# Patient Record
Sex: Female | Born: 1950 | Race: White | Hispanic: No | Marital: Married | Smoking: Former smoker
Health system: Southern US, Community
[De-identification: ages and names within clinical notes are randomized; demographics above are authoritative.]

## PROBLEM LIST (undated history)

## (undated) DIAGNOSIS — F419 Anxiety disorder, unspecified: Secondary | ICD-10-CM

## (undated) DIAGNOSIS — J449 Chronic obstructive pulmonary disease, unspecified: Secondary | ICD-10-CM

## (undated) DIAGNOSIS — R0602 Shortness of breath: Secondary | ICD-10-CM

## (undated) DIAGNOSIS — R0600 Dyspnea, unspecified: Secondary | ICD-10-CM

## (undated) DIAGNOSIS — K219 Gastro-esophageal reflux disease without esophagitis: Secondary | ICD-10-CM

## (undated) DIAGNOSIS — I509 Heart failure, unspecified: Secondary | ICD-10-CM

## (undated) DIAGNOSIS — I429 Cardiomyopathy, unspecified: Secondary | ICD-10-CM

## (undated) DIAGNOSIS — J45909 Unspecified asthma, uncomplicated: Secondary | ICD-10-CM

## (undated) DIAGNOSIS — M1990 Unspecified osteoarthritis, unspecified site: Secondary | ICD-10-CM

## (undated) DIAGNOSIS — C801 Malignant (primary) neoplasm, unspecified: Secondary | ICD-10-CM

## (undated) DIAGNOSIS — I493 Ventricular premature depolarization: Secondary | ICD-10-CM

## (undated) DIAGNOSIS — A419 Sepsis, unspecified organism: Secondary | ICD-10-CM

## (undated) DIAGNOSIS — F32A Depression, unspecified: Secondary | ICD-10-CM

## (undated) DIAGNOSIS — E785 Hyperlipidemia, unspecified: Secondary | ICD-10-CM

## (undated) DIAGNOSIS — C3490 Malignant neoplasm of unspecified part of unspecified bronchus or lung: Secondary | ICD-10-CM

## (undated) DIAGNOSIS — S24103A Unspecified injury at T7-T10 level of thoracic spinal cord, initial encounter: Secondary | ICD-10-CM

## (undated) DIAGNOSIS — F329 Major depressive disorder, single episode, unspecified: Secondary | ICD-10-CM

## (undated) DIAGNOSIS — I10 Essential (primary) hypertension: Secondary | ICD-10-CM

## (undated) DIAGNOSIS — J189 Pneumonia, unspecified organism: Secondary | ICD-10-CM

## (undated) HISTORY — DX: Unspecified injury at T7-t10 level of thoracic spinal cord, initial encounter: S24.103A

## (undated) HISTORY — DX: Malignant (primary) neoplasm, unspecified: C80.1

## (undated) HISTORY — PX: CATARACT EXTRACTION: SUR2

## (undated) HISTORY — DX: Essential (primary) hypertension: I10

## (undated) HISTORY — PX: LAMINOTOMY: SHX998

## (undated) HISTORY — DX: Ventricular premature depolarization: I49.3

## (undated) HISTORY — DX: Hyperlipidemia, unspecified: E78.5

## (undated) HISTORY — DX: Cardiomyopathy, unspecified: I42.9

## (undated) HISTORY — DX: Pneumonia, unspecified organism: J18.9

## (undated) HISTORY — DX: Unspecified asthma, uncomplicated: J45.909

## (undated) HISTORY — DX: Shortness of breath: R06.02

## (undated) HISTORY — DX: Sepsis, unspecified organism: A41.9

## (undated) HISTORY — DX: Chronic obstructive pulmonary disease, unspecified: J44.9

## (undated) HISTORY — PX: APPENDECTOMY: SHX54

## (undated) HISTORY — PX: CARDIAC CATHETERIZATION: SHX172

## (undated) HISTORY — PX: COLONOSCOPY: SHX174

---

## 2004-01-01 ENCOUNTER — Ambulatory Visit: Payer: Self-pay | Admitting: Physician Assistant

## 2004-05-07 ENCOUNTER — Ambulatory Visit: Payer: Self-pay | Admitting: Physician Assistant

## 2004-09-01 ENCOUNTER — Ambulatory Visit: Payer: Self-pay | Admitting: Physician Assistant

## 2005-01-05 ENCOUNTER — Ambulatory Visit: Payer: Self-pay | Admitting: Physician Assistant

## 2005-05-10 ENCOUNTER — Ambulatory Visit: Payer: Self-pay | Admitting: Physician Assistant

## 2005-08-12 ENCOUNTER — Ambulatory Visit: Payer: Self-pay | Admitting: Physician Assistant

## 2005-11-08 ENCOUNTER — Ambulatory Visit: Payer: Self-pay | Admitting: Physician Assistant

## 2006-01-04 ENCOUNTER — Ambulatory Visit: Payer: Self-pay | Admitting: Pain Medicine

## 2006-05-04 ENCOUNTER — Ambulatory Visit: Payer: Self-pay | Admitting: Physician Assistant

## 2006-06-06 ENCOUNTER — Ambulatory Visit: Payer: Self-pay | Admitting: Pain Medicine

## 2006-08-07 ENCOUNTER — Ambulatory Visit: Payer: Self-pay | Admitting: Physician Assistant

## 2006-09-07 ENCOUNTER — Ambulatory Visit: Payer: Self-pay | Admitting: Pain Medicine

## 2006-09-26 ENCOUNTER — Ambulatory Visit: Payer: Self-pay | Admitting: Physician Assistant

## 2006-11-06 ENCOUNTER — Ambulatory Visit: Payer: Self-pay | Admitting: Physician Assistant

## 2007-02-12 ENCOUNTER — Ambulatory Visit: Payer: Self-pay | Admitting: Physician Assistant

## 2007-05-15 ENCOUNTER — Ambulatory Visit: Payer: Self-pay | Admitting: Physician Assistant

## 2007-05-31 ENCOUNTER — Ambulatory Visit: Payer: Self-pay | Admitting: Pain Medicine

## 2007-06-27 ENCOUNTER — Ambulatory Visit: Payer: Self-pay | Admitting: Pain Medicine

## 2007-09-13 ENCOUNTER — Ambulatory Visit: Payer: Self-pay | Admitting: Physician Assistant

## 2007-12-12 ENCOUNTER — Ambulatory Visit: Payer: Self-pay | Admitting: Physician Assistant

## 2008-03-12 ENCOUNTER — Ambulatory Visit: Payer: Self-pay | Admitting: Physician Assistant

## 2008-06-17 ENCOUNTER — Ambulatory Visit: Payer: Self-pay | Admitting: Physician Assistant

## 2008-08-20 ENCOUNTER — Ambulatory Visit: Payer: Self-pay | Admitting: Gastroenterology

## 2008-09-16 ENCOUNTER — Ambulatory Visit: Payer: Self-pay | Admitting: Gastroenterology

## 2008-09-17 ENCOUNTER — Ambulatory Visit: Payer: Self-pay | Admitting: Physician Assistant

## 2008-10-23 ENCOUNTER — Ambulatory Visit: Payer: Self-pay | Admitting: Physician Assistant

## 2009-01-13 ENCOUNTER — Ambulatory Visit: Payer: Self-pay | Admitting: Family Medicine

## 2009-01-26 ENCOUNTER — Ambulatory Visit: Payer: Self-pay | Admitting: Family Medicine

## 2009-03-17 ENCOUNTER — Ambulatory Visit: Payer: Self-pay | Admitting: Cardiothoracic Surgery

## 2009-03-18 ENCOUNTER — Ambulatory Visit: Payer: Self-pay | Admitting: Cardiothoracic Surgery

## 2009-09-23 ENCOUNTER — Ambulatory Visit: Payer: Self-pay | Admitting: Pain Medicine

## 2009-10-01 ENCOUNTER — Ambulatory Visit: Payer: Self-pay | Admitting: Pain Medicine

## 2009-10-29 ENCOUNTER — Ambulatory Visit: Payer: Self-pay | Admitting: Pain Medicine

## 2009-11-04 ENCOUNTER — Ambulatory Visit: Payer: Self-pay | Admitting: Specialist

## 2009-11-05 ENCOUNTER — Ambulatory Visit: Payer: Self-pay | Admitting: Pain Medicine

## 2009-11-18 ENCOUNTER — Ambulatory Visit: Payer: Self-pay | Admitting: Pain Medicine

## 2010-03-10 ENCOUNTER — Ambulatory Visit: Payer: Self-pay | Admitting: Pain Medicine

## 2010-04-13 ENCOUNTER — Ambulatory Visit: Payer: Self-pay | Admitting: Pain Medicine

## 2010-05-25 ENCOUNTER — Other Ambulatory Visit: Payer: Self-pay | Admitting: Rheumatology

## 2010-09-21 ENCOUNTER — Ambulatory Visit: Payer: Self-pay | Admitting: Pain Medicine

## 2010-10-11 ENCOUNTER — Ambulatory Visit: Payer: Self-pay | Admitting: Pain Medicine

## 2011-06-29 ENCOUNTER — Ambulatory Visit: Payer: Self-pay | Admitting: Pain Medicine

## 2011-06-30 ENCOUNTER — Ambulatory Visit: Payer: Self-pay | Admitting: Pain Medicine

## 2011-08-05 ENCOUNTER — Other Ambulatory Visit: Payer: Self-pay | Admitting: Rheumatology

## 2011-08-05 LAB — SYNOVIAL CELL COUNT + DIFF, W/ CRYSTALS
Eosinophil: 0 %
Lymphocytes: 17 %
Neutrophils: 2 %
Nucleated Cell Count: 206 /mm3
Other Mononuclear Cells: 81 %

## 2012-11-26 ENCOUNTER — Ambulatory Visit: Payer: Self-pay | Admitting: Pain Medicine

## 2012-12-06 ENCOUNTER — Ambulatory Visit: Payer: Self-pay | Admitting: Pain Medicine

## 2012-12-31 ENCOUNTER — Ambulatory Visit: Payer: Self-pay | Admitting: Pain Medicine

## 2013-05-10 ENCOUNTER — Ambulatory Visit: Payer: Self-pay | Admitting: Ophthalmology

## 2013-05-10 DIAGNOSIS — J449 Chronic obstructive pulmonary disease, unspecified: Secondary | ICD-10-CM

## 2013-05-10 DIAGNOSIS — I428 Other cardiomyopathies: Secondary | ICD-10-CM

## 2013-05-22 ENCOUNTER — Ambulatory Visit: Payer: Self-pay | Admitting: Ophthalmology

## 2013-07-22 ENCOUNTER — Other Ambulatory Visit: Payer: Self-pay | Admitting: Pediatrics

## 2013-10-28 ENCOUNTER — Ambulatory Visit: Payer: Self-pay | Admitting: Pain Medicine

## 2013-10-31 ENCOUNTER — Ambulatory Visit: Payer: Self-pay | Admitting: Pain Medicine

## 2013-11-25 ENCOUNTER — Ambulatory Visit: Payer: Self-pay | Admitting: Pain Medicine

## 2014-02-21 ENCOUNTER — Emergency Department: Payer: Self-pay | Admitting: Emergency Medicine

## 2014-02-21 LAB — COMPREHENSIVE METABOLIC PANEL
ANION GAP: 11 (ref 7–16)
AST: 29 U/L (ref 15–37)
Albumin: 4 g/dL (ref 3.4–5.0)
Alkaline Phosphatase: 110 U/L
BILIRUBIN TOTAL: 0.4 mg/dL (ref 0.2–1.0)
BUN: 11 mg/dL (ref 7–18)
CHLORIDE: 106 mmol/L (ref 98–107)
Calcium, Total: 9.2 mg/dL (ref 8.5–10.1)
Co2: 21 mmol/L (ref 21–32)
Creatinine: 0.89 mg/dL (ref 0.60–1.30)
EGFR (African American): >60
EGFR (Non-African Amer.): >60
Glucose: 121 mg/dL — ABNORMAL HIGH (ref 65–99)
OSMOLALITY: 276 (ref 275–301)
POTASSIUM: 3.7 mmol/L (ref 3.5–5.1)
SGPT (ALT): 38 U/L
Sodium: 138 mmol/L (ref 136–145)
TOTAL PROTEIN: 7.8 g/dL (ref 6.4–8.2)

## 2014-02-21 LAB — CBC WITH DIFFERENTIAL/PLATELET
Basophil #: 0.1 10*3/uL (ref 0.0–0.1)
Basophil %: 0.6 %
EOS ABS: 0.1 10*3/uL (ref 0.0–0.7)
Eosinophil %: 0.6 %
HCT: 45.5 % (ref 35.0–47.0)
HGB: 15.3 g/dL (ref 12.0–16.0)
LYMPHS PCT: 24.5 %
Lymphocyte #: 2.7 10*3/uL (ref 1.0–3.6)
MCH: 31.5 pg (ref 26.0–34.0)
MCHC: 33.6 g/dL (ref 32.0–36.0)
MCV: 94 fL (ref 80–100)
MONO ABS: 0.7 x10 3/mm (ref 0.2–0.9)
Monocyte %: 6.5 %
NEUTROS ABS: 7.3 10*3/uL — AB (ref 1.4–6.5)
Neutrophil %: 67.8 %
Platelet: 247 10*3/uL (ref 150–440)
RBC: 4.84 10*6/uL (ref 3.80–5.20)
RDW: 13.5 % (ref 11.5–14.5)
WBC: 10.8 10*3/uL (ref 3.6–11.0)

## 2014-02-21 LAB — URINALYSIS, COMPLETE
BILIRUBIN, UR: NEGATIVE
Blood: NEGATIVE
Glucose,UR: NEGATIVE mg/dL (ref 0–75)
Leukocyte Esterase: NEGATIVE
Nitrite: NEGATIVE
Ph: 5 (ref 4.5–8.0)
Protein: NEGATIVE
SPECIFIC GRAVITY: 1.024 (ref 1.003–1.030)

## 2014-02-21 LAB — LIPASE, BLOOD: LIPASE: 225 U/L (ref 73–393)

## 2014-03-13 ENCOUNTER — Other Ambulatory Visit (HOSPITAL_COMMUNITY): Payer: Self-pay | Admitting: Cardiology

## 2014-03-13 DIAGNOSIS — R000 Tachycardia, unspecified: Secondary | ICD-10-CM

## 2014-03-13 DIAGNOSIS — R0602 Shortness of breath: Secondary | ICD-10-CM

## 2014-03-20 ENCOUNTER — Other Ambulatory Visit: Payer: 59

## 2014-03-31 ENCOUNTER — Ambulatory Visit: Payer: 59 | Admitting: Cardiovascular Disease

## 2014-04-03 ENCOUNTER — Other Ambulatory Visit: Payer: 59

## 2014-04-07 ENCOUNTER — Ambulatory Visit: Payer: 59 | Admitting: Cardiovascular Disease

## 2014-05-08 ENCOUNTER — Ambulatory Visit (INDEPENDENT_AMBULATORY_CARE_PROVIDER_SITE_OTHER): Payer: 59 | Admitting: Cardiovascular Disease

## 2014-05-08 ENCOUNTER — Encounter: Payer: Self-pay | Admitting: Cardiovascular Disease

## 2014-05-08 ENCOUNTER — Encounter (INDEPENDENT_AMBULATORY_CARE_PROVIDER_SITE_OTHER): Payer: Self-pay

## 2014-05-08 VITALS — BP 120/70 | HR 67 | Ht 65.0 in | Wt 172.5 lb

## 2014-05-08 DIAGNOSIS — I420 Dilated cardiomyopathy: Secondary | ICD-10-CM | POA: Insufficient documentation

## 2014-05-08 DIAGNOSIS — I493 Ventricular premature depolarization: Secondary | ICD-10-CM | POA: Insufficient documentation

## 2014-05-08 DIAGNOSIS — R0602 Shortness of breath: Secondary | ICD-10-CM

## 2014-05-08 DIAGNOSIS — J449 Chronic obstructive pulmonary disease, unspecified: Secondary | ICD-10-CM | POA: Diagnosis not present

## 2014-05-08 DIAGNOSIS — E669 Obesity, unspecified: Secondary | ICD-10-CM | POA: Diagnosis not present

## 2014-05-08 DIAGNOSIS — R000 Tachycardia, unspecified: Secondary | ICD-10-CM | POA: Insufficient documentation

## 2014-05-08 HISTORY — DX: Shortness of breath: R06.02

## 2014-05-08 MED ORDER — METOPROLOL SUCCINATE ER 100 MG PO TB24: 200.0000 mg | ORAL_TABLET | Freq: Every day | ORAL | Status: DC

## 2014-05-08 NOTE — Assessment & Plan Note (Signed)
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 Uncertain if alcohol was playing a factor in her low ejection fraction at the time. Repeat echocardiogram has been ordered

## 2014-05-08 NOTE — Assessment & Plan Note (Signed)
She has underlying COPD from long history of smoking. She has nebulizers and inhalers. Resolving bronchitis on today's visit

## 2014-05-08 NOTE — Patient Instructions (Addendum)
You are doing well.  Please call when you would like to schedule the stress test  Neb treatments for shortness of breath  Keep the echo appt on Monday  Please call us if you have new issues that need to be addressed before your next appt.  Your physician wants you to follow-up in: 6 months.  You will receive a reminder letter in the mail two months in advance. If you don't receive a letter, please call our office to schedule the follow-up appointment.  [place]  Your caregiver has ordered a Stress Test with nuclear imaging. The purpose of this test is to evaluate the blood supply to your heart muscle. This procedure is referred to as a "Non-Invasive Stress Test." This is because other than having an IV started in your vein, nothing is inserted or "invades" your body. Cardiac stress tests are done to find areas of poor blood flow to the heart by determining the extent of coronary artery disease (CAD). Some patients exercise on a treadmill, which naturally increases the blood flow to your heart, while others who are  unable to walk on a treadmill due to physical limitations have a pharmacologic/chemical stress agent called Lexiscan . This medicine will mimic walking on a treadmill by temporarily increasing your coronary blood flow.   Please note: these test may take anywhere between 2-4 hours to complete  PLEASE REPORT TO [facility] AT THE FIRST DESK WILL DIRECT YOU WHERE TO GO  Date of Procedure:_____________________________________  Arrival Time for Procedure:______________________________  Instructions regarding medication:   __X__:  Hold betablocker(s) night before procedure and morning of procedure: METOPROLOL  PLEASE NOTIFY THE OFFICE AT LEAST 24 HOURS IN ADVANCE IF YOU ARE UNABLE TO KEEP YOUR APPOINTMENT.  [phone] AND  PLEASE NOTIFY NUCLEAR MEDICINE AT [facility] AT LEAST 24 HOURS IN ADVANCE IF YOU ARE UNABLE TO KEEP YOUR APPOINTMENT.  [phone]  How to prepare for your Myoview test:  1. Do not eat or drink after midnight 2. No caffeine for 24 hours prior to test 3. No smoking 24 hours prior to test. 4. Your medication may be taken with water.  If your doctor stopped a medication because of this test, do not take that medication. 5. Ladies, please do not wear dresses.  Skirts or pants are appropriate. Please wear a short sleeve shirt. 6. No perfume, cologne or lotion. 7. Wear comfortable walking shoes. No heels!

## 2014-05-08 NOTE — Assessment & Plan Note (Signed)
Etiology of her shortness of breath is likely multifactorial. She has underlying COPD, resolving bronchitis with rails appreciated today, obesity, deconditioning. Unable to exclude systolic dysfunction, cardiomyopathy. Also unable to rule out ischemia given her long smoking history. This was discussed with her in detail. She has echocardiogram scheduled for several days from now to evaluate ejection fraction, right heart pressures. We will also schedule a pharmacologic Myoview to rule out ischemia given her long smoking history. 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

## 2014-05-08 NOTE — Progress Notes (Signed)
Patient ID: Grace [name], female    DOB: 1950-10-12, 64 y.o.   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 Records are not available at this time. She is concerned as she is been having worsening shortness of breath.  She has had 2 rounds of bronchitis one in November 2015, again February 2016 both treated with antibiotics and prednisone. Symptoms seem to have resolved but she reports having significant shortness of breath walking into the office today, had to stop twice.  She does report having baseline tachycardia with heart rates 120. She has been slowly increasing the metoprolol dose on her own, now takes 300 mg daily. She reports this does cause fatigue. She has had 30 pound weight gain since November 2015, possibly from inactivity, illness, prednisone. She does not think she has fluid retention.  She continues to smoke one half pack per day, has smoked for several decades. She does have a cough, upper respiratory, nonproductive.  EKG on today's visit shows normal sinus rhythm with rate 67 bpm, nonspecific ST abnormality    Allergies  Allergen Reactions  . Iodine Anaphylaxis    Outpatient Encounter Prescriptions as of 05/08/2014  Medication Sig  . albuterol (PROVENTIL HFA;VENTOLIN HFA) 108 (90 BASE) MCG/ACT inhaler Inhale 2 puffs into the lungs every 4 (four) hours as needed.   . ARIPiprazole (ABILIFY) 5 MG tablet Take 5 mg by mouth daily.  . DULoxetine (CYMBALTA) 60 MG capsule TAKE ONE CAPSULE BY MOUTH DAILY  . Fluticasone-Salmeterol (ADVAIR) 100-50 MCG/DOSE AEPB Inhale 1 puff into the lungs as needed.  . Ipratropium-Albuterol (COMBIVENT) 20-100 MCG/ACT AERS respimat Inhale 1 puff into the  lungs every 6 (six) hours as needed.   . metoprolol succinate (TOPROL-XL) 100 MG 24 hr tablet Take 2 tablets (200 mg total) by mouth daily.  . pantoprazole (PROTONIX) 40 MG tablet Take 40 mg by mouth 2 (two) times daily.   . [DISCONTINUED] metoprolol succinate (TOPROL-XL) 100 MG 24 hr tablet Take 300 mg by mouth daily.   . [DISCONTINUED] Armodafinil 150 MG tablet Take 150 mg by mouth daily.     Past Medical History  Diagnosis Date  . Cardiomyopathy   . Hypertension   . Hyperlipidemia   . Asthma   . COPD (chronic obstructive pulmonary disease)   . PVC's (premature ventricular contractions)     Past Surgical History  Procedure Laterality Date  . Cardiac catheterization    . Cesarean section    . Appendectomy    . Cataract extraction      right eye     Social History  reports that she has been smoking Cigarettes.  She has a 25 pack-year smoking history. She does not have any smokeless tobacco history on file. She reports that she does not drink alcohol or use illicit drugs.  Family History family history includes Heart attack (age of onset: 57) in her father.  Review of Systems  Constitutional: Negative.   HENT: Negative.   Respiratory: Positive for cough and shortness of breath.   Cardiovascular: Negative.   Gastrointestinal: Negative.   Musculoskeletal: Negative.   Skin: Negative.   Neurological: Negative.   Hematological: Negative.   Psychiatric/Behavioral: Negative.   All other systems reviewed and are negative.  BP 120/70 mmHg  Pulse 67  Ht 5\' 5"  (1.651 m)  Wt 172 lb 8 oz (78.245 kg)  BMI 28.71 kg/m2  Physical Exam  Constitutional: She is oriented to person, place, and time. She appears well-developed and well-nourished.  HENT:  Head: Normocephalic.  Nose: Nose normal.  Mouth/Throat: Oropharynx is clear and moist.  Eyes: Conjunctivae are normal. Pupils are equal, round, and reactive to light.  Neck: Normal range of motion. Neck supple. No JVD present.   Cardiovascular: Normal rate, regular rhythm, S1 normal, S2 normal, normal heart sounds and intact distal pulses.  Exam reveals no gallop and no friction rub.   No murmur heard. Pulmonary/Chest: Effort normal. No respiratory distress. She has wheezes. She has rales. She exhibits no tenderness.  Abdominal: Soft. Bowel sounds are normal. She exhibits no distension. There is no tenderness.  Musculoskeletal: Normal range of motion. She exhibits no edema or tenderness.  Lymphadenopathy:    She has no cervical adenopathy.  Neurological: She is alert and oriented to person, place, and time. Coordination normal.  Skin: Skin is warm and dry. No rash noted. No erythema.  Psychiatric: She has a normal mood and affect. Her behavior is normal. Judgment and thought content normal.    Assessment and Plan  Nursing note and vitals reviewed.

## 2014-05-08 NOTE — Assessment & Plan Note (Signed)
Recent 30 pound weight gain from inactivity and prednisone. We have encouraged continued exercise, careful diet management in an effort to lose weight.

## 2014-05-08 NOTE — Assessment & Plan Note (Signed)
She is on metoprolol succinate 300 mg daily. Suggested she decrease this back to 200 mg daily as there is likely little benefit of additional medication above this does. She reports that she feels better, breathing is better on higher dose metoprolol when heart rate is slow. At baseline has sinus tachycardia with heart rate more than 100 contribute to shortness of breath.

## 2014-05-12 ENCOUNTER — Other Ambulatory Visit: Payer: 59

## 2014-06-07 NOTE — Op Note (Signed)
PATIENT NAME:  Grace [name], Grace [name] MR#:  [id number] DATE OF BIRTH:  01-23-1951  DATE OF PROCEDURE:  05/22/2013  PREOPERATIVE DIAGNOSIS:  Senile cataract right eye.  POSTOPERATIVE DIAGNOSIS:  Senile cataract right eye.  PROCEDURE:  Phacoemulsification with posterior chamber intraocular lens implantation of the right eye.  LENS:  ZCB00 23.5-diopter posterior chamber intraocular lens.  ULTRASOUND TIME:  21% of 1 minute, 16 seconds.  CDE 16.2  SURGEON:  Mali Eyonna Sandstrom, MD  ANESTHESIA:  Topical with tetracaine drops and 2% Xylocaine jelly.  COMPLICATIONS:  None.  DESCRIPTION OF PROCEDURE:  The patient was identified in the holding room and transported to the operating room and placed in the supine position under the operating microscope.  The right eye was identified as the operative eye and it was prepped and draped in the usual sterile ophthalmic fashion.  A 1 millimeter clear-corneal paracentesis was made at the 12 o'clock position.  The anterior chamber was filled with Viscoat viscoelastic.  A 2.4 millimeter keratome was used to make a near-clear corneal incision at the 9 o'clock position.  A curvilinear capsulorrhexis was made with a cystotome and capsulorrhexis forceps.  Balanced salt solution was used to hydrodissect and hydrodelineate the nucleus.  Phacoemulsification was then used in stop and chop fashion to remove the lens nucleus and epinucleus.  The remaining cortex was then removed using the irrigation and aspiration handpiece. Provisc was then placed into the capsular bag to distend it for lens placement.  A ZCB00 23.5-diopter lens was then injected into the capsular bag.  The remaining viscoelastic was aspirated.  Wounds were hydrated with balanced salt solution.  The anterior chamber was inflated to a physiologic pressure with balanced salt solution.  0.1 mL of cefuroxime 10 mg/mL were injected into the anterior chamber for a dose of 1 mg of intracameral antibiotic at the completion  of the case. Miostat was placed into the anterior chamber to constrict the pupil.  No wound leaks were noted.  Topical Vigamox drops and Maxitrol ointment were applied to the eye.  The patient was taken to the recovery room in stable condition without complications of anesthesia or surgery.  ____________________________ Wyonia Hough, MD crb:lb D: 05/22/2013 13:47:22 ET T: 05/22/2013 14:04:42 ET JOB#: [id number]  cc: Wyonia Hough, MD, <Dictator> Leandrew Koyanagi MD ELECTRONICALLY SIGNED 05/29/2013 14:51

## 2014-06-30 ENCOUNTER — Emergency Department: Payer: 59

## 2014-06-30 ENCOUNTER — Inpatient Hospital Stay
Admission: EM | Admit: 2014-06-30 | Discharge: 2014-07-02 | DRG: 871 | Disposition: A | Discharge status: Home / Self Care | Payer: 59 | Attending: Internal Medicine | Admitting: Internal Medicine

## 2014-06-30 DIAGNOSIS — J40 Bronchitis, not specified as acute or chronic: Secondary | ICD-10-CM | POA: Diagnosis present

## 2014-06-30 DIAGNOSIS — Z791 Long term (current) use of non-steroidal anti-inflammatories (NSAID): Secondary | ICD-10-CM

## 2014-06-30 DIAGNOSIS — K219 Gastro-esophageal reflux disease without esophagitis: Secondary | ICD-10-CM | POA: Diagnosis present

## 2014-06-30 DIAGNOSIS — G629 Polyneuropathy, unspecified: Secondary | ICD-10-CM | POA: Diagnosis present

## 2014-06-30 DIAGNOSIS — J440 Chronic obstructive pulmonary disease with acute lower respiratory infection: Secondary | ICD-10-CM | POA: Diagnosis present

## 2014-06-30 DIAGNOSIS — F329 Major depressive disorder, single episode, unspecified: Secondary | ICD-10-CM | POA: Diagnosis present

## 2014-06-30 DIAGNOSIS — J189 Pneumonia, unspecified organism: Secondary | ICD-10-CM

## 2014-06-30 DIAGNOSIS — E785 Hyperlipidemia, unspecified: Secondary | ICD-10-CM | POA: Diagnosis present

## 2014-06-30 DIAGNOSIS — I10 Essential (primary) hypertension: Secondary | ICD-10-CM | POA: Diagnosis present

## 2014-06-30 DIAGNOSIS — M549 Dorsalgia, unspecified: Secondary | ICD-10-CM | POA: Diagnosis present

## 2014-06-30 DIAGNOSIS — Z7952 Long term (current) use of systemic steroids: Secondary | ICD-10-CM

## 2014-06-30 DIAGNOSIS — A419 Sepsis, unspecified organism: Principal | ICD-10-CM | POA: Diagnosis present

## 2014-06-30 DIAGNOSIS — F419 Anxiety disorder, unspecified: Secondary | ICD-10-CM | POA: Diagnosis present

## 2014-06-30 DIAGNOSIS — I429 Cardiomyopathy, unspecified: Secondary | ICD-10-CM | POA: Diagnosis present

## 2014-06-30 DIAGNOSIS — J449 Chronic obstructive pulmonary disease, unspecified: Secondary | ICD-10-CM | POA: Diagnosis present

## 2014-06-30 DIAGNOSIS — F17210 Nicotine dependence, cigarettes, uncomplicated: Secondary | ICD-10-CM | POA: Diagnosis present

## 2014-06-30 DIAGNOSIS — J441 Chronic obstructive pulmonary disease with (acute) exacerbation: Secondary | ICD-10-CM | POA: Diagnosis present

## 2014-06-30 DIAGNOSIS — F32A Depression, unspecified: Secondary | ICD-10-CM | POA: Diagnosis present

## 2014-06-30 DIAGNOSIS — Z79899 Other long term (current) drug therapy: Secondary | ICD-10-CM

## 2014-06-30 DIAGNOSIS — Z7951 Long term (current) use of inhaled steroids: Secondary | ICD-10-CM | POA: Diagnosis not present

## 2014-06-30 DIAGNOSIS — G8929 Other chronic pain: Secondary | ICD-10-CM | POA: Diagnosis present

## 2014-06-30 DIAGNOSIS — R001 Bradycardia, unspecified: Secondary | ICD-10-CM | POA: Diagnosis present

## 2014-06-30 DIAGNOSIS — R52 Pain, unspecified: Secondary | ICD-10-CM

## 2014-06-30 HISTORY — DX: Pneumonia, unspecified organism: J18.9

## 2014-06-30 HISTORY — DX: Sepsis, unspecified organism: A41.9

## 2014-06-30 LAB — COMPREHENSIVE METABOLIC PANEL
ALT: 31 U/L (ref 14–54)
ANION GAP: 10 (ref 5–15)
AST: 25 U/L (ref 15–41)
Albumin: 3.8 g/dL (ref 3.5–5.0)
Alkaline Phosphatase: 59 U/L (ref 38–126)
BILIRUBIN TOTAL: 0.5 mg/dL (ref 0.3–1.2)
BUN: 17 mg/dL (ref 6–20)
CO2: 30 mmol/L (ref 22–32)
CREATININE: 0.73 mg/dL (ref 0.44–1.00)
Calcium: 9 mg/dL (ref 8.9–10.3)
Chloride: 102 mmol/L (ref 101–111)
GFR calc non Af Amer: >60 mL/min (ref >60)
GLUCOSE: 106 mg/dL — AB (ref 65–99)
POTASSIUM: 4 mmol/L (ref 3.5–5.1)
Sodium: 142 mmol/L (ref 135–145)
TOTAL PROTEIN: 6.5 g/dL (ref 6.5–8.1)

## 2014-06-30 LAB — CBC WITH DIFFERENTIAL/PLATELET
Basophils Absolute: 0 10*3/uL (ref 0–0.1)
Basophils Relative: 0 %
EOS ABS: 0 10*3/uL (ref 0–0.7)
Eosinophils Relative: 0 %
HCT: 44.4 % (ref 35.0–47.0)
Hemoglobin: 15 g/dL (ref 12.0–16.0)
LYMPHS ABS: 1.1 10*3/uL (ref 1.0–3.6)
LYMPHS PCT: 7 %
MCH: 31.9 pg (ref 26.0–34.0)
MCHC: 33.8 g/dL (ref 32.0–36.0)
MCV: 94.3 fL (ref 80.0–100.0)
MONOS PCT: 4 %
Monocytes Absolute: 0.6 10*3/uL (ref 0.2–0.9)
NEUTROS PCT: 89 %
Neutro Abs: 14.4 10*3/uL — ABNORMAL HIGH (ref 1.4–6.5)
PLATELETS: 233 10*3/uL (ref 150–440)
RBC: 4.71 MIL/uL (ref 3.80–5.20)
RDW: 14.1 % (ref 11.5–14.5)
WBC: 16.2 10*3/uL — ABNORMAL HIGH (ref 3.6–11.0)

## 2014-06-30 LAB — URINALYSIS COMPLETE WITH MICROSCOPIC (ARMC ONLY)
Bilirubin Urine: NEGATIVE
Glucose, UA: NEGATIVE mg/dL
Hgb urine dipstick: NEGATIVE
Ketones, ur: NEGATIVE mg/dL
Leukocytes, UA: NEGATIVE
Nitrite: NEGATIVE
PH: 6 (ref 5.0–8.0)
PROTEIN: NEGATIVE mg/dL
RBC / HPF: NONE SEEN RBC/hpf (ref 0–5)
Specific Gravity, Urine: 1.008 (ref 1.005–1.030)

## 2014-06-30 LAB — EXPECTORATED SPUTUM ASSESSMENT W GRAM STAIN, RFLX TO RESP C

## 2014-06-30 LAB — CREATININE, SERUM
CREATININE: 0.81 mg/dL (ref 0.44–1.00)
GFR calc Af Amer: >60 mL/min (ref >60)
GFR calc non Af Amer: >60 mL/min (ref >60)

## 2014-06-30 LAB — BASIC METABOLIC PANEL
ANION GAP: 9 (ref 5–15)
BUN: 16 mg/dL (ref 6–20)
CALCIUM: 8.5 mg/dL — AB (ref 8.9–10.3)
CHLORIDE: 100 mmol/L — AB (ref 101–111)
CO2: 29 mmol/L (ref 22–32)
Creatinine, Ser: 0.8 mg/dL (ref 0.44–1.00)
GFR calc Af Amer: >60 mL/min (ref >60)
GFR calc non Af Amer: >60 mL/min (ref >60)
GLUCOSE: 314 mg/dL — AB (ref 65–99)
Potassium: 3.9 mmol/L (ref 3.5–5.1)
Sodium: 138 mmol/L (ref 135–145)

## 2014-06-30 LAB — CBC
HCT: 42.5 % (ref 35.0–47.0)
HEMOGLOBIN: 13.8 g/dL (ref 12.0–16.0)
MCH: 31 pg (ref 26.0–34.0)
MCHC: 32.5 g/dL (ref 32.0–36.0)
MCV: 95.6 fL (ref 80.0–100.0)
PLATELETS: 208 10*3/uL (ref 150–440)
RBC: 4.44 MIL/uL (ref 3.80–5.20)
RDW: 14.4 % (ref 11.5–14.5)
WBC: 14 10*3/uL — ABNORMAL HIGH (ref 3.6–11.0)

## 2014-06-30 LAB — BLOOD GAS, VENOUS
Acid-Base Excess: 8.2 mmol/L — ABNORMAL HIGH (ref 0.0–3.0)
BICARBONATE: 31.9 meq/L — AB (ref 21.0–28.0)
PCO2 VEN: 40 mmHg — AB (ref 44.0–60.0)
PH VEN: 7.51 — AB (ref 7.320–7.430)
Patient temperature: 37

## 2014-06-30 LAB — TROPONIN I: Troponin I: <0.03 ng/mL (ref <0.031)

## 2014-06-30 LAB — EXPECTORATED SPUTUM ASSESSMENT W REFEX TO RESP CULTURE

## 2014-06-30 MED ORDER — OXYCODONE HCL 20 MG/ML PO CONC: 10.0000 mg | Freq: Four times a day (QID) | ORAL | Status: DC | PRN

## 2014-06-30 MED ORDER — IPRATROPIUM-ALBUTEROL 0.5-2.5 (3) MG/3ML IN SOLN: 3.0000 mL | RESPIRATORY_TRACT | Status: DC | PRN

## 2014-06-30 MED ORDER — VANCOMYCIN HCL IN DEXTROSE 1-5 GM/200ML-% IV SOLN
1000.0000 mg | INTRAVENOUS | Status: DC
  Administered 2014-06-30: 1000 mg via INTRAVENOUS

## 2014-06-30 MED ORDER — IPRATROPIUM-ALBUTEROL 0.5-2.5 (3) MG/3ML IN SOLN
RESPIRATORY_TRACT | Status: AC
  Filled 2014-06-30: qty 9

## 2014-06-30 MED ORDER — VANCOMYCIN HCL IN DEXTROSE 1-5 GM/200ML-% IV SOLN
INTRAVENOUS | Status: AC
  Filled 2014-06-30: qty 200

## 2014-06-30 MED ORDER — SODIUM CHLORIDE 0.9 % IV SOLN
INTRAVENOUS | Status: DC
  Administered 2014-06-30: 06:00:00 via INTRAVENOUS

## 2014-06-30 MED ORDER — METHYLPREDNISOLONE SODIUM SUCC 125 MG IJ SOLR
125.0000 mg | Freq: Once | INTRAMUSCULAR | Status: AC
  Administered 2014-06-30: 125 mg via INTRAVENOUS

## 2014-06-30 MED ORDER — IPRATROPIUM-ALBUTEROL 0.5-2.5 (3) MG/3ML IN SOLN
3.0000 mL | Freq: Once | RESPIRATORY_TRACT | Status: AC
  Administered 2014-06-30: 3 mL via RESPIRATORY_TRACT

## 2014-06-30 MED ORDER — LORAZEPAM 0.5 MG PO TABS: 0.5000 mg | ORAL_TABLET | Freq: Three times a day (TID) | ORAL | Status: DC | PRN

## 2014-06-30 MED ORDER — METHYLPREDNISOLONE SODIUM SUCC 125 MG IJ SOLR
INTRAMUSCULAR | Status: AC
  Filled 2014-06-30: qty 2

## 2014-06-30 MED ORDER — MAGNESIUM SULFATE 2 GM/50ML IV SOLN
INTRAVENOUS | Status: AC
  Filled 2014-06-30: qty 50

## 2014-06-30 MED ORDER — SODIUM CHLORIDE 0.9 % IJ SOLN
3.0000 mL | Freq: Two times a day (BID) | INTRAMUSCULAR | Status: DC
  Administered 2014-06-30 – 2014-07-01 (×3): 3 mL via INTRAVENOUS

## 2014-06-30 MED ORDER — DEXTROSE 5 % IV SOLN
500.0000 mg | Freq: Once | INTRAVENOUS | Status: DC
  Administered 2014-06-30: 500 mg via INTRAVENOUS

## 2014-06-30 MED ORDER — MAGNESIUM SULFATE 2 GM/50ML IV SOLN
2.0000 g | Freq: Once | INTRAVENOUS | Status: AC
  Administered 2014-06-30: 2 g via INTRAVENOUS

## 2014-06-30 MED ORDER — METOPROLOL TARTRATE 100 MG PO TABS
100.0000 mg | ORAL_TABLET | Freq: Every day | ORAL | Status: DC
  Administered 2014-06-30 – 2014-07-01 (×2): 100 mg via ORAL
  Filled 2014-06-30 (×2): qty 1

## 2014-06-30 MED ORDER — DEXTROSE 5 % IV SOLN
INTRAVENOUS | Status: AC
  Filled 2014-06-30: qty 500

## 2014-06-30 MED ORDER — OXYCODONE-ACETAMINOPHEN 5-325 MG PO TABS
2.0000 | ORAL_TABLET | Freq: Four times a day (QID) | ORAL | Status: DC | PRN
  Administered 2014-06-30 – 2014-07-02 (×8): 2 via ORAL
  Filled 2014-06-30 (×9): qty 2

## 2014-06-30 MED ORDER — PIPERACILLIN-TAZOBACTAM 3.375 G IVPB 30 MIN: 3.3750 g | Freq: Three times a day (TID) | INTRAVENOUS | Status: DC

## 2014-06-30 MED ORDER — VANCOMYCIN HCL IN DEXTROSE 1-5 GM/200ML-% IV SOLN
1000.0000 mg | INTRAVENOUS | Status: AC
Start: 2014-06-30 — End: 2014-07-01

## 2014-06-30 MED ORDER — OXYCODONE HCL 5 MG PO TABS
5.0000 mg | ORAL_TABLET | ORAL | Status: DC | PRN
  Administered 2014-06-30 – 2014-07-02 (×8): 5 mg via ORAL
  Filled 2014-06-30 (×8): qty 1

## 2014-06-30 MED ORDER — HYDROCODONE-ACETAMINOPHEN 7.5-325 MG/15ML PO SOLN
10.0000 mL | Freq: Once | ORAL | Status: AC
  Administered 2014-06-30: 10 mL via ORAL

## 2014-06-30 MED ORDER — VANCOMYCIN HCL IN DEXTROSE 1-5 GM/200ML-% IV SOLN
1000.0000 mg | Freq: Two times a day (BID) | INTRAVENOUS | Status: DC
  Administered 2014-06-30 – 2014-07-01 (×4): 1000 mg via INTRAVENOUS
  Filled 2014-06-30 (×7): qty 200

## 2014-06-30 MED ORDER — DULOXETINE HCL 60 MG PO CPEP
60.0000 mg | ORAL_CAPSULE | Freq: Every day | ORAL | Status: DC
  Administered 2014-06-30 – 2014-07-02 (×3): 60 mg via ORAL
  Filled 2014-06-30 (×3): qty 1

## 2014-06-30 MED ORDER — PIPERACILLIN-TAZOBACTAM 3.375 G IVPB 30 MIN: 3.3750 g | INTRAVENOUS | Status: AC

## 2014-06-30 MED ORDER — PIPERACILLIN-TAZOBACTAM 3.375 G IVPB
3.3750 g | Freq: Three times a day (TID) | INTRAVENOUS | Status: DC
  Administered 2014-06-30 – 2014-07-02 (×6): 3.375 g via INTRAVENOUS
  Filled 2014-06-30 (×10): qty 50

## 2014-06-30 MED ORDER — OXYCODONE HCL 5 MG/5ML PO SOLN
10.0000 mg | Freq: Four times a day (QID) | ORAL | Status: DC | PRN
  Administered 2014-06-30: 10 mg via ORAL
  Filled 2014-06-30: qty 10

## 2014-06-30 MED ORDER — ALBUTEROL SULFATE (2.5 MG/3ML) 0.083% IN NEBU: 3.0000 mL | INHALATION_SOLUTION | RESPIRATORY_TRACT | Status: DC | PRN

## 2014-06-30 MED ORDER — MORPHINE SULFATE 2 MG/ML IJ SOLN
INTRAMUSCULAR | Status: AC
  Filled 2014-06-30: qty 1

## 2014-06-30 MED ORDER — ENOXAPARIN SODIUM 40 MG/0.4ML ~~LOC~~ SOLN
40.0000 mg | SUBCUTANEOUS | Status: DC
  Administered 2014-07-01 – 2014-07-02 (×2): 40 mg via SUBCUTANEOUS
  Filled 2014-06-30 (×4): qty 0.4

## 2014-06-30 MED ORDER — MORPHINE SULFATE 2 MG/ML IJ SOLN
2.0000 mg | Freq: Once | INTRAMUSCULAR | Status: AC
  Administered 2014-06-30: 2 mg via INTRAVENOUS

## 2014-06-30 MED ORDER — ARIPIPRAZOLE 5 MG PO TABS
5.0000 mg | ORAL_TABLET | Freq: Every day | ORAL | Status: DC
  Administered 2014-07-01 – 2014-07-02 (×2): 5 mg via ORAL
  Filled 2014-06-30 (×3): qty 1

## 2014-06-30 MED ORDER — MOMETASONE FURO-FORMOTEROL FUM 100-5 MCG/ACT IN AERO
2.0000 | INHALATION_SPRAY | Freq: Two times a day (BID) | RESPIRATORY_TRACT | Status: DC
  Administered 2014-06-30 – 2014-07-01 (×3): 2 via RESPIRATORY_TRACT
  Filled 2014-06-30 (×2): qty 8.8

## 2014-06-30 NOTE — ED Notes (Signed)
Pt updated regarding admission process. Pt provided with applesauce by house supervisor. Pt verbalizes understanding of admission process.

## 2014-06-30 NOTE — Progress Notes (Signed)
Date: 06/30/2014,   MRN# [id number] Grace [name] 06/14/1950 Code Status:     Code Status Orders        Start     Ordered   06/30/14 0521  Full code   Continuous     06/30/14 0520     Hosp day:@LENGTHOFSTAYDAYS @ Referring MD: @ATDPROV @        AdmissionWeight: 186 lb (84.369 kg)                 CurrentWeight: 186 lb (84.369 kg)  CC: sob, wheezing  HPI: This is a 77 year was here By EMS for  increase sob, could not speak in full sentences, tachy, leukocytosis.  Treated aggrresively and since being here < 24 hours she is feeling better. She mentioned she has been on chronic prednisone as as high 80 mg/day (self medications).    PMHX:   Past Medical History  Diagnosis Date  . Cardiomyopathy   . Hypertension   . Hyperlipidemia   . Asthma   . COPD (chronic obstructive pulmonary disease)   . PVC's (premature ventricular contractions)    Surgical Hx:  Past Surgical History  Procedure Laterality Date  . Cardiac catheterization    . Cesarean section    . Appendectomy    . Cataract extraction      right eye    Family Hx:  Family History  Problem Relation Age of Onset  . Heart attack Father 77    MI x 3    Social Hx:   History  Substance Use Topics  . Smoking status: Current Every Day Smoker -- 0.50 packs/day for 50 years    Types: Cigarettes  . Smokeless tobacco: Not on file  . Alcohol Use: No   Medication:   See list  Current Medication: @CURMEDTAB @   Allergies:  Iodine  Review of Systems: Gen:  Denies  fever, sweats, chills HEENT: Denies blurred vision, double vision, ear pain, eye pain, hearing loss, nose bleeds, sore throat Cvc:  No dizziness, chest pain or heaviness Resp: Wheezing and sob is btter  Gi: Denies swallowing difficulty, stomach pain, nausea or vomiting, diarrhea, constipation, bowel incontinence Gu:  Denies bladder incontinence, burning urine Ext:   No Joint pain, stiffness or swelling Skin: No skin rash, easy bruising or bleeding or  hives Endoc:  No polyuria, polydipsia , polyphagia or weight change Psych: No depression, insomnia or hallucinations  Other:  All other systems negative  Physical Examination:   VS: BP 132/68 mmHg  Pulse 66  Temp(Src) 97.7 F (36.5 C) (Oral)  Resp 18  Ht 5\' 6"  (1.676 m)  Wt 186 lb (84.369 kg)  BMI 30.04 kg/m2  SpO2 99%  General Appearance: No distress, sitting up in chair Psych: Mental status, speech normal, alert and oriented Neuro: cranial nerves 2-12 intact, reflexes normal and symmetric, sensation grossly normal  HEENT: PERRLA, EOM intact, no ptosis, no other lesions noticed Neck ; Supple, no stridor, nodes Pulmonary:.Rare wheezing, coarse, no rales    Cardiovascular:  Normal S1,S2.  No m/r/g.  Abdominal aorta pulsation normal.    Abdomen:Benign, Soft, non-tender, No masses, hepatosplenomegaly, No lymphadenopathy Endoc: No evident thyromegaly, no signs of acromegaly or Cushing features Skin:   warm, no rashes, no ecchymosis, right mid chest raised tiny skin lesion  Extremities: normal, no cyanosis, clubbing, no edema, warm with normal capillary refill.   Labs results:   Recent Labs     06/30/14  0100  06/30/14  0532  HGB  15.0  13.8  HCT  44.4  42.5  MCV  94.3  95.6  WBC  16.2*  14.0*  BUN  17  16  CREATININE  0.73  0.81  0.80  GLUCOSE  106*  314*  CALCIUM  9.0  8.5*  ,    Culture results:     Rad results:  CLINICAL DATA: Acute onset of shortness of breath and difficulty breathing. Initial encounter.  EXAM: CHEST 1 VIEW  COMPARISON: Chest radiograph performed 04/08/2013, and CT of the chest performed 11/04/2009  FINDINGS: The lungs are well-aerated. Minimal left basilar atelectasis is noted. There is no evidence of pleural effusion or pneumothorax.  The cardiomediastinal silhouette is borderline normal in size. No acute osseous abnormalities are seen.  IMPRESSION: Minimal left basilar atelectasis noted; lungs otherwise clear.  1:25 AM       pH, Ven 7.320 - 7.430  7.51 (H)   pCO2, Ven 44.0 - 60.0 mmHg 40 (L)   Bicarbonate 21.0 - 28.0 mEq/L 31.9 (H)   Acid-Base Excess 0.0 - 3.0 mmol/L 8.2 (H)   Patient temperature  37.0   Drawn by  AB   Sample type  VENOUS            CBC (Order [id number])      CBC  Status: Finalresult Visible to patient:  Not Released Nextappt: None              Ref Range 5:32 AM       WBC 3.6 - 11.0 K/uL 14.0 (H) 16.2 (H)    RBC 3.80 - 5.20 MIL/uL 4.44 4.71    Hemoglobin 12.0 - 16.0 g/dL 13.8 15.0    HCT 35.0 - 47.0 % 42.5 44.4    MCV 80.0 - 100.0 fL 95.6 94.3    MCH 26.0 - 34.0 pg 31.0 31.9    MCHC 32.0 - 36.0 g/dL 32.5 33.8    RDW 11.5 - 14.5 % 14.4 14.1    Platelets 150 - 440 K/uL 208 233   Resulting Agency  [facility]      Specimen Collected: 06/30/14 5:32 AM Last Resulted: 06/30/14 5:41 AM                      Result Information      Status    Abnormal Final result (06/30/2014 5:41 AM)    Provider Status: Ordered                                        Released By: April Jason Fila, RN (auto-released)  Authorizing: Lance Coon, MD   Date: 06/30/2014  Department: [place] (2A)       Order Information     Order Date/Time Release Date/Time Start Date/Time End Date/Time    06/30/14 05:20 AM 06/30/14 05:20 AM 06/30/14 05:21 AM 06/30/14 05:21 AM      Order Details     Frequency Duration Priority Order Class    Once 1 occurrence STAT Lab Collect      Acc#    [id number]      Order History  Inpatient    Date/Time Action Taken User Additional Information    06/30/14 0520 Release April B Brumgard, RN (auto-released) [phone]    06/30/14 0538 Result Lab In [place] In process    06/30/14 0541 Result Lab In [employer] Final  Order Requisition     CBC (Order #[id number]) on 06/30/14      Collection Information     Collected: 06/30/2014 5:32 AM   Resulting Agency: [facility]     CBC (Order #[id number]) on 06/30/14           Comprehensive metabolic panel (Order [id number])      Comprehensive metabolic panel  Status: Finalresult Visible to patient:  Not Released Nextappt: None            Newer results are available. Click to view them now.        Ref Range 7mo ago    Glucose 65-99 mg/dL 121 (H)   BUN 7-18 mg/dL 11   Creatinine 0.60-1.30 mg/dL 0.89   Sodium 136-145 mmol/L 138   Potassium 3.5-5.1 mmol/L 3.7   Chloride 98-107 mmol/L 106   Co2 21-32 mmol/L 21   Calcium, Total 8.5-10.1 mg/dL 9.2   SGOT(AST) 15-37 Unit/L 29   SGPT (ALT) U/L 38   Comments: 14-63  NOTE: New Reference Range  09/03/13     Alkaline Phosphatase Unit/L 110   Comments: 46-116  NOTE: New Reference Range  09/03/13     Albumin 3.4-5.0 g/dL 4.0   Total Protein 6.4-8.2 g/dL 7.8   Bilirubin,Total 0.2-1.0 mg/dL 0.4   Osmolality 275-301  276   Anion Gap 7-16  11   EGFR (African American) >60mL/min  >60   EGFR (Non-African Amer.) >22mL/min  >60   Comments: eGFR values <2mL/min/1.73 m2 may be an indication of chronic  kidney disease (CKD).  Calculated eGFR, using the MRDR Study equation, is useful in  patients with stable renal function.  The eGFR calculation will not be reliable in acutely ill patients  when serum creatinine is changing rapidly. It is not useful in  patients on dialysis. The eGFR calculation may not be applicable  to patients at the low and high extremes of body sizes, pregnant  women, and vegetarians.     Resulting Agency [facility]       Specimen Collected: 02/21/14 3:58 AM Last Resulted: 02/21/14 4:24 AM                      Other Results from 02/21/2014         Urinalysis, Complete  Status: Finalresult Visible to patient:  Not Released Nextappt:  None         Newer results are available. Click to view them now.        Ref Range 51mo ago    Color - urine  Yellow   Clarity - urine  Clear   Glucose,UR 0-75 mg/dL Negative   Bilirubin,UR NEGATIVE  Negative   Ketone NEGATIVE  Trace   Specific Gravity 1.003-1.030  1.024   Blood NEGATIVE  Negative   Ph 4.5-8.0  5.0   Protein NEGATIVE  Negative   Nitrite NEGATIVE  Negative   Leukocyte Esterase NEGATIVE  Negative   RBC,UR 0-5 /HPF 1 /HPF   WBC UR 0-5 /HPF 3 /HPF   Bacteria NONE SEEN  TRACE   Squamous Epithelial  1 /HPF   Mucous  PRESENT   Resulting Agency [facility]       Specimen Collected: 02/21/14 5:10 AM Last Resulted: 02/21/14 5:56 AM  Assessment and Plan: 1Came in with increase sob, wheezing, tachy, leukocytosis and ? RML infiltrate. So far with your efforts she has made increase improvement. I doubt impending sepsis. Her elevated wbc is due to steroids.  Unfortunately she still smokes.   2 Mid t-spine pain  3 Keratotic like lesion on back, r/o malignancy  4 Nicotine abuse  Plan -advised strongly to stop smoking -continue present regimen -our task is to determine the appropriate regimen (singulair, symbicort, albuterol, prednisone at a minimum) -IGE level -dvt prophylaxis -pain control -dermatology consult on admission. -reassess in the am  I have personally obtained a history, examined the patient, evaluated laboratory and imaging results, formulated the assessment and plan and placed orders.  The Patient requires high complexity decision making for assessment and support, frequent evaluation and titration of therapies, application of advanced monitoring technologies and extensive interpretation of multiple databases.   Jaely Silman,M.D. Pulmonary & Critical care Medicine [facility]

## 2014-06-30 NOTE — ED Notes (Signed)
Pt to ED via EMS c/o [facility].

## 2014-06-30 NOTE — ED Notes (Signed)
Pt requesting increase in frequency of prescribed pain medication. Dr. reddy notified, order for morphine '2mg'$  iv for pain now received. Pt provided with additional apple sauce, po fluids and placed in hospital bed for comfort.

## 2014-06-30 NOTE — Progress Notes (Signed)
ANTIBIOTIC CONSULT NOTE - INITIAL  Pharmacy Consult for Vancomycin/Zosyn Indication: CAP, r/o sepsis  Allergies  Allergen Reactions  . Iodine Anaphylaxis    Patient Measurements: Height: '5\' 6"'$  (167.6 cm) Weight: 186 lb (84.369 kg) IBW/kg (Calculated) : 59.3 Adjusted Body Weight: 69.3 kg  Vital Signs: BP: 125/65 mmHg (05/16 0430) Pulse Rate: 85 (05/16 0430) Intake/Output from previous day:   Intake/Output from this shift:    Labs:  Recent Labs  06/30/14 0100 06/30/14 0532  WBC 16.2* 14.0*  HGB 15.0 13.8  PLT 233 208  CREATININE 0.73 0.80   Estimated Creatinine Clearance: 78.7 mL/min (by C-G formula based on Cr of 0.8). No results for input(s): VANCOTROUGH, VANCOPEAK, VANCORANDOM, GENTTROUGH, GENTPEAK, GENTRANDOM, TOBRATROUGH, TOBRAPEAK, TOBRARND, AMIKACINPEAK, AMIKACINTROU, AMIKACIN in the last 72 hours.   Microbiology: No results found for this or any previous visit (from the past 720 hour(s)).  Medical History: Past Medical History  Diagnosis Date  . Cardiomyopathy   . Hypertension   . Hyperlipidemia   . Asthma   . COPD (chronic obstructive pulmonary disease)   . PVC's (premature ventricular contractions)     Medications:  Scheduled:  . DULoxetine  60 mg Oral Daily  . enoxaparin (LOVENOX) injection  40 mg Subcutaneous Q24H  . metoprolol  100 mg Oral Daily  . mometasone-formoterol  2 puff Inhalation BID  . sodium chloride  3 mL Intravenous Q12H   Infusions:  . sodium chloride 75 mL/hr at 06/30/14 0615  . piperacillin-tazobactam    . piperacillin-tazobactam (ZOSYN)  IV    . vancomycin     Assessment: Empiric abx for CAP, r/o sepsis.   Goal of Therapy:  Vancomycin trough level 15-20 mcg/ml  Plan:  1. Vancomycin 1000 mg iv q 12 h with stacked dosing and a trough with the 5th dose.   2. Zosyn 3.375 g iv once then 3.375 g iv q 8 h EI.   F/U culture results.    Ulice Dash D 06/30/2014,6:34 AM

## 2014-06-30 NOTE — ED Provider Notes (Signed)
[facility] Emergency Department Provider Note  ____________________________________________  Time seen: Approximately 0041 AM  I have reviewed the triage vital signs and the nursing notes.   HISTORY  Chief Complaint Shortness of Breath    HPI Grace [name] is a 64 y.o. female with a history of cardiomyopathy and COPD who reports that she's been having a lot of flares of her COPD recently. The patient reports that since October she has been on steroids for a significantly long time. He reports that she has been taking 60-80 mg of prednisone daily because of the shortness of breath she's been having. The patient reports that tonight she was walking to the bathroom and became acutely short of breath. She reports that she took prednisone 80 mg as well as some albuterol through meter dose inhaler. She reports that she waited for a short while but the breathing did not improve so she called EMS and brought in. The patient reports that she could not get any air. The patient denies any fevers or chest pain. She reports thatshe has had a cough productive of creamy white phlegm. The patient reports that she does have a history of chronic back pain that has been flaring up worse tonight with the cough.   Past Medical History  Diagnosis Date  . Cardiomyopathy   . Hypertension   . Hyperlipidemia   . Asthma   . COPD (chronic obstructive pulmonary disease)   . PVC's (premature ventricular contractions)     Patient Active Problem List   Diagnosis Date Noted  . PVC's (premature ventricular contractions) 05/08/2014  . SOB (shortness of breath) 05/08/2014  . COPD (chronic obstructive pulmonary disease) with chronic bronchitis 05/08/2014  . Obesity 05/08/2014  . Tachycardia 05/08/2014  . 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 Cardiac catheterization    . Cesarean section    . Appendectomy    . Cataract  extraction      right eye     Current Outpatient Rx  Name  Route  Sig  Dispense  Refill  . Albuterol Sulfate (VENTOLIN HFA IN)   Inhalation   Inhale 2 puffs into the lungs QID.         Marland Kitchen Albuterol Sulfate (VENTOLIN HFA IN)   Inhalation   Inhale 2 puffs into the lungs every 4 (four) hours as needed.         . ARIPiprazole (ABILIFY) 5 MG tablet   Oral   Take 5 mg by mouth daily.         . DULoxetine (CYMBALTA) 60 MG capsule   Oral   Take 60 mg by mouth daily.         . Fluticasone-Salmeterol (ADVAIR) 250-50 MCG/DOSE AEPB   Inhalation   Inhale 2 puffs into the lungs 2 (two) times daily.         Marland Kitchen gabapentin (NEURONTIN) 300 MG capsule   Oral   Take 300 mg by mouth 4 (four) times daily as needed.         Marland Kitchen ibuprofen (ADVIL,MOTRIN) 100 MG/5ML suspension   Oral   Take 800 mg by mouth every 6 (six) hours as needed.         Marland Kitchen LORazepam (ATIVAN) 0.5 MG tablet   Oral   Take 0.5 mg by mouth every 8 (eight) hours as needed for anxiety.         . metoprolol (LOPRESSOR) 100 MG tablet   Oral  Take 100 mg by mouth daily.         Marland Kitchen moxifloxacin (AVELOX) 400 MG tablet   Oral   Take 400 mg by mouth daily.         . Omega-3 Fatty Acids (FISH OIL) 1200 MG CAPS   Oral   Take by mouth daily.         Marland Kitchen oxyCODONE (ROXICODONE INTENSOL) 20 MG/ML concentrated solution   Oral   Take 15 mg by mouth every 6 (six) hours as needed for severe pain.         . pantoprazole (PROTONIX) 40 MG tablet   Oral   Take 40 mg by mouth 2 (two) times daily.         . predniSONE (DELTASONE) 20 MG tablet   Oral   Take 20 mg by mouth daily with breakfast.         . varenicline (CHANTIX) 1 MG tablet   Oral   Take 1 mg by mouth 2 (two) times daily.         Marland Kitchen albuterol (PROVENTIL HFA;VENTOLIN HFA) 108 (90 BASE) MCG/ACT inhaler   Inhalation   Inhale 2 puffs into the lungs every 4 (four) hours as needed.          . ARIPiprazole (ABILIFY) 5 MG tablet   Oral   Take 5 mg  by mouth daily.         . DULoxetine (CYMBALTA) 60 MG capsule      TAKE ONE CAPSULE BY MOUTH DAILY         . Fluticasone-Salmeterol (ADVAIR) 100-50 MCG/DOSE AEPB   Inhalation   Inhale 1 puff into the lungs as needed.         . Ipratropium-Albuterol (COMBIVENT) 20-100 MCG/ACT AERS respimat   Inhalation   Inhale 1 puff into the lungs every 6 (six) hours as needed.          . metoprolol succinate (TOPROL-XL) 100 MG 24 hr tablet   Oral   Take 2 tablets (200 mg total) by mouth daily.   180 tablet   3   . pantoprazole (PROTONIX) 40 MG tablet   Oral   Take 40 mg by mouth 2 (two) times daily.            Allergies Iodine  Family History  Problem Relation Age of Onset  . Heart attack Father 38    MI x 3     Social History History  Substance Use Topics  . Smoking status: Current Every Day Smoker -- 0.50 packs/day for 50 years    Types: Cigarettes  . Smokeless tobacco: Not on file  . Alcohol Use: No    Review of Systems Constitutional: No fever/chills Eyes: No visual changes. ENT: No sore throat. Cardiovascular: Denies chest pain. Respiratory:  shortness of breath. Gastrointestinal: No abdominal pain.  No nausea, no vomiting.  Genitourinary: Negative for dysuria. Musculoskeletal: back pain. Skin: Negative for rash. Neurological: Negative for headaches or numbness. 10-point ROS otherwise negative.  ____________________________________________   PHYSICAL EXAM:  VITAL SIGNS: ED Triage Vitals  Enc Vitals Group     BP 06/30/14 0046 178/104 mmHg     Pulse Rate 06/30/14 0046 78     Resp 06/30/14 0046 22     Temp --      Temp src --      SpO2 06/30/14 0046 97 %     Weight 06/30/14 0046 186 lb (84.369 kg)     Height 06/30/14 0046 5'  6" (1.676 m)     Head Cir --      Peak Flow --      Pain Score 06/30/14 0048 6     Pain Loc --      Pain Edu? --      Excl. in [place]? --     Constitutional: Alert and oriented. Well appearing and in moderate to severe  distress. Eyes: Conjunctivae are normal. PERRL. EOMI. Head: Atraumatic. Nose: No congestion/rhinnorhea. Mouth/Throat: Mucous membranes are moist.  Oropharynx non-erythematous. Cardiovascular: Normal rate, regular rhythm. Grossly normal heart sounds.  Good peripheral circulation. Respiratory: Tachypnea with increased work of breathing, retractions, expiratory wheezes throughout all lung fields. Gastrointestinal: Soft and nontender. No distention. Positive bowel sounds Genitourinary: Deferred Musculoskeletal: No lower extremity tenderness nor edema.  No joint effusions. Neurologic:  Normal speech and language. No gross focal neurologic deficits are appreciated. Speech is normal.  Skin:  Skin is warm, dry and intact. No rash noted. Psychiatric: Mood and affect are normal. Speech and behavior are normal.  ____________________________________________   LABS (all labs ordered are listed, but only abnormal results are displayed)  Labs Reviewed  CBC WITH DIFFERENTIAL/PLATELET - Abnormal; Notable for the following:    WBC 16.2 (*)    Neutro Abs 14.4 (*)    All other components within normal limits  COMPREHENSIVE METABOLIC PANEL - Abnormal; Notable for the following:    Glucose, Bld 106 (*)    All other components within normal limits  BLOOD GAS, VENOUS - Abnormal; Notable for the following:    pH, Ven 7.51 (*)    pCO2, Ven 40 (*)    Bicarbonate 31.9 (*)    Acid-Base Excess 8.2 (*)    All other components within normal limits  TROPONIN I   ____________________________________________  EKG  ED ECG REPORT   Date: 06/30/2014  EKG Time: 0046  Rate: 80  Rhythm: normal sinus rhythm  Axis: Normal  Intervals:none  ST&T Change: None  ____________________________________________  RADIOLOGY  Chest xray: Minimal left basilar atelectasis, lungs otherwise clear ____________________________________________   PROCEDURES  Procedure(s) performed: None  Critical Care performed:  No  ____________________________________________   INITIAL IMPRESSION / ASSESSMENT AND PLAN / ED COURSE  Pertinent labs & imaging results that were available during my care of the patient were reviewed by me and considered in my medical decision making (see chart for details).  This is 63 year old female with a history of COPD who comes in tonight with significant shortness of breath and wheezing. The patient already took some steroids at home so I will hold off on giving her further steroids but I will treat the patient was doing nebs 3 doses 1. I will reassess the patient once she is received a DuoNeb and evaluate the patient's blood work.  After the 3 duo nebs the patient does still have some continued wheezing but has some improvement in her breathing. Although the patient's blood work does not show any significant CO2 retention given the patient's wheezing and elevated white blood cell count there is a concern she may have bronchitis. I will admit the patient as she does still have a significantly increased work of breathing and significant wheezes throughout. The patient will receive a dose of magnesium sulfate 2 g IV 1 and azithromycin 500 mg IV 1.  ____________________________________________   FINAL CLINICAL IMPRESSION(S) / ED DIAGNOSES  Final diagnoses:  Bronchitis  COPD exacerbation       Loney Hering, MD 06/30/14 [phone]

## 2014-06-30 NOTE — H&P (Signed)
[place] [facility] [place] NAME: Grace [name]    MR#:  [id number]  DATE OF BIRTH:  27-Jan-1951  DATE OF ADMISSION:  06/30/2014  PRIMARY CARE PHYSICIAN: [name], MD   REQUESTING/REFERRING PHYSICIAN: Dahlia Client  CHIEF COMPLAINT:   Chief Complaint  Patient presents with  . Shortness of Breath    HISTORY OF PRESENT ILLNESS:  Grace [name]  is a 64 y.o. female who presents with significantly increased shortness of breath. Patient has known COPD, and states that she has been on high doses of prednisone fairly consistently for the last 6 months or so. Her pulmonologist is Dr. Raul Del. She states that over the past couple days her shortness of breath was getting progressively worse again, and that she was having an increased amount of whitish sputum. She denies any overt fevers or chills. She states that she took increased dose of prednisone at home, but that her shortness of breath just progressed, so she came to the ED. In the ED she was found to be in mild respiratory distress, tachypneic, and found to have an elevated white count of 16.9. This is possibly due to her recent steroid use, though based on physical exam she clinically could have an underlying pneumonia as well. In the ED she was given IV steroids magnesium, nebs, with some improvement in her respiratory status. Hospitalists were called for COPD exacerbation, with possible community acquired pneumonia. Given her tachypnea and elevated white count, she technically meets sepsis criteria.  PAST MEDICAL HISTORY:   Past Medical History  Diagnosis Date  . Cardiomyopathy   . Hypertension   . Hyperlipidemia   . Asthma   . COPD (chronic obstructive pulmonary disease)   . PVC's (premature ventricular contractions)     PAST SURGICAL HISTORY:   Past Surgical History  Procedure Laterality Date  . Cardiac catheterization    . Cesarean section    . Appendectomy    . Cataract extraction       right eye     SOCIAL HISTORY:   History  Substance Use Topics  . Smoking status: Current Every Day Smoker -- 0.50 packs/day for 50 years    Types: Cigarettes  . Smokeless tobacco: Not on file  . Alcohol Use: No    FAMILY HISTORY:   Family History  Problem Relation Age of Onset  . Heart attack Father 52    MI x 3     DRUG ALLERGIES:   Allergies  Allergen Reactions  . Iodine Anaphylaxis    MEDICATIONS AT HOME:   Prior to Admission medications   Medication Sig Start Date End Date Taking? Authorizing Provider  Albuterol Sulfate (VENTOLIN HFA IN) Inhale 2 puffs into the lungs QID.   Yes Historical Provider, MD  Albuterol Sulfate (VENTOLIN HFA IN) Inhale 2 puffs into the lungs every 4 (four) hours as needed.   Yes Historical Provider, MD  ARIPiprazole (ABILIFY) 5 MG tablet Take 5 mg by mouth daily.   Yes Historical Provider, MD  DULoxetine (CYMBALTA) 60 MG capsule Take 60 mg by mouth daily.   Yes Historical Provider, MD  Fluticasone-Salmeterol (ADVAIR) 250-50 MCG/DOSE AEPB Inhale 2 puffs into the lungs 2 (two) times daily.   Yes Historical Provider, MD  gabapentin (NEURONTIN) 300 MG capsule Take 300 mg by mouth 4 (four) times daily as needed.   Yes Historical Provider, MD  ibuprofen (ADVIL,MOTRIN) 100 MG/5ML suspension Take 800 mg by mouth every 6 (six) hours as needed.  Yes Historical Provider, MD  LORazepam (ATIVAN) 0.5 MG tablet Take 0.5 mg by mouth every 8 (eight) hours as needed for anxiety.   Yes Historical Provider, MD  metoprolol (LOPRESSOR) 100 MG tablet Take 100 mg by mouth daily.   Yes Historical Provider, MD  moxifloxacin (AVELOX) 400 MG tablet Take 400 mg by mouth daily.   Yes Historical Provider, MD  Omega-3 Fatty Acids (FISH OIL) 1200 MG CAPS Take by mouth daily.   Yes Historical Provider, MD  oxyCODONE (ROXICODONE INTENSOL) 20 MG/ML concentrated solution Take 15 mg by mouth every 6 (six) hours as needed for severe pain.   Yes Historical Provider, MD   pantoprazole (PROTONIX) 40 MG tablet Take 40 mg by mouth 2 (two) times daily.   Yes Historical Provider, MD  predniSONE (DELTASONE) 20 MG tablet Take 20 mg by mouth daily with breakfast.   Yes Historical Provider, MD  varenicline (CHANTIX) 1 MG tablet Take 1 mg by mouth 2 (two) times daily.   Yes Historical Provider, MD  albuterol (PROVENTIL HFA;VENTOLIN HFA) 108 (90 BASE) MCG/ACT inhaler Inhale 2 puffs into the lungs every 4 (four) hours as needed.  12/23/13   Historical Provider, MD  ARIPiprazole (ABILIFY) 5 MG tablet Take 5 mg by mouth daily.    Historical Provider, MD  DULoxetine (CYMBALTA) 60 MG capsule TAKE ONE CAPSULE BY MOUTH DAILY 04/09/14   Historical Provider, MD  Fluticasone-Salmeterol (ADVAIR) 100-50 MCG/DOSE AEPB Inhale 1 puff into the lungs as needed.    Historical Provider, MD  Ipratropium-Albuterol (COMBIVENT) 20-100 MCG/ACT AERS respimat Inhale 1 puff into the lungs every 6 (six) hours as needed.  12/23/13 12/23/14  Historical Provider, MD  metoprolol succinate (TOPROL-XL) 100 MG 24 hr tablet Take 2 tablets (200 mg total) by mouth daily. 05/08/14 05/08/15  Minna Merritts, MD  pantoprazole (PROTONIX) 40 MG tablet Take 40 mg by mouth 2 (two) times daily.  11/20/13   Historical Provider, MD    REVIEW OF SYSTEMS:  Review of Systems  Constitutional: Negative for fever, chills, weight loss and malaise/fatigue.  HENT: Negative for ear pain, hearing loss and tinnitus.   Eyes: Negative for blurred vision, double vision, pain and redness.  Respiratory: Positive for cough, sputum production, shortness of breath and wheezing. Negative for hemoptysis.   Cardiovascular: Negative for chest pain, palpitations, orthopnea and leg swelling.  Gastrointestinal: Negative for nausea, vomiting, abdominal pain, diarrhea and constipation.  Genitourinary: Negative for dysuria, frequency and hematuria.  Musculoskeletal: Negative for back pain, joint pain and neck pain.  Skin:       No acne, rash, or  lesions  Neurological: Negative for dizziness, tremors, focal weakness and weakness.  Endo/Heme/Allergies: Negative for polydipsia. Does not bruise/bleed easily.  Psychiatric/Behavioral: Negative for depression. The patient is not nervous/anxious and does not have insomnia.      VITAL SIGNS:   Filed Vitals:   06/30/14 0315 06/30/14 0330 06/30/14 0400 06/30/14 0430  BP:  142/78 133/84 125/65  Pulse: 72 76 59 85  Resp: '16 17 14 16  '$ Height:      Weight:      SpO2: 92% 93% 94% 95%   Wt Readings from Last 3 Encounters:  06/30/14 84.369 kg (186 lb)  05/08/14 78.245 kg (172 lb 8 oz)    PHYSICAL EXAMINATION:  Physical Exam  Constitutional: She is oriented to person, place, and time. She appears well-developed and well-nourished. No distress.  HENT:  Head: Normocephalic and atraumatic.  Mouth/Throat: Oropharynx is clear and moist.  Eyes:  Conjunctivae and EOM are normal. Pupils are equal, round, and reactive to light. No scleral icterus.  Neck: Normal range of motion. Neck supple. No JVD present. No thyromegaly present.  Cardiovascular: Normal rate, regular rhythm and intact distal pulses.  Exam reveals no gallop and no friction rub.   No murmur heard. Respiratory: She is in respiratory distress (mild). She has wheezes. She has no rales.  Diffuse mild bilateral wheezing, with right-sided rhonchi, worst in the base  GI: Soft. Bowel sounds are normal. She exhibits no distension. There is no tenderness.  Musculoskeletal: Normal range of motion. She exhibits no edema.  No arthritis, no gout  Lymphadenopathy:    She has no cervical adenopathy.  Neurological: She is alert and oriented to person, place, and time. No cranial nerve deficit.  No dysarthria, no aphasia  Skin: Skin is warm and dry. No rash noted. No erythema.  Psychiatric: She has a normal mood and affect. Her behavior is normal. Judgment and thought content normal.    LABORATORY PANEL:   CBC  Recent Labs Lab  06/30/14 0100  WBC 16.2*  HGB 15.0  HCT 44.4  PLT 233   ------------------------------------------------------------------------------------------------------------------  Chemistries   Recent Labs Lab 06/30/14 0100  NA 142  K 4.0  CL 102  CO2 30  GLUCOSE 106*  BUN 17  CREATININE 0.73  CALCIUM 9.0  AST 25  ALT 31  ALKPHOS 59  BILITOT 0.5   ------------------------------------------------------------------------------------------------------------------  Cardiac Enzymes  Recent Labs Lab 06/30/14 0100  TROPONINI <0.03   ------------------------------------------------------------------------------------------------------------------  RADIOLOGY:  Dg Chest 1 View  06/30/2014   CLINICAL DATA:  Acute onset of shortness of breath and difficulty breathing. Initial encounter.  EXAM: CHEST  1 VIEW  COMPARISON:  Chest radiograph performed 04/08/2013, and CT of the chest performed 11/04/2009  FINDINGS: The lungs are well-aerated. Minimal left basilar atelectasis is noted. There is no evidence of pleural effusion or pneumothorax.  The cardiomediastinal silhouette is borderline normal in size. No acute osseous abnormalities are seen.  IMPRESSION: Minimal left basilar atelectasis noted; lungs otherwise clear.   Electronically Signed   By: Garald Balding M.D.   On: 06/30/2014 02:30    EKG:   Orders placed or performed in visit on 05/08/14  . EKG 12-Lead    IMPRESSION AND PLAN:  Principal Problem:   Sepsis - elevated white count (though this may be due to recent steroid use) with tachypnea, and possible community acquired pneumonia based on physical exam. Check a lactic acid, start some fluids, broad-spectrum antibiotics, sputum culture and blood cultures, check UA. Treat other underlying problems as below. Currently hemodynamically stable. Active Problems:   CAP (community acquired pneumonia) - broad antibiotics, sputum culture.   Depression - continue home meds.   GERD  (gastroesophageal reflux disease) - PPI while inpatient.   COPD (chronic obstructive pulmonary disease) with chronic bronchitis - patient's pulmonologist is Dr. Raul Del, we'll consult him for further recommendations given this patient's frequent exacerbations and high dose of steroids over the last several months.   All the records are reviewed and case discussed with ED provider. Management plans discussed with the patient and/or family.  DVT PROPHYLAXIS: SubQ lovenox  ADMISSION STATUS: Inpatient  CODE STATUS: Full  TOTAL TIME TAKING CARE OF THIS PATIENT: 45 minutes.    Brandy Kabat [place] 06/30/2014, 4:45 AM  [facility]  Office  [phone]  CC: Primary care physician; [name], MD

## 2014-06-30 NOTE — Progress Notes (Signed)
[place] at [facility]   PATIENT NAME: Grace [name]    MR#:  [id number]  DATE OF BIRTH:  01/16/1951  SUBJECTIVE: 64 year old female patient with heavy tobacco abuse,copd  on chronic steroids admitted for sepsis secondary to possible community-acquired pneumonia. Patient has COPD and follows up with Dr. Raul Del. She's been highdose prednisone for last several months. Started on vancomycin and Zosyn, day, steroids. Patient refusing Lovenox shots, IV fluids. She says she feels already better. She is not hypoxic and saturations on room air are 100%.   CHIEF COMPLAINT:   Chief Complaint  Patient presents with  . Shortness of Breath    REVIEW OF SYSTEMS:    Review of Systems  Constitutional: Negative for fever and chills.  HENT: Negative for hearing loss.   Eyes: Negative for blurred vision, double vision and photophobia.  Respiratory: Positive for cough, shortness of breath and wheezing. Negative for hemoptysis.   Cardiovascular: Negative for palpitations, orthopnea and leg swelling.  Gastrointestinal: Negative for vomiting, abdominal pain and diarrhea.  Genitourinary: Negative for dysuria and urgency.  Musculoskeletal: Negative for myalgias and neck pain.  Skin: Negative for rash.  Neurological: Negative for dizziness, focal weakness, seizures, weakness and headaches.  Psychiatric/Behavioral: Negative for memory loss. The patient does not have insomnia.     Nutritionlow sodium diet Tolerating Diet: Tolerating PT:      DRUG ALLERGIES:   Allergies  Allergen Reactions  . Iodine Anaphylaxis    VITALS:  Blood pressure 152/87, pulse 71, temperature 97.8 F (36.6 C), temperature source Oral, resp. rate 20, height '5\' 6"'$  (1.676 m), weight 84.369 kg (186 lb), SpO2 100 %.  PHYSICAL EXAMINATION:   Physical Exam  GENERAL:  64 y.o.-year-old patient lying in the bed with no acute distress.  EYES: Pupils equal, round, reactive to light and  accommodation. No scleral icterus. Extraocular muscles intact.  HEENT: Head atraumatic, normocephalic. Oropharynx and nasopharynx clear.  NECK:  Supple, no jugular venous distention. No thyroid enlargement, no tenderness.  LUNGS: Normal breath sounds bilaterally, no wheezing, rales,rhonchi or crepitation. No use of accessory muscles of respiration.  CARDIOVASCULAR: S1, S2 normal. No murmurs, rubs, or gallops.  ABDOMEN: Soft, nontender, nondistended. Bowel sounds present. No organomegaly or mass.  EXTREMITIES: No pedal edema, cyanosis, or clubbing.  NEUROLOGIC: Cranial nerves II through XII are intact. Muscle strength 5/5 in all extremities. Sensation intact. Gait not checked.  PSYCHIATRIC: The patient is alert and oriented x 3.  SKIN: No obvious rash, lesion, or ulcer.    LABORATORY PANEL:   CBC  Recent Labs Lab 06/30/14 0532  WBC 14.0*  HGB 13.8  HCT 42.5  PLT 208   ------------------------------------------------------------------------------------------------------------------  Chemistries   Recent Labs Lab 06/30/14 0100 06/30/14 0532  NA 142 138  K 4.0 3.9  CL 102 100*  CO2 30 29  GLUCOSE 106* 314*  BUN 17 16  CREATININE 0.73 0.81  0.80  CALCIUM 9.0 8.5*  AST 25  --   ALT 31  --   ALKPHOS 59  --   BILITOT 0.5  --    ------------------------------------------------------------------------------------------------------------------  Cardiac Enzymes  Recent Labs Lab 06/30/14 0100  TROPONINI <0.03   ------------------------------------------------------------------------------------------------------------------  RADIOLOGY:  Dg Chest 1 View  06/30/2014   CLINICAL DATA:  Acute onset of shortness of breath and difficulty breathing. Initial encounter.  EXAM: CHEST  1 VIEW  COMPARISON:  Chest radiograph performed 04/08/2013, and CT of the chest performed 11/04/2009  FINDINGS: The lungs are well-aerated. Minimal left  basilar atelectasis is noted. There is no  evidence of pleural effusion or pneumothorax.  The cardiomediastinal silhouette is borderline normal in size. No acute osseous abnormalities are seen.  IMPRESSION: Minimal left basilar atelectasis noted; lungs otherwise clear.   Electronically Signed   By: Garald Balding M.D.   On: 06/30/2014 02:30     ASSESSMENT AND PLAN:   1. Sepsis secondary to community-acquired pneumonia. Continue vancomycin, Zosyn. Continue her nebulizers, steroids. She feels better today possible discharge tomorrow with tapering dose of steroids and antibiotics. Follow up with Dr. Raul Del as an outpatient. [place]ulate is smoking. She is a heavy tobacco abuser. #2 chronic back pain use pain medications as needed. 3. Depression and anxiety continue Cymbalta, Abilify #4; hypertension and tachycardia patient has sinus tachycardia and diet control as well as metoprolol tartrate 100 MG daily. #5 neuropathy continue Neurontin 300 MG 4 times daily. Possible discharge tomorrow.     All the records are reviewed and case discussed with Care Management/Social Workerr. Management plans discussed with the patient, family and they are in agreement.  CODE STATUS: full   TOTAL TIME TAKING CARE OF THIS PATIENT: 40mnutes.   POSSIBLE D/C IN *1-2 DAYS, DEPENDING ON CLINICAL CONDITION.   KEpifanio LeschesM.D on 06/30/2014 at 10:05 AM  Between 7am to 6pm - Pager - [phone]  After 6pm go to [email or web address] - password [email or web address] [facility] E[facility] AuburnHospitalists  Office  [phone] CC: Primary care physician; HMaryland Pink MD

## 2014-06-30 NOTE — ED Notes (Signed)
Pt with improved resp effort and decreased tachypnea. Pt with improved wheezing in all lung fields.

## 2014-06-30 NOTE — ED Notes (Signed)
Pt with no wheezing auscultated at this time. Pt sleeping. Cont pox in place, nsr on cardiac monitor.

## 2014-06-30 NOTE — ED Notes (Signed)
Pt given breakfast tray

## 2014-06-30 NOTE — Progress Notes (Signed)
Patient is having back pain.  Dr. Raul Del paged at 1730 per patient request.  Per patient, she said he would give her additional pain medication.  Spoke with Dr. Raul Del and he said he was working on orders for his patients.    No new orders at this time.

## 2014-07-01 LAB — IGE: IgE (Immunoglobulin E), Serum: 418 IU/mL — ABNORMAL HIGH (ref 0–100)

## 2014-07-01 MED ORDER — METHOCARBAMOL 500 MG PO TABS
500.0000 mg | ORAL_TABLET | Freq: Four times a day (QID) | ORAL | Status: DC | PRN
  Administered 2014-07-01 – 2014-07-02 (×2): 500 mg via ORAL
  Filled 2014-07-01 (×3): qty 1

## 2014-07-01 MED ORDER — METOPROLOL TARTRATE 50 MG PO TABS
50.0000 mg | ORAL_TABLET | Freq: Every day | ORAL | Status: DC
  Administered 2014-07-02: 50 mg via ORAL
  Filled 2014-07-01: qty 1

## 2014-07-01 NOTE — Progress Notes (Signed)
[place] at [facility]   PATIENT NAME: Grace [name]    MR#:  [id number]  DATE OF BIRTH:  Jan 18, 1951  SUBJECTIVE: 64 year old female patient with heavy tobacco abuse,copd  on chronic steroids admitted for sepsis secondary to possible community-acquired pneumonia. Patient has COPD and follows up with Dr. Raul Del. She's been highdose prednisone for last several months. Started on vancomycin and Zosyn, day, steroids.  Complains of upper back pain today .  CHIEF COMPLAINT:   Chief Complaint  Patient presents with  . Shortness of Breath    REVIEW OF SYSTEMS:    Review of Systems  Constitutional: Negative for fever and chills.  HENT: Negative for hearing loss.   Eyes: Negative for blurred vision, double vision and photophobia.  Respiratory: Negative for cough, hemoptysis and shortness of breath.   Cardiovascular: Negative for palpitations, orthopnea and leg swelling.  Gastrointestinal: Negative for vomiting, abdominal pain and diarrhea.  Genitourinary: Negative for dysuria and urgency.  Musculoskeletal: Positive for back pain. Negative for myalgias and neck pain.  Skin: Negative for rash.  Neurological: Negative for dizziness, focal weakness, seizures, weakness and headaches.  Psychiatric/Behavioral: Negative for memory loss. The patient does not have insomnia.     Nutritionlow sodium diet Tolerating Diet: Tolerating PT:      DRUG ALLERGIES:   Allergies  Allergen Reactions  . Iodine Anaphylaxis    VITALS:  Blood pressure 152/71, pulse 59, temperature 98 F (36.7 C), temperature source Oral, resp. rate 18, height '5\' 6"'$  (1.676 m), weight 84.369 kg (186 lb), SpO2 97 %.  PHYSICAL EXAMINATION:   Physical Exam  GENERAL:  64 y.o.-year-old patient lying in the bed with no acute distress.  EYES: Pupils equal, round, reactive to light and accommodation. No scleral icterus. Extraocular muscles intact.  HEENT: Head atraumatic, normocephalic.  Oropharynx and nasopharynx clear.  NECK:  Supple, no jugular venous distention. No thyroid enlargement, no tenderness.  LUNGS: Normal breath sounds bilaterally, no wheezing, rales,rhonchi or crepitation. No use of accessory muscles of respiration.  CARDIOVASCULAR: S1, S2 normal. No murmurs, rubs, or gallops.  ABDOMEN: Soft, nontender, nondistended. Bowel sounds present. No organomegaly or mass.  EXTREMITIES: No pedal edema, cyanosis, or clubbing.  NEUROLOGIC: Cranial nerves II through XII are intact. Muscle strength 5/5 in all extremities. Sensation intact. Gait not checked.  PSYCHIATRIC: The patient is alert and oriented x 3.  SKIN: No obvious rash, lesion, or ulcer.   upper back tender to palpation but no weakness no numbness.  LABORATORY PANEL:   CBC  Recent Labs Lab 06/30/14 0532  WBC 14.0*  HGB 13.8  HCT 42.5  PLT 208   ------------------------------------------------------------------------------------------------------------------  Chemistries   Recent Labs Lab 06/30/14 0100 06/30/14 0532  NA 142 138  K 4.0 3.9  CL 102 100*  CO2 30 29  GLUCOSE 106* 314*  BUN 17 16  CREATININE 0.73 0.81  0.80  CALCIUM 9.0 8.5*  AST 25  --   ALT 31  --   ALKPHOS 59  --   BILITOT 0.5  --    ------------------------------------------------------------------------------------------------------------------  Cardiac Enzymes  Recent Labs Lab 06/30/14 0100  TROPONINI <0.03   ------------------------------------------------------------------------------------------------------------------  RADIOLOGY:  Dg Chest 1 View  06/30/2014   CLINICAL DATA:  Acute onset of shortness of breath and difficulty breathing. Initial encounter.  EXAM: CHEST  1 VIEW  COMPARISON:  Chest radiograph performed 04/08/2013, and CT of the chest performed 11/04/2009  FINDINGS: The lungs are well-aerated. Minimal left basilar atelectasis is noted. There  is no evidence of pleural effusion or pneumothorax.   The cardiomediastinal silhouette is borderline normal in size. No acute osseous abnormalities are seen.  IMPRESSION: Minimal left basilar atelectasis noted; lungs otherwise clear.   Electronically Signed   By: Garald Balding M.D.   On: 06/30/2014 02:30     ASSESSMENT AND PLAN:   1. Sepsis secondary to community-acquired pneumonia. Continue vancomycin, Zosyn. Continue her nebulizers, steroids.   feels better , continue vancomycin steroids and nebulizer for today and possibly changing to by mouth and then discharge home. #2 chronic back pain use pain medications as needed. Her  upper back pain more than usual , ordered x-ray of upper back to evaluate for any fractures secondary to chronic steroid use. Ordered a muscle relaxant with Robaxin. 3. Depression and anxiety continue Cymbalta, Abilify #4; hypertension , bradycardia ; resume her Toprol dose to 50 mg daily. #5 neuropathy continue Neurontin 300 MG 4 times daily. Possible discharge tomorrow.     All the records are reviewed and case discussed with Care Management/Social Workerr. Management plans discussed with the patient, family and they are in agreement.  CODE STATUS: full   TOTAL TIME TAKING CARE OF THIS PATIENT: 10mnutes.   POSSIBLE D/C IN *1-2 DAYS, DEPENDING ON CLINICAL CONDITION.   KEpifanio LeschesM.D on 07/01/2014 at 11:18 AM  Between 7am to 6pm - Pager - [phone]  After 6pm go to [email or web address] - password [email or web address] [facility] EEversonHospitalists  Office  [phone] CC: Primary care physician; HMaryland Pink MD

## 2014-07-01 NOTE — Progress Notes (Signed)
Date: 07/01/2014,   MRN# [id number] Grace [name] 1950/03/31 Code Status:     Code Status Orders        Start     Ordered   06/30/14 0521  Full code   Continuous     06/30/14 0520       HPI: This is a 74 year was here By EMS for increase sob, could not speak in full sentences, tachy, leukocytosis. Treated aggrresively and since being here  she is feeling better. She mentioned she has been on chronic prednisone as as high 80 mg/day (self medications). Today she is much better, ambulating, speaking in full sentences. No fever or chills, not on 02. Still c/o back pain. IgE (Immunoglobulin E), Serum 0 - 100 IU/mL 418 (H)   Comments: (NOTE) eosinphils in normal range       Sputum c/s pending     PMHX:   Past Medical History  Diagnosis Date  . Cardiomyopathy   . Hypertension   . Hyperlipidemia   . Asthma   . COPD (chronic obstructive pulmonary disease)   . PVC's (premature ventricular contractions)    Surgical Hx:  Past Surgical History  Procedure Laterality Date  . Cardiac catheterization    . Cesarean section    . Appendectomy    . Cataract extraction      right eye    Family Hx:  Family History  Problem Relation Age of Onset  . Heart attack Father 52    MI x 3    Social Hx:   History  Substance Use Topics  . Smoking status: Current Every Day Smoker -- 0.50 packs/day for 50 years    Types: Cigarettes  . Smokeless tobacco: Not on file  . Alcohol Use: No   Medication:    Home Medication:  No current outpatient prescriptions on file.  Current Medication: '@CURMEDTAB'$ @   Allergies:  Iodine  Review of Systems: Gen:  Denies  fever, sweats, chills HEENT: Denies blurred vision, double vision, ear pain, eye pain, hearing loss, nose bleeds, sore throat Cvc:  No dizziness, chest pain or heaviness Resp:  Less wheezing  Gi: Denies swallowing difficulty, stomach pain, nausea or vomiting, diarrhea, constipation, bowel incontinence Gu:  Denies bladder incontinence,  burning urine Ext:   No Joint pain, stiffness or swelling Skin: No skin rash, easy bruising or bleeding or hives Endoc:  No polyuria, polydipsia , polyphagia or weight change Psych: No depression, insomnia or hallucinations  Other:  All other systems negative  Physical Examination:   VS: BP 146/80 mmHg  Pulse 62  Temp(Src) 98.3 F (36.8 C) (Oral)  Resp 22  Ht '5\' 6"'$  (1.676 m)  Wt 186 lb (84.369 kg)  BMI 30.04 kg/m2  SpO2 94%  General Appearance: No distress  Neuropsych without focal findings, mental status, speech normal, alert and oriented, cranial nerves 2-12 intact, reflexes normal and symmetric, sensation grossly normal  HEENT: PERRLA, EOM intact, no ptosis, no other lesions noticed Neck: supple Pulmonary:.better aeration No rales    Cardiovascular:  Normal S1,S2.  No m/r/g.  Abdominal aorta pulsation normal.    Abdomen:Benign, Soft, non-tender, No masses, hepatosplenomegaly, No lymphadenopathy Endoc: No evident thyromegaly, no signs of acromegaly or Cushing features Skin:   warm, no rashes, no ecchymosis  Extremities: normal, no cyanosis, clubbing, no edema, warm with normal capillary refill. Other findings:   Labs results:   Recent Labs     06/30/14  0100  06/30/14  0532  HGB  15.0  13.8  HCT  44.4  42.5  MCV  94.3  95.6  WBC  16.2*  14.0*  BUN  17  16  CREATININE  0.73  0.81  0.80  GLUCOSE  106*  314*  CALCIUM  9.0  8.5*  ,    No results for input(s): PH in the last 72 hours.  Invalid input(s): PCO2, PO2, BASEEXCESS, BASEDEFICITE, TFT  Culture results:     Rad results:   Dg Chest 1 View  06/30/2014   CLINICAL DATA:  Acute onset of shortness of breath and difficulty breathing. Initial encounter.  EXAM: CHEST  1 VIEW  COMPARISON:  Chest radiograph performed 04/08/2013, and CT of the chest performed 11/04/2009  FINDINGS: The lungs are well-aerated. Minimal left basilar atelectasis is noted. There is no evidence of pleural effusion or pneumothorax.  The  cardiomediastinal silhouette is borderline normal in size. No acute osseous abnormalities are seen.  IMPRESSION: Minimal left basilar atelectasis noted; lungs otherwise clear.   Electronically Signed   By: Garald Balding M.D.   On: 06/30/2014 02:30   Assessment and Plan: 1Came in with increase sob, wheezing, tachy, leukocytosis and ? RML infiltrate. So far with your efforts she has made increase improvement. I doubt impending sepsis. Her elevated wbc is due to steroids. Unfortunately she still smokes. Doubt ABPA.  2 Mid t-spine pain  3 Keratotic like lesion on back, r/o malignancy  4 Nicotine abuse  Plan -advised strongly to stop smoking -continue present regimen -our task is to determine the appropriate regimen (singulair, symbicort, albuterol, prednisone at a minimum) -dvt prophylaxis -pain control -dermatology consult on d/c.   I have personally obtained a history, examined the patient, evaluated laboratory and imaging results, formulated the assessment and plan and placed orders.  The Patient requires high complexity decision making for assessment and support, frequent evaluation and titration of therapies, application of advanced monitoring technologies and extensive interpretation of multiple databases.   Herbon Fleming,M.D. Pulmonary & Critical care Medicine [facility]

## 2014-07-02 ENCOUNTER — Inpatient Hospital Stay: Payer: 59

## 2014-07-02 LAB — CREATININE, SERUM
CREATININE: 0.85 mg/dL (ref 0.44–1.00)
GFR calc Af Amer: >60 mL/min (ref >60)
GFR calc non Af Amer: >60 mL/min (ref >60)

## 2014-07-02 LAB — VANCOMYCIN, TROUGH: Vancomycin Tr: 12 ug/mL (ref 10–20)

## 2014-07-02 MED ORDER — METHOCARBAMOL 500 MG PO TABS: 500.0000 mg | ORAL_TABLET | Freq: Four times a day (QID) | ORAL | Status: DC | PRN

## 2014-07-02 MED ORDER — METOPROLOL TARTRATE 100 MG PO TABS: 100.0000 mg | ORAL_TABLET | Freq: Every day | ORAL | Status: DC

## 2014-07-02 MED ORDER — OXYCODONE-ACETAMINOPHEN 5-325 MG PO TABS: 2.0000 | ORAL_TABLET | Freq: Four times a day (QID) | ORAL | Status: DC | PRN

## 2014-07-02 MED ORDER — VANCOMYCIN HCL 10 G IV SOLR
1250.0000 mg | Freq: Once | INTRAVENOUS | Status: DC
  Filled 2014-07-02: qty 1250

## 2014-07-02 MED ORDER — AZITHROMYCIN 500 MG PO TABS: 500.0000 mg | ORAL_TABLET | Freq: Every day | ORAL | Status: DC

## 2014-07-02 MED ORDER — VANCOMYCIN HCL 10 G IV SOLR
1250.0000 mg | Freq: Two times a day (BID) | INTRAVENOUS | Status: DC
  Filled 2014-07-02 (×2): qty 1250

## 2014-07-02 MED ORDER — PNEUMOCOCCAL VAC POLYVALENT 25 MCG/0.5ML IJ INJ
0.5000 mL | INJECTION | INTRAMUSCULAR | Status: AC | PRN
  Administered 2014-07-02: 0.5 mL via INTRAMUSCULAR
  Filled 2014-07-02: qty 0.5

## 2014-07-02 NOTE — Progress Notes (Signed)
ANTIBIOTIC CONSULT NOTE - FOLLOW UP  Pharmacy Consult for Vancomycin and Zosyn  Indication: rule out sepsis  Allergies  Allergen Reactions  . Iodine Anaphylaxis    Patient Measurements: Height: '5\' 6"'$  (167.6 cm) Weight: 170 lb 14.4 oz (77.52 kg) IBW/kg (Calculated) : 59.3 Adjusted Body Weight: 69.3 kg  Vital Signs: Temp: 97.5 F (36.4 C) (05/18 1159) Temp Source: Oral (05/18 1159) BP: 147/83 mmHg (05/18 1159) Pulse Rate: 71 (05/18 1159) Intake/Output from previous day: 05/17 0701 - 05/18 0700 In: 600 [P.O.:600] Out: 1750 [Urine:1750] Intake/Output from this shift: Total I/O In: 360 [P.O.:360] Out: -   Labs:  Recent Labs  06/30/14 0100 06/30/14 0532 07/02/14 0008  WBC 16.2* 14.0*  --   HGB 15.0 13.8  --   PLT 233 208  --   CREATININE 0.73 0.81  0.80 0.85   Estimated Creatinine Clearance: 71.2 mL/min (by C-G formula based on Cr of 0.85).  Recent Labs  07/02/14 1121  [place] 12     Microbiology: Recent Results (from the past 720 hour(s))  Culture, sputum-assessment     Status: None   Collection Time: 06/30/14 10:38 AM  Result Value Ref Range Status   Specimen Description SPUTUM  Final   Special Requests NONE  Final   Sputum evaluation THIS SPECIMEN IS ACCEPTABLE FOR SPUTUM CULTURE  Final   Report Status 06/30/2014 FINAL  Final  Culture, respiratory (NON-Expectorated)     Status: None (Preliminary result)   Collection Time: 06/30/14 10:38 AM  Result Value Ref Range Status   Specimen Description SPUTUM  Final   Special Requests NONE Reflexed from [id number]  Final   Gram Stain   Final    FAIR SPECIMEN - 70-80% WBCS FEW WBC SEEN MANY GRAM NEGATIVE RODS MODERATE GRAM POSITIVE RODS MODERATE GRAM POSITIVE COCCI IN TETRADS IN CLUSTERS    Culture HOLDING FOR POSSIBLE PATHOGEN  Final   Report Status PENDING  Incomplete    Anti-infectives    Start     Dose/Rate Route Frequency Ordered Stop   07/03/14 0130  vancomycin (VANCOCIN) 1,250 mg in sodium  chloride 0.9 % 250 mL IVPB     1,250 mg 166.7 mL/hr over 90 Minutes Intravenous Every 12 hours 07/02/14 1303     07/02/14 1301  vancomycin (VANCOCIN) 1,250 mg in sodium chloride 0.9 % 250 mL IVPB     1,250 mg 166.7 mL/hr over 90 Minutes Intravenous  Once 07/02/14 1301     07/02/14 0000  azithromycin (ZITHROMAX) 500 MG tablet     500 mg Oral Daily 07/02/14 1108     06/30/14 1400  piperacillin-tazobactam (ZOSYN) IVPB 3.375 g     3.375 g 12.5 mL/hr over 240 Minutes Intravenous 3 times per day 06/30/14 0633     06/30/14 1200  vancomycin (VANCOCIN) IVPB 1000 mg/200 mL premix  Status:  Discontinued     1,000 mg 200 mL/hr over 60 Minutes Intravenous Every 12 hours 06/30/14 0631 07/02/14 1301   06/30/14 0700  vancomycin (VANCOCIN) IVPB 1000 mg/200 mL premix     1,000 mg 200 mL/hr over 60 Minutes Intravenous STAT 06/30/14 0658 07/01/14 0700   06/30/14 0645  piperacillin-tazobactam (ZOSYN) IVPB 3.375 g     3.375 g 100 mL/hr over 30 Minutes Intravenous STAT 06/30/14 0633 07/01/14 0645   06/30/14 0600  piperacillin-tazobactam (ZOSYN) IVPB 3.375 g  Status:  Discontinued     3.375 g 100 mL/hr over 30 Minutes Intravenous 3 times per day 06/30/14 0520 06/30/14 6389   06/30/14  0530  vancomycin (VANCOCIN) IVPB 1000 mg/200 mL premix  Status:  Discontinued     1,000 mg 200 mL/hr over 60 Minutes Intravenous Every 24 hours 06/30/14 0520 06/30/14 0631   06/30/14 0415  azithromycin (ZITHROMAX) 500 mg in dextrose 5 % 250 mL IVPB  Status:  Discontinued     500 mg 250 mL/hr over 60 Minutes Intravenous  Once 06/30/14 0412 06/30/14 0600      Assessment: Empiric therapy for sepsis. Vancomycin level resulted @ 12 mcg/ml.  Goal of Therapy:  Vancomycin trough level 15-20 mcg/ml  Plan:  Follow up culture results  Will change to Vancomycin 1250 mg IV q12 hours and will order Vancomycin level @ 13:00 on 5/20.   Brace Welte D 07/02/2014,1:16 PM

## 2014-07-03 LAB — CULTURE, RESPIRATORY

## 2014-07-03 LAB — CULTURE, RESPIRATORY W GRAM STAIN: Culture: NORMAL

## 2014-07-04 NOTE — Discharge Summary (Signed)
Grace [name], is a 64 y.o. female  DOB September 27, 1950  MRN [id number].  Admission date:  06/30/2014  Admitting Physician  Lance Coon, MD  Discharge Date:  07/04/2014   Primary MD  [name], MD  Recommendations for primary care physician for things to follow:   Follow-up with Dr. Raul Del   Admission Diagnosis  Bronchitis [J40] COPD with acute exacerbation [J44.1]   Discharge Diagnosis  Bronchitis [J40] COPD with acute exacerbation [J44.1]    Principal Problem:   Sepsis Active Problems:   COPD (chronic obstructive pulmonary disease) with chronic bronchitis   CAP (community acquired pneumonia)   GERD (gastroesophageal reflux disease)   Depression      Past Medical History  Diagnosis Date  . Cardiomyopathy   . Hypertension   . Hyperlipidemia   . Asthma   . COPD (chronic obstructive pulmonary disease)   . PVC's (premature ventricular contractions)     Past Surgical History  Procedure Laterality Date  . Cardiac catheterization    . Cesarean section    . Appendectomy    . Cataract extraction      right eye        History of present illness and  Hospital Course:     Kindly see H&P for history of present illness and admission details, please review complete Labs, Consult reports and Test reports for all details in brief  HPI  from the history and physical done on the day of admission 64 year old female patient history of COPD admitted because of COPD exacerbation. Patient received IV Solu-Medrol, nebulizers, antibiotics.monitroed on tele.   Hospital Course    64 year old female with heavy tobacco abuse, COPD on chronic steroids admitted for sepsis secondary to community-acquired pneumonia. Patient is has COPD and follows up with Dr. Raul Del. Start on vancomycin and Zosyn, steroids. Patient O2 sats  100% on room. Patient symptoms nicely improved  And able to speak  full sentences.dicharged home with abx.steroids.advised to follow up with DR.Fleming, 2.acute on chronic back pain;concern for compression fracture of thoracic vertebra due to her chronic steroid use.but xray of thoracic spine  Showed T 5-T 7 p[artial compression since Feb  2015,pt felt better with  Robaxin,percocet.  Discharge Condition:stable   Follow UP  Follow-up Information    Please follow up.   Why:  Patient will make follow up appointment with Dr. Chrystine Oiler, PCP  [phone]  and with Dr. Vella Kohler [phone].        Discharge Instructions  and  Discharge Medications follow up with DR.Fleming      Medication List    STOP taking these medications        metoprolol succinate 100 MG 24 hr tablet  Commonly known as:  TOPROL-XL     predniSONE 20 MG tablet  Commonly known as:  DELTASONE      TAKE these medications        albuterol 108 (90 BASE) MCG/ACT inhaler  Commonly known as:  PROVENTIL HFA;VENTOLIN HFA  Inhale 2 puffs into the lungs every 4 (four) hours as needed.     amoxicillin-clavulanate 875-125 MG per tablet  Commonly known as:  AUGMENTIN  Take 1 tablet by mouth 2 (two) times daily.     ARIPiprazole 5 MG tablet  Commonly known as:  ABILIFY  Take 5 mg by mouth daily.     azithromycin 500 MG tablet  Commonly known as:  ZITHROMAX  Take 1 tablet (500 mg total) by mouth daily.     DULoxetine 60 MG  capsule  Commonly known as:  CYMBALTA  Take 60 mg by mouth daily.     Fish Oil 1200 MG Caps  Take by mouth daily.     gabapentin 300 MG capsule  Commonly known as:  NEURONTIN  Take 300 mg by mouth 4 (four) times daily as needed.     ibuprofen 100 MG/5ML suspension  Commonly known as:  ADVIL,MOTRIN  Take 800 mg by mouth every 6 (six) hours as needed.     LORazepam 0.5 MG tablet  Commonly known as:  ATIVAN  Take 0.5 mg by mouth every 8 (eight) hours as needed for anxiety.      methocarbamol 500 MG tablet  Commonly known as:  ROBAXIN  Take 1 tablet (500 mg total) by mouth every 6 (six) hours as needed for muscle spasms.     metoprolol 100 MG tablet  Commonly known as:  LOPRESSOR  Take 1 tablet (100 mg total) by mouth daily.     modafinil 200 MG tablet  Commonly known as:  PROVIGIL  Take 200 mg by mouth daily.     ondansetron 4 MG disintegrating tablet  Commonly known as:  ZOFRAN-ODT  Take 4 mg by mouth every 8 (eight) hours as needed for nausea or vomiting.     oxyCODONE-acetaminophen 5-325 MG per tablet  Commonly known as:  PERCOCET/ROXICET  Take 2 tablets by mouth every 4 (four) hours as needed for severe pain.     oxyCODONE-acetaminophen 5-325 MG per tablet  Commonly known as:  PERCOCET/ROXICET  Take 2 tablets by mouth every 6 (six) hours as needed for moderate pain.     promethazine 25 MG tablet  Commonly known as:  PHENERGAN  Take 50 mg by mouth every 4 (four) hours as needed for nausea or vomiting.     varenicline 1 MG tablet  Commonly known as:  CHANTIX  Take 1 mg by mouth 2 (two) times daily.          Diet and Activity recommendation: See Discharge Instructions above   Consults obtained -pulmonary   Major procedures and Radiology Reports - PLEASE review detailed and final reports for all details, in brief -      Dg Chest 1 View  06/30/2014   CLINICAL DATA:  Acute onset of shortness of breath and difficulty breathing. Initial encounter.  EXAM: CHEST  1 VIEW  COMPARISON:  Chest radiograph performed 04/08/2013, and CT of the chest performed 11/04/2009  FINDINGS: The lungs are well-aerated. Minimal left basilar atelectasis is noted. There is no evidence of pleural effusion or pneumothorax.  The cardiomediastinal silhouette is borderline normal in size. No acute osseous abnormalities are seen.  IMPRESSION: Minimal left basilar atelectasis noted; lungs otherwise clear.   Electronically Signed   By: Garald Balding M.D.   On: 06/30/2014  02:30   Dg Thoracic Spine 2 View  07/02/2014   CLINICAL DATA:  Two weeks of mid back pain following a coughing episode ; remote history of thoracic spine injury.  EXAM: THORACIC SPINE - 2 VIEW  COMPARISON:  PA and lateral chest x-ray of April 05, 2013 and portable chest x-ray of Jun 29, 2014  FINDINGS: There is partial compression of the body of T7 which has appeared since the February 2015 study. The loss of height anteriorly is 60%. The loss of height posteriorly is approximately 15%. In addition there is high-grade compression of the body of T5 which is also new. The loss of height here is approximately 70% anteriorly and  60% posteriorly. The pedicles are intact. There are no abnormal paravertebral soft tissue densities. 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  IMPRESSION: New partial compressions of the body of T5 and T7 since February of 2016. 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   Electronically Signed   By: David  Martinique M.D.   On: 07/02/2014 12:38    Micro Results    Recent Results (from the past 240 hour(s))  Culture, sputum-assessment     Status: None   Collection Time: 06/30/14 10:38 AM  Result Value Ref Range Status   Specimen Description SPUTUM  Final   Special Requests NONE  Final   Sputum evaluation THIS SPECIMEN IS ACCEPTABLE FOR SPUTUM CULTURE  Final   Report Status 06/30/2014 FINAL  Final  Culture, respiratory (NON-Expectorated)     Status: None   Collection Time: 06/30/14 10:38 AM  Result Value Ref Range Status   Specimen Description SPUTUM  Final   Special Requests NONE Reflexed from [id number]  Final   Gram Stain   Final    FAIR SPECIMEN - 70-80% WBCS FEW WBC SEEN MANY GRAM NEGATIVE RODS MODERATE GRAM POSITIVE RODS MODERATE GRAM POSITIVE COCCI IN TETRADS IN CLUSTERS    Culture   Final    APPEARS TO BE NORMAL FLORA GRAM NEGATIVE RODS SEEN ON GRAM STAIN NOT RECOVERED IN CULTURE    Report Status 07/03/2014  FINAL  Final       Today   Subjective:   Adelma Bowdoin today has no headache,no chest abdominal pain,no new weakness tingling or numbness, feels much better wants to go home today. *\  Objective:   Blood pressure 147/83, pulse 71, temperature 97.5 F (36.4 C), temperature source Oral, resp. rate 18, height '5\' 6"'$  (1.676 m), weight 77.52 kg (170 lb 14.4 oz), SpO2 95 %.  No intake or output data in the 24 hours ending 07/04/14 1712  Exam Awake Alert, Oriented x 3, No new F.N deficits, Normal affect [place].AT,PERRAL Supple Neck,No JVD, No cervical lymphadenopathy appriciated.  Symmetrical Chest wall movement, Good air movement bilaterally, CTAB RRR,No Gallops,Rubs or new Murmurs, No Parasternal Heave +ve B.Sounds, Abd Soft, Non tender, No organomegaly appriciated, No rebound -guarding or rigidity. No Cyanosis, Clubbing or edema, No new Rash or bruise  Data Review   CBC w Diff:  Lab Results  Component Value Date   WBC 14.0* 06/30/2014   WBC 10.8 02/21/2014   HGB 13.8 06/30/2014   HGB 15.3 02/21/2014   HCT 42.5 06/30/2014   HCT 45.5 02/21/2014   PLT 208 06/30/2014   PLT 247 02/21/2014   LYMPHOPCT 7 06/30/2014   LYMPHOPCT 24.5 02/21/2014   MONOPCT 4 06/30/2014   MONOPCT 6.5 02/21/2014   EOSPCT 0 06/30/2014   EOSPCT 0.6 02/21/2014   BASOPCT 0 06/30/2014   BASOPCT 0.6 02/21/2014    CMP:  Lab Results  Component Value Date   NA 138 06/30/2014   NA 138 02/21/2014   K 3.9 06/30/2014   K 3.7 02/21/2014   CL 100* 06/30/2014   CL 106 02/21/2014   CO2 29 06/30/2014   CO2 21 02/21/2014   BUN 16 06/30/2014   BUN 11 02/21/2014   CREATININE 0.85 07/02/2014   CREATININE 0.89 02/21/2014   PROT 6.5 06/30/2014   PROT 7.8 02/21/2014   ALBUMIN 3.8 06/30/2014   ALBUMIN 4.0 02/21/2014   BILITOT 0.5 06/30/2014   ALKPHOS 59 06/30/2014   ALKPHOS 110 02/21/2014   AST 25 06/30/2014  AST 29 02/21/2014   ALT 31 06/30/2014   ALT 38 02/21/2014  .   Total Time in preparing paper  work, data evaluation and todays exam - 38 minutes  Ramsey Midgett M.D on 07/04/2014 at 5:12 PM

## 2014-07-11 ENCOUNTER — Other Ambulatory Visit: Payer: Self-pay | Admitting: Family Medicine

## 2014-07-11 DIAGNOSIS — IMO0002 Reserved for concepts with insufficient information to code with codable children: Secondary | ICD-10-CM

## 2014-07-15 ENCOUNTER — Emergency Department: Payer: 59

## 2014-07-15 ENCOUNTER — Inpatient Hospital Stay (HOSPITAL_COMMUNITY): Payer: 59

## 2014-07-15 ENCOUNTER — Emergency Department
Admission: EM | Admit: 2014-07-15 | Discharge: 2014-07-15 | Disposition: A | Discharge status: Nursing Home (Includes ICF) | Payer: 59 | Attending: Emergency Medicine | Admitting: Emergency Medicine

## 2014-07-15 ENCOUNTER — Encounter: Payer: Self-pay | Admitting: Emergency Medicine

## 2014-07-15 ENCOUNTER — Inpatient Hospital Stay (HOSPITAL_COMMUNITY)
Admission: AD | Admit: 2014-07-15 | Discharge: 2014-07-17 | DRG: 543 | Disposition: A | Discharge status: Home / Self Care | Payer: 59 | Source: Other Acute Inpatient Hospital | Attending: Neurosurgery | Admitting: Neurosurgery

## 2014-07-15 ENCOUNTER — Other Ambulatory Visit: Payer: Self-pay | Admitting: Neurological Surgery

## 2014-07-15 DIAGNOSIS — Z79899 Other long term (current) drug therapy: Secondary | ICD-10-CM | POA: Diagnosis not present

## 2014-07-15 DIAGNOSIS — Z888 Allergy status to other drugs, medicaments and biological substances status: Secondary | ICD-10-CM | POA: Diagnosis not present

## 2014-07-15 DIAGNOSIS — Y9389 Activity, other specified: Secondary | ICD-10-CM | POA: Insufficient documentation

## 2014-07-15 DIAGNOSIS — G9520 Unspecified cord compression: Secondary | ICD-10-CM

## 2014-07-15 DIAGNOSIS — R531 Weakness: Secondary | ICD-10-CM | POA: Insufficient documentation

## 2014-07-15 DIAGNOSIS — S24132A Anterior cord syndrome at T2-T6 level of thoracic spinal cord, initial encounter: Secondary | ICD-10-CM | POA: Diagnosis not present

## 2014-07-15 DIAGNOSIS — Z9841 Cataract extraction status, right eye: Secondary | ICD-10-CM | POA: Diagnosis not present

## 2014-07-15 DIAGNOSIS — R269 Unspecified abnormalities of gait and mobility: Secondary | ICD-10-CM | POA: Diagnosis present

## 2014-07-15 DIAGNOSIS — S3992XA Unspecified injury of lower back, initial encounter: Secondary | ICD-10-CM | POA: Diagnosis present

## 2014-07-15 DIAGNOSIS — S22049A Unspecified fracture of fourth thoracic vertebra, initial encounter for closed fracture: Secondary | ICD-10-CM | POA: Diagnosis not present

## 2014-07-15 DIAGNOSIS — F17210 Nicotine dependence, cigarettes, uncomplicated: Secondary | ICD-10-CM | POA: Diagnosis present

## 2014-07-15 DIAGNOSIS — W1839XA Other fall on same level, initial encounter: Secondary | ICD-10-CM | POA: Insufficient documentation

## 2014-07-15 DIAGNOSIS — R29898 Other symptoms and signs involving the musculoskeletal system: Secondary | ICD-10-CM

## 2014-07-15 DIAGNOSIS — M4854XA Collapsed vertebra, not elsewhere classified, thoracic region, initial encounter for fracture: Secondary | ICD-10-CM | POA: Diagnosis present

## 2014-07-15 DIAGNOSIS — Z791 Long term (current) use of non-steroidal anti-inflammatories (NSAID): Secondary | ICD-10-CM | POA: Diagnosis not present

## 2014-07-15 DIAGNOSIS — I420 Dilated cardiomyopathy: Secondary | ICD-10-CM | POA: Diagnosis not present

## 2014-07-15 DIAGNOSIS — Z9181 History of falling: Secondary | ICD-10-CM | POA: Diagnosis not present

## 2014-07-15 DIAGNOSIS — J4489 Other specified chronic obstructive pulmonary disease: Secondary | ICD-10-CM | POA: Diagnosis present

## 2014-07-15 DIAGNOSIS — I10 Essential (primary) hypertension: Secondary | ICD-10-CM | POA: Diagnosis present

## 2014-07-15 DIAGNOSIS — J449 Chronic obstructive pulmonary disease, unspecified: Secondary | ICD-10-CM | POA: Diagnosis not present

## 2014-07-15 DIAGNOSIS — W19XXXA Unspecified fall, initial encounter: Secondary | ICD-10-CM

## 2014-07-15 DIAGNOSIS — G959 Disease of spinal cord, unspecified: Secondary | ICD-10-CM | POA: Diagnosis present

## 2014-07-15 DIAGNOSIS — Z792 Long term (current) use of antibiotics: Secondary | ICD-10-CM | POA: Diagnosis not present

## 2014-07-15 DIAGNOSIS — S22000A Wedge compression fracture of unspecified thoracic vertebra, initial encounter for closed fracture: Secondary | ICD-10-CM

## 2014-07-15 DIAGNOSIS — E785 Hyperlipidemia, unspecified: Secondary | ICD-10-CM | POA: Diagnosis present

## 2014-07-15 DIAGNOSIS — Z720 Tobacco use: Secondary | ICD-10-CM | POA: Insufficient documentation

## 2014-07-15 DIAGNOSIS — Z79891 Long term (current) use of opiate analgesic: Secondary | ICD-10-CM | POA: Diagnosis not present

## 2014-07-15 DIAGNOSIS — S22000S Wedge compression fracture of unspecified thoracic vertebra, sequela: Secondary | ICD-10-CM | POA: Diagnosis not present

## 2014-07-15 DIAGNOSIS — S22059A Unspecified fracture of T5-T6 vertebra, initial encounter for closed fracture: Secondary | ICD-10-CM | POA: Diagnosis not present

## 2014-07-15 DIAGNOSIS — J45909 Unspecified asthma, uncomplicated: Secondary | ICD-10-CM | POA: Diagnosis present

## 2014-07-15 DIAGNOSIS — Y998 Other external cause status: Secondary | ICD-10-CM | POA: Insufficient documentation

## 2014-07-15 DIAGNOSIS — I509 Heart failure, unspecified: Secondary | ICD-10-CM | POA: Diagnosis not present

## 2014-07-15 DIAGNOSIS — Y9289 Other specified places as the place of occurrence of the external cause: Secondary | ICD-10-CM | POA: Insufficient documentation

## 2014-07-15 LAB — BASIC METABOLIC PANEL
ANION GAP: 11 (ref 5–15)
BUN: 16 mg/dL (ref 6–20)
CHLORIDE: 102 mmol/L (ref 101–111)
CO2: 30 mmol/L (ref 22–32)
CREATININE: 0.79 mg/dL (ref 0.44–1.00)
Calcium: 9 mg/dL (ref 8.9–10.3)
GFR calc Af Amer: >60 mL/min (ref >60)
GFR calc non Af Amer: >60 mL/min (ref >60)
Glucose, Bld: 161 mg/dL — ABNORMAL HIGH (ref 65–99)
POTASSIUM: 4 mmol/L (ref 3.5–5.1)
SODIUM: 143 mmol/L (ref 135–145)

## 2014-07-15 LAB — CBC
HEMATOCRIT: 43.3 % (ref 35.0–47.0)
HEMOGLOBIN: 14.9 g/dL (ref 12.0–16.0)
MCH: 32.3 pg (ref 26.0–34.0)
MCHC: 34.3 g/dL (ref 32.0–36.0)
MCV: 93.9 fL (ref 80.0–100.0)
Platelets: 165 10*3/uL (ref 150–440)
RBC: 4.61 MIL/uL (ref 3.80–5.20)
RDW: 14.6 % — ABNORMAL HIGH (ref 11.5–14.5)
WBC: 10.2 10*3/uL (ref 3.6–11.0)

## 2014-07-15 MED ORDER — METOPROLOL TARTRATE 100 MG PO TABS
100.0000 mg | ORAL_TABLET | Freq: Every day | ORAL | Status: DC
  Administered 2014-07-16 – 2014-07-17 (×2): 100 mg via ORAL
  Filled 2014-07-15: qty 1
  Filled 2014-07-15 (×2): qty 2

## 2014-07-15 MED ORDER — GADOBENATE DIMEGLUMINE 529 MG/ML IV SOLN
16.0000 mL | Freq: Once | INTRAVENOUS | Status: AC | PRN
  Administered 2014-07-15: 16 mL via INTRAVENOUS

## 2014-07-15 MED ORDER — ONDANSETRON HCL 4 MG/2ML IJ SOLN
INTRAMUSCULAR | Status: AC
  Administered 2014-07-15: 4 mg via INTRAVENOUS
  Filled 2014-07-15: qty 2

## 2014-07-15 MED ORDER — BISACODYL 10 MG RE SUPP: 10.0000 mg | Freq: Every day | RECTAL | Status: DC | PRN

## 2014-07-15 MED ORDER — MORPHINE SULFATE 4 MG/ML IJ SOLN
INTRAMUSCULAR | Status: AC
  Administered 2014-07-15: 4 mg via INTRAVENOUS
  Filled 2014-07-15: qty 1

## 2014-07-15 MED ORDER — METHOCARBAMOL 500 MG PO TABS: 500.0000 mg | ORAL_TABLET | Freq: Four times a day (QID) | ORAL | Status: DC | PRN

## 2014-07-15 MED ORDER — MORPHINE SULFATE 4 MG/ML IJ SOLN
4.0000 mg | Freq: Once | INTRAMUSCULAR | Status: AC
  Administered 2014-07-15: 4 mg via INTRAVENOUS

## 2014-07-15 MED ORDER — FISH OIL 1200 MG PO CAPS: ORAL_CAPSULE | Freq: Every day | ORAL | Status: DC

## 2014-07-15 MED ORDER — GABAPENTIN 300 MG PO CAPS
300.0000 mg | ORAL_CAPSULE | Freq: Three times a day (TID) | ORAL | Status: DC
  Administered 2014-07-15: 300 mg via ORAL
  Filled 2014-07-15: qty 1

## 2014-07-15 MED ORDER — GABAPENTIN 300 MG PO CAPS
300.0000 mg | ORAL_CAPSULE | Freq: Three times a day (TID) | ORAL | Status: DC
  Administered 2014-07-15 – 2014-07-17 (×6): 300 mg via ORAL
  Filled 2014-07-15 (×6): qty 1

## 2014-07-15 MED ORDER — AMOXICILLIN-POT CLAVULANATE 875-125 MG PO TABS
1.0000 | ORAL_TABLET | Freq: Two times a day (BID) | ORAL | Status: DC
Start: 2014-07-15 — End: 2014-07-15

## 2014-07-15 MED ORDER — FLEET ENEMA 7-19 GM/118ML RE ENEM: 1.0000 | ENEMA | Freq: Once | RECTAL | Status: AC | PRN

## 2014-07-15 MED ORDER — ONDANSETRON HCL 4 MG/2ML IJ SOLN
INTRAMUSCULAR | Status: AC
Start: 2014-07-15 — End: 2014-07-15
  Administered 2014-07-15: 4 mg via INTRAVENOUS
  Filled 2014-07-15: qty 2

## 2014-07-15 MED ORDER — AZITHROMYCIN 500 MG PO TABS
500.0000 mg | ORAL_TABLET | Freq: Every day | ORAL | Status: DC
  Filled 2014-07-15: qty 1

## 2014-07-15 MED ORDER — METOPROLOL TARTRATE 100 MG PO TABS
ORAL_TABLET | ORAL | Status: AC
  Administered 2014-07-15: 200 mg via ORAL
  Filled 2014-07-15: qty 2

## 2014-07-15 MED ORDER — DULOXETINE HCL 60 MG PO CPEP: 60.0000 mg | ORAL_CAPSULE | Freq: Every day | ORAL | Status: DC

## 2014-07-15 MED ORDER — HYDROMORPHONE HCL 1 MG/ML IJ SOLN
1.0000 mg | INTRAMUSCULAR | Status: DC | PRN
  Administered 2014-07-15 – 2014-07-17 (×9): 1 mg via INTRAVENOUS
  Filled 2014-07-15 (×9): qty 1

## 2014-07-15 MED ORDER — ALBUTEROL SULFATE (2.5 MG/3ML) 0.083% IN NEBU: 2.5000 mg | INHALATION_SOLUTION | RESPIRATORY_TRACT | Status: DC | PRN

## 2014-07-15 MED ORDER — OXYCODONE HCL 5 MG PO TABS: 5.0000 mg | ORAL_TABLET | ORAL | Status: DC | PRN

## 2014-07-15 MED ORDER — AZITHROMYCIN 500 MG PO TABS
500.0000 mg | ORAL_TABLET | Freq: Every day | ORAL | Status: DC
  Administered 2014-07-15 – 2014-07-17 (×3): 500 mg via ORAL
  Filled 2014-07-15 (×3): qty 1

## 2014-07-15 MED ORDER — ONDANSETRON HCL 4 MG/2ML IJ SOLN
4.0000 mg | Freq: Once | INTRAMUSCULAR | Status: AC
  Administered 2014-07-15: 4 mg via INTRAVENOUS

## 2014-07-15 MED ORDER — VARENICLINE TARTRATE 1 MG PO TABS
1.0000 mg | ORAL_TABLET | Freq: Two times a day (BID) | ORAL | Status: DC
  Administered 2014-07-15 – 2014-07-17 (×4): 1 mg via ORAL
  Filled 2014-07-15 (×6): qty 1

## 2014-07-15 MED ORDER — OXYCODONE-ACETAMINOPHEN 5-325 MG PO TABS
1.0000 | ORAL_TABLET | ORAL | Status: DC | PRN
  Administered 2014-07-15 – 2014-07-16 (×5): 2 via ORAL
  Administered 2014-07-17: 1 via ORAL
  Administered 2014-07-17 (×2): 2 via ORAL
  Filled 2014-07-15 (×9): qty 2

## 2014-07-15 MED ORDER — OMEGA-3-ACID ETHYL ESTERS 1 G PO CAPS
1.0000 g | ORAL_CAPSULE | Freq: Every day | ORAL | Status: DC
  Administered 2014-07-16 – 2014-07-17 (×2): 1 g via ORAL
  Filled 2014-07-15 (×2): qty 1

## 2014-07-15 MED ORDER — LORAZEPAM 0.5 MG PO TABS: 0.5000 mg | ORAL_TABLET | Freq: Three times a day (TID) | ORAL | Status: DC | PRN

## 2014-07-15 MED ORDER — ARIPIPRAZOLE 10 MG PO TABS: 5.0000 mg | ORAL_TABLET | Freq: Every day | ORAL | Status: DC

## 2014-07-15 MED ORDER — ONDANSETRON 4 MG PO TBDP: 4.0000 mg | ORAL_TABLET | Freq: Three times a day (TID) | ORAL | Status: DC | PRN

## 2014-07-15 MED ORDER — GUAIFENESIN ER 600 MG PO TB12
1200.0000 mg | ORAL_TABLET | Freq: Two times a day (BID) | ORAL | Status: DC
  Administered 2014-07-15 – 2014-07-17 (×4): 1200 mg via ORAL
  Filled 2014-07-15 (×4): qty 2

## 2014-07-15 MED ORDER — DIAZEPAM 5 MG/ML IJ SOLN
2.5000 mg | Freq: Once | INTRAMUSCULAR | Status: AC
  Administered 2014-07-15: 2.5 mg via INTRAVENOUS

## 2014-07-15 MED ORDER — ARIPIPRAZOLE 10 MG PO TABS
5.0000 mg | ORAL_TABLET | Freq: Every day | ORAL | Status: DC
  Administered 2014-07-15 – 2014-07-17 (×3): 5 mg via ORAL
  Filled 2014-07-15 (×3): qty 1

## 2014-07-15 MED ORDER — POLYETHYLENE GLYCOL 3350 17 G PO PACK: 17.0000 g | PACK | Freq: Every day | ORAL | Status: DC | PRN

## 2014-07-15 MED ORDER — AMOXICILLIN-POT CLAVULANATE 875-125 MG PO TABS
1.0000 | ORAL_TABLET | Freq: Two times a day (BID) | ORAL | Status: DC
  Administered 2014-07-15 – 2014-07-17 (×4): 1 via ORAL
  Filled 2014-07-15 (×4): qty 1

## 2014-07-15 MED ORDER — DIAZEPAM 5 MG/ML IJ SOLN
INTRAMUSCULAR | Status: AC
  Administered 2014-07-15: 2.5 mg via INTRAVENOUS
  Filled 2014-07-15: qty 2

## 2014-07-15 MED ORDER — MODAFINIL 100 MG PO TABS
200.0000 mg | ORAL_TABLET | Freq: Every day | ORAL | Status: DC
  Administered 2014-07-16 – 2014-07-17 (×2): 200 mg via ORAL
  Filled 2014-07-15 (×2): qty 2

## 2014-07-15 MED ORDER — DULOXETINE HCL 60 MG PO CPEP
60.0000 mg | ORAL_CAPSULE | Freq: Every day | ORAL | Status: DC
  Administered 2014-07-15 – 2014-07-17 (×3): 60 mg via ORAL
  Filled 2014-07-15 (×3): qty 1

## 2014-07-15 MED ORDER — GUAIFENESIN-DM 100-10 MG/5ML PO SYRP: 5.0000 mL | ORAL_SOLUTION | ORAL | Status: DC | PRN

## 2014-07-15 MED ORDER — METOPROLOL TARTRATE 100 MG PO TABS
200.0000 mg | ORAL_TABLET | Freq: Once | ORAL | Status: AC
  Administered 2014-07-15: 200 mg via ORAL

## 2014-07-15 NOTE — ED Provider Notes (Signed)
MRI returns showing cord compression from T4-T6 possibly related to trauma and edema but could not be sure per radiologist could also be infection or tumor. Discussed with Dr. Ethelene Hal neurosurgery at [facility] who will put her on his service and further evaluate her patient will be transported by ambulance to [facility] ASAP  Nena Polio, MD 07/15/14 1145

## 2014-07-15 NOTE — ED Provider Notes (Signed)
[facility] Emergency Department Provider Note  ____________________________________________  Time seen: Approximately 530 AM  I have reviewed the triage vital signs and the nursing notes.   HISTORY  Chief Complaint Fall    HPI Grace [name] is a 63 y.o. female who comes in today with back pain and falls. The patient reports that she was here 2 weeks ago and diagnosed with pneumonia. The patient reports that while she was in the hospital developed some back pain and was diagnosed with a compression fracture in her T4-5 area. The patient reports that she did not find out the results while in the hospital but found out while she was at her primary care physician's office. She reports that yesterday she got up to go to the bathroom and fell flat on her face. She reports that one previous time she fell on her side. She reports that she's never had this problem prior to the compression fracture discovery. The patient reports that she came to see if she did any more damage to her previous compression fractures. The patient reports that the pain is in her thoracic spine in the middle area. She reports that she is unsure what she is able to do. She does have some numbness and tingling in her knees. The patient reports that the pain is shooting down her calves. She reports that the pain is worse when she walks. She denies incontinence or urinary retention denies bowel or bladder dysfunction.She reports that her pain is 7 out of 10 in intensity.   Past Medical History  Diagnosis Date  . Cardiomyopathy   . Hypertension   . Hyperlipidemia   . Asthma   . COPD (chronic obstructive pulmonary disease)   . PVC's (premature ventricular contractions)     Patient Active Problem List   Diagnosis Date Noted  . Sepsis 06/30/2014  . CAP (community acquired pneumonia) 06/30/2014  . GERD (gastroesophageal reflux disease) 06/30/2014  . Depression 06/30/2014  . PVC's (premature  ventricular contractions) 05/08/2014  . SOB (shortness of breath) 05/08/2014  . COPD (chronic obstructive pulmonary disease) with chronic bronchitis 05/08/2014  . Obesity 05/08/2014  . Tachycardia 05/08/2014  . 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 Cardiac catheterization    . Cesarean section    . Appendectomy    . Cataract extraction      right eye     Current Outpatient Rx  Name  Route  Sig  Dispense  Refill  . albuterol (PROVENTIL HFA;VENTOLIN HFA) 108 (90 BASE) MCG/ACT inhaler   Inhalation   Inhale 2 puffs into the lungs every 4 (four) hours as needed.          Marland Kitchen amoxicillin-clavulanate (AUGMENTIN) 875-125 MG per tablet   Oral   Take 1 tablet by mouth 2 (two) times daily.         . ARIPiprazole (ABILIFY) 5 MG tablet   Oral   Take 5 mg by mouth daily.         Marland Kitchen azithromycin (ZITHROMAX) 500 MG tablet   Oral   Take 1 tablet (500 mg total) by mouth daily.   5 tablet   0   . DULoxetine (CYMBALTA) 60 MG capsule   Oral   Take 60 mg by mouth daily.         Marland Kitchen gabapentin (NEURONTIN) 300 MG capsule   Oral   Take 300 mg by mouth 4 (four) times daily as needed.         Marland Kitchen  ibuprofen (ADVIL,MOTRIN) 100 MG/5ML suspension   Oral   Take 800 mg by mouth every 6 (six) hours as needed.         Marland Kitchen LORazepam (ATIVAN) 0.5 MG tablet   Oral   Take 0.5 mg by mouth every 8 (eight) hours as needed for anxiety.         . methocarbamol (ROBAXIN) 500 MG tablet   Oral   Take 1 tablet (500 mg total) by mouth every 6 (six) hours as needed for muscle spasms.   30 tablet   0   . metoprolol (LOPRESSOR) 100 MG tablet   Oral   Take 1 tablet (100 mg total) by mouth daily.   30 tablet   0   . modafinil (PROVIGIL) 200 MG tablet   Oral   Take 200 mg by mouth daily.         . Omega-3 Fatty Acids (FISH OIL) 1200 MG CAPS   Oral   Take by mouth daily.         . ondansetron (ZOFRAN-ODT) 4 MG disintegrating  tablet   Oral   Take 4 mg by mouth every 8 (eight) hours as needed for nausea or vomiting.         Marland Kitchen oxyCODONE-acetaminophen (PERCOCET/ROXICET) 5-325 MG per tablet   Oral   Take 2 tablets by mouth every 4 (four) hours as needed for severe pain.         Marland Kitchen oxyCODONE-acetaminophen (PERCOCET/ROXICET) 5-325 MG per tablet   Oral   Take 2 tablets by mouth every 6 (six) hours as needed for moderate pain.   30 tablet   0   . promethazine (PHENERGAN) 25 MG tablet   Oral   Take 50 mg by mouth every 4 (four) hours as needed for nausea or vomiting.         . varenicline (CHANTIX) 1 MG tablet   Oral   Take 1 mg by mouth 2 (two) times daily.           Allergies Iodine  Family History  Problem Relation Age of Onset  . Heart attack Father 95    MI x 3     Social History History  Substance Use Topics  . Smoking status: Current Every Day Smoker -- 0.50 packs/day for 50 years    Types: Cigarettes  . Smokeless tobacco: Not on file  . Alcohol Use: No    Review of Systems Constitutional: No fever/chills Eyes: No visual changes. ENT: No sore throat. Cardiovascular: Denies chest pain. Respiratory: Cough and shortness of breath. Gastrointestinal: No abdominal pain.  No nausea, no vomiting.   Genitourinary: Negative for dysuria. Musculoskeletal:  back pain. Skin: Negative for rash. Neurological: Negative for headaches,   10-point ROS otherwise negative.  ____________________________________________   PHYSICAL EXAM:  VITAL SIGNS: ED Triage Vitals  Enc Vitals Group     BP 07/15/14 0411 182/110 mmHg     Pulse Rate 07/15/14 0411 102     Resp 07/15/14 0651 20     Temp 07/15/14 0411 97.5 F (36.4 C)     Temp src --      SpO2 07/15/14 0411 96 %     Weight 07/15/14 0411 170 lb (77.111 kg)     Height 07/15/14 0411 '5\' 5"'$  (1.651 m)     Head Cir --      Peak Flow --      Pain Score 07/15/14 0412 7     Pain Loc --  Pain Edu? --      Excl. in [place]? --      Constitutional: Alert and oriented. Well appearing and in moderate distress. Eyes: Conjunctivae are normal. PERRL. EOMI. Head: Atraumatic. Nose: No congestion/rhinnorhea. Mouth/Throat: Mucous membranes are moist.  Oropharynx non-erythematous. Cardiovascular: Normal rate, regular rhythm. Grossly normal heart sounds.  Good peripheral circulation. Respiratory: Normal respiratory effort.  No retractions. Lungs CTAB. Gastrointestinal: Soft and nontender. No distention.  Genitourinary: Deferred Musculoskeletal: No lower extremity tenderness nor edema.  Right knee effusion Neurologic:  Normal speech and language. No gross focal neurologic deficits are appreciated.  Skin:  Skin is warm, dry and intact. No rash noted. Psychiatric: Mood and affect are normal.   ____________________________________________   LABS (all labs ordered are listed, but only abnormal results are displayed)  Labs Reviewed - No data to display ____________________________________________  EKG  None ____________________________________________  RADIOLOGY  Thoracic spine 2 view: Unchanged compression deformities of T5 and T7, no acute fracture ____________________________________________   PROCEDURES  Procedure(s) performed: None  Critical Care performed: No  ____________________________________________   INITIAL IMPRESSION / ASSESSMENT AND PLAN / ED COURSE  Pertinent labs & imaging results that were available during my care of the patient were reviewed by me and considered in my medical decision making (see chart for details).  The patient reports that she has pain to her back and feels that she cannot control her legs. We will perform and MRI to evaluate the patients spinal cord for injury given her increased falls and weakness.    The patient's care will be signed out to Dr. Corinna Capra who will follow-up the results of the patient's MRI. The patient's pain is improved after the morphine and  Valium. ____________________________________________   FINAL CLINICAL IMPRESSION(S) / ED DIAGNOSES  Final diagnoses:  Fall  Weakness of both legs      Loney Hering, MD 07/15/14 [phone]

## 2014-07-15 NOTE — Progress Notes (Signed)
Patient arrived around 1330 was alert oriented with pain of 6 on 0-10 scale Dr Annette Stable was notified of patients arrival, daughter is at bed side patient is resting.

## 2014-07-15 NOTE — H&P (Signed)
Grace [name] is an 64 y.o. female.   Chief Complaint: Gait abnormality, thoracic fracture HPI: 64 year old female who suffered a mechanical fall approximately 3 weeks ago. Patient notes that in addition to pain she is also developed difficulty controlling her legs when walking. She denies any true weakness. She is having no sensory symptoms. She has had no bowel or bladder dysfunction. She does have some pain which radiates along her lower costal border bilaterally. Her situation is complicated by multiple medical problems including dilated cardiomyopathy, severe COPD which is been treated with oral steroid-induced for proximally 6 months. Recent presumed community acquired pneumonia, and reactive airway disease. Currently her pain is described as moderate. It worsens with activity. She is mildly short of breath. She has very poor exercise tolerance-does not climb stairs and states that if she did she would have severe difficulty climbing 1 flight.  Past Medical History  Diagnosis Date  . Cardiomyopathy   . Hypertension   . Hyperlipidemia   . Asthma   . COPD (chronic obstructive pulmonary disease)   . PVC's (premature ventricular contractions)     Past Surgical History  Procedure Laterality Date  . Cardiac catheterization    . Cesarean section    . Appendectomy    . Cataract extraction      right eye     Family History  Problem Relation Age of Onset  . Heart attack Father 2    MI x 3    Social History:  reports that she has been smoking Cigarettes.  She has a 25 pack-year smoking history. She does not have any smokeless tobacco history on file. She reports that she does not drink alcohol or use illicit drugs.  Allergies:  Allergies  Allergen Reactions  . Iodine Anaphylaxis    Medications Prior to Admission  Medication Sig Dispense Refill  . albuterol (PROVENTIL HFA;VENTOLIN HFA) 108 (90 BASE) MCG/ACT inhaler Inhale 2 puffs into the lungs every 4 (four) hours as needed.      Marland Kitchen amoxicillin-clavulanate (AUGMENTIN) 875-125 MG per tablet Take 1 tablet by mouth 2 (two) times daily.    . ARIPiprazole (ABILIFY) 5 MG tablet Take 5 mg by mouth daily.    Marland Kitchen azithromycin (ZITHROMAX) 500 MG tablet Take 1 tablet (500 mg total) by mouth daily. 5 tablet 0  . DULoxetine (CYMBALTA) 60 MG capsule Take 60 mg by mouth daily.    Marland Kitchen gabapentin (NEURONTIN) 300 MG capsule Take 300 mg by mouth 4 (four) times daily as needed.    Marland Kitchen ibuprofen (ADVIL,MOTRIN) 100 MG/5ML suspension Take 800 mg by mouth every 6 (six) hours as needed.    Marland Kitchen LORazepam (ATIVAN) 0.5 MG tablet Take 0.5 mg by mouth every 8 (eight) hours as needed for anxiety.    . methocarbamol (ROBAXIN) 500 MG tablet Take 1 tablet (500 mg total) by mouth every 6 (six) hours as needed for muscle spasms. 30 tablet 0  . metoprolol (LOPRESSOR) 100 MG tablet Take 1 tablet (100 mg total) by mouth daily. 30 tablet 0  . modafinil (PROVIGIL) 200 MG tablet Take 200 mg by mouth daily.    . Omega-3 Fatty Acids (FISH OIL) 1200 MG CAPS Take by mouth daily.    . ondansetron (ZOFRAN-ODT) 4 MG disintegrating tablet Take 4 mg by mouth every 8 (eight) hours as needed for nausea or vomiting.    Marland Kitchen oxyCODONE-acetaminophen (PERCOCET/ROXICET) 5-325 MG per tablet Take 2 tablets by mouth every 4 (four) hours as needed for severe pain.    Marland Kitchen  oxyCODONE-acetaminophen (PERCOCET/ROXICET) 5-325 MG per tablet Take 2 tablets by mouth every 6 (six) hours as needed for moderate pain. 30 tablet 0  . promethazine (PHENERGAN) 25 MG tablet Take 50 mg by mouth every 4 (four) hours as needed for nausea or vomiting.    . varenicline (CHANTIX) 1 MG tablet Take 1 mg by mouth 2 (two) times daily.      Results for orders placed or performed during the hospital encounter of 07/15/14 (from the past 48 hour(s))  CBC     Status: Abnormal   Collection Time: 07/15/14  6:54 AM  Result Value Ref Range   WBC 10.2 3.6 - 11.0 K/uL   RBC 4.61 3.80 - 5.20 MIL/uL   Hemoglobin 14.9 12.0 - 16.0  g/dL   HCT 43.3 35.0 - 47.0 %   MCV 93.9 80.0 - 100.0 fL   MCH 32.3 26.0 - 34.0 pg   MCHC 34.3 32.0 - 36.0 g/dL   RDW 14.6 (H) 11.5 - 14.5 %   Platelets 165 150 - 440 K/uL  Basic metabolic panel     Status: Abnormal   Collection Time: 07/15/14  6:54 AM  Result Value Ref Range   Sodium 143 135 - 145 mmol/L   Potassium 4.0 3.5 - 5.1 mmol/L   Chloride 102 101 - 111 mmol/L   CO2 30 22 - 32 mmol/L   Glucose, Bld 161 (H) 65 - 99 mg/dL   BUN 16 6 - 20 mg/dL   Creatinine, Ser 0.79 0.44 - 1.00 mg/dL   Calcium 9.0 8.9 - 10.3 mg/dL   GFR calc non Af Amer >60 >60 mL/min   GFR calc Af Amer >60 >60 mL/min    Comment: (NOTE) The eGFR has been calculated using the CKD EPI equation. This calculation has not been validated in all clinical situations. eGFR's persistently <60 mL/min signify possible Chronic Kidney Disease.    Anion gap 11 5 - 15   Dg Thoracic Spine 2 View  07/15/2014   CLINICAL DATA:  Fall this morning now with back pain. Known T5 and T7 fracture post fall 1 week prior.  EXAM: THORACIC SPINE - 2 VIEW  COMPARISON:  Thoracic spine radiographs 07/02/2014  FINDINGS: Compression deformities of T5 and T7 are unchanged compared to prior exam. No new fracture. The alignment is maintained. Mild endplate spurring and disc space narrowing in the mid lower thoracic spine. No paravertebral soft tissue abnormality.  IMPRESSION: Unchanged compression deformities of T5 and T7.  No acute fracture.   Electronically Signed   By: Melanie  Ehinger M.D.   On: 07/15/2014 06:35   Mr Thoracic Spine W Wo Contrast  07/15/2014   CLINICAL DATA:  Fell on 07/15/2014. Bilateral lower extremity weakness.  EXAM: MRI THORACIC SPINE WITHOUT AND WITH CONTRAST  TECHNIQUE: Multiplanar and multiecho pulse sequences of the thoracic spine were obtained without and with intravenous contrast.  CONTRAST:  16 cc MultiHance  COMPARISON:  Thoracic spine radiographs 07/02/2014 and 07/15/2014  FINDINGS: There is a vertebral plana  appearance of the T5 vertebral body without obvious enhancement. This is likely a remote compression fracture. There is also a remote compression fracture of T7. Mild compression deformity of T6. There is marrow signal abnormality in the T4 vertebral body along with contrast enhancement. There is also some contrast enhancement in the upper aspect of T6. There is also significant enhancement around the vertebral bodies.  The patient has severe underlying epidural lipomatosis and that in combination with the compression deformity and possible   hematoma, edema or tumor ventrally is contributing to severe cord compression. There is no appreciable CSF identified around the cervical spinal cord extending from the midbody of T4 down to the lower aspect of T6.  No cord signal abnormality is demonstrated or abnormal cord enhancement. The other thoracic vertebral bodies are maintained. No foraminal lesions.  IMPRESSION: 1. Significant cord compression from the midbody of T4 to the bottom of T6. This may all be related to trauma with edema, fluid and hemorrhage but could not exclude underlying pathologic process such as infection or tumor. Recommend neurosurgical consultation. The patient's cord compression is compound did by a significant epidural lipomatosis. 2. Remote appearing compression fractures of T5 and T7. 3. Mild edema and enhancement in the T4 and T6 vertebral bodies could be related to trauma/reactive changes or possible underlying pathologic process.   Electronically Signed   By: Marijo Sanes M.D.   On: 07/15/2014 11:07    Pertinent items are noted in HPI.  Blood pressure 157/92, pulse 73, temperature 97.8 F (36.6 C), temperature source Oral, height 5' 5" (1.651 m), weight 77.021 kg (169 lb 12.8 oz), SpO2 99 %.  The patient is awake and alert. She is oriented and appropriate. She appears to be in no distress currently. Her skin is somewhat ashen. She is mild tachypnea. There are mild wheezes bilaterally.  Heart is regular rate and rhythm. Abdomen is soft, nondistended and nontender. Examination of her extremities are normal bilaterally. Examination of her mid thoracic region reveals some mild tenderness. She has some degree of kyphosis. Neurologically as stated above she is awake and alert. She is oriented and appropriate. Her speech is fluent. Cranial nerve function is intact bilaterally. Motor examination is 5/5 in bilateral upper and lower extremities. Sensory examination is normal bilaterally. Reflexes are normal. Heel shin testing is normal bilaterally. Proprioception is present and intact in both upper and lower extremities. I did not test her gait. Assessment/Plan  the patient's multiple medical problems make any type of treatment extremely problematic. She is not awake candidate for kyphoplasty/vertebral plasty at the T5 level. Her T7 fracture is minimal and does not require treatment. She is not a candidate for any type of anterior decompression/reconstruction because of her cardiac and pulmonary disease but also because of her bone quality. She could potentially benefit from posterior decompression infusion but this would be fraught with potential risks and complications given her poor medical health and her very poor bone quality secondary to chronic tobacco abuse and long-term prednisone use. Currently I think her least bad option is bracing with gait training utilizing physical therapy. I will obtain an echocardiogram and a CT scan of her thoracic spine. I will call [employer] for placement of a TLSO. I discussed situation with patient and her daughter and they are in agreement. Georgena Weisheit A 07/15/2014, 3:23 PM    r

## 2014-07-15 NOTE — ED Notes (Signed)
Patient transported to MRI 

## 2014-07-15 NOTE — ED Notes (Signed)
Pt transferred to [facility] by [facility]

## 2014-07-15 NOTE — Consult Note (Signed)
[facility] History and Physical  Grace [name] KXF:[id number] DOB: Jan 10, 1951 DOA: 07/15/2014  Referring physician: Dr. Annette Stable PCP: [name], MD   Chief Complaint: Hypertension, COPD and cardiomyopathy management  HPI: Grace [name] is a 64 y.o. female with past medical history of COPD was recently discharged from [facility] after she was admitted for COPD exacerbation. Patient fell about 3 weeks ago, while she was in the hospital over there complaining about back pain, but she was able to walk around without any problems. When she went home she developed gait abnormalities and she came back to the hospital for further evaluation. In the ED MRI of the T-spine showed significant cord compression to the bottom of T6. This may all be related to trauma. [facility] being consulted for medical management.  Review of Systems:  Constitutional: negative for anorexia, fevers and sweats Eyes: negative for irritation, redness and visual disturbance Ears, nose, mouth, throat, and face: negative for earaches, epistaxis, nasal congestion and sore throat Respiratory:some cough. No fever or chills. Cardiovascular: negative for chest pain, dyspnea, lower extremity edema, orthopnea, palpitations and syncope Gastrointestinal: negative for abdominal pain, constipation, diarrhea, melena, nausea and vomiting Genitourinary:negative for dysuria, frequency and hematuria Hematologic/lymphatic: negative for bleeding, easy bruising and lymphadenopathy Musculoskeletal:negative for arthralgias, muscle weakness and stiff joints Neurological: negative for coordination problems, gait problems, headaches and weakness Endocrine: negative for diabetic symptoms including polydipsia, polyuria and weight loss Allergic/Immunologic: negative for anaphylaxis, hay fever and urticaria  Past Medical History  Diagnosis Date  . Cardiomyopathy   . Hypertension   . Hyperlipidemia   . Asthma   . COPD (chronic  obstructive pulmonary disease)   . PVC's (premature ventricular contractions)    Past Surgical History  Procedure Laterality Date  . Cardiac catheterization    . Cesarean section    . Appendectomy    . Cataract extraction      right eye    Social History:   reports that she has been smoking Cigarettes.  She has a 25 pack-year smoking history. She does not have any smokeless tobacco history on file. She reports that she does not drink alcohol or use illicit drugs.  Allergies  Allergen Reactions  . Iodine Anaphylaxis    Family History  Problem Relation Age of Onset  . Heart attack Father 24    MI x 3      Prior to Admission medications   Medication Sig Start Date End Date Taking? Authorizing Provider  albuterol (PROVENTIL HFA;VENTOLIN HFA) 108 (90 BASE) MCG/ACT inhaler Inhale 2 puffs into the lungs every 4 (four) hours as needed.  12/23/13   Historical Provider, MD  amoxicillin-clavulanate (AUGMENTIN) 875-125 MG per tablet Take 1 tablet by mouth 2 (two) times daily.    Historical Provider, MD  ARIPiprazole (ABILIFY) 5 MG tablet Take 5 mg by mouth daily.    Historical Provider, MD  azithromycin (ZITHROMAX) 500 MG tablet Take 1 tablet (500 mg total) by mouth daily. 07/02/14   Epifanio Lesches, MD  DULoxetine (CYMBALTA) 60 MG capsule Take 60 mg by mouth daily.    Historical Provider, MD  gabapentin (NEURONTIN) 300 MG capsule Take 300 mg by mouth 4 (four) times daily as needed.    Historical Provider, MD  ibuprofen (ADVIL,MOTRIN) 100 MG/5ML suspension Take 800 mg by mouth every 6 (six) hours as needed.    Historical Provider, MD  LORazepam (ATIVAN) 0.5 MG tablet Take 0.5 mg by mouth every 8 (eight) hours as needed for anxiety.  Historical Provider, MD  methocarbamol (ROBAXIN) 500 MG tablet Take 1 tablet (500 mg total) by mouth every 6 (six) hours as needed for muscle spasms. 07/02/14   Epifanio Lesches, MD  metoprolol (LOPRESSOR) 100 MG tablet Take 1 tablet (100 mg total) by mouth  daily. 07/02/14   Epifanio Lesches, MD  modafinil (PROVIGIL) 200 MG tablet Take 200 mg by mouth daily.    Historical Provider, MD  Omega-3 Fatty Acids (FISH OIL) 1200 MG CAPS Take by mouth daily.    Historical Provider, MD  ondansetron (ZOFRAN-ODT) 4 MG disintegrating tablet Take 4 mg by mouth every 8 (eight) hours as needed for nausea or vomiting.    Historical Provider, MD  oxyCODONE-acetaminophen (PERCOCET/ROXICET) 5-325 MG per tablet Take 2 tablets by mouth every 4 (four) hours as needed for severe pain.    Historical Provider, MD  oxyCODONE-acetaminophen (PERCOCET/ROXICET) 5-325 MG per tablet Take 2 tablets by mouth every 6 (six) hours as needed for moderate pain. 07/02/14   Epifanio Lesches, MD  promethazine (PHENERGAN) 25 MG tablet Take 50 mg by mouth every 4 (four) hours as needed for nausea or vomiting.    Historical Provider, MD  varenicline (CHANTIX) 1 MG tablet Take 1 mg by mouth 2 (two) times daily.    Historical Provider, MD   Physical Exam: Filed Vitals:   07/15/14 1730  BP: 168/83  Pulse: 77  Temp: 97.7 F (36.5 C)  Resp: 18   Constitutional: Oriented to person, place, and time. Well-developed and well-nourished. Cooperative.  Head: Normocephalic and atraumatic.  Nose: Nose normal.  Mouth/Throat: Uvula is midline, oropharynx is clear and moist and mucous membranes are normal.  Eyes: Conjunctivae and EOM are normal. Pupils are equal, round, and reactive to light.  Neck: Trachea normal and normal range of motion. Neck supple.  Cardiovascular: Normal rate, regular rhythm, S1 normal, S2 normal, normal heart sounds and intact distal pulses.   Pulmonary/Chest: Effort normal and breath sounds normal.  Abdominal: Soft. Bowel sounds are normal. There is no hepatosplenomegaly. There is no tenderness.  Musculoskeletal: Normal range of motion.  Neurological: Alert and oriented to person, place, and time. Has normal strength. No cranial nerve deficit or sensory deficit.  Skin:  Skin is warm, dry and intact.  Psychiatric: Has a normal mood and affect. Speech is normal and behavior is normal.   Labs on Admission:  Basic Metabolic Panel:  Recent Labs Lab 07/15/14 0654  NA 143  K 4.0  CL 102  CO2 30  GLUCOSE 161*  BUN 16  CREATININE 0.79  CALCIUM 9.0   Liver Function Tests: No results for input(s): AST, ALT, ALKPHOS, BILITOT, PROT, ALBUMIN in the last 168 hours. No results for input(s): LIPASE, AMYLASE in the last 168 hours. No results for input(s): AMMONIA in the last 168 hours. CBC:  Recent Labs Lab 07/15/14 0654  WBC 10.2  HGB 14.9  HCT 43.3  MCV 93.9  PLT 165   Cardiac Enzymes: No results for input(s): CKTOTAL, CKMB, CKMBINDEX, TROPONINI in the last 168 hours.  BNP (last 3 results) No results for input(s): BNP in the last 8760 hours.  ProBNP (last 3 results) No results for input(s): PROBNP in the last 8760 hours.  CBG: No results for input(s): GLUCAP in the last 168 hours.  Radiological Exams on Admission: Dg Thoracic Spine 2 View  07/15/2014   CLINICAL DATA:  Fall this morning now with back pain. Known T5 and T7 fracture post fall 1 week prior.  EXAM: THORACIC SPINE -  2 VIEW  COMPARISON:  Thoracic spine radiographs 07/02/2014  FINDINGS: Compression deformities of T5 and T7 are unchanged compared to prior exam. No new fracture. The alignment is maintained. Mild endplate spurring and disc space narrowing in the mid lower thoracic spine. No paravertebral soft tissue abnormality.  IMPRESSION: Unchanged compression deformities of T5 and T7.  No acute fracture.   Electronically Signed   By: Jeb Levering M.D.   On: 07/15/2014 06:35   Mr Thoracic Spine W Wo Contrast  07/15/2014   CLINICAL DATA:  [place] on 07/15/2014. Bilateral lower extremity weakness.  EXAM: MRI THORACIC SPINE WITHOUT AND WITH CONTRAST  TECHNIQUE: Multiplanar and multiecho pulse sequences of the thoracic spine were obtained without and with intravenous contrast.  CONTRAST:  16  cc MultiHance  COMPARISON:  Thoracic spine radiographs 07/02/2014 and 07/15/2014  FINDINGS: There is a vertebral plana appearance of the T5 vertebral body without obvious enhancement. This is likely a remote compression fracture. There is also a remote compression fracture of T7. Mild compression deformity of T6. There is marrow signal abnormality in the T4 vertebral body along with contrast enhancement. There is also some contrast enhancement in the upper aspect of T6. There is also significant enhancement around the vertebral bodies.  The patient has severe underlying epidural lipomatosis and that in combination with the compression deformity and possible hematoma, edema or tumor ventrally is contributing to severe cord compression. There is no appreciable CSF identified around the cervical spinal cord extending from the midbody of T4 down to the lower aspect of T6.  No cord signal abnormality is demonstrated or abnormal cord enhancement. The other thoracic vertebral bodies are maintained. No foraminal lesions.  IMPRESSION: 1. Significant cord compression from the midbody of T4 to the bottom of T6. This may all be related to trauma with edema, fluid and hemorrhage but could not exclude underlying pathologic process such as infection or tumor. Recommend neurosurgical consultation. The patient's cord compression is compound did by a significant epidural lipomatosis. 2. Remote appearing compression fractures of T5 and T7. 3. Mild edema and enhancement in the T4 and T6 vertebral bodies could be related to trauma/reactive changes or possible underlying pathologic process.   Electronically Signed   By: Marijo Sanes M.D.   On: 07/15/2014 11:07    EKGNot done.nt/Plan Principal Problem:   Thoracic compression fracture Active Problems:   COPD (chronic obstructive pulmonary disease) with chronic bronchitis   Congestive dilated cardiomyopathy    COPD   Without acute bronchitis, patient has cough, and minimal  sputum production. Recently treated and [facility] for COPD exacerbation. We'll continue bronchial dilators, antitussives and mucolytics. Incentive spirometry. No indication for steroids or antibiotics from COPD standpoint.  History of congestive cardiomyopathy Patient denies any symptoms consistent with CHF, reported last echo was about 10 years ago. Obtain 2-D echocardiogram in case patient did not progress well with physical therapy and required surgery.  Thoracic compression fracture Likely secondary to the recent fall, patient had dilated complications with gait instability. 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 Trenton Gammon no plans for surgery. TLSO brace and PT/OT.  History of nicotine abuse Reported that she quit about 2 weeks ago.  Time spent: 60 minutes  Cleola Perryman A, MD [facility] Pager [phone]

## 2014-07-15 NOTE — ED Notes (Signed)
Report from Mallie Mussel, [place]

## 2014-07-15 NOTE — ED Notes (Signed)
Pt to triage via w/c with no distress noted, brought in by EMS; pt reports lost balance tonight & fell about 315am; c/o pain to mid back since; denies any other c/o or injuries

## 2014-07-15 NOTE — ED Notes (Signed)
Attempted to give report to 4N at [facility] to take at this time

## 2014-07-15 NOTE — ED Notes (Signed)
MD at bedside. 

## 2014-07-16 ENCOUNTER — Inpatient Hospital Stay (HOSPITAL_COMMUNITY): Payer: 59

## 2014-07-16 DIAGNOSIS — I509 Heart failure, unspecified: Secondary | ICD-10-CM

## 2014-07-16 DIAGNOSIS — S22000S Wedge compression fracture of unspecified thoracic vertebra, sequela: Secondary | ICD-10-CM

## 2014-07-16 DIAGNOSIS — I10 Essential (primary) hypertension: Secondary | ICD-10-CM

## 2014-07-16 LAB — CBC
HCT: 40.3 % (ref 36.0–46.0)
HEMOGLOBIN: 13.4 g/dL (ref 12.0–15.0)
MCH: 31.4 pg (ref 26.0–34.0)
MCHC: 33.3 g/dL (ref 30.0–36.0)
MCV: 94.4 fL (ref 78.0–100.0)
PLATELETS: 153 10*3/uL (ref 150–400)
RBC: 4.27 MIL/uL (ref 3.87–5.11)
RDW: 13.8 % (ref 11.5–15.5)
WBC: 8.4 10*3/uL (ref 4.0–10.5)

## 2014-07-16 LAB — BASIC METABOLIC PANEL
ANION GAP: 10 (ref 5–15)
BUN: 28 mg/dL — AB (ref 6–20)
CALCIUM: 9.3 mg/dL (ref 8.9–10.3)
CHLORIDE: 100 mmol/L — AB (ref 101–111)
CO2: 26 mmol/L (ref 22–32)
Creatinine, Ser: 0.92 mg/dL (ref 0.44–1.00)
GFR calc Af Amer: >60 mL/min (ref >60)
GLUCOSE: 130 mg/dL — AB (ref 65–99)
Potassium: 3.8 mmol/L (ref 3.5–5.1)
Sodium: 136 mmol/L (ref 135–145)

## 2014-07-16 MED ORDER — LOSARTAN POTASSIUM 50 MG PO TABS
25.0000 mg | ORAL_TABLET | Freq: Every day | ORAL | Status: DC
  Administered 2014-07-16 – 2014-07-17 (×2): 25 mg via ORAL
  Filled 2014-07-16 (×2): qty 1

## 2014-07-16 NOTE — Progress Notes (Signed)
   07/16/14 1430  What Happened  Was fall witnessed? No  Was patient injured? No  Patient found on floor  Found by Staff-comment  Stated prior activity other (comment) (sitting on side of bed and reached to floor)  Follow Up  MD notified yes (Dr. Sherwood Gambler)  Time MD notified Clay notified Yes-comment (daughter)  Time family notified 1800  Additional tests No  Progress note created (see row info) Yes  Adult Fall Risk Assessment  Risk Factor Category (scoring not indicated) History of more than one fall within 6 months before admission (document High fall risk)  Age 64  Fall History: Fall within 6 months prior to admission 0  Elimination; Bowel and/or Urine Incontinence 0  Elimination; Bowel and/or Urine Urgency/Frequency 2  Medications: includes PCA/Opiates, Anti-convulsants, Anti-hypertensives, Diuretics, Hypnotics, Laxatives, Sedatives, and Psychotropics 5  Patient Care Equipment 2  Mobility-Assistance 2  Mobility-Gait 2  Mobility-Sensory Deficit 0  Cognition-Awareness 0  Cognition-Impulsiveness 2  Cognition-Limitations 0  Total Score 16  Patient's Fall Risk High Fall Risk (>13 points)  Adult Fall Risk Interventions  Required Bundle Interventions *See Row Information* High fall risk - low, moderate, and high requirements implemented  Additional Interventions Fall risk signage;Protective devices (Comment);Secure all tubes/drains;Use of appropriate toileting equipment (bedpan, BSC, etc.) (mat beside bed)  Fall with Injury Screening  Risk For Fall Injury- See Row Information  F;Nurse judgement  Intervention(s) for 2 or more risk criteria identified Low Bed;Floor Mat

## 2014-07-16 NOTE — Progress Notes (Signed)
Brace vender came with brace was not the right size will return.

## 2014-07-16 NOTE — Progress Notes (Signed)
Orthopedic Tech Progress Note Patient Details:  RHIAN ASEBEDO 1950/09/28 [id number]  Patient ID: [name], female   DOB: June 04, 1950, 64 y.o.   MRN: [id number] Called in [employer] brace order; spoke with Riley Nearing, Tauni Sanks 07/16/2014, 11:14 AM

## 2014-07-16 NOTE — Evaluation (Signed)
Physical Therapy Evaluation Patient Details Name: Grace [name] MRN: [id number] DOB: 1950-05-17 Today's Date: 07/16/2014   History of Present Illness  64 year old female who suffered a mechanical fall approximately 3 weeks ago. Patient notes that in addition to pain she is also developed difficulty controlling her legs when walking. She denies any true weakness. She is having no sensory symptoms. She has had no bowel or bladder dysfunction. She does have some pain which radiates along her lower costal border bilaterally. Her situation is complicated by multiple medical problems including dilated cardiomyopathy, severe COPD which is been treated with oral steroid-induced for proximally 6 months. Recent presumed community acquired pneumonia, and reactive airway disease. Currently her pain is described as moderate. It worsens with activity. She is mildly short of breath. She has very poor exercise tolerance-does not climb stairs and states that if she did she would have severe difficulty climbing 1 flight. Currently awaiting TLSO and no surgical management was recommended per MD.  Clinical Impression  Pt presents with the below listed impairments and will benefit from skilled PT intervention to address these impairments and increase functional independence prior to d/c home with support from pt's daughter. Pt at increased risk of fall due to decreased coordination/ataxia in BLE with instability with gait. Recommending use of RW at this time for mobility. Awaiting TLSO, so limited mobility and gait performed on evaluation and further education will need to be done to determine if pt able to don/doff TLSO independently or require assist at d/c. Pt reports she may have surgery but note from neurosurgeon states plan is for conservative management at this time.    Follow Up Recommendations Home health PT;Supervision - Intermittent;Supervision for mobility/OOB (May require physical A for donning of TLSO as  well.)    Equipment Recommendations  Rolling walker with 5" wheels    Recommendations for Other Services       Precautions / Restrictions Precautions Precautions: Back;Fall Precaution Comments: Pt requires education on back precautions and cues to adhere Required Braces or Orthoses: Spinal Brace Spinal Brace: Thoracolumbosacral orthotic (orders to be clarified. Pt has not yet been fit for it) Restrictions Weight Bearing Restrictions: No      Mobility  Bed Mobility Overal bed mobility: Needs Assistance Bed Mobility: Supine to Sit;Sit to Supine     Supine to sit: Supervision Sit to supine: Supervision   General bed mobility comments: requires cues to adhere to back precautions and use of log roll technique  Transfers Overall transfer level: Needs assistance Equipment used: Rolling walker (2 wheeled) Transfers: Sit to/from Stand Sit to Stand: Min guard         General transfer comment: cues for hand placement and technique; min A for balance due to ataxic movements in BLE during transfer and poor standing balance without  UE suport  Ambulation/Gait Ambulation/Gait assistance: Min assist Ambulation Distance (Feet): 5 Feet Assistive device: Rolling walker (2 wheeled) Gait Pattern/deviations: Ataxic;Decreased stride length;Trunk flexed     General Gait Details: only limited trial to get to and from chair as pt has still not received TLSO per order. Per RN pt is ambulating to bathroom with assist.  Stairs            Wheelchair Mobility    Modified Rankin (Stroke Patients Only)       Balance Overall balance assessment: Needs assistance;History of Falls Sitting-balance support: No upper extremity supported;Feet supported Sitting balance-Leahy Scale: Good Sitting balance - Comments: reports discomfort in back with sitting without support  Standing balance support: Bilateral upper extremity supported;No upper extremity supported;Single extremity  supported;During functional activity Standing balance-Leahy Scale: Fair Standing balance comment: In standing without UE support demonstrates anterior and posterior sway due to impaired balance reactions. Requires support after about 30 seconds                             Pertinent Vitals/Pain Pain Assessment: 0-10 Pain Score: 5  Pain Location: flank area  Pain Descriptors / Indicators: Aching Pain Intervention(s): Monitored during session;RN gave pain meds during session    [place] expects to be discharged to:: Private residence Living Arrangements: Spouse/significant other Available Help at Discharge: Available 24 hours/day;Available PRN/intermittently;Family Type of Home: House Home Access: Stairs to enter Entrance Stairs-Rails:  (Pt unable to remember which side. Reports its not very sturd) Entrance Stairs-Number of Steps: 7 Home Layout: Laundry or work area in basement;Able to live on main level with bedroom/bathroom;Two level   Additional Comments: Pt reports her husband is disabled and unable to provide much assistance. Pt reports her daughter can help her with household needs and assist as needed at d/c. She has done this for her before.    Prior Function Level of Independence: Independent         Comments: Pt working full time as Therapist, sports at [employer].     Hand Dominance        Extremity/Trunk Assessment   Upper Extremity Assessment: Defer to OT evaluation           Lower Extremity Assessment: RLE deficits/detail;LLE deficits/detail RLE Deficits / Details: grossly 4/5 strength; ataxic during transfer/gait LLE Deficits / Details: grossly 4/5 strength; ataxic during gait/transfers  Cervical / Trunk Assessment: Other exceptions  Communication   Communication: No difficulties  Cognition Arousal/Alertness: Awake/alert Behavior During Therapy: WFL for tasks assessed/performed Overall Cognitive Status: Within Functional Limits for tasks  assessed       Memory: Decreased recall of precautions              General Comments General comments (skin integrity, edema, etc.): bruising on R knee from fall prior to coming into the hospital per patient report    Exercises        Assessment/Plan    PT Assessment Patient needs continued PT services  PT Diagnosis Difficulty walking;Generalized weakness;Acute pain   PT Problem List Decreased strength;Decreased range of motion;Decreased activity tolerance;Decreased balance;Decreased mobility;Decreased coordination;Decreased knowledge of use of DME;Decreased knowledge of precautions;Cardiopulmonary status limiting activity;Decreased skin integrity;Pain  PT Treatment Interventions DME instruction;Gait training;Stair training;Functional mobility training;Therapeutic activities;Therapeutic exercise;Balance training;Neuromuscular re-education;Patient/family education;Wheelchair mobility training;Modalities   PT Goals (Current goals can be found in the Care Plan section) Acute Rehab PT Goals Patient Stated Goal: go home soon PT Goal Formulation: With patient Time For Goal Achievement: 07/30/14 Potential to Achieve Goals: Good    Frequency Min 5X/week   Barriers to discharge Inaccessible home environment;Decreased caregiver support Husband is disabled and can provide limited assistance. Pt reports daughter can provide assist with household and stair neogitaiton for home entry but need to confirm with pt's daughter her availablity.    Co-evaluation               End of Session Equipment Utilized During Treatment: Gait belt Activity Tolerance: Patient tolerated treatment well Patient left: in bed;with call bell/phone within reach;with bed alarm set Nurse Communication: Mobility status;Precautions;Other (comment) (discussed order for TLSO)         Time: 7124-5809 PT  Time Calculation (min) (ACUTE ONLY): 25 min   Charges:   PT Evaluation $Initial PT Evaluation Tier  I: 1 Procedure PT Treatments $Therapeutic Activity: 8-22 mins   PT G Codes:        Canary Brim Ivory Broad, PT, DPT  07/16/2014, 9:13 AM

## 2014-07-16 NOTE — Progress Notes (Signed)
PROGRESS NOTE  Grace [name] MWU:[id number] DOB: [birth year] DOA: 07/15/2014 PCP: [name], MD  Assessment/Plan: COPD  Without acute bronchitis, patient has cough, and minimal sputum production. Recently treated at [facility] for COPD exacerbation. -continue bronchial dilators, antitussives and mucolytics. Incentive spirometry. No indication for steroids or antibiotics from COPD standpoint.  History of congestive cardiomyopathy Patient denies any symptoms consistent with CHF, reported last echo was about 10 years ago. Obtain 2-D echocardiogram if patient needs surgery or shows symptoms  Thoracic compression fracture Likely secondary to the recent fall, patient had dilated complications with gait instability. 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 Trenton Gammon no plans for surgery. TLSO brace and PT/OT.  History of nicotine abuse Reported that she quit about 2 weeks ago.  HTN -continue home meds and PRN ? Pain componant  Code Status: full Family Communication: patient Disposition Plan: per primary    HPI/Subjective: No SOB, no CP Worried about blood pressure Asking for stress test, colonoscopy and EGDto be done while in the hospital  Objective: Filed Vitals:   07/16/14 0905  BP: 130/77  Pulse: 84  Temp: 98.1 F (36.7 C)  Resp: 20    Intake/Output Summary (Last 24 hours) at 07/16/14 1053 Last data filed at 07/15/14 1700  Gross per 24 hour  Intake    240 ml  Output      0 ml  Net    240 ml   Filed Weights   07/15/14 1400  Weight: 77.021 kg (169 lb 12.8 oz)    Exam:   General:  A+Ox3, NAD- mildly tremulous  Cardiovascular: rrr  Respiratory: diminished had back brace on  Abdomen: +BS, soft  Musculoskeletal: no edema   Data Reviewed: Basic Metabolic Panel:  Recent Labs Lab 07/15/14 0654 07/16/14 0620  NA 143 136  K 4.0 3.8  CL 102 100*  CO2 30 26  GLUCOSE 161* 130*  BUN 16 28*  CREATININE 0.79 0.92    CALCIUM 9.0 9.3   Liver Function Tests: No results for input(s): AST, ALT, ALKPHOS, BILITOT, PROT, ALBUMIN in the last 168 hours. No results for input(s): LIPASE, AMYLASE in the last 168 hours. No results for input(s): AMMONIA in the last 168 hours. CBC:  Recent Labs Lab 07/15/14 0654 07/16/14 0620  WBC 10.2 8.4  HGB 14.9 13.4  HCT 43.3 40.3  MCV 93.9 94.4  PLT 165 153   Cardiac Enzymes: No results for input(s): CKTOTAL, CKMB, CKMBINDEX, TROPONINI in the last 168 hours. BNP (last 3 results) No results for input(s): BNP in the last 8760 hours.  ProBNP (last 3 results) No results for input(s): PROBNP in the last 8760 hours.  CBG: No results for input(s): GLUCAP in the last 168 hours.  No results found for this or any previous visit (from the past 240 hour(s)).   Studies: Dg Thoracic Spine 2 View  07/15/2014   CLINICAL DATA:  Fall this morning now with back pain. Known T5 and T7 fracture post fall 1 week prior.  EXAM: THORACIC SPINE - 2 VIEW  COMPARISON:  Thoracic spine radiographs 07/02/2014  FINDINGS: Compression deformities of T5 and T7 are unchanged compared to prior exam. No new fracture. The alignment is maintained. Mild endplate spurring and disc space narrowing in the mid lower thoracic spine. No paravertebral soft tissue abnormality.  IMPRESSION: Unchanged compression deformities of T5 and T7.  No acute fracture.   Electronically Signed   By: Jeb Levering M.D.   On: 07/15/2014 06:35  Ct Thoracic Spine Wo Contrast  07/15/2014   CLINICAL DATA:  Thoracic spine pain, known fractures. Known cord compression.  EXAM: CT THORACIC SPINE WITHOUT CONTRAST  TECHNIQUE: Multidetector CT imaging of the thoracic spine was performed without intravenous contrast administration. Multiplanar CT image reconstructions were also generated.  COMPARISON:  MRI thoracic spine from [facility] Jul 15, 2014 at 0958 hours  FINDINGS: Again noted is T5 vertebral plana with vacuum disc and intraosseous air.  Moderate T7 compression fracture with approximately 50% anterior wedging. Mild T6 superior endplate compression fracture with less than 20% superior endplate height loss. The remaining thoracic vertebral bodies intact. Accentuated thoracic kyphosis at T5. No malalignment. Osteopenia without destructive bony lesions.  Prominent posterior epidural fat. Included prevertebral and paraspinal soft tissues are nonsuspicious. Moderate calcific atherosclerosis of the included thoracic aorta.  Retropulsed bony fragments at T5 results in very mild osseous canal stenosis. Severe RIGHT, moderate LEFT T5-6 neural foraminal narrowing. Mild LEFT T4-5 neural foraminal narrowing.  IMPRESSION: T5 vertebral plana, with benign imaging features. Moderate T7 and mild T6 compression fractures. Osteopenia suggesting fracture insufficiency etiology. Accentuated upper thoracic kyphosis without malalignment.  Very mild osseous canal stenosis at T5. Severe RIGHT, moderate LEFT T5-6 neural foraminal narrowing. Mild LEFT T4-5 neural foraminal narrowing.   Electronically Signed   By: Elon Alas M.D.   On: 07/15/2014 23:37   Mr Thoracic Spine W Wo Contrast  07/15/2014   CLINICAL DATA:  [place] on 07/15/2014. Bilateral lower extremity weakness.  EXAM: MRI THORACIC SPINE WITHOUT AND WITH CONTRAST  TECHNIQUE: Multiplanar and multiecho pulse sequences of the thoracic spine were obtained without and with intravenous contrast.  CONTRAST:  16 cc MultiHance  COMPARISON:  Thoracic spine radiographs 07/02/2014 and 07/15/2014  FINDINGS: There is a vertebral plana appearance of the T5 vertebral body without obvious enhancement. This is likely a remote compression fracture. There is also a remote compression fracture of T7. Mild compression deformity of T6. There is marrow signal abnormality in the T4 vertebral body along with contrast enhancement. There is also some contrast enhancement in the upper aspect of T6. There is also significant enhancement  around the vertebral bodies.  The patient has severe underlying epidural lipomatosis and that in combination with the compression deformity and possible hematoma, edema or tumor ventrally is contributing to severe cord compression. There is no appreciable CSF identified around the cervical spinal cord extending from the midbody of T4 down to the lower aspect of T6.  No cord signal abnormality is demonstrated or abnormal cord enhancement. The other thoracic vertebral bodies are maintained. No foraminal lesions.  IMPRESSION: 1. Significant cord compression from the midbody of T4 to the bottom of T6. This may all be related to trauma with edema, fluid and hemorrhage but could not exclude underlying pathologic process such as infection or tumor. Recommend neurosurgical consultation. The patient's cord compression is compound did by a significant epidural lipomatosis. 2. Remote appearing compression fractures of T5 and T7. 3. Mild edema and enhancement in the T4 and T6 vertebral bodies could be related to trauma/reactive changes or possible underlying pathologic process.   Electronically Signed   By: Marijo Sanes M.D.   On: 07/15/2014 11:07    Scheduled Meds: . amoxicillin-clavulanate  1 tablet Oral BID  . ARIPiprazole  5 mg Oral Daily  . azithromycin  500 mg Oral Daily  . DULoxetine  60 mg Oral Daily  . gabapentin  300 mg Oral TID  . guaiFENesin  1,200 mg Oral BID  .  metoprolol  100 mg Oral Daily  . modafinil  200 mg Oral Daily  . omega-3 acid ethyl esters  1 g Oral Daily  . varenicline  1 mg Oral BID   Continuous Infusions:  Antibiotics Given (last 72 hours)    Date/Time Action Medication Dose   07/15/14 1757 Given   azithromycin (ZITHROMAX) tablet 500 mg 500 mg   07/15/14 1832 Given   amoxicillin-clavulanate (AUGMENTIN) 875-125 MG per tablet 1 tablet 1 tablet      Principal Problem:   Thoracic compression fracture Active Problems:   COPD (chronic obstructive pulmonary disease) with chronic  bronchitis   Congestive dilated cardiomyopathy    Time spent: 25 min    Alanda Colton  [facility] Pager [phone]. If 7PM-7AM, please contact night-coverage at [email or web address], password [facility] 07/16/2014, 10:53 AM  LOS: 1 day

## 2014-07-16 NOTE — Evaluation (Signed)
Occupational Therapy Evaluation Patient Details Name: Grace [name] MRN: [id number] DOB: 06-25-1950 Today's Date: 07/16/2014    History of Present Illness 64 year old female who suffered a mechanical fall approximately 3 weeks ago. Patient notes that in addition to pain she is also developed difficulty controlling her legs when walking. She denies any true weakness. She is having no sensory symptoms. She has had no bowel or bladder dysfunction. She does have some pain which radiates along her lower costal border bilaterally. Her situation is complicated by multiple medical problems including dilated cardiomyopathy, severe COPD which is been treated with oral steroid-induced for proximally 6 months. Recent presumed community acquired pneumonia, and reactive airway disease. Currently her pain is described as moderate. It worsens with activity. She is mildly short of breath. She has very poor exercise tolerance-does not climb stairs and states that if she did she would have severe difficulty climbing 1 flight. Currently awaiting TLSO and no surgical management was recommended per MD.   Clinical Impression   Pt exiting bed upon OT arrival with bed alarm ringing. Pt not yet fitted for TLSO, conservative treatment at this time. Pt evaluated EOB, required constant cuing for back precautions. Education and handout provided on back precautions and maintaining precautions during ADLs. Pt reported being primary caregiver for disabled spouse, daughter local and available to assist twice daily. Pt reports mother in law, age 17, able to help upon d/c. Pt would benefit from skilled OT services for assistance with ADLs while maintaining precautions, and reinforcement of back precautions during functional activities.  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     Follow Up Recommendations  Home health OT;Supervision - Intermittent    Equipment Recommendations       Recommendations  for Other Services       Precautions / Restrictions Precautions Precautions: Back;Fall Precaution Booklet Issued: Yes (comment) Precaution Comments: back precaution handout provided Required Braces or Orthoses: Spinal Brace Spinal Brace: Thoracolumbosacral orthotic (Pt has not yet been fit for it) Restrictions Weight Bearing Restrictions: No      Mobility Bed Mobility Overal bed mobility: Needs Assistance Bed Mobility: Sit to Sidelying;Rolling Rolling: Supervision       Sit to sidelying: Supervision General bed mobility comments: requires cues to adhere to back precautions and use of log roll technique  Transfers Overall transfer level: Needs assistance Equipment used: Rolling walker (2 wheeled) Transfers: Sit to/from Stand Sit to Stand: Min guard         General transfer comment: cues for hand placement and technique; min A for balance     Balance Overall balance assessment: Needs assistance Sitting-balance support: Feet supported;No upper extremity supported       Standing balance support: Bilateral upper extremity supported                                ADL Overall ADL's : Needs assistance/impaired Eating/Feeding: Independent   Grooming: Standing (cuing for precautions)   Upper Body Bathing: Sitting;Cueing for safety Upper Body Bathing Details (indicate cue type and reason): education provided on not washing hair in kitchen sink due to back precautions Lower Body Bathing: Minimal assistance;Sit to/from stand   Upper Body Dressing : Bed level   Lower Body Dressing: Minimal assistance;Cueing for back precautions;Sit to/from stand   Toilet Transfer: Min guard;BSC;RW   Toileting- Clothing Manipulation and Hygiene: Minimal assistance;Cueing for back precautions;Sit to/from stand  Tub/ Shower Transfer: Tub transfer;Shower seat;Rolling walker   Functional mobility during ADLs: Minimal assistance;Rolling walker General ADL Comments: Pt needs  constant cuing to maintain back precautions. Unable to complete full ADL assessment at this time due to Pt not having TLSO     Vision Vision Assessment?: No apparent visual deficits   Perception     Praxis      Pertinent Vitals/Pain Pain Assessment: 0-10 Pain Score: 7  Pain Descriptors / Indicators: Aching Pain Intervention(s): Limited activity within patient's tolerance;Monitored during session;Repositioned;RN gave pain meds during session     Hand Dominance Right   Extremity/Trunk Assessment Upper Extremity Assessment Upper Extremity Assessment: Overall WFL for tasks assessed   Lower Extremity Assessment Lower Extremity Assessment: Defer to PT evaluation   Cervical / Trunk Assessment Cervical / Trunk Assessment: Other exceptions Cervical / Trunk Exceptions: back precautions   Communication Communication Communication: No difficulties   Cognition Arousal/Alertness: Awake/alert Behavior During Therapy: Impulsive Overall Cognitive Status: Within Functional Limits for tasks assessed       Memory: Decreased recall of precautions             General Comments       Exercises       Shoulder Instructions      Home Living Family/patient expects to be discharged to:: Private residence Living Arrangements: Spouse/significant other (spouse is disabled) Available Help at Discharge: Family;Available PRN/intermittently (daughter ) Type of Home: House Home Access: Stairs to enter Entrance Stairs-Number of Steps: 7   Home Layout: Laundry or work area in basement;Able to live on main level with bedroom/bathroom;Two level Alternate Level Stairs-Number of Steps: flight to laundry in basement   Bathroom Shower/Tub: Tub/shower unit;Curtain Shower/tub characteristics: Architectural technologist: Standard         Additional Comments: Pt reports her husband is disabled and unable to provide much assistance. Pt reports her daughter can help her with household needs and  assist as needed at d/c. She has done this for her before.      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    OT Diagnosis: Generalized weakness;Acute pain   OT Problem List: Decreased strength;Decreased activity tolerance;Impaired balance (sitting and/or standing);Decreased safety awareness;Decreased knowledge of use of DME or AE;Decreased knowledge of precautions;Pain   OT Treatment/Interventions: Self-care/ADL training;Therapeutic exercise;DME and/or AE instruction;Energy conservation;Therapeutic activities;Patient/family education;Balance training    OT Goals(Current goals can be found in the care plan section) Acute Rehab OT Goals Patient Stated Goal: go home soon OT Goal Formulation: With patient Time For Goal Achievement: 07/30/14 Potential to Achieve Goals: Good ADL Goals Pt Will Perform Grooming: standing;with supervision (maintaining precautions) Pt Will Transfer to Toilet: with supervision;ambulating;bedside commode Pt Will Perform Tub/Shower Transfer: Tub transfer;with min assist;ambulating;shower seat;rolling walker Additional ADL Goal #1: Pt will verbalize 3/3 precautions as precursor to ADLs  OT Frequency: Min 2X/week   Barriers to D/C: Decreased caregiver support  Pt lives with diabled husband. Daughter comes by twice daily to assist with basic needs. Pt reports mother in law, age 73, can come help upon d/c.       Co-evaluation              End of Session Equipment Utilized During Treatment: Gait belt;Rolling walker Nurse Communication: Mobility status;Precautions  Activity Tolerance: Patient tolerated treatment well (Treatment limited due to absense of TLSO) Patient left: in bed;with call bell/phone within reach;with bed alarm set  Time: 1040-1120 OT Time Calculation (min): 40 min Charges:  OT General Charges $OT Visit: 1 Procedure OT Evaluation $Initial OT  Evaluation Tier I: 1 Procedure OT Treatments $Self Care/Home Management : 23-37 mins G-Codes:    Forest Gleason 07/16/2014, 1:23 PM

## 2014-07-16 NOTE — Progress Notes (Addendum)
Patient oriented before and after the fall; patient was embarrassed and apologetic and was not injured by the fall; notified Dr. Doristine Counter 07/16/14 at 1845; patients vital signs at 1437 on 07/16/14 were BP 178/114, pulse 85, SpO2 88, respiration rate 22/min; Vitals at 1447 BP 162/93, pulse 82, SpO2 91, respiration rate 18; at 1802 BP 150/84, pulse 87, SpO2 97, respiration rate 18. Patient asked [place] not to report the incident to any family member. Daughter aware after visiting patient.

## 2014-07-16 NOTE — Progress Notes (Signed)
  Echocardiogram 2D Echocardiogram has been performed.  [name] 07/16/2014, 12:18 PM

## 2014-07-16 NOTE — Progress Notes (Signed)
Pain stable. No new neurologic symptoms. Still feeling somewhat unstable when walking. Awaiting brace.  Continue efforts at mobilization. Patient likely discharged home tomorrow

## 2014-07-17 MED ORDER — GABAPENTIN 300 MG PO CAPS: 300.0000 mg | ORAL_CAPSULE | Freq: Four times a day (QID) | ORAL | Status: DC | PRN

## 2014-07-17 MED ORDER — OXYCODONE-ACETAMINOPHEN 10-325 MG PO TABS: 1.0000 | ORAL_TABLET | Freq: Four times a day (QID) | ORAL | Status: DC | PRN

## 2014-07-17 NOTE — Progress Notes (Signed)
Opt is being discharged home. Discharge instructions were given to patient and family

## 2014-07-17 NOTE — Progress Notes (Signed)
Pt was unsteady today than yesterday, dr Annette Stable was contacted. No new order given so pt will be discharged home.

## 2014-07-17 NOTE — Progress Notes (Addendum)
Upond rounding, patient condition noted to be worsen today. She appeared unsteady than yesterday. Per patient she didn't want to discuss this decondition with MD r/t "afraid of possible surgery". OT/PT do not rec'd discharge today. Patient RN is aware and will paged MD.   Ave Filter, RN

## 2014-07-17 NOTE — Care Management Note (Signed)
Case Management Note  Patient Details  Name: Grace [name] MRN: [id number] Date of Birth: 25-Apr-1950  Subjective/Objective:                    Action/Plan: Orders received for DME. James with [facility] was notified and will deliver equipment prior to discharge today.  Expected Discharge Date:                  Expected Discharge Plan:  Home/Self Care  In-House Referral:     Discharge planning Services     Post Acute Care Choice:  Durable Medical Equipment Choice offered to:     DME Arranged:  3-N-1, Walker rolling DME Agency:  [place]:    [place]:     Status of Service:  Completed, signed off  Medicare Important Message Given:    Date Medicare IM Given:    Medicare IM give by:    Date Additional Medicare IM Given:    Additional Medicare Important Message give by:     If discussed at [place] of Stay Meetings, dates discussed:    Additional Comments:  Rolm Baptise, RN 07/17/2014, 3:47 PM

## 2014-07-17 NOTE — Progress Notes (Signed)
Physical Therapy Treatment Patient Details Name: Grace [name] MRN: [id number] DOB: November 11, 1950 Today's Date: 07/17/2014    History of Present Illness 64 year old female who suffered a mechanical fall approximately 3 weeks ago. Patient notes that in addition to pain she is also developed difficulty controlling her legs when walking. She denies any true weakness. She is having no sensory symptoms. She has had no bowel or bladder dysfunction. She does have some pain which radiates along her lower costal border bilaterally. Her situation is complicated by multiple medical problems including dilated cardiomyopathy, severe COPD which is been treated with oral steroid-induced for proximally 6 months. Recent presumed community acquired pneumonia, and reactive airway disease. Currently her pain is described as moderate. It worsens with activity. She is mildly short of breath. She has very poor exercise tolerance-does not climb stairs and states that if she did she would have severe difficulty climbing 1 flight. Currently awaiting TLSO and no surgical management was recommended per MD.    PT Comments    Patient seen for mobility progression today. OF NOTE: Patient with fall yesterday. Today patient demonstrating concerning deficits for mobility. Patient acknowledges that she feels her mobility is worse since last evening. Patient extremely ataxic and requires increased assist, at times moderate assist during episodes of LOB to prevent patient from falling. During attempt at stair negotiation, patient was able to ascend stairs with min assist but in coming down the steps patient LEs completely gave way resulting in Maximal assist from therapist with bracing between steps and rail to prevent patient from falling. At this time, very concerned regarding patients inability to mobilize safely with increased weakness and ataxic movements. Notified nursing. Patient also expressed concerns regarding difficulty with mobility  (when asked if she informed the MD this am she states that she was to worried about possible surgery that she didn't think to talk to the doctor about things being worse). Highly recommend considering CIR consult for possible further rehabilitation given new changes in mobility and function.   Follow Up Recommendations  CIR, Supervision - Intermittent;Supervision for mobility/OOB;Other (comment) (given todays deficits may need to consider CIR vs HHPT)     Equipment Recommendations  Rolling walker with 5" wheels    Recommendations for Other Services       Precautions / Restrictions Precautions Precautions: Back;Fall Precaution Booklet Issued: Yes (comment) Precaution Comments: reviewed precautions, Pt able to verbalize 2/3 but requires cues to implement during functional activities Required Braces or Orthoses: Spinal Brace Spinal Brace: Thoracolumbosacral orthotic;Applied in sitting position Restrictions Weight Bearing Restrictions: No    Mobility  Bed Mobility Overal bed mobility: Needs Assistance           Sit to sidelying: Supervision General bed mobility comments: VCs for positioning upon return to bed  Transfers Overall transfer level: Needs assistance Equipment used: Rolling walker (2 wheeled) Transfers: Sit to/from Stand Sit to Stand: Min guard, Min assist         General transfer comment: Pt high fall risk, lost balance upon standing, cues for hand placement on RW  Ambulation/Gait Ambulation/Gait assistance: Min guard;Mod assist Ambulation Distance (Feet): 120 Feet Assistive device: Rolling walker (2 wheeled) Gait Pattern/deviations: Ataxic;Decreased stride length;Trunk flexed;Scissoring;Steppage;Staggering left;Staggering right Gait velocity: decreased   General Gait Details: significant instability during ambulation, fluctuations between min gaurd and moderate physical assist with several LOB and near falls. Patient unable to coordinate gait safely today  for any functional distance. Left LE appears to demonstrates more significant weakness and lack of coordination  during mobility but right side also limited. during turns patient with complete LOB requiring complete max assist to prevent fall when turning to the left. Scissoring LLE during turn.    Stairs Stairs: Yes Stairs assistance: Max assist Stair Management: One rail Right;Step to pattern Number of Stairs: 3 General stair comments: When attempting to come down the stairs, patient LLE gave way and patient and therapist with near fall, reliance on therapist to brace between steps and use of rail to ensure patient safety.  Unsafe to perform further at this time. Concerned regarding increased instability in LEs during this activity. May need to consider alternative method for stair negotiation.  Wheelchair Mobility    Modified Rankin (Stroke Patients Only)       Balance Overall balance assessment: Needs assistance;History of Falls (Pt reports falling out of bed on 07/16/14) Sitting-balance support: Feet supported;Single extremity supported       Standing balance support: Bilateral upper extremity supported;During functional activity (pt leans against counter for support when grooming) Standing balance-Leahy Scale: Poor Standing balance comment: unable to maintain static standing without UE support today                    Cognition Arousal/Alertness: Awake/alert Behavior During Therapy: Impulsive         Memory: Decreased recall of precautions;Decreased short-term memory              Exercises      General Comments        Pertinent Vitals/Pain Pain Assessment: No/denies pain    Home Living                      Prior Function            PT Goals (current goals can now be found in the care plan section) Acute Rehab PT Goals Patient Stated Goal: go home soon PT Goal Formulation: With patient Time For Goal Achievement: 07/30/14 Potential to  Achieve Goals: Good Progress towards PT goals: Progressing toward goals    Frequency  Min 5X/week    PT Plan Other (comment)    Co-evaluation             End of Session Equipment Utilized During Treatment: Gait belt Activity Tolerance: Patient tolerated treatment well Patient left: in bed;with call bell/phone within reach;with bed alarm set     Time: 5364-6803 PT Time Calculation (min) (ACUTE ONLY): 18 min  Charges:  $Gait Training: 8-22 mins                    G CodesDuncan Dull Aug 08, 2014, 12:08 PM Alben Deeds, [place] DPT  [phone]

## 2014-07-17 NOTE — Care Management Utilization Note (Signed)
UR completed.    Lionel December, RN, BSN [phone]

## 2014-07-17 NOTE — Discharge Summary (Signed)
Physician Discharge Summary  Patient ID: IESHIA HATCHER MRN: [id number] DOB/AGE: 17-Jan-1951 64 y.o.  Admit date: 07/15/2014 Discharge date: 07/17/2014  Admission Diagnoses:  Discharge Diagnoses:  Principal Problem:   Thoracic compression fracture Active Problems:   COPD (chronic obstructive pulmonary disease) with chronic bronchitis   Congestive dilated cardiomyopathy   Discharged Condition: fair  Hospital Course: Patient admitted to the hospital for evaluation of T5 and T7 compression fractures with early thoracic myelopathy. Patient's multiple medical problems preclude any thoughts of surgical decompression and/or fusion. Currently patient is mobilizing reasonably well in a brace. She is using a walker for further support. Plan is for discharge home. Follow up in 3 weeks.  Consults:   Significant Diagnostic Studies:   Treatments:   Discharge Exam: Blood pressure 145/69, pulse 92, temperature 97.7 F (36.5 C), temperature source Oral, resp. rate 20, height '5\' 5"'$  (1.651 m), weight 77.021 kg (169 lb 12.8 oz), SpO2 97 %. Awake and alert. Oriented and appropriate. Motor and sensory function intact to direct testing. Chest and abdomen stable.  Disposition: 04-Intermediate Care Facility     Medication List    TAKE these medications        albuterol 108 (90 BASE) MCG/ACT inhaler  Commonly known as:  PROVENTIL HFA;VENTOLIN HFA  Inhale 2 puffs into the lungs every 4 (four) hours as needed for shortness of breath.     ARIPiprazole 5 MG tablet  Commonly known as:  ABILIFY  Take 5 mg by mouth daily.     aspirin 325 MG tablet  Take 1,300 mg by mouth once.     CURCUMAX PRO PO  Take 1 tablet by mouth daily.     DULoxetine 60 MG capsule  Commonly known as:  CYMBALTA  Take 60 mg by mouth daily.     Fish Oil 1200 MG Caps  Take 1 capsule by mouth daily.     Fluticasone-Salmeterol 250-50 MCG/DOSE Aepb  Commonly known as:  ADVAIR  Inhale 1 puff into the lungs 2 (two) times  daily.     gabapentin 300 MG capsule  Commonly known as:  NEURONTIN  Take 1 capsule (300 mg total) by mouth 4 (four) times daily as needed (pain).     GREEN TEA EXTRACT PO  Take 1 each by mouth daily.     ibuprofen 200 MG tablet  Commonly known as:  ADVIL,MOTRIN  Take 600 mg by mouth every 6 (six) hours as needed (pain).     LORazepam 0.5 MG tablet  Commonly known as:  ATIVAN  Take 0.5 mg by mouth every 8 (eight) hours as needed for anxiety.     methocarbamol 500 MG tablet  Commonly known as:  ROBAXIN  Take 1 tablet (500 mg total) by mouth every 6 (six) hours as needed for muscle spasms.     metoprolol 100 MG tablet  Commonly known as:  LOPRESSOR  Take 1 tablet (100 mg total) by mouth daily.     modafinil 200 MG tablet  Commonly known as:  PROVIGIL  Take 200 mg by mouth daily as needed (work).     ondansetron 4 MG disintegrating tablet  Commonly known as:  ZOFRAN-ODT  Take 4 mg by mouth every 8 (eight) hours as needed for nausea or vomiting.     oxyCODONE-acetaminophen 5-325 MG per tablet  Commonly known as:  PERCOCET/ROXICET  Take 2 tablets by mouth every 6 (six) hours as needed for moderate pain.     oxyCODONE-acetaminophen 10-325 MG per tablet  Commonly  known as:  PERCOCET  Take 1 tablet by mouth every 6 (six) hours as needed for pain.     varenicline 1 MG tablet  Commonly known as:  CHANTIX  Take 1 mg by mouth 2 (two) times daily.           Follow-up Information    Follow up with Bailen Geffre A, MD. Schedule an appointment as soon as possible for a visit in 3 weeks.   Specialty:  Neurosurgery   Contact information:   [address] [place] [address] [phone]       Signed: Charlie Pitter 07/17/2014, 10:56 AM

## 2014-07-17 NOTE — Discharge Instructions (Signed)
Back, Compression Fracture °A compression fracture happens when a force is put upon the length of your spine. Slipping and falling on your bottom are examples of such a force. When this happens, sometimes the force is great enough to compress the building blocks (vertebral bodies) of your spine. Although this causes a lot of pain, this can usually be treated at home, unless your caregiver feels hospitalization is needed for pain control. °Your backbone (spinal column) is made up of 24 main vertebral bodies in addition to the sacrum and coccyx (see illustration). These are held together by tough fibrous tissues (ligaments) and by support of your muscles. Nerve roots pass through the openings between the vertebrae. A sudden wrenching move, injury, or a fall may cause a compression fracture of one of the vertebral bodies. This may result in back pain or spread of pain into the belly (abdomen), the buttocks, and down the leg into the foot. Pain may also be created by muscle spasm alone. °Large studies have been undertaken to determine the best possible course of action to help your back following injury and also to prevent future problems. The recommendations are as follows. °FOLLOWING A COMPRESSION FRACTURE: °Do the following only if advised by your caregiver.  °· If a back brace has been suggested or provided, wear it as directed. °· Do not stop wearing the back brace unless instructed by your caregiver. °· When allowed to return to regular activities, avoid a sedentary lifestyle. Actively exercise. Sporadic weekend binges of tennis, racquetball, or waterskiing may actually aggravate or create problems, especially if you are not in condition for that activity. °· Avoid sports requiring sudden body movements until you are in condition for them. Swimming and walking are safer activities. °· Maintain good posture. °· Avoid obesity. °· If not already done, you should have a DEXA scan. Based on the results, be treated for  osteoporosis. °FOLLOWING ACUTE (SUDDEN) INJURY: °· Only take over-the-counter or prescription medicines for pain, discomfort, or fever as directed by your caregiver. °· Use bed rest for only the most extreme acute episode. Prolonged bed rest may aggravate your condition. Ice used for acute conditions is effective. Use a large plastic bag filled with ice. Wrap it in a towel. This also provides excellent pain relief. This may be continuous. Or use it for 30 minutes every 2 hours during acute phase, then as needed. Heat for 30 minutes prior to activities is helpful. °· As soon as the acute phase (the time when your back is too painful for you to do normal activities) is over, it is important to resume normal activities and work hardening programs. Back injuries can cause potentially marked changes in lifestyle. So it is important to attack these problems aggressively. °· See your caregiver for continued problems. He or she can help or refer you for appropriate exercises, physical therapy, and work hardening if needed. °· If you are given narcotic medications for your condition, for the next 24 hours do not: °¨ Drive. °¨ Operate machinery or power tools. °¨ Sign legal documents. °· Do not drink alcohol, or take sleeping pills or other medications that may interfere with treatment. °If your caregiver has given you a follow-up appointment, it is very important to keep that appointment. Not keeping the appointment could result in a chronic or permanent injury, pain, and disability. If there is any problem keeping the appointment, you must call back to this facility for assistance.  °SEEK IMMEDIATE MEDICAL CARE IF: °· You develop numbness,   tingling, weakness, or problems with the use of your arms or legs. °· You develop severe back pain not relieved with medications. °· You have changes in bowel or bladder control. °· You have increasing pain in any areas of the body. °Document Released: 01/31/2005 Document Revised:  06/17/2013 Document Reviewed: 09/05/2007 °ExitCare® Patient Information ©2015 [employer]. This information is not intended to replace advice given to you by your health care provider. Make sure you discuss any questions you have with your health care provider. ° °

## 2014-07-17 NOTE — Progress Notes (Signed)
Rehab Admissions Coordinator Note:  Patient was screened by Retta Diones for appropriateness for an Inpatient Acute Rehab Consult.  Noted PT recommending CIR consult.  At this time, we are recommending Inpatient Rehab consult.  Retta Diones 07/17/2014, 12:54 PM  I can be reached at [phone].

## 2014-07-17 NOTE — Progress Notes (Signed)
Occupational Therapy Treatment Patient Details Name: Grace [name] MRN: [id number] DOB: [birth year] Today's Date: 07/17/2014    History of present illness 64 year old female who suffered a mechanical fall approximately 3 weeks ago. Patient notes that in addition to pain she is also developed difficulty controlling her legs when walking. She denies any true weakness. She is having no sensory symptoms. She has had no bowel or bladder dysfunction. She does have some pain which radiates along her lower costal border bilaterally. Her situation is complicated by multiple medical problems including dilated cardiomyopathy, severe COPD which is been treated with oral steroid-induced for proximally 6 months. Recent presumed community acquired pneumonia, and reactive airway disease. Currently her pain is described as moderate. It worsens with activity. She is mildly short of breath. She has very poor exercise tolerance-does not climb stairs and states that if she did she would have severe difficulty climbing 1 flight. Currently awaiting TLSO and no surgical management was recommended per MD.   OT comments  Pt sitting up in bed upon OT arrival, not wearing TLSO.  Pt reports talking with MD and being told TLSO can be taken off when sitting still. Pt bending forward in bed coloring, not maintaining back precautions upon arrival. Pt able to recall 2/3 back precautions, but unable to recall or maintain precautions during functional activity.  RN reports Pt fell 07/16/14 going to bathroom. Pt reports "sliding out of bed" when reaching for item on the floor, however does recall needing to use bedside commode last night due to being unable to walk into bathroom.. Pt high fall risk, very unsteady upon standing. Pt required Min A to ambulate to bathroom sink due to frequent loss of balance, and leaned against sink during task for support. Education provided on sitting to complete ADLs due to increased fall risk. Pt required  constant cues to maintain precautions during ADL task. Pt states husband is disabled and can not assist with care at home. Daughter is local, and can be called if A is needed. Pt unable to verbalize clear plan of self care upon return home. OT does not advise Pt to return home at this time due to implusivness, increased fall risk and inability to maintain precautions.   Follow Up Recommendations  CIR    Equipment Recommendations  3 in 1 bedside comode    Recommendations for Other Services      Precautions / Restrictions Precautions Precautions: Back;Fall Precaution Booklet Issued: Yes (comment) Precaution Comments: reviewed precautions, Pt able to verbalize 2/3 but requires cues to implement during functional activities Required Braces or Orthoses: Spinal Brace Spinal Brace: Thoracolumbosacral orthotic;Applied in sitting position Restrictions Weight Bearing Restrictions: No       Mobility Bed Mobility Overal bed mobility: Needs Assistance           Sit to sidelying: Supervision General bed mobility comments: VCs for positioning upon return to bed  Transfers Overall transfer level: Needs assistance Equipment used: Rolling walker (2 wheeled) Transfers: Sit to/from Stand Sit to Stand: Min guard         General transfer comment: Pt high fall risk, lost balance upon standing, cues for hand placement on RW    Balance Overall balance assessment: Needs assistance;History of Falls (Pt reports falling out of bed on 07/16/14) Sitting-balance support: Feet supported;Single extremity supported       Standing balance support: Bilateral upper extremity supported;During functional activity (pt leans against counter for support when grooming) Standing balance-Leahy Scale: Poor Standing balance comment: unable to  maintain static standing without UE support today                   ADL Overall ADL's : Needs assistance/impaired     Grooming: Oral care;Wash/dry  hands;Standing;Minimal assistance;Cueing for safety (cuing for precautions) Grooming Details (indicate cue type and reason): Pt educated on sitting to complete grooming tasks at home due to balance deficits         Upper Body Dressing : Sitting;Moderate assistance (don TLSO) Upper Body Dressing Details (indicate cue type and reason): mod A to adjust TLSO straps and cuing to maintain precautions                 Functional mobility during ADLs: Moderate assistance;Rolling walker General ADL Comments: Pt requires constand cuing to maintain back precautions; is high fall risk and can not maintain balance using RW. Education provided on maintaining precautions during ADLs at home. Pt states spouse can A with tightening brace.       Vision                     Perception     Praxis      Cognition   Behavior During Therapy: Impulsive         Memory: Decreased recall of precautions;Decreased short-term memory               Extremity/Trunk Assessment               Exercises     Shoulder Instructions       General Comments      Pertinent Vitals/ Pain       Pain Assessment: No/denies pain  Home Living                                          Prior Functioning/Environment              Frequency Min 2X/week     Progress Toward Goals  OT Goals(current goals can now be found in the care plan section)  Progress towards OT goals: Progressing toward goals  Acute Rehab OT Goals Patient Stated Goal: go home soon OT Goal Formulation: With patient Time For Goal Achievement: 07/30/14 ADL Goals Pt Will Perform Grooming: standing;with supervision Pt Will Transfer to Toilet: with supervision;ambulating;bedside commode Pt Will Perform Tub/Shower Transfer: Tub transfer;with min assist;ambulating;shower seat;rolling walker Additional ADL Goal #1: Pt will verbalize 3/3 precautions as precursor to ADLs  Plan      Co-evaluation                  End of Session Equipment Utilized During Treatment: Gait belt;Rolling walker;Back brace   Activity Tolerance Patient tolerated treatment well   Patient Left  (with PT)   Nurse Communication Precautions;Mobility status        Time: 1100-1125 OT Time Calculation (min): 25 min  Charges: OT General Charges $OT Visit: 1 Procedure OT Treatments $Self Care/Home Management : 23-37 mins  Forest Gleason 07/17/2014, 2:40 PM

## 2014-07-19 ENCOUNTER — Ambulatory Visit: Payer: 59

## 2014-08-22 ENCOUNTER — Emergency Department: Payer: 59

## 2014-08-22 ENCOUNTER — Inpatient Hospital Stay
Admission: EM | Admit: 2014-08-22 | Discharge: 2014-08-24 | DRG: 190 | Disposition: A | Discharge status: Home / Self Care | Payer: 59 | Attending: Internal Medicine | Admitting: Internal Medicine

## 2014-08-22 ENCOUNTER — Encounter: Payer: Self-pay | Admitting: Emergency Medicine

## 2014-08-22 DIAGNOSIS — G8929 Other chronic pain: Secondary | ICD-10-CM | POA: Diagnosis present

## 2014-08-22 DIAGNOSIS — I5023 Acute on chronic systolic (congestive) heart failure: Secondary | ICD-10-CM | POA: Diagnosis not present

## 2014-08-22 DIAGNOSIS — Z79891 Long term (current) use of opiate analgesic: Secondary | ICD-10-CM

## 2014-08-22 DIAGNOSIS — Z87891 Personal history of nicotine dependence: Secondary | ICD-10-CM | POA: Diagnosis not present

## 2014-08-22 DIAGNOSIS — I10 Essential (primary) hypertension: Secondary | ICD-10-CM | POA: Diagnosis present

## 2014-08-22 DIAGNOSIS — Z7982 Long term (current) use of aspirin: Secondary | ICD-10-CM

## 2014-08-22 DIAGNOSIS — J441 Chronic obstructive pulmonary disease with (acute) exacerbation: Principal | ICD-10-CM | POA: Diagnosis present

## 2014-08-22 DIAGNOSIS — E669 Obesity, unspecified: Secondary | ICD-10-CM | POA: Diagnosis present

## 2014-08-22 DIAGNOSIS — M4854XA Collapsed vertebra, not elsewhere classified, thoracic region, initial encounter for fracture: Secondary | ICD-10-CM | POA: Diagnosis present

## 2014-08-22 DIAGNOSIS — R0602 Shortness of breath: Secondary | ICD-10-CM | POA: Diagnosis not present

## 2014-08-22 DIAGNOSIS — I509 Heart failure, unspecified: Secondary | ICD-10-CM

## 2014-08-22 DIAGNOSIS — Z6829 Body mass index (BMI) 29.0-29.9, adult: Secondary | ICD-10-CM | POA: Diagnosis not present

## 2014-08-22 DIAGNOSIS — F329 Major depressive disorder, single episode, unspecified: Secondary | ICD-10-CM | POA: Diagnosis present

## 2014-08-22 DIAGNOSIS — K279 Peptic ulcer, site unspecified, unspecified as acute or chronic, without hemorrhage or perforation: Secondary | ICD-10-CM | POA: Diagnosis present

## 2014-08-22 DIAGNOSIS — E785 Hyperlipidemia, unspecified: Secondary | ICD-10-CM | POA: Diagnosis present

## 2014-08-22 DIAGNOSIS — I252 Old myocardial infarction: Secondary | ICD-10-CM

## 2014-08-22 DIAGNOSIS — J45909 Unspecified asthma, uncomplicated: Secondary | ICD-10-CM | POA: Diagnosis present

## 2014-08-22 DIAGNOSIS — Z91041 Radiographic dye allergy status: Secondary | ICD-10-CM | POA: Diagnosis not present

## 2014-08-22 LAB — CBC
HCT: 37.8 % (ref 35.0–47.0)
Hemoglobin: 12.7 g/dL (ref 12.0–16.0)
MCH: 31.4 pg (ref 26.0–34.0)
MCHC: 33.5 g/dL (ref 32.0–36.0)
MCV: 93.7 fL (ref 80.0–100.0)
PLATELETS: 225 10*3/uL (ref 150–440)
RBC: 4.04 MIL/uL (ref 3.80–5.20)
RDW: 14 % (ref 11.5–14.5)
WBC: 8.4 10*3/uL (ref 3.6–11.0)

## 2014-08-22 LAB — TROPONIN I: Troponin I: <0.03 ng/mL (ref <0.031)

## 2014-08-22 LAB — COMPREHENSIVE METABOLIC PANEL
ALBUMIN: 3.7 g/dL (ref 3.5–5.0)
ALK PHOS: 85 U/L (ref 38–126)
ALT: 13 U/L — ABNORMAL LOW (ref 14–54)
ANION GAP: 8 (ref 5–15)
AST: 18 U/L (ref 15–41)
BUN: 12 mg/dL (ref 6–20)
CO2: 29 mmol/L (ref 22–32)
Calcium: 9.3 mg/dL (ref 8.9–10.3)
Chloride: 108 mmol/L (ref 101–111)
Creatinine, Ser: 0.63 mg/dL (ref 0.44–1.00)
GFR calc Af Amer: >60 mL/min (ref >60)
GFR calc non Af Amer: >60 mL/min (ref >60)
Glucose, Bld: 108 mg/dL — ABNORMAL HIGH (ref 65–99)
POTASSIUM: 4 mmol/L (ref 3.5–5.1)
SODIUM: 145 mmol/L (ref 135–145)
TOTAL PROTEIN: 6.9 g/dL (ref 6.5–8.1)
Total Bilirubin: 0.5 mg/dL (ref 0.3–1.2)

## 2014-08-22 LAB — BRAIN NATRIURETIC PEPTIDE: B NATRIURETIC PEPTIDE 5: 38 pg/mL (ref 0.0–100.0)

## 2014-08-22 MED ORDER — METHYLPREDNISOLONE SODIUM SUCC 125 MG IJ SOLR
125.0000 mg | Freq: Once | INTRAMUSCULAR | Status: AC
  Administered 2014-08-22: 125 mg via INTRAVENOUS
  Filled 2014-08-22: qty 2

## 2014-08-22 MED ORDER — ALBUTEROL SULFATE (2.5 MG/3ML) 0.083% IN NEBU
INHALATION_SOLUTION | RESPIRATORY_TRACT | Status: AC
  Administered 2014-08-22: 2.5 mg via RESPIRATORY_TRACT
  Filled 2014-08-22: qty 3

## 2014-08-22 MED ORDER — ALBUTEROL SULFATE (2.5 MG/3ML) 0.083% IN NEBU: 2.5000 mg | INHALATION_SOLUTION | RESPIRATORY_TRACT | Status: DC | PRN

## 2014-08-22 MED ORDER — OXYCODONE-ACETAMINOPHEN 5-325 MG PO TABS
2.0000 | ORAL_TABLET | Freq: Four times a day (QID) | ORAL | Status: DC | PRN
Start: 2014-08-22 — End: 2014-08-23
  Administered 2014-08-22 – 2014-08-23 (×4): 2 via ORAL
  Filled 2014-08-22 (×3): qty 2

## 2014-08-22 MED ORDER — PANTOPRAZOLE SODIUM 40 MG PO TBEC
40.0000 mg | DELAYED_RELEASE_TABLET | Freq: Every day | ORAL | Status: DC
  Administered 2014-08-23 – 2014-08-24 (×2): 40 mg via ORAL
  Filled 2014-08-22 (×2): qty 1

## 2014-08-22 MED ORDER — METHOCARBAMOL 500 MG PO TABS
500.0000 mg | ORAL_TABLET | Freq: Four times a day (QID) | ORAL | Status: DC | PRN
  Filled 2014-08-22: qty 1

## 2014-08-22 MED ORDER — IPRATROPIUM-ALBUTEROL 0.5-2.5 (3) MG/3ML IN SOLN
3.0000 mL | Freq: Once | RESPIRATORY_TRACT | Status: AC
  Administered 2014-08-22: 3 mL via RESPIRATORY_TRACT
  Filled 2014-08-22: qty 3

## 2014-08-22 MED ORDER — ASPIRIN 325 MG PO TABS
325.0000 mg | ORAL_TABLET | Freq: Every day | ORAL | Status: DC
  Administered 2014-08-22: 325 mg via ORAL
  Filled 2014-08-22 (×2): qty 1

## 2014-08-22 MED ORDER — ALBUTEROL SULFATE (2.5 MG/3ML) 0.083% IN NEBU
2.5000 mg | INHALATION_SOLUTION | Freq: Four times a day (QID) | RESPIRATORY_TRACT | Status: DC
  Administered 2014-08-22 – 2014-08-24 (×6): 2.5 mg via RESPIRATORY_TRACT
  Filled 2014-08-22 (×5): qty 3

## 2014-08-22 MED ORDER — DULOXETINE HCL 60 MG PO CPEP
60.0000 mg | ORAL_CAPSULE | Freq: Every day | ORAL | Status: DC
  Administered 2014-08-23 – 2014-08-24 (×2): 60 mg via ORAL
  Filled 2014-08-22 (×2): qty 1

## 2014-08-22 MED ORDER — GABAPENTIN 300 MG PO CAPS
300.0000 mg | ORAL_CAPSULE | Freq: Four times a day (QID) | ORAL | Status: DC | PRN
  Administered 2014-08-23: 300 mg via ORAL
  Filled 2014-08-22: qty 1

## 2014-08-22 MED ORDER — OXYCODONE-ACETAMINOPHEN 5-325 MG PO TABS
ORAL_TABLET | ORAL | Status: AC
  Administered 2014-08-22: 2 via ORAL
  Filled 2014-08-22: qty 2

## 2014-08-22 MED ORDER — MOMETASONE FURO-FORMOTEROL FUM 100-5 MCG/ACT IN AERO
2.0000 | INHALATION_SPRAY | Freq: Two times a day (BID) | RESPIRATORY_TRACT | Status: DC
  Administered 2014-08-22 – 2014-08-24 (×4): 2 via RESPIRATORY_TRACT
  Filled 2014-08-22: qty 8.8

## 2014-08-22 MED ORDER — ASPIRIN 81 MG PO CHEW
CHEWABLE_TABLET | ORAL | Status: AC
  Administered 2014-08-22: 325 mg
  Filled 2014-08-22: qty 4

## 2014-08-22 MED ORDER — ONDANSETRON 4 MG PO TBDP
4.0000 mg | ORAL_TABLET | Freq: Three times a day (TID) | ORAL | Status: DC | PRN
  Filled 2014-08-22: qty 1

## 2014-08-22 MED ORDER — METOPROLOL TARTRATE 100 MG PO TABS
100.0000 mg | ORAL_TABLET | Freq: Every day | ORAL | Status: DC
  Administered 2014-08-23 – 2014-08-24 (×2): 100 mg via ORAL
  Filled 2014-08-22 (×2): qty 1

## 2014-08-22 MED ORDER — MODAFINIL 200 MG PO TABS: 200.0000 mg | ORAL_TABLET | Freq: Every day | ORAL | Status: DC | PRN

## 2014-08-22 MED ORDER — OMEGA-3-ACID ETHYL ESTERS 1 G PO CAPS
1.0000 g | ORAL_CAPSULE | Freq: Every day | ORAL | Status: DC
  Administered 2014-08-22 – 2014-08-24 (×3): 1 g via ORAL
  Filled 2014-08-22 (×3): qty 1

## 2014-08-22 MED ORDER — MORPHINE SULFATE 4 MG/ML IJ SOLN
4.0000 mg | Freq: Once | INTRAMUSCULAR | Status: AC
  Administered 2014-08-22: 4 mg via INTRAVENOUS
  Filled 2014-08-22: qty 1

## 2014-08-22 MED ORDER — FUROSEMIDE 10 MG/ML IJ SOLN
INTRAMUSCULAR | Status: AC
  Administered 2014-08-22: 40 mg via INTRAVENOUS
  Filled 2014-08-22: qty 4

## 2014-08-22 MED ORDER — ENOXAPARIN SODIUM 40 MG/0.4ML ~~LOC~~ SOLN
40.0000 mg | SUBCUTANEOUS | Status: DC
  Administered 2014-08-23: 40 mg via SUBCUTANEOUS
  Filled 2014-08-22: qty 0.4

## 2014-08-22 MED ORDER — LORAZEPAM 0.5 MG PO TABS
0.5000 mg | ORAL_TABLET | Freq: Three times a day (TID) | ORAL | Status: DC | PRN
Start: 2014-08-22 — End: 2014-08-24
  Administered 2014-08-23: 0.5 mg via ORAL
  Filled 2014-08-22: qty 1

## 2014-08-22 MED ORDER — IBUPROFEN 600 MG PO TABS: 600.0000 mg | ORAL_TABLET | Freq: Four times a day (QID) | ORAL | Status: DC | PRN

## 2014-08-22 MED ORDER — ARIPIPRAZOLE 5 MG PO TABS
5.0000 mg | ORAL_TABLET | Freq: Every day | ORAL | Status: DC
  Administered 2014-08-23 – 2014-08-24 (×2): 5 mg via ORAL
  Filled 2014-08-22 (×2): qty 1

## 2014-08-22 MED ORDER — FUROSEMIDE 10 MG/ML IJ SOLN
40.0000 mg | Freq: Every day | INTRAMUSCULAR | Status: DC
  Administered 2014-08-22 – 2014-08-23 (×2): 40 mg via INTRAVENOUS
  Filled 2014-08-22: qty 4

## 2014-08-22 MED ORDER — VARENICLINE TARTRATE 1 MG PO TABS
1.0000 mg | ORAL_TABLET | Freq: Two times a day (BID) | ORAL | Status: DC
  Administered 2014-08-22 – 2014-08-24 (×4): 1 mg via ORAL
  Filled 2014-08-22 (×7): qty 1

## 2014-08-22 NOTE — H&P (Signed)
[facility] [place] NAME: Grace [name]    MR#:  [id number]  DATE OF BIRTH:  [birth year]  DATE OF ADMISSION:  08/22/2014  PRIMARY CARE PHYSICIAN: [name], MD   REQUESTING/REFERRING PHYSICIAN:Kevin Paduchowski CHIEF COMPLAINT:   Chief Complaint  Patient presents with  . Shortness of Breath    HISTORY OF PRESENT ILLNESS:  Grace [name]  is a 64 y.o. female with a known history of chronic back pain, COPD, depression comes in secondary to low back pain, also shortness of breath for 3-4 days. Patient recently was hospitalized for COPD exacerbation and back pain in May. Patient seen in the emergency room end of June secondary to back pain found to have T4 compression fractures and was referred to [facility] patient did not have any back surgery at [facility] and was told to wear a back brace. Patient is not able to walk secondary to severe back pain and weakness in the legs. Patient is mostly bedbound due to back pain. And the patient denies any numbness, her weakness actually is better today. Patient however is not ambulating for the last 2-3 weeks. Secondary to back pain. Today she says she has shortness of breath going on for 3 days and also abdominal swelling. Saw Dr. Esmond Plants when she was at [facility], EF 35  To 40%..  Denies any  leg swelling. O2 saturations are 92% on 2 L here. PAST MEDICAL HISTORY:   Past Medical History  Diagnosis Date  . Cardiomyopathy   . Hypertension   . Hyperlipidemia   . Asthma   . COPD (chronic obstructive pulmonary disease)   . PVC's (premature ventricular contractions)     PAST SURGICAL HISTOIRY:   Past Surgical History  Procedure Laterality Date  . Cardiac catheterization    . Cesarean section    . Appendectomy    . Cataract extraction      right eye     SOCIAL HISTORY:   History  Substance Use Topics  . Smoking status: Former Smoker -- 0.50 packs/day for 50 years    Types:  Cigarettes  . Smokeless tobacco: Not on file  . Alcohol Use: No    FAMILY HISTORY:   Family History  Problem Relation Age of Onset  . Heart attack Father 33    MI x 3     DRUG ALLERGIES:   Allergies  Allergen Reactions  . Iodine Anaphylaxis    IVP dye    REVIEW OF SYSTEMS:  CONSTITUTIONAL: No fever, fatigue or weakness.  EYES: No blurred or double vision.  EARS, NOSE, AND THROAT: No tinnitus or ear pain.  RESPIRATORY: Cough, shortness of breath, ascites.  CARDIOVASCULAR: No chest pain, orthopnea, edema.  GASTROINTESTINAL: No nausea, vomiting, diarrhea or abdominal pain.  GENITOURINARY: No dysuria, hematuria.  ENDOCRINE: No polyuria, nocturia,  HEMATOLOGY: No anemia, easy bruising or bleeding SKIN: No rash or lesion. MUSCULOSKELETAL: No joint pain or arthritis.   NEUROLOGIC: No tingling, numbness, weakness.  PSYCHIATRY: No anxiety or depression.   MEDICATIONS AT HOME:   Prior to Admission medications   Medication Sig Start Date End Date Taking? Authorizing Provider  albuterol (PROVENTIL HFA;VENTOLIN HFA) 108 (90 BASE) MCG/ACT inhaler Inhale 2 puffs into the lungs every 4 (four) hours as needed for shortness of breath.  12/23/13  Yes Historical Provider, MD  ARIPiprazole (ABILIFY) 5 MG tablet Take 5 mg by mouth daily.   Yes Historical Provider, MD  aspirin 325 MG  tablet Take 325 mg by mouth daily.    Yes Historical Provider, MD  DULoxetine (CYMBALTA) 60 MG capsule Take 60 mg by mouth daily.   Yes Historical Provider, MD  Fluticasone-Salmeterol (ADVAIR) 250-50 MCG/DOSE AEPB Inhale 1 puff into the lungs 2 (two) times daily. 06/20/14 06/20/15 Yes Historical Provider, MD  gabapentin (NEURONTIN) 300 MG capsule Take 1 capsule (300 mg total) by mouth 4 (four) times daily as needed (pain). Patient taking differently: Take 300 mg by mouth 3 (three) times daily as needed (pain).  07/17/14  Yes Earnie Larsson, MD  Green Tea, Camillia sinensis, (GREEN TEA EXTRACT PO) Take 1 each by mouth daily.    Yes Historical Provider, MD  ibuprofen (ADVIL,MOTRIN) 200 MG tablet Take 600 mg by mouth every 6 (six) hours as needed (pain).   Yes Historical Provider, MD  LORazepam (ATIVAN) 0.5 MG tablet Take 0.5 mg by mouth every 8 (eight) hours as needed for anxiety.   Yes Historical Provider, MD  methocarbamol (ROBAXIN) 500 MG tablet Take 1 tablet (500 mg total) by mouth every 6 (six) hours as needed for muscle spasms. 07/02/14  Yes Epifanio Lesches, MD  metoprolol (LOPRESSOR) 100 MG tablet Take 1 tablet (100 mg total) by mouth daily. Patient taking differently: Take 100-200 mg by mouth 2 (two) times daily as needed (heartrate). Take '100mg'$  in the morning and may take an extra dose if HR is elevated 07/02/14  Yes Epifanio Lesches, MD  Misc Natural Products (CURCUMAX PRO PO) Take 1 tablet by mouth daily.   Yes Historical Provider, MD  modafinil (PROVIGIL) 200 MG tablet Take 200 mg by mouth daily as needed (work).    Yes Historical Provider, MD  Omega-3 Fatty Acids (FISH OIL) 1200 MG CAPS Take 1 capsule by mouth daily.    Yes Historical Provider, MD  oxyCODONE-acetaminophen (PERCOCET) 10-325 MG per tablet Take 1 tablet by mouth every 6 (six) hours as needed for pain. 07/17/14  Yes Earnie Larsson, MD  pantoprazole (PROTONIX) 40 MG tablet Take 40 mg by mouth daily.   Yes Historical Provider, MD  varenicline (CHANTIX) 1 MG tablet Take 1 mg by mouth 2 (two) times daily.   Yes Historical Provider, MD  ondansetron (ZOFRAN-ODT) 4 MG disintegrating tablet Take 4 mg by mouth every 8 (eight) hours as needed for nausea or vomiting.    Historical Provider, MD  oxyCODONE-acetaminophen (PERCOCET/ROXICET) 5-325 MG per tablet Take 2 tablets by mouth every 6 (six) hours as needed for moderate pain. Patient not taking: Reported on 08/22/2014 07/02/14   Epifanio Lesches, MD      VITAL SIGNS:  Blood pressure 145/78, pulse 90, temperature 97.6 F (36.4 C), temperature source Oral, resp. rate 20, height '5\' 5"'$  (1.651 m), weight  80.559 kg (177 lb 9.6 oz), SpO2 92 %.  PHYSICAL EXAMINATION:  GENERAL:  64 y.o.-year-old patient lying in the bed with no acute distress.  EYES: Pupils equal, round, reactive to light and accommodation. No scleral icterus. Extraocular muscles intact.  HEENT: Head atraumatic, normocephalic. Oropharynx and nasopharynx clear.  NECK:  Supple, no jugular venous distention. No thyroid enlargement, no tenderness.  LUNGS: Coarse breath sounds bilaterally, no wheezing. CARDIOVASCULAR: S1, S2 normal. No murmurs, rubs, or gallops.  ABDOMEN: Slightly distended, soft bowel sounds present.  EXTREMITIES: No pedal edema, cyanosis, or clubbing.  NEUROLOGIC: Cranial nerves II through XII are intact. Muscle strength 5/5 in all extremities. Sensation intact. Gait not checked.  PSYCHIATRIC: The patient is alert and oriented x 3.  SKIN: No obvious rash,  lesion, or ulcer.   LABORATORY PANEL:   CBC  Recent Labs Lab 08/22/14 1329  WBC 8.4  HGB 12.7  HCT 37.8  PLT 225   ------------------------------------------------------------------------------------------------------------------  Chemistries   Recent Labs Lab 08/22/14 1329  NA 145  K 4.0  CL 108  CO2 29  GLUCOSE 108*  BUN 12  CREATININE 0.63  CALCIUM 9.3  AST 18  ALT 13*  ALKPHOS 85  BILITOT 0.5   ------------------------------------------------------------------------------------------------------------------  Cardiac Enzymes  Recent Labs Lab 08/22/14 1329  TROPONINI <0.03   ------------------------------------------------------------------------------------------------------------------  RADIOLOGY:  Dg Chest Portable 1 View  08/22/2014   CLINICAL DATA:  Short of breath for 5 days.  Initial encounter.  EXAM: PORTABLE CHEST - 1 VIEW  COMPARISON:  06/30/2014.  04/05/2013.  FINDINGS: The exam is under penetrated. There is opacity at the LEFT lung base which given the underpenetration is due to overlapping soft tissue. The chest  radiograph appears similar to the prior exam from 06/30/2014.  Monitoring leads project over the chest. Subsegmental atelectasis is present on the RIGHT radiating from the infrahilar region. Aortic arch atherosclerosis. Cardiopericardial silhouette within normal limits for projection.  IMPRESSION: 1. Subsegmental atelectasis in the RIGHT lung. 2. Exam technically degraded by underpenetration. No acute cardiopulmonary disease.   Electronically Signed   By: Dereck Ligas M.D.   On: 08/22/2014 14:28    EKG:   Orders placed or performed during the hospital encounter of 06/30/14  . EKG 12-Lead  . EKG 12-Lead  . EKG    IMPRESSION AND PLAN:   *1. Possibly acute on chronic systolic heart failure given the BNP is normal. Patient will be started on IV Lasix, continue oxygen. Continue low-sodium diet, daily weights.2. 2.Chronic back pain with recent T4-T6 vertebral compression fractures: Patient will get pain medications as per home meds, physical therapy evaluation. #3; COPD status really follows up with Dr. Raul Del, continue home medications. Depression: Continue Cymbalta, Abilify. Peptic ulcer disease: Continue her medications. Hypertension: Controlled, continue metoprolol 100 MG daily     All the records are reviewed and case discussed with ED provider. Management plans discussed with the patient, family and they are in agreement.  CODE STATUS: Full  TOTAL TIME TAKING CARE OF THIS PATIENT: 55 minutes.    Epifanio Lesches M.D on 08/22/2014 at 5:48 PM  Between 7am to 6pm - Pager - [phone]  After 6pm go to [email or web address] - password [email or web address]  [facility]  Office  [phone]  CC: Primary care physician; [name], MD

## 2014-08-22 NOTE — ED Provider Notes (Signed)
[facility] Emergency Department Provider Note  Time seen: 1:52 PM  I have reviewed the triage vital signs and the nursing notes.   HISTORY  Chief Complaint Shortness of Breath    HPI Grace [name] is a 64 y.o. female with a past medical history of cardiomyopathy, hypertension, hyperlipidemia, COPD who presents to the emergency department with difficulty breathing 4 days. According to the patient she has a long history of back pain, spinal stenosis, and is now have bilateral weakness in her lower extremities for the last several months. She states she is mostly bedbound, she can ambulate somewhat with a walker but very very limited. Patient states she was trying to get up out of bed today and she fell, and was unable to get herself off the ground. Denies any pain from the fall any more so than her chronic back pain. Patient states since she couldn't get up she called EMS, and she states she was going to call EMS for regardless because of the difficulty breathing 4 days. Patient is a history of COPD, does not wear oxygen at home. Patient denies any fever she does state a mild cough.     Past Medical History  Diagnosis Date  . Cardiomyopathy   . Hypertension   . Hyperlipidemia   . Asthma   . COPD (chronic obstructive pulmonary disease)   . PVC's (premature ventricular contractions)     Patient Active Problem List   Diagnosis Date Noted  . Thoracic compression fracture 07/15/2014  . Sepsis 06/30/2014  . CAP (community acquired pneumonia) 06/30/2014  . GERD (gastroesophageal reflux disease) 06/30/2014  . Depression 06/30/2014  . PVC's (premature ventricular contractions) 05/08/2014  . SOB (shortness of breath) 05/08/2014  . COPD (chronic obstructive pulmonary disease) with chronic bronchitis 05/08/2014  . Obesity 05/08/2014  . Tachycardia 05/08/2014  . 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 Cardiac catheterization    . Cesarean section    . Appendectomy    . Cataract extraction      right eye     Current Outpatient Rx  Name  Route  Sig  Dispense  Refill  . albuterol (PROVENTIL HFA;VENTOLIN HFA) 108 (90 BASE) MCG/ACT inhaler   Inhalation   Inhale 2 puffs into the lungs every 4 (four) hours as needed for shortness of breath.          . ARIPiprazole (ABILIFY) 5 MG tablet   Oral   Take 5 mg by mouth daily.         Marland Kitchen aspirin 325 MG tablet   Oral   Take 1,300 mg by mouth once.         . DULoxetine (CYMBALTA) 60 MG capsule   Oral   Take 60 mg by mouth daily.         . Fluticasone-Salmeterol (ADVAIR) 250-50 MCG/DOSE AEPB   Inhalation   Inhale 1 puff into the lungs 2 (two) times daily.         Marland Kitchen gabapentin (NEURONTIN) 300 MG capsule   Oral   Take 1 capsule (300 mg total) by mouth 4 (four) times daily as needed (pain).   120 capsule   1   . Green Tea, Camillia sinensis, (GREEN TEA EXTRACT PO)   Oral   Take 1 each by mouth daily.         Marland Kitchen ibuprofen (ADVIL,MOTRIN) 200 MG tablet   Oral   Take 600 mg by mouth  every 6 (six) hours as needed (pain).         . LORazepam (ATIVAN) 0.5 MG tablet   Oral   Take 0.5 mg by mouth every 8 (eight) hours as needed for anxiety.         . methocarbamol (ROBAXIN) 500 MG tablet   Oral   Take 1 tablet (500 mg total) by mouth every 6 (six) hours as needed for muscle spasms.   30 tablet   0   . metoprolol (LOPRESSOR) 100 MG tablet   Oral   Take 1 tablet (100 mg total) by mouth daily. Patient taking differently: Take 100-200 mg by mouth 2 (two) times daily as needed (heartrate). Take '100mg'$  in the morning and may take an extra dose if HR is elevated   30 tablet   0   . Misc Natural Products (CURCUMAX PRO PO)   Oral   Take 1 tablet by mouth daily.         . modafinil (PROVIGIL) 200 MG tablet   Oral   Take 200 mg by mouth daily as needed (work).          . Omega-3 Fatty Acids (FISH OIL) 1200 MG  CAPS   Oral   Take 1 capsule by mouth daily.          . ondansetron (ZOFRAN-ODT) 4 MG disintegrating tablet   Oral   Take 4 mg by mouth every 8 (eight) hours as needed for nausea or vomiting.         Marland Kitchen oxyCODONE-acetaminophen (PERCOCET) 10-325 MG per tablet   Oral   Take 1 tablet by mouth every 6 (six) hours as needed for pain.   90 tablet   0   . oxyCODONE-acetaminophen (PERCOCET/ROXICET) 5-325 MG per tablet   Oral   Take 2 tablets by mouth every 6 (six) hours as needed for moderate pain.   30 tablet   0   . varenicline (CHANTIX) 1 MG tablet   Oral   Take 1 mg by mouth 2 (two) times daily.           Allergies Iodine  Family History  Problem Relation Age of Onset  . Heart attack Father 28    MI x 3     Social History History  Substance Use Topics  . Smoking status: Former Smoker -- 0.50 packs/day for 50 years    Types: Cigarettes  . Smokeless tobacco: Not on file  . Alcohol Use: No    Review of Systems Constitutional: Negative for fever. Cardiovascular: Negative for chest pain. Respiratory: Positive for shortness breath 4 days. Occasional cough. Gastrointestinal: Negative for abdominal pain, vomiting and diarrhea. Patient does note some mild swelling to her abdomen, denies constipation, normal bowel movement today. Genitourinary: Negative for dysuria. Musculoskeletal: Positive for back pain patient states this is unchanged from her chronic back pain. Neurological: Negative for headache 10-point ROS otherwise negative.  ____________________________________________   PHYSICAL EXAM:  VITAL SIGNS: ED Triage Vitals  Enc Vitals Group     BP 08/22/14 1320 153/88 mmHg     Pulse Rate 08/22/14 1320 90     Resp 08/22/14 1320 26     Temp 08/22/14 1320 98 F (36.7 C)     Temp Source 08/22/14 1320 Oral     SpO2 08/22/14 1320 95 %     Weight 08/22/14 1320 170 lb (77.111 kg)     Height 08/22/14 1320 '5\' 5"'$  (1.651 m)     Head Cir --  Peak Flow --       Pain Score 08/22/14 1321 5     Pain Loc --      Pain Edu? --      Excl. in [place]? --     Constitutional: Alert and oriented. Well appearing and in no distress. Eyes: Normal exam ENT   Mouth/Throat: Mucous membranes are moist. Cardiovascular: Normal rate, regular rhythm. No murmurs, rubs, or gallops. Respiratory: No respiratory distress. Bilateral rales auscultated, moderate wheeze bilaterally. No rhonchi. Gastrointestinal: Soft and nontender. No appreciable swelling. Obese. Dull percussion. Musculoskeletal: Patient able to move all extremities. She states she can move her lower extremity she just has trouble bearing weight. Neurologic:  Normal speech and language. She does note some decreased sensation in the lower extremities but states this is chronic. Skin:  Skin is warm, dry and intact.  Psychiatric: Mood and affect are normal. Speech and behavior are normal. Patient exhibits appropriate insight and judgment.  ____________________________________________    INITIAL IMPRESSION / ASSESSMENT AND PLAN / ED COURSE  Pertinent labs & imaging results that were available during my care of the patient were reviewed by me and considered in my medical decision making (see chart for details).  Patient presents with a fall, but no complaints related to the fall. Patient's main complaint here today is 4 days of difficulty breathing. Patient has a history of COPD with crackles and wheezes bilaterally. 95% O2 sat duration on room air currently. We will treat with DuoNeb's, Solu-Medrol, obtain labs and a chest x-ray to help further evaluate. I discussed this plan of care with the patient who is agreeable.  EKG reviewed and interpreted by myself shows sinus rhythm at 89 bpm, narrow QRS, normal axis, normal intervals, nonspecific ST changes are present. No ST elevations noted.   Patient's labs are largely within normal limits. Chest x-ray shows no edema or acute findings. Patient has received multiple  DuoNeb's, continues with 86-88% oxygen saturation on room air. We will admit for COPD exacerbation.  ____________________________________________   FINAL CLINICAL IMPRESSION(S) / ED DIAGNOSES  Fall Dyspnea COPD exacerbation  Harvest Dark, MD 08/22/14 [id number]

## 2014-08-22 NOTE — Progress Notes (Signed)
   08/22/14 2000  Clinical Encounter Type  Visited With Patient  Visit Type Initial;Spiritual support  Referral From Nurse  Consult/Referral To Chaplain  Spiritual Encounters  Spiritual Needs Literature;Prayer;Emotional  Stress Factors  Patient Stress Factors Family relationships;Health changes;Major life changes  Met w/patient. Provided and explained AD, offered pastoral care & prayer.  Chap. Annalaura Sauseda G. Helen

## 2014-08-22 NOTE — ED Notes (Signed)
Presents via ems from home s/p fall   States she developed some SOB and abd swelling over the past 5 days.

## 2014-08-22 NOTE — ED Notes (Addendum)
Patient denies pain, reports experiencing "a little SOB" but feels like her ability to breath has improved and is resting comfortably.

## 2014-08-23 ENCOUNTER — Encounter: Payer: Self-pay | Admitting: Internal Medicine

## 2014-08-23 DIAGNOSIS — I5023 Acute on chronic systolic (congestive) heart failure: Secondary | ICD-10-CM | POA: Insufficient documentation

## 2014-08-23 DIAGNOSIS — J441 Chronic obstructive pulmonary disease with (acute) exacerbation: Principal | ICD-10-CM

## 2014-08-23 DIAGNOSIS — R0602 Shortness of breath: Secondary | ICD-10-CM

## 2014-08-23 LAB — COMPREHENSIVE METABOLIC PANEL
ALT: 15 U/L (ref 14–54)
AST: 22 U/L (ref 15–41)
Albumin: 3.7 g/dL (ref 3.5–5.0)
Alkaline Phosphatase: 83 U/L (ref 38–126)
Anion gap: 12 (ref 5–15)
BUN: 14 mg/dL (ref 6–20)
CALCIUM: 9.3 mg/dL (ref 8.9–10.3)
CO2: 27 mmol/L (ref 22–32)
Chloride: 101 mmol/L (ref 101–111)
Creatinine, Ser: 0.72 mg/dL (ref 0.44–1.00)
GFR calc non Af Amer: >60 mL/min (ref >60)
GLUCOSE: 171 mg/dL — AB (ref 65–99)
POTASSIUM: 3.7 mmol/L (ref 3.5–5.1)
Sodium: 140 mmol/L (ref 135–145)
Total Bilirubin: 0.2 mg/dL — ABNORMAL LOW (ref 0.3–1.2)
Total Protein: 7.1 g/dL (ref 6.5–8.1)

## 2014-08-23 MED ORDER — ASPIRIN EC 325 MG PO TBEC
325.0000 mg | DELAYED_RELEASE_TABLET | Freq: Every day | ORAL | Status: DC
  Administered 2014-08-23 – 2014-08-24 (×2): 325 mg via ORAL
  Filled 2014-08-23 (×2): qty 1

## 2014-08-23 MED ORDER — OXYCODONE-ACETAMINOPHEN 5-325 MG PO TABS
2.0000 | ORAL_TABLET | ORAL | Status: DC | PRN
  Administered 2014-08-24 (×3): 2 via ORAL
  Filled 2014-08-23 (×3): qty 2

## 2014-08-23 MED ORDER — FUROSEMIDE 10 MG/ML IJ SOLN
20.0000 mg | Freq: Three times a day (TID) | INTRAMUSCULAR | Status: DC
  Administered 2014-08-23: 20 mg via INTRAVENOUS
  Filled 2014-08-23: qty 2

## 2014-08-23 MED ORDER — LOSARTAN POTASSIUM 25 MG PO TABS
25.0000 mg | ORAL_TABLET | Freq: Every day | ORAL | Status: DC
  Administered 2014-08-24: 25 mg via ORAL
  Filled 2014-08-23: qty 1

## 2014-08-23 MED ORDER — OXYCODONE-ACETAMINOPHEN 5-325 MG PO TABS
1.0000 | ORAL_TABLET | ORAL | Status: DC | PRN
  Administered 2014-08-23 (×2): 1 via ORAL
  Filled 2014-08-23 (×3): qty 1

## 2014-08-23 MED ORDER — FUROSEMIDE 20 MG PO TABS
20.0000 mg | ORAL_TABLET | Freq: Every day | ORAL | Status: DC
  Administered 2014-08-24: 20 mg via ORAL
  Filled 2014-08-23: qty 1

## 2014-08-23 NOTE — Evaluation (Signed)
Physical Therapy Evaluation Patient Details Name: Grace [name] MRN: [id number] DOB: 1950-03-05 Today's Date: 08/23/2014   History of Present Illness  Grace [name] is a 64 yo female with a known history of chronic back pain, COPD, depression comes in secondary to low back pain, also shortness of breath for 3-4 days. Patient recently was hospitalized for COPD exacerbation and back pain in May. Patient seen in the emergency room end of June secondary to back pain found to have T4 compression fractures and was referred to [facility] patient did not have any back surgery at [facility] and was told to wear a back brace. Patient is not able to walk secondary to severe back pain and weakness in the legs. Patient is mostly bedbound due to back pain over the last 2-3 weeks. Pt states she has been performing bed to [facility] stand pivot transfers for toileting. She arrived at [facility] with complaints of shortness of breath going on for 3 days and also abdominal swelling. Saw Dr. Esmond Plants when she was at [facility], EF 35  To 40%. Pt reports 5 falls over the last 12 months. 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   Clinical Impression  Pt demonstrates good strength with bed mobility but considerable weakness with transfers and ambulation. She is only able to take a few small steps at EOB due to pain and weakness. However, this is an improvement over the last 2-3 weeks. Pt needs physical therapy at discharge and the best place for her to go would be SNF. Pt has some concerns about cost. If she is unable/unwilling to go to SNF will need HH PT. Pt will benefit from skilled PT services to address deficits in strength, balance, and mobility in order to return to full function at home.     Follow Up Recommendations SNF (Pt reports concern for cost)    Equipment Recommendations  None recommended by PT    Recommendations for Other Services        Precautions / Restrictions Precautions Precautions: Back;Fall Required Braces or Orthoses:  (Pt has a spinal brace at home, not at hospital) Restrictions Weight Bearing Restrictions: No      Mobility  Bed Mobility Overal bed mobility: Needs Assistance Bed Mobility: Supine to Sit;Sit to Supine     Supine to sit: HOB elevated;Modified independent (Device/Increase time) (Use of bed rails) Sit to supine: HOB elevated;Min assist (Use of bed rails. Assist for bilateral LEs)   General bed mobility comments: Pt demonstrates good sequencing and strength. Requires reminder to use log-rolling technique  Transfers Overall transfer level: Needs assistance Equipment used: Rolling walker (2 wheeled) Transfers: Sit to/from Stand Sit to Stand: Min assist         General transfer comment: Pt with decreased LE strength and power. Limited by back pain. Cues for hand placement. Pt is tremulous throughout transfer with intermittent LE buckling  Ambulation/Gait Ambulation/Gait assistance: Mod assist Ambulation Distance (Feet): 2 Feet Assistive device: Rolling walker (2 wheeled) Gait Pattern/deviations: Step-to pattern   Gait velocity interpretation: <1.8 ft/sec, indicative of risk for recurrent falls General Gait Details: Pt able to take a few small steps forward/backward with rolling walker. She is very weak in LE with shaking and buckling. Unsafe and unwilling to attempt further ambulation. Cues for sequencing and safety.   Stairs            Wheelchair Mobility    Modified  Rankin (Stroke Patients Only)       Balance Overall balance assessment: Needs assistance Sitting-balance support: No upper extremity supported Sitting balance-Leahy Scale: Fair     Standing balance support: Bilateral upper extremity supported Standing balance-Leahy Scale: Poor                               Pertinent Vitals/Pain Pain Assessment: 0-10 Pain Score: 2  Pain Location: L  flank Pain Intervention(s): Monitored during session    Home Living Family/patient expects to be discharged to:: Private residence Living Arrangements: Spouse/significant other (spouse is disabled) Available Help at Discharge: Family;Available PRN/intermittently (daughter ) Type of Home: House Home Access: Stairs to enter Entrance Stairs-Rails: Left Entrance Stairs-Number of Steps: 7 Home Layout: Laundry or work area in basement;Able to live on main level with bedroom/bathroom;Two level Home Equipment: Walker - 2 wheels;Bedside commode;Grab bars - toilet;Shower seat (tub shower, no grab bars,) Additional Comments: Pt reports her husband is disabled and unable to provide much assistance. Pt reports her daughter can help her with household needs.    Prior Function Level of Independence: Independent         Comments: Pt working full time as Therapist, sports in ICU at [employer] before her compression fracture     Hand Dominance   Dominant Hand: Right    Extremity/Trunk Assessment   Upper Extremity Assessment: Overall WFL for tasks assessed           Lower Extremity Assessment: Generalized weakness         Communication   Communication: No difficulties  Cognition Arousal/Alertness: Awake/alert Behavior During Therapy: WFL for tasks assessed/performed Overall Cognitive Status: Within Functional Limits for tasks assessed                      General Comments      Exercises        Assessment/Plan    PT Assessment Patient needs continued PT services  PT Diagnosis Difficulty walking;Abnormality of gait;Generalized weakness;Acute pain   PT Problem List Decreased strength;Decreased activity tolerance;Decreased balance;Decreased mobility;Decreased safety awareness;Obesity;Pain;Cardiopulmonary status limiting activity  PT Treatment Interventions DME instruction;Gait training;Stair training;Functional mobility training;Therapeutic activities;Therapeutic exercise;Balance  training;Patient/family education   PT Goals (Current goals can be found in the Care Plan section) Acute Rehab PT Goals Patient Stated Goal: "I want to be able to walk again" PT Goal Formulation: With patient Time For Goal Achievement: 09/06/14 Potential to Achieve Goals: Good    Frequency Min 2X/week   Barriers to discharge Inaccessible home environment Stairs to enter    Co-evaluation               End of Session Equipment Utilized During Treatment: Gait belt Activity Tolerance: No increased pain;Patient limited by fatigue Patient left: in chair;with call bell/phone within reach;with chair alarm set Nurse Communication: Mobility status (Please check for care management consult)         Time: 1050-1110 PT Time Calculation (min) (ACUTE ONLY): 20 min   Charges:   PT Evaluation $Initial PT Evaluation Tier I: 1 Procedure     PT G Codes:       Lyndel Safe Huprich PT, DPT   Huprich,Jason 08/23/2014, 12:12 PM

## 2014-08-23 NOTE — Progress Notes (Deleted)
PT Refusal Note  Patient Details Name: Grace [name] MRN: [id number] DOB: 1951-01-23   Cancelled Treatment:    Reason Eval/Treat Not Completed: Patient declined, no reason specified Attempted to evaluate patient. Pt reporting that she is too short of breath to participate with therapy at this time. Extensive encouragement provided along with importance of evaluation for discharge recommendations and to document cardiopulmonary status with activity. Pt continues to refuse. Will attempt evaluation on later date as pt is willing participate. RN encouraged to call PT if patient changes her mind and is willing to participate.   Lyndel Safe Dorthey Depace PT, DPT   Hanne Kegg 08/23/2014, 10:21 AM

## 2014-08-23 NOTE — Progress Notes (Signed)
Received report from Abigail at 1500. 2 L of oxygen. NSR. Takes meds ok. Worked with PT and tolerated it well. Bedpan. Pt complained of back pain and received percocet. Pt has no further concerns at this time.

## 2014-08-23 NOTE — Progress Notes (Signed)
[place] [facility] [place] NAME: Grace [name]    MR#:  [id number]  DATE OF BIRTH:  21-Jun-1950  SUBJECTIVE:  CHIEF COMPLAINT:   Chief Complaint  Patient presents with  . Shortness of Breath    REVIEW OF SYSTEMS:  CONSTITUTIONAL: No fever, fatigue or weakness.  EYES: No blurred or double vision.  EARS, NOSE, AND THROAT: No tinnitus or ear pain.  RESPIRATORY: No cough, shortness of breath, wheezing or hemoptysis.  CARDIOVASCULAR: No chest pain, orthopnea, edema.  GASTROINTESTINAL: No nausea, vomiting, diarrhea or abdominal pain. Abdominal swelling. GENITOURINARY: No dysuria, hematuria.  ENDOCRINE: No polyuria, nocturia,  HEMATOLOGY: No anemia, easy bruising or bleeding SKIN: No rash or lesion. MUSCULOSKELETAL: No joint pain or arthritis.   NEUROLOGIC: No tingling, numbness, weakness.  PSYCHIATRY: No anxiety or depression.   ROS  DRUG ALLERGIES:   Allergies  Allergen Reactions  . Iodine Anaphylaxis    IVP dye    VITALS:  Blood pressure 139/84, pulse 92, temperature 98.3 F (36.8 C), temperature source Oral, resp. rate 18, height '5\' 5"'$  (1.651 m), weight 81.103 kg (178 lb 12.8 oz), SpO2 94 %.  PHYSICAL EXAMINATION:  GENERAL:  64 y.o.-year-old patient lying in the bed with no acute distress.  EYES: Pupils equal, round, reactive to light and accommodation. No scleral icterus. Extraocular muscles intact.  HEENT: Head atraumatic, normocephalic. Oropharynx and nasopharynx clear.  NECK:  Supple, no jugular venous distention. No thyroid enlargement, no tenderness.  LUNGS: Normal breath sounds bilaterally, no wheezing,mild crepitation. No use of accessory muscles of respiration.  CARDIOVASCULAR: S1, S2 normal. No murmurs, rubs, or gallops.  ABDOMEN: Soft, nontender, mild distended. Bowel sounds present. No organomegaly or mass.  EXTREMITIES: No pedal edema, cyanosis, or clubbing.  NEUROLOGIC: Cranial nerves II through XII are intact.  Muscle strength 5/5 in all extremities. Sensation intact. Gait not checked.  PSYCHIATRIC: The patient is alert and oriented x 3.  SKIN: No obvious rash, lesion, or ulcer.   Physical Exam LABORATORY PANEL:   CBC  Recent Labs Lab 08/22/14 1329  WBC 8.4  HGB 12.7  HCT 37.8  PLT 225   ------------------------------------------------------------------------------------------------------------------  Chemistries   Recent Labs Lab 08/23/14 0454  NA 140  K 3.7  CL 101  CO2 27  GLUCOSE 171*  BUN 14  CREATININE 0.72  CALCIUM 9.3  AST 22  ALT 15  ALKPHOS 83  BILITOT 0.2*   ------------------------------------------------------------------------------------------------------------------  Cardiac Enzymes  Recent Labs Lab 08/22/14 1329  TROPONINI <0.03   ------------------------------------------------------------------------------------------------------------------  RADIOLOGY:  Dg Chest Portable 1 View  08/22/2014   CLINICAL DATA:  Short of breath for 5 days.  Initial encounter.  EXAM: PORTABLE CHEST - 1 VIEW  COMPARISON:  06/30/2014.  04/05/2013.  FINDINGS: The exam is under penetrated. There is opacity at the LEFT lung base which given the underpenetration is due to overlapping soft tissue. The chest radiograph appears similar to the prior exam from 06/30/2014.  Monitoring leads project over the chest. Subsegmental atelectasis is present on the RIGHT radiating from the infrahilar region. Aortic arch atherosclerosis. Cardiopericardial silhouette within normal limits for projection.  IMPRESSION: 1. Subsegmental atelectasis in the RIGHT lung. 2. Exam technically degraded by underpenetration. No acute cardiopulmonary disease.   Electronically Signed   By: Dereck Ligas M.D.   On: 08/22/2014 14:28    ASSESSMENT AND PLAN:   * acute on chronic systolic heart failure given the BNP is normal. Patient will be started on IV Lasix, continue  oxygen. Continue low-sodium diet, daily  weights.2. 2. Consult with Cardiology. *Chronic back pain with recent T4-T6 vertebral compression fractures: Patient will get pain medications as per home meds, physical therapy evaluation. Cont follow with [facility]. * COPD - stable, follows up with Dr. Raul Del, continue home medications. * Depression: Continue Cymbalta, Abilify. * Peptic ulcer disease: Continue her medications. * Hypertension: Controlled, continue metoprolol 100 MG daily   All the records are reviewed and case discussed with Care Management/Social Workerr. Management plans discussed with the patient, family and they are in agreement.  CODE STATUS: full.  TOTAL TIME TAKING CARE OF THIS PATIENT: 35 minutes.   More than 50% of the visit was spent in counseling/coordination of care  POSSIBLE D/C IN 1-2 DAYS, DEPENDING ON CLINICAL CONDITION.   Vaughan Basta M.D on 08/23/2014   Between 7am to 6pm - Pager - [phone]  After 6pm go to [email or web address] - password [email or web address] [facility]  [place] [facility]  Office  [phone]  CC: Primary care physician; [name], MD

## 2014-08-23 NOTE — Consult Note (Addendum)
CARDIOLOGY CONSULT NOTE     Primary Care Physician: [name], MD Referring Physician:  Hospitalist  Admit Date: 08/22/2014  Reason for consultation:  SOB  Grace [name] is a 64 y.o. female with a h/o nonischemic CM (EF 35-40%), NYHA Class II CHF, obesity, longstanding tobacco use and COPD who is admitted with SOB.  She also reports abdominal distension.  She received IV Lasix in the ER and feels "much better".   Her primary concern is with vertebral compression fractures.  Today, she denies symptoms of palpitations, chest pain,  lower extremity edema, dizziness, presyncope, syncope, or neurologic sequela. The patient is tolerating medications without difficulties and is otherwise without complaint today.   Past Medical History  Diagnosis Date  . Cardiomyopathy     nonischemic (EF 35-40%)  . Hypertension   . Hyperlipidemia   . Asthma   . COPD (chronic obstructive pulmonary disease)   . PVC's (premature ventricular contractions)    Past Surgical History  Procedure Laterality Date  . Cardiac catheterization    . Cesarean section    . Appendectomy    . Cataract extraction      right eye     . albuterol  2.5 mg Nebulization Q6H  . ARIPiprazole  5 mg Oral Daily  . aspirin EC  325 mg Oral Daily  . DULoxetine  60 mg Oral Daily  . enoxaparin (LOVENOX) injection  40 mg Subcutaneous Q24H  . furosemide  20 mg Intravenous Q8H  . metoprolol  100 mg Oral Daily  . mometasone-formoterol  2 puff Inhalation BID  . omega-3 acid ethyl esters  1 g Oral Daily  . pantoprazole  40 mg Oral Daily  . varenicline  1 mg Oral BID      Allergies  Allergen Reactions  . Iodine Anaphylaxis    IVP dye    History   Social History  . Marital Status: Married    Spouse Name: N/A  . Number of Children: N/A  . Years of Education: N/A   Occupational History  . Not on file.   Social History Main Topics  . Smoking status: Former Smoker -- 0.50 packs/day for 50 years    Types: Cigarettes    . Smokeless tobacco: Not on file  . Alcohol Use: No  . Drug Use: No  . Sexual Activity: Not Currently   Other Topics Concern  . Not on file   Social History Narrative    Family History  Problem Relation Age of Onset  . Heart attack Father 42    MI x 3     ROS- All systems are reviewed and negative except as per the HPI above  Physical Exam: Telemetry: Filed Vitals:   08/23/14 0740 08/23/14 0814 08/23/14 1136 08/23/14 1330  BP:  133/97 139/84   Pulse:  95 92   Temp:  97.9 F (36.6 C) 98.3 F (36.8 C)   TempSrc:  Oral Oral   Resp:  18 18   Height:      Weight:      SpO2: 90% 91% 93% 94%    GEN- The patient is chronically ill appearing, alert and oriented x 3 today.   Head- normocephalic, atraumatic Eyes-  Sclera clear, conjunctiva pink Ears- hearing intact Oropharynx- clear with dry MM Neck- supple, no JVP Lungs- few basilar rales, diffuse expiratory wheezes with prolonged expiratory phase Heart- Regular rate and rhythm  GI- soft, NT, ND, + BS Extremities- no clubbing, cyanosis, or edema MS- BLE muscle atrophy  Skin- no rash or lesion Psych- euthymic mood, full affect Neuro- strength and sensation are intact  EKG: sinus rhythm 89 bpm, nonspecific ST/T changes  Labs:   Lab Results  Component Value Date   WBC 8.4 08/22/2014   HGB 12.7 08/22/2014   HCT 37.8 08/22/2014   MCV 93.7 08/22/2014   PLT 225 08/22/2014    Recent Labs Lab 08/23/14 0454  NA 140  K 3.7  CL 101  CO2 27  BUN 14  CREATININE 0.72  CALCIUM 9.3  PROT 7.1  BILITOT 0.2*  ALKPHOS 83  ALT 15  AST 22  GLUCOSE 171*   Lab Results  Component Value Date   TROPONINI <0.03 08/22/2014   No results found for: CHOL No results found for: HDL No results found for: LDLCALC No results found for: TRIG No results found for: CHOLHDL No results found for: LDLDIRECT   CXR reveals atx, no significant edema  Dr Gwenyth Ober office notes and echo are reviewed  ASSESSMENT AND PLAN:   1.  SOB multifactoral Likely components of both COPD and CHF. She has already diuresed 3 L and is feeling better. Would convert to lasix '20mg'$  po daily in am Add ARB (She is afraid of risk of angioedema with ACE inhibitor even though she has never tried these medicines)  2. Acute on chronic systolic dysfunction As above Likely exacerbated by NSAIDS for back pain Would be very cautious with NSAIDS! 2 gram sodium diet Add arb  3. HTN Stable Add arb as above   Anticipate possible discharge in am Cardiology to see as needed over the weekend. Will need to follow-up closely with Dr Rockey Situ in the office.  Thompson Grayer, MD 08/23/2014  5:56 PM

## 2014-08-23 NOTE — Progress Notes (Signed)
Initial Nutrition Assessment  INTERVENTION:  Meals and Snacks: Cater to patient preferences Education: will attempt education on low sodium nutrition therapy on follow-up   NUTRITION DIAGNOSIS:  Inadequate oral intake related to  (decreased appetite) as evidenced by per patient/family report.  GOAL:  Patient will meet greater than or equal to 90% of their needs  MONITOR:   (Energy Intake, Anthropometrics, Digestive System)  REASON FOR ASSESSMENT:   (RD Screen, Diagnosis)    ASSESSMENT:  Pt admitted with possible acute on chronic CHF with a h/o recent T4 compression fractures (no surgical intervention).  PMHx:  Past Medical History  Diagnosis Date  . Cardiomyopathy   . Hypertension   . Hyperlipidemia   . Asthma   . COPD (chronic obstructive pulmonary disease)   . PVC's (premature ventricular contractions)     Diet Order:  Diet 2 gram sodium Room service appropriate?: Yes; Fluid consistency:: Thin  Current Nutrition: Pt reports appetite has been very good since admission eating 100% of dinner last night and breakfast this am of oatmeal and french toast. Pt reports mother has brought in sandwich for snack as well.   Food/Nutrition-Related History: Pt reports decreased appetite secondary to back pain for the past week of so but still trying to eat multiple times a day.   Medications: Lasix, Protonix  Electrolyte/Renal Profile and Glucose Profile:   Recent Labs Lab 08/22/14 1329 08/23/14 0454  NA 145 140  K 4.0 3.7  CL 108 101  CO2 29 27  BUN 12 14  CREATININE 0.63 0.72  CALCIUM 9.3 9.3  GLUCOSE 108* 171*   Protein Profile:  Recent Labs Lab 08/22/14 1329 08/23/14 0454  ALBUMIN 3.7 3.7    Gastrointestinal Profile: Last BM: 7/8    Weight Change: Pt reports stable weight PTA of 170lbs. Per CHL pt weight 170lbs for the past 2 months. Anthropometrics:   Height:  Ht Readings from Last 1 Encounters:  08/22/14 '5\' 5"'$  (1.651 m)    Weight:  Wt  Readings from Last 1 Encounters:  08/23/14 178 lb 12.8 oz (81.103 kg)    Wt Readings from Last 10 Encounters:  08/23/14 178 lb 12.8 oz (81.103 kg)  07/15/14 169 lb 12.8 oz (77.021 kg)  07/15/14 170 lb (77.111 kg)  07/01/14 170 lb 14.4 oz (77.52 kg)  05/08/14 172 lb 8 oz (78.245 kg)    BMI:  Body mass index is 29.75 kg/(m^2).  Skin:  Reviewed, no issues   EDUCATION NEEDS:  Education needs no appropriate at this time    [place], RD, LDN Pager [phone]

## 2014-08-24 MED ORDER — FUROSEMIDE 20 MG PO TABS: 20.0000 mg | ORAL_TABLET | Freq: Every day | ORAL | Status: DC

## 2014-08-24 MED ORDER — LOSARTAN POTASSIUM 25 MG PO TABS: 25.0000 mg | ORAL_TABLET | Freq: Every day | ORAL | Status: DC

## 2014-08-24 MED ORDER — OXYCODONE-ACETAMINOPHEN 10-325 MG PO TABS: 1.0000 | ORAL_TABLET | Freq: Four times a day (QID) | ORAL | Status: DC | PRN

## 2014-08-24 NOTE — Progress Notes (Signed)
Wean to room air. RA 91%. Refused to ambulate. NSR. Pt reported pain in back and received percocet. Takes meds ok. A & O. IV and tele removed. Discharge instructions given to pt. Prescriptions given to pt. Pt has no further concerns at this time.

## 2014-08-24 NOTE — Discharge Instructions (Signed)
Fluid restriction up to 1200 ml daily Low salt diet. Daily weigh your self, if > 2 Lb gain in a day or > 5 Lb in 1 week- take lasix 20 mg 2 times a day for 2 days- and still not able to get rid of extra weight- call your doctor or cardiologist.

## 2014-08-24 NOTE — Progress Notes (Signed)
   08/24/14 1000  Clinical Encounter Type  Visited With Patient  Visit Type Follow-up  Referral From Chaplain  Consult/Referral To Chaplain  Visited with patient in her room.  Advance directive follow up.  Patient wants to discuss things with her husband and is going to be discharged today.  Will complete AD somewhere else and bring back to [facility] to have put into her medical record.  Pt said she was feeling good and thanked me for coming to visit with her and follow up.  [place] [phone]

## 2014-08-24 NOTE — Discharge Summary (Signed)
[facility] NAME: [name]    MR#:  [id number]  DATE OF BIRTH:  16-Jun-1950  DATE OF ADMISSION:  08/22/2014 ADMITTING PHYSICIAN: Epifanio Lesches, MD  DATE OF DISCHARGE: 08/24/2014  PRIMARY CARE PHYSICIAN: [name], MD    ADMISSION DIAGNOSIS:  COPD exacerbation [J44.1]  DISCHARGE DIAGNOSIS:  Active Problems:   CHF (congestive heart failure), NYHA class I   COPD exacerbation   Systolic dysfunction with acute on chronic heart failure   SECONDARY DIAGNOSIS:   Past Medical History  Diagnosis Date  . Cardiomyopathy     nonischemic (EF 35-40%)  . Hypertension   . Hyperlipidemia   . Asthma   . COPD (chronic obstructive pulmonary disease)   . PVC's (premature ventricular contractions)     HOSPITAL COURSE:   * acute on chronic systolic heart failure given the BNP is normal. Patient will be started on IV Lasix, continue oxygen. Continue low-sodium diet, daily weights.2. 2. Consulted with Cardiology.  Pt had total 3 ltr diuresis and she felt better, weaned off oxygen.  Cardiologist suggested to start on losartan and lasix daily and to cut down use of NSAIDs.   Follow with Dr. Rockey Situ in 1 week. *Chronic back pain with recent T4-T6 vertebral compression fractures:    pain medications as per home meds, physical therapy evaluation. Cont follow with [facility]. * COPD - stable, follows up with Dr. Raul Del, continue home medications. * Depression: Continue Cymbalta, Abilify. * Peptic ulcer disease: Continue her medications. * Hypertension: Controlled, continue metoprolol 100 MG daily   DISCHARGE CONDITIONS:  Stable.  CONSULTS OBTAINED:  Treatment Team:  Thompson Grayer, MD Minna Merritts, MD  DRUG ALLERGIES:   Allergies  Allergen Reactions  . Iodine Anaphylaxis    IVP dye    DISCHARGE MEDICATIONS:   Current Discharge Medication List    START taking these medications   Details   furosemide (LASIX) 20 MG tablet Take 1 tablet (20 mg total) by mouth daily. Qty: 30 tablet, Refills: 0    losartan (COZAAR) 25 MG tablet Take 1 tablet (25 mg total) by mouth daily. Qty: 30 tablet, Refills: 0      CONTINUE these medications which have CHANGED   Details  oxyCODONE-acetaminophen (PERCOCET) 10-325 MG per tablet Take 1 tablet by mouth every 6 (six) hours as needed for pain. Qty: 15 tablet, Refills: 0      CONTINUE these medications which have NOT CHANGED   Details  albuterol (PROVENTIL HFA;VENTOLIN HFA) 108 (90 BASE) MCG/ACT inhaler Inhale 2 puffs into the lungs every 4 (four) hours as needed for shortness of breath.     ARIPiprazole (ABILIFY) 5 MG tablet Take 5 mg by mouth daily.    aspirin 325 MG tablet Take 325 mg by mouth daily.     DULoxetine (CYMBALTA) 60 MG capsule Take 60 mg by mouth daily.    Fluticasone-Salmeterol (ADVAIR) 250-50 MCG/DOSE AEPB Inhale 1 puff into the lungs 2 (two) times daily.    gabapentin (NEURONTIN) 300 MG capsule Take 1 capsule (300 mg total) by mouth 4 (four) times daily as needed (pain). Qty: 120 capsule, Refills: 1    Green Tea, Camillia sinensis, (GREEN TEA EXTRACT PO) Take 1 each by mouth daily.    LORazepam (ATIVAN) 0.5 MG tablet Take 0.5 mg by mouth every 8 (eight) hours as needed for anxiety.    methocarbamol (ROBAXIN) 500 MG tablet Take 1 tablet (500 mg total) by mouth every  6 (six) hours as needed for muscle spasms. Qty: 30 tablet, Refills: 0    metoprolol (LOPRESSOR) 100 MG tablet Take 1 tablet (100 mg total) by mouth daily. Qty: 30 tablet, Refills: 0    Misc Natural Products (CURCUMAX PRO PO) Take 1 tablet by mouth daily.    modafinil (PROVIGIL) 200 MG tablet Take 200 mg by mouth daily as needed (work).     Omega-3 Fatty Acids (FISH OIL) 1200 MG CAPS Take 1 capsule by mouth daily.     pantoprazole (PROTONIX) 40 MG tablet Take 40 mg by mouth daily.    varenicline (CHANTIX) 1 MG tablet Take 1 mg by mouth 2 (two) times  daily.    ondansetron (ZOFRAN-ODT) 4 MG disintegrating tablet Take 4 mg by mouth every 8 (eight) hours as needed for nausea or vomiting.      STOP taking these medications     ibuprofen (ADVIL,MOTRIN) 200 MG tablet      oxyCODONE-acetaminophen (PERCOCET/ROXICET) 5-325 MG per tablet          DISCHARGE INSTRUCTIONS:    Follow with cardiology in clinic in 1 week. Fluid restriction up to 1200 ml daily. Low salt diet.  If you experience worsening of your admission symptoms, develop shortness of breath, life threatening emergency, suicidal or homicidal thoughts you must seek medical attention immediately by calling 911 or calling your MD immediately  if symptoms less severe.  You Must read complete instructions/literature along with all the possible adverse reactions/side effects for all the Medicines you take and that have been prescribed to you. Take any new Medicines after you have completely understood and accept all the possible adverse reactions/side effects.   Please note  You were cared for by a hospitalist during your hospital stay. If you have any questions about your discharge medications or the care you received while you were in the hospital after you are discharged, you can call the unit and asked to speak with the hospitalist on call if the hospitalist that took care of you is not available. Once you are discharged, your primary care physician will handle any further medical issues. Please note that NO REFILLS for any discharge medications will be authorized once you are discharged, as it is imperative that you return to your primary care physician (or establish a relationship with a primary care physician if you do not have one) for your aftercare needs so that they can reassess your need for medications and monitor your lab values.    Today   CHIEF COMPLAINT:   Chief Complaint  Patient presents with  . Shortness of Breath    HISTORY OF PRESENT ILLNESS:  Grace [name]  is a 64 y.o. female with a known history of chronic back pain, COPD, depression comes in secondary to low back pain, also shortness of breath for 3-4 days. Patient recently was hospitalized for COPD exacerbation and back pain in May. Patient seen in the emergency room end of June secondary to back pain found to have T4 compression fractures and was referred to [facility] patient did not have any back surgery at [facility] and was told to wear a back brace. Patient is not able to walk secondary to severe back pain and weakness in the legs. Patient is mostly bedbound due to back pain. And the patient denies any numbness, her weakness actually is better today. Patient however is not ambulating for the last 2-3 weeks. Secondary to back pain. Today she says she has shortness  of breath going on for 3 days and also abdominal swelling. Saw Dr. Esmond Plants when she was at [facility], EF 35 To 40%.. Denies any leg swelling. O2 saturations are 92% on 2 L here.   VITAL SIGNS:  Blood pressure 123/70, pulse 69, temperature 97.7 F (36.5 C), temperature source Oral, resp. rate 18, height '5\' 5"'$  (1.651 m), weight 81.103 kg (178 lb 12.8 oz), SpO2 91 %.  I/O:   Intake/Output Summary (Last 24 hours) at 08/24/14 0945 Last data filed at 08/24/14 0427  Gross per 24 hour  Intake      0 ml  Output   1550 ml  Net  -1550 ml    PHYSICAL EXAMINATION:   GENERAL: 64 y.o.-year-old patient lying in the bed with no acute distress.  EYES: Pupils equal, round, reactive to light and accommodation. No scleral icterus. Extraocular muscles intact.  HEENT: Head atraumatic, normocephalic. Oropharynx and nasopharynx clear.  NECK: Supple, no jugular venous distention. No thyroid enlargement, no tenderness.  LUNGS: Normal breath sounds bilaterally, no wheezing,mild crepitation. No use of accessory muscles of respiration.  CARDIOVASCULAR: S1, S2 normal. No murmurs, rubs, or gallops.  ABDOMEN: Soft,  nontender, mild distended. Bowel sounds present. No organomegaly or mass.  EXTREMITIES: No pedal edema, cyanosis, or clubbing.  NEUROLOGIC: Cranial nerves II through XII are intact. Muscle strength 5/5 in all extremities. Sensation intact. Gait not checked.  PSYCHIATRIC: The patient is alert and oriented x 3.  SKIN: No obvious rash, lesion, or ulcer.   DATA REVIEW:   CBC  Recent Labs Lab 08/22/14 1329  WBC 8.4  HGB 12.7  HCT 37.8  PLT 225    Chemistries   Recent Labs Lab 08/23/14 0454  NA 140  K 3.7  CL 101  CO2 27  GLUCOSE 171*  BUN 14  CREATININE 0.72  CALCIUM 9.3  AST 22  ALT 15  ALKPHOS 83  BILITOT 0.2*    Cardiac Enzymes  Recent Labs Lab 08/22/14 1329  TROPONINI <0.03    Microbiology Results  Results for orders placed or performed during the hospital encounter of 06/30/14  Culture, sputum-assessment     Status: None   Collection Time: 06/30/14 10:38 AM  Result Value Ref Range Status   Specimen Description SPUTUM  Final   Special Requests NONE  Final   Sputum evaluation THIS SPECIMEN IS ACCEPTABLE FOR SPUTUM CULTURE  Final   Report Status 06/30/2014 FINAL  Final  Culture, respiratory (NON-Expectorated)     Status: None   Collection Time: 06/30/14 10:38 AM  Result Value Ref Range Status   Specimen Description SPUTUM  Final   Special Requests NONE Reflexed from [id number]  Final   Gram Stain   Final    FAIR SPECIMEN - 70-80% WBCS FEW WBC SEEN MANY GRAM NEGATIVE RODS MODERATE GRAM POSITIVE RODS MODERATE GRAM POSITIVE COCCI IN TETRADS IN CLUSTERS    Culture   Final    APPEARS TO BE NORMAL FLORA GRAM NEGATIVE RODS SEEN ON GRAM STAIN NOT RECOVERED IN CULTURE    Report Status 07/03/2014 FINAL  Final    RADIOLOGY:  Dg Chest Portable 1 View  08/22/2014   CLINICAL DATA:  Short of breath for 5 days.  Initial encounter.  EXAM: PORTABLE CHEST - 1 VIEW  COMPARISON:  06/30/2014.  04/05/2013.  FINDINGS: The exam is under penetrated. There is opacity at  the LEFT lung base which given the underpenetration is due to overlapping soft tissue. The chest radiograph appears similar to the  prior exam from 06/30/2014.  Monitoring leads project over the chest. Subsegmental atelectasis is present on the RIGHT radiating from the infrahilar region. Aortic arch atherosclerosis. Cardiopericardial silhouette within normal limits for projection.  IMPRESSION: 1. Subsegmental atelectasis in the RIGHT lung. 2. Exam technically degraded by underpenetration. No acute cardiopulmonary disease.   Electronically Signed   By: Dereck Ligas M.D.   On: 08/22/2014 14:28     Management plans discussed with the patient, family and they are in agreement.  CODE STATUS:     Code Status Orders        Start     Ordered   08/22/14 1624  Full code   Continuous     08/22/14 1628      TOTAL TIME TAKING CARE OF THIS PATIENT: 35 minutes.    Vaughan Basta M.D on 08/24/2014 at 9:45 AM  Between 7am to 6pm - Pager - [phone]  After 6pm go to [email or web address] - password [email or web address] [facility]  [place] [facility]  Office  [phone]  CC: Primary care physician; [name], MD

## 2014-09-10 ENCOUNTER — Telehealth: Payer: Self-pay

## 2014-09-10 NOTE — Telephone Encounter (Signed)
[facility] labs faxed to [place] and scanned in. Pt requests Dr. Rockey Situ monitor lasix and potassium and kidney function. Request scheduling to call her for appt

## 2014-11-10 ENCOUNTER — Ambulatory Visit: Payer: 59 | Admitting: Cardiovascular Disease

## 2014-12-01 ENCOUNTER — Telehealth: Payer: Self-pay

## 2014-12-01 NOTE — Telephone Encounter (Signed)
Pharmacist has a question regarding Metoprolol tartrate.

## 2014-12-01 NOTE — Telephone Encounter (Signed)
Spoke w/ Ovid Curd @ [facility] pharmacy.   He reports that Dr. Rockey Situ had previously prescribed metoprolol tartrate 100 mg, 2 tabs BID for pt, but most recent rx from hospital is for metoprolol succinate 100 mg daily. He states that she pt's daughter said that Dr. Rockey Situ is her cardiologist and she would like for him to decide on which dose he would like her to be on.  Advised him that pt was given instructions to f/u closely w/ Dr. Rockey Situ after discharge on 08/24/14, but this appt has not been made and she is overdue for 6 mo f/u from March.  Will forward to scheduling to have pt sched an appt. He would like to know what pt should be taking until then.

## 2014-12-01 NOTE — Telephone Encounter (Signed)
Unclear what she will need in terms of dosing for metoprolol On last clinic visit was on metoprolol succinate 100 mg twice a day When she left the hospital in July, they only put her on metoprolol tartrate 100 mg daily, which is inaccurate as this should be a twice a day medication  What has she been taking recently, would probably stay on metoprolol succinate 100 mg daily. Sometimes higher doses can cause bronchospasm If she needs twice a day for tachycardia, we could write metoprolol succinate 100 mg twice a day but would need to monitor her breathing, heart rate, blood pressure  Would make follow-up appointment

## 2014-12-02 MED ORDER — METOPROLOL SUCCINATE ER 100 MG PO TB24: 100.0000 mg | ORAL_TABLET | Freq: Every day | ORAL | Status: DC

## 2014-12-02 NOTE — Telephone Encounter (Signed)
Attempted to contact pt. No answer, no vm.

## 2014-12-02 NOTE — Telephone Encounter (Signed)
Front desk attempted to contact pt to sched appt.  She reported that she is on bed rest and cannot come in for appt.   Spoke w/ pt's daughter.  Advised her of Dr. Donivan Scull recommendation.  She reports that pt has been taking metoprolol succinate 100 mg once daily w/ no complaints.  Advised her that I can send in a 23-monthsupply, but pt will need to be seen, as she is overdue and has not followed up from the hospital. She states that she will call when pt's schedule will accommodate.   Not[facility] [email or web address] change.

## 2014-12-05 NOTE — Telephone Encounter (Signed)
Called pt to schedule appt, states she will call us when she is recovered from her broken back

## 2014-12-31 ENCOUNTER — Other Ambulatory Visit: Payer: Self-pay | Admitting: Cardiovascular Disease

## 2014-12-31 ENCOUNTER — Telehealth: Payer: Self-pay | Admitting: Cardiovascular Disease

## 2014-12-31 NOTE — Telephone Encounter (Signed)
Per recall phone note patient broke back and will call at later date to schedule fu.  Deleting recall.

## 2015-01-26 ENCOUNTER — Telehealth: Payer: Self-pay

## 2015-01-26 NOTE — Telephone Encounter (Signed)
Received records request Disability Determination Services, forwarded to [employer] for processing.  

## 2015-02-12 ENCOUNTER — Telehealth: Payer: Self-pay | Admitting: *Deleted

## 2015-02-12 NOTE — Telephone Encounter (Signed)
Received records request from Disability Determination services , forwarded to [facility] for processing 02/12/15.

## 2015-02-17 DIAGNOSIS — S22000D Wedge compression fracture of unspecified thoracic vertebra, subsequent encounter for fracture with routine healing: Secondary | ICD-10-CM | POA: Diagnosis not present

## 2015-02-20 DIAGNOSIS — S22000D Wedge compression fracture of unspecified thoracic vertebra, subsequent encounter for fracture with routine healing: Secondary | ICD-10-CM | POA: Diagnosis not present

## 2015-02-23 ENCOUNTER — Ambulatory Visit: Payer: 59 | Admitting: Pain Medicine

## 2015-03-05 DIAGNOSIS — S22000D Wedge compression fracture of unspecified thoracic vertebra, subsequent encounter for fracture with routine healing: Secondary | ICD-10-CM | POA: Diagnosis not present

## 2015-04-06 ENCOUNTER — Other Ambulatory Visit: Payer: Self-pay | Admitting: Cardiovascular Disease

## 2015-08-28 ENCOUNTER — Other Ambulatory Visit: Payer: Self-pay | Admitting: Cardiovascular Disease

## 2015-08-28 NOTE — Telephone Encounter (Signed)
Refill sent for metoprolol.  

## 2015-10-05 ENCOUNTER — Other Ambulatory Visit: Payer: Self-pay | Admitting: Cardiovascular Disease

## 2015-10-21 ENCOUNTER — Telehealth: Payer: Self-pay | Admitting: Cardiovascular Disease

## 2015-10-21 NOTE — Telephone Encounter (Signed)
Lmov for patient to call back and schedule an appointment for refills.

## 2015-10-21 NOTE — Telephone Encounter (Signed)
-----   Message from Anselm Pancoast, [place] sent at 10/05/2015  9:07 AM EDT ----- Patient needs a follow up appointment before she gets a refill. Please contact patient for an appt.  Thanks, Ivin Booty

## 2015-10-22 NOTE — Telephone Encounter (Signed)
Lmov for patient to call back and schedule an appointment for Refills.

## 2015-10-30 NOTE — Telephone Encounter (Signed)
Lmov for patient to call back and schedule an appointment for Refills.

## 2016-02-23 DIAGNOSIS — M48061 Spinal stenosis, lumbar region without neurogenic claudication: Secondary | ICD-10-CM | POA: Diagnosis not present

## 2016-02-23 DIAGNOSIS — S22000D Wedge compression fracture of unspecified thoracic vertebra, subsequent encounter for fracture with routine healing: Secondary | ICD-10-CM | POA: Diagnosis not present

## 2016-02-25 ENCOUNTER — Other Ambulatory Visit: Payer: Self-pay | Admitting: Neurosurgery

## 2016-02-25 DIAGNOSIS — S22000D Wedge compression fracture of unspecified thoracic vertebra, subsequent encounter for fracture with routine healing: Secondary | ICD-10-CM

## 2016-03-04 ENCOUNTER — Other Ambulatory Visit: Payer: Self-pay | Admitting: Neurosurgery

## 2016-03-04 DIAGNOSIS — M48061 Spinal stenosis, lumbar region without neurogenic claudication: Secondary | ICD-10-CM

## 2016-03-07 ENCOUNTER — Ambulatory Visit: Payer: 59

## 2016-03-14 ENCOUNTER — Ambulatory Visit
Admission: RE | Admit: 2016-03-14 | Discharge: 2016-03-14 | Disposition: A | Discharge status: Home / Self Care | Payer: Medicare Other | Source: Ambulatory Visit | Attending: Neurosurgery | Admitting: Neurosurgery

## 2016-03-14 DIAGNOSIS — X58XXXD Exposure to other specified factors, subsequent encounter: Secondary | ICD-10-CM | POA: Insufficient documentation

## 2016-03-14 DIAGNOSIS — S22000D Wedge compression fracture of unspecified thoracic vertebra, subsequent encounter for fracture with routine healing: Secondary | ICD-10-CM

## 2016-03-14 DIAGNOSIS — M40204 Unspecified kyphosis, thoracic region: Secondary | ICD-10-CM | POA: Diagnosis not present

## 2016-03-14 DIAGNOSIS — M48061 Spinal stenosis, lumbar region without neurogenic claudication: Secondary | ICD-10-CM | POA: Diagnosis not present

## 2016-03-14 DIAGNOSIS — M5124 Other intervertebral disc displacement, thoracic region: Secondary | ICD-10-CM | POA: Diagnosis not present

## 2016-03-29 DIAGNOSIS — M48061 Spinal stenosis, lumbar region without neurogenic claudication: Secondary | ICD-10-CM | POA: Diagnosis not present

## 2016-05-06 ENCOUNTER — Emergency Department
Admission: EM | Admit: 2016-05-06 | Discharge: 2016-05-06 | Disposition: A | Discharge status: Home / Self Care | Payer: Medicare Other | Attending: Emergency Medicine | Admitting: Emergency Medicine

## 2016-05-06 ENCOUNTER — Encounter: Payer: Self-pay | Admitting: Emergency Medicine

## 2016-05-06 DIAGNOSIS — I5023 Acute on chronic systolic (congestive) heart failure: Secondary | ICD-10-CM | POA: Insufficient documentation

## 2016-05-06 DIAGNOSIS — M545 Low back pain, unspecified: Secondary | ICD-10-CM

## 2016-05-06 DIAGNOSIS — Z7982 Long term (current) use of aspirin: Secondary | ICD-10-CM | POA: Insufficient documentation

## 2016-05-06 DIAGNOSIS — Z87891 Personal history of nicotine dependence: Secondary | ICD-10-CM | POA: Diagnosis not present

## 2016-05-06 DIAGNOSIS — J45909 Unspecified asthma, uncomplicated: Secondary | ICD-10-CM | POA: Diagnosis not present

## 2016-05-06 DIAGNOSIS — I110 Hypertensive heart disease with heart failure: Secondary | ICD-10-CM | POA: Diagnosis not present

## 2016-05-06 DIAGNOSIS — M549 Dorsalgia, unspecified: Secondary | ICD-10-CM | POA: Diagnosis not present

## 2016-05-06 DIAGNOSIS — Z79899 Other long term (current) drug therapy: Secondary | ICD-10-CM | POA: Insufficient documentation

## 2016-05-06 DIAGNOSIS — J441 Chronic obstructive pulmonary disease with (acute) exacerbation: Secondary | ICD-10-CM | POA: Insufficient documentation

## 2016-05-06 MED ORDER — OXYCODONE-ACETAMINOPHEN 10-325 MG PO TABS: 1.0000 | ORAL_TABLET | Freq: Four times a day (QID) | ORAL | 0 refills | Status: DC | PRN

## 2016-05-06 MED ORDER — OXYCODONE-ACETAMINOPHEN 5-325 MG PO TABS
2.0000 | ORAL_TABLET | Freq: Once | ORAL | Status: AC
  Administered 2016-05-06: 2 via ORAL
  Filled 2016-05-06: qty 2

## 2016-05-06 NOTE — ED Provider Notes (Signed)
[facility] Emergency Department Provider Note  ____________________________________________   First MD Initiated Contact with Patient 05/06/16 1116     (approximate)  I have reviewed the triage vital signs and the nursing notes.   HISTORY  Chief Complaint Back Pain   HPI Grace [name] is a 66 y.o. female with a history of chronic back pain on Percocet as well as benzodiazepines at home was presenting to the emergency department today with left-sided low back pain. She says that she was supposed to have an epidural injection this past Monday but the procedure was canceled because of snow. She says that she also run out of her oxycodone as of yesterday. She says that she is normally prescribed 90 on the fifth of every month but she has been having increased back pain and so was taking more than normal. She says the pain when she is sitting still is a 2 but worsens to a 6-7 with movements. She says that the pain started last night when she shifted and felt sudden onset pain in the left lower back. She says the pain is not radiating at this time. However radiates down to the left calf when she moves her left lower extremity. She says that she is also having left lower extremity weakness secondarily to the pain. Denies any loss of bowel or bladder control.Says that normally the transitions herself from her bed to a wheelchair and does not walk on her own.   Past Medical History:  Diagnosis Date  . Asthma   . Cardiomyopathy ([place])    nonischemic (EF 35-40%)  . COPD (chronic obstructive pulmonary disease) ([place])   . Hyperlipidemia   . Hypertension   . PVC's (premature ventricular contractions)     Patient Active Problem List   Diagnosis Date Noted  . COPD exacerbation ([place])   . Systolic dysfunction with acute on chronic heart failure ([place])   . CHF (congestive heart failure), NYHA class I ([place]) 08/22/2014  . Thoracic compression fracture ([place]) 07/15/2014  .  Sepsis ([place]) 06/30/2014  . CAP (community acquired pneumonia) 06/30/2014  . GERD (gastroesophageal reflux disease) 06/30/2014  . Depression 06/30/2014  . PVC's (premature ventricular contractions) 05/08/2014  . SOB (shortness of breath) 05/08/2014  . COPD (chronic obstructive pulmonary disease) with chronic bronchitis ([place]) 05/08/2014  . Obesity 05/08/2014  . Tachycardia 05/08/2014  . 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 APPENDECTOMY    . CARDIAC CATHETERIZATION    . CATARACT EXTRACTION     right eye   . CESAREAN SECTION      Prior to Admission medications   Medication Sig Start Date End Date Taking? Authorizing Provider  albuterol (PROVENTIL HFA;VENTOLIN HFA) 108 (90 BASE) MCG/ACT inhaler Inhale 2 puffs into the lungs every 4 (four) hours as needed for shortness of breath.  12/23/13   Historical Provider, MD  ARIPiprazole (ABILIFY) 5 MG tablet Take 5 mg by mouth daily.    Historical Provider, MD  aspirin 325 MG tablet Take 325 mg by mouth daily.     Historical Provider, MD  DULoxetine (CYMBALTA) 60 MG capsule Take 60 mg by mouth daily.    Historical Provider, MD  furosemide (LASIX) 20 MG tablet Take 1 tablet (20 mg total) by mouth daily. 08/24/14   Vaughan Basta, MD  gabapentin (NEURONTIN) 300 MG capsule Take 1 capsule (300 mg total) by mouth 4 (four) times daily as needed (pain). Patient taking differently:  Take 300 mg by mouth 3 (three) times daily as needed (pain).  07/17/14   Earnie Larsson, MD  Nyoka Cowden Tea, Camillia sinensis, (GREEN TEA EXTRACT PO) Take 1 each by mouth daily.    Historical Provider, MD  LORazepam (ATIVAN) 0.5 MG tablet Take 0.5 mg by mouth every 8 (eight) hours as needed for anxiety.    Historical Provider, MD  losartan (COZAAR) 25 MG tablet Take 1 tablet (25 mg total) by mouth daily. 08/24/14   Vaughan Basta, MD  methocarbamol (ROBAXIN) 500 MG tablet Take 1 tablet (500 mg total) by  mouth every 6 (six) hours as needed for muscle spasms. 07/02/14   Epifanio Lesches, MD  metoprolol succinate (TOPROL-XL) 100 MG 24 hr tablet TAKE ONE TABLET BY MOUTH ONCE DAILY 08/28/15   Minna Merritts, MD  Misc Natural Products (CURCUMAX PRO PO) Take 1 tablet by mouth daily.    Historical Provider, MD  modafinil (PROVIGIL) 200 MG tablet Take 200 mg by mouth daily as needed (work).     Historical Provider, MD  Omega-3 Fatty Acids (FISH OIL) 1200 MG CAPS Take 1 capsule by mouth daily.     Historical Provider, MD  ondansetron (ZOFRAN-ODT) 4 MG disintegrating tablet Take 4 mg by mouth every 8 (eight) hours as needed for nausea or vomiting.    Historical Provider, MD  oxyCODONE-acetaminophen (PERCOCET) 10-325 MG per tablet Take 1 tablet by mouth every 6 (six) hours as needed for pain. 08/24/14   Vaughan Basta, MD  pantoprazole (PROTONIX) 40 MG tablet Take 40 mg by mouth daily.    Historical Provider, MD  varenicline (CHANTIX) 1 MG tablet Take 1 mg by mouth 2 (two) times daily.    Historical Provider, MD    Allergies Iodine  Family History  Problem Relation Age of Onset  . Heart attack Father 49    MI x 3     Social History Social History  Substance Use Topics  . Smoking status: Former Smoker    Packs/day: 0.50    Years: 50.00    Types: Cigarettes  . Smokeless tobacco: Never Used  . Alcohol use No    Review of Systems Constitutional: No fever/chills Eyes: No visual changes. ENT: No sore throat. Cardiovascular: Denies chest pain. Respiratory: Denies shortness of breath. Gastrointestinal: No abdominal pain.  No nausea, no vomiting.  No diarrhea.  No constipation. Genitourinary: Negative for dysuria. Musculoskeletal:as above Skin: Negative for rash. Neurological: Negative for headaches, focal weakness or numbness.  10-point ROS otherwise negative.  ____________________________________________   PHYSICAL EXAM:  VITAL SIGNS: ED Triage Vitals [05/06/16 1118]  Enc  Vitals Group     BP 134/75     Pulse Rate 88     Resp 18     Temp 98 F (36.7 C)     Temp Source Oral     SpO2 96 %     Weight 180 lb (81.6 kg)     Height '5\' 5"'$  (1.651 m)     Head Circumference      Peak Flow      Pain Score 4     Pain Loc      Pain Edu?      Excl. in [place]?     Constitutional: Alert and oriented. Well appearing and in no acute distress. Eyes: Conjunctivae are normal. PERRL. EOMI. Head: Atraumatic. Nose: No congestion/rhinnorhea. Mouth/Throat: Mucous membranes are moist.  Oropharynx non-erythematous. Neck: No stridor.   Cardiovascular: Normal rate, regular rhythm. Grossly normal heart sounds.  Good  peripheral circulation. Respiratory: Normal respiratory effort.  No retractions. Lungs CTAB. Gastrointestinal: Soft and nontender. No distention. No abdominal bruits. No CVA tenderness. Musculoskeletal:   Tenderness to the left lower lumbar region. Tenderness is mild. No deformity or step-off. Patient with 4 out of 5 strength left lower extremity with 5 out of 5 strength the right lower extremity. She says it is painful to the posterior of her left thigh and calf when she lifts her left lower extremity. No saddle anesthesia. Vascularly intact with bilateral and equal dorsalis pedis pulses.  Neurologic:  Normal speech and language. No gross focal neurologic deficits are appreciated. No gait instability. Skin:  Skin is warm, dry and intact. No rash noted. Psychiatric: Mood and affect are normal. Speech and behavior are normal.  ____________________________________________   LABS (all labs ordered are listed, but only abnormal results are displayed)  Labs Reviewed - No data to display ____________________________________________  EKG   ____________________________________________  RADIOLOGY   ____________________________________________   PROCEDURES  Procedure(s) performed:   Procedures  Critical Care performed:    ____________________________________________   INITIAL IMPRESSION / ASSESSMENT AND PLAN / ED COURSE  Pertinent labs & imaging results that were available during my care of the patient were reviewed by me and considered in my medical decision making (see chart for details).  ----------------------------------------- 12:37 PM on 05/06/2016 -----------------------------------------  Patient pain-free after 10 mg of Percocet. I reviewed her controlled substance registry and she did indeed have a prescription for 90 pills filled on March 5. She does not appear to have access prescriptions besides her monthly indications. She admitted to taking more than normal pain medications at home and also says that she has discussed this with her neurosurgeon, Dr. Trenton Gammon. She has been very forthcoming about her use of opiate narcotics for her pain at home and I believe that I can trust her with a short course of medication. She has now 5 out of 5 strength to the left lower extremity. Completely neurovascularly intact. She says that she has a make up appointment this Tuesday for Mr. Bowman last Monday because of the inclement weather. I will discharge her with a short course of Percocet. She is understanding of the plan and willing to comply. No signs of cord compression.      ____________________________________________   FINAL CLINICAL IMPRESSION(S) / ED DIAGNOSES  Low back pain.    NEW MEDICATIONS STARTED DURING THIS VISIT:  New Prescriptions   No medications on file     Note:  This document was prepared using Dragon voice recognition software and may include unintentional dictation errors.    Orbie Pyo, MD 05/06/16 [phone]

## 2016-05-06 NOTE — ED Triage Notes (Signed)
Presents via ems from home with complaints of back pain  Hx of chronic back pain  Was scheduled for injection on Monday but missed appt

## 2016-05-06 NOTE — ED Notes (Addendum)
See triage note  Has a hx of chronic back pain  States she is not ambulatory but uses a w/c  Missed injection on Monday  States this pain became more severe yesterday sudden onset

## 2016-05-10 DIAGNOSIS — M48061 Spinal stenosis, lumbar region without neurogenic claudication: Secondary | ICD-10-CM | POA: Diagnosis not present

## 2016-05-30 DIAGNOSIS — J449 Chronic obstructive pulmonary disease, unspecified: Secondary | ICD-10-CM | POA: Diagnosis not present

## 2016-05-30 DIAGNOSIS — M810 Age-related osteoporosis without current pathological fracture: Secondary | ICD-10-CM | POA: Diagnosis not present

## 2016-05-30 DIAGNOSIS — B353 Tinea pedis: Secondary | ICD-10-CM | POA: Diagnosis not present

## 2016-05-30 DIAGNOSIS — R3915 Urgency of urination: Secondary | ICD-10-CM | POA: Diagnosis not present

## 2016-05-30 DIAGNOSIS — F419 Anxiety disorder, unspecified: Secondary | ICD-10-CM | POA: Diagnosis not present

## 2016-05-30 DIAGNOSIS — F410 Panic disorder [episodic paroxysmal anxiety] without agoraphobia: Secondary | ICD-10-CM | POA: Diagnosis not present

## 2016-05-30 DIAGNOSIS — F17209 Nicotine dependence, unspecified, with unspecified nicotine-induced disorders: Secondary | ICD-10-CM | POA: Diagnosis not present

## 2016-05-31 DIAGNOSIS — M48061 Spinal stenosis, lumbar region without neurogenic claudication: Secondary | ICD-10-CM | POA: Diagnosis not present

## 2016-06-28 ENCOUNTER — Other Ambulatory Visit: Payer: Self-pay | Admitting: Neurosurgery

## 2016-06-28 DIAGNOSIS — M48061 Spinal stenosis, lumbar region without neurogenic claudication: Secondary | ICD-10-CM | POA: Diagnosis not present

## 2016-06-28 DIAGNOSIS — S22000D Wedge compression fracture of unspecified thoracic vertebra, subsequent encounter for fracture with routine healing: Secondary | ICD-10-CM | POA: Diagnosis not present

## 2016-07-01 ENCOUNTER — Telehealth: Payer: Self-pay | Admitting: Cardiovascular Disease

## 2016-07-01 NOTE — Telephone Encounter (Signed)
Received cardiac clearance request for pt to proceed w/ L4-5 lumbar laminectomy on 08/01/16 w/ Dr. Earnie Larsson. Pt saw Dr. Rockey Situ once in 2016, has not scheduled any f/u appts despite multiple attempts to contact her.  Will route this info back to [place] @ [phone].

## 2016-07-23 NOTE — Progress Notes (Signed)
Cardiology Office Note  Date:  07/25/2016   ID:  Grace [name], DOB [birth year], MRN [id number]  PCP:  [name], MD   Chief Complaint  Patient presents with  . other    Surgeral Clearence. Patient denies chest pain. Meds reviewed verbally with patient.     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 Earnie Larsson. [facility]  In the ER 05/06/16 for back pain  Denies having any significant leg swelling, weight gain She is not on Lasix She is having periodic tachycardia, etiology unclear Reports one morning she woke up and heart rate was 120 bpm Seems to come and go, no trigger Compliant with her medications  having left lower extremity weakness   Denies any loss of bowel or bladder control.  normally transitions herself from her bed to a wheelchair, does not walk on her own.  Previous histories of bronchitis  November 2015, again February 2016 both treated with antibiotics and prednisone.  She stopped smoking 2 years ago Would like to go back on Chantix but it is too expensive  EKG on today's visit shows normal sinus rhythm with rate 64 bpm, nonspecific ST abnormality   PMH:   has a past medical history of Asthma; Cardiomyopathy ([place]); COPD (chronic obstructive pulmonary disease) ([place]); Hyperlipidemia; Hypertension; and PVC's (premature ventricular contractions).  PSH:    Past Surgical History:  Procedure Laterality Date  . APPENDECTOMY    . CARDIAC CATHETERIZATION    . CATARACT EXTRACTION     right eye   . CESAREAN SECTION      Current Outpatient Prescriptions  Medication Sig Dispense Refill  . albuterol (PROVENTIL HFA;VENTOLIN HFA) 108 (90  BASE) MCG/ACT inhaler Inhale 2 puffs into the lungs every 6 (six) hours as needed for shortness of breath.     Marland Kitchen alendronate (FOSAMAX) 70 MG tablet Take 70 mg by mouth every Monday.    . ARIPiprazole (ABILIFY) 5 MG tablet Take 5 mg by mouth daily.    . DULoxetine (CYMBALTA) 60 MG capsule Take 60 mg by mouth daily.    Marland Kitchen ibuprofen (ADVIL,MOTRIN) 200 MG tablet Take 800 mg by mouth every 8 (eight) hours as needed (for pain.).    Marland Kitchen LORazepam (ATIVAN) 0.5 MG tablet Take 0.5 mg by mouth daily as needed for anxiety.     Marland Kitchen losartan (COZAAR) 25 MG tablet Take 1 tablet (25 mg total) by mouth daily. 30 tablet 0  . metoprolol succinate (TOPROL-XL) 100 MG 24 hr tablet TAKE ONE TABLET BY MOUTH ONCE DAILY 30 tablet 0  . modafinil (PROVIGIL) 200 MG tablet Take 200 mg by mouth daily as needed (for alertness 3rd shift).     Marland Kitchen oxyCODONE-acetaminophen (PERCOCET) 10-325 MG tablet Take 1 tablet by mouth every 6 (six) hours as needed for pain. 10 tablet 0  . oxyCODONE-acetaminophen (PERCOCET/ROXICET) 5-325 MG tablet Take 1 tablet by mouth every 6 (six) hours as needed. For pain.  0   No current facility-administered medications for this visit.      Allergies:   Iodine   Social History:  The patient  reports that she has quit smoking. Her smoking use included Cigarettes. She has a 25.00 pack-year smoking history. She has never used  smokeless tobacco. She reports that she does not drink alcohol or use drugs.   Family History:   family history includes Heart attack (age of onset: 67) in her father.    Review of Systems: Review of Systems  Constitutional: Negative.   Respiratory: Negative.   Cardiovascular: Negative.   Gastrointestinal: Negative.   Musculoskeletal: Negative.   Neurological: Negative.   Psychiatric/Behavioral: Negative.   All other systems reviewed and are negative.    PHYSICAL EXAM: VS:  BP 138/72 (BP Location: Left Arm, Patient Position: Sitting, Cuff Size: Normal)   Pulse 64   Ht 5\' 5"   (1.651 m)   Wt 234 lb 8 oz (106.4 kg)   BMI 39.02 kg/m  , BMI Body mass index is 39.02 kg/m. GEN: Well nourished, well developed, in no acute distress  HEENT: normal  Neck: no JVD, carotid bruits, or masses Cardiac: RRR; no murmurs, rubs, or gallops,no edema  Respiratory:  clear to auscultation bilaterally, normal work of breathing GI: soft, nontender, nondistended, + BS MS: no deformity or atrophy  Skin: warm and dry, no rash Neuro:  Strength and sensation are intact Psych: euthymic mood, full affect    Recent Labs: No results found for requested labs within last 8760 hours.    Lipid Panel No results found for: CHOL, HDL, LDLCALC, TRIG    Wt Readings from Last 3 Encounters:  07/25/16 234 lb 8 oz (106.4 kg)  05/06/16 180 lb (81.6 kg)  08/23/14 178 lb 12.8 oz (81.1 kg)       ASSESSMENT AND PLAN:  Congestive dilated cardiomyopathy ([place]) - Plan: EKG 12-Lead Long discussion concerning her cardiac issues She has been stable for many years ejection fraction 35-40%, seen previously She is interested in starting entresto. We will hold the losartan and start entresto 49/51 mg twice a day BMP, LFTs in one month  SOB (shortness of breath) - Plan: EKG 12-Lead Appears euvolemic, Sedentary at baseline secondary to her chronic back issues  COPD (chronic obstructive pulmonary disease) with chronic bronchitis (HCC) - Plan: EKG 12-Lead She stopped smoking 2 years ago Reports her breathing is stable  Closed compression fracture of thoracic vertebra, sequela - Plan: EKG 12-Lead Scheduled for surgery next Monday with Dr. Virginia Crews be acceptable risk for surgery, no further testing needed  Disposition:   F/U  3 months   Total encounter time more than 25 minutes  Greater than 50% was spent in counseling and coordination of care with the patient    Orders Placed This Encounter  Procedures  . EKG 12-Lead     Signed, Esmond Plants, M.D., Ph.D. 07/25/2016  [place], [place]

## 2016-07-25 ENCOUNTER — Encounter: Payer: Self-pay | Admitting: Cardiovascular Disease

## 2016-07-25 ENCOUNTER — Ambulatory Visit (INDEPENDENT_AMBULATORY_CARE_PROVIDER_SITE_OTHER): Payer: Medicare Other | Admitting: Cardiovascular Disease

## 2016-07-25 ENCOUNTER — Encounter (HOSPITAL_COMMUNITY)
Admission: RE | Admit: 2016-07-25 | Discharge: 2016-07-25 | Disposition: A | Discharge status: Home / Self Care | Payer: Medicare Other | Source: Ambulatory Visit | Attending: Neurosurgery | Admitting: Neurosurgery

## 2016-07-25 ENCOUNTER — Encounter (HOSPITAL_COMMUNITY): Payer: Self-pay

## 2016-07-25 VITALS — BP 138/72 | HR 64 | Ht 65.0 in | Wt 234.5 lb

## 2016-07-25 DIAGNOSIS — Z01812 Encounter for preprocedural laboratory examination: Secondary | ICD-10-CM | POA: Diagnosis not present

## 2016-07-25 DIAGNOSIS — R0602 Shortness of breath: Secondary | ICD-10-CM | POA: Diagnosis not present

## 2016-07-25 DIAGNOSIS — I420 Dilated cardiomyopathy: Secondary | ICD-10-CM

## 2016-07-25 DIAGNOSIS — Z87891 Personal history of nicotine dependence: Secondary | ICD-10-CM | POA: Diagnosis not present

## 2016-07-25 DIAGNOSIS — Z9889 Other specified postprocedural states: Secondary | ICD-10-CM | POA: Diagnosis not present

## 2016-07-25 DIAGNOSIS — Z79899 Other long term (current) drug therapy: Secondary | ICD-10-CM | POA: Diagnosis not present

## 2016-07-25 DIAGNOSIS — J449 Chronic obstructive pulmonary disease, unspecified: Secondary | ICD-10-CM

## 2016-07-25 DIAGNOSIS — E785 Hyperlipidemia, unspecified: Secondary | ICD-10-CM | POA: Insufficient documentation

## 2016-07-25 DIAGNOSIS — Z888 Allergy status to other drugs, medicaments and biological substances status: Secondary | ICD-10-CM | POA: Diagnosis not present

## 2016-07-25 DIAGNOSIS — I10 Essential (primary) hypertension: Secondary | ICD-10-CM | POA: Insufficient documentation

## 2016-07-25 DIAGNOSIS — S22000S Wedge compression fracture of unspecified thoracic vertebra, sequela: Secondary | ICD-10-CM | POA: Diagnosis not present

## 2016-07-25 HISTORY — DX: Anxiety disorder, unspecified: F41.9

## 2016-07-25 HISTORY — DX: Pneumonia, unspecified organism: J18.9

## 2016-07-25 HISTORY — DX: Unspecified osteoarthritis, unspecified site: M19.90

## 2016-07-25 HISTORY — DX: Gastro-esophageal reflux disease without esophagitis: K21.9

## 2016-07-25 HISTORY — DX: Depression, unspecified: F32.A

## 2016-07-25 HISTORY — DX: Major depressive disorder, single episode, unspecified: F32.9

## 2016-07-25 HISTORY — DX: Dyspnea, unspecified: R06.00

## 2016-07-25 LAB — CBC WITH DIFFERENTIAL/PLATELET
BASOS PCT: 1 %
Basophils Absolute: 0.1 10*3/uL (ref 0.0–0.1)
Eosinophils Absolute: 0.1 10*3/uL (ref 0.0–0.7)
Eosinophils Relative: 1 %
HEMATOCRIT: 42.7 % (ref 36.0–46.0)
Hemoglobin: 13.9 g/dL (ref 12.0–15.0)
LYMPHS ABS: 2 10*3/uL (ref 0.7–4.0)
Lymphocytes Relative: 17 %
MCH: 30.5 pg (ref 26.0–34.0)
MCHC: 32.6 g/dL (ref 30.0–36.0)
MCV: 93.8 fL (ref 78.0–100.0)
MONO ABS: 0.5 10*3/uL (ref 0.1–1.0)
MONOS PCT: 4 %
NEUTROS ABS: 8.9 10*3/uL — AB (ref 1.7–7.7)
Neutrophils Relative %: 77 %
Platelets: 237 10*3/uL (ref 150–400)
RBC: 4.55 MIL/uL (ref 3.87–5.11)
RDW: 13.6 % (ref 11.5–15.5)
WBC: 11.6 10*3/uL — ABNORMAL HIGH (ref 4.0–10.5)

## 2016-07-25 LAB — BASIC METABOLIC PANEL
ANION GAP: 11 (ref 5–15)
BUN: 12 mg/dL (ref 6–20)
CALCIUM: 9.2 mg/dL (ref 8.9–10.3)
CO2: 22 mmol/L (ref 22–32)
CREATININE: 0.71 mg/dL (ref 0.44–1.00)
Chloride: 104 mmol/L (ref 101–111)
GFR calc Af Amer: >60 mL/min (ref >60)
GFR calc non Af Amer: >60 mL/min (ref >60)
GLUCOSE: 123 mg/dL — AB (ref 65–99)
Potassium: 4 mmol/L (ref 3.5–5.1)
Sodium: 137 mmol/L (ref 135–145)

## 2016-07-25 LAB — SURGICAL PCR SCREEN
MRSA, PCR: NEGATIVE
STAPHYLOCOCCUS AUREUS: NEGATIVE

## 2016-07-25 MED ORDER — DILTIAZEM HCL 30 MG PO TABS: 30.0000 mg | ORAL_TABLET | Freq: Three times a day (TID) | ORAL | 3 refills | Status: DC | PRN

## 2016-07-25 MED ORDER — SACUBITRIL-VALSARTAN 49-51 MG PO TABS: 1.0000 | ORAL_TABLET | Freq: Two times a day (BID) | ORAL | 6 refills | Status: DC

## 2016-07-25 NOTE — Progress Notes (Addendum)
PCP is Dr. [name] Cardiologist is Dr Rockey Situ she saw 07-25-16 for clearance- clearance on chart Denies ever having a stress test. Echo noted from 07-16-14 Denies any chest pain, cough, or fever.  Ekg done today at Dr Donivan Scull office waiting to be read.  States she had a card cath 10 years ago at [facility] With Dr Manfred Arch

## 2016-07-25 NOTE — Pre-Procedure Instructions (Signed)
CIELLE AGUILA  07/25/2016      [place], [address] [place] [address] Phone: [phone] Fax: [phone]  [id number] - [place], [place] - [address] [place] [address] Phone: [phone] Fax: [phone]    Your procedure is scheduled on June 18  Report to [place] at 600 A.M.  Call this number if you have problems the morning of surgery:  [phone]   Remember:  Do not eat food or drink liquids after midnight.  Take these medicines the morning of surgery with A SIP OF WATER albuterol inhaler if needed, Aripiprazole (Abilify), diltiazem (Cardizem) if needed, Duloxetine (Cymbalta), Lorazepam (Ativan) if needed, Metoprolol succinate (Tpprol-XL), Oxycodone (Percocet) If needed  Stop taking aspirin, BC's, Goody's, Herbal medications, Fish Oil, Vitamins, Aleve, Ibuprofen, Advil, Motrin   Do not wear jewelry, make-up or nail polish.  Do not wear lotions, powders, or perfumes, or deoderant.  Do not shave 48 hours prior to surgery.  Men may shave face and neck.  Do not bring valuables to the hospital.  [facility] is not responsible for any belongings or valuables.  Contacts, dentures or bridgework may not be worn into surgery.  Leave your suitcase in the car.  After surgery it may be brought to your room.  For patients admitted to the hospital, discharge time will be determined by your treatment team.  Patients discharged the day of surgery will not be allowed to drive home.    Special instructions:  [place] - Preparing for Surgery  Before surgery, you can play an important role.  Because skin is not sterile, your skin needs to be as free of germs as possible.  You can reduce the number of germs on you skin by washing with CHG (chlorahexidine gluconate) soap before surgery.  CHG is an antiseptic cleaner which kills germs and bonds with  the skin to continue killing germs even after washing.  Please DO NOT use if you have an allergy to CHG or antibacterial soaps.  If your skin becomes reddened/irritated stop using the CHG and inform your nurse when you arrive at Short Stay.  Do not shave (including legs and underarms) for at least 48 hours prior to the first CHG shower.  You may shave your face.  Please follow these instructions carefully:   1.  Shower with CHG Soap the night before surgery and the                                morning of Surgery.  2.  If you choose to wash your hair, wash your hair first as usual with your       normal shampoo.  3.  After you shampoo, rinse your hair and body thoroughly to remove the                      Shampoo.  4.  Use CHG as you would any other liquid soap.  You can apply chg directly       to the skin and wash gently with scrungie or a clean washcloth.  5.  Apply the CHG Soap to your body ONLY FROM THE NECK DOWN.        Do not use on open wounds or open sores.  Avoid contact with your eyes,  ears, mouth and genitals (private parts).  Wash genitals (private parts)       with your normal soap.  6.  Wash thoroughly, paying special attention to the area where your surgery        will be performed.  7.  Thoroughly rinse your body with warm water from the neck down.  8.  DO NOT shower/wash with your normal soap after using and rinsing off       the CHG Soap.  9.  Pat yourself dry with a clean towel.            10.  Wear clean pajamas.            11.  Place clean sheets on your bed the night of your first shower and do not        sleep with pets.  Day of Surgery  Do not apply any lotions/deoderants the morning of surgery.  Please wear clean clothes to the hospital/surgery center.     Please read over the following fact sheets that you were given. Pain Booklet, Coughing and Deep Breathing, MRSA Information and Surgical Site Infection Prevention

## 2016-07-25 NOTE — Patient Instructions (Addendum)
Medication Instructions:   Please take diltiazem 30 mg x 2 as needed for tachycardia   Please hold the losartan Start entresto 1/2 pill twice a day for one week Monitor blood pressure If tolerated, Increase up to a full pill twice a day  Labwork:  BMP in one month  Testing/Procedures:  Please call when you would like a monitor for tachycardia   I recommend watching educational videos on topics of interest to you at:       [email or web address]  Enter code: HEARTCARE    Follow-Up: It was a pleasure seeing you in the office today. Please call us if you have new issues that need to be addressed before your next appt.  [phone]  Your physician wants you to follow-up in: 3 months.  You will receive a reminder letter in the mail two months in advance. If you don't receive a letter, please call our office to schedule the follow-up appointment.  If you need a refill on your cardiac medications before your next appointment, please call your pharmacy.

## 2016-07-31 MED ORDER — DEXAMETHASONE SODIUM PHOSPHATE 10 MG/ML IJ SOLN
10.0000 mg | INTRAMUSCULAR | Status: AC
  Administered 2016-08-01: 10 mg via INTRAVENOUS
  Filled 2016-07-31: qty 1

## 2016-07-31 MED ORDER — CEFAZOLIN SODIUM-DEXTROSE 2-4 GM/100ML-% IV SOLN
2.0000 g | INTRAVENOUS | Status: AC
  Administered 2016-08-01: 2 g via INTRAVENOUS
  Filled 2016-07-31: qty 100

## 2016-08-01 ENCOUNTER — Ambulatory Visit (HOSPITAL_COMMUNITY): Payer: Medicare Other

## 2016-08-01 ENCOUNTER — Ambulatory Visit (HOSPITAL_COMMUNITY): Payer: Medicare Other | Admitting: Anesthesiology

## 2016-08-01 ENCOUNTER — Encounter (HOSPITAL_COMMUNITY)
Admission: RE | Disposition: A | Discharge status: Home / Self Care | Payer: Self-pay | Source: Ambulatory Visit | Attending: Neurosurgery

## 2016-08-01 ENCOUNTER — Encounter (HOSPITAL_COMMUNITY): Payer: Self-pay | Admitting: *Deleted

## 2016-08-01 ENCOUNTER — Observation Stay (HOSPITAL_COMMUNITY)
Admission: RE | Admit: 2016-08-01 | Discharge: 2016-08-01 | Disposition: A | Discharge status: Home / Self Care | Payer: Medicare Other | Source: Ambulatory Visit | Attending: Neurosurgery | Admitting: Neurosurgery

## 2016-08-01 DIAGNOSIS — I110 Hypertensive heart disease with heart failure: Secondary | ICD-10-CM | POA: Insufficient documentation

## 2016-08-01 DIAGNOSIS — Z87891 Personal history of nicotine dependence: Secondary | ICD-10-CM | POA: Diagnosis not present

## 2016-08-01 DIAGNOSIS — Z79899 Other long term (current) drug therapy: Secondary | ICD-10-CM | POA: Insufficient documentation

## 2016-08-01 DIAGNOSIS — Z91041 Radiographic dye allergy status: Secondary | ICD-10-CM | POA: Diagnosis not present

## 2016-08-01 DIAGNOSIS — F419 Anxiety disorder, unspecified: Secondary | ICD-10-CM | POA: Insufficient documentation

## 2016-08-01 DIAGNOSIS — M1990 Unspecified osteoarthritis, unspecified site: Secondary | ICD-10-CM | POA: Insufficient documentation

## 2016-08-01 DIAGNOSIS — M48062 Spinal stenosis, lumbar region with neurogenic claudication: Principal | ICD-10-CM | POA: Insufficient documentation

## 2016-08-01 DIAGNOSIS — M48061 Spinal stenosis, lumbar region without neurogenic claudication: Secondary | ICD-10-CM | POA: Diagnosis not present

## 2016-08-01 DIAGNOSIS — Z8249 Family history of ischemic heart disease and other diseases of the circulatory system: Secondary | ICD-10-CM | POA: Diagnosis not present

## 2016-08-01 DIAGNOSIS — J449 Chronic obstructive pulmonary disease, unspecified: Secondary | ICD-10-CM | POA: Insufficient documentation

## 2016-08-01 DIAGNOSIS — M9689 Other intraoperative and postprocedural complications and disorders of the musculoskeletal system: Secondary | ICD-10-CM | POA: Diagnosis not present

## 2016-08-01 DIAGNOSIS — E785 Hyperlipidemia, unspecified: Secondary | ICD-10-CM | POA: Insufficient documentation

## 2016-08-01 DIAGNOSIS — K219 Gastro-esophageal reflux disease without esophagitis: Secondary | ICD-10-CM | POA: Diagnosis not present

## 2016-08-01 DIAGNOSIS — I493 Ventricular premature depolarization: Secondary | ICD-10-CM | POA: Diagnosis not present

## 2016-08-01 DIAGNOSIS — I429 Cardiomyopathy, unspecified: Secondary | ICD-10-CM | POA: Diagnosis not present

## 2016-08-01 DIAGNOSIS — I509 Heart failure, unspecified: Secondary | ICD-10-CM | POA: Insufficient documentation

## 2016-08-01 DIAGNOSIS — I10 Essential (primary) hypertension: Secondary | ICD-10-CM | POA: Diagnosis not present

## 2016-08-01 DIAGNOSIS — Z419 Encounter for procedure for purposes other than remedying health state, unspecified: Secondary | ICD-10-CM

## 2016-08-01 HISTORY — PX: LUMBAR LAMINECTOMY/DECOMPRESSION MICRODISCECTOMY: SHX5026

## 2016-08-01 SURGERY — LUMBAR LAMINECTOMY/DECOMPRESSION MICRODISCECTOMY 1 LEVEL
Anesthesia: General | Site: Back | Laterality: Left

## 2016-08-01 MED ORDER — FENTANYL CITRATE (PF) 250 MCG/5ML IJ SOLN
INTRAMUSCULAR | Status: AC
  Filled 2016-08-01: qty 5

## 2016-08-01 MED ORDER — OXYCODONE-ACETAMINOPHEN 10-325 MG PO TABS: 1.0000 | ORAL_TABLET | ORAL | 0 refills | Status: DC | PRN

## 2016-08-01 MED ORDER — FENTANYL CITRATE (PF) 100 MCG/2ML IJ SOLN
INTRAMUSCULAR | Status: DC | PRN
  Administered 2016-08-01 (×2): 50 ug via INTRAVENOUS
  Administered 2016-08-01: 150 ug via INTRAVENOUS

## 2016-08-01 MED ORDER — CHLORHEXIDINE GLUCONATE CLOTH 2 % EX PADS: 6.0000 | MEDICATED_PAD | Freq: Once | CUTANEOUS | Status: DC

## 2016-08-01 MED ORDER — LACTATED RINGERS IV SOLN
INTRAVENOUS | Status: DC
  Administered 2016-08-01 (×2): via INTRAVENOUS

## 2016-08-01 MED ORDER — MIDAZOLAM HCL 5 MG/5ML IJ SOLN
INTRAMUSCULAR | Status: DC | PRN
  Administered 2016-08-01: 2 mg via INTRAVENOUS

## 2016-08-01 MED ORDER — DULOXETINE HCL 30 MG PO CPEP: 60.0000 mg | ORAL_CAPSULE | Freq: Every day | ORAL | Status: DC

## 2016-08-01 MED ORDER — LORAZEPAM 0.5 MG PO TABS: 0.5000 mg | ORAL_TABLET | Freq: Every day | ORAL | Status: DC | PRN

## 2016-08-01 MED ORDER — EPHEDRINE SULFATE 50 MG/ML IJ SOLN
INTRAMUSCULAR | Status: DC | PRN
  Administered 2016-08-01: 10 mg via INTRAVENOUS
  Administered 2016-08-01 (×2): 15 mg via INTRAVENOUS

## 2016-08-01 MED ORDER — DILTIAZEM HCL 30 MG PO TABS
30.0000 mg | ORAL_TABLET | Freq: Three times a day (TID) | ORAL | Status: DC | PRN
  Filled 2016-08-01: qty 1

## 2016-08-01 MED ORDER — ONDANSETRON HCL 4 MG/2ML IJ SOLN: 4.0000 mg | Freq: Four times a day (QID) | INTRAMUSCULAR | Status: DC | PRN

## 2016-08-01 MED ORDER — LIDOCAINE HCL (CARDIAC) 20 MG/ML IV SOLN
INTRAVENOUS | Status: DC | PRN
  Administered 2016-08-01: 100 mg via INTRAVENOUS

## 2016-08-01 MED ORDER — BUPIVACAINE HCL 0.25 % IJ SOLN
INTRAMUSCULAR | Status: DC | PRN
  Administered 2016-08-01: 20 mL

## 2016-08-01 MED ORDER — CYCLOBENZAPRINE HCL 10 MG PO TABS
10.0000 mg | ORAL_TABLET | Freq: Three times a day (TID) | ORAL | Status: DC | PRN
  Administered 2016-08-01: 10 mg via ORAL
  Filled 2016-08-01: qty 1

## 2016-08-01 MED ORDER — SACUBITRIL-VALSARTAN 49-51 MG PO TABS
1.0000 | ORAL_TABLET | Freq: Two times a day (BID) | ORAL | Status: DC
  Filled 2016-08-01: qty 1

## 2016-08-01 MED ORDER — FENTANYL CITRATE (PF) 100 MCG/2ML IJ SOLN: 25.0000 ug | INTRAMUSCULAR | Status: DC | PRN

## 2016-08-01 MED ORDER — METOPROLOL SUCCINATE ER 25 MG PO TB24: 100.0000 mg | ORAL_TABLET | Freq: Every day | ORAL | Status: DC

## 2016-08-01 MED ORDER — KETOROLAC TROMETHAMINE 15 MG/ML IJ SOLN
15.0000 mg | Freq: Four times a day (QID) | INTRAMUSCULAR | Status: DC
  Administered 2016-08-01: 15 mg via INTRAVENOUS
  Filled 2016-08-01: qty 1

## 2016-08-01 MED ORDER — PHENYLEPHRINE HCL 10 MG/ML IJ SOLN
INTRAVENOUS | Status: DC | PRN
  Administered 2016-08-01: 20 ug/min via INTRAVENOUS

## 2016-08-01 MED ORDER — THROMBIN 5000 UNITS EX SOLR
CUTANEOUS | Status: DC | PRN
  Administered 2016-08-01: 10000 [IU] via TOPICAL

## 2016-08-01 MED ORDER — HYDROCODONE-ACETAMINOPHEN 5-325 MG PO TABS: 1.0000 | ORAL_TABLET | ORAL | Status: DC | PRN

## 2016-08-01 MED ORDER — KETOROLAC TROMETHAMINE 30 MG/ML IJ SOLN
INTRAMUSCULAR | Status: DC | PRN
  Administered 2016-08-01: 30 mg via INTRAVENOUS

## 2016-08-01 MED ORDER — ONDANSETRON HCL 4 MG/2ML IJ SOLN
INTRAMUSCULAR | Status: DC | PRN
  Administered 2016-08-01: 4 mg via INTRAVENOUS

## 2016-08-01 MED ORDER — ONDANSETRON HCL 4 MG PO TABS: 4.0000 mg | ORAL_TABLET | Freq: Four times a day (QID) | ORAL | Status: DC | PRN

## 2016-08-01 MED ORDER — BUPIVACAINE HCL (PF) 0.25 % IJ SOLN
INTRAMUSCULAR | Status: AC
  Filled 2016-08-01: qty 30

## 2016-08-01 MED ORDER — ARIPIPRAZOLE 5 MG PO TABS: 5.0000 mg | ORAL_TABLET | Freq: Every day | ORAL | Status: DC

## 2016-08-01 MED ORDER — SUGAMMADEX SODIUM 200 MG/2ML IV SOLN
INTRAVENOUS | Status: DC | PRN
  Administered 2016-08-01: 200 mg via INTRAVENOUS

## 2016-08-01 MED ORDER — ROCURONIUM BROMIDE 10 MG/ML (PF) SYRINGE
PREFILLED_SYRINGE | INTRAVENOUS | Status: AC
  Filled 2016-08-01: qty 5

## 2016-08-01 MED ORDER — THROMBIN 5000 UNITS EX SOLR
CUTANEOUS | Status: AC
  Filled 2016-08-01: qty 10000

## 2016-08-01 MED ORDER — MENTHOL 3 MG MT LOZG: 1.0000 | LOZENGE | OROMUCOSAL | Status: DC | PRN

## 2016-08-01 MED ORDER — LIDOCAINE 2% (20 MG/ML) 5 ML SYRINGE
INTRAMUSCULAR | Status: AC
  Filled 2016-08-01: qty 5

## 2016-08-01 MED ORDER — CYCLOBENZAPRINE HCL 10 MG PO TABS: 10.0000 mg | ORAL_TABLET | Freq: Three times a day (TID) | ORAL | 0 refills | Status: DC | PRN

## 2016-08-01 MED ORDER — ALBUTEROL SULFATE (2.5 MG/3ML) 0.083% IN NEBU: 3.0000 mL | INHALATION_SOLUTION | Freq: Four times a day (QID) | RESPIRATORY_TRACT | Status: DC | PRN

## 2016-08-01 MED ORDER — 0.9 % SODIUM CHLORIDE (POUR BTL) OPTIME
TOPICAL | Status: DC | PRN
  Administered 2016-08-01: 1000 mL

## 2016-08-01 MED ORDER — SODIUM CHLORIDE 0.9% FLUSH: 3.0000 mL | INTRAVENOUS | Status: DC | PRN

## 2016-08-01 MED ORDER — SUCCINYLCHOLINE CHLORIDE 200 MG/10ML IV SOSY
PREFILLED_SYRINGE | INTRAVENOUS | Status: AC
  Filled 2016-08-01: qty 10

## 2016-08-01 MED ORDER — BACITRACIN 50000 UNITS IM SOLR
INTRAMUSCULAR | Status: DC | PRN
  Administered 2016-08-01: 500 mL

## 2016-08-01 MED ORDER — CEFAZOLIN SODIUM-DEXTROSE 1-4 GM/50ML-% IV SOLN
1.0000 g | Freq: Three times a day (TID) | INTRAVENOUS | Status: DC
  Administered 2016-08-01: 1 g via INTRAVENOUS
  Filled 2016-08-01: qty 50

## 2016-08-01 MED ORDER — PROPOFOL 10 MG/ML IV BOLUS
INTRAVENOUS | Status: DC | PRN
Start: 2016-08-01 — End: 2016-08-01
  Administered 2016-08-01: 100 mg via INTRAVENOUS

## 2016-08-01 MED ORDER — PHENOL 1.4 % MT LIQD: 1.0000 | OROMUCOSAL | Status: DC | PRN

## 2016-08-01 MED ORDER — ONDANSETRON HCL 4 MG/2ML IJ SOLN
INTRAMUSCULAR | Status: AC
  Filled 2016-08-01: qty 4

## 2016-08-01 MED ORDER — PROPOFOL 10 MG/ML IV BOLUS
INTRAVENOUS | Status: AC
  Filled 2016-08-01: qty 40

## 2016-08-01 MED ORDER — KETOROLAC TROMETHAMINE 15 MG/ML IJ SOLN
30.0000 mg | Freq: Four times a day (QID) | INTRAMUSCULAR | Status: DC
  Administered 2016-08-01: 30 mg via INTRAVENOUS
  Filled 2016-08-01: qty 2

## 2016-08-01 MED ORDER — OXYCODONE-ACETAMINOPHEN 10-325 MG PO TABS: 1.0000 | ORAL_TABLET | Freq: Four times a day (QID) | ORAL | Status: DC | PRN

## 2016-08-01 MED ORDER — SODIUM CHLORIDE 0.9% FLUSH
3.0000 mL | Freq: Two times a day (BID) | INTRAVENOUS | Status: DC
  Administered 2016-08-01: 3 mL via INTRAVENOUS

## 2016-08-01 MED ORDER — OXYCODONE HCL 5 MG PO TABS
5.0000 mg | ORAL_TABLET | Freq: Four times a day (QID) | ORAL | Status: DC | PRN
  Administered 2016-08-01: 5 mg via ORAL
  Filled 2016-08-01: qty 1

## 2016-08-01 MED ORDER — ROCURONIUM BROMIDE 100 MG/10ML IV SOLN
INTRAVENOUS | Status: DC | PRN
  Administered 2016-08-01: 60 mg via INTRAVENOUS

## 2016-08-01 MED ORDER — MIDAZOLAM HCL 2 MG/2ML IJ SOLN
INTRAMUSCULAR | Status: AC
  Filled 2016-08-01: qty 2

## 2016-08-01 MED ORDER — HYDROMORPHONE HCL 1 MG/ML IJ SOLN
0.5000 mg | INTRAMUSCULAR | Status: DC | PRN
  Administered 2016-08-01: 0.5 mg via INTRAVENOUS
  Filled 2016-08-01: qty 0.5

## 2016-08-01 MED ORDER — MODAFINIL 100 MG PO TABS: 200.0000 mg | ORAL_TABLET | Freq: Every day | ORAL | Status: DC | PRN

## 2016-08-01 MED ORDER — OXYCODONE-ACETAMINOPHEN 5-325 MG PO TABS
1.0000 | ORAL_TABLET | Freq: Four times a day (QID) | ORAL | Status: DC | PRN
  Administered 2016-08-01: 1 via ORAL
  Filled 2016-08-01: qty 1

## 2016-08-01 MED ORDER — DEXAMETHASONE SODIUM PHOSPHATE 10 MG/ML IJ SOLN
INTRAMUSCULAR | Status: AC
  Filled 2016-08-01: qty 2

## 2016-08-01 SURGICAL SUPPLY — 54 items
BAG DECANTER FOR FLEXI CONT (MISCELLANEOUS) ×3 IMPLANT
BENZOIN TINCTURE PRP APPL 2/3 (GAUZE/BANDAGES/DRESSINGS) ×3 IMPLANT
BLADE CLIPPER SURG (BLADE) IMPLANT
BUR CUTTER 7.0 ROUND (BURR) ×3 IMPLANT
CANISTER SUCT 3000ML PPV (MISCELLANEOUS) ×3 IMPLANT
CARTRIDGE OIL MAESTRO DRILL (MISCELLANEOUS) ×1 IMPLANT
CLOSURE WOUND 1/2 X4 (GAUZE/BANDAGES/DRESSINGS) ×2
DECANTER SPIKE VIAL GLASS SM (MISCELLANEOUS) IMPLANT
DERMABOND ADVANCED (GAUZE/BANDAGES/DRESSINGS) ×2
DERMABOND ADVANCED .7 DNX12 (GAUZE/BANDAGES/DRESSINGS) ×1 IMPLANT
DIFFUSER DRILL AIR PNEUMATIC (MISCELLANEOUS) ×3 IMPLANT
DRAPE HALF SHEET 40X57 (DRAPES) IMPLANT
DRAPE LAPAROTOMY 100X72X124 (DRAPES) ×3 IMPLANT
DRAPE MICROSCOPE LEICA (MISCELLANEOUS) ×3 IMPLANT
DRAPE POUCH INSTRU U-SHP 10X18 (DRAPES) ×3 IMPLANT
DRAPE SURG 17X23 STRL (DRAPES) ×6 IMPLANT
DRSG OPSITE POSTOP 3X4 (GAUZE/BANDAGES/DRESSINGS) ×3 IMPLANT
DURAPREP 26ML APPLICATOR (WOUND CARE) ×3 IMPLANT
ELECT REM PT RETURN 9FT ADLT (ELECTROSURGICAL) ×3
ELECTRODE REM PT RTRN 9FT ADLT (ELECTROSURGICAL) ×1 IMPLANT
GAUZE SPONGE 4X4 12PLY STRL (GAUZE/BANDAGES/DRESSINGS) ×3 IMPLANT
GAUZE SPONGE 4X4 16PLY XRAY LF (GAUZE/BANDAGES/DRESSINGS) IMPLANT
GLOVE BIOGEL PI IND STRL 7.5 (GLOVE) ×1 IMPLANT
GLOVE BIOGEL PI IND STRL 8 (GLOVE) ×2 IMPLANT
GLOVE BIOGEL PI INDICATOR 7.5 (GLOVE) ×2
GLOVE BIOGEL PI INDICATOR 8 (GLOVE) ×4
GLOVE ECLIPSE 7.5 STRL STRAW (GLOVE) ×9 IMPLANT
GLOVE ECLIPSE 9.0 STRL (GLOVE) ×3 IMPLANT
GLOVE EXAM NITRILE LRG STRL (GLOVE) IMPLANT
GLOVE EXAM NITRILE XL STR (GLOVE) IMPLANT
GLOVE EXAM NITRILE XS STR PU (GLOVE) ×3 IMPLANT
GLOVE OPTIFIT SS 7.0 STRL BRWN (GLOVE) ×1
GLOVE OPTIFIT SS 7.5 STRL LX (GLOVE) ×2 IMPLANT
GOWN STRL REUS W/ TWL LRG LVL3 (GOWN DISPOSABLE) ×1 IMPLANT
GOWN STRL REUS W/ TWL XL LVL3 (GOWN DISPOSABLE) ×1 IMPLANT
GOWN STRL REUS W/TWL 2XL LVL3 (GOWN DISPOSABLE) ×6 IMPLANT
GOWN STRL REUS W/TWL LRG LVL3 (GOWN DISPOSABLE) ×2
GOWN STRL REUS W/TWL XL LVL3 (GOWN DISPOSABLE) ×2
KIT BASIN OR (CUSTOM PROCEDURE TRAY) ×3 IMPLANT
KIT ROOM TURNOVER OR (KITS) ×3 IMPLANT
NEEDLE HYPO 22GX1.5 SAFETY (NEEDLE) ×3 IMPLANT
NEEDLE SPNL 22GX3.5 QUINCKE BK (NEEDLE) ×3 IMPLANT
NS IRRIG 1000ML POUR BTL (IV SOLUTION) ×3 IMPLANT
OIL CARTRIDGE MAESTRO DRILL (MISCELLANEOUS) ×3
PACK LAMINECTOMY NEURO (CUSTOM PROCEDURE TRAY) ×3 IMPLANT
PAD ARMBOARD 7.5X6 YLW CONV (MISCELLANEOUS) ×9 IMPLANT
RUBBERBAND STERILE (MISCELLANEOUS) ×6 IMPLANT
SPONGE SURGIFOAM ABS GEL SZ50 (HEMOSTASIS) ×3 IMPLANT
STRIP CLOSURE SKIN 1/2X4 (GAUZE/BANDAGES/DRESSINGS) ×4 IMPLANT
SUT VIC AB 2-0 CT1 18 (SUTURE) ×3 IMPLANT
SUT VIC AB 3-0 SH 8-18 (SUTURE) ×3 IMPLANT
TOWEL GREEN STERILE (TOWEL DISPOSABLE) ×3 IMPLANT
TOWEL GREEN STERILE FF (TOWEL DISPOSABLE) ×3 IMPLANT
WATER STERILE IRR 1000ML POUR (IV SOLUTION) ×3 IMPLANT

## 2016-08-01 NOTE — Brief Op Note (Signed)
08/01/2016  9:47 AM  PATIENT:  Grace [name]  66 y.o. female  PRE-OPERATIVE DIAGNOSIS:  Stenosis  POST-OPERATIVE DIAGNOSIS:  Stenosis  PROCEDURE:  Procedure(s): Left Lumbar Four-Five Laminectomy and Foraminotomy (Left)  SURGEON:  Surgeon(s) and Role:    * Ditty, Kevan Ny, MD - Assisting    * Earnie Larsson, MD - Primary  PHYSICIAN ASSISTANT:   ASSISTANTS:    ANESTHESIA:   general  EBL:  Total I/O In: 1000 [I.V.:1000] Out: 150 [Blood:150]  BLOOD ADMINISTERED:none  DRAINS: none   LOCAL MEDICATIONS USED:  MARCAINE     SPECIMEN:  No Specimen  DISPOSITION OF SPECIMEN:  N/A  COUNTS:  YES  TOURNIQUET:  * No tourniquets in log *  DICTATION: .Dragon Dictation  PLAN OF CARE: Admit for overnight observation  PATIENT DISPOSITION:  PACU - hemodynamically stable.   Delay start of Pharmacological VTE agent (>24hrs) due to surgical blood loss or risk of bleeding: yes

## 2016-08-01 NOTE — Anesthesia Preprocedure Evaluation (Addendum)
Anesthesia Evaluation  Patient identified by MRN, date of birth, ID band Patient awake    Reviewed: Allergy & Precautions, NPO status , Patient's Chart, lab work & pertinent test results  Airway Mallampati: II       Dental   Pulmonary shortness of breath, asthma , pneumonia, COPD, former smoker,    breath sounds clear to auscultation       Cardiovascular hypertension, +CHF   Rhythm:Regular Rate:Normal     Neuro/Psych    GI/Hepatic Neg liver ROS, GERD  ,  Endo/Other    Renal/GU negative Renal ROS     Musculoskeletal  (+) Arthritis ,   Abdominal   Peds  Hematology   Anesthesia Other Findings   Reproductive/Obstetrics                            Anesthesia Physical Anesthesia Plan  ASA: III  Anesthesia Plan: General   Post-op Pain Management:    Induction: Intravenous  PONV Risk Score and Plan: 3 and Ondansetron, Dexamethasone, Propofol and Midazolam  Airway Management Planned: Oral ETT  Additional Equipment:   Intra-op Plan:   Post-operative Plan: Possible Post-op intubation/ventilation  Informed Consent: I have reviewed the patients History and Physical, chart, labs and discussed the procedure including the risks, benefits and alternatives for the proposed anesthesia with the patient or authorized representative who has indicated his/her understanding and acceptance.   Dental advisory given  Plan Discussed with: CRNA and Anesthesiologist  Anesthesia Plan Comments:        Anesthesia Quick Evaluation

## 2016-08-01 NOTE — Discharge Summary (Signed)
Physician Discharge Summary  Patient ID: Grace [name] MRN: [id number] DOB/AGE: 08/26/1950 66 y.o.  Admit date: 08/01/2016 Discharge date: 08/01/2016  Admission Diagnoses:  Discharge Diagnoses:  Active Problems:   Lumbar stenosis with neurogenic claudication   Discharged Condition: good  Hospital Course: Patient admitted the hospital where she underwent uncomplicated lumbar decompression. Postoperative she is doing well. Patient now able to stand and ambulate with minimal difficulty. Preoperative severe pain much improved. Ready for discharge home  Consults:   Significant Diagnostic Studies:   Treatments:   Discharge Exam: Blood pressure 126/71, pulse 94, temperature 97.6 F (36.4 C), resp. rate 18, weight 106.1 kg (234 lb), SpO2 95 %. Awake and alert. Oriented and appropriate. Cranial nerve function intact. Motor and sensory function extremities normal. Wound clean and dry. Chest and abdomen benign.  Disposition: 01-Home or Self Care   Allergies as of 08/01/2016      Reactions   Iodine Anaphylaxis   IVP dye      Medication List    TAKE these medications   albuterol 108 (90 Base) MCG/ACT inhaler Commonly known as:  PROVENTIL HFA;VENTOLIN HFA Inhale 2 puffs into the lungs every 6 (six) hours as needed for shortness of breath.   alendronate 70 MG tablet Commonly known as:  FOSAMAX Take 70 mg by mouth every Monday.   ARIPiprazole 5 MG tablet Commonly known as:  ABILIFY Take 5 mg by mouth daily.   cyclobenzaprine 10 MG tablet Commonly known as:  FLEXERIL Take 1 tablet (10 mg total) by mouth 3 (three) times daily as needed for muscle spasms.   diltiazem 30 MG tablet Commonly known as:  CARDIZEM Take 1 tablet (30 mg total) by mouth 3 (three) times daily as needed.   DULoxetine 60 MG capsule Commonly known as:  CYMBALTA Take 60 mg by mouth daily.   ibuprofen 200 MG tablet Commonly known as:  ADVIL,MOTRIN Take 800 mg by mouth every 8 (eight) hours as needed  (for pain.).   LORazepam 0.5 MG tablet Commonly known as:  ATIVAN Take 0.5 mg by mouth daily as needed for anxiety.   losartan 25 MG tablet Commonly known as:  COZAAR Take 1 tablet (25 mg total) by mouth daily.   metoprolol succinate 100 MG 24 hr tablet Commonly known as:  TOPROL-XL TAKE ONE TABLET BY MOUTH ONCE DAILY   modafinil 200 MG tablet Commonly known as:  PROVIGIL Take 200 mg by mouth daily as needed (for alertness 3rd shift).   oxyCODONE-acetaminophen 5-325 MG tablet Commonly known as:  PERCOCET/ROXICET Take 1 tablet by mouth every 6 (six) hours as needed. For pain. What changed:  Another medication with the same name was changed. Make sure you understand how and when to take each.   oxyCODONE-acetaminophen 10-325 MG tablet Commonly known as:  PERCOCET Take 1-2 tablets by mouth every 4 (four) hours as needed for pain. What changed:  how much to take  when to take this   sacubitril-valsartan 49-51 MG Commonly known as:  ENTRESTO Take 1 tablet by mouth 2 (two) times daily.        Signed: Talib Headley A 08/01/2016, 5:08 PM

## 2016-08-01 NOTE — Transfer of Care (Signed)
Immediate Anesthesia Transfer of Care Note  Patient: Grace [name]  Procedure(s) Performed: Procedure(s): Left Lumbar Four-Five Laminectomy and Foraminotomy (Left)  Patient Location: PACU  Anesthesia Type:General  Level of Consciousness: awake, alert , oriented and patient cooperative  Airway & Oxygen Therapy: Patient Spontanous Breathing and Patient connected to nasal cannula oxygen  Post-op Assessment: Report given to RN and Post -op Vital signs reviewed and stable  Post vital signs: Reviewed and stable  Last Vitals:  Vitals:   08/01/16 0959 08/01/16 1000  BP: (P) 116/60 116/60  Pulse: (P) 81 82  Resp: (P) 11 17  Temp: (P) 36.1 C     Last Pain: There were no vitals filed for this visit.    Patients Stated Pain Goal: 3 (00/93/81 8299)  Complications: No apparent anesthesia complications

## 2016-08-01 NOTE — Op Note (Signed)
Date of procedure: 08/01/2016  Date of dictation: Same  Service: Neurosurgery  Preoperative diagnosis: L4-L5 stenosis with neurogenic claudication  Postoperative diagnosis: Same  Procedure Name: Bilateral L4-L5 decompressive laminotomy with foraminotomies  Surgeon:Desmund Elman A.Fuller Makin, M.D.  Asst. Surgeon: Ditty  Anesthesia: General  Indication: 66 year old female with increasing lower lumbar pain with radiation to both lower extremities interfering with her ability to walk. Workup demonstrates evidence of significant spondylosis and stenosis at L4-5. Patient is failed conservative management. She presents now for decompressive surgery in hopes of improving her symptoms.  Operative note: After induction of anesthesia, patient position prone onto Wilson frame and a properly padded. Lumbar region prepped and draped sterilely. Incision made overlying L4-5. Dissection performed first on the left side. Retractor placed. X-ray taken in the L5-S1 level was exposed. Dissection redirected 1 level cephalad. L4 and L5 lamina dissected free bilaterally. Retractor placed. Bilateral decompressive laminotomies and foraminotomies and performed using high-speed drill and Kerrison rongeurs to remove the inferior aspect lamina of L4 medial aspect the L4-5 facet joint and the superior rim of the L5 lamina. Ligament flavum elevated and resected. Foraminotomies were then completed on the course exiting L4 and L5 nerve roots bilaterally. This went very thorough decompression had been achieved. There was no evidence of injury to thecal sac. Wounds and irrigated MI solution. Gelfoam was placed topically for hemostasis. Wounds and close in layers with Vicryl sutures. Steri-Strips and sterile dressing were applied. No apparent complications. Patient tolerated the procedure well and she returns to the recovery room postop.

## 2016-08-01 NOTE — Discharge Instructions (Signed)

## 2016-08-01 NOTE — Anesthesia Procedure Notes (Signed)
Procedure Name: Intubation Date/Time: 08/01/2016 8:10 AM Performed by: Salli Quarry Juanita Streight Pre-anesthesia Checklist: Patient identified, Emergency Drugs available, Suction available and Patient being monitored Patient Re-evaluated:Patient Re-evaluated prior to inductionOxygen Delivery Method: Circle System Utilized Preoxygenation: Pre-oxygenation with 100% oxygen Intubation Type: IV induction Ventilation: Mask ventilation without difficulty Laryngoscope Size: Glidescope and 3 (electively due to poor dentention) Grade View: Grade I Tube type: Oral Tube size: 7.0 mm Number of attempts: 1 Airway Equipment and Method: Stylet and Oral airway Placement Confirmation: ETT inserted through vocal cords under direct vision,  positive ETCO2 and breath sounds checked- equal and bilateral Secured at: 21 cm Tube secured with: Tape Dental Injury: Teeth and Oropharynx as per pre-operative assessment  Comments: Intubation by Dicie Beam, SRNA

## 2016-08-01 NOTE — H&P (Signed)
Grace [name] is an 66 y.o. female.   Chief Complaint: Bilateral leg weakness HPI: 66 year old female with a history of a upper thoracic compression fracture with myelopathy presents with increasing bilateral lower extremity pain and weakness aggravated by standing and attempts at walking. Workup demonstrates evidence of very significant spinal stenosis at L4-5. Patient has failed conservative management. She presents now for lumbar decompressive surgery in hopes of improving her situation. Her situation is complicated by severe chronic obstructive pulmonary disease as well as marked osteoporosis.  Past Medical History:  Diagnosis Date  . Anxiety   . Arthritis   . Asthma   . Cardiomyopathy ([place])    nonischemic (EF 35-40%)  . COPD (chronic obstructive pulmonary disease) ([place])   . Depression   . Dyspnea   . GERD (gastroesophageal reflux disease)   . Hyperlipidemia   . Hypertension   . Pneumonia   . PVC's (premature ventricular contractions)    states 10 years ago    Past Surgical History:  Procedure Laterality Date  . APPENDECTOMY    . CARDIAC CATHETERIZATION    . CATARACT EXTRACTION     right eye   . CESAREAN SECTION    . COLONOSCOPY      Family History  Problem Relation Age of Onset  . Heart attack Father 28       MI x 3    Social History:  reports that she has quit smoking. Her smoking use included Cigarettes. She has a 25.00 pack-year smoking history. She has never used smokeless tobacco. She reports that she does not drink alcohol or use drugs.  Allergies:  Allergies  Allergen Reactions  . Iodine Anaphylaxis    IVP dye    Medications Prior to Admission  Medication Sig Dispense Refill  . albuterol (PROVENTIL HFA;VENTOLIN HFA) 108 (90 BASE) MCG/ACT inhaler Inhale 2 puffs into the lungs every 6 (six) hours as needed for shortness of breath.     Marland Kitchen alendronate (FOSAMAX) 70 MG tablet Take 70 mg by mouth every Monday.    . ARIPiprazole (ABILIFY) 5 MG tablet Take 5 mg  by mouth daily.    . DULoxetine (CYMBALTA) 60 MG capsule Take 60 mg by mouth daily.    Marland Kitchen ibuprofen (ADVIL,MOTRIN) 200 MG tablet Take 800 mg by mouth every 8 (eight) hours as needed (for pain.).    Marland Kitchen LORazepam (ATIVAN) 0.5 MG tablet Take 0.5 mg by mouth daily as needed for anxiety.     Marland Kitchen losartan (COZAAR) 25 MG tablet Take 1 tablet (25 mg total) by mouth daily. 30 tablet 0  . metoprolol succinate (TOPROL-XL) 100 MG 24 hr tablet TAKE ONE TABLET BY MOUTH ONCE DAILY 30 tablet 0  . modafinil (PROVIGIL) 200 MG tablet Take 200 mg by mouth daily as needed (for alertness 3rd shift).     Marland Kitchen oxyCODONE-acetaminophen (PERCOCET/ROXICET) 5-325 MG tablet Take 1 tablet by mouth every 6 (six) hours as needed. For pain.  0  . sacubitril-valsartan (ENTRESTO) 49-51 MG Take 1 tablet by mouth 2 (two) times daily. 60 tablet 6  . diltiazem (CARDIZEM) 30 MG tablet Take 1 tablet (30 mg total) by mouth 3 (three) times daily as needed. 90 tablet 3  . oxyCODONE-acetaminophen (PERCOCET) 10-325 MG tablet Take 1 tablet by mouth every 6 (six) hours as needed for pain. 10 tablet 0    No results found for this or any previous visit (from the past 48 hour(s)). No results found.  Pertinent items noted in HPI and remainder of  comprehensive ROS otherwise negative.  Blood pressure (!) 144/70, pulse 78, temperature 98.1 F (36.7 C), resp. rate 18, weight 106.1 kg (234 lb), SpO2 93 %.  Patient is awake and alert. She is oriented and appropriate. Her speech is fluent. Judgment and insight are intact. Cranial nerve function is intact bilaterally. Motor examination of her extremities reveals some diffuse distal weakness in both lower extremities but no focal mild total areas of weakness. Sensory examination was decreased sensation over light touch in bilateral L5 dermatomes. Deep tendon reflexes normal active. No evidence of long track signs. Gait is somewhat spastic and difficult for the patient to perform. Examination head ears eyes and  thirds unremarkable. Chest and abdomen are benign. Extremities are free from injury or deformity. Assessment/Plan L4-5 stenosis with neurogenic claudication. Plan bilateral L4-5 decompressive laminotomies and foraminotomies. Risks and benefits of been explained. Patient wishes to proceed.  Grace [name] A 08/01/2016, 7:48 AM

## 2016-08-01 NOTE — Progress Notes (Signed)
Patient alert and oriented, mae's well, voiding adequate amount of urine, swallowing without difficulty, no c/o pain at time of discharge. Patient discharged home with family. Script and discharged instructions given to patient. Patient and family stated understanding of instructions given. Patient has an appointment with Dr. Pool  

## 2016-08-01 NOTE — Anesthesia Postprocedure Evaluation (Signed)
Anesthesia Post Note  Patient: Grace [name]  Procedure(s) Performed: Procedure(s) (LRB): Left Lumbar Four-Five Laminectomy and Foraminotomy (Left)     Patient location during evaluation: PACU Anesthesia Type: General Level of consciousness: awake Pain management: pain level controlled Vital Signs Assessment: post-procedure vital signs reviewed and stable Respiratory status: spontaneous breathing Cardiovascular status: stable Anesthetic complications: no    Last Vitals:  Vitals:   08/01/16 1058 08/01/16 1300  BP: (!) 102/49 121/76  Pulse: 66 86  Resp: 18 18  Temp: 37.3 C 36.8 C    Last Pain:  Vitals:   08/01/16 1201  PainSc: 3                  Quetzaly Ebner

## 2016-08-02 ENCOUNTER — Encounter (HOSPITAL_COMMUNITY): Payer: Self-pay | Admitting: Neurosurgery

## 2016-08-09 DIAGNOSIS — R0609 Other forms of dyspnea: Secondary | ICD-10-CM | POA: Diagnosis not present

## 2016-08-09 DIAGNOSIS — J449 Chronic obstructive pulmonary disease, unspecified: Secondary | ICD-10-CM | POA: Diagnosis not present

## 2016-08-09 DIAGNOSIS — I420 Dilated cardiomyopathy: Secondary | ICD-10-CM | POA: Diagnosis not present

## 2016-08-09 DIAGNOSIS — Z9889 Other specified postprocedural states: Secondary | ICD-10-CM | POA: Diagnosis not present

## 2016-08-09 DIAGNOSIS — R5381 Other malaise: Secondary | ICD-10-CM | POA: Diagnosis not present

## 2016-10-10 ENCOUNTER — Other Ambulatory Visit: Payer: Medicare Other

## 2016-10-11 ENCOUNTER — Other Ambulatory Visit (INDEPENDENT_AMBULATORY_CARE_PROVIDER_SITE_OTHER): Payer: Medicare Other | Admitting: *Deleted

## 2016-10-11 DIAGNOSIS — J449 Chronic obstructive pulmonary disease, unspecified: Secondary | ICD-10-CM

## 2016-10-11 DIAGNOSIS — R0602 Shortness of breath: Secondary | ICD-10-CM

## 2016-10-11 DIAGNOSIS — I420 Dilated cardiomyopathy: Secondary | ICD-10-CM

## 2016-10-11 DIAGNOSIS — S22000S Wedge compression fracture of unspecified thoracic vertebra, sequela: Secondary | ICD-10-CM

## 2016-10-12 LAB — BASIC METABOLIC PANEL
BUN / CREAT RATIO: 13 (ref 12–28)
BUN: 10 mg/dL (ref 8–27)
CALCIUM: 9.3 mg/dL (ref 8.7–10.3)
CHLORIDE: 100 mmol/L (ref 96–106)
CO2: 21 mmol/L (ref 20–29)
Creatinine, Ser: 0.75 mg/dL (ref 0.57–1.00)
GFR calc non Af Amer: 84 mL/min/{1.73_m2} (ref >59)
GFR, EST AFRICAN AMERICAN: 97 mL/min/{1.73_m2} (ref >59)
Glucose: 115 mg/dL — ABNORMAL HIGH (ref 65–99)
POTASSIUM: 4.1 mmol/L (ref 3.5–5.2)
Sodium: 140 mmol/L (ref 134–144)

## 2016-10-18 DIAGNOSIS — M545 Low back pain: Secondary | ICD-10-CM | POA: Diagnosis not present

## 2016-10-23 NOTE — Progress Notes (Signed)
Cardiology Office Note  Date:  10/24/2016   ID:  Grace [name], DOB 04-10-1950, MRN [id number]  PCP:  [name], MD   Chief Complaint  Patient presents with  . other    3 month follow up. Meds reviewed by the pt. verbally. Pt. c/o shortness of breath and palpitations.     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 Earnie Larsson. [facility] During this time she gained significant weight  She is not on Lasix previously with tachycardia, not a big complaint on today's visit  Chronic left lower extremity weakness   EKG personally reviewed by myself on todays visit Shows normal sinus rhythm rate 78 bpm no significant ST or T-wave changes  Previous histories of bronchitis  November 2015, again February 2016 both treated with antibiotics and prednisone.  She stopped smoking 2 years ago   PMH:   has a past medical history of Anxiety; Arthritis; Asthma; Cardiomyopathy ([place]); COPD (chronic obstructive pulmonary disease) ([place]); Depression; Dyspnea; GERD (gastroesophageal reflux disease); Hyperlipidemia; Hypertension; Pneumonia; and PVC's (premature ventricular contractions).  PSH:    Past Surgical History:  Procedure Laterality Date  . APPENDECTOMY    . CARDIAC CATHETERIZATION    . CATARACT EXTRACTION     right eye   . CESAREAN SECTION    . COLONOSCOPY    . LUMBAR LAMINECTOMY/DECOMPRESSION MICRODISCECTOMY Left 08/01/2016   Procedure: Left Lumbar Four-Five Laminectomy and  Foraminotomy;  Surgeon: Earnie Larsson, MD;  Location: [place];  Service: Neurosurgery;  Laterality: Left;    Current Outpatient Prescriptions  Medication Sig Dispense Refill  . albuterol (PROVENTIL HFA;VENTOLIN HFA) 108 (90 BASE) MCG/ACT inhaler Inhale 2 puffs into the lungs every 6 (six) hours as needed for shortness of breath.     Marland Kitchen alendronate (FOSAMAX) 70 MG tablet Take 70 mg by mouth every Monday.    . ARIPiprazole (ABILIFY) 5 MG tablet Take 5 mg by mouth daily.    Marland Kitchen diltiazem (CARDIZEM) 30 MG tablet Take 1 tablet (30 mg total) by mouth 3 (three) times daily as needed. 90 tablet 3  . DULoxetine (CYMBALTA) 60 MG capsule Take 60 mg by mouth daily.    Marland Kitchen ibuprofen (ADVIL,MOTRIN) 200 MG tablet Take 800 mg by mouth every 8 (eight) hours as needed (for pain.).    Marland Kitchen LORazepam (ATIVAN) 0.5 MG tablet Take 0.5 mg by mouth daily as needed for anxiety.     . metoprolol succinate (TOPROL-XL) 100 MG 24 hr tablet TAKE ONE TABLET BY MOUTH ONCE DAILY 30 tablet 0  . modafinil (PROVIGIL) 200 MG tablet Take 200 mg by mouth daily as needed (for alertness 3rd shift).     Marland Kitchen oxyCODONE-acetaminophen (PERCOCET/ROXICET) 5-325 MG tablet Take 1 tablet by mouth every 6 (six) hours as needed. For pain.  0  . sacubitril-valsartan (ENTRESTO) 49-51 MG Take 1 tablet by mouth 2 (two) times daily. 60 tablet 6   No current facility-administered medications for this visit.      Allergies:   Iodine   Social History:  The patient  reports that she has quit smoking. Her smoking use included Cigarettes. She has a 25.00 pack-year smoking history. She has never used smokeless tobacco. She reports that she does not drink alcohol or use drugs.   Family History:   family history includes Heart attack (age of onset: 71) in her father.    Review of Systems: Review of Systems  Constitutional: Negative.   Respiratory: Negative.   Cardiovascular: Negative.   Gastrointestinal: Negative.   Musculoskeletal: Positive for back pain.        Leg weakness  Neurological: Negative.   Psychiatric/Behavioral: Negative.   All other systems reviewed and are negative.    PHYSICAL EXAM: VS:  BP 120/64 (BP Location: Left Arm, Patient Position: Sitting, Cuff Size: Normal)   Pulse 78   Ht 5\' 5"  (1.651 m)   Wt 228 lb (103.4 kg)   BMI 37.94 kg/m  , BMI Body mass index is 37.94 kg/m. GEN: Well nourished, well developed, in no acute distress  HEENT: normal  Neck: no JVD, carotid bruits, or masses Cardiac: RRR; no murmurs, rubs, or gallops,no edema  Respiratory:  clear to auscultation bilaterally, normal work of breathing GI: soft, nontender, nondistended, + BS Grace: no deformity or atrophy  Skin: warm and dry, no rash Neuro:  Strength and sensation are intact Psych: euthymic mood, full affect    Recent Labs: 07/25/2016: Hemoglobin 13.9; Platelets 237 10/11/2016: BUN 10; Creatinine, Ser 0.75; Potassium 4.1; Sodium 140    Lipid Panel No results found for: CHOL, HDL, LDLCALC, TRIG    Wt Readings from Last 3 Encounters:  10/24/16 228 lb (103.4 kg)  08/01/16 234 lb (106.1 kg)  07/25/16 234 lb 12.8 oz (106.5 kg)       ASSESSMENT AND PLAN:  Congestive dilated cardiomyopathy ([place]) - Plan: EKG 12-Lead ejection fraction 35-40%, seen previously 2016 Tolerating entresto 49/51 mg twice a day We will increase up to 1-1/2 pills twice a day Labwork is stable Repeat echocardiogram  SOB (shortness of breath) - Plan: EKG 12-Lead Appears euvolemic, Sedentary at baseline secondary to her chronic back issues  COPD (chronic obstructive pulmonary disease) with chronic bronchitis ([facility]) -  She stopped smoking 2 years ago Reports her breathing is stable, recommended walking program as legs tolerate  Closed compression fracture of thoracic vertebra, sequela - Plan: EKG 12-Lead Previous surgery  Disposition:   F/U  12 months   Total encounter time more than 25 minutes  Greater than 50% was spent in counseling and coordination of care  with the patient    No orders of the defined types were placed in this encounter.    Signed, Esmond Plants, M.D., Ph.D. 10/24/2016  [place], [place]

## 2016-10-24 ENCOUNTER — Encounter: Payer: Self-pay | Admitting: Cardiovascular Disease

## 2016-10-24 ENCOUNTER — Ambulatory Visit (INDEPENDENT_AMBULATORY_CARE_PROVIDER_SITE_OTHER): Payer: Medicare Other | Admitting: Cardiovascular Disease

## 2016-10-24 VITALS — BP 120/64 | HR 78 | Ht 65.0 in | Wt 228.0 lb

## 2016-10-24 DIAGNOSIS — I5023 Acute on chronic systolic (congestive) heart failure: Secondary | ICD-10-CM

## 2016-10-24 DIAGNOSIS — R0602 Shortness of breath: Secondary | ICD-10-CM | POA: Diagnosis not present

## 2016-10-24 DIAGNOSIS — I429 Cardiomyopathy, unspecified: Secondary | ICD-10-CM

## 2016-10-24 DIAGNOSIS — M5417 Radiculopathy, lumbosacral region: Secondary | ICD-10-CM | POA: Diagnosis not present

## 2016-10-24 DIAGNOSIS — M48062 Spinal stenosis, lumbar region with neurogenic claudication: Secondary | ICD-10-CM | POA: Diagnosis not present

## 2016-10-24 DIAGNOSIS — M9903 Segmental and somatic dysfunction of lumbar region: Secondary | ICD-10-CM | POA: Diagnosis not present

## 2016-10-24 DIAGNOSIS — J449 Chronic obstructive pulmonary disease, unspecified: Secondary | ICD-10-CM | POA: Diagnosis not present

## 2016-10-24 DIAGNOSIS — I420 Dilated cardiomyopathy: Secondary | ICD-10-CM | POA: Diagnosis not present

## 2016-10-24 DIAGNOSIS — M9901 Segmental and somatic dysfunction of cervical region: Secondary | ICD-10-CM | POA: Diagnosis not present

## 2016-10-24 DIAGNOSIS — M50122 Cervical disc disorder at C5-C6 level with radiculopathy: Secondary | ICD-10-CM | POA: Diagnosis not present

## 2016-10-24 NOTE — Patient Instructions (Addendum)
Medication Instructions:   Please try entresto 1 1/2 pills twice a day Check pressures  Labwork:  No new labs needed  Testing/Procedures:  Echo for cardiomyopathy in a few weeks   Follow-Up: It was a pleasure seeing you in the office today. Please call us if you have new issues that need to be addressed before your next appt.  [phone]  Your physician wants you to follow-up in: 12 months.  You will receive a reminder letter in the mail two months in advance. If you don't receive a letter, please call our office to schedule the follow-up appointment.  If you need a refill on your cardiac medications before your next appointment, please call your pharmacy.  Medication Samples have been provided to the patient.  Drug name: Delene Loll       Strength: 49-51 mg        Qty: 4 bottles  LOT: [id number]  Exp.Date: may 2020

## 2016-10-25 DIAGNOSIS — M545 Low back pain: Secondary | ICD-10-CM | POA: Diagnosis not present

## 2016-11-01 DIAGNOSIS — M545 Low back pain: Secondary | ICD-10-CM | POA: Diagnosis not present

## 2016-11-01 DIAGNOSIS — M50122 Cervical disc disorder at C5-C6 level with radiculopathy: Secondary | ICD-10-CM | POA: Diagnosis not present

## 2016-11-01 DIAGNOSIS — M9903 Segmental and somatic dysfunction of lumbar region: Secondary | ICD-10-CM | POA: Diagnosis not present

## 2016-11-01 DIAGNOSIS — M5417 Radiculopathy, lumbosacral region: Secondary | ICD-10-CM | POA: Diagnosis not present

## 2016-11-01 DIAGNOSIS — M9901 Segmental and somatic dysfunction of cervical region: Secondary | ICD-10-CM | POA: Diagnosis not present

## 2016-11-03 DIAGNOSIS — M545 Low back pain: Secondary | ICD-10-CM | POA: Diagnosis not present

## 2016-11-08 DIAGNOSIS — M9901 Segmental and somatic dysfunction of cervical region: Secondary | ICD-10-CM | POA: Diagnosis not present

## 2016-11-08 DIAGNOSIS — M545 Low back pain: Secondary | ICD-10-CM | POA: Diagnosis not present

## 2016-11-08 DIAGNOSIS — M50122 Cervical disc disorder at C5-C6 level with radiculopathy: Secondary | ICD-10-CM | POA: Diagnosis not present

## 2016-11-08 DIAGNOSIS — M5417 Radiculopathy, lumbosacral region: Secondary | ICD-10-CM | POA: Diagnosis not present

## 2016-11-08 DIAGNOSIS — M48061 Spinal stenosis, lumbar region without neurogenic claudication: Secondary | ICD-10-CM | POA: Diagnosis not present

## 2016-11-08 DIAGNOSIS — M9903 Segmental and somatic dysfunction of lumbar region: Secondary | ICD-10-CM | POA: Diagnosis not present

## 2016-11-10 DIAGNOSIS — M545 Low back pain: Secondary | ICD-10-CM | POA: Diagnosis not present

## 2016-11-10 DIAGNOSIS — M9903 Segmental and somatic dysfunction of lumbar region: Secondary | ICD-10-CM | POA: Diagnosis not present

## 2016-11-10 DIAGNOSIS — M50122 Cervical disc disorder at C5-C6 level with radiculopathy: Secondary | ICD-10-CM | POA: Diagnosis not present

## 2016-11-10 DIAGNOSIS — M5417 Radiculopathy, lumbosacral region: Secondary | ICD-10-CM | POA: Diagnosis not present

## 2016-11-10 DIAGNOSIS — M9901 Segmental and somatic dysfunction of cervical region: Secondary | ICD-10-CM | POA: Diagnosis not present

## 2016-11-15 DIAGNOSIS — M9901 Segmental and somatic dysfunction of cervical region: Secondary | ICD-10-CM | POA: Diagnosis not present

## 2016-11-15 DIAGNOSIS — M545 Low back pain: Secondary | ICD-10-CM | POA: Diagnosis not present

## 2016-11-15 DIAGNOSIS — M9903 Segmental and somatic dysfunction of lumbar region: Secondary | ICD-10-CM | POA: Diagnosis not present

## 2016-11-15 DIAGNOSIS — M50122 Cervical disc disorder at C5-C6 level with radiculopathy: Secondary | ICD-10-CM | POA: Diagnosis not present

## 2016-11-15 DIAGNOSIS — M5417 Radiculopathy, lumbosacral region: Secondary | ICD-10-CM | POA: Diagnosis not present

## 2016-11-21 ENCOUNTER — Encounter: Payer: Self-pay | Admitting: Nurse Practitioner

## 2016-11-21 ENCOUNTER — Ambulatory Visit: Payer: Medicare Other | Attending: Nurse Practitioner | Admitting: Nurse Practitioner

## 2016-11-21 VITALS — BP 100/55 | HR 68 | Temp 98.1°F | Resp 16 | Ht 65.0 in | Wt 227.3 lb

## 2016-11-21 DIAGNOSIS — M170 Bilateral primary osteoarthritis of knee: Secondary | ICD-10-CM | POA: Diagnosis not present

## 2016-11-21 DIAGNOSIS — I110 Hypertensive heart disease with heart failure: Secondary | ICD-10-CM | POA: Diagnosis not present

## 2016-11-21 DIAGNOSIS — Z6837 Body mass index (BMI) 37.0-37.9, adult: Secondary | ICD-10-CM | POA: Diagnosis not present

## 2016-11-21 DIAGNOSIS — G8929 Other chronic pain: Secondary | ICD-10-CM | POA: Insufficient documentation

## 2016-11-21 DIAGNOSIS — M25562 Pain in left knee: Secondary | ICD-10-CM | POA: Diagnosis not present

## 2016-11-21 DIAGNOSIS — I509 Heart failure, unspecified: Secondary | ICD-10-CM | POA: Diagnosis not present

## 2016-11-21 DIAGNOSIS — K219 Gastro-esophageal reflux disease without esophagitis: Secondary | ICD-10-CM | POA: Insufficient documentation

## 2016-11-21 DIAGNOSIS — I429 Cardiomyopathy, unspecified: Secondary | ICD-10-CM | POA: Insufficient documentation

## 2016-11-21 DIAGNOSIS — M25561 Pain in right knee: Secondary | ICD-10-CM

## 2016-11-21 DIAGNOSIS — R911 Solitary pulmonary nodule: Secondary | ICD-10-CM | POA: Insufficient documentation

## 2016-11-21 DIAGNOSIS — M47812 Spondylosis without myelopathy or radiculopathy, cervical region: Secondary | ICD-10-CM | POA: Insufficient documentation

## 2016-11-21 DIAGNOSIS — F329 Major depressive disorder, single episode, unspecified: Secondary | ICD-10-CM | POA: Insufficient documentation

## 2016-11-21 DIAGNOSIS — E785 Hyperlipidemia, unspecified: Secondary | ICD-10-CM | POA: Insufficient documentation

## 2016-11-21 DIAGNOSIS — M19041 Primary osteoarthritis, right hand: Secondary | ICD-10-CM | POA: Insufficient documentation

## 2016-11-21 DIAGNOSIS — K279 Peptic ulcer, site unspecified, unspecified as acute or chronic, without hemorrhage or perforation: Secondary | ICD-10-CM | POA: Insufficient documentation

## 2016-11-21 DIAGNOSIS — Z87891 Personal history of nicotine dependence: Secondary | ICD-10-CM | POA: Insufficient documentation

## 2016-11-21 DIAGNOSIS — I420 Dilated cardiomyopathy: Secondary | ICD-10-CM | POA: Insufficient documentation

## 2016-11-21 DIAGNOSIS — J449 Chronic obstructive pulmonary disease, unspecified: Secondary | ICD-10-CM | POA: Insufficient documentation

## 2016-11-21 DIAGNOSIS — D219 Benign neoplasm of connective and other soft tissue, unspecified: Secondary | ICD-10-CM | POA: Insufficient documentation

## 2016-11-21 DIAGNOSIS — F419 Anxiety disorder, unspecified: Secondary | ICD-10-CM | POA: Diagnosis not present

## 2016-11-21 DIAGNOSIS — E669 Obesity, unspecified: Secondary | ICD-10-CM | POA: Diagnosis not present

## 2016-11-21 DIAGNOSIS — N6009 Solitary cyst of unspecified breast: Secondary | ICD-10-CM | POA: Insufficient documentation

## 2016-11-21 DIAGNOSIS — Z79899 Other long term (current) drug therapy: Secondary | ICD-10-CM | POA: Insufficient documentation

## 2016-11-21 DIAGNOSIS — G894 Chronic pain syndrome: Secondary | ICD-10-CM

## 2016-11-21 DIAGNOSIS — E119 Type 2 diabetes mellitus without complications: Secondary | ICD-10-CM | POA: Insufficient documentation

## 2016-11-21 DIAGNOSIS — M19042 Primary osteoarthritis, left hand: Secondary | ICD-10-CM

## 2016-11-21 DIAGNOSIS — I493 Ventricular premature depolarization: Secondary | ICD-10-CM | POA: Diagnosis not present

## 2016-11-21 DIAGNOSIS — Z720 Tobacco use: Secondary | ICD-10-CM | POA: Insufficient documentation

## 2016-11-21 DIAGNOSIS — L732 Hidradenitis suppurativa: Secondary | ICD-10-CM | POA: Insufficient documentation

## 2016-11-21 NOTE — Patient Instructions (Addendum)
Pain Management Discharge Instructions  General Discharge Instructions :  If you need to reach your doctor call: Monday-Friday 8:00 am - 4:00 pm at [phone] or toll free [phone].  After clinic hours [phone] to have operator reach doctor.  Bring all of your medication bottles to all your appointments in the pain clinic.  To cancel or reschedule your appointment with Pain Management please remember to call 24 hours in advance to avoid a fee.  Refer to the educational materials which you have been given on: General Risks, I had my Procedure. Discharge Instructions, Post Sedation.  Post Procedure Instructions:  The drugs you were given will stay in your system until tomorrow, so for the next 24 hours you should not drive, make any legal decisions or drink any alcoholic beverages.  You may eat anything you prefer, but it is better to start with liquids then soups and crackers, and gradually work up to solid foods.  Please notify your doctor immediately if you have any unusual bleeding, trouble breathing or pain that is not related to your normal pain.  Depending on the type of procedure that was done, some parts of your body may feel week and/or numb.  This usually clears up by tonight or the next day.  Walk with the use of an assistive device or accompanied by an adult for the 24 hours.  You may use ice on the affected area for the first 24 hours.  Put ice in a Ziploc bag and cover with a towel and place against area 15 minutes on 15 minutes off.  You may switch to heat after 24 hours. Knee Injection A knee injection is a procedure to get medicine into your knee joint. Your health care provider puts a needle into the joint and injects medicine with an attached syringe. The injected medicine may relieve the pain, swelling, and stiffness of arthritis. The injected medicine may also help to lubricate and cushion your knee joint. You may need more than one injection. Tell a  health care provider about:  Any allergies you have.  All medicines you are taking, including vitamins, herbs, eye drops, creams, and over-the-counter medicines.  Any problems you or family members have had with anesthetic medicines.  Any blood disorders you have.  Any surgeries you have had.  Any medical conditions you have. What are the risks? Generally, this is a safe procedure. However, problems may occur, including:  Infection.  Bleeding.  Worsening symptoms.  Damage to the area around your knee.  Allergic reaction to any of the medicines.  Skin reactions from repeated injections.  What happens before the procedure?  Ask your health care provider about changing or stopping your regular medicines. This is especially important if you are taking diabetes medicines or blood thinners.  Plan to have someone take you home after the procedure. What happens during the procedure?  You will sit or lie down in a position for your knee to be treated.  The skin over your kneecap will be cleaned with a germ-killing solution (antiseptic).  You will be given a medicine that numbs the area (local anesthetic). You may feel some stinging.  After your knee becomes numb, you will have a second injection. This is the medicine. This needle is carefully placed between your kneecap and your knee. The medicine is injected into the joint space.  At the end of the procedure, the needle will be removed.  A bandage (dressing) may be placed over the injection site. The  procedure may vary among health care providers and hospitals. What happens after the procedure?  You may have to move your knee through its full range of motion. This helps to get all of the medicine into your joint space.  Your blood pressure, heart rate, breathing rate, and blood oxygen level will be monitored often until the medicines you were given have worn off.  You will be watched to make sure that you do not have a  reaction to the injected medicine. This information is not intended to replace advice given to you by your health care provider. Make sure you discuss any questions you have with your health care provider. Document Released: 04/24/2006 Document Revised: 07/03/2015 Document Reviewed: 12/11/2013 Elsevier Interactive Patient Education  2018 [place]  What are the risk, side effects and possible complications? Generally speaking, most procedures are safe.  However, with any procedure there are risks, side effects, and the possibility of complications.  The risks and complications are dependent upon the sites that are lesioned, or the type of nerve block to be performed.  The closer the procedure is to the spine, the more serious the risks are.  Great care is taken when placing the radio frequency needles, block needles or lesioning probes, but sometimes complications can occur. 1. Infection: Any time there is an injection through the skin, there is a risk of infection.  This is why sterile conditions are used for these blocks.  There are four possible types of infection. 1. Localized skin infection. 2. Central Nervous System Infection-This can be in the form of Meningitis, which can be deadly. 3. Epidural Infections-This can be in the form of an epidural abscess, which can cause pressure inside of the spine, causing compression of the spinal cord with subsequent paralysis. This would require an emergency surgery to decompress, and there are no guarantees that the patient would recover from the paralysis. 4. Discitis-This is an infection of the intervertebral discs.  It occurs in about 1% of discography procedures.  It is difficult to treat and it may lead to surgery.        2. Pain: the needles have to go through skin and soft tissues, will cause soreness.       3. Damage to internal structures:  The nerves to be lesioned may be near blood vessels or    other nerves  which can be potentially damaged.       4. Bleeding: Bleeding is more common if the patient is taking blood thinners such as  aspirin, Coumadin, Ticiid, Plavix, etc., or if he/she have some genetic predisposition  such as hemophilia. Bleeding into the spinal canal can cause compression of the spinal  cord with subsequent paralysis.  This would require an emergency surgery to  decompress and there are no guarantees that the patient would recover from the  paralysis.       5. Pneumothorax:  Puncturing of a lung is a possibility, every time a needle is introduced in  the area of the chest or upper back.  Pneumothorax refers to free air around the  collapsed lung(s), inside of the thoracic cavity (chest cavity).  Another two possible  complications related to a similar event would include: Hemothorax and Chylothorax.   These are variations of the Pneumothorax, where instead of air around the collapsed  lung(s), you may have blood or chyle, respectively.       6. Spinal headaches: They may occur with any procedures in  the area of the spine.       7. Persistent CSF (Cerebro-Spinal Fluid) leakage: This is a rare problem, but may occur  with prolonged intrathecal or epidural catheters either due to the formation of a fistulous  track or a dural tear.       8. Nerve damage: By working so close to the spinal cord, there is always a possibility of  nerve damage, which could be as serious as a permanent spinal cord injury with  paralysis.       9. Death:  Although rare, severe deadly allergic reactions known as "Anaphylactic  reaction" can occur to any of the medications used.      10. Worsening of the symptoms:  We can always make thing worse.  What are the chances of something like this happening? Chances of any of this occuring are extremely low.  By statistics, you have more of a chance of getting killed in a motor vehicle accident: while driving to the hospital than any of the above occurring .  Nevertheless, you  should be aware that they are possibilities.  In general, it is similar to taking a shower.  Everybody knows that you can slip, hit your head and get killed.  Does that mean that you should not shower again?  Nevertheless always keep in mind that statistics do not mean anything if you happen to be on the wrong side of them.  Even if a procedure has a 1 (one) in a 1,000,000 (million) chance of going wrong, it you happen to be that one..Also, keep in mind that by statistics, you have more of a chance of having something go wrong when taking medications.  Who should not have this procedure? If you are on a blood thinning medication (e.g. Coumadin, Plavix, see list of "Blood Thinners"), or if you have an active infection going on, you should not have the procedure.  If you are taking any blood thinners, please inform your physician.  How should I prepare for this procedure?  Do not eat or drink anything at least six hours prior to the procedure.  Bring a driver with you .  It cannot be a taxi.  Come accompanied by an adult that can drive you back, and that is strong enough to help you if your legs get weak or numb from the local anesthetic.  Take all of your medicines the morning of the procedure with just enough water to swallow them.  If you have diabetes, make sure that you are scheduled to have your procedure done first thing in the morning, whenever possible.  If you have diabetes, take only half of your insulin dose and notify our nurse that you have done so as soon as you arrive at the clinic.  If you are diabetic, but only take blood sugar pills (oral hypoglycemic), then do not take them on the morning of your procedure.  You may take them after you have had the procedure.  Do not take aspirin or any aspirin-containing medications, at least eleven (11) days prior to the procedure.  They may prolong bleeding.  Wear loose fitting clothing that may be easy to take off and that you would not  mind if it got stained with Betadine or blood.  Do not wear any jewelry or perfume  Remove any nail coloring.  It will interfere with some of our monitoring equipment.  NOTE: Remember that this is not meant to be interpreted as a complete list of  all possible complications.  Unforeseen problems may occur.  BLOOD THINNERS The following drugs contain aspirin or other products, which can cause increased bleeding during surgery and should not be taken for 2 weeks prior to and 1 week after surgery.  If you should need take something for relief of minor pain, you may take acetaminophen which is found in Tylenol,m Datril, Anacin-3 and Panadol. It is not blood thinner. The products listed below are.  Do not take any of the products listed below in addition to any listed on your instruction sheet.  A.P.C or A.P.C with Codeine Codeine Phosphate Capsules #3 Ibuprofen Ridaura  ABC compound Congesprin Imuran rimadil  Advil Cope Indocin Robaxisal  Alka-Seltzer Effervescent Pain Reliever and Antacid Coricidin or Coricidin-D  Indomethacin Rufen  Alka-Seltzer plus Cold Medicine Cosprin Ketoprofen S-A-C Tablets  Anacin Analgesic Tablets or Capsules Coumadin Korlgesic Salflex  Anacin Extra Strength Analgesic tablets or capsules CP-2 Tablets Lanoril Salicylate  Anaprox Cuprimine Capsules Levenox Salocol  Anexsia-D Dalteparin Magan Salsalate  Anodynos Darvon compound Magnesium Salicylate Sine-off  Ansaid Dasin Capsules Magsal Sodium Salicylate  Anturane Depen Capsules Marnal Soma  APF Arthritis pain formula Dewitt's Pills Measurin Stanback  Argesic Dia-Gesic Meclofenamic Sulfinpyrazone  Arthritis Bayer Timed Release Aspirin Diclofenac Meclomen Sulindac  Arthritis pain formula Anacin Dicumarol Medipren Supac  Analgesic (Safety coated) Arthralgen Diffunasal Mefanamic Suprofen  Arthritis Strength Bufferin Dihydrocodeine Mepro Compound Suprol  Arthropan liquid Dopirydamole Methcarbomol with Aspirin Synalgos   ASA tablets/Enseals Disalcid Micrainin Tagament  Ascriptin Doan's Midol Talwin  Ascriptin A/D Dolene Mobidin Tanderil  Ascriptin Extra Strength Dolobid Moblgesic Ticlid  Ascriptin with Codeine Doloprin or Doloprin with Codeine Momentum Tolectin  Asperbuf Duoprin Mono-gesic Trendar  Aspergum Duradyne Motrin or Motrin IB Triminicin  Aspirin plain, buffered or enteric coated Durasal Myochrisine Trigesic  Aspirin Suppositories Easprin Nalfon Trillsate  Aspirin with Codeine Ecotrin Regular or Extra Strength Naprosyn Uracel  Atromid-S Efficin Naproxen Ursinus  Auranofin Capsules Elmiron Neocylate Vanquish  Axotal Emagrin Norgesic Verin  Azathioprine Empirin or Empirin with Codeine Normiflo Vitamin E  Azolid Emprazil Nuprin Voltaren  Bayer Aspirin plain, buffered or children's or timed BC Tablets or powders Encaprin Orgaran Warfarin Sodium  Buff-a-Comp Enoxaparin Orudis Zorpin  Buff-a-Comp with Codeine Equegesic Os-Cal-Gesic   Buffaprin Excedrin plain, buffered or Extra Strength Oxalid   Bufferin Arthritis Strength Feldene Oxphenbutazone   Bufferin plain or Extra Strength Feldene Capsules Oxycodone with Aspirin   Bufferin with Codeine Fenoprofen Fenoprofen Pabalate or Pabalate-SF   Buffets II Flogesic Panagesic   Buffinol plain or Extra Strength Florinal or Florinal with Codeine Panwarfarin   Buf-Tabs Flurbiprofen Penicillamine   Butalbital Compound Four-way cold tablets Penicillin   Butazolidin Fragmin Pepto-Bismol   Carbenicillin Geminisyn Percodan   Carna Arthritis Reliever Geopen Persantine   Carprofen Gold's salt Persistin   Chloramphenicol Goody's Phenylbutazone   Chloromycetin Haltrain Piroxlcam   Clmetidine heparin Plaquenil   Cllnoril Hyco-pap Ponstel   Clofibrate Hydroxy chloroquine Propoxyphen         Before stopping any of these medications, be sure to consult the physician who ordered them.  Some, such as Coumadin (Warfarin) are ordered to prevent or treat serious  conditions such as "deep thrombosis", "pumonary embolisms", and other heart problems.  The amount of time that you may need off of the medication may also vary with the medication and the reason for which you were taking it.  If you are taking any of these medications, please make sure you notify your pain physician before you undergo any procedures.    <  18 kg/m2 Underweight   18.5-24.9 kg/m2 Ideal body weight   25-29.9 kg/m2 Overweight Increased incidence by 20%  30-34.9 kg/m2 Obese (Class I) Increased incidence by 68%  35-39.9 kg/m2 Severe obesity (Class II) Increased incidence by 136%  >40 kg/m2 Extreme obesity (Class III) Increased incidence by 254%   BMI Readings from Last 4 Encounters:  11/21/16 37.82 kg/m  10/24/16 37.94 kg/m  08/01/16 38.94 kg/m  07/25/16 39.07 kg/m   Wt Readings from Last 4 Encounters:  11/21/16 227 lb 4.8 oz (103.1 kg)  10/24/16 228 lb (103.4 kg)  08/01/16 234 lb (106.1 kg)  07/25/16 234 lb 12.8 oz (106.5 kg)

## 2016-11-21 NOTE — Progress Notes (Deleted)
Patient's Name: Grace [name]  MRN: [id number]  Referring Provider: [name], MD  DOB: 1950-11-17  PCP: [name], MD  DOS: 11/21/2016  Note by: Vevelyn Francois NP  Service setting: Ambulatory outpatient  Specialty: Interventional Pain Management  Location: [facility] (AMB) Pain Management Facility    Patient type: Established    Primary Reason(s) for Visit: Evaluation of chronic illnesses with exacerbation, or progression (Level of risk: moderate) CC: Knee Pain (bilateral, left worse)  HPI  Grace [name] is a 66 y.o. year old, female patient, who comes today for a follow-up evaluation. She has PVC's (premature ventricular contractions); SOB (shortness of breath); COPD (chronic obstructive pulmonary disease) with chronic bronchitis ([place]); Obesity; Tachycardia; Congestive dilated cardiomyopathy ([place]); Sepsis ([place]); CAP (community acquired pneumonia); GERD (gastroesophageal reflux disease); Depression; Thoracic compression fracture (HCC); CHF (congestive heart failure), NYHA class I ([place]); COPD exacerbation ([place]); Systolic dysfunction with acute on chronic heart failure ([place]); Lumbar stenosis with neurogenic claudication; Breast cyst; Cardiomyopathy ([place]); COPD (chronic obstructive pulmonary disease) ([place]); Diabetes mellitus type 2, uncomplicated ([place]); Fibroids; Hidradenitis; Nicotine abuse; Osteoarthritis cervical spine; Osteoarthritis of hands, bilateral; Osteoarthritis of knees, bilateral; PUD (peptic ulcer disease); and Pulmonary nodule on her problem list. Ms. Poth was last seen on Visit date not found. Her primarily concern today is the Knee Pain (bilateral, left worse)  Pain Assessment: Location: Right, Left Knee Radiating:   Onset: More than a month ago Duration: Chronic pain Quality: Aching, Grimacing, Discomfort Severity: 3 /10 (self-reported pain score)  Note: Reported level is compatible with observation.                    Effect on ADL: standing or sitting too long,  bending Timing: Constant Modifying factors: medications  Further details on both, my assessment(s), as well as the proposed treatment plan, please see below. She admits that she had laminectomy in June 2018. She is now having pain in both knees.   Laboratory Chemistry  Inflammation Markers (CRP: Acute Phase) (ESR: Chronic Phase) No results found for: CRP, ESRSEDRATE               Renal Function Markers Lab Results  Component Value Date   BUN 10 10/11/2016   CREATININE 0.75 10/11/2016   GFRAA 97 10/11/2016   GFRNONAA 84 10/11/2016                 Hepatic Function Markers Lab Results  Component Value Date   AST 22 08/23/2014   ALT 15 08/23/2014   ALBUMIN 3.7 08/23/2014   ALKPHOS 83 08/23/2014                 Electrolytes Lab Results  Component Value Date   NA 140 10/11/2016   K 4.1 10/11/2016   CL 100 10/11/2016   CALCIUM 9.3 10/11/2016                 Neuropathy Markers No results found for: [id number]               Bone Pathology Markers Lab Results  Component Value Date   ALKPHOS 83 08/23/2014   CALCIUM 9.3 10/11/2016                 Coagulation Parameters Lab Results  Component Value Date   PLT 237 07/25/2016                 Cardiovascular Markers Lab Results  Component Value Date   BNP 38.0 08/22/2014   HGB  13.9 07/25/2016   HCT 42.7 07/25/2016                 Note: Lab results reviewed.  Recent Diagnostic Imaging Review  Thoracic Imaging: Thoracic MR wo contrast:  Results for orders placed during the hospital encounter of 03/14/16  MR THORACIC SPINE WO CONTRAST   Narrative CLINICAL DATA:  Bilateral buttock and leg pain with left leg numbness. Neurogenic claudication. History of thoracic compression fracture.  EXAM: MRI THORACIC AND LUMBAR SPINE WITHOUT CONTRAST  TECHNIQUE: Multiplanar and multiecho pulse sequences of the thoracic and lumbar spine were obtained without intravenous contrast.  COMPARISON:  MRI thoracic spine 07/15/2014  and CT thoracic spine 07/15/2014.  FINDINGS: MRI THORACIC SPINE FINDINGS  Alignment: Progressive kyphotic deformity in the upper thoracic spine centered at T5. There is a remote severe compression fracture of T5 with vertebral plana. Moderate retropulsion and mild canal encroachment appears relatively stable when compared to the prior thoracic spine CT scan. There is however progressive degenerative anterior subluxation of T4. Stable mild compression fractures of T4, T6 and T7. No new/acute thoracic compression fractures.  Vertebrae: Normal marrow signal. Remote compression fractures. No new/ acute fracture or bone lesion.  Cord: Grossly normal thoracic spinal cord. No cord lesions or syrinx.  Paraspinal and other soft tissues: No significant findings.  Disc levels:  Retropulsion of the severely compressed T5 vertebral body with canal encroachment and slight flattening of the thoracic spinal cord.  No significant thoracic disc protrusions or other levels of spinal or foraminal stenosis.  MRI LUMBAR SPINE FINDINGS  Segmentation: 5 lumbar type vertebral bodies. The last full intervertebral disc space is labeled L5-S1.  Alignment:  Normal  Vertebrae: Minimal endplate reactive changes at L3-4. No worrisome bone lesions or acute fracture. Remote appearing mild compression deformity of L1.  Conus medullaris: Extends to the L1 level and appears normal.  Paraspinal and other soft tissues: No significant findings  Disc levels:  L1-2:  No significant findings.  Mild to moderate facet disease.  L2-3: Disc disease and facet disease with a slight bulging annulus. Mild lateral recess encroachment bilaterally but no significant spinal or foraminal stenosis.  L3-4: Disc disease and facet disease. Bulging annulus, osteophytic ridging and short pedicles with mild bilateral lateral recess stenosis but no significant spinal or foraminal stenosis.  L4-5: Diffuse bulging annulus,  short pedicles and advanced facet disease contributing to moderate to moderately severe spinal and bilateral lateral recess stenosis. Mild foraminal stenosis bilaterally also.  L5-S1: Facet disease but no disc protrusions, spinal or foraminal stenosis.  IMPRESSION: MR THORACIC SPINE IMPRESSION  1. Remote severe compression fracture of T5 with vertebral plana. Stable retropulsion and mild canal encroachment. Slight flattening of the ventral thoracic cord. 2. Progressive kyphosis and degenerative anterior subluxation of T4. 3. No cord lesions, cord edema or syrinx.  MR LUMBAR SPINE IMPRESSION  1. Mild lateral recess encroachment bilaterally at L2-3 and L3-4. 2. Moderate to moderately severe multifactorial spinal and bilateral lateral recess stenosis at L4-5. There is also mild bilateral foraminal stenosis.   Electronically Signed   By: Marijo Sanes M.D.   On: 03/14/2016 10:15    Thoracic MR w/wo contrast:  Results for orders placed during the hospital encounter of 07/15/14  MR Thoracic Spine W Wo Contrast   Narrative CLINICAL DATA:  [place] on 07/15/2014. Bilateral lower extremity weakness.  EXAM: MRI THORACIC SPINE WITHOUT AND WITH CONTRAST  TECHNIQUE: Multiplanar and multiecho pulse sequences of the thoracic spine were obtained without and with  intravenous contrast.  CONTRAST:  16 cc MultiHance  COMPARISON:  Thoracic spine radiographs 07/02/2014 and 07/15/2014  FINDINGS: There is a vertebral plana appearance of the T5 vertebral body without obvious enhancement. This is likely a remote compression fracture. There is also a remote compression fracture of T7. Mild compression deformity of T6. There is marrow signal abnormality in the T4 vertebral body along with contrast enhancement. There is also some contrast enhancement in the upper aspect of T6. There is also significant enhancement around the vertebral bodies.  The patient has severe underlying epidural  lipomatosis and that in combination with the compression deformity and possible hematoma, edema or tumor ventrally is contributing to severe cord compression. There is no appreciable CSF identified around the cervical spinal cord extending from the midbody of T4 down to the lower aspect of T6.  No cord signal abnormality is demonstrated or abnormal cord enhancement. The other thoracic vertebral bodies are maintained. No foraminal lesions.  IMPRESSION: 1. Significant cord compression from the midbody of T4 to the bottom of T6. This may all be related to trauma with edema, fluid and hemorrhage but could not exclude underlying pathologic process such as infection or tumor. Recommend neurosurgical consultation. The patient's cord compression is compound did by a significant epidural lipomatosis. 2. Remote appearing compression fractures of T5 and T7. 3. Mild edema and enhancement in the T4 and T6 vertebral bodies could be related to trauma/reactive changes or possible underlying pathologic process.   Electronically Signed   By: Marijo Sanes M.D.   On: 07/15/2014 11:07    Thoracic CT wo contrast:  Results for orders placed during the hospital encounter of 07/15/14  CT Thoracic Spine Wo Contrast   Narrative CLINICAL DATA:  Thoracic spine pain, known fractures. Known cord compression.  EXAM: CT THORACIC SPINE WITHOUT CONTRAST  TECHNIQUE: Multidetector CT imaging of the thoracic spine was performed without intravenous contrast administration. Multiplanar CT image reconstructions were also generated.  COMPARISON:  MRI thoracic spine from [facility] Jul 15, 2014 at 0958 hours  FINDINGS: Again noted is T5 vertebral plana with vacuum disc and intraosseous air. Moderate T7 compression fracture with approximately 50% anterior wedging. Mild T6 superior endplate compression fracture with less than 20% superior endplate height loss. The remaining thoracic vertebral bodies intact.  Accentuated thoracic kyphosis at T5. No malalignment. Osteopenia without destructive bony lesions.  Prominent posterior epidural fat. Included prevertebral and paraspinal soft tissues are nonsuspicious. Moderate calcific atherosclerosis of the included thoracic aorta.  Retropulsed bony fragments at T5 results in very mild osseous canal stenosis. Severe RIGHT, moderate LEFT T5-6 neural foraminal narrowing. Mild LEFT T4-5 neural foraminal narrowing.  IMPRESSION: T5 vertebral plana, with benign imaging features. Moderate T7 and mild T6 compression fractures. Osteopenia suggesting fracture insufficiency etiology. Accentuated upper thoracic kyphosis without malalignment.  Very mild osseous canal stenosis at T5. Severe RIGHT, moderate LEFT T5-6 neural foraminal narrowing. Mild LEFT T4-5 neural foraminal narrowing.   Electronically Signed   By: Elon Alas M.D.   On: 07/15/2014 23:37    Thoracic DG 2-3 views:  Results for orders placed during the hospital encounter of 07/15/14  DG Thoracic Spine 2 View   Narrative CLINICAL DATA:  Fall this morning now with back pain. Known T5 and T7 fracture post fall 1 week prior.  EXAM: THORACIC SPINE - 2 VIEW  COMPARISON:  Thoracic spine radiographs 07/02/2014  FINDINGS: Compression deformities of T5 and T7 are unchanged compared to prior exam. No new fracture. The alignment is maintained. Mild  endplate spurring and disc space narrowing in the mid lower thoracic spine. No paravertebral soft tissue abnormality.  IMPRESSION: Unchanged compression deformities of T5 and T7.  No acute fracture.   Electronically Signed   By: Jeb Levering M.D.   On: 07/15/2014 06:35    Lumbosacral Imaging: Lumbar MR wo contrast:  Results for orders placed during the hospital encounter of 03/14/16  MR LUMBAR SPINE WO CONTRAST   Narrative CLINICAL DATA:  Bilateral buttock and leg pain with left leg numbness. Neurogenic claudication. History of  thoracic compression fracture.  EXAM: MRI THORACIC AND LUMBAR SPINE WITHOUT CONTRAST  TECHNIQUE: Multiplanar and multiecho pulse sequences of the thoracic and lumbar spine were obtained without intravenous contrast.  COMPARISON:  MRI thoracic spine 07/15/2014 and CT thoracic spine 07/15/2014.  FINDINGS: MRI THORACIC SPINE FINDINGS  Alignment: Progressive kyphotic deformity in the upper thoracic spine centered at T5. There is a remote severe compression fracture of T5 with vertebral plana. Moderate retropulsion and mild canal encroachment appears relatively stable when compared to the prior thoracic spine CT scan. There is however progressive degenerative anterior subluxation of T4. Stable mild compression fractures of T4, T6 and T7. No new/acute thoracic compression fractures.  Vertebrae: Normal marrow signal. Remote compression fractures. No new/ acute fracture or bone lesion.  Cord: Grossly normal thoracic spinal cord. No cord lesions or syrinx.  Paraspinal and other soft tissues: No significant findings.  Disc levels:  Retropulsion of the severely compressed T5 vertebral body with canal encroachment and slight flattening of the thoracic spinal cord.  No significant thoracic disc protrusions or other levels of spinal or foraminal stenosis.  MRI LUMBAR SPINE FINDINGS  Segmentation: 5 lumbar type vertebral bodies. The last full intervertebral disc space is labeled L5-S1.  Alignment:  Normal  Vertebrae: Minimal endplate reactive changes at L3-4. No worrisome bone lesions or acute fracture. Remote appearing mild compression deformity of L1.  Conus medullaris: Extends to the L1 level and appears normal.  Paraspinal and other soft tissues: No significant findings  Disc levels:  L1-2:  No significant findings.  Mild to moderate facet disease.  L2-3: Disc disease and facet disease with a slight bulging annulus. Mild lateral recess encroachment bilaterally but no  significant spinal or foraminal stenosis.  L3-4: Disc disease and facet disease. Bulging annulus, osteophytic ridging and short pedicles with mild bilateral lateral recess stenosis but no significant spinal or foraminal stenosis.  L4-5: Diffuse bulging annulus, short pedicles and advanced facet disease contributing to moderate to moderately severe spinal and bilateral lateral recess stenosis. Mild foraminal stenosis bilaterally also.  L5-S1: Facet disease but no disc protrusions, spinal or foraminal stenosis.  IMPRESSION: MR THORACIC SPINE IMPRESSION  1. Remote severe compression fracture of T5 with vertebral plana. Stable retropulsion and mild canal encroachment. Slight flattening of the ventral thoracic cord. 2. Progressive kyphosis and degenerative anterior subluxation of T4. 3. No cord lesions, cord edema or syrinx.  MR LUMBAR SPINE IMPRESSION  1. Mild lateral recess encroachment bilaterally at L2-3 and L3-4. 2. Moderate to moderately severe multifactorial spinal and bilateral lateral recess stenosis at L4-5. There is also mild bilateral foraminal stenosis.   Electronically Signed   By: Marijo Sanes M.D.   On: 03/14/2016 10:15    Lumbar DG 1V:  Results for orders placed during the hospital encounter of 08/01/16  DG Lumbar Spine 1 View   Narrative CLINICAL DATA:  Elective surgery.  EXAM: LUMBAR SPINE - 1 VIEW  COMPARISON:  MRI of 03/14/2016  FINDINGS: Single  lateral view labeled 0833 hours. This demonstrates a surgical device projecting posterior to the lumbosacral junction and inferior aspect of L5. Degenerative disc disease is most significant at L3-4 and L5-S1. Mild superior endplate compression deformity at L1 which is not significantly changed.  IMPRESSION: Intraoperative localization of L5-S1.   Electronically Signed   By: Abigail Miyamoto M.D.   On: 08/01/2016 10:09     Complexity Note: Imaging results reviewed. Results shared with Ms. Payment,  using Layman's terms.                         Meds   Current Outpatient Prescriptions:  .  albuterol (PROVENTIL HFA;VENTOLIN HFA) 108 (90 BASE) MCG/ACT inhaler, Inhale 2 puffs into the lungs every 6 (six) hours as needed for shortness of breath. , Disp: , Rfl:  .  alendronate (FOSAMAX) 70 MG tablet, Take 70 mg by mouth every Monday., Disp: , Rfl:  .  ARIPiprazole (ABILIFY) 5 MG tablet, Take 5 mg by mouth daily., Disp: , Rfl:  .  diltiazem (CARDIZEM) 30 MG tablet, Take 1 tablet (30 mg total) by mouth 3 (three) times daily as needed., Disp: 90 tablet, Rfl: 3 .  DULoxetine (CYMBALTA) 60 MG capsule, Take 60 mg by mouth daily., Disp: , Rfl:  .  ibuprofen (ADVIL,MOTRIN) 200 MG tablet, Take 800 mg by mouth every 8 (eight) hours as needed (for pain.)., Disp: , Rfl:  .  LORazepam (ATIVAN) 0.5 MG tablet, Take 0.5 mg by mouth daily as needed for anxiety. , Disp: , Rfl:  .  metoprolol succinate (TOPROL-XL) 100 MG 24 hr tablet, TAKE ONE TABLET BY MOUTH ONCE DAILY, Disp: 30 tablet, Rfl: 0 .  modafinil (PROVIGIL) 200 MG tablet, Take 200 mg by mouth daily as needed (for alertness 3rd shift). , Disp: , Rfl:  .  oxyCODONE-acetaminophen (PERCOCET/ROXICET) 5-325 MG tablet, Take 1 tablet by mouth every 6 (six) hours as needed. For pain., Disp: , Rfl: 0 .  sacubitril-valsartan (ENTRESTO) 49-51 MG, Take 1 tablet by mouth 2 (two) times daily., Disp: 60 tablet, Rfl: 6  ROS  Constitutional: Denies any fever or chills Gastrointestinal: No reported hemesis, hematochezia, vomiting, or acute GI distress Musculoskeletal: Denies any acute onset joint swelling, redness, loss of ROM, or weakness Neurological: No reported episodes of acute onset apraxia, aphasia, dysarthria, agnosia, amnesia, paralysis, loss of coordination, or loss of consciousness  Allergies  Ms. Pair is allergic to iodine.   [place]  Drug: Ms. Casco  reports that she does not use drugs. Alcohol:  reports that she does not drink alcohol. Tobacco:   reports that she has quit smoking. Her smoking use included Cigarettes. She has a 25.00 pack-year smoking history. She has never used smokeless tobacco. Medical:  has a past medical history of Anxiety; Arthritis; Asthma; Cardiomyopathy ([place]); COPD (chronic obstructive pulmonary disease) ([place]); Depression; Dyspnea; GERD (gastroesophageal reflux disease); Hyperlipidemia; Hypertension; Pneumonia; PVC's (premature ventricular contractions); and Spinal cord injury at T7-T12 level ([place]). Surgical: Ms. Klahr  has a past surgical history that includes Cardiac catheterization; Cesarean section; Appendectomy; Cataract extraction; Colonoscopy; Lumbar laminectomy/decompression microdiscectomy (Left, 08/01/2016); and Laminotomy. Family: family history includes Heart attack (age of onset: 26) in her father.  Constitutional Exam  General appearance: Well nourished, well developed, and well hydrated. In no apparent acute distress Vitals:   11/21/16 0944  BP: (!) 100/55  Pulse: 68  Resp: 16  Temp: 98.1 F (36.7 C)  SpO2: 92%  Weight: 215 lb (97.5 kg)  Height:  $'5\' 5"'z$  (1.651 m)   BMI Assessment: Estimated body mass index is 35.78 kg/m as calculated from the following:   Height as of this encounter: '5\' 5"'$  (1.651 m).   Weight as of this encounter: 215 lb (97.5 kg).  BMI interpretation table: BMI level Category Range association with higher incidence of chronic pain  <18 kg/m2 Underweight   18.5-24.9 kg/m2 Ideal body weight   25-29.9 kg/m2 Overweight Increased incidence by 20%  30-34.9 kg/m2 Obese (Class I) Increased incidence by 68%  35-39.9 kg/m2 Severe obesity (Class II) Increased incidence by 136%  >40 kg/m2 Extreme obesity (Class III) Increased incidence by 254%   BMI Readings from Last 4 Encounters:  11/21/16 35.78 kg/m  10/24/16 37.94 kg/m  08/01/16 38.94 kg/m  07/25/16 39.07 kg/m   Wt Readings from Last 4 Encounters:  11/21/16 215 lb (97.5 kg)  10/24/16 228 lb (103.4 kg)  08/01/16 234  lb (106.1 kg)  07/25/16 234 lb 12.8 oz (106.5 kg)  Psych/Mental status: Alert, oriented x 3 (person, place, & time)       Eyes: PERLA Respiratory: No evidence of acute respiratory distress  Cervical Spine Area Exam  Skin & Axial Inspection: No masses, redness, edema, swelling, or associated skin lesions Alignment: Symmetrical Functional ROM: Unrestricted ROM      Stability: No instability detected Muscle Tone/Strength: Functionally intact. No obvious neuro-muscular anomalies detected. Sensory (Neurological): Unimpaired Palpation: No palpable anomalies              Upper Extremity (UE) Exam    Side: Right upper extremity  Side: Left upper extremity  Skin & Extremity Inspection: Skin color, temperature, and hair growth are WNL. No peripheral edema or cyanosis. No masses, redness, swelling, asymmetry, or associated skin lesions. No contractures.  Skin & Extremity Inspection: Skin color, temperature, and hair growth are WNL. No peripheral edema or cyanosis. No masses, redness, swelling, asymmetry, or associated skin lesions. No contractures.  Functional ROM: Unrestricted ROM          Functional ROM: Unrestricted ROM          Muscle Tone/Strength: Functionally intact. No obvious neuro-muscular anomalies detected.  Muscle Tone/Strength: Functionally intact. No obvious neuro-muscular anomalies detected.  Sensory (Neurological): Unimpaired          Sensory (Neurological): Unimpaired          Palpation: No palpable anomalies              Palpation: No palpable anomalies              Specialized Test(s): Deferred         Specialized Test(s): Deferred          Thoracic Spine Area Exam  Skin & Axial Inspection: No masses, redness, or swelling Alignment: Symmetrical Functional ROM: Unrestricted ROM Stability: No instability detected Muscle Tone/Strength: Functionally intact. No obvious neuro-muscular anomalies detected. Sensory (Neurological): Unimpaired Muscle strength & Tone: No palpable  anomalies  Lumbar Spine Area Exam  Skin & Axial Inspection: No masses, redness, or swelling Alignment: Symmetrical Functional ROM: Unrestricted ROM      Stability: No instability detected Muscle Tone/Strength: Functionally intact. No obvious neuro-muscular anomalies detected. Sensory (Neurological): Unimpaired Palpation: No palpable anomalies       Provocative Tests: Lumbar Hyperextension and rotation test: evaluation deferred today       Lumbar Lateral bending test: evaluation deferred today       Patrick's Maneuver: evaluation deferred today  Gait & Posture Assessment  Ambulation: Unassisted Gait: Relatively normal for age and body habitus Posture: WNL   Lower Extremity Exam    Side: Right lower extremity  Side: Left lower extremity  Skin & Extremity Inspection: Skin color, temperature, and hair growth are WNL. No peripheral edema or cyanosis. No masses, redness, swelling, asymmetry, or associated skin lesions. No contractures.  Skin & Extremity Inspection: Skin color, temperature, and hair growth are WNL. No peripheral edema or cyanosis. No masses, redness, swelling, asymmetry, or associated skin lesions. No contractures.  Functional ROM: Unrestricted ROM          Functional ROM: Unrestricted ROM          Muscle Tone/Strength: Functionally intact. No obvious neuro-muscular anomalies detected.  Muscle Tone/Strength: Functionally intact. No obvious neuro-muscular anomalies detected.  Sensory (Neurological): Unimpaired  Sensory (Neurological): Unimpaired  Palpation: Complains of area being tender to palpation  Palpation: Complains of area being tender to palpation   Assessment  Primary Diagnosis & Pertinent Problem List: There were no encounter diagnoses.  Status Diagnosis  Controlled Controlled Controlled No diagnosis found.  Problems updated and reviewed during this visit: Problem  Breast Cyst  Cardiomyopathy (Hcc)  Copd (Chronic Obstructive Pulmonary  Disease) (Hcc)   Overview:  stage III    Diabetes Mellitus Type 2, Uncomplicated (Hcc)  Fibroids  Hidradenitis  Nicotine Abuse  Osteoarthritis Cervical Spine  Osteoarthritis of Hands, Bilateral  Osteoarthritis of Knees, Bilateral  Pud (Peptic Ulcer Disease)  Pulmonary Nodule   Plan of Care  Pharmacotherapy (Medications Ordered): No orders of the defined types were placed in this encounter.  New Prescriptions   No medications on file   Medications administered today: Ms. Heiner had no medications administered during this visit. Lab-work, procedure(s), and/or referral(s): No orders of the defined types were placed in this encounter.  Imaging and/or referral(s): None  Interventional therapies: Planned, scheduled, and/or pending:   Not at this time.   Considering:   ***   Palliative PRN treatment(s):   ***   Provider-requested follow-up: Return for Procedure(NS), w/ Dr. Dossie Arbour, (ASAP).  Future Appointments Date Time Provider [place]  11/28/2016 3:00 PM [facility] [place] [id number]   Primary Care Physician: [name], MD Location: [facility] Pain Management Facility Note by: Vevelyn Francois NP Date: 11/21/2016; Time: 10:22 AM  Pain Score Disclaimer: We use the NRS-11 scale. This is a self-reported, subjective measurement of pain severity with only modest accuracy. It is used primarily to identify changes within a particular patient. It must be understood that outpatient pain scales are significantly less accurate that those used for research, where they can be applied under ideal controlled circumstances with minimal exposure to variables. In reality, the score is likely to be a combination of pain intensity and pain affect, where pain affect describes the degree of emotional arousal or changes in action readiness caused by the sensory experience of pain. Factors such as social and work situation, setting, emotional state, anxiety levels,  expectation, and prior pain experience may influence pain perception and show large inter-individual differences that may also be affected by time variables.  Patient instructions provided during this appointment: There are no Patient Instructions on file for this visit.

## 2016-11-21 NOTE — Progress Notes (Signed)
Safety precautions to be maintained throughout the outpatient stay will include: orient to surroundings, keep bed in low position, maintain call bell within reach at all times, provide assistance with transfer out of bed and ambulation.  

## 2016-11-21 NOTE — Progress Notes (Signed)
Patient's Name: Grace [name]  MRN: [id number]  Referring Provider: [name], MD  DOB: January 30, 1951  PCP: [name], MD  DOS: 11/21/2016  Note by: Dionisio David NP  Service setting: Ambulatory outpatient  Specialty: Interventional Pain Management  Location: [facility] (AMB) Pain Management Facility    Patient type: New Patient    Primary Reason(s) for Visit: Initial Patient Evaluation CC: Knee Pain (bilateral, left worse)  HPI  Grace [name] is a 66 y.o. year old, female patient, who comes today for an evaluation. She has PVC's (premature ventricular contractions); SOB (shortness of breath); COPD (chronic obstructive pulmonary disease) with chronic bronchitis ([place]); Obesity; Tachycardia; Congestive dilated cardiomyopathy ([place]); Sepsis ([place]); CAP (community acquired pneumonia); GERD (gastroesophageal reflux disease); Depression; Thoracic compression fracture (HCC); CHF (congestive heart failure), NYHA class I ([place]); COPD exacerbation ([place]); Systolic dysfunction with acute on chronic heart failure ([place]); Lumbar stenosis with neurogenic claudication; Breast cyst; Cardiomyopathy ([place]); COPD (chronic obstructive pulmonary disease) ([place]); Diabetes mellitus type 2, uncomplicated ([place]); Fibroids; Hidradenitis; Nicotine abuse; Osteoarthritis cervical spine; Osteoarthritis of hands, bilateral; Osteoarthritis of knees, bilateral; PUD (peptic ulcer disease); and Pulmonary nodule on her problem list.. Her primarily concern today is the Knee Pain (bilateral, left worse)  Pain Assessment: Location: Right, Left Knee Radiating:   Onset: More than a month ago Duration: Chronic pain Quality: Aching, Grimacing, Discomfort Severity: 3 /10 (self-reported pain score)  Note: Reported level is compatible with observation.                    Effect on ADL: standing or sitting too long, bending Timing: Constant Modifying factors: medications  Onset and Duration: Gradual and Present longer than 3 months Cause of pain:  Unknown Severity: Getting worse, NAS-11 at its worse: 7/10, NAS-11 at its best: 0/10, NAS-11 now: 3/10 and NAS-11 on the average: 5/10 Timing: Morning, Night, During activity or exercise and After a period of immobility Aggravating Factors: Bending, Climbing, Kneeling, Motion, Prolonged sitting, Prolonged standing, Squatting and Twisting Alleviating Factors: Stretching, Cold packs, Resting, Sitting and Chiropractic manipulations Associated Problems: Depression, Fatigue, Weakness and Pain that does not allow patient to sleep Quality of Pain: Aching and Heavy Previous Examinations or Tests: CT scan, MRI scan and Chiropractic evaluation Previous Treatments: Chiropractic manipulations and Epidural steroid injections  The patient comes into the clinics today for the first time for a chronic pain management evaluation. According too the patient her primary area of pain is in her knees. She is having increased pain and weakness. She admits that this is hindering her from completing her physical therapy after her laminectomy June 2018. She has had previous interventional therapy in the past which was effective for greater than 1 year. She denies any recent images  Today I took the time to provide the patient with information regarding this pain practice. The patient was informed that the practice is divided into two sections: an interventional pain management section, as well as a completely separate and distinct medication management section. I explained that there are procedure days for interventional therapies, and evaluation days for follow-ups and medication management. Because of the amount of documentation required during both, they are kept separated. This means that there is the possibility that he may be scheduled for a procedure on one day, and medication management the next. I have also informed erthat because of staffing and facility limitations, this practice will no longer take patients for  medication management only. To illustrate the reasons for this, I gave the patient the  example of surgeons, and how inappropriate it would be to refer a patient to her care, just to write for the post-surgical antibiotics on a surgery done by a different surgeon.   Because interventional pain management is part of the board-certified specialty for the doctors, the patient was informed that joining this practice means that they are open to any and all interventional therapies. I made it clear that this does not mean that they will be forced to have any procedures done. What this means is that I believe interventional therapies to be essential part of the diagnosis and proper management of chronic pain conditions. Therefore, patients not interested in these interventional alternatives will be better served under the care of a different practitioner.  The patient was also made aware of my Comprehensive Pain Management Safety Guidelines where by joining this practice, they limit all of their nerve blocks and joint injections to those done by our practice, for as long as we are retained to manage their care. Historic Controlled Substance Pharmacotherapy Review  Current opioid analgesics: oxycodone/acetaminophen 5/325 mg every 6 hours (prescribed by neurosurgeon) MME/day: 30 mg/day Medications: The patient did not bring the medication(s) to the appointment, as requested in our "New Patient Package" Pharmacodynamics: Desired effects: Analgesia: The patient reports >50% benefit. Reported improvement in function: The patient reports medication allows her to accomplish basic ADLs. Clinically meaningful improvement in function (CMIF): Sustained CMIF goals met Perceived effectiveness: Described as relatively effective, allowing for increase in activities of daily living (ADL) Undesirable effects: Side-effects or Adverse reactions: None reported Historical Monitoring: The patient  reports that she does not use  drugs. List of all UDS Test(s): No results found for: MDMA, COCAINSCRNUR, PCPSCRNUR, PCPQUANT, CANNABQUANT, THCU, [place] List of all Serum Drug Screening Test(s):  No results found for: AMPHSCRSER, BARBSCRSER, BENZOSCRSER, COCAINSCRSER, PCPSCRSER, PCPQUANT, THCSCRSER, CANNABQUANT, OPIATESCRSER, OXYSCRSER, PROPOXSCRSER Historical Background Evaluation: Long PDMP: Six (6) year initial data search conducted.             [place] Department of public safety, offender search: Editor, commissioning Information) Non-contributory Risk Assessment Profile: Aberrant behavior: None observed or detected today Risk factors for fatal opioid overdose: None identified today Fatal overdose hazard ratio (HR): Calculation deferred Non-fatal overdose hazard ratio (HR): Calculation deferred Risk of opioid abuse or dependence: 0.7-3.0% with doses ? 36 MME/day and 6.1-26% with doses ? 120 MME/day. Substance use disorder (SUD) risk level: Pending results of Medical Psychology Evaluation for SUD Opioid risk tool (ORT) (Total Score): 6  ORT Scoring interpretation table:  Score <3 = Low Risk for SUD  Score between 4-7 = Moderate Risk for SUD  Score >8 = High Risk for Opioid Abuse   PHQ-2 Depression Scale:  Total score: 0  PHQ-2 Scoring interpretation table: (Score and probability of major depressive disorder)  Score 0 = No depression  Score 1 = 15.4% Probability  Score 2 = 21.1% Probability  Score 3 = 38.4% Probability  Score 4 = 45.5% Probability  Score 5 = 56.4% Probability  Score 6 = 78.6% Probability   PHQ-9 Depression Scale:  Total score: 0  PHQ-9 Scoring interpretation table:  Score 0-4 = No depression  Score 5-9 = Mild depression  Score 10-14 = Moderate depression  Score 15-19 = Moderately severe depression  Score 20-27 = Severe depression (2.4 times higher risk of SUD and 2.89 times higher risk of overuse)   Pharmacologic Plan: Pending ordered tests and/or consults  Meds  The patient has a current medication list  which includes  the following prescription(s): albuterol, alendronate, aripiprazole, diltiazem, duloxetine, ibuprofen, lorazepam, metoprolol succinate, modafinil, oxycodone-acetaminophen, and sacubitril-valsartan.  Current Outpatient Prescriptions on File Prior to Visit  Medication Sig  . albuterol (PROVENTIL HFA;VENTOLIN HFA) 108 (90 BASE) MCG/ACT inhaler Inhale 2 puffs into the lungs every 6 (six) hours as needed for shortness of breath.   Marland Kitchen alendronate (FOSAMAX) 70 MG tablet Take 70 mg by mouth every Monday.  . ARIPiprazole (ABILIFY) 5 MG tablet Take 5 mg by mouth daily.  Marland Kitchen diltiazem (CARDIZEM) 30 MG tablet Take 1 tablet (30 mg total) by mouth 3 (three) times daily as needed.  . DULoxetine (CYMBALTA) 60 MG capsule Take 60 mg by mouth daily.  Marland Kitchen ibuprofen (ADVIL,MOTRIN) 200 MG tablet Take 800 mg by mouth every 8 (eight) hours as needed (for pain.).  Marland Kitchen LORazepam (ATIVAN) 0.5 MG tablet Take 0.5 mg by mouth daily as needed for anxiety.   . metoprolol succinate (TOPROL-XL) 100 MG 24 hr tablet TAKE ONE TABLET BY MOUTH ONCE DAILY  . modafinil (PROVIGIL) 200 MG tablet Take 200 mg by mouth daily as needed (for alertness 3rd shift).   Marland Kitchen oxyCODONE-acetaminophen (PERCOCET/ROXICET) 5-325 MG tablet Take 1 tablet by mouth every 6 (six) hours as needed. For pain.  . sacubitril-valsartan (ENTRESTO) 49-51 MG Take 1 tablet by mouth 2 (two) times daily.   No current facility-administered medications on file prior to visit.    Imaging Review  Thoracic Imaging: Thoracic MR wo contrast:  Results for orders placed during the hospital encounter of 03/14/16  MR THORACIC SPINE WO CONTRAST   Narrative CLINICAL DATA:  Bilateral buttock and leg pain with left leg numbness. Neurogenic claudication. History of thoracic compression fracture.  EXAM: MRI THORACIC AND LUMBAR SPINE WITHOUT CONTRAST  TECHNIQUE: Multiplanar and multiecho pulse sequences of the thoracic and lumbar spine were obtained without intravenous  contrast.  COMPARISON:  MRI thoracic spine 07/15/2014 and CT thoracic spine 07/15/2014.  FINDINGS: MRI THORACIC SPINE FINDINGS  Alignment: Progressive kyphotic deformity in the upper thoracic spine centered at T5. There is a remote severe compression fracture of T5 with vertebral plana. Moderate retropulsion and mild canal encroachment appears relatively stable when compared to the prior thoracic spine CT scan. There is however progressive degenerative anterior subluxation of T4. Stable mild compression fractures of T4, T6 and T7. No new/acute thoracic compression fractures.  Vertebrae: Normal marrow signal. Remote compression fractures. No new/ acute fracture or bone lesion.  Cord: Grossly normal thoracic spinal cord. No cord lesions or syrinx.  Paraspinal and other soft tissues: No significant findings.  Disc levels:  Retropulsion of the severely compressed T5 vertebral body with canal encroachment and slight flattening of the thoracic spinal cord.  No significant thoracic disc protrusions or other levels of spinal or foraminal stenosis.  MRI LUMBAR SPINE FINDINGS  Segmentation: 5 lumbar type vertebral bodies. The last full intervertebral disc space is labeled L5-S1.  Alignment:  Normal  Vertebrae: Minimal endplate reactive changes at L3-4. No worrisome bone lesions or acute fracture. Remote appearing mild compression deformity of L1.  Conus medullaris: Extends to the L1 level and appears normal.  Paraspinal and other soft tissues: No significant findings  Disc levels:  L1-2:  No significant findings.  Mild to moderate facet disease.  L2-3: Disc disease and facet disease with a slight bulging annulus. Mild lateral recess encroachment bilaterally but no significant spinal or foraminal stenosis.  L3-4: Disc disease and facet disease. Bulging annulus, osteophytic ridging and short pedicles with mild bilateral lateral recess stenosis  but no significant spinal or  foraminal stenosis.  L4-5: Diffuse bulging annulus, short pedicles and advanced facet disease contributing to moderate to moderately severe spinal and bilateral lateral recess stenosis. Mild foraminal stenosis bilaterally also.  L5-S1: Facet disease but no disc protrusions, spinal or foraminal stenosis.  IMPRESSION: MR THORACIC SPINE IMPRESSION  1. Remote severe compression fracture of T5 with vertebral plana. Stable retropulsion and mild canal encroachment. Slight flattening of the ventral thoracic cord. 2. Progressive kyphosis and degenerative anterior subluxation of T4. 3. No cord lesions, cord edema or syrinx.  MR LUMBAR SPINE IMPRESSION  1. Mild lateral recess encroachment bilaterally at L2-3 and L3-4. 2. Moderate to moderately severe multifactorial spinal and bilateral lateral recess stenosis at L4-5. There is also mild bilateral foraminal stenosis.   Electronically Signed   By: Marijo Sanes M.D.   On: 03/14/2016 10:15     Thoracic MR w/wo contrast:  Results for orders placed during the hospital encounter of 07/15/14  MR Thoracic Spine W Wo Contrast   Narrative CLINICAL DATA:  [place] on 07/15/2014. Bilateral lower extremity weakness.  EXAM: MRI THORACIC SPINE WITHOUT AND WITH CONTRAST  TECHNIQUE: Multiplanar and multiecho pulse sequences of the thoracic spine were obtained without and with intravenous contrast.  CONTRAST:  16 cc MultiHance  COMPARISON:  Thoracic spine radiographs 07/02/2014 and 07/15/2014  FINDINGS: There is a vertebral plana appearance of the T5 vertebral body without obvious enhancement. This is likely a remote compression fracture. There is also a remote compression fracture of T7. Mild compression deformity of T6. There is marrow signal abnormality in the T4 vertebral body along with contrast enhancement. There is also some contrast enhancement in the upper aspect of T6. There is also significant enhancement around the vertebral  bodies.  The patient has severe underlying epidural lipomatosis and that in combination with the compression deformity and possible hematoma, edema or tumor ventrally is contributing to severe cord compression. There is no appreciable CSF identified around the cervical spinal cord extending from the midbody of T4 down to the lower aspect of T6.  No cord signal abnormality is demonstrated or abnormal cord enhancement. The other thoracic vertebral bodies are maintained. No foraminal lesions.  IMPRESSION: 1. Significant cord compression from the midbody of T4 to the bottom of T6. This may all be related to trauma with edema, fluid and hemorrhage but could not exclude underlying pathologic process such as infection or tumor. Recommend neurosurgical consultation. The patient's cord compression is compound did by a significant epidural lipomatosis. 2. Remote appearing compression fractures of T5 and T7. 3. Mild edema and enhancement in the T4 and T6 vertebral bodies could be related to trauma/reactive changes or possible underlying pathologic process.   Electronically Signed   By: Marijo Sanes M.D.   On: 07/15/2014 11:07    Thoracic CT wo contrast:  Results for orders placed during the hospital encounter of 07/15/14  CT Thoracic Spine Wo Contrast   Narrative CLINICAL DATA:  Thoracic spine pain, known fractures. Known cord compression.  EXAM: CT THORACIC SPINE WITHOUT CONTRAST  TECHNIQUE: Multidetector CT imaging of the thoracic spine was performed without intravenous contrast administration. Multiplanar CT image reconstructions were also generated.  COMPARISON:  MRI thoracic spine from [facility] Jul 15, 2014 at 0958 hours  FINDINGS: Again noted is T5 vertebral plana with vacuum disc and intraosseous air. Moderate T7 compression fracture with approximately 50% anterior wedging. Mild T6 superior endplate compression fracture with less than 20% superior endplate height loss. The  remaining  thoracic vertebral bodies intact. Accentuated thoracic kyphosis at T5. No malalignment. Osteopenia without destructive bony lesions.  Prominent posterior epidural fat. Included prevertebral and paraspinal soft tissues are nonsuspicious. Moderate calcific atherosclerosis of the included thoracic aorta.  Retropulsed bony fragments at T5 results in very mild osseous canal stenosis. Severe RIGHT, moderate LEFT T5-6 neural foraminal narrowing. Mild LEFT T4-5 neural foraminal narrowing.  IMPRESSION: T5 vertebral plana, with benign imaging features. Moderate T7 and mild T6 compression fractures. Osteopenia suggesting fracture insufficiency etiology. Accentuated upper thoracic kyphosis without malalignment.  Very mild osseous canal stenosis at T5. Severe RIGHT, moderate LEFT T5-6 neural foraminal narrowing. Mild LEFT T4-5 neural foraminal narrowing.   Electronically Signed   By: Awilda Metro M.D.   On: 07/15/2014 23:37    Thoracic DG 2-3 views:  Results for orders placed during the hospital encounter of 07/15/14  DG Thoracic Spine 2 View   Narrative CLINICAL DATA:  Fall this morning now with back pain. Known T5 and T7 fracture post fall 1 week prior.  EXAM: THORACIC SPINE - 2 VIEW  COMPARISON:  Thoracic spine radiographs 07/02/2014  FINDINGS: Compression deformities of T5 and T7 are unchanged compared to prior exam. No new fracture. The alignment is maintained. Mild endplate spurring and disc space narrowing in the mid lower thoracic spine. No paravertebral soft tissue abnormality.  IMPRESSION: Unchanged compression deformities of T5 and T7.  No acute fracture.   Electronically Signed   By: Rubye Oaks M.D.   On: 07/15/2014 06:35   Lumbosacral Imaging: Lumbar MR wo contrast:  Results for orders placed during the hospital encounter of 03/14/16  MR LUMBAR SPINE WO CONTRAST   Narrative CLINICAL DATA:  Bilateral buttock and leg pain with left  leg numbness. Neurogenic claudication. History of thoracic compression fracture.  EXAM: MRI THORACIC AND LUMBAR SPINE WITHOUT CONTRAST  TECHNIQUE: Multiplanar and multiecho pulse sequences of the thoracic and lumbar spine were obtained without intravenous contrast.  COMPARISON:  MRI thoracic spine 07/15/2014 and CT thoracic spine 07/15/2014.  FINDINGS: MRI THORACIC SPINE FINDINGS  Alignment: Progressive kyphotic deformity in the upper thoracic spine centered at T5. There is a remote severe compression fracture of T5 with vertebral plana. Moderate retropulsion and mild canal encroachment appears relatively stable when compared to the prior thoracic spine CT scan. There is however progressive degenerative anterior subluxation of T4. Stable mild compression fractures of T4, T6 and T7. No new/acute thoracic compression fractures.  Vertebrae: Normal marrow signal. Remote compression fractures. No new/ acute fracture or bone lesion.  Cord: Grossly normal thoracic spinal cord. No cord lesions or syrinx.  Paraspinal and other soft tissues: No significant findings.  Disc levels:  Retropulsion of the severely compressed T5 vertebral body with canal encroachment and slight flattening of the thoracic spinal cord.  No significant thoracic disc protrusions or other levels of spinal or foraminal stenosis.  MRI LUMBAR SPINE FINDINGS  Segmentation: 5 lumbar type vertebral bodies. The last full intervertebral disc space is labeled L5-S1.  Alignment:  Normal  Vertebrae: Minimal endplate reactive changes at L3-4. No worrisome bone lesions or acute fracture. Remote appearing mild compression deformity of L1.  Conus medullaris: Extends to the L1 level and appears normal.  Paraspinal and other soft tissues: No significant findings  Disc levels:  L1-2:  No significant findings.  Mild to moderate facet disease.  L2-3: Disc disease and facet disease with a slight bulging  annulus. Mild lateral recess encroachment bilaterally but no significant spinal or foraminal stenosis.  L3-4: Disc  disease and facet disease. Bulging annulus, osteophytic ridging and short pedicles with mild bilateral lateral recess stenosis but no significant spinal or foraminal stenosis.  L4-5: Diffuse bulging annulus, short pedicles and advanced facet disease contributing to moderate to moderately severe spinal and bilateral lateral recess stenosis. Mild foraminal stenosis bilaterally also.  L5-S1: Facet disease but no disc protrusions, spinal or foraminal stenosis.  IMPRESSION: MR THORACIC SPINE IMPRESSION  1. Remote severe compression fracture of T5 with vertebral plana. Stable retropulsion and mild canal encroachment. Slight flattening of the ventral thoracic cord. 2. Progressive kyphosis and degenerative anterior subluxation of T4. 3. No cord lesions, cord edema or syrinx.  MR LUMBAR SPINE IMPRESSION  1. Mild lateral recess encroachment bilaterally at L2-3 and L3-4. 2. Moderate to moderately severe multifactorial spinal and bilateral lateral recess stenosis at L4-5. There is also mild bilateral foraminal stenosis.   Electronically Signed   By: Marijo Sanes M.D.   On: 03/14/2016 10:15   Lumbar DG 1V:  Results for orders placed during the hospital encounter of 08/01/16  DG Lumbar Spine 1 View   Narrative CLINICAL DATA:  Elective surgery.  EXAM: LUMBAR SPINE - 1 VIEW  COMPARISON:  MRI of 03/14/2016  FINDINGS: Single lateral view labeled 0833 hours. This demonstrates a surgical device projecting posterior to the lumbosacral junction and inferior aspect of L5. Degenerative disc disease is most significant at L3-4 and L5-S1. Mild superior endplate compression deformity at L1 which is not significantly changed.  IMPRESSION: Intraoperative localization of L5-S1.   Electronically Signed   By: Abigail Miyamoto M.D.   On: 08/01/2016 10:09    Note: Available  results from prior imaging studies were reviewed.        ROS  Cardiovascular History: Heart trouble, Heart failure and Heart catheterization Pulmonary or Respiratory History: Difficulty blowing air out (Emphysema) and Shortness of breath Neurological History: No reported neurological signs or symptoms such as seizures, abnormal skin sensations, urinary and/or fecal incontinence, being born with an abnormal open spine and/or a tethered spinal cord Review of Past Neurological Studies: No results found for this or any previous visit. Psychological-Psychiatric History: Anxiousness and Depressed Gastrointestinal History: Vomiting blood (Ulcers) and Heartburn due to stomach pushing into lungs (Hiatal hernia) Genitourinary History: No reported renal or genitourinary signs or symptoms such as difficulty voiding or producing urine, peeing blood, non-functioning kidney, kidney stones, difficulty emptying the bladder, difficulty controlling the flow of urine, or chronic kidney disease Hematological History: No reported hematological signs or symptoms such as prolonged bleeding, low or poor functioning platelets, bruising or bleeding easily, hereditary bleeding problems, low energy levels due to low hemoglobin or being anemic Endocrine History: No reported endocrine signs or symptoms such as high or low blood sugar, rapid heart rate due to high thyroid levels, obesity or weight gain due to slow thyroid or thyroid disease Rheumatologic History: No reported rheumatological signs and symptoms such as fatigue, joint pain, tenderness, swelling, redness, heat, stiffness, decreased range of motion, with or without associated rash Musculoskeletal History: Negative for myasthenia gravis, muscular dystrophy, multiple sclerosis or malignant hyperthermia Work History: Disabled  Allergies  Grace [name] is allergic to iodine.  Laboratory Chemistry  Inflammation Markers No results found for: CRP, ESRSEDRATE (CRP: Acute  Phase) (ESR: Chronic Phase) Renal Function Markers Lab Results  Component Value Date   BUN 10 10/11/2016   CREATININE 0.75 10/11/2016   GFRAA 97 10/11/2016   GFRNONAA 84 10/11/2016   Hepatic Function Markers Lab Results  Component Value Date  AST 22 08/23/2014   ALT 15 08/23/2014   ALBUMIN 3.7 08/23/2014   ALKPHOS 83 08/23/2014   Electrolytes Lab Results  Component Value Date   NA 140 10/11/2016   K 4.1 10/11/2016   CL 100 10/11/2016   CALCIUM 9.3 10/11/2016   Neuropathy Markers No results found for: [id number] Bone Pathology Markers Lab Results  Component Value Date   ALKPHOS 83 08/23/2014   CALCIUM 9.3 10/11/2016   Coagulation Parameters Lab Results  Component Value Date   PLT 237 07/25/2016   Cardiovascular Markers Lab Results  Component Value Date   BNP 38.0 08/22/2014   HGB 13.9 07/25/2016   HCT 42.7 07/25/2016   Note: Lab results reviewed.  [place]  Drug: Grace [name]  reports that she does not use drugs. Alcohol:  reports that she does not drink alcohol. Tobacco:  reports that she has quit smoking. Her smoking use included Cigarettes. She has a 25.00 pack-year smoking history. She has never used smokeless tobacco. Medical:  has a past medical history of Anxiety; Arthritis; Asthma; Cardiomyopathy ([place]); COPD (chronic obstructive pulmonary disease) ([place]); Depression; Dyspnea; GERD (gastroesophageal reflux disease); Hyperlipidemia; Hypertension; Pneumonia; PVC's (premature ventricular contractions); and Spinal cord injury at T7-T12 level ([place]). Family: family history includes Heart attack (age of onset: 20) in her father.  Past Surgical History:  Procedure Laterality Date  . APPENDECTOMY    . CARDIAC CATHETERIZATION    . CATARACT EXTRACTION     right eye   . CESAREAN SECTION    . COLONOSCOPY    . LAMINOTOMY    . LUMBAR LAMINECTOMY/DECOMPRESSION MICRODISCECTOMY Left 08/01/2016   Procedure: Left Lumbar Four-Five Laminectomy and Foraminotomy;  Surgeon:  Earnie Larsson, MD;  Location: [place];  Service: Neurosurgery;  Laterality: Left;   Active Ambulatory Problems    Diagnosis Date Noted  . PVC's (premature ventricular contractions) 05/08/2014  . SOB (shortness of breath) 05/08/2014  . COPD (chronic obstructive pulmonary disease) with chronic bronchitis ([place]) 05/08/2014  . Obesity 05/08/2014  . Tachycardia 05/08/2014  . Congestive dilated cardiomyopathy ([place]) 05/08/2014  . Sepsis ([place]) 06/30/2014  . CAP (community acquired pneumonia) 06/30/2014  . GERD (gastroesophageal reflux disease) 06/30/2014  . Depression 06/30/2014  . Thoracic compression fracture ([place]) 07/15/2014  . CHF (congestive heart failure), NYHA class I ([place]) 08/22/2014  . COPD exacerbation ([place])   . Systolic dysfunction with acute on chronic heart failure ( [place])   . Lumbar stenosis with neurogenic claudication 08/01/2016  . Breast cyst 11/21/2016  . Cardiomyopathy ([place]) 11/21/2016  . COPD (chronic obstructive pulmonary disease) ([place]) 11/21/2016  . Diabetes mellitus type 2, uncomplicated ([place]) 03/50/0938  . Fibroids 11/21/2016  . Hidradenitis 11/21/2016  . Nicotine abuse 11/21/2016  . Osteoarthritis cervical spine 11/21/2016  . Osteoarthritis of hands, bilateral 11/21/2016  . Osteoarthritis of knees, bilateral 11/21/2016  . PUD (peptic ulcer disease) 11/21/2016  . Pulmonary nodule 11/21/2016   Resolved Ambulatory Problems    Diagnosis Date Noted  . No Resolved Ambulatory Problems   Past Medical History:  Diagnosis Date  . Anxiety   . Arthritis   . Asthma   . Cardiomyopathy ([place])   . COPD (chronic obstructive pulmonary disease) ([place])   . Depression   . Dyspnea   . GERD (gastroesophageal reflux disease)   . Hyperlipidemia   . Hypertension   . Pneumonia   . PVC's (premature ventricular contractions)   . Spinal cord injury at T7-T12 level [facility])    Constitutional Exam  General appearance: Well nourished, well developed, and  well hydrated. In no apparent acute  distress Vitals:   11/21/16 0944 11/21/16 0946  BP: (!) 100/55   Pulse: 68   Resp: 16   Temp: 98.1 F (36.7 C)   SpO2: 92%   Weight: 215 lb (97.5 kg) 227 lb 4.8 oz (103.1 kg)  Height: '5\' 5"'$  (1.651 m)    BMI Assessment: Estimated body mass index is 37.82 kg/m as calculated from the following:   Height as of this encounter: '5\' 5"'$  (1.651 m).   Weight as of this encounter: 227 lb 4.8 oz (103.1 kg).  BMI interpretation table: BMI level Category Range association with higher incidence of chronic pain  <18 kg/m2 Underweight   18.5-24.9 kg/m2 Ideal body weight   25-29.9 kg/m2 Overweight Increased incidence by 20%  30-34.9 kg/m2 Obese (Class I) Increased incidence by 68%  35-39.9 kg/m2 Severe obesity (Class II) Increased incidence by 136%  >40 kg/m2 Extreme obesity (Class III) Increased incidence by 254%   BMI Readings from Last 4 Encounters:  11/21/16 37.82 kg/m  10/24/16 37.94 kg/m  08/01/16 38.94 kg/m  07/25/16 39.07 kg/m   Wt Readings from Last 4 Encounters:  11/21/16 227 lb 4.8 oz (103.1 kg)  10/24/16 228 lb (103.4 kg)  08/01/16 234 lb (106.1 kg)  07/25/16 234 lb 12.8 oz (106.5 kg)  Psych/Mental status: Alert, oriented x 3 (person, place, & time)       Eyes: PERLA Respiratory: No evidence of acute respiratory distress  Cervical Spine Exam  Inspection: No masses, redness, or swelling Alignment: Symmetrical Functional ROM: Unrestricted ROM      Stability: No instability detected Muscle strength & Tone: Functionally intact Sensory: Unimpaired Palpation: No palpable anomalies              Upper Extremity (UE) Exam    Side: Right upper extremity  Side: Left upper extremity  Inspection: No masses, redness, swelling, or asymmetry. No contractures  Inspection: No masses, redness, swelling, or asymmetry. No contractures  Functional ROM: Unrestricted ROM          Functional ROM: Unrestricted ROM          Muscle strength & Tone: Functionally intact  Muscle strength & Tone:  Functionally intact  Sensory: Unimpaired  Sensory: Unimpaired  Palpation: No palpable anomalies              Palpation: No palpable anomalies              Specialized Test(s): Deferred         Specialized Test(s): Deferred          Thoracic Spine Exam  Inspection: No masses, redness, or swelling Alignment: Symmetrical Functional ROM: Unrestricted ROM Stability: No instability detected Sensory: Unimpaired Muscle strength & Tone: No palpable anomalies  Lumbar Spine Exam  Inspection: No masses, redness, or swelling Alignment: Symmetrical Functional ROM: Unrestricted ROM      Stability: No instability detected Muscle strength & Tone: Functionally intact Sensory: Unimpaired Palpation: No palpable anomalies       Provocative Tests: Lumbar Hyperextension and rotation test: evaluation deferred today       Patrick's Maneuver: evaluation deferred today                    Gait & Posture Assessment  Ambulation: Patient came in today in a wheel chair Gait: Relatively normal for age and body habitus Posture: WNL   Lower Extremity Exam    Side: Right lower extremity  Side: Left lower extremity  Inspection: Edema  Inspection: Edema  Functional ROM: Decreased ROM          Functional ROM: Decreased ROM          Muscle strength & Tone: Functionally intact  Muscle strength & Tone: Functionally intact  Sensory: Unimpaired  Sensory: Unimpaired  Palpation: large amount of crepitus  Palpation: large amount of crepitus   Assessment  Primary Diagnosis & Pertinent Problem List: There were no encounter diagnoses.  Visit Diagnosis: No diagnosis found. Plan of Care  Initial treatment plan:  Please be advised that as per protocol, today's visit has been an evaluation only. We have not taken over the patient's controlled substance management.  Problem-specific plan: No problem-specific Assessment & Plan notes found for this encounter.  Ordered Lab-work, Procedure(s), Referral(s), & Consult(s): No  orders of the defined types were placed in this encounter.  Pharmacotherapy: Medications ordered:  No orders of the defined types were placed in this encounter.  Medications administered during this visit: Grace [name] had no medications administered during this visit.   Pharmacotherapy under consideration:  Opioid Analgesics: The patient was informed that there is no guarantee that she would be a candidate for opioid analgesics. The decision will be made following CDC guidelines. This decision will be based on the results of diagnostic studies, as well as Grace [name]'s risk profile.  Membrane stabilizer: To be determined at a later time Muscle relaxant: To be determined at a later time NSAID: To be determined at a later time Other analgesic(s): To be determined at a later time   Interventional therapies under consideration: Grace [name] was informed that there is no guarantee that she would be a candidate for interventional therapies. The decision will be based on the results of diagnostic studies, as well as Grace [name]'s risk profile.  Possible procedure(s): Bilateral intra-articular knee injection Bilateral genicular nerve block Possible bilateral knee RFA   Provider-requested follow-up: Return for Procedure(NS), w/ Dr. Laban Emperor, (ASAP).  Future Appointments Date Time Provider Department Center  11/22/2016 12:30 PM Delano Metz, MD [facility] None  11/28/2016 3:00 PM [facility] Barbara Cower [place] [id number]    Primary Care Physician: Jerl Mina, MD Location: [name] Outpatient Pain Management Facility Note by:  Date: 11/21/2016; Time: 11:11 AM  Pain Score Disclaimer: We use the NRS-11 scale. This is a self-reported, subjective measurement of pain severity with only modest accuracy. It is used primarily to identify changes within a particular patient. It must be understood that outpatient pain scales are significantly less accurate that those used for research, where they can be  applied under ideal controlled circumstances with minimal exposure to variables. In reality, the score is likely to be a combination of pain intensity and pain affect, where pain affect describes the degree of emotional arousal or changes in action readiness caused by the sensory experience of pain. Factors such as social and work situation, setting, emotional state, anxiety levels, expectation, and prior pain experience may influence pain perception and show large inter-individual differences that may also be affected by time variables.  Patient instructions provided during this appointment: Patient Instructions   Pain Management Discharge Instructions  General Discharge Instructions :  If you need to reach your doctor call: Monday-Friday 8:00 am - 4:00 pm at [phone] or toll free [phone].  After clinic hours [phone] to have operator reach doctor.  Bring all of your medication bottles to all your appointments in the pain clinic.  To cancel or reschedule your appointment with Pain Management please remember to call 24 hours  in advance to avoid a fee.  Refer to the educational materials which you have been given on: General Risks, I had my Procedure. Discharge Instructions, Post Sedation.  Post Procedure Instructions:  The drugs you were given will stay in your system until tomorrow, so for the next 24 hours you should not drive, make any legal decisions or drink any alcoholic beverages.  You may eat anything you prefer, but it is better to start with liquids then soups and crackers, and gradually work up to solid foods.  Please notify your doctor immediately if you have any unusual bleeding, trouble breathing or pain that is not related to your normal pain.  Depending on the type of procedure that was done, some parts of your body may feel week and/or numb.  This usually clears up by tonight or the next day.  Walk with the use of an assistive device or accompanied by an  adult for the 24 hours.  You may use ice on the affected area for the first 24 hours.  Put ice in a Ziploc bag and cover with a towel and place against area 15 minutes on 15 minutes off.  You may switch to heat after 24 hours. Knee Injection A knee injection is a procedure to get medicine into your knee joint. Your health care provider puts a needle into the joint and injects medicine with an attached syringe. The injected medicine may relieve the pain, swelling, and stiffness of arthritis. The injected medicine may also help to lubricate and cushion your knee joint. You may need more than one injection. Tell a health care provider about:  Any allergies you have.  All medicines you are taking, including vitamins, herbs, eye drops, creams, and over-the-counter medicines.  Any problems you or family members have had with anesthetic medicines.  Any blood disorders you have.  Any surgeries you have had.  Any medical conditions you have. What are the risks? Generally, this is a safe procedure. However, problems may occur, including:  Infection.  Bleeding.  Worsening symptoms.  Damage to the area around your knee.  Allergic reaction to any of the medicines.  Skin reactions from repeated injections.  What happens before the procedure?  Ask your health care provider about changing or stopping your regular medicines. This is especially important if you are taking diabetes medicines or blood thinners.  Plan to have someone take you home after the procedure. What happens during the procedure?  You will sit or lie down in a position for your knee to be treated.  The skin over your kneecap will be cleaned with a germ-killing solution (antiseptic).  You will be given a medicine that numbs the area (local anesthetic). You may feel some stinging.  After your knee becomes numb, you will have a second injection. This is the medicine. This needle is carefully placed between your kneecap  and your knee. The medicine is injected into the joint space.  At the end of the procedure, the needle will be removed.  A bandage (dressing) may be placed over the injection site. The procedure may vary among health care providers and hospitals. What happens after the procedure?  You may have to move your knee through its full range of motion. This helps to get all of the medicine into your joint space.  Your blood pressure, heart rate, breathing rate, and blood oxygen level will be monitored often until the medicines you were given have worn off.  You will be watched to  make sure that you do not have a reaction to the injected medicine. This information is not intended to replace advice given to you by your health care provider. Make sure you discuss any questions you have with your health care provider. Document Released: 04/24/2006 Document Revised: 07/03/2015 Document Reviewed: 12/11/2013 Elsevier Interactive Patient Education  2018 [place]  What are the risk, side effects and possible complications? Generally speaking, most procedures are safe.  However, with any procedure there are risks, side effects, and the possibility of complications.  The risks and complications are dependent upon the sites that are lesioned, or the type of nerve block to be performed.  The closer the procedure is to the spine, the more serious the risks are.  Great care is taken when placing the radio frequency needles, block needles or lesioning probes, but sometimes complications can occur. 1. Infection: Any time there is an injection through the skin, there is a risk of infection.  This is why sterile conditions are used for these blocks.  There are four possible types of infection. 1. Localized skin infection. 2. Central Nervous System Infection-This can be in the form of Meningitis, which can be deadly. 3. Epidural Infections-This can be in the form of an epidural  abscess, which can cause pressure inside of the spine, causing compression of the spinal cord with subsequent paralysis. This would require an emergency surgery to decompress, and there are no guarantees that the patient would recover from the paralysis. 4. Discitis-This is an infection of the intervertebral discs.  It occurs in about 1% of discography procedures.  It is difficult to treat and it may lead to surgery.        2. Pain: the needles have to go through skin and soft tissues, will cause soreness.       3. Damage to internal structures:  The nerves to be lesioned may be near blood vessels or    other nerves which can be potentially damaged.       4. Bleeding: Bleeding is more common if the patient is taking blood thinners such as  aspirin, Coumadin, Ticiid, Plavix, etc., or if he/she have some genetic predisposition  such as hemophilia. Bleeding into the spinal canal can cause compression of the spinal  cord with subsequent paralysis.  This would require an emergency surgery to  decompress and there are no guarantees that the patient would recover from the  paralysis.       5. Pneumothorax:  Puncturing of a lung is a possibility, every time a needle is introduced in  the area of the chest or upper back.  Pneumothorax refers to free air around the  collapsed lung(s), inside of the thoracic cavity (chest cavity).  Another two possible  complications related to a similar event would include: Hemothorax and Chylothorax.   These are variations of the Pneumothorax, where instead of air around the collapsed  lung(s), you may have blood or chyle, respectively.       6. Spinal headaches: They may occur with any procedures in the area of the spine.       7. Persistent CSF (Cerebro-Spinal Fluid) leakage: This is a rare problem, but may occur  with prolonged intrathecal or epidural catheters either due to the formation of a fistulous  track or a dural tear.       8. Nerve damage: By working so close to the  spinal cord, there is always a possibility of  nerve  damage, which could be as serious as a permanent spinal cord injury with  paralysis.       9. Death:  Although rare, severe deadly allergic reactions known as "Anaphylactic  reaction" can occur to any of the medications used.      10. Worsening of the symptoms:  We can always make thing worse.  What are the chances of something like this happening? Chances of any of this occuring are extremely low.  By statistics, you have more of a chance of getting killed in a motor vehicle accident: while driving to the hospital than any of the above occurring .  Nevertheless, you should be aware that they are possibilities.  In general, it is similar to taking a shower.  Everybody knows that you can slip, hit your head and get killed.  Does that mean that you should not shower again?  Nevertheless always keep in mind that statistics do not mean anything if you happen to be on the wrong side of them.  Even if a procedure has a 1 (one) in a 1,000,000 (million) chance of going wrong, it you happen to be that one..Also, keep in mind that by statistics, you have more of a chance of having something go wrong when taking medications.  Who should not have this procedure? If you are on a blood thinning medication (e.g. Coumadin, Plavix, see list of "Blood Thinners"), or if you have an active infection going on, you should not have the procedure.  If you are taking any blood thinners, please inform your physician.  How should I prepare for this procedure?  Do not eat or drink anything at least six hours prior to the procedure.  Bring a driver with you .  It cannot be a taxi.  Come accompanied by an adult that can drive you back, and that is strong enough to help you if your legs get weak or numb from the local anesthetic.  Take all of your medicines the morning of the procedure with just enough water to swallow them.  If you have diabetes, make sure that you are  scheduled to have your procedure done first thing in the morning, whenever possible.  If you have diabetes, take only half of your insulin dose and notify our nurse that you have done so as soon as you arrive at the clinic.  If you are diabetic, but only take blood sugar pills (oral hypoglycemic), then do not take them on the morning of your procedure.  You may take them after you have had the procedure.  Do not take aspirin or any aspirin-containing medications, at least eleven (11) days prior to the procedure.  They may prolong bleeding.  Wear loose fitting clothing that may be easy to take off and that you would not mind if it got stained with Betadine or blood.  Do not wear any jewelry or perfume  Remove any nail coloring.  It will interfere with some of our monitoring equipment.  NOTE: Remember that this is not meant to be interpreted as a complete list of all possible complications.  Unforeseen problems may occur.  BLOOD THINNERS The following drugs contain aspirin or other products, which can cause increased bleeding during surgery and should not be taken for 2 weeks prior to and 1 week after surgery.  If you should need take something for relief of minor pain, you may take acetaminophen which is found in Tylenol,m Datril, Anacin-3 and Panadol. It is not blood thinner. The  products listed below are.  Do not take any of the products listed below in addition to any listed on your instruction sheet.  A.P.C or A.P.C with Codeine Codeine Phosphate Capsules #3 Ibuprofen Ridaura  ABC compound Congesprin Imuran rimadil  Advil Cope Indocin Robaxisal  Alka-Seltzer Effervescent Pain Reliever and Antacid Coricidin or Coricidin-D  Indomethacin Rufen  Alka-Seltzer plus Cold Medicine Cosprin Ketoprofen S-A-C Tablets  Anacin Analgesic Tablets or Capsules Coumadin Korlgesic Salflex  Anacin Extra Strength Analgesic tablets or capsules CP-2 Tablets Lanoril Salicylate  Anaprox Cuprimine Capsules  Levenox Salocol  Anexsia-D Dalteparin Magan Salsalate  Anodynos Darvon compound Magnesium Salicylate Sine-off  Ansaid Dasin Capsules Magsal Sodium Salicylate  Anturane Depen Capsules Marnal Soma  APF Arthritis pain formula Dewitt's Pills Measurin Stanback  Argesic Dia-Gesic Meclofenamic Sulfinpyrazone  Arthritis Bayer Timed Release Aspirin Diclofenac Meclomen Sulindac  Arthritis pain formula Anacin Dicumarol Medipren Supac  Analgesic (Safety coated) Arthralgen Diffunasal Mefanamic Suprofen  Arthritis Strength Bufferin Dihydrocodeine Mepro Compound Suprol  Arthropan liquid Dopirydamole Methcarbomol with Aspirin Synalgos  ASA tablets/Enseals Disalcid Micrainin Tagament  Ascriptin Doan's Midol Talwin  Ascriptin A/D Dolene Mobidin Tanderil  Ascriptin Extra Strength Dolobid Moblgesic Ticlid  Ascriptin with Codeine Doloprin or Doloprin with Codeine Momentum Tolectin  Asperbuf Duoprin Mono-gesic Trendar  Aspergum Duradyne Motrin or Motrin IB Triminicin  Aspirin plain, buffered or enteric coated Durasal Myochrisine Trigesic  Aspirin Suppositories Easprin Nalfon Trillsate  Aspirin with Codeine Ecotrin Regular or Extra Strength Naprosyn Uracel  Atromid-S Efficin Naproxen Ursinus  Auranofin Capsules Elmiron Neocylate Vanquish  Axotal Emagrin Norgesic Verin  Azathioprine Empirin or Empirin with Codeine Normiflo Vitamin E  Azolid Emprazil Nuprin Voltaren  Bayer Aspirin plain, buffered or children's or timed BC Tablets or powders Encaprin Orgaran Warfarin Sodium  Buff-a-Comp Enoxaparin Orudis Zorpin  Buff-a-Comp with Codeine Equegesic Os-Cal-Gesic   Buffaprin Excedrin plain, buffered or Extra Strength Oxalid   Bufferin Arthritis Strength Feldene Oxphenbutazone   Bufferin plain or Extra Strength Feldene Capsules Oxycodone with Aspirin   Bufferin with Codeine Fenoprofen Fenoprofen Pabalate or Pabalate-SF   Buffets II Flogesic Panagesic   Buffinol plain or Extra Strength Florinal or Florinal with  Codeine Panwarfarin   Buf-Tabs Flurbiprofen Penicillamine   Butalbital Compound Four-way cold tablets Penicillin   Butazolidin Fragmin Pepto-Bismol   Carbenicillin Geminisyn Percodan   Carna Arthritis Reliever Geopen Persantine   Carprofen Gold's salt Persistin   Chloramphenicol Goody's Phenylbutazone   Chloromycetin Haltrain Piroxlcam   Clmetidine heparin Plaquenil   Cllnoril Hyco-pap Ponstel   Clofibrate Hydroxy chloroquine Propoxyphen         Before stopping any of these medications, be sure to consult the physician who ordered them.  Some, such as Coumadin (Warfarin) are ordered to prevent or treat serious conditions such as "deep thrombosis", "pumonary embolisms", and other heart problems.  The amount of time that you may need off of the medication may also vary with the medication and the reason for which you were taking it.  If you are taking any of these medications, please make sure you notify your pain physician before you undergo any procedures.

## 2016-11-22 ENCOUNTER — Encounter: Payer: Self-pay | Admitting: Pain Medicine

## 2016-11-22 ENCOUNTER — Ambulatory Visit: Payer: Medicare Other | Attending: Pain Medicine | Admitting: Pain Medicine

## 2016-11-22 VITALS — BP 121/28 | HR 72 | Temp 98.0°F | Resp 16 | Ht 65.0 in | Wt 227.0 lb

## 2016-11-22 DIAGNOSIS — M50122 Cervical disc disorder at C5-C6 level with radiculopathy: Secondary | ICD-10-CM | POA: Diagnosis not present

## 2016-11-22 DIAGNOSIS — M25562 Pain in left knee: Secondary | ICD-10-CM | POA: Insufficient documentation

## 2016-11-22 DIAGNOSIS — G8929 Other chronic pain: Secondary | ICD-10-CM | POA: Diagnosis not present

## 2016-11-22 DIAGNOSIS — M25561 Pain in right knee: Secondary | ICD-10-CM | POA: Diagnosis not present

## 2016-11-22 DIAGNOSIS — M5417 Radiculopathy, lumbosacral region: Secondary | ICD-10-CM | POA: Diagnosis not present

## 2016-11-22 DIAGNOSIS — M545 Low back pain: Secondary | ICD-10-CM | POA: Diagnosis not present

## 2016-11-22 DIAGNOSIS — M9903 Segmental and somatic dysfunction of lumbar region: Secondary | ICD-10-CM | POA: Diagnosis not present

## 2016-11-22 DIAGNOSIS — M9901 Segmental and somatic dysfunction of cervical region: Secondary | ICD-10-CM | POA: Diagnosis not present

## 2016-11-22 DIAGNOSIS — M170 Bilateral primary osteoarthritis of knee: Secondary | ICD-10-CM | POA: Insufficient documentation

## 2016-11-22 DIAGNOSIS — M4854XS Collapsed vertebra, not elsewhere classified, thoracic region, sequela of fracture: Secondary | ICD-10-CM | POA: Insufficient documentation

## 2016-11-22 MED ORDER — METHYLPREDNISOLONE ACETATE 80 MG/ML IJ SUSP
80.0000 mg | Freq: Once | INTRAMUSCULAR | Status: AC
  Administered 2016-11-22: 80 mg via INTRA_ARTICULAR
  Filled 2016-11-22: qty 1

## 2016-11-22 MED ORDER — LIDOCAINE HCL (PF) 1 % IJ SOLN
5.0000 mL | Freq: Once | INTRAMUSCULAR | Status: AC
  Administered 2016-11-22: 5 mL
  Filled 2016-11-22: qty 5

## 2016-11-22 MED ORDER — ROPIVACAINE HCL 2 MG/ML IJ SOLN
2.0000 mL | Freq: Once | INTRAMUSCULAR | Status: AC
  Administered 2016-11-22: 4 mL via INTRA_ARTICULAR

## 2016-11-22 MED ORDER — ROPIVACAINE HCL 2 MG/ML IJ SOLN
2.0000 mL | Freq: Once | INTRAMUSCULAR | Status: DC
  Filled 2016-11-22: qty 10

## 2016-11-22 NOTE — Progress Notes (Signed)
Safety precautions to be maintained throughout the outpatient stay will include: orient to surroundings, keep bed in low position, maintain call bell within reach at all times, provide assistance with transfer out of bed and ambulation.  

## 2016-11-22 NOTE — Patient Instructions (Addendum)
____________________________________________________________________________________________  Post-Procedure instructions Instructions:  Apply ice: Fill a plastic sandwich bag with crushed ice. Cover it with a small towel and apply to injection site. Apply for 15 minutes then remove x 15 minutes. Repeat sequence on day of procedure, until you go to bed. The purpose is to minimize swelling and discomfort after procedure.  Apply heat: Apply heat to procedure site starting the day following the procedure. The purpose is to treat any soreness and discomfort from the procedure.  Food intake: Start with clear liquids (like water) and advance to regular food, as tolerated.   Physical activities: Keep activities to a minimum for the first 8 hours after the procedure.   Driving: If you have received any sedation, you are not allowed to drive for 24 hours after your procedure.  Blood thinner: Restart your blood thinner 6 hours after your procedure. (Only for those taking blood thinners)  Insulin: As soon as you can eat, you may resume your normal dosing schedule. (Only for those taking insulin)  Infection prevention: Keep procedure site clean and dry.  Post-procedure Pain Diary: Extremely important that this be done correctly and accurately. Recorded information will be used to determine the next step in treatment.  Pain evaluated is that of treated area only. Do not include pain from an untreated area.  Complete every hour, on the hour, for the initial 8 hours. Set an alarm to help you do this part accurately.  Do not go to sleep and have it completed later. It will not be accurate.  Follow-up appointment: Keep your follow-up appointment after the procedure. Usually 2 weeks for most procedures. (6 weeks in the case of radiofrequency.) Bring you pain diary.  Expect:  From numbing medicine (AKA: Local Anesthetics): Numbness or decrease in pain.  Onset: Full effect within 15 minutes of  injected.  Duration: It will depend on the type of local anesthetic used. On the average, 1 to 8 hours.   From steroids: Decrease in swelling or inflammation. Once inflammation is improved, relief of the pain will follow.  Onset of benefits: Depends on the amount of swelling present. The more swelling, the longer it will take for the benefits to be seen. In some cases, up to 10 days.  Duration: Steroids will stay in the system x 2 weeks. Duration of benefits will depend on multiple posibilities including persistent irritating factors.  From procedure: Some discomfort is to be expected once the numbing medicine wears off. This should be minimal if ice and heat are applied as instructed. Call if:  You experience numbness and weakness that gets worse with time, as opposed to wearing off.  New onset bowel or bladder incontinence. (Spinal procedures only)  Emergency Numbers:  [place] hours (Monday - Thursday, 8:00 AM - 4:00 PM) (Friday, 9:00 AM - 12:00 Noon): [phone]  After hours: [phone] ____________________________________________________________________________________________  Post-procedure Information What to expect: Most procedures involve the use of a local anesthetic (numbing medicine), and a steroid (anti-inflammatory medicine).  The local anesthetics may cause temporary numbness and weakness of the legs or arms, depending on the location of the block. This numbness/weakness may last 4-6 hours, depending on the local anesthetic used. In rare instances, it can last up to 24 hours. While numb, you must be very careful not to injure the extremity.  After any procedure, you could expect the pain to get better within 15-20 minutes. This relief is temporary and may last 4-6 hours. Once the local anesthetics wears off,  you could experience discomfort, possibly more than usual, for up to 10 (ten) days. In the case of radiofrequencies, it may last up to 6 weeks.  Surgeries may take up to 8 weeks for the healing process. The discomfort is due to the irritation caused by needles going through skin and muscle. To minimize the discomfort, we recommend using ice the first day, and heat from then on. The ice should be applied for 15 minutes on, and 15 minutes off. Keep repeating this cycle until bedtime. Avoid applying the ice directly to the skin, to prevent frostbite. Heat should be used daily, until the pain improves (4-10 days). Be careful not to burn yourself.  Occasionally you may experience muscle spasms or cramps. These occur as a consequence of the irritation caused by the needle sticks to the muscle and the blood that will inevitably be lost into the surrounding muscle tissue. Blood tends to be very irritating to tissues, which tend to react by going into spasm. These spasms may start the same day of your procedure, but they may also take days to develop. This late onset type of spasm or cramp is usually caused by electrolyte imbalances triggered by the steroids, at the level of the kidney. Cramps and spasms tend to respond well to muscle relaxants, multivitamins (some are triggered by the procedure, but may have their origins in vitamin deficiencies), and "Gatorade", or any sports drinks that can replenish any electrolyte imbalances. (If you are a diabetic, ask your pharmacist to get you a sugar-free brand.) Warm showers or baths may also be helpful. Stretching exercises are highly recommended. General Instructions:  Be alert for signs of possible infection: redness, swelling, heat, red streaks, elevated temperature, and/or fever. These typically appear 4 to 6 days after the procedure. Immediately notify your doctor if you experience unusual bleeding, difficulty breathing, or loss of bowel or bladder control. If you experience increased pain, do not increase your pain medicine intake, unless instructed by your pain physician. Post-Procedure Care:  Be careful in  moving about. Muscle spasms in the area of the injection may occur. Applying ice or heat to the area is often helpful. The incidence of spinal headaches after epidural injections ranges between 1.4% and 6%. If you develop a headache that does not seem to respond to conservative therapy, please let your physician know. This can be treated with an epidural blood patch.   Post-procedure numbness or redness is to be expected, however it should average 4 to 6 hours. If numbness and weakness of your extremities begins to develop 4 to 6 hours after your procedure, and is felt to be progressing and worsening, immediately contact your physician.   Diet:  If you experience nausea, do not eat until this sensation goes away. If you had a "Stellate Ganglion Block" for upper extremity "Reflex Sympathetic Dystrophy", do not eat or drink until your hoarseness goes away. In any case, always start with liquids first and if you tolerate them well, then slowly progress to more solid foods. Activity:  For the first 4 to 6 hours after the procedure, use caution in moving about as you may experience numbness and/or weakness. Use caution in cooking, using household electrical appliances, and climbing steps. If you need to reach your Doctor call our office: [phone]) [phone] Monday-Thursday 8:00 am - 4:00 PM    Fridays: Closed     In case of an emergency: In case of emergency, call 911 or go to the nearest emergency room and have  the physician there call us.  Interpretation of Procedure Every nerve block has two components: a diagnostic component, and a treatment component. Unrealistic expectations are the most common causes of "perceived failure".  In a perfect world, a single nerve block should be able to completely and permanently eliminate the pain. Sadly, the world is not perfect.  Most pain management nerve blocks are performed using local anesthetics and steroids. Steroids are responsible for any long-term benefit that  you may experience. Their purpose is to decrease any chronic swelling that may exist in the area. Steroids begin to work immediately after being injected. However, most patients will not experience any benefits until 5 to 10 days after the injection, when the swelling has come down to the point where they can tell a difference. Steroids will only help if there is swelling to be treated. As such, they can assist with the diagnosis. If effective, they suggest an inflammatory component to the pain, and if ineffective, they rule out inflammation as the main cause or component of the problem. If the problem is one of mechanical compression, you will get no benefit from those steroids.   In the case of local anesthetics, they have a crucial role in the diagnosis of your condition. Most will begin to work within15 to 20 minutes after injection. The duration will depend on the type used (short- vs. Long-acting). It is of outmost importance that patients keep tract of their pain, after the procedure. To assist with this matter, a "Post-procedure Pain Diary" is provided. Make sure to complete it and to bring it back to your follow-up appointment.  As long as the patient keeps accurate, detailed records of their symptoms after every procedure, and returns to have those interpreted, every procedure will provide [place] with invaluable information. Even a block that does not provide the patient with any relief, will always provide [place] with information about the mechanism and the origin of the pain. The only time a nerve block can be considered a waste of time is when patients do not keep track of the results, or do not keep their post-procedure appointment.  Reporting the results back to your physician The Pain Score  Pain is a subjective complaint. It cannot be seen, touched, or measured. We depend entirely on the patient's report of the pain in order to assess your condition and treatment. To evaluate the pain, we use a pain  scale, where "0" means "No Pain", and a "10" is "the worst possible pain that you can even imagine" (i.e. something like been eaten alive by a shark or being torn apart by a lion).   You will frequently be asked to rate your pain. Please be as accurate, remember that medical decisions will be based on your responses. Please do not rate your pain above a 10. Doing so is actually interpreted as "symptom magnification" (exaggeration), as well as lack of understanding with regards to the scale. To put this into perspective, when you tell us that your pain is at a 10 (ten), what you are saying is that there is nothing we can do to make this pain any worse. (Carefully think about that.)\[phone]\

## 2016-11-22 NOTE — Progress Notes (Signed)
Patient's Name: Grace [name]  MRN: [id number]  Referring Provider: [name], MD  DOB: 18-Jun-1950  PCP: [name], MD  DOS: 11/22/2016  Note by: Gaspar Cola, MD  Service setting: Ambulatory outpatient  Specialty: Interventional Pain Management  Patient type: Established  Location: [facility] (AMB) Pain Management Facility  Visit type: Interventional Procedure   Primary Reason for Visit: Interventional Pain Management Treatment. CC: Knee Pain (bilateral)  Procedure:  Anesthesia, Analgesia, Anxiolysis:  Type: Diagnostic Intra-Articular Local anesthetic and steroid Knee Injection Region: Lateral infrapatellar Knee Region Level: Knee Joint Laterality: Bilateral  Type: Local Anesthesia Local Anesthetic: Lidocaine 1% Route: Infiltration ([place]/IM) IV Access: Declined Sedation: Declined  Indication(s): Analgesia           Indications: 1. Chronic knee pain (Primary Area of Pain) (Bilateral)   2. Osteoarthritis of knees (Bilateral)    Pain Score: Pre-procedure: 3 /10 Post-procedure: 0-No pain/10  Pre-op Assessment:  Grace [name] is a 66 y.o. (year old), female patient, seen today for interventional treatment. She  has a past surgical history that includes Cardiac catheterization; Cesarean section; Appendectomy; Cataract extraction; Colonoscopy; Lumbar laminectomy/decompression microdiscectomy (Left, 08/01/2016); and Laminotomy. Grace [name] has a current medication list which includes the following prescription(s): albuterol, alendronate, aripiprazole, diltiazem, duloxetine, ibuprofen, lorazepam, metoprolol succinate, modafinil, oxycodone-acetaminophen, and sacubitril-valsartan, and the following Facility-Administered Medications: ropivacaine (pf) 2 mg/ml (0.2%). Her primarily concern today is the Knee Pain (bilateral)  Initial Vital Signs: There were no vitals taken for this visit. BMI: Estimated body mass index is 37.77 kg/m as calculated from the following:   Height as of this  encounter: 5\' 5"  (1.651 m).   Weight as of this encounter: 227 lb (103 kg).  Risk Assessment: Allergies: Reviewed. She is allergic to iodine.  Allergy Precautions: None required Coagulopathies: Reviewed. None identified.  Blood-thinner therapy: None at this time Active Infection(s): Reviewed. None identified. Grace [name] is afebrile  Site Confirmation: Grace [name] was asked to confirm the procedure and laterality before marking the site Procedure checklist: Completed Consent: Before the procedure and under the influence of no sedative(s), amnesic(s), or anxiolytics, the patient was informed of the treatment options, risks and possible complications. To fulfill our ethical and legal obligations, as recommended by the American Medical Association's Code of Ethics, I have informed the patient of my clinical impression; the nature and purpose of the treatment or procedure; the risks, benefits, and possible complications of the intervention; the alternatives, including doing nothing; the risk(s) and benefit(s) of the alternative treatment(s) or procedure(s); and the risk(s) and benefit(s) of doing nothing. The patient was provided information about the general risks and possible complications associated with the procedure. These may include, but are not limited to: failure to achieve desired goals, infection, bleeding, organ or nerve damage, allergic reactions, paralysis, and death. In addition, the patient was informed of those risks and complications associated to the procedure, such as failure to decrease pain; infection; bleeding; organ or nerve damage with subsequent damage to sensory, motor, and/or autonomic systems, resulting in permanent pain, numbness, and/or weakness of one or several areas of the body; allergic reactions; (i.e.: anaphylactic reaction); and/or death. Furthermore, the patient was informed of those risks and complications associated with the medications. These include, but are not  limited to: allergic reactions (i.e.: anaphylactic or anaphylactoid reaction(s)); adrenal axis suppression; blood sugar elevation that in diabetics may result in ketoacidosis or comma; water retention that in patients with history of congestive heart failure may result in shortness of breath, pulmonary edema, and  decompensation with resultant heart failure; weight gain; swelling or edema; medication-induced neural toxicity; particulate matter embolism and blood vessel occlusion with resultant organ, and/or nervous system infarction; and/or aseptic necrosis of one or more joints. Finally, the patient was informed that Medicine is not an exact science; therefore, there is also the possibility of unforeseen or unpredictable risks and/or possible complications that may result in a catastrophic outcome. The patient indicated having understood very clearly. We have given the patient no guarantees and we have made no promises. Enough time was given to the patient to ask questions, all of which were answered to the patient's satisfaction. Grace [name] has indicated that she wanted to continue with the procedure. Attestation: I, the ordering provider, attest that I have discussed with the patient the benefits, risks, side-effects, alternatives, likelihood of achieving goals, and potential problems during recovery for the procedure that I have provided informed consent. Date: 11/22/2016; Time: 8:14 AM  Pre-Procedure Preparation:  Monitoring: As per clinic protocol. Respiration, ETCO2, SpO2, BP, heart rate and rhythm monitor placed and checked for adequate function Safety Precautions: Patient was assessed for positional comfort and pressure points before starting the procedure. Time-out: I initiated and conducted the "Time-out" before starting the procedure, as per protocol. The patient was asked to participate by confirming the accuracy of the "Time Out" information. Verification of the correct person, site, and  procedure were performed and confirmed by me, the nursing staff, and the patient. "Time-out" conducted as per Joint Commission's Universal Protocol (UP.01.01.01). "Time-out" Date & Time: 11/22/2016; 1330 hrs.  Description of Procedure Process:   Position: Sitting Target Area: Knee Joint Approach: Just above the Lateral tibial plateau, lateral to the infrapatellar tendon. Area Prepped: Entire knee area, from the mid-thigh to the mid-shin. Prepping solution: ChloraPrep (2% chlorhexidine gluconate and 70% isopropyl alcohol) Safety Precautions: Aspiration looking for blood return was conducted prior to all injections. At no point did we inject any substances, as a needle was being advanced. No attempts were made at seeking any paresthesias. Safe injection practices and needle disposal techniques used. Medications properly checked for expiration dates. SDV (single dose vial) medications used. Description of the Procedure: Protocol guidelines were followed. The patient was placed in position over the fluoroscopy table. The target area was identified and the area prepped in the usual manner. Skin desensitized using vapocoolant spray. Skin & deeper tissues infiltrated with local anesthetic. Appropriate amount of time allowed to pass for local anesthetics to take effect. The procedure needles were then advanced to the target area. Proper needle placement secured. Negative aspiration confirmed. Solution injected in intermittent fashion, asking for systemic symptoms every 0.5cc of injectate. The needles were then removed and the area cleansed, making sure to leave some of the prepping solution back to take advantage of its long term bactericidal properties. Vitals:   11/22/16 1245 11/22/16 1329 11/22/16 1334  BP: (!) 98/59 109/70 (!) 121/28  Pulse: 72    Resp:  18 16  Temp: 98 F (36.7 C)    TempSrc: Oral    SpO2: 91% 93% 93%  Weight: 227 lb (103 kg)    Height: 5\' 5"  (1.651 m)      Start Time: 1330  hrs. End Time: 1333 hrs. Materials:  Needle(s) Type: Regular needle Gauge: 22G Length: 3.5-in Medication(s): We administered methylPREDNISolone acetate, lidocaine (PF), lidocaine (PF), methylPREDNISolone acetate, and ropivacaine (PF) 2 mg/mL (0.2%). Please see chart orders for dosing details.  Imaging Guidance:  Type of Imaging Technique: None used Indication(s): N/A Exposure  Time: No patient exposure Contrast: None used. Fluoroscopic Guidance: N/A Ultrasound Guidance: N/A Interpretation: N/A  Antibiotic Prophylaxis:  Indication(s): None identified Antibiotic given: None  Post-operative Assessment:  EBL: None Complications: No immediate post-treatment complications observed by team, or reported by patient. Note: The patient tolerated the entire procedure well. A repeat set of vitals were taken after the procedure and the patient was kept under observation following institutional policy, for this type of procedure. Post-procedural neurological assessment was performed, showing return to baseline, prior to discharge. The patient was provided with post-procedure discharge instructions, including a section on how to identify potential problems. Should any problems arise concerning this procedure, the patient was given instructions to immediately contact us, at any time, without hesitation. In any case, we plan to contact the patient by telephone for a follow-up status report regarding this interventional procedure. Comments:  No additional relevant information.  Plan of Care    Imaging Orders     DG Knee 1-2 Views Right     DG Knee 1-2 Views Left  Procedure Orders     KNEE INJECTION  Medications ordered for procedure: Meds ordered this encounter  Medications  . methylPREDNISolone acetate (DEPO-MEDROL) injection 80 mg  . ropivacaine (PF) 2 mg/mL (0.2%) (NAROPIN) injection 2 mL  . lidocaine (PF) (XYLOCAINE) 1 % injection 5 mL  . lidocaine (PF) (XYLOCAINE) 1 % injection 5 mL  .  methylPREDNISolone acetate (DEPO-MEDROL) injection 80 mg  . ropivacaine (PF) 2 mg/mL (0.2%) (NAROPIN) injection 2 mL   Medications administered: We administered methylPREDNISolone acetate, lidocaine (PF), lidocaine (PF), methylPREDNISolone acetate, and ropivacaine (PF) 2 mg/mL (0.2%).  See the medical record for exact dosing, route, and time of administration.  New Prescriptions   No medications on file   Disposition: Discharge home  Discharge Date & Time: 11/22/2016; 1335 hrs.   Physician-requested Follow-up: Return for post-procedure eval by Dr. Dossie Arbour in 2 wks. Future Appointments Date Time Provider [place]  11/28/2016 3:00 PM [facility] Marcello [facility] [place] [id number]  12/07/2016 10:15 AM Milinda Pointer, MD [facility] None   Primary Care Physician: [name], MD Location: [facility] Note by: Gaspar Cola, MD Date: 11/22/2016; Time: 1:59 PM  Disclaimer:  Medicine is not an Chief Strategy Officer. The only guarantee in medicine is that nothing is guaranteed. It is important to note that the decision to proceed with this intervention was based on the information collected from the patient. The Data and conclusions were drawn from the patient's questionnaire, the interview, and the physical examination. Because the information was provided in large part by the patient, it cannot be guaranteed that it has not been purposely or unconsciously manipulated. Every effort has been made to obtain as much relevant data as possible for this evaluation. It is important to note that the conclusions that lead to this procedure are derived in large part from the available data. Always take into account that the treatment will also be dependent on availability of resources and existing treatment guidelines, considered by other Pain Management Practitioners as being common knowledge and practice, at the time of the intervention. For Medico-Legal purposes, it is also  important to point out that variation in procedural techniques and pharmacological choices are the acceptable norm. The indications, contraindications, technique, and results of the above procedure should only be interpreted and judged by a Board-Certified Interventional Pain Specialist with extensive familiarity and expertise in the same exact procedure and technique.

## 2016-11-23 ENCOUNTER — Telehealth: Payer: Self-pay | Admitting: *Deleted

## 2016-11-23 NOTE — Telephone Encounter (Signed)
No problems post procedure. 

## 2016-11-28 ENCOUNTER — Other Ambulatory Visit: Payer: Self-pay

## 2016-11-28 ENCOUNTER — Ambulatory Visit (INDEPENDENT_AMBULATORY_CARE_PROVIDER_SITE_OTHER): Payer: Medicare Other

## 2016-11-28 DIAGNOSIS — I5023 Acute on chronic systolic (congestive) heart failure: Secondary | ICD-10-CM

## 2016-11-28 DIAGNOSIS — I429 Cardiomyopathy, unspecified: Secondary | ICD-10-CM | POA: Diagnosis not present

## 2016-11-29 DIAGNOSIS — M545 Low back pain: Secondary | ICD-10-CM | POA: Diagnosis not present

## 2016-11-29 DIAGNOSIS — M9901 Segmental and somatic dysfunction of cervical region: Secondary | ICD-10-CM | POA: Diagnosis not present

## 2016-11-29 DIAGNOSIS — M9903 Segmental and somatic dysfunction of lumbar region: Secondary | ICD-10-CM | POA: Diagnosis not present

## 2016-11-29 DIAGNOSIS — M50122 Cervical disc disorder at C5-C6 level with radiculopathy: Secondary | ICD-10-CM | POA: Diagnosis not present

## 2016-11-29 DIAGNOSIS — M5417 Radiculopathy, lumbosacral region: Secondary | ICD-10-CM | POA: Diagnosis not present

## 2016-12-01 DIAGNOSIS — M9903 Segmental and somatic dysfunction of lumbar region: Secondary | ICD-10-CM | POA: Diagnosis not present

## 2016-12-01 DIAGNOSIS — M50122 Cervical disc disorder at C5-C6 level with radiculopathy: Secondary | ICD-10-CM | POA: Diagnosis not present

## 2016-12-01 DIAGNOSIS — M5417 Radiculopathy, lumbosacral region: Secondary | ICD-10-CM | POA: Diagnosis not present

## 2016-12-01 DIAGNOSIS — M9901 Segmental and somatic dysfunction of cervical region: Secondary | ICD-10-CM | POA: Diagnosis not present

## 2016-12-06 DIAGNOSIS — M5417 Radiculopathy, lumbosacral region: Secondary | ICD-10-CM | POA: Diagnosis not present

## 2016-12-06 DIAGNOSIS — M9901 Segmental and somatic dysfunction of cervical region: Secondary | ICD-10-CM | POA: Diagnosis not present

## 2016-12-06 DIAGNOSIS — R300 Dysuria: Secondary | ICD-10-CM | POA: Diagnosis not present

## 2016-12-06 DIAGNOSIS — M9903 Segmental and somatic dysfunction of lumbar region: Secondary | ICD-10-CM | POA: Diagnosis not present

## 2016-12-06 DIAGNOSIS — N390 Urinary tract infection, site not specified: Secondary | ICD-10-CM | POA: Diagnosis not present

## 2016-12-06 DIAGNOSIS — M50122 Cervical disc disorder at C5-C6 level with radiculopathy: Secondary | ICD-10-CM | POA: Diagnosis not present

## 2016-12-07 ENCOUNTER — Ambulatory Visit: Payer: Medicare Other | Attending: Pain Medicine | Admitting: Pain Medicine

## 2016-12-07 ENCOUNTER — Encounter: Payer: Self-pay | Admitting: Pain Medicine

## 2016-12-07 VITALS — BP 144/80 | HR 68 | Temp 97.7°F | Resp 16 | Ht 65.0 in | Wt 224.0 lb

## 2016-12-07 DIAGNOSIS — I110 Hypertensive heart disease with heart failure: Secondary | ICD-10-CM | POA: Insufficient documentation

## 2016-12-07 DIAGNOSIS — E669 Obesity, unspecified: Secondary | ICD-10-CM | POA: Insufficient documentation

## 2016-12-07 DIAGNOSIS — Z79891 Long term (current) use of opiate analgesic: Secondary | ICD-10-CM | POA: Insufficient documentation

## 2016-12-07 DIAGNOSIS — K279 Peptic ulcer, site unspecified, unspecified as acute or chronic, without hemorrhage or perforation: Secondary | ICD-10-CM | POA: Diagnosis not present

## 2016-12-07 DIAGNOSIS — R000 Tachycardia, unspecified: Secondary | ICD-10-CM | POA: Insufficient documentation

## 2016-12-07 DIAGNOSIS — I420 Dilated cardiomyopathy: Secondary | ICD-10-CM | POA: Diagnosis not present

## 2016-12-07 DIAGNOSIS — G894 Chronic pain syndrome: Secondary | ICD-10-CM | POA: Diagnosis not present

## 2016-12-07 DIAGNOSIS — M4854XA Collapsed vertebra, not elsewhere classified, thoracic region, initial encounter for fracture: Secondary | ICD-10-CM | POA: Diagnosis not present

## 2016-12-07 DIAGNOSIS — M47892 Other spondylosis, cervical region: Secondary | ICD-10-CM | POA: Diagnosis not present

## 2016-12-07 DIAGNOSIS — K219 Gastro-esophageal reflux disease without esophagitis: Secondary | ICD-10-CM | POA: Insufficient documentation

## 2016-12-07 DIAGNOSIS — G8929 Other chronic pain: Secondary | ICD-10-CM | POA: Diagnosis not present

## 2016-12-07 DIAGNOSIS — I493 Ventricular premature depolarization: Secondary | ICD-10-CM | POA: Diagnosis not present

## 2016-12-07 DIAGNOSIS — M25561 Pain in right knee: Secondary | ICD-10-CM

## 2016-12-07 DIAGNOSIS — M25562 Pain in left knee: Secondary | ICD-10-CM | POA: Diagnosis not present

## 2016-12-07 DIAGNOSIS — J449 Chronic obstructive pulmonary disease, unspecified: Secondary | ICD-10-CM | POA: Diagnosis not present

## 2016-12-07 DIAGNOSIS — M170 Bilateral primary osteoarthritis of knee: Secondary | ICD-10-CM | POA: Insufficient documentation

## 2016-12-07 DIAGNOSIS — R911 Solitary pulmonary nodule: Secondary | ICD-10-CM | POA: Insufficient documentation

## 2016-12-07 DIAGNOSIS — E119 Type 2 diabetes mellitus without complications: Secondary | ICD-10-CM | POA: Insufficient documentation

## 2016-12-07 DIAGNOSIS — F1390 Sedative, hypnotic, or anxiolytic use, unspecified, uncomplicated: Secondary | ICD-10-CM | POA: Insufficient documentation

## 2016-12-07 DIAGNOSIS — M48062 Spinal stenosis, lumbar region with neurogenic claudication: Secondary | ICD-10-CM | POA: Insufficient documentation

## 2016-12-07 DIAGNOSIS — D259 Leiomyoma of uterus, unspecified: Secondary | ICD-10-CM | POA: Diagnosis not present

## 2016-12-07 DIAGNOSIS — E785 Hyperlipidemia, unspecified: Secondary | ICD-10-CM | POA: Insufficient documentation

## 2016-12-07 DIAGNOSIS — Z791 Long term (current) use of non-steroidal anti-inflammatories (NSAID): Secondary | ICD-10-CM | POA: Diagnosis not present

## 2016-12-07 DIAGNOSIS — Z87891 Personal history of nicotine dependence: Secondary | ICD-10-CM | POA: Insufficient documentation

## 2016-12-07 DIAGNOSIS — F1190 Opioid use, unspecified, uncomplicated: Secondary | ICD-10-CM | POA: Insufficient documentation

## 2016-12-07 DIAGNOSIS — F329 Major depressive disorder, single episode, unspecified: Secondary | ICD-10-CM | POA: Diagnosis not present

## 2016-12-07 DIAGNOSIS — Z79899 Other long term (current) drug therapy: Secondary | ICD-10-CM | POA: Insufficient documentation

## 2016-12-07 DIAGNOSIS — I509 Heart failure, unspecified: Secondary | ICD-10-CM | POA: Insufficient documentation

## 2016-12-07 NOTE — Progress Notes (Signed)
Patient's Name: Grace [name]  MRN: [id number]  Referring Provider: Jerl Mina, MD  DOB: [birth year]  PCP: Jerl Mina, MD  DOS: 12/07/2016  Note by: Oswaldo Done, MD  Service setting: Ambulatory outpatient  Specialty: Interventional Pain Management  Location: [facility] (AMB) Pain Management Facility    Patient type: Established   Primary Reason(s) for Visit: Encounter for post-procedure evaluation of chronic illness with mild to moderate exacerbation CC: Knee Pain (bilateral)  HPI  [name] is a 66 y.o. year old, female patient, who comes today for a post-procedure evaluation. 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); Obesity; Tachycardia; Congestive dilated cardiomyopathy (HCC); GERD (gastroesophageal reflux disease); Depression; CHF (congestive heart failure), NYHA class I (HCC); Systolic dysfunction with acute on chronic heart failure (HCC); Lumbar stenosis with neurogenic claudication; Breast cyst; Cardiomyopathy (HCC); Diabetes mellitus type 2, uncomplicated (HCC); Fibroids; Hidradenitis; Nicotine abuse; Osteoarthritis cervical spine; Osteoarthritis of hands (Bilateral); PUD (peptic ulcer disease); Pulmonary nodule; Chronic knee pain (Primary Area of Pain) (Bilateral); Chronic pain syndrome; Osteoarthritis of knees (Bilateral); Thoracic compression fracture (T4, T5, T6, and T7); Long term prescription benzodiazepine use; Long term prescription opiate use; Opiate use; and NSAID long-term use on her problem list. Her primarily concern today is the Knee Pain (bilateral)  Pain Assessment: Location: Left, Right Knee Onset: More than a month ago Duration: Chronic pain Quality: Aching Severity: 0-No pain/10 (self-reported pain score)  Note: Reported level is compatible with observation.                         Timing: Intermittent  Ms. Sieh comes in today for post-procedure evaluation  after the treatment done on 11/22/2016.  Further details on both, my assessment(s), as well as the proposed treatment plan, please see below.  Post-Procedure Assessment  11/22/2016 Procedure: Diagnostic bilateral intra-articular knee injection with local anesthetic and steroid Pre-procedure pain score:  3/10 Post-procedure pain score: 0/10 (100% relief) Influential Factors: BMI: 37.28 kg/m Intra-procedural challenges: None observed.         Assessment challenges: None detected.              Reported side-effects: None.        Post-procedural adverse reactions or complications: None reported         Sedation: No sedation used. When no sedatives are used, the analgesic levels obtained are directly associated to the effectiveness of the local anesthetics. However, when sedation is provided, the level of analgesia obtained during the initial 1 hour following the intervention, is believed to be the result of a combination of factors. These factors may include, but are not limited to: 1. The effectiveness of the local anesthetics used. 2. The effects of the analgesic(s) and/or anxiolytic(s) used. 3. The degree of discomfort experienced by the patient at the time of the procedure. 4. The patients ability and reliability in recalling and recording the events. 5. The presence and influence of possible secondary gains and/or psychosocial factors. Reported result: Relief experienced during the 1st hour after the procedure: 100 % (Ultra-Short Term Relief)            Interpretative annotation: Clinically appropriate result. Analgesia during this period is likely to be Local Anesthetic and/or IV Sedative (Analgesic/Anxiolytic) related.          Effects of local anesthetic: The analgesic effects attained during this period are directly associated to the localized infiltration of local anesthetics and therefore cary significant  diagnostic value as to the etiological location, or anatomical origin, of the  pain. Expected duration of relief is directly dependent on the pharmacodynamics of the local anesthetic used. Long-acting (4-6 hours) anesthetics used.  Reported result: Relief during the next 4 to 6 hour after the procedure: 0 % (Short-Term Relief)            Interpretative annotation: Clinically appropriate result. Analgesia during this period is likely to be Local Anesthetic-related.          Long-term benefit: Defined as the period of time past the expected duration of local anesthetics (1 hour for short-acting and 4-6 hours for long-acting). With the possible exception of prolonged sympathetic blockade from the local anesthetics, benefits during this period are typically attributed to, or associated with, other factors such as analgesic sensory neuropraxia, antiinflammatory effects, or beneficial biochemical changes provided by agents other than the local anesthetics.  Reported result: Extended relief following procedure: 75 % (ongoing) (Long-Term Relief)            Interpretative annotation: Clinically appropriate result. Good relief. No permanent benefit expected. Inflammation plays a part in the etiology to the pain.          Current benefits: Defined as reported results that persistent at this point in time.   Analgesia: 100 %            Function: Somewhat improved ROM: Somewhat improved Interpretative annotation: Recurrence of symptoms. No permanent benefit expected. Effective diagnostic intervention.          Interpretation: Results would suggest a successful diagnostic intervention.                  Plan:  Set up procedure as a PRN palliative treatment option for this patient.        Laboratory Chemistry  Inflammation Markers (CRP: Acute Phase) (ESR: Chronic Phase) No results found for: CRP, ESRSEDRATE               Renal Function Markers Lab Results  Component Value Date   BUN 10 10/11/2016   CREATININE 0.75 10/11/2016   GFRAA 97 10/11/2016   GFRNONAA 84 10/11/2016                  Hepatic Function Markers Lab Results  Component Value Date   AST 22 08/23/2014   ALT 15 08/23/2014   ALBUMIN 3.7 08/23/2014   ALKPHOS 83 08/23/2014                 Electrolytes Lab Results  Component Value Date   NA 140 10/11/2016   K 4.1 10/11/2016   CL 100 10/11/2016   CALCIUM 9.3 10/11/2016                 Neuropathy Markers No results found for: [id number]               Bone Pathology Markers Lab Results  Component Value Date   ALKPHOS 83 08/23/2014   CALCIUM 9.3 10/11/2016                 Coagulation Parameters Lab Results  Component Value Date   PLT 237 07/25/2016                 Cardiovascular Markers Lab Results  Component Value Date   BNP 38.0 08/22/2014   HGB 13.9 07/25/2016   HCT 42.7 07/25/2016                 Note:  Lab results reviewed.  Recent Diagnostic Imaging Results  ECHOCARDIOGRAM COMPLETE                         *[facility]                        [place], [place] [address]                            [phone]  ------------------------------------------------------------------- Transthoracic Echocardiography  Patient:    Hae, Ahlers MR #:       [id number] Study Date: 11/28/2016 Gender:     F Age:        66 Height:     165.1 cm Weight:     103 kg BSA:        2.22 m^2 Pt. Status: Room:   ATTENDING    Default, Provider [phone]  SONOGRAPHER  [address] RVT, RDCS  REFERRING    Esmond Plants, MD, [facility]  ORDERING     Gollan, Tim  REFERRING    Gollan, Tim  PERFORMING   [facility], Gould  cc:  ------------------------------------------------------------------- LV EF: 55% -   60%  ------------------------------------------------------------------- History:   PMH:   Dyspnea.  Congestive heart failure. Cardiomyopathy of unknown etiology.  Chronic obstructive pulmonary disease.  Risk factors:  Former tobacco use.  Hypertension. Dyslipidemia.  ------------------------------------------------------------------- Study Conclusions  - Left ventricle: The cavity size was normal. Systolic function was   normal. The estimated ejection fraction was in the range of 55%   to 60%. Wall motion was normal; there were no regional wall   motion abnormalities. Doppler parameters are consistent with   abnormal left ventricular relaxation (grade 1 diastolic   dysfunction). - Mitral valve: There was mild regurgitation. - Left atrium: The atrium was normal in size. - Right ventricle: Systolic function was normal. - Pulmonary arteries: Systolic pressure was within the normal   range.  ------------------------------------------------------------------- Study data:  Comparison was made to the study of 07/16/2014. Previous Echo showed LV EF 17-79%, grade I diastolic dysfunction. Study status:  Routine.  Procedure:  Transthoracic echocardiography. Image quality was adequate.  Study completion: There were no complications.          Transthoracic echocardiography.  M-mode, complete 2D, spectral Doppler, and color Doppler.  Birthdate:  Patient birthdate: 06/10/1950.  Age:  Patient is 66 yr old.  Sex:  Gender: female.    BMI: 37.8 kg/m^2.  Blood pressure:     110/66  Patient status:  Outpatient.  Study date: Study date: 11/28/2016. Study time: 03:14 PM.  -------------------------------------------------------------------  ------------------------------------------------------------------- Left ventricle:  The cavity size was normal. Systolic function was normal. The estimated ejection fraction was in the range of 55% to 60%. Wall motion was normal; there were no regional wall motion abnormalities. Doppler parameters are consistent with abnormal left ventricular relaxation (grade 1 diastolic dysfunction).  ------------------------------------------------------------------- Aortic valve:  mildly calcified leaflets.  Mobility was not restricted.  Doppler:  Transvalvular velocity was within the normal range. There was no stenosis. There was no regurgitation.  ------------------------------------------------------------------- Aorta:  Aortic root: The aortic root was normal in size.  ------------------------------------------------------------------- Mitral valve:   Structurally normal valve.   Mobility was not restricted.  Doppler:  Transvalvular velocity was within the normal range.  There was no evidence for stenosis. There was mild regurgitation.  ------------------------------------------------------------------- Left atrium:  The atrium was normal in size.  ------------------------------------------------------------------- Right ventricle:  The cavity size was normal. Wall thickness was normal. Systolic function was normal.  ------------------------------------------------------------------- Pulmonic valve:    Structurally normal valve.   Cusp separation was normal.  Doppler:  Transvalvular velocity was within the normal range. There was no evidence for stenosis. There was no regurgitation.  ------------------------------------------------------------------- Tricuspid valve:   Structurally normal valve.    Doppler: Transvalvular velocity was within the normal range. There was no regurgitation.  ------------------------------------------------------------------- Pulmonary artery:   The main pulmonary artery was normal-sized. Systolic pressure was within the normal range.  ------------------------------------------------------------------- Right atrium:  The atrium was normal in size.  ------------------------------------------------------------------- Pericardium:  There was no pericardial effusion.  ------------------------------------------------------------------- Systemic veins: Inferior vena cava: The vessel was normal in size. The respirophasic diameter changes were in the  normal range (>= 50%), consistent with normal central venous pressure.  ------------------------------------------------------------------- Measurements   Left ventricle                           Value        Reference  LV ID, ED, PLAX chordal                  44    mm     43 - 52  LV ID, ES, PLAX chordal                  34    mm     23 - 38  LV fx shortening, PLAX chordal   (L)     23    %      >=29  LV PW thickness, ED                      11    mm     ----------  IVS/LV PW ratio, ED                      1.18         <=1.3  Stroke volume, 2D                        39    ml     ----------  Stroke volume/bsa, 2D                    18    ml/m^2 ----------  LV ejection fraction, 1-p A4C            61    %      ----------  LV end-diastolic volume, 2-p             60    ml     ----------  LV end-systolic volume, 2-p              24    ml     ----------  LV ejection fraction, 2-p                60    %      ----------  Stroke volume, 2-p                       36    ml     ----------  LV end-diastolic volume/bsa, 2-p  27    ml/m^2 ----------  LV end-systolic volume/bsa, 2-p          11    ml/m^2 ----------  Stroke volume/bsa, 2-p                   16.1  ml/m^2 ----------  LV e&', lateral                           6.31  cm/s   ----------  LV E/e&', lateral                         6.23         ----------  LV e&', medial                            5     cm/s   ----------  LV E/e&', medial                          7.86         ----------  LV e&', average                           5.66  cm/s   ----------  LV E/e&', average                         6.95         ----------    Ventricular septum                       Value        Reference  IVS thickness, ED                        13    mm     ----------    LVOT                                     Value        Reference  LVOT ID, S                               18    mm     ----------  LVOT area                                2.54  cm^2    ----------  LVOT mean velocity, S                    45.7  cm/s   ----------  LVOT VTI, S                              15.4  cm     ----------    Aorta                                    Value  Reference  Aortic root ID, ED                       25    mm     ----------    Left atrium                              Value        Reference  LA ID, A-P, ES                           31    mm     ----------  LA ID/bsa, A-P                           1.4   cm/m^2 <=2.2  LA volume, S                             32.9  ml     ----------  LA volume/bsa, S                         14.8  ml/m^2 ----------  LA volume, ES, 1-p A4C                   37.8  ml     ----------  LA volume/bsa, ES, 1-p A4C               17    ml/m^2 ----------  LA volume, ES, 1-p A2C                   27.3  ml     ----------  LA volume/bsa, ES, 1-p A2C               12.3  ml/m^2 ----------    Mitral valve                             Value        Reference  Mitral E-wave peak velocity              39.3  cm/s   ----------  Mitral A-wave peak velocity              64.5  cm/s   ----------  Mitral deceleration time         (H)     320   ms     150 - 230  Mitral E/A ratio, peak                   0.6          ----------    Right atrium                             Value        Reference  RA ID, S-I, ES, A4C                      38.9  mm     34 - 49  RA area, ES, A4C  13    cm^2   8.3 - 19.5  RA volume, ES, A/L                       31.1  ml     ----------  RA volume/bsa, ES, A/L                   14    ml/m^2 ----------    Right ventricle                          Value        Reference  TAPSE                                    21.1  mm     ----------  RV s&', lateral, S                        16.1  cm/s   ----------    Pulmonic valve                           Value        Reference  Pulmonic valve peak velocity, S          94.7  cm/s   ----------  Legend: (L)  and  (H)  mark values outside specified  reference range.  ------------------------------------------------------------------- Prepared and Electronically Authenticated by  Esmond Plants, MD, [facility] 2018-10-15T20:30:59  Complexity Note: Imaging results reviewed. Results shared with Ms. Kuipers, using Layman's terms.                         Meds   Current Outpatient Prescriptions:  .  albuterol (PROAIR HFA) 108 (90 Base) MCG/ACT inhaler, Inhale into the lungs., Disp: , Rfl:  .  alendronate (FOSAMAX) 70 MG tablet, Take 70 mg by mouth every Monday., Disp: , Rfl:  .  ARIPiprazole (ABILIFY) 5 MG tablet, Take 5 mg by mouth daily., Disp: , Rfl:  .  Armodafinil (NUVIGIL) 150 MG tablet, Take 150 mg by mouth as needed. , Disp: , Rfl:  .  diltiazem (CARDIZEM) 30 MG tablet, Take 1 tablet (30 mg total) by mouth 3 (three) times daily as needed., Disp: 90 tablet, Rfl: 3 .  DULoxetine (CYMBALTA) 60 MG capsule, Take 60 mg by mouth daily., Disp: , Rfl:  .  ibuprofen (ADVIL,MOTRIN) 200 MG tablet, Take 800 mg by mouth every 8 (eight) hours as needed (for pain.)., Disp: , Rfl:  .  LORazepam (ATIVAN) 0.5 MG tablet, Take 0.5 mg by mouth daily as needed for anxiety. , Disp: , Rfl:  .  metoprolol succinate (TOPROL-XL) 100 MG 24 hr tablet, TAKE ONE TABLET BY MOUTH ONCE DAILY, Disp: 30 tablet, Rfl: 0 .  modafinil (PROVIGIL) 200 MG tablet, Take 200 mg by mouth daily as needed (for alertness 3rd shift). , Disp: , Rfl:  .  nitrofurantoin, macrocrystal-monohydrate, (MACROBID) 100 MG capsule, Take by mouth., Disp: , Rfl:  .  oxyCODONE-acetaminophen (PERCOCET/ROXICET) 5-325 MG tablet, Take 1 tablet by mouth every 6 (six) hours as needed. For pain., Disp: , Rfl: 0 .  sacubitril-valsartan (ENTRESTO) 49-51 MG, Take 1 tablet by mouth 2 (two) times daily., Disp: 60 tablet, Rfl: 6  ROS  Constitutional: Denies any fever or  chills Gastrointestinal: No reported hemesis, hematochezia, vomiting, or acute GI distress Musculoskeletal: Denies any acute onset joint swelling,  redness, loss of ROM, or weakness Neurological: No reported episodes of acute onset apraxia, aphasia, dysarthria, agnosia, amnesia, paralysis, loss of coordination, or loss of consciousness  Allergies  Ms. Fulcher is allergic to iodine.  [place]  Drug: Ms. Danziger  reports that she does not use drugs. Alcohol:  reports that she does not drink alcohol. Tobacco:  reports that she has quit smoking. Her smoking use included Cigarettes. She has a 25.00 pack-year smoking history. She has never used smokeless tobacco. Medical:  has a past medical history of Anxiety; Arthritis; Asthma; CAP (community acquired pneumonia) (06/30/2014); Cardiomyopathy ([place]); COPD (chronic obstructive pulmonary disease) ([place]); Depression; Dyspnea; GERD (gastroesophageal reflux disease); Hyperlipidemia; Hypertension; Pneumonia; PVC's (premature ventricular contractions); Sepsis ([place]) (06/30/2014); SOB (shortness of breath) (05/08/2014); and Spinal cord injury at T7-T12 level [facility]). Surgical: Ms. Halle  has a past surgical history that includes Cardiac catheterization; Cesarean section; Appendectomy; Cataract extraction; Colonoscopy; Lumbar laminectomy/decompression microdiscectomy (Left, 08/01/2016); and Laminotomy. Family: family history includes Heart attack (age of onset: 74) in her father.  Constitutional Exam  General appearance: Well nourished, well developed, and well hydrated. In no apparent acute distress Vitals:   12/07/16 1020  BP: (!) 144/80  Pulse: 68  Resp: 16  Temp: 97.7 F (36.5 C)  SpO2: 94%  Weight: 224 lb (101.6 kg)  Height: '5\' 5"'$  (1.651 m)   BMI Assessment: Estimated body mass index is 37.28 kg/m as calculated from the following:   Height as of this encounter: '5\' 5"'$  (1.651 m).   Weight as of this encounter: 224 lb (101.6 kg).  BMI interpretation table: BMI level Category Range association with higher incidence of chronic pain  <18 kg/m2 Underweight   18.5-24.9 kg/m2 Ideal body weight   25-29.9  kg/m2 Overweight Increased incidence by 20%  30-34.9 kg/m2 Obese (Class I) Increased incidence by 68%  35-39.9 kg/m2 Severe obesity (Class II) Increased incidence by 136%  >40 kg/m2 Extreme obesity (Class III) Increased incidence by 254%   BMI Readings from Last 4 Encounters:  12/07/16 37.28 kg/m  11/22/16 37.77 kg/m  11/21/16 37.82 kg/m  10/24/16 37.94 kg/m   Wt Readings from Last 4 Encounters:  12/07/16 224 lb (101.6 kg)  11/22/16 227 lb (103 kg)  11/21/16 227 lb 4.8 oz (103.1 kg)  10/24/16 228 lb (103.4 kg)  Psych/Mental status: Alert, oriented x 3 (person, place, & time)       Eyes: PERLA Respiratory: No evidence of acute respiratory distress  Cervical Spine Area Exam  Skin & Axial Inspection: No masses, redness, edema, swelling, or associated skin lesions Alignment: Symmetrical Functional ROM: Unrestricted ROM      Stability: No instability detected Muscle Tone/Strength: Functionally intact. No obvious neuro-muscular anomalies detected. Sensory (Neurological): Unimpaired Palpation: No palpable anomalies              Upper Extremity (UE) Exam    Side: Right upper extremity  Side: Left upper extremity  Skin & Extremity Inspection: Skin color, temperature, and hair growth are WNL. No peripheral edema or cyanosis. No masses, redness, swelling, asymmetry, or associated skin lesions. No contractures.  Skin & Extremity Inspection: Skin color, temperature, and hair growth are WNL. No peripheral edema or cyanosis. No masses, redness, swelling, asymmetry, or associated skin lesions. No contractures.  Functional ROM: Unrestricted ROM          Functional ROM: Unrestricted ROM  Muscle Tone/Strength: Functionally intact. No obvious neuro-muscular anomalies detected.  Muscle Tone/Strength: Functionally intact. No obvious neuro-muscular anomalies detected.  Sensory (Neurological): Unimpaired          Sensory (Neurological): Unimpaired          Palpation: No palpable anomalies               Palpation: No palpable anomalies              Specialized Test(s): Deferred         Specialized Test(s): Deferred          Thoracic Spine Area Exam  Skin & Axial Inspection: No masses, redness, or swelling Alignment: Symmetrical Functional ROM: Unrestricted ROM Stability: No instability detected Muscle Tone/Strength: Functionally intact. No obvious neuro-muscular anomalies detected. Sensory (Neurological): Unimpaired Muscle strength & Tone: No palpable anomalies  Lumbar Spine Area Exam  Skin & Axial Inspection: No masses, redness, or swelling Alignment: Symmetrical Functional ROM: Unrestricted ROM      Stability: No instability detected Muscle Tone/Strength: Functionally intact. No obvious neuro-muscular anomalies detected. Sensory (Neurological): Unimpaired Palpation: No palpable anomalies       Provocative Tests: Lumbar Hyperextension and rotation test: evaluation deferred today       Lumbar Lateral bending test: evaluation deferred today       Patrick's Maneuver: evaluation deferred today                    Gait & Posture Assessment  Ambulation: Patient came in today in a wheel chair Gait: Very limited, using assistive device to ambulate Posture: WNL   Lower Extremity Exam    Side: Right lower extremity  Side: Left lower extremity  Skin & Extremity Inspection: Skin color, temperature, and hair growth are WNL. No peripheral edema or cyanosis. No masses, redness, swelling, asymmetry, or associated skin lesions. No contractures.  Skin & Extremity Inspection: Skin color, temperature, and hair growth are WNL. No peripheral edema or cyanosis. No masses, redness, swelling, asymmetry, or associated skin lesions. No contractures.  Functional ROM: Unrestricted ROM          Functional ROM: Unrestricted ROM          Muscle Tone/Strength: Functionally intact. No obvious neuro-muscular anomalies detected.  Muscle Tone/Strength: Functionally intact. No obvious neuro-muscular anomalies  detected.  Sensory (Neurological): Unimpaired  Sensory (Neurological): Unimpaired  Palpation: No palpable anomalies  Palpation: No palpable anomalies   Assessment  Primary Diagnosis & Pertinent Problem List: The primary encounter diagnosis was Chronic knee pain (Primary Area of Pain) (Bilateral). Diagnoses of Osteoarthritis of knees (Bilateral) and Chronic pain syndrome were also pertinent to this visit.  Status Diagnosis  Resolved Controlled Controlled 1. Chronic knee pain (Primary Area of Pain) (Bilateral)   2. Osteoarthritis of knees (Bilateral)   3. Chronic pain syndrome     Problems updated and reviewed during this visit: Problem  Long Term Prescription Benzodiazepine Use  Long Term Prescription Opiate Use  Opiate Use  Nsaid Long-Term Use   Plan of Care  Pharmacotherapy (Medications Ordered): No orders of the defined types were placed in this encounter.  New Prescriptions   No medications on file   Medications administered today: Ms. Shellenbarger had no medications administered during this visit.   Procedure Orders     KNEE INJECTION Lab Orders  No laboratory test(s) ordered today   Imaging Orders  No imaging studies ordered today   Referral Orders  No referral(s) requested today    Interventional management options: Planned,  scheduled, and/or pending:   Bilateral intra-articular Hyalgan knee injection (prn)    Considering:   Therapeutic bilateral intra-articular Hyalgan knee injection    Palliative PRN treatment(s):   Therapeutic bilateral intra-articular Hyalgan knee injection    Provider-requested follow-up: Return if symptoms worsen or fail to improve, for PRN Procedure: (B) IA Hyalgan.  No future appointments. Primary Care Physician: [name], MD Location: [facility] Note by: Gaspar Cola, MD Date: 12/07/2016; Time: 11:18 AM

## 2016-12-07 NOTE — Progress Notes (Signed)
Safety precautions to be maintained throughout the outpatient stay will include: orient to surroundings, keep bed in low position, maintain call bell within reach at all times, provide assistance with transfer out of bed and ambulation.  

## 2016-12-07 NOTE — Patient Instructions (Signed)
____________________________________________________________________________________________  Preparing for your procedure (without sedation) Instructions: . Oral Intake: Do not eat or drink anything for at least 3 hours prior to your procedure. . Transportation: Unless otherwise stated by your physician, you may drive yourself after the procedure. . Blood Pressure Medicine: Take your blood pressure medicine with a sip of water the morning of the procedure. . Blood thinners:  . Diabetics on insulin: Notify the staff so that you can be scheduled 1st case in the morning. If your diabetes requires high dose insulin, take only  of your normal insulin dose the morning of the procedure and notify the staff that you have done so. . Preventing infections: Shower with an antibacterial soap the morning of your procedure.  . Build-up your immune system: Take 1000 mg of Vitamin C with every meal (3 times a day) the day prior to your procedure. Marland Kitchen Antibiotics: Inform the staff if you have a condition or reason that requires you to take antibiotics before dental procedures. . Pregnancy: If you are pregnant, call and cancel the procedure. . Sickness: If you have a cold, fever, or any active infections, call and cancel the procedure. . Arrival: You must be in the facility at least 30 minutes prior to your scheduled procedure. . Children: Do not bring any children with you. . Dress appropriately: Bring dark clothing that you would not mind if they get stained. . Valuables: Do not bring any jewelry or valuables. Procedure appointments are reserved for interventional treatments only. Marland Kitchen No Prescription Refills. . No medication changes will be discussed during procedure appointments. . No disability issues will be discussed. ____________________________________________________________________________________________

## 2016-12-15 DIAGNOSIS — C3490 Malignant neoplasm of unspecified part of unspecified bronchus or lung: Secondary | ICD-10-CM

## 2016-12-15 HISTORY — DX: Malignant neoplasm of unspecified part of unspecified bronchus or lung: C34.90

## 2016-12-22 DIAGNOSIS — M545 Low back pain: Secondary | ICD-10-CM | POA: Diagnosis not present

## 2016-12-26 ENCOUNTER — Ambulatory Visit
Admission: RE | Admit: 2016-12-26 | Discharge: 2016-12-26 | Disposition: A | Discharge status: Home / Self Care | Payer: Medicare Other | Source: Ambulatory Visit | Attending: Nurse Practitioner | Admitting: Nurse Practitioner

## 2016-12-26 ENCOUNTER — Other Ambulatory Visit: Payer: Self-pay

## 2016-12-26 ENCOUNTER — Encounter: Payer: Self-pay | Admitting: Nurse Practitioner

## 2016-12-26 ENCOUNTER — Ambulatory Visit: Payer: Medicare Other | Attending: Nurse Practitioner | Admitting: Nurse Practitioner

## 2016-12-26 VITALS — BP 137/85 | HR 89 | Temp 97.6°F | Resp 16 | Ht 65.0 in | Wt 224.0 lb

## 2016-12-26 DIAGNOSIS — Z888 Allergy status to other drugs, medicaments and biological substances status: Secondary | ICD-10-CM | POA: Insufficient documentation

## 2016-12-26 DIAGNOSIS — G8929 Other chronic pain: Secondary | ICD-10-CM | POA: Diagnosis not present

## 2016-12-26 DIAGNOSIS — Z87891 Personal history of nicotine dependence: Secondary | ICD-10-CM | POA: Diagnosis not present

## 2016-12-26 DIAGNOSIS — K219 Gastro-esophageal reflux disease without esophagitis: Secondary | ICD-10-CM | POA: Insufficient documentation

## 2016-12-26 DIAGNOSIS — M25512 Pain in left shoulder: Secondary | ICD-10-CM

## 2016-12-26 DIAGNOSIS — E119 Type 2 diabetes mellitus without complications: Secondary | ICD-10-CM | POA: Diagnosis not present

## 2016-12-26 DIAGNOSIS — Z6837 Body mass index (BMI) 37.0-37.9, adult: Secondary | ICD-10-CM | POA: Diagnosis not present

## 2016-12-26 DIAGNOSIS — M25561 Pain in right knee: Secondary | ICD-10-CM | POA: Diagnosis not present

## 2016-12-26 DIAGNOSIS — G894 Chronic pain syndrome: Secondary | ICD-10-CM | POA: Diagnosis not present

## 2016-12-26 DIAGNOSIS — Z79899 Other long term (current) drug therapy: Secondary | ICD-10-CM | POA: Diagnosis not present

## 2016-12-26 DIAGNOSIS — I5042 Chronic combined systolic (congestive) and diastolic (congestive) heart failure: Secondary | ICD-10-CM | POA: Diagnosis not present

## 2016-12-26 DIAGNOSIS — Z9889 Other specified postprocedural states: Secondary | ICD-10-CM | POA: Diagnosis not present

## 2016-12-26 DIAGNOSIS — J449 Chronic obstructive pulmonary disease, unspecified: Secondary | ICD-10-CM | POA: Insufficient documentation

## 2016-12-26 DIAGNOSIS — M25511 Pain in right shoulder: Secondary | ICD-10-CM | POA: Insufficient documentation

## 2016-12-26 DIAGNOSIS — I493 Ventricular premature depolarization: Secondary | ICD-10-CM | POA: Insufficient documentation

## 2016-12-26 DIAGNOSIS — Z79891 Long term (current) use of opiate analgesic: Secondary | ICD-10-CM | POA: Insufficient documentation

## 2016-12-26 DIAGNOSIS — M19011 Primary osteoarthritis, right shoulder: Secondary | ICD-10-CM | POA: Diagnosis not present

## 2016-12-26 DIAGNOSIS — M25562 Pain in left knee: Secondary | ICD-10-CM

## 2016-12-26 DIAGNOSIS — M19012 Primary osteoarthritis, left shoulder: Secondary | ICD-10-CM | POA: Diagnosis not present

## 2016-12-26 DIAGNOSIS — Z9849 Cataract extraction status, unspecified eye: Secondary | ICD-10-CM | POA: Insufficient documentation

## 2016-12-26 DIAGNOSIS — E785 Hyperlipidemia, unspecified: Secondary | ICD-10-CM | POA: Diagnosis not present

## 2016-12-26 DIAGNOSIS — M25521 Pain in right elbow: Secondary | ICD-10-CM | POA: Diagnosis not present

## 2016-12-26 DIAGNOSIS — I110 Hypertensive heart disease with heart failure: Secondary | ICD-10-CM | POA: Diagnosis not present

## 2016-12-26 DIAGNOSIS — Z8249 Family history of ischemic heart disease and other diseases of the circulatory system: Secondary | ICD-10-CM | POA: Diagnosis not present

## 2016-12-26 MED ORDER — ORPHENADRINE CITRATE 30 MG/ML IJ SOLN
60.0000 mg | Freq: Once | INTRAMUSCULAR | Status: AC
  Administered 2016-12-26: 60 mg via INTRAMUSCULAR
  Filled 2016-12-26: qty 2

## 2016-12-26 MED ORDER — KETOROLAC TROMETHAMINE 60 MG/2ML IM SOLN
60.0000 mg | Freq: Once | INTRAMUSCULAR | Status: AC
  Administered 2016-12-26: 60 mg via INTRAMUSCULAR
  Filled 2016-12-26: qty 2

## 2016-12-26 NOTE — Progress Notes (Signed)
Safety precautions to be maintained throughout the outpatient stay will include: orient to surroundings, keep bed in low position, maintain call bell within reach at all times, provide assistance with transfer out of bed and ambulation.  

## 2016-12-26 NOTE — Patient Instructions (Addendum)
____________________________________________________________________________________________  Pain Scale  Introduction: The pain score used by this practice is the Verbal Numerical Rating Scale (VNRS-11). This is an 11-point scale. It is for adults and children 10 years or older. There are significant differences in how the pain score is reported, used, and applied. Forget everything you learned in the past and learn this scoring system.  General Information: The scale should reflect your current level of pain. Unless you are specifically asked for the level of your worst pain, or your average pain. If you are asked for one of these two, then it should be understood that it is over the past 24 hours.  Basic Activities of Daily Living (ADL): Personal hygiene, dressing, eating, transferring, and using restroom.  Instructions: Most patients tend to report their level of pain as a combination of two factors, their physical pain and their psychosocial pain. This last one is also known as "suffering" and it is reflection of how physical pain affects you socially and psychologically. From now on, report them separately. From this point on, when asked to report your pain level, report only your physical pain. Use the following table for reference.  Pain Clinic Pain Levels (0-5/10)  Pain Level Score  Description  No Pain 0   Mild pain 1 Nagging, annoying, but does not interfere with basic activities of daily living (ADL). Patients are able to eat, bathe, get dressed, toileting (being able to get on and off the toilet and perform personal hygiene functions), transfer (move in and out of bed or a chair without assistance), and maintain continence (able to control bladder and bowel functions). Blood pressure and heart rate are unaffected. A normal heart rate for a healthy adult ranges from 60 to 100 bpm (beats per minute).   Mild to moderate pain 2 Noticeable and distracting. Impossible to hide from other  people. More frequent flare-ups. Still possible to adapt and function close to normal. It can be very annoying and may have occasional stronger flare-ups. With discipline, patients may get used to it and adapt.   Moderate pain 3 Interferes significantly with activities of daily living (ADL). It becomes difficult to feed, bathe, get dressed, get on and off the toilet or to perform personal hygiene functions. Difficult to get in and out of bed or a chair without assistance. Very distracting. With effort, it can be ignored when deeply involved in activities.   Moderately severe pain 4 Impossible to ignore for more than a few minutes. With effort, patients may still be able to manage work or participate in some social activities. Very difficult to concentrate. Signs of autonomic nervous system discharge are evident: dilated pupils (mydriasis); mild sweating (diaphoresis); sleep interference. Heart rate becomes elevated (>115 bpm). Diastolic blood pressure (lower number) rises above 100 mmHg. Patients find relief in laying down and not moving.   Severe pain 5 Intense and extremely unpleasant. Associated with frowning face and frequent crying. Pain overwhelms the senses.  Ability to do any activity or maintain social relationships becomes significantly limited. Conversation becomes difficult. Pacing back and forth is common, as getting into a comfortable position is nearly impossible. Pain wakes you up from deep sleep. Physical signs will be obvious: pupillary dilation; increased sweating; goosebumps; brisk reflexes; cold, clammy hands and feet; nausea, vomiting or dry heaves; loss of appetite; significant sleep disturbance with inability to fall asleep or to remain asleep. When persistent, significant weight loss is observed due to the complete loss of appetite and sleep deprivation.  Blood   pressure and heart rate becomes significantly elevated. Caution: If elevated blood pressure triggers a pounding headache  associated with blurred vision, then the patient should immediately seek attention at an urgent or emergency care unit, as these may be signs of an impending stroke.    Emergency Department Pain Levels (6-10/10)  Emergency Room Pain 6 Severely limiting. Requires emergency care and should not be seen or managed at an outpatient pain management facility. Communication becomes difficult and requires great effort. Assistance to reach the emergency department may be required. Facial flushing and profuse sweating along with potentially dangerous increases in heart rate and blood pressure will be evident.   Distressing pain 7 Self-care is very difficult. Assistance is required to transport, or use restroom. Assistance to reach the emergency department will be required. Tasks requiring coordination, such as bathing and getting dressed become very difficult.   Disabling pain 8 Self-care is no longer possible. At this level, pain is disabling. The individual is unable to do even the most "basic" activities such as walking, eating, bathing, dressing, transferring to a bed, or toileting. Fine motor skills are lost. It is difficult to think clearly.   Incapacitating pain 9 Pain becomes incapacitating. Thought processing is no longer possible. Difficult to remember your own name. Control of movement and coordination are lost.   The worst pain imaginable 10 At this level, most patients pass out from pain. When this level is reached, collapse of the autonomic nervous system occurs, leading to a sudden drop in blood pressure and heart rate. This in turn results in a temporary and dramatic drop in blood flow to the brain, leading to a loss of consciousness. Fainting is one of the body's self defense mechanisms. Passing out puts the brain in a calmed state and causes it to shut down for a while, in order to begin the healing process.    Summary: 1. Refer to this scale when providing [place] with your pain level. 2. Be  accurate and careful when reporting your pain level. This will help with your care. 3. Over-reporting your pain level will lead to loss of credibility. 4. Even a level of 1/10 means that there is pain and will be treated at our facility. 5. High, inaccurate reporting will be documented as "Symptom Exaggeration", leading to loss of credibility and suspicions of possible secondary gains such as obtaining more narcotics, or wanting to appear disabled, for fraudulent reasons. 6. Only pain levels of 5 or below will be seen at our facility. 7. Pain levels of 6 and above will be sent to the Emergency Department and the appointment cancelled. ____________________________________________________________________________________________   BMI Assessment: Estimated body mass index is 37.28 kg/m as calculated from the following:   Height as of this encounter: 5\' 5"  (1.651 m).   Weight as of this encounter: 224 lb (101.6 kg).  BMI interpretation table: BMI level Category Range association with higher incidence of chronic pain  <18 kg/m2 Underweight   18.5-24.9 kg/m2 Ideal body weight   25-29.9 kg/m2 Overweight Increased incidence by 20%  30-34.9 kg/m2 Obese (Class I) Increased incidence by 68%  35-39.9 kg/m2 Severe obesity (Class II) Increased incidence by 136%  >40 kg/m2 Extreme obesity (Class III) Increased incidence by 254%   BMI Readings from Last 4 Encounters:  12/26/16 37.28 kg/m  12/07/16 37.28 kg/m  11/22/16 37.77 kg/m  11/21/16 37.82 kg/m   Wt Readings from Last 4 Encounters:  12/26/16 224 lb (101.6 kg)  12/07/16 224 lb (101.6 kg)  11/22/16  227 lb (103 kg)  11/21/16 227 lb 4.8 oz (103.1 kg)   Preparing for your procedure (without sedation) Instructions: . Oral Intake: Do not eat or drink anything for at least 3 hours prior to your procedure. . Transportation: Unless otherwise stated by your physician, you may drive yourself after the procedure. . Blood Pressure Medicine: Take  your blood pressure medicine with a sip of water the morning of the procedure. . Insulin: Take only  of your normal insulin dose. . Preventing infections: Shower with an antibacterial soap the morning of your procedure. . Build-up your immune system: Take 1000 mg of Vitamin C with every meal (3 times a day) the day prior to your procedure. . Pregnancy: If you are pregnant, call and cancel the procedure. . Sickness: If you have a cold, fever, or any active infections, call and cancel the procedure. . Arrival: You must be in the facility at least 30 minutes prior to your scheduled procedure. . Children: Do not bring any children with you. . Dress appropriately: Bring dark clothing that you would not mind if they get stained. . Valuables: Do not bring any jewelry or valuables. Procedure appointments are reserved for interventional treatments only. Marland Kitchen No Prescription Refills. . No medication changes will be discussed during procedure appointments. . No disability issues will be discussed.

## 2016-12-26 NOTE — Progress Notes (Signed)
Patient's Name: Grace [name]  MRN: [id number]  Referring Provider: [name], MD  DOB: [birth year]  PCP: [name], MD  DOS: 12/26/2016  Note by: Vevelyn Francois NP  Service setting: Ambulatory outpatient  Specialty: Interventional Pain Management  Location: [facility] (AMB) Pain Management Facility    Patient type: Established    Primary Reason(s) for Visit: Evaluation of chronic illnesses with exacerbation, or progression (Level of risk: moderate) CC: Shoulder Pain (bilateral) and Elbow Pain (right)  HPI  Grace [name] is a 66 y.o. year old, female patient, who comes today for a follow-up evaluation. 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); Lumbar stenosis with neurogenic claudication; Breast cyst; Cardiomyopathy ([place]); Diabetes mellitus type 2, uncomplicated ([place]); Fibroids; Hidradenitis; Nicotine abuse; Osteoarthritis cervical spine; Osteoarthritis of hands (Bilateral); PUD (peptic ulcer disease); Pulmonary nodule; Chronic knee pain (Primary Area of Pain) (Bilateral); Chronic pain syndrome; Osteoarthritis of knees (Bilateral); Thoracic compression fracture (T4, T5, T6, and T7); Long term prescription benzodiazepine use; Long term prescription opiate use; Opiate use; NSAID long-term use; Chronic shoulder pain; and Chronic elbow pain on their problem list. Ms. Keelin was last seen on 11/21/2016. Her primarily concern today is the Shoulder Pain (bilateral) and Elbow Pain (right)  Pain Assessment: Location: Right Shoulder Radiating: radiates down both arms, right worse than left Onset: More than a month ago Duration: Chronic pain Quality: Sharp Severity: 6 /10  (self-reported pain score)  Note: Reported level is compatible with observation. Clinically the patient looks like a             Information on the proper use of the pain scale provided to the patient today. When using our objective Pain Scale, levels between 6 and 10/10 are said to belong in an emergency room, as it progressively worsens from a 6/10, described as severely limiting, requiring emergency care not usually available at an outpatient pain management facility. At a 6/10 level, communication becomes difficult and requires great effort. Assistance to reach the emergency department may be required. Facial flushing and profuse sweating along with potentially dangerous increases in heart rate and blood pressure will be evident. Effect on ADL: pain comes with movement Timing: Intermittent Modifying factors: being still  Further details on both, my assessment(s), as well as the proposed treatment plan, please see below. She is having right shoulder and and elbow pain. She states that this has been going for 2 months. She states that using the walker has made them worse. She denies any numbness or tingling or weakness. She denies any injury or past problems. She   Laboratory Chemistry  Inflammation Markers (CRP: Acute Phase) (ESR: Chronic Phase) No results found for: CRP, ESRSEDRATE               Renal Function Markers Lab Results  Component Value Date   BUN 10 10/11/2016   CREATININE 0.75 10/11/2016   GFRAA 97 10/11/2016   GFRNONAA 84 10/11/2016                 Hepatic Function Markers Lab Results  Component Value Date   AST 22 08/23/2014   ALT 15 08/23/2014   ALBUMIN 3.7 08/23/2014   ALKPHOS 83 08/23/2014                 Electrolytes  Lab Results  Component Value Date   NA 140 10/11/2016   K 4.1 10/11/2016   CL 100 10/11/2016   CALCIUM 9.3 10/11/2016                 Neuropathy Markers No results found for: [id number]               Bone Pathology Markers Lab Results   Component Value Date   ALKPHOS 83 08/23/2014   CALCIUM 9.3 10/11/2016                 Rheumatology Markers No results found for: [facility]              Coagulation Parameters Lab Results  Component Value Date   PLT 237 07/25/2016                 Cardiovascular Markers Lab Results  Component Value Date   BNP 38.0 08/22/2014   TROPONINI <0.03 08/22/2014   HGB 13.9 07/25/2016   HCT 42.7 07/25/2016                 CA Markers No results found for: CEA, CA125, LABCA2               Note: Lab results reviewed.  Recent Diagnostic Imaging Review  Thoracic Imaging: Thoracic MR wo contrast:  Results for orders placed during the hospital encounter of 03/14/16  MR THORACIC SPINE WO CONTRAST   Narrative CLINICAL DATA:  Bilateral buttock and leg pain with left leg numbness. Neurogenic claudication. History of thoracic compression fracture.  EXAM: MRI THORACIC AND LUMBAR SPINE WITHOUT CONTRAST  TECHNIQUE: Multiplanar and multiecho pulse sequences of the thoracic and lumbar spine were obtained without intravenous contrast.  COMPARISON:  MRI thoracic spine 07/15/2014 and CT thoracic spine 07/15/2014.  FINDINGS: MRI THORACIC SPINE FINDINGS  Alignment: Progressive kyphotic deformity in the upper thoracic spine centered at T5. There is a remote severe compression fracture of T5 with vertebral plana. Moderate retropulsion and mild canal encroachment appears relatively stable when compared to the prior thoracic spine CT scan. There is however progressive degenerative anterior subluxation of T4. Stable mild compression fractures of T4, T6 and T7. No new/acute thoracic compression fractures.  Vertebrae: Normal marrow signal. Remote compression fractures. No new/ acute fracture or bone lesion.  Cord: Grossly normal thoracic spinal cord. No cord lesions or syrinx.  Paraspinal and other soft tissues: No significant findings.  Disc levels:  Retropulsion of the severely  compressed T5 vertebral body with canal encroachment and slight flattening of the thoracic spinal cord.  No significant thoracic disc protrusions or other levels of spinal or foraminal stenosis.  MRI LUMBAR SPINE FINDINGS  Segmentation: 5 lumbar type vertebral bodies. The last full intervertebral disc space is labeled L5-S1.  Alignment:  Normal  Vertebrae: Minimal endplate reactive changes at L3-4. No worrisome bone lesions or acute fracture. Remote appearing mild compression deformity of L1.  Conus medullaris: Extends to the L1 level and appears normal.  Paraspinal and other soft tissues: No significant findings  Disc levels:  L1-2:  No significant findings.  Mild to moderate facet disease.  L2-3: Disc disease and facet disease with a slight bulging annulus. Mild lateral recess encroachment bilaterally but no significant spinal or foraminal stenosis.  L3-4: Disc disease and facet disease. Bulging annulus, osteophytic ridging and short pedicles with mild bilateral lateral recess stenosis but no significant spinal or foraminal stenosis.  L4-5: Diffuse bulging annulus, short pedicles and advanced facet disease contributing  to moderate to moderately severe spinal and bilateral lateral recess stenosis. Mild foraminal stenosis bilaterally also.  L5-S1: Facet disease but no disc protrusions, spinal or foraminal stenosis.  IMPRESSION: MR THORACIC SPINE IMPRESSION  1. Remote severe compression fracture of T5 with vertebral plana. Stable retropulsion and mild canal encroachment. Slight flattening of the ventral thoracic cord. 2. Progressive kyphosis and degenerative anterior subluxation of T4. 3. No cord lesions, cord edema or syrinx.  MR LUMBAR SPINE IMPRESSION  1. Mild lateral recess encroachment bilaterally at L2-3 and L3-4. 2. Moderate to moderately severe multifactorial spinal and bilateral lateral recess stenosis at L4-5. There is also mild bilateral foraminal  stenosis.   Electronically Signed   By: Marijo Sanes M.D.   On: 03/14/2016 10:15    Thoracic MR w/wo contrast:  Results for orders placed during the hospital encounter of 07/15/14  MR Thoracic Spine W Wo Contrast   Narrative CLINICAL DATA:  [place] on 07/15/2014. Bilateral lower extremity weakness.  EXAM: MRI THORACIC SPINE WITHOUT AND WITH CONTRAST  TECHNIQUE: Multiplanar and multiecho pulse sequences of the thoracic spine were obtained without and with intravenous contrast.  CONTRAST:  16 cc MultiHance  COMPARISON:  Thoracic spine radiographs 07/02/2014 and 07/15/2014  FINDINGS: There is a vertebral plana appearance of the T5 vertebral body without obvious enhancement. This is likely a remote compression fracture. There is also a remote compression fracture of T7. Mild compression deformity of T6. There is marrow signal abnormality in the T4 vertebral body along with contrast enhancement. There is also some contrast enhancement in the upper aspect of T6. There is also significant enhancement around the vertebral bodies.  The patient has severe underlying epidural lipomatosis and that in combination with the compression deformity and possible hematoma, edema or tumor ventrally is contributing to severe cord compression. There is no appreciable CSF identified around the cervical spinal cord extending from the midbody of T4 down to the lower aspect of T6.  No cord signal abnormality is demonstrated or abnormal cord enhancement. The other thoracic vertebral bodies are maintained. No foraminal lesions.  IMPRESSION: 1. Significant cord compression from the midbody of T4 to the bottom of T6. This may all be related to trauma with edema, fluid and hemorrhage but could not exclude underlying pathologic process such as infection or tumor. Recommend neurosurgical consultation. The patient's cord compression is compound did by a significant epidural lipomatosis. 2. Remote  appearing compression fractures of T5 and T7. 3. Mild edema and enhancement in the T4 and T6 vertebral bodies could be related to trauma/reactive changes or possible underlying pathologic process.   Electronically Signed   By: Marijo Sanes M.D.   On: 07/15/2014 11:07    Thoracic CT wo contrast:  Results for orders placed during the hospital encounter of 07/15/14  CT Thoracic Spine Wo Contrast   Narrative CLINICAL DATA:  Thoracic spine pain, known fractures. Known cord compression.  EXAM: CT THORACIC SPINE WITHOUT CONTRAST  TECHNIQUE: Multidetector CT imaging of the thoracic spine was performed without intravenous contrast administration. Multiplanar CT image reconstructions were also generated.  COMPARISON:  MRI thoracic spine from [facility] Jul 15, 2014 at 0958 hours  FINDINGS: Again noted is T5 vertebral plana with vacuum disc and intraosseous air. Moderate T7 compression fracture with approximately 50% anterior wedging. Mild T6 superior endplate compression fracture with less than 20% superior endplate height loss. The remaining thoracic vertebral bodies intact. Accentuated thoracic kyphosis at T5. No malalignment. Osteopenia without destructive bony lesions.  Prominent posterior epidural fat.  Included prevertebral and paraspinal soft tissues are nonsuspicious. Moderate calcific atherosclerosis of the included thoracic aorta.  Retropulsed bony fragments at T5 results in very mild osseous canal stenosis. Severe RIGHT, moderate LEFT T5-6 neural foraminal narrowing. Mild LEFT T4-5 neural foraminal narrowing.  IMPRESSION: T5 vertebral plana, with benign imaging features. Moderate T7 and mild T6 compression fractures. Osteopenia suggesting fracture insufficiency etiology. Accentuated upper thoracic kyphosis without malalignment.  Very mild osseous canal stenosis at T5. Severe RIGHT, moderate LEFT T5-6 neural foraminal narrowing. Mild LEFT T4-5 neural  foraminal narrowing.   Electronically Signed   By: Elon Alas M.D.   On: 07/15/2014 23:37    Thoracic DG 2-3 views:  Results for orders placed during the hospital encounter of 07/15/14  DG Thoracic Spine 2 View   Narrative CLINICAL DATA:  Fall this morning now with back pain. Known T5 and T7 fracture post fall 1 week prior.  EXAM: THORACIC SPINE - 2 VIEW  COMPARISON:  Thoracic spine radiographs 07/02/2014  FINDINGS: Compression deformities of T5 and T7 are unchanged compared to prior exam. No new fracture. The alignment is maintained. Mild endplate spurring and disc space narrowing in the mid lower thoracic spine. No paravertebral soft tissue abnormality.  IMPRESSION: Unchanged compression deformities of T5 and T7.  No acute fracture.   Electronically Signed   By: Jeb Levering M.D.   On: 07/15/2014 06:35     Lumbosacral Imaging: Lumbar MR wo contrast:  Results for orders placed during the hospital encounter of 03/14/16  MR LUMBAR SPINE WO CONTRAST   Narrative CLINICAL DATA:  Bilateral buttock and leg pain with left leg numbness. Neurogenic claudication. History of thoracic compression fracture.  EXAM: MRI THORACIC AND LUMBAR SPINE WITHOUT CONTRAST  TECHNIQUE: Multiplanar and multiecho pulse sequences of the thoracic and lumbar spine were obtained without intravenous contrast.  COMPARISON:  MRI thoracic spine 07/15/2014 and CT thoracic spine 07/15/2014.  FINDINGS: MRI THORACIC SPINE FINDINGS  Alignment: Progressive kyphotic deformity in the upper thoracic spine centered at T5. There is a remote severe compression fracture of T5 with vertebral plana. Moderate retropulsion and mild canal encroachment appears relatively stable when compared to the prior thoracic spine CT scan. There is however progressive degenerative anterior subluxation of T4. Stable mild compression fractures of T4, T6 and T7. No new/acute thoracic compression  fractures.  Vertebrae: Normal marrow signal. Remote compression fractures. No new/ acute fracture or bone lesion.  Cord: Grossly normal thoracic spinal cord. No cord lesions or syrinx.  Paraspinal and other soft tissues: No significant findings.  Disc levels:  Retropulsion of the severely compressed T5 vertebral body with canal encroachment and slight flattening of the thoracic spinal cord.  No significant thoracic disc protrusions or other levels of spinal or foraminal stenosis.  MRI LUMBAR SPINE FINDINGS  Segmentation: 5 lumbar type vertebral bodies. The last full intervertebral disc space is labeled L5-S1.  Alignment:  Normal  Vertebrae: Minimal endplate reactive changes at L3-4. No worrisome bone lesions or acute fracture. Remote appearing mild compression deformity of L1.  Conus medullaris: Extends to the L1 level and appears normal.  Paraspinal and other soft tissues: No significant findings  Disc levels:  L1-2:  No significant findings.  Mild to moderate facet disease.  L2-3: Disc disease and facet disease with a slight bulging annulus. Mild lateral recess encroachment bilaterally but no significant spinal or foraminal stenosis.  L3-4: Disc disease and facet disease. Bulging annulus, osteophytic ridging and short pedicles with mild bilateral lateral recess stenosis but no  significant spinal or foraminal stenosis.  L4-5: Diffuse bulging annulus, short pedicles and advanced facet disease contributing to moderate to moderately severe spinal and bilateral lateral recess stenosis. Mild foraminal stenosis bilaterally also.  L5-S1: Facet disease but no disc protrusions, spinal or foraminal stenosis.  IMPRESSION: MR THORACIC SPINE IMPRESSION  1. Remote severe compression fracture of T5 with vertebral plana. Stable retropulsion and mild canal encroachment. Slight flattening of the ventral thoracic cord. 2. Progressive kyphosis and degenerative anterior  subluxation of T4. 3. No cord lesions, cord edema or syrinx.  MR LUMBAR SPINE IMPRESSION  1. Mild lateral recess encroachment bilaterally at L2-3 and L3-4. 2. Moderate to moderately severe multifactorial spinal and bilateral lateral recess stenosis at L4-5. There is also mild bilateral foraminal stenosis.   Electronically Signed   By: Marijo Sanes M.D.   On: 03/14/2016 10:15    Lumbar DG 1V:  Results for orders placed during the hospital encounter of 08/01/16  DG Lumbar Spine 1 View   Narrative CLINICAL DATA:  Elective surgery.  EXAM: LUMBAR SPINE - 1 VIEW  COMPARISON:  MRI of 03/14/2016  FINDINGS: Single lateral view labeled 0833 hours. This demonstrates a surgical device projecting posterior to the lumbosacral junction and inferior aspect of L5. Degenerative disc disease is most significant at L3-4 and L5-S1. Mild superior endplate compression deformity at L1 which is not significantly changed.  IMPRESSION: Intraoperative localization of L5-S1.   Electronically Signed   By: Abigail Miyamoto M.D.   On: 08/01/2016 10:09     Complexity Note: Imaging results reviewed. Results shared with Ms. Strawder, using Layman's terms.                         Meds   Current Outpatient Medications:  .  albuterol (PROAIR HFA) 108 (90 Base) MCG/ACT inhaler, Inhale into the lungs., Disp: , Rfl:  .  alendronate (FOSAMAX) 70 MG tablet, Take 70 mg by mouth every Monday., Disp: , Rfl:  .  ARIPiprazole (ABILIFY) 5 MG tablet, Take 5 mg by mouth daily., Disp: , Rfl:  .  diltiazem (CARDIZEM) 30 MG tablet, Take 1 tablet (30 mg total) by mouth 3 (three) times daily as needed., Disp: 90 tablet, Rfl: 3 .  DULoxetine (CYMBALTA) 60 MG capsule, Take 60 mg by mouth daily., Disp: , Rfl:  .  ibuprofen (ADVIL,MOTRIN) 200 MG tablet, Take 800 mg by mouth every 8 (eight) hours as needed (for pain.)., Disp: , Rfl:  .  LORazepam (ATIVAN) 0.5 MG tablet, Take 0.5 mg by mouth daily as needed for anxiety. ,  Disp: , Rfl:  .  metoprolol succinate (TOPROL-XL) 100 MG 24 hr tablet, TAKE ONE TABLET BY MOUTH ONCE DAILY, Disp: 30 tablet, Rfl: 0 .  modafinil (PROVIGIL) 200 MG tablet, Take 200 mg by mouth daily as needed (for alertness 3rd shift). , Disp: , Rfl:  .  oxyCODONE-acetaminophen (PERCOCET/ROXICET) 5-325 MG tablet, Take 1 tablet by mouth every 6 (six) hours as needed. For pain., Disp: , Rfl: 0 .  sacubitril-valsartan (ENTRESTO) 49-51 MG, Take 1 tablet by mouth 2 (two) times daily., Disp: 60 tablet, Rfl: 6  Current Facility-Administered Medications:  .  ketorolac (TORADOL) injection 60 mg, 60 mg, Intramuscular, Once, Joan Herschberger, [employer], NP .  orphenadrine (NORFLEX) injection 60 mg, 60 mg, Intramuscular, Once, Tahra Hitzeman, [employer], NP  ROS  Constitutional: Denies any fever or chills Gastrointestinal: No reported hemesis, hematochezia, vomiting, or acute GI distress Musculoskeletal: Denies any acute onset joint swelling,  redness, loss of ROM, or weakness Neurological: No reported episodes of acute onset apraxia, aphasia, dysarthria, agnosia, amnesia, paralysis, loss of coordination, or loss of consciousness  Allergies  Ms. Cope is allergic to iodine.  [place]  Drug: Ms. Hannig  reports that she does not use drugs. Alcohol:  reports that she does not drink alcohol. Tobacco:  reports that she has quit smoking. Her smoking use included cigarettes. She has a 25.00 pack-year smoking history. she has never used smokeless tobacco. Medical:  has a past medical history of Anxiety, Arthritis, Asthma, CAP (community acquired pneumonia) (06/30/2014), Cardiomyopathy ([place]), COPD (chronic obstructive pulmonary disease) ([place]), Depression, Dyspnea, GERD (gastroesophageal reflux disease), Hyperlipidemia, Hypertension, Pneumonia, PVC's (premature ventricular contractions), Sepsis ([place]) (06/30/2014), SOB (shortness of breath) (05/08/2014), and Spinal cord injury at T7-T12 level [facility]). Surgical: Ms. Sanford  has a past surgical  history that includes Cardiac catheterization; Cesarean section; Appendectomy; Cataract extraction; Colonoscopy; Laminotomy; and Left Lumbar Four-Five Laminectomy and Foraminotomy (Left, 08/01/2016). Family: family history includes Heart attack (age of onset: 43) in her father.  Constitutional Exam  General appearance: Well nourished, well developed, and well hydrated. In no apparent acute distress Vitals:   12/26/16 1410  BP: 137/85  Pulse: 89  Resp: 16  Temp: 97.6 F (36.4 C)  SpO2: 96%  Weight: 224 lb (101.6 kg)  Height: '5\' 5"'$  (1.651 m)   BMI Assessment: Estimated body mass index is 37.28 kg/m as calculated from the following:   Height as of this encounter: '5\' 5"'$  (1.651 m).   Weight as of this encounter: 224 lb (101.6 kg). Psych/Mental status: Alert, oriented x 3 (person, place, & time)       Eyes: PERLA Respiratory: No evidence of acute respiratory distress  Cervical Spine Area Exam  Skin & Axial Inspection: No masses, redness, edema, swelling, or associated skin lesions Alignment: Symmetrical Functional ROM: Unrestricted ROM      Stability: No instability detected Muscle Tone/Strength: Functionally intact. No obvious neuro-muscular anomalies detected. Sensory (Neurological): Unimpaired Palpation: No palpable anomalies              Upper Extremity (UE) Exam    Side: Right upper extremity  Side: Left upper extremity  Skin & Extremity Inspection: Skin color, temperature, and hair growth are WNL. No peripheral edema or cyanosis. No masses, redness, swelling, asymmetry, or associated skin lesions. No contractures.  Skin & Extremity Inspection: Skin color, temperature, and hair growth are WNL. No peripheral edema or cyanosis. No masses, redness, swelling, asymmetry, or associated skin lesions. No contractures.  Functional ROM: Decreased ROM          Functional ROM: Decreased ROM          Muscle Tone/Strength: Movement possible against some resistance (4/5)  Muscle Tone/Strength:  Movement possible against some resistance (4/5)  Sensory (Neurological): Unimpaired          Sensory (Neurological): Unimpaired          Palpation: Tender              Palpation: No palpable anomalies              Specialized Test(s): Deferred         Specialized Test(s): Deferred          Thoracic Spine Area Exam  Skin & Axial Inspection: No masses, redness, or swelling Alignment: Symmetrical Functional ROM: Unrestricted ROM Stability: No instability detected Muscle Tone/Strength: Functionally intact. No obvious neuro-muscular anomalies detected. Sensory (Neurological): Unimpaired Muscle strength & Tone: No palpable  anomalies  Gait & Posture Assessment  Ambulation: Patient ambulates using a wheel chair Gait: Relatively normal for age and body habitus Posture: WNL   Lower Extremity Exam    Side: Right lower extremity  Side: Left lower extremity  Skin & Extremity Inspection: Skin color, temperature, and hair growth are WNL. No peripheral edema or cyanosis. No masses, redness, swelling, asymmetry, or associated skin lesions. No contractures.  Skin & Extremity Inspection: Skin color, temperature, and hair growth are WNL. No peripheral edema or cyanosis. No masses, redness, swelling, asymmetry, or associated skin lesions. No contractures.  Functional ROM: Unrestricted ROM          Functional ROM: Unrestricted ROM          Muscle Tone/Strength: Functionally intact. No obvious neuro-muscular anomalies detected.  Muscle Tone/Strength: Functionally intact. No obvious neuro-muscular anomalies detected.  Sensory (Neurological): Unimpaired  Sensory (Neurological): Unimpaired  Palpation: No palpable anomalies  Palpation: No palpable anomalies   Assessment  Primary Diagnosis & Pertinent Problem List: The primary encounter diagnosis was Chronic pain of both shoulders. Diagnoses of Chronic pain of right elbow, Chronic knee pain (Primary Area of Pain) (Bilateral), and Chronic pain syndrome were also  pertinent to this visit.  Status Diagnosis  Worsening Persistent Stable 1. Chronic pain of both shoulders   2. Chronic pain of right elbow   3. Chronic knee pain (Primary Area of Pain) (Bilateral)   4. Chronic pain syndrome     Problems updated and reviewed during this visit: Problem  Chronic Shoulder Pain  Osteoarthritis of knees (Bilateral)  Osteoarthritis Cervical Spine  Osteoarthritis of hands (Bilateral)  Chronic Pain Syndrome  Lumbar Stenosis With Neurogenic Claudication  Chronic Elbow Pain   Plan of Care  Pharmacotherapy (Medications Ordered): Meds ordered this encounter  Medications  . orphenadrine (NORFLEX) injection 60 mg  . ketorolac (TORADOL) injection 60 mg  This SmartLink is deprecated. Use AVSMEDLIST instead to display the medication list for a patient. Medications administered today: [name] had no medications administered during this visit. Lab-work, procedure(s), and/or referral(s): Orders Placed This Encounter  Procedures  . SHOULDER INJECTION  . Small Joint Injection/Arthrocentesis  . DG Shoulder Right  . DG Shoulder Left   Imaging and/or referral(s): DG SHOULDER RIGHT DG SHOULDER LEFT  Interventional management options: Planned, scheduled, and/or pending:   intra-articular right shoulder and right elbow injection    Considering:   Therapeutic bilateral intra-articular Hyalgan knee injection    Palliative PRN treatment(s):   Therapeutic bilateral intra-articular Hyalgan knee injection    Provider-requested follow-up: Return for Procedure(w/Sedation), w/ Dr. Dossie Arbour, (ASAP).  No future appointments. Primary Care Physician: [name], MD Location: [facility] Note by: Vevelyn Francois NP Date: 12/26/2016; Time: 2:51 PM  Pain Score Disclaimer: We use the NRS-11 scale. This is a self-reported, subjective measurement of pain severity with only modest accuracy. It is used primarily to identify  changes within a particular patient. It must be understood that outpatient pain scales are significantly less accurate that those used for research, where they can be applied under ideal controlled circumstances with minimal exposure to variables. In reality, the score is likely to be a combination of pain intensity and pain affect, where pain affect describes the degree of emotional arousal or changes in action readiness caused by the sensory experience of pain. Factors such as social and work situation, setting, emotional state, anxiety levels, expectation, and prior pain experience may influence pain perception and show large inter-individual differences that  may also be affected by time variables.  Patient instructions provided during this appointment: Patient Instructions   ____________________________________________________________________________________________  Pain Scale  Introduction: The pain score used by this practice is the Verbal Numerical Rating Scale (VNRS-11). This is an 11-point scale. It is for adults and children 10 years or older. There are significant differences in how the pain score is reported, used, and applied. Forget everything you learned in the past and learn this scoring system.  General Information: The scale should reflect your current level of pain. Unless you are specifically asked for the level of your worst pain, or your average pain. If you are asked for one of these two, then it should be understood that it is over the past 24 hours.  Basic Activities of Daily Living (ADL): Personal hygiene, dressing, eating, transferring, and using restroom.  Instructions: Most patients tend to report their level of pain as a combination of two factors, their physical pain and their psychosocial pain. This last one is also known as "suffering" and it is reflection of how physical pain affects you socially and psychologically. From now on, report them separately. From this  point on, when asked to report your pain level, report only your physical pain. Use the following table for reference.  Pain Clinic Pain Levels (0-5/10)  Pain Level Score  Description  No Pain 0   Mild pain 1 Nagging, annoying, but does not interfere with basic activities of daily living (ADL). Patients are able to eat, bathe, get dressed, toileting (being able to get on and off the toilet and perform personal hygiene functions), transfer (move in and out of bed or a chair without assistance), and maintain continence (able to control bladder and bowel functions). Blood pressure and heart rate are unaffected. A normal heart rate for a healthy adult ranges from 60 to 100 bpm (beats per minute).   Mild to moderate pain 2 Noticeable and distracting. Impossible to hide from other people. More frequent flare-ups. Still possible to adapt and function close to normal. It can be very annoying and may have occasional stronger flare-ups. With discipline, patients may get used to it and adapt.   Moderate pain 3 Interferes significantly with activities of daily living (ADL). It becomes difficult to feed, bathe, get dressed, get on and off the toilet or to perform personal hygiene functions. Difficult to get in and out of bed or a chair without assistance. Very distracting. With effort, it can be ignored when deeply involved in activities.   Moderately severe pain 4 Impossible to ignore for more than a few minutes. With effort, patients may still be able to manage work or participate in some social activities. Very difficult to concentrate. Signs of autonomic nervous system discharge are evident: dilated pupils (mydriasis); mild sweating (diaphoresis); sleep interference. Heart rate becomes elevated (>115 bpm). Diastolic blood pressure (lower number) rises above 100 mmHg. Patients find relief in laying down and not moving.   Severe pain 5 Intense and extremely unpleasant. Associated with frowning face and frequent  crying. Pain overwhelms the senses.  Ability to do any activity or maintain social relationships becomes significantly limited. Conversation becomes difficult. Pacing back and forth is common, as getting into a comfortable position is nearly impossible. Pain wakes you up from deep sleep. Physical signs will be obvious: pupillary dilation; increased sweating; goosebumps; brisk reflexes; cold, clammy hands and feet; nausea, vomiting or dry heaves; loss of appetite; significant sleep disturbance with inability to fall asleep or to remain asleep.  When persistent, significant weight loss is observed due to the complete loss of appetite and sleep deprivation.  Blood pressure and heart rate becomes significantly elevated. Caution: If elevated blood pressure triggers a pounding headache associated with blurred vision, then the patient should immediately seek attention at an urgent or emergency care unit, as these may be signs of an impending stroke.    Emergency Department Pain Levels (6-10/10)  Emergency Room Pain 6 Severely limiting. Requires emergency care and should not be seen or managed at an outpatient pain management facility. Communication becomes difficult and requires great effort. Assistance to reach the emergency department may be required. Facial flushing and profuse sweating along with potentially dangerous increases in heart rate and blood pressure will be evident.   Distressing pain 7 Self-care is very difficult. Assistance is required to transport, or use restroom. Assistance to reach the emergency department will be required. Tasks requiring coordination, such as bathing and getting dressed become very difficult.   Disabling pain 8 Self-care is no longer possible. At this level, pain is disabling. The individual is unable to do even the most "basic" activities such as walking, eating, bathing, dressing, transferring to a bed, or toileting. Fine motor skills are lost. It is difficult to think  clearly.   Incapacitating pain 9 Pain becomes incapacitating. Thought processing is no longer possible. Difficult to remember your own name. Control of movement and coordination are lost.   The worst pain imaginable 10 At this level, most patients pass out from pain. When this level is reached, collapse of the autonomic nervous system occurs, leading to a sudden drop in blood pressure and heart rate. This in turn results in a temporary and dramatic drop in blood flow to the brain, leading to a loss of consciousness. Fainting is one of the body's self defense mechanisms. Passing out puts the brain in a calmed state and causes it to shut down for a while, in order to begin the healing process.    Summary: 1. Refer to this scale when providing [place] with your pain level. 2. Be accurate and careful when reporting your pain level. This will help with your care. 3. Over-reporting your pain level will lead to loss of credibility. 4. Even a level of 1/10 means that there is pain and will be treated at our facility. 5. High, inaccurate reporting will be documented as "Symptom Exaggeration", leading to loss of credibility and suspicions of possible secondary gains such as obtaining more narcotics, or wanting to appear disabled, for fraudulent reasons. 6. Only pain levels of 5 or below will be seen at our facility. 7. Pain levels of 6 and above will be sent to the Emergency Department and the appointment cancelled. ____________________________________________________________________________________________   BMI Assessment: Estimated body mass index is 37.28 kg/m as calculated from the following:   Height as of this encounter: '5\' 5"'$  (1.651 m).   Weight as of this encounter: 224 lb (101.6 kg).  BMI interpretation table: BMI level Category Range association with higher incidence of chronic pain  <18 kg/m2 Underweight   18.5-24.9 kg/m2 Ideal body weight   25-29.9 kg/m2 Overweight Increased incidence by 20%   30-34.9 kg/m2 Obese (Class I) Increased incidence by 68%  35-39.9 kg/m2 Severe obesity (Class II) Increased incidence by 136%  >40 kg/m2 Extreme obesity (Class III) Increased incidence by 254%   BMI Readings from Last 4 Encounters:  12/26/16 37.28 kg/m  12/07/16 37.28 kg/m  11/22/16 37.77 kg/m  11/21/16 37.82 kg/m   Wt  Readings from Last 4 Encounters:  12/26/16 224 lb (101.6 kg)  12/07/16 224 lb (101.6 kg)  11/22/16 227 lb (103 kg)  11/21/16 227 lb 4.8 oz (103.1 kg)

## 2016-12-27 ENCOUNTER — Encounter: Payer: Self-pay | Admitting: Pain Medicine

## 2016-12-27 ENCOUNTER — Other Ambulatory Visit: Payer: Self-pay

## 2016-12-27 ENCOUNTER — Ambulatory Visit (HOSPITAL_BASED_OUTPATIENT_CLINIC_OR_DEPARTMENT_OTHER): Payer: Medicare Other | Admitting: Pain Medicine

## 2016-12-27 ENCOUNTER — Ambulatory Visit
Admission: RE | Admit: 2016-12-27 | Discharge: 2016-12-27 | Disposition: A | Discharge status: Home / Self Care | Payer: Medicare Other | Source: Ambulatory Visit | Attending: Pain Medicine | Admitting: Pain Medicine

## 2016-12-27 VITALS — BP 109/80 | HR 85 | Temp 97.7°F | Resp 24 | Ht 65.0 in | Wt 224.0 lb

## 2016-12-27 DIAGNOSIS — Z79899 Other long term (current) drug therapy: Secondary | ICD-10-CM | POA: Diagnosis not present

## 2016-12-27 DIAGNOSIS — G8929 Other chronic pain: Secondary | ICD-10-CM | POA: Diagnosis not present

## 2016-12-27 DIAGNOSIS — Z9849 Cataract extraction status, unspecified eye: Secondary | ICD-10-CM | POA: Insufficient documentation

## 2016-12-27 DIAGNOSIS — M25512 Pain in left shoulder: Secondary | ICD-10-CM | POA: Insufficient documentation

## 2016-12-27 DIAGNOSIS — M545 Low back pain: Secondary | ICD-10-CM | POA: Diagnosis not present

## 2016-12-27 DIAGNOSIS — Z7983 Long term (current) use of bisphosphonates: Secondary | ICD-10-CM | POA: Insufficient documentation

## 2016-12-27 DIAGNOSIS — M19012 Primary osteoarthritis, left shoulder: Secondary | ICD-10-CM | POA: Insufficient documentation

## 2016-12-27 DIAGNOSIS — M19011 Primary osteoarthritis, right shoulder: Secondary | ICD-10-CM

## 2016-12-27 DIAGNOSIS — M25521 Pain in right elbow: Secondary | ICD-10-CM | POA: Insufficient documentation

## 2016-12-27 DIAGNOSIS — Z79891 Long term (current) use of opiate analgesic: Secondary | ICD-10-CM | POA: Diagnosis not present

## 2016-12-27 DIAGNOSIS — M25511 Pain in right shoulder: Secondary | ICD-10-CM

## 2016-12-27 DIAGNOSIS — M7701 Medial epicondylitis, right elbow: Secondary | ICD-10-CM | POA: Insufficient documentation

## 2016-12-27 MED ORDER — LIDOCAINE HCL 2 % IJ SOLN
20.0000 mL | Freq: Once | INTRAMUSCULAR | Status: AC
  Administered 2016-12-27: 400 mg
  Filled 2016-12-27: qty 100

## 2016-12-27 MED ORDER — ROPIVACAINE HCL 2 MG/ML IJ SOLN
4.0000 mL | Freq: Once | INTRAMUSCULAR | Status: DC
  Filled 2016-12-27: qty 10

## 2016-12-27 MED ORDER — METHYLPREDNISOLONE ACETATE 40 MG/ML IJ SUSP
40.0000 mg | Freq: Once | INTRAMUSCULAR | Status: AC
  Administered 2016-12-27: 40 mg via INTRA_ARTICULAR
  Filled 2016-12-27: qty 1

## 2016-12-27 MED ORDER — ROPIVACAINE HCL 2 MG/ML IJ SOLN
4.0000 mL | Freq: Once | INTRAMUSCULAR | Status: AC
  Administered 2016-12-27: 8 mL

## 2016-12-27 MED ORDER — TRIAMCINOLONE ACETONIDE 40 MG/ML IJ SUSP: 40.0000 mg | Freq: Once | INTRAMUSCULAR | Status: DC

## 2016-12-27 NOTE — Progress Notes (Signed)
Results were reviewed and found to be: mildly abnormal  No acute injury or pathology identified  Review would suggest interventional pain management techniques may be of benefit 

## 2016-12-27 NOTE — Progress Notes (Signed)
Safety precautions to be maintained throughout the outpatient stay will include: orient to surroundings, keep bed in low position, maintain call bell within reach at all times, provide assistance with transfer out of bed and ambulation.  

## 2016-12-27 NOTE — Patient Instructions (Addendum)
____________________________________________________________________________________________  Post-Procedure instructions Instructions:  Apply ice: Fill a plastic sandwich bag with crushed ice. Cover it with a small towel and apply to injection site. Apply for 15 minutes then remove x 15 minutes. Repeat sequence on day of procedure, until you go to bed. The purpose is to minimize swelling and discomfort after procedure.  Apply heat: Apply heat to procedure site starting the day following the procedure. The purpose is to treat any soreness and discomfort from the procedure.  Food intake: Start with clear liquids (like water) and advance to regular food, as tolerated.   Physical activities: Keep activities to a minimum for the first 8 hours after the procedure.   Driving: If you have received any sedation, you are not allowed to drive for 24 hours after your procedure.  Blood thinner: Restart your blood thinner 6 hours after your procedure. (Only for those taking blood thinners)  Insulin: As soon as you can eat, you may resume your normal dosing schedule. (Only for those taking insulin)  Infection prevention: Keep procedure site clean and dry.  Post-procedure Pain Diary: Extremely important that this be done correctly and accurately. Recorded information will be used to determine the next step in treatment.  Pain evaluated is that of treated area only. Do not include pain from an untreated area.  Complete every hour, on the hour, for the initial 8 hours. Set an alarm to help you do this part accurately.  Do not go to sleep and have it completed later. It will not be accurate.  Follow-up appointment: Keep your follow-up appointment after the procedure. Usually 2 weeks for most procedures. (6 weeks in the case of radiofrequency.) Bring you pain diary.  Expect:  From numbing medicine (AKA: Local Anesthetics): Numbness or decrease in pain.  Onset: Full effect within 15 minutes of  injected.  Duration: It will depend on the type of local anesthetic used. On the average, 1 to 8 hours.   From steroids: Decrease in swelling or inflammation. Once inflammation is improved, relief of the pain will follow.  Onset of benefits: Depends on the amount of swelling present. The more swelling, the longer it will take for the benefits to be seen. In some cases, up to 10 days.  Duration: Steroids will stay in the system x 2 weeks. Duration of benefits will depend on multiple posibilities including persistent irritating factors.  From procedure: Some discomfort is to be expected once the numbing medicine wears off. This should be minimal if ice and heat are applied as instructed. Call if:  You experience numbness and weakness that gets worse with time, as opposed to wearing off.  New onset bowel or bladder incontinence. (Spinal procedures only)  Emergency Numbers:  Durning business hours (Monday - Thursday, 8:00 AM - 4:00 PM) (Friday, 9:00 AM - 12:00 Noon): [phone]  After hours: [phone] ____________________________________________________________________________________________    Trigger Point Injection Trigger points are areas where you have pain. A trigger point injection is a shot given in the trigger point to help relieve pain for a few days to a few months. Common places for trigger points include:  The neck.  The shoulders.  The upper back.  The lower back.  A trigger point injection will not cure long-lasting (chronic) pain permanently. These injections do not always work for every person, but for some people they can help to relieve pain for a few days to a few months. Tell a health care provider about:  Any allergies you have.  All medicines you are taking, including vitamins, herbs, eye drops, creams, and over-the-counter medicines.  Any problems you or family members have had with anesthetic medicines.  Any blood disorders you have.  Any  surgeries you have had.  Any medical conditions you have. What are the risks? Generally, this is a safe procedure. However, problems may occur, including:  Infection.  Bleeding.  Allergic reaction to the injected medicine.  Irritation of the skin around the injection site.  What happens before the procedure?  Ask your health care provider about changing or stopping your regular medicines. This is especially important if you are taking diabetes medicines or blood thinners. What happens during the procedure?  Your health care provider will feel for trigger points. A marker may be used to circle the area for the injection.  The skin over the trigger point will be washed with a germ-killing (antiseptic) solution.  A thin needle is used for the shot. You may feel pain or a twitching feeling when the needle enters the trigger point.  A numbing solution may be injected into the trigger point. Sometimes a medicine to keep down swelling, redness, and warmth (inflammation) is also injected.  Your health care provider may move the needle around the area where the trigger point is located until the tightness and twitching goes away.  After the injection, your health care provider may put gentle pressure over the injection site.  The injection site will be covered with a bandage (dressing). The procedure may vary among health care providers and hospitals. What happens after the procedure?  The dressing can be taken off in a few hours or as told by your health care provider.  You may feel sore and stiff for 1-2 days. This information is not intended to replace advice given to you by your health care provider. Make sure you discuss any questions you have with your health care provider. Document Released: 01/20/2011 Document Revised: 10/04/2015 Document Reviewed: 07/21/2014 Elsevier Interactive Patient Education  2018 [place]. Pain Management Discharge Instructions  General Discharge  Instructions :  If you need to reach your doctor call: Monday-Friday 8:00 am - 4:00 pm at [phone] or toll free [phone].  After clinic hours [phone] to have operator reach doctor.  Bring all of your medication bottles to all your appointments in the pain clinic.  To cancel or reschedule your appointment with Pain Management please remember to call 24 hours in advance to avoid a fee.  Refer to the educational materials which you have been given on: General Risks, I had my Procedure. Discharge Instructions, Post Sedation.  Post Procedure Instructions:  The drugs you were given will stay in your system until tomorrow, so for the next 24 hours you should not drive, make any legal decisions or drink any alcoholic beverages.  You may eat anything you prefer, but it is better to start with liquids then soups and crackers, and gradually work up to solid foods.  Please notify your doctor immediately if you have any unusual bleeding, trouble breathing or pain that is not related to your normal pain.  Depending on the type of procedure that was done, some parts of your body may feel week and/or numb.  This usually clears up by tonight or the next day.  Walk with the use of an assistive device or accompanied by an adult for the 24 hours.  You may use ice on the affected area for the first 24 hours.  Put ice in a Ziploc  bag and cover with a towel and place against area 15 minutes on 15 minutes off.  You may switch to heat after 24 hours.

## 2016-12-27 NOTE — Progress Notes (Signed)
Results were reviewed and found to be: abnormal  Further testing may be useful, no acute fractures  Review would suggest interventional pain management techniques may be of benefit

## 2016-12-27 NOTE — Progress Notes (Signed)
Patient's Name: Grace [name]  MRN: [id number]  Referring Provider: [name], MD  DOB: 01/14/1951  PCP: [name], MD  DOS: 12/27/2016  Note by: Gaspar Cola, MD  Service setting: Ambulatory outpatient  Specialty: Interventional Pain Management  Patient type: Established  Location: [facility] (AMB) Pain Management Facility  Visit type: Interventional Procedure   Primary Reason for Visit: Interventional Pain Management Treatment. CC: Shoulder Pain (right) and Elbow Pain (right)  Procedure #1:  Anesthesia, Analgesia, Anxiolysis:  Type: Diagnostic Acromio-clavicular Joint Injection Region: Superior Shoulder Area Level: Shoulder Laterality: Right-Sided  Type: Local Anesthesia Local Anesthetic: Lidocaine 1% Route: Infiltration ([place]/IM) IV Access: Declined Sedation: Declined  Indication(s): Analgesia           Procedure #2:  Anesthesia, Analgesia, Anxiolysis:  Type: Diagnostic Medial epicondyle steroid Injection Region: Medial elbow joint area (medial epicondyle) Level: Elbow Laterality: Right-Sided  Type: Local Anesthesia Local Anesthetic: Lidocaine 1% Route: Infiltration ([place]/IM) IV Access: Declined Sedation: Declined  Indication(s): Analgesia           Indications: 1. Chronic shoulder pain (Bilateral) (R>L)   2. Chronic acromioclavicular pain (Right)   3. Osteoarthritis acromioclavicular joint (Right)   4. Osteoarthritis glenohumeral joint (Left)   5. Chronic elbow pain (Right)   6. Medial epicondylitis of elbow (Right)   7. Chronic pain of both shoulders   8. Pain in right acromioclavicular joint   9. Osteoarthritis of right AC (acromioclavicular) joint   10. Osteoarthritis of left glenohumeral joint   11. Chronic elbow pain, right   12. Medial epicondylitis of right elbow    Pain Score: Pre-procedure: 6 /10 Post-procedure: 0-No pain/10  Pre-op Assessment:  Grace [name] is a 66 y.o. (year old), female patient, seen today for interventional treatment. She  has  a past surgical history that includes Cardiac catheterization; Cesarean section; Appendectomy; Cataract extraction; Colonoscopy; Laminotomy; and Left Lumbar Four-Five Laminectomy and Foraminotomy (Left, 08/01/2016). Grace [name] has a current medication list which includes the following prescription(s): albuterol, alendronate, aripiprazole, diltiazem, duloxetine, ibuprofen, lorazepam, metoprolol succinate, modafinil, oxycodone-acetaminophen, and sacubitril-valsartan, and the following Facility-Administered Medications: ropivacaine (pf) 2 mg/ml (0.2%) and triamcinolone acetonide. Her primarily concern today is the Shoulder Pain (right) and Elbow Pain (right)  Initial Vital Signs: Blood pressure 117/82, pulse 85, temperature 97.7 F (36.5 C), temperature source Oral, resp. rate 16, height 5\' 5"  (1.651 m), weight 224 lb (101.6 kg), SpO2 95 %. BMI: Estimated body mass index is 37.28 kg/m as calculated from the following:   Height as of this encounter: 5\' 5"  (1.651 m).   Weight as of this encounter: 224 lb (101.6 kg).  Risk Assessment: Allergies: Reviewed. She is allergic to iodine.  Allergy Precautions: None required Coagulopathies: Reviewed. None identified.  Blood-thinner therapy: None at this time Active Infection(s): Reviewed. None identified. Grace [name] is afebrile  Site Confirmation: Grace [name] was asked to confirm the procedure and laterality before marking the site Procedure checklist: Completed Consent: Before the procedure and under the influence of no sedative(s), amnesic(s), or anxiolytics, the patient was informed of the treatment options, risks and possible complications. To fulfill our ethical and legal obligations, as recommended by the American Medical Association's Code of Ethics, I have informed the patient of my clinical impression; the nature and purpose of the treatment or procedure; the risks, benefits, and possible complications of the intervention; the alternatives, including  doing nothing; the risk(s) and benefit(s) of the alternative treatment(s) or procedure(s); and the risk(s) and benefit(s) of doing nothing. The patient was provided  information about the general risks and possible complications associated with the procedure. These may include, but are not limited to: failure to achieve desired goals, infection, bleeding, organ or nerve damage, allergic reactions, paralysis, and death. In addition, the patient was informed of those risks and complications associated to the procedure, such as failure to decrease pain; infection; bleeding; organ or nerve damage with subsequent damage to sensory, motor, and/or autonomic systems, resulting in permanent pain, numbness, and/or weakness of one or several areas of the body; allergic reactions; (i.e.: anaphylactic reaction); and/or death. Furthermore, the patient was informed of those risks and complications associated with the medications. These include, but are not limited to: allergic reactions (i.e.: anaphylactic or anaphylactoid reaction(s)); adrenal axis suppression; blood sugar elevation that in diabetics may result in ketoacidosis or comma; water retention that in patients with history of congestive heart failure may result in shortness of breath, pulmonary edema, and decompensation with resultant heart failure; weight gain; swelling or edema; medication-induced neural toxicity; particulate matter embolism and blood vessel occlusion with resultant organ, and/or nervous system infarction; and/or aseptic necrosis of one or more joints. Finally, the patient was informed that Medicine is not an exact science; therefore, there is also the possibility of unforeseen or unpredictable risks and/or possible complications that may result in a catastrophic outcome. The patient indicated having understood very clearly. We have given the patient no guarantees and we have made no promises. Enough time was given to the patient to ask questions,  all of which were answered to the patient's satisfaction. Grace [name] has indicated that she wanted to continue with the procedure. Attestation: I, the ordering provider, attest that I have discussed with the patient the benefits, risks, side-effects, alternatives, likelihood of achieving goals, and potential problems during recovery for the procedure that I have provided informed consent. Date: 12/27/2016; Time: 11:25 AM  Pre-Procedure Preparation:  Monitoring: As per clinic protocol. Respiration, ETCO2, SpO2, BP, heart rate and rhythm monitor placed and checked for adequate function Safety Precautions: Patient was assessed for positional comfort and pressure points before starting the procedure. Time-out: I initiated and conducted the "Time-out" before starting the procedure, as per protocol. The patient was asked to participate by confirming the accuracy of the "Time Out" information. Verification of the correct person, site, and procedure were performed and confirmed by me, the nursing staff, and the patient. "Time-out" conducted as per Joint Commission's Universal Protocol (UP.01.01.01). "Time-out" Date & Time: 12/27/2016; 1144 hrs.  Description of Procedure #1 Process:   Position: Supine Target Area: Acromio-clavicular Joint Approach: Anterior approach. Area Prepped: Entire shoulder Area Prepping solution: ChloraPrep (2% chlorhexidine gluconate and 70% isopropyl alcohol) Safety Precautions: Aspiration looking for blood return was conducted prior to all injections. At no point did we inject any substances, as a needle was being advanced. No attempts were made at seeking any paresthesias. Safe injection practices and needle disposal techniques used. Medications properly checked for expiration dates. SDV (single dose vial) medications used. Description of the Procedure: Protocol guidelines were followed. The patient was placed in position over the procedure table. The target area was identified  and the area prepped in the usual manner. Skin & deeper tissues infiltrated with local anesthetic. Appropriate amount of time allowed to pass for local anesthetics to take effect. The procedure needles were then advanced to the target area (right acromioclavicular joint). Proper needle placement secured. Negative aspiration confirmed. Solution injected in intermittent fashion, asking for systemic symptoms every 0.5cc of injectate. The needles were then removed  and the area cleansed, making sure to leave some of the prepping solution back to take advantage of its long term bactericidal properties. Vitals:   12/27/16 1029 12/27/16 1142 12/27/16 1147 12/27/16 1151  BP: 117/82 (!) 131/91 112/76 109/80  Pulse: 85     Resp: 16 20 (!) 25 (!) 24  Temp: 97.7 F (36.5 C)     TempSrc: Oral     SpO2: 95% 100% 99% 100%  Weight: 224 lb (101.6 kg)     Height: 5\' 5"  (1.651 m)       Start Time: 1144 hrs. Materials:  Needle(s) Type: Regular needle Gauge: 22G Length: 3.5-in Medication(s): We administered lidocaine, methylPREDNISolone acetate, and ropivacaine (PF) 2 mg/mL (0.2%). Please see chart orders for dosing details.  Description of Procedure #2 Process:   Position: Supine Target Area: Medial epicondyles Approach: Medial approach. Area Prepped: Entire elbow area Prepping solution: ChloraPrep (2% chlorhexidine gluconate and 70% isopropyl alcohol) Safety Precautions: Aspiration looking for blood return was conducted prior to all injections. At no point did we inject any substances, as a needle was being advanced. No attempts were made at seeking any paresthesias. Safe injection practices and needle disposal techniques used. Medications properly checked for expiration dates. SDV (single dose vial) medications used. Description of the Procedure: Protocol guidelines were followed. The patient was placed in position over the procedure table. The target area was identified and the area prepped in the usual  manner. Skin & deeper tissues infiltrated with local anesthetic. Appropriate amount of time allowed to pass for local anesthetics to take effect. The procedure needles were then advanced to the target area (medial epicondyle on right elbow). Proper needle placement secured. Negative aspiration confirmed. Solution injected in intermittent fashion, asking for systemic symptoms every 0.5cc of injectate. The needles were then removed and the area cleansed, making sure to leave some of the prepping solution back to take advantage of its long term bactericidal properties. Vitals:   12/27/16 1029 12/27/16 1142 12/27/16 1147 12/27/16 1151  BP: 117/82 (!) 131/91 112/76 109/80  Pulse: 85     Resp: 16 20 (!) 25 (!) 24  Temp: 97.7 F (36.5 C)     TempSrc: Oral     SpO2: 95% 100% 99% 100%  Weight: 224 lb (101.6 kg)     Height: 5\' 5"  (1.651 m)      End Time: 1150 hrs. Materials:  Needle(s) Type: Regular needle Gauge: 25G Length: 1.5-in Medication(s): We administered lidocaine, methylPREDNISolone acetate, and ropivacaine (PF) 2 mg/mL (0.2%). Please see chart orders for dosing details.  Imaging Guidance (Non-Spinal):  Type of Imaging Technique: Fluoroscopy Guidance (Non-Spinal) Indication(s): Assistance in needle guidance and placement for procedures requiring needle placement in or near specific anatomical locations not easily accessible without such assistance. Exposure Time: Please see nurses notes. Contrast: None used. Fluoroscopic Guidance: I was personally present during the use of fluoroscopy. "Tunnel Vision Technique" used to obtain the best possible view of the target area. Parallax error corrected before commencing the procedure. "Direction-depth-direction" technique used to introduce the needle under continuous pulsed fluoroscopy. Once target was reached, antero-posterior, oblique, and lateral fluoroscopic projection used confirm needle placement in all planes. Images permanently stored in  EMR. Interpretation: No contrast injected. I personally interpreted the imaging intraoperatively. Adequate needle placement confirmed in multiple planes. Permanent images saved into the patient's record.  Antibiotic Prophylaxis:  Indication(s): None identified Antibiotic given: None  Post-operative Assessment:  EBL: None Complications: No immediate post-treatment complications observed by team, or reported  by patient. Note: The patient tolerated the entire procedure well. A repeat set of vitals were taken after the procedure and the patient was kept under observation following institutional policy, for this type of procedure. Post-procedural neurological assessment was performed, showing return to baseline, prior to discharge. The patient was provided with post-procedure discharge instructions, including a section on how to identify potential problems. Should any problems arise concerning this procedure, the patient was given instructions to immediately contact us, at any time, without hesitation. In any case, we plan to contact the patient by telephone for a follow-up status report regarding this interventional procedure. Comments:  No additional relevant information.  Plan of Care   Imaging Orders     DG C-Arm 1-60 Min-No Report  Procedure Orders     SHOULDER INJECTION     Medium Joint Injection/Arthrocentesis     SHOULDER INJECTION  Medications ordered for procedure: Meds ordered this encounter  Medications  . lidocaine (XYLOCAINE) 2 % (with pres) injection 400 mg  . ropivacaine (PF) 2 mg/mL (0.2%) (NAROPIN) injection 4 mL  . methylPREDNISolone acetate (DEPO-MEDROL) injection 40 mg  . triamcinolone acetonide (KENALOG-40) injection 40 mg  . ropivacaine (PF) 2 mg/mL (0.2%) (NAROPIN) injection 4 mL   Medications administered: We administered lidocaine, methylPREDNISolone acetate, and ropivacaine (PF) 2 mg/mL (0.2%).  See the medical record for exact dosing, route, and time of  administration.  This SmartLink is deprecated. Use AVSMEDLIST instead to display the medication list for a patient. Disposition: Discharge home  Discharge Date & Time: 12/27/2016; 1200 hrs.   Physician-requested Follow-up: Return for Procedure (no sedation): (L) Shoulder. Future Appointments  Date Time Provider [place]  01/10/2017  9:00 AM Milinda Pointer, MD [facility] None   Primary Care Physician: [name], MD Location: [facility] Outpatient Pain Management Facility Note by: Gaspar Cola, MD Date: 12/27/2016; Time: 4:38 PM  Disclaimer:  Medicine is not an exact science. The only guarantee in medicine is that nothing is guaranteed. It is important to note that the decision to proceed with this intervention was based on the information collected from the patient. The Data and conclusions were drawn from the patient's questionnaire, the interview, and the physical examination. Because the information was provided in large part by the patient, it cannot be guaranteed that it has not been purposely or unconsciously manipulated. Every effort has been made to obtain as much relevant data as possible for this evaluation. It is important to note that the conclusions that lead to this procedure are derived in large part from the available data. Always take into account that the treatment will also be dependent on availability of resources and existing treatment guidelines, considered by other Pain Management Practitioners as being common knowledge and practice, at the time of the intervention. For Medico-Legal purposes, it is also important to point out that variation in procedural techniques and pharmacological choices are the acceptable norm. The indications, contraindications, technique, and results of the above procedure should only be interpreted and judged by a Board-Certified Interventional Pain Specialist with extensive familiarity and expertise in the same exact procedure and  technique.

## 2016-12-28 ENCOUNTER — Telehealth: Payer: Self-pay | Admitting: *Deleted

## 2016-12-28 NOTE — Telephone Encounter (Signed)
No problems post procedure. 

## 2017-01-03 DIAGNOSIS — R8290 Unspecified abnormal findings in urine: Secondary | ICD-10-CM | POA: Diagnosis not present

## 2017-01-03 DIAGNOSIS — R110 Nausea: Secondary | ICD-10-CM | POA: Diagnosis not present

## 2017-01-03 DIAGNOSIS — R350 Frequency of micturition: Secondary | ICD-10-CM | POA: Diagnosis not present

## 2017-01-03 DIAGNOSIS — Z23 Encounter for immunization: Secondary | ICD-10-CM | POA: Diagnosis not present

## 2017-01-09 ENCOUNTER — Other Ambulatory Visit: Payer: Self-pay | Admitting: Student

## 2017-01-09 DIAGNOSIS — R748 Abnormal levels of other serum enzymes: Secondary | ICD-10-CM

## 2017-01-09 DIAGNOSIS — R110 Nausea: Secondary | ICD-10-CM

## 2017-01-10 ENCOUNTER — Ambulatory Visit: Payer: Medicare Other | Admitting: Pain Medicine

## 2017-01-10 ENCOUNTER — Ambulatory Visit
Admission: RE | Admit: 2017-01-10 | Discharge: 2017-01-10 | Disposition: A | Discharge status: Home / Self Care | Payer: Medicare Other | Source: Ambulatory Visit | Attending: Student | Admitting: Student

## 2017-01-10 DIAGNOSIS — R110 Nausea: Secondary | ICD-10-CM

## 2017-01-10 DIAGNOSIS — R160 Hepatomegaly, not elsewhere classified: Secondary | ICD-10-CM | POA: Insufficient documentation

## 2017-01-10 DIAGNOSIS — R748 Abnormal levels of other serum enzymes: Secondary | ICD-10-CM | POA: Diagnosis not present

## 2017-01-10 DIAGNOSIS — R7989 Other specified abnormal findings of blood chemistry: Secondary | ICD-10-CM | POA: Diagnosis not present

## 2017-01-10 NOTE — Progress Notes (Signed)
[place]  [phone] [phone]) [phone]  ID: Grace [name] OB: Sep 23, 1950  MR#: [id number]  XBD#:[id number]  Patient Care Team: [name], MD as PCP - General (Family Medicine)  CHIEF COMPLAINT: Multiple liver masses.  INTERVAL HISTORY: Patient is a 66 year old female who had an abdominal ultrasound to assess mildly elevated liver enzymes.  Imaging revealed multiple lesions in her liver concerning for metastatic disease.  She has chronic pain and is seen in the pain clinic on a regular basis, but otherwise feels well.  She has no neurologic complaints.  She denies any recent fevers or illnesses.  She has a good appetite and denies weight loss.  She has no chest pain, cough, hemoptysis, or shortness of breath.  She denies any nausea, vomiting, constipation, or diarrhea.  She has no abdominal pain.  She denies any melena or hematochezia.  She has no urinary complaints.  Patient offers no specific complaints today.  REVIEW OF SYSTEMS:   Review of Systems  Constitutional: Negative.  Negative for fever, malaise/fatigue and weight loss.  Respiratory: Negative.  Negative for cough, hemoptysis and shortness of breath.   Cardiovascular: Negative.  Negative for chest pain and leg swelling.  Gastrointestinal: Negative.  Negative for abdominal pain, blood in stool and melena.  Genitourinary: Negative.   Musculoskeletal: Positive for back pain, joint pain and neck pain.  Skin: Negative.  Negative for rash.  Neurological: Negative.  Negative for sensory change and weakness.  Psychiatric/Behavioral: Negative.  The patient is not nervous/anxious.     As per HPI. Otherwise, a complete review of systems is negative.  PAST MEDICAL HISTORY: Past Medical History:  Diagnosis Date  . Anxiety   . Arthritis   . Asthma   . CAP (community acquired pneumonia) 06/30/2014  . Cardiomyopathy ([place])    nonischemic (EF 35-40%)  . COPD (chronic obstructive pulmonary disease)  ([place])   . Depression   . Dyspnea   . GERD (gastroesophageal reflux disease)   . Hyperlipidemia   . Hypertension   . Pneumonia   . PVC's (premature ventricular contractions)    states 10 years ago  . Sepsis ([place]) 06/30/2014  . SOB (shortness of breath) 05/08/2014  . Spinal cord injury at T7-T12 level [facility])    2016    PAST SURGICAL HISTORY: Past Surgical History:  Procedure Laterality Date  . APPENDECTOMY    . CARDIAC CATHETERIZATION    . CATARACT EXTRACTION     right eye   . CESAREAN SECTION    . COLONOSCOPY    . LAMINOTOMY    . LUMBAR LAMINECTOMY/DECOMPRESSION MICRODISCECTOMY Left 08/01/2016   Procedure: Left Lumbar Four-Five Laminectomy and Foraminotomy;  Surgeon: Earnie Larsson, MD;  Location: [place];  Service: Neurosurgery;  Laterality: Left;    FAMILY HISTORY: Family History  Problem Relation Age of Onset  . Heart attack Father 68       MI x 3     ADVANCED DIRECTIVES (Y/N):  N  HEALTH MAINTENANCE: Social History   Tobacco Use  . Smoking status: Former Smoker    Packs/day: 0.50    Years: 50.00    Pack years: 25.00    Types: Cigarettes  . Smokeless tobacco: Never Used  Substance Use Topics  . Alcohol use: No  . Drug use: No     Colonoscopy:  PAP:  Bone density:  Lipid panel:  Allergies  Allergen Reactions  . Iodine Anaphylaxis    IVP dye    Current Outpatient Medications  Medication Sig  Dispense Refill  . albuterol (PROAIR HFA) 108 (90 Base) MCG/ACT inhaler Inhale into the lungs.    . ARIPiprazole (ABILIFY) 5 MG tablet Take 5 mg by mouth daily.    . DULoxetine (CYMBALTA) 60 MG capsule Take 60 mg by mouth daily.    . metoprolol succinate (TOPROL-XL) 100 MG 24 hr tablet TAKE ONE TABLET BY MOUTH ONCE DAILY 30 tablet 0  . modafinil (PROVIGIL) 200 MG tablet Take 200 mg by mouth daily as needed (for alertness 3rd shift).     Marland Kitchen oxyCODONE-acetaminophen (PERCOCET/ROXICET) 5-325 MG tablet Take 1 tablet by mouth every 6 (six) hours as needed. For pain.  0  .  sacubitril-valsartan (ENTRESTO) 49-51 MG Take 1 tablet by mouth 2 (two) times daily. 60 tablet 6  . alendronate (FOSAMAX) 70 MG tablet Take 70 mg by mouth every Monday.    . diltiazem (CARDIZEM) 30 MG tablet Take 1 tablet (30 mg total) by mouth 3 (three) times daily as needed. (Patient not taking: Reported on 01/13/2017) 90 tablet 3  . ibuprofen (ADVIL,MOTRIN) 200 MG tablet Take 800 mg by mouth every 8 (eight) hours as needed (for pain.).    Marland Kitchen LORazepam (ATIVAN) 0.5 MG tablet Take 0.5 mg by mouth daily as needed for anxiety.      No current facility-administered medications for this visit.     OBJECTIVE: Vitals:   01/13/17 1423  BP: 102/69  Pulse: (!) 120  Resp: 18  Temp: 97.9 F (36.6 C)     There is no height or weight on file to calculate BMI.    ECOG FS:1 - Symptomatic but completely ambulatory  General: Well-developed, well-nourished, no acute distress. Eyes: Pink conjunctiva, anicteric sclera. HEENT: Normocephalic, moist mucous membranes, clear oropharnyx. Lungs: Clear to auscultation bilaterally. Heart: Regular rate and rhythm. No rubs, murmurs, or gallops. Abdomen: Soft, nontender, nondistended. No organomegaly noted, normoactive bowel sounds. Musculoskeletal: No edema, cyanosis, or clubbing. Neuro: Alert, answering all questions appropriately. Cranial nerves grossly intact. Skin: No rashes or petechiae noted. Psych: Normal affect. Lymphatics: No cervical, calvicular, axillary or inguinal LAD.   LAB RESULTS:  Lab Results  Component Value Date   NA 139 01/13/2017   K 4.5 01/13/2017   CL 109 01/13/2017   CO2 19 (L) 01/13/2017   GLUCOSE 104 (H) 01/13/2017   BUN 26 (H) 01/13/2017   CREATININE 1.18 (H) 01/13/2017   CALCIUM 8.5 (L) 01/13/2017   PROT 8.1 01/13/2017   ALBUMIN 3.8 01/13/2017   AST 32 01/13/2017   ALT 36 01/13/2017   ALKPHOS 76 01/13/2017   BILITOT 0.5 01/13/2017   GFRNONAA 47 (L) 01/13/2017   GFRAA 55 (L) 01/13/2017    Lab Results  Component  Value Date   WBC 10.6 01/13/2017   NEUTROABS 8.1 (H) 01/13/2017   HGB 12.7 01/13/2017   HCT 38.5 01/13/2017   MCV 91.1 01/13/2017   PLT 251 01/13/2017     STUDIES: Dg Shoulder Right  Result Date: 12/26/2016 CLINICAL DATA:  66 year old female with chronic shoulder pain bilaterally. No injury. Initial encounter. EXAM: RIGHT SHOULDER - 2+ VIEW COMPARISON:  None. FINDINGS: Mild acromioclavicular joint degenerative changes. No fracture or dislocation. No abnormal soft tissue calcifications. Visualized lungs clear. IMPRESSION: Mild acromioclavicular joint degenerative changes. Electronically Signed   By: Genia Del M.D.   On: 12/26/2016 18:40   Dg Shoulder Left  Result Date: 12/26/2016 CLINICAL DATA:  66 year old female with chronic shoulder pain bilaterally. No injury. Initial encounter. EXAM: LEFT SHOULDER - 2+ VIEW COMPARISON:  None.  FINDINGS: Prominent left glenohumeral joint degenerative changes. Question remote left humeral head fracture. No findings of acute fracture or dislocation. Mild acromioclavicular joint degenerative changes. No abnormal soft tissue calcifications. Visualized lungs clear. IMPRESSION: Prominent left glenohumeral joint degenerative changes. Question remote left humeral head fracture. No findings of acute fracture or dislocation. Mild acromioclavicular joint degenerative changes. Electronically Signed   By: Genia Del M.D.   On: 12/26/2016 18:42   Dg C-arm 1-60 Min-no Report  Result Date: 12/27/2016 Fluoroscopy was utilized by the requesting physician.  No radiographic interpretation.   US Abdomen Limited Ruq  Result Date: 01/10/2017 CLINICAL DATA:  Elevated LFTs with nausea for 3 weeks EXAM: ULTRASOUND ABDOMEN LIMITED RIGHT UPPER QUADRANT COMPARISON:  CT abdomen and pelvis 02/21/2014 FINDINGS: Gallbladder: Normally distended without stones or wall thickening. No pericholecystic fluid or sonographic Murphy sign. Common bile duct: Diameter: Normal caliber 5 mm  diameter Liver: Parenchymal echogenicity normal. Multiple hepatic masses are identified. A LEFT lobe lesion measures 3.5 x 3.5 x 3.8 cm. Largest lesion in RIGHT lobe measures 6.2 x 5.9 x 6.5 cm. These lesions were not visualized on a prior noncontrast CT from 2016. Findings are highly suspicious for metastatic disease. Portal vein is patent on color Doppler imaging with normal direction of blood flow towards the liver. No RIGHT upper quadrant free fluid. IMPRESSION: Multiple soft tissue masses within the liver up to 6.5 cm in size highly concerning for hepatic metastases. Findings called to Wayland Denis PA on 01/10/2017 at 1045 hrs. Electronically Signed   By: Lavonia Dana M.D.   On: 01/10/2017 10:47    ASSESSMENT: Multiple liver masses.  PLAN:  1. Multiple liver masses: Highly concerning for metastatic disease.  AFP, CA-19-9, and CEA are all within normal limits.  CA-27-29 is only mildly elevated at 86.2.  Will order CT scan of the chest, abdomen, pelvis for further evaluation and to assess for a primary lesion.  Patient may also require colonoscopy or endoscopy if necessary.  Will get an ultrasound-guided liver biopsy to confirm the diagnosis as well.  Patient will return to clinic 2-3 days after her biopsy to discuss the results and treatment planning. 2.  Liver enzymes: Resolved.  Approximately 60 minutes was spent in discussion of which greater than 50% was consultation.  Patient expressed understanding and was in agreement with this plan. She also understands that She can call clinic at any time with any questions, concerns, or complaints.   Cancer Staging No matching staging information was found for the patient.  Lloyd Huger, MD   01/15/2017 9:47 AM

## 2017-01-12 ENCOUNTER — Ambulatory Visit: Payer: Medicare Other | Admitting: Pain Medicine

## 2017-01-13 ENCOUNTER — Inpatient Hospital Stay: Payer: Medicare Other | Attending: Oncology | Admitting: Oncology

## 2017-01-13 ENCOUNTER — Other Ambulatory Visit: Payer: Self-pay

## 2017-01-13 ENCOUNTER — Inpatient Hospital Stay: Payer: Medicare Other

## 2017-01-13 VITALS — BP 102/69 | HR 120 | Temp 97.9°F | Resp 18

## 2017-01-13 DIAGNOSIS — Z79899 Other long term (current) drug therapy: Secondary | ICD-10-CM | POA: Insufficient documentation

## 2017-01-13 DIAGNOSIS — Z8619 Personal history of other infectious and parasitic diseases: Secondary | ICD-10-CM

## 2017-01-13 DIAGNOSIS — R0602 Shortness of breath: Secondary | ICD-10-CM | POA: Diagnosis not present

## 2017-01-13 DIAGNOSIS — F419 Anxiety disorder, unspecified: Secondary | ICD-10-CM

## 2017-01-13 DIAGNOSIS — E785 Hyperlipidemia, unspecified: Secondary | ICD-10-CM | POA: Insufficient documentation

## 2017-01-13 DIAGNOSIS — Z8701 Personal history of pneumonia (recurrent): Secondary | ICD-10-CM

## 2017-01-13 DIAGNOSIS — J45909 Unspecified asthma, uncomplicated: Secondary | ICD-10-CM | POA: Insufficient documentation

## 2017-01-13 DIAGNOSIS — K219 Gastro-esophageal reflux disease without esophagitis: Secondary | ICD-10-CM

## 2017-01-13 DIAGNOSIS — K769 Liver disease, unspecified: Secondary | ICD-10-CM | POA: Diagnosis not present

## 2017-01-13 DIAGNOSIS — R948 Abnormal results of function studies of other organs and systems: Secondary | ICD-10-CM | POA: Diagnosis not present

## 2017-01-13 DIAGNOSIS — F329 Major depressive disorder, single episode, unspecified: Secondary | ICD-10-CM | POA: Insufficient documentation

## 2017-01-13 DIAGNOSIS — I509 Heart failure, unspecified: Secondary | ICD-10-CM | POA: Diagnosis not present

## 2017-01-13 DIAGNOSIS — G8929 Other chronic pain: Secondary | ICD-10-CM | POA: Diagnosis not present

## 2017-01-13 DIAGNOSIS — J449 Chronic obstructive pulmonary disease, unspecified: Secondary | ICD-10-CM | POA: Diagnosis not present

## 2017-01-13 DIAGNOSIS — Z87891 Personal history of nicotine dependence: Secondary | ICD-10-CM | POA: Diagnosis not present

## 2017-01-13 DIAGNOSIS — I10 Essential (primary) hypertension: Secondary | ICD-10-CM | POA: Diagnosis not present

## 2017-01-13 DIAGNOSIS — S24104S Unspecified injury at T11-T12 level of thoracic spinal cord, sequela: Secondary | ICD-10-CM

## 2017-01-13 DIAGNOSIS — R160 Hepatomegaly, not elsewhere classified: Secondary | ICD-10-CM

## 2017-01-13 DIAGNOSIS — R110 Nausea: Secondary | ICD-10-CM

## 2017-01-13 LAB — CBC WITH DIFFERENTIAL/PLATELET
BASOS ABS: 0.1 10*3/uL (ref 0–0.1)
Basophils Relative: 1 %
Eosinophils Absolute: 0.1 10*3/uL (ref 0–0.7)
Eosinophils Relative: 1 %
HEMATOCRIT: 38.5 % (ref 35.0–47.0)
HEMOGLOBIN: 12.7 g/dL (ref 12.0–16.0)
LYMPHS PCT: 15 %
Lymphs Abs: 1.6 10*3/uL (ref 1.0–3.6)
MCH: 30 pg (ref 26.0–34.0)
MCHC: 33 g/dL (ref 32.0–36.0)
MCV: 91.1 fL (ref 80.0–100.0)
Monocytes Absolute: 0.7 10*3/uL (ref 0.2–0.9)
Monocytes Relative: 7 %
NEUTROS ABS: 8.1 10*3/uL — AB (ref 1.4–6.5)
NEUTROS PCT: 76 %
PLATELETS: 251 10*3/uL (ref 150–440)
RBC: 4.23 MIL/uL (ref 3.80–5.20)
RDW: 14.1 % (ref 11.5–14.5)
WBC: 10.6 10*3/uL (ref 3.6–11.0)

## 2017-01-13 LAB — COMPREHENSIVE METABOLIC PANEL
ALBUMIN: 3.8 g/dL (ref 3.5–5.0)
ALT: 36 U/L (ref 14–54)
ANION GAP: 11 (ref 5–15)
AST: 32 U/L (ref 15–41)
Alkaline Phosphatase: 76 U/L (ref 38–126)
BILIRUBIN TOTAL: 0.5 mg/dL (ref 0.3–1.2)
BUN: 26 mg/dL — ABNORMAL HIGH (ref 6–20)
CHLORIDE: 109 mmol/L (ref 101–111)
CO2: 19 mmol/L — ABNORMAL LOW (ref 22–32)
Calcium: 8.5 mg/dL — ABNORMAL LOW (ref 8.9–10.3)
Creatinine, Ser: 1.18 mg/dL — ABNORMAL HIGH (ref 0.44–1.00)
GFR calc Af Amer: 55 mL/min — ABNORMAL LOW (ref >60)
GFR, EST NON AFRICAN AMERICAN: 47 mL/min — AB (ref >60)
Glucose, Bld: 104 mg/dL — ABNORMAL HIGH (ref 65–99)
POTASSIUM: 4.5 mmol/L (ref 3.5–5.1)
Sodium: 139 mmol/L (ref 135–145)
TOTAL PROTEIN: 8.1 g/dL (ref 6.5–8.1)

## 2017-01-13 LAB — PROTIME-INR
INR: 0.97
PROTHROMBIN TIME: 12.8 s (ref 11.4–15.2)

## 2017-01-13 LAB — APTT: APTT: 26 s (ref 24–36)

## 2017-01-13 NOTE — Progress Notes (Signed)
  Oncology Nurse Navigator Documentation Attended consult with Dr. Grayland Ormond. Introduced Therapist, nutritional. Provided my contact information for any future needs. Will send referral to PSN as she will likely have transportation issues. Navigator Location: [facility] (01/13/17 1500)   )Navigator Encounter Type: Initial MedOnc (01/13/17 1500)   Abnormal Finding Date: 01/10/17 (01/13/17 1500)                 Patient Visit Type: MedOnc;Initial (01/13/17 1500) Treatment Phase: Abnormal Scans (01/13/17 1500) Barriers/Navigation Needs: Transportation;Coordination of Care (01/13/17 1500)   Interventions: Transportation;Referrals;Coordination of Care;Education (01/13/17 1500) Referrals: Social Work (01/13/17 1500)                    Time Spent with Patient: 30 (01/13/17 1500)

## 2017-01-13 NOTE — Progress Notes (Signed)
Patient here today for initial evaluation regarding liver mass.

## 2017-01-13 NOTE — Patient Instructions (Signed)
Your nurse navigator is Mariea Clonts. [phone].

## 2017-01-14 LAB — CANCER ANTIGEN 19-9: CA 19-9: 27 U/mL (ref 0–35)

## 2017-01-14 LAB — AFP TUMOR MARKER: AFP, SERUM, TUMOR MARKER: 4.2 ng/mL (ref 0.0–8.3)

## 2017-01-14 LAB — CEA: CEA: 2.6 ng/mL (ref 0.0–4.7)

## 2017-01-14 LAB — CANCER ANTIGEN 27.29: CA 27.29: 86.2 U/mL — ABNORMAL HIGH (ref 0.0–38.6)

## 2017-01-17 ENCOUNTER — Telehealth: Payer: Self-pay

## 2017-01-17 ENCOUNTER — Ambulatory Visit (HOSPITAL_BASED_OUTPATIENT_CLINIC_OR_DEPARTMENT_OTHER): Payer: Medicare Other | Admitting: Pain Medicine

## 2017-01-17 ENCOUNTER — Other Ambulatory Visit: Payer: Self-pay

## 2017-01-17 ENCOUNTER — Ambulatory Visit
Admission: RE | Admit: 2017-01-17 | Discharge: 2017-01-17 | Disposition: A | Discharge status: Home / Self Care | Payer: Medicare Other | Source: Ambulatory Visit | Attending: Pain Medicine | Admitting: Pain Medicine

## 2017-01-17 ENCOUNTER — Encounter: Payer: Self-pay | Admitting: Pain Medicine

## 2017-01-17 VITALS — BP 125/88 | HR 110 | Temp 98.0°F | Resp 19 | Ht 65.0 in | Wt 212.0 lb

## 2017-01-17 DIAGNOSIS — G8929 Other chronic pain: Secondary | ICD-10-CM | POA: Insufficient documentation

## 2017-01-17 DIAGNOSIS — Z79891 Long term (current) use of opiate analgesic: Secondary | ICD-10-CM | POA: Insufficient documentation

## 2017-01-17 DIAGNOSIS — Z7983 Long term (current) use of bisphosphonates: Secondary | ICD-10-CM | POA: Diagnosis not present

## 2017-01-17 DIAGNOSIS — R188 Other ascites: Secondary | ICD-10-CM | POA: Insufficient documentation

## 2017-01-17 DIAGNOSIS — M19012 Primary osteoarthritis, left shoulder: Secondary | ICD-10-CM | POA: Diagnosis not present

## 2017-01-17 DIAGNOSIS — M25511 Pain in right shoulder: Secondary | ICD-10-CM

## 2017-01-17 DIAGNOSIS — C228 Malignant neoplasm of liver, primary, unspecified as to type: Secondary | ICD-10-CM | POA: Insufficient documentation

## 2017-01-17 DIAGNOSIS — Z79899 Other long term (current) drug therapy: Secondary | ICD-10-CM | POA: Insufficient documentation

## 2017-01-17 DIAGNOSIS — M25512 Pain in left shoulder: Secondary | ICD-10-CM | POA: Diagnosis not present

## 2017-01-17 MED ORDER — ROPIVACAINE HCL 2 MG/ML IJ SOLN
4.0000 mL | Freq: Once | INTRAMUSCULAR | Status: AC
  Administered 2017-01-17: 4 mL
  Filled 2017-01-17: qty 10

## 2017-01-17 MED ORDER — METHYLPREDNISOLONE ACETATE 80 MG/ML IJ SUSP
80.0000 mg | Freq: Once | INTRAMUSCULAR | Status: AC
  Administered 2017-01-17: 80 mg
  Filled 2017-01-17: qty 1

## 2017-01-17 MED ORDER — LIDOCAINE HCL 2 % IJ SOLN
20.0000 mL | Freq: Once | INTRAMUSCULAR | Status: AC
  Administered 2017-01-17: 400 mg
  Filled 2017-01-17: qty 40

## 2017-01-17 NOTE — Progress Notes (Signed)
Patient's Name: Grace [name]  MRN: [id number]  Referring Provider: [name], MD  DOB: January 03, 1951  PCP: [name], MD  DOS: 01/17/2017  Note by: Gaspar Cola, MD  Service setting: Ambulatory outpatient  Specialty: Interventional Pain Management  Patient type: Established  Location: [facility] (AMB) Pain Management Facility  Visit type: Interventional Procedure   Primary Reason for Visit: Interventional Pain Management Treatment. CC: Shoulder Pain (right)  Procedure:  Anesthesia, Analgesia, Anxiolysis:  Type: Diagnostic Suprascapular nerve Block Region: Posterior Shoulder & Scapular Areas Level: Superior to the scapular spine, in the lateral aspect of the supraspinatus fossa (Suprescapular notch). Laterality: Right-Side  Type: Local Anesthesia Local Anesthetic: Lidocaine 1% Route: Infiltration ([place]/IM) IV Access: Declined Sedation: Declined  Indication(s): Analgesia           Indications: 1. Chronic shoulder pain (Bilateral) (R>L)   2. Osteoarthritis glenohumeral joint (Left)   3. Chronic pain of both shoulders   4. Osteoarthritis of left glenohumeral joint    Pain Score: Pre-procedure: 6 /10 Post-procedure: 6 /10  Pre-op Assessment:  Grace [name] is a 66 y.o. (year old), female patient, seen today for interventional treatment. She  has a past surgical history that includes Cardiac catheterization; Cesarean section; Appendectomy; Cataract extraction; Colonoscopy; Lumbar laminectomy/decompression microdiscectomy (Left, 08/01/2016); and Laminotomy. Grace [name] has a current medication list which includes the following prescription(s): albuterol, alendronate, aripiprazole, duloxetine, ibuprofen, lorazepam, metoprolol succinate, modafinil, oxycodone-acetaminophen, promethazine, sacubitril-valsartan, and diltiazem. Her primarily concern today is the Shoulder Pain (right)  Unfortunately, since the patient's last visit to our clinic she was diagnosed with liver cancer.  She has  ascites and has difficulty with laying flat on her back or her stomach.  Today we will do the procedure left lateral decubiti.  Initial Vital Signs: Blood pressure 130/72, pulse (!) 110, temperature 98 F (36.7 C), height 5\' 5"  (1.651 m), weight 212 lb (96.2 kg), SpO2 95 %. BMI: Estimated body mass index is 35.28 kg/m as calculated from the following:   Height as of this encounter: 5\' 5"  (1.651 m).   Weight as of this encounter: 212 lb (96.2 kg).  Risk Assessment: Allergies: Reviewed. She is allergic to iodine.  Allergy Precautions: None required Coagulopathies: Reviewed. None identified.  Blood-thinner therapy: None at this time Active Infection(s): Reviewed. None identified. Grace [name] is afebrile  Site Confirmation: Grace [name] was asked to confirm the procedure and laterality before marking the site Procedure checklist: Completed Consent: Before the procedure and under the influence of no sedative(s), amnesic(s), or anxiolytics, the patient was informed of the treatment options, risks and possible complications. To fulfill our ethical and legal obligations, as recommended by the American Medical Association's Code of Ethics, I have informed the patient of my clinical impression; the nature and purpose of the treatment or procedure; the risks, benefits, and possible complications of the intervention; the alternatives, including doing nothing; the risk(s) and benefit(s) of the alternative treatment(s) or procedure(s); and the risk(s) and benefit(s) of doing nothing. The patient was provided information about the general risks and possible complications associated with the procedure. These may include, but are not limited to: failure to achieve desired goals, infection, bleeding, organ or nerve damage, allergic reactions, paralysis, and death. In addition, the patient was informed of those risks and complications associated to the procedure, such as failure to decrease pain; infection;  bleeding; organ or nerve damage with subsequent damage to sensory, motor, and/or autonomic systems, resulting in permanent pain, numbness, and/or weakness of one or several areas of  the body; allergic reactions; (i.e.: anaphylactic reaction); and/or death. Furthermore, the patient was informed of those risks and complications associated with the medications. These include, but are not limited to: allergic reactions (i.e.: anaphylactic or anaphylactoid reaction(s)); adrenal axis suppression; blood sugar elevation that in diabetics may result in ketoacidosis or comma; water retention that in patients with history of congestive heart failure may result in shortness of breath, pulmonary edema, and decompensation with resultant heart failure; weight gain; swelling or edema; medication-induced neural toxicity; particulate matter embolism and blood vessel occlusion with resultant organ, and/or nervous system infarction; and/or aseptic necrosis of one or more joints. Finally, the patient was informed that Medicine is not an exact science; therefore, there is also the possibility of unforeseen or unpredictable risks and/or possible complications that may result in a catastrophic outcome. The patient indicated having understood very clearly. We have given the patient no guarantees and we have made no promises. Enough time was given to the patient to ask questions, all of which were answered to the patient's satisfaction. Grace [name] has indicated that she wanted to continue with the procedure. Attestation: I, the ordering provider, attest that I have discussed with the patient the benefits, risks, side-effects, alternatives, likelihood of achieving goals, and potential problems during recovery for the procedure that I have provided informed consent. Date: 01/17/2017; Time: 3:00 PM  Pre-Procedure Preparation:  Monitoring: As per clinic protocol. Respiration, ETCO2, SpO2, BP, heart rate and rhythm monitor placed and  checked for adequate function Safety Precautions: Patient was assessed for positional comfort and pressure points before starting the procedure. Time-out: I initiated and conducted the "Time-out" before starting the procedure, as per protocol. The patient was asked to participate by confirming the accuracy of the "Time Out" information. Verification of the correct person, site, and procedure were performed and confirmed by me, the nursing staff, and the patient. "Time-out" conducted as per Joint Commission's Universal Protocol (UP.01.01.01). "Time-out" Date & Time: 01/17/2017; 1536 hrs.  Description of Procedure Process:   Position: Left Lateral Decubitus Target Area: Suprascapular nerve as it passes thru the lower portion of the suprascapular notch. Approach: Posterior approach. Area Prepped: Entire shoulder Area Prepping solution: ChloraPrep (2% chlorhexidine gluconate and 70% isopropyl alcohol) Safety Precautions: Aspiration looking for blood return was conducted prior to all injections. At no point did we inject any substances, as a needle was being advanced. No attempts were made at seeking any paresthesias. Safe injection practices and needle disposal techniques used. Medications properly checked for expiration dates. SDV (single dose vial) medications used. Description of the Procedure: Protocol guidelines were followed. The patient was placed in position over the procedure table. The target area was identified and the area prepped in the usual manner. Skin & deeper tissues infiltrated with local anesthetic. Appropriate amount of time allowed to pass for local anesthetics to take effect. The procedure needles were then advanced to the target area. Proper needle placement secured. Negative aspiration confirmed. Solution injected in intermittent fashion, asking for systemic symptoms every 0.5cc of injectate. The needles were then removed and the area cleansed, making sure to leave some of the  prepping solution back to take advantage of its long term bactericidal properties. Vitals:   01/17/17 1536 01/17/17 1540 01/17/17 1545 01/17/17 1550  BP: (!) 152/90 (!) 142/91 131/84 125/88  Pulse:      Resp: (!) 24 20 (!) 23 19  Temp:      SpO2: 98% 95% 95% 95%  Weight:      Height:  Start Time: 1537 hrs. End Time: 1550 hrs. Materials:  Needle(s) Type: Regular needle Gauge: 22G Length: 3.5-in Medication(s): We administered lidocaine, methylPREDNISolone acetate, and ropivacaine (PF) 2 mg/mL (0.2%). Please see chart orders for dosing details.  Imaging Guidance (Non-Spinal):  Type of Imaging Technique: Fluoroscopy Guidance (Non-Spinal) Indication(s): Assistance in needle guidance and placement for procedures requiring needle placement in or near specific anatomical locations not easily accessible without such assistance. Exposure Time: Please see nurses notes. Contrast: Before injecting any contrast, we confirmed that the patient did not have an allergy to iodine, shellfish, or radiological contrast. Once satisfactory needle placement was completed at the desired level, radiological contrast was injected. Contrast injected under live fluoroscopy. No contrast complications. See chart for type and volume of contrast used. Fluoroscopic Guidance: I was personally present during the use of fluoroscopy. "Tunnel Vision Technique" used to obtain the best possible view of the target area. Parallax error corrected before commencing the procedure. "Direction-depth-direction" technique used to introduce the needle under continuous pulsed fluoroscopy. Once target was reached, antero-posterior, oblique, and lateral fluoroscopic projection used confirm needle placement in all planes. Images permanently stored in EMR. Interpretation: I personally interpreted the imaging intraoperatively. Adequate needle placement confirmed in multiple planes. Appropriate spread of contrast into desired area was  observed. No evidence of afferent or efferent intravascular uptake. Permanent images saved into the patient's record.  Antibiotic Prophylaxis:  Indication(s): None identified Antibiotic given: None  Post-operative Assessment:  EBL: None Complications: No immediate post-treatment complications observed by team, or reported by patient. Note: The patient tolerated the entire procedure well. A repeat set of vitals were taken after the procedure and the patient was kept under observation following institutional policy, for this type of procedure. Post-procedural neurological assessment was performed, showing return to baseline, prior to discharge. The patient was provided with post-procedure discharge instructions, including a section on how to identify potential problems. Should any problems arise concerning this procedure, the patient was given instructions to immediately contact us, at any time, without hesitation. In any case, we plan to contact the patient by telephone for a follow-up status report regarding this interventional procedure. Comments:  No additional relevant information.  Plan of Care   Possible POC:  If the patient gets good relief of the pain with this diagnostic procedure, we will consider radiofrequency of the right suprascapular nerve.   Imaging Orders     DG C-Arm 1-60 Min-No Report  Procedure Orders     SUPRASCAPULAR NERVE BLOCK  Medications ordered for procedure: Meds ordered this encounter  Medications  . lidocaine (XYLOCAINE) 2 % (with pres) injection 400 mg  . methylPREDNISolone acetate (DEPO-MEDROL) injection 80 mg  . ropivacaine (PF) 2 mg/mL (0.2%) (NAROPIN) injection 4 mL   Medications administered: We administered lidocaine, methylPREDNISolone acetate, and ropivacaine (PF) 2 mg/mL (0.2%).  See the medical record for exact dosing, route, and time of administration.  This SmartLink is deprecated. Use AVSMEDLIST instead to display the medication list for a  patient. Disposition: Discharge home  Discharge Date & Time: 01/17/2017; 1600 hrs.   Physician-requested Follow-up: Return for post-procedure eval by Dr. Dossie Arbour in 2 wks. Future Appointments  Date Time Provider [place]  01/19/2017 11:30 AM [facility]  01/25/2017  2:00 PM Lloyd Huger, MD [facility] None  01/30/2017  1:15 PM Milinda Pointer, MD [facility] None   Primary Care Physician: [name], MD Location: [facility] Note by: Gaspar Cola, MD Date: 01/17/2017; Time: 4:47 PM  Disclaimer:  Medicine is not an Chief Strategy Officer. The only guarantee in medicine is that nothing is guaranteed. It is important to note that the decision to proceed with this intervention was based on the information collected from the patient. The Data and conclusions were drawn from the patient's questionnaire, the interview, and the physical examination. Because the information was provided in large part by the patient, it cannot be guaranteed that it has not been purposely or unconsciously manipulated. Every effort has been made to obtain as much relevant data as possible for this evaluation. It is important to note that the conclusions that lead to this procedure are derived in large part from the available data. Always take into account that the treatment will also be dependent on availability of resources and existing treatment guidelines, considered by other Pain Management Practitioners as being common knowledge and practice, at the time of the intervention. For Medico-Legal purposes, it is also important to point out that variation in procedural techniques and pharmacological choices are the acceptable norm. The indications, contraindications, technique, and results of the above procedure should only be interpreted and judged by a Board-Certified Interventional Pain Specialist with extensive familiarity and expertise in the same exact procedure and  technique.

## 2017-01-17 NOTE — Telephone Encounter (Signed)
Notified Grace [name] that IR needs her CT to be performed before they can schedule liver biopsy. She verbalized understanding. She is requesting prescription for phenergan for nausea. Reports zofran not effective for her. Grace [name] could you get this ordered for her?  Oncology Nurse Navigator Documentation  Navigator Location: [facility] (01/17/17 1100)   )Navigator Encounter Type: Telephone (01/17/17 1100) Telephone: [place] Call (01/17/17 1100)                                                  Time Spent with Patient: 15 (01/17/17 1100)

## 2017-01-17 NOTE — Patient Instructions (Addendum)
____________________________________________________________________________________________  Post-Procedure instructions Instructions:  Apply ice: Fill a plastic sandwich bag with crushed ice. Cover it with a small towel and apply to injection site. Apply for 15 minutes then remove x 15 minutes. Repeat sequence on day of procedure, until you go to bed. The purpose is to minimize swelling and discomfort after procedure.  Apply heat: Apply heat to procedure site starting the day following the procedure. The purpose is to treat any soreness and discomfort from the procedure.  Food intake: Start with clear liquids (like water) and advance to regular food, as tolerated.   Physical activities: Keep activities to a minimum for the first 8 hours after the procedure.   Driving: If you have received any sedation, you are not allowed to drive for 24 hours after your procedure.  Blood thinner: Restart your blood thinner 6 hours after your procedure. (Only for those taking blood thinners)  Insulin: As soon as you can eat, you may resume your normal dosing schedule. (Only for those taking insulin)  Infection prevention: Keep procedure site clean and dry.  Post-procedure Pain Diary: Extremely important that this be done correctly and accurately. Recorded information will be used to determine the next step in treatment.  Pain evaluated is that of treated area only. Do not include pain from an untreated area.  Complete every hour, on the hour, for the initial 8 hours. Set an alarm to help you do this part accurately.  Do not go to sleep and have it completed later. It will not be accurate.  Follow-up appointment: Keep your follow-up appointment after the procedure. Usually 2 weeks for most procedures. (6 weeks in the case of radiofrequency.) Bring you pain diary.  Expect:  From numbing medicine (AKA: Local Anesthetics): Numbness or decrease in pain.  Onset: Full effect within 15 minutes of  injected.  Duration: It will depend on the type of local anesthetic used. On the average, 1 to 8 hours.   From steroids: Decrease in swelling or inflammation. Once inflammation is improved, relief of the pain will follow.  Onset of benefits: Depends on the amount of swelling present. The more swelling, the longer it will take for the benefits to be seen. In some cases, up to 10 days.  Duration: Steroids will stay in the system x 2 weeks. Duration of benefits will depend on multiple posibilities including persistent irritating factors.  From procedure: Some discomfort is to be expected once the numbing medicine wears off. This should be minimal if ice and heat are applied as instructed. Call if:  You experience numbness and weakness that gets worse with time, as opposed to wearing off.  New onset bowel or bladder incontinence. (Spinal procedures only)  Emergency Numbers:  Durning business hours (Monday - Thursday, 8:00 AM - 4:00 PM) (Friday, 9:00 AM - 12:00 Noon): [phone]  After hours: [phone] ____________________________________________________________________________________________    Trigger Point Injection Trigger points are areas where you have pain. A trigger point injection is a shot given in the trigger point to help relieve pain for a few days to a few months. Common places for trigger points include:  The neck.  The shoulders.  The upper back.  The lower back.  A trigger point injection will not cure long-lasting (chronic) pain permanently. These injections do not always work for every person, but for some people they can help to relieve pain for a few days to a few months. Tell a health care provider about:  Any allergies you have.  All medicines you are taking, including vitamins, herbs, eye drops, creams, and over-the-counter medicines.  Any problems you or family members have had with anesthetic medicines.  Any blood disorders you have.  Any  surgeries you have had.  Any medical conditions you have. What are the risks? Generally, this is a safe procedure. However, problems may occur, including:  Infection.  Bleeding.  Allergic reaction to the injected medicine.  Irritation of the skin around the injection site.  What happens before the procedure?  Ask your health care provider about changing or stopping your regular medicines. This is especially important if you are taking diabetes medicines or blood thinners. What happens during the procedure?  Your health care provider will feel for trigger points. A marker may be used to circle the area for the injection.  The skin over the trigger point will be washed with a germ-killing (antiseptic) solution.  A thin needle is used for the shot. You may feel pain or a twitching feeling when the needle enters the trigger point.  A numbing solution may be injected into the trigger point. Sometimes a medicine to keep down swelling, redness, and warmth (inflammation) is also injected.  Your health care provider may move the needle around the area where the trigger point is located until the tightness and twitching goes away.  After the injection, your health care provider may put gentle pressure over the injection site.  The injection site will be covered with a bandage (dressing). The procedure may vary among health care providers and hospitals. What happens after the procedure?  The dressing can be taken off in a few hours or as told by your health care provider.  You may feel sore and stiff for 1-2 days. This information is not intended to replace advice given to you by your health care provider. Make sure you discuss any questions you have with your health care provider. Document Released: 01/20/2011 Document Revised: 10/04/2015 Document Reviewed: 07/21/2014 Elsevier Interactive Patient Education  2018 [place]. Pain Management Discharge Instructions  General Discharge  Instructions :  If you need to reach your doctor call: Monday-Friday 8:00 am - 4:00 pm at [phone] or toll free [phone].  After clinic hours [phone] to have operator reach doctor.  Bring all of your medication bottles to all your appointments in the pain clinic.  To cancel or reschedule your appointment with Pain Management please remember to call 24 hours in advance to avoid a fee.  Refer to the educational materials which you have been given on: General Risks, I had my Procedure. Discharge Instructions, Post Sedation.  Post Procedure Instructions:  The drugs you were given will stay in your system until tomorrow, so for the next 24 hours you should not drive, make any legal decisions or drink any alcoholic beverages.  You may eat anything you prefer, but it is better to start with liquids then soups and crackers, and gradually work up to solid foods.  Please notify your doctor immediately if you have any unusual bleeding, trouble breathing or pain that is not related to your normal pain.  Depending on the type of procedure that was done, some parts of your body may feel week and/or numb.  This usually clears up by tonight or the next day.  Walk with the use of an assistive device or accompanied by an adult for the 24 hours.  You may use ice on the affected area for the first 24 hours.  Put ice in a Ziploc  bag and cover with a towel and place against area 15 minutes on 15 minutes off.  You may switch to heat after 24 hours.

## 2017-01-18 ENCOUNTER — Other Ambulatory Visit: Payer: Medicare Other

## 2017-01-19 ENCOUNTER — Ambulatory Visit
Admission: RE | Admit: 2017-01-19 | Discharge: 2017-01-19 | Disposition: A | Discharge status: Home / Self Care | Payer: Medicare Other | Source: Ambulatory Visit | Attending: Oncology | Admitting: Oncology

## 2017-01-19 ENCOUNTER — Telehealth: Payer: Self-pay

## 2017-01-19 DIAGNOSIS — R160 Hepatomegaly, not elsewhere classified: Secondary | ICD-10-CM | POA: Diagnosis present

## 2017-01-19 DIAGNOSIS — C801 Malignant (primary) neoplasm, unspecified: Secondary | ICD-10-CM | POA: Diagnosis not present

## 2017-01-19 DIAGNOSIS — C787 Secondary malignant neoplasm of liver and intrahepatic bile duct: Secondary | ICD-10-CM | POA: Insufficient documentation

## 2017-01-19 DIAGNOSIS — R918 Other nonspecific abnormal finding of lung field: Secondary | ICD-10-CM | POA: Diagnosis not present

## 2017-01-19 DIAGNOSIS — C3490 Malignant neoplasm of unspecified part of unspecified bronchus or lung: Secondary | ICD-10-CM | POA: Diagnosis not present

## 2017-01-19 HISTORY — DX: Heart failure, unspecified: I50.9

## 2017-01-19 LAB — POCT I-STAT CREATININE: Creatinine, Ser: 0.8 mg/dL (ref 0.44–1.00)

## 2017-01-19 MED ORDER — IOPAMIDOL (ISOVUE-300) INJECTION 61%
100.0000 mL | Freq: Once | INTRAVENOUS | Status: AC | PRN
  Administered 2017-01-19: 100 mL via INTRAVENOUS

## 2017-01-19 NOTE — Telephone Encounter (Signed)
Voicemail left with special scheduling to return call to expedite liver biopsy. Oncology Nurse Navigator Documentation  Navigator Location: [facility] (01/19/17 1500)   )Navigator Encounter Type: Telephone (01/19/17 1500) Telephone: Grace [name] Call (01/19/17 1500)                                                  Time Spent with Patient: 15 (01/19/17 1500)     Oncology Nurse Navigator Documentation  Navigator Location: [facility] (01/19/17 1500)   )Navigator Encounter Type: Telephone (01/19/17 1500) Telephone: Grace [name] Call (01/19/17 1500)                                                  Time Spent with Patient: 15 (01/19/17 1500)

## 2017-01-20 ENCOUNTER — Telehealth: Payer: Self-pay

## 2017-01-20 ENCOUNTER — Other Ambulatory Visit: Payer: Self-pay | Admitting: *Deleted

## 2017-01-20 MED ORDER — PROMETHAZINE HCL 25 MG PO TABS: 25.0000 mg | ORAL_TABLET | Freq: Four times a day (QID) | ORAL | 2 refills | Status: DC | PRN

## 2017-01-20 NOTE — Telephone Encounter (Signed)
Contacted Grace [name] with liver biopsy appointment. She will [facility] at the medical mall entrance 12/11, Tuesday, at 1030 for 1130 appointment. She was made aware to go to the registration desk in the medical mall. Special procedures will call her with any further education/instructions. She is requesting results of CT scan. Oncology Nurse Navigator Documentation  Navigator Location: [facility] (01/20/17 1000)   )Navigator Encounter Type: Telephone (01/20/17 1000) Telephone: Incoming Call;Outgoing Call;Diagnostic Results;Appt Confirmation/Clarification (01/20/17 1000)                                                  Time Spent with Patient: 15 (01/20/17 1000)

## 2017-01-20 NOTE — Telephone Encounter (Signed)
Grace [name] is asking for someone to call her with her CT results from yesterday.

## 2017-01-20 NOTE — Telephone Encounter (Signed)
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  Will need liver biopsy to confirm.

## 2017-01-23 ENCOUNTER — Other Ambulatory Visit: Payer: Self-pay | Admitting: General Surgery

## 2017-01-24 ENCOUNTER — Telehealth: Payer: Self-pay

## 2017-01-24 ENCOUNTER — Other Ambulatory Visit: Payer: Self-pay | Admitting: *Deleted

## 2017-01-24 ENCOUNTER — Ambulatory Visit: Payer: Medicare Other

## 2017-01-24 DIAGNOSIS — Z6836 Body mass index (BMI) 36.0-36.9, adult: Secondary | ICD-10-CM | POA: Diagnosis not present

## 2017-01-24 DIAGNOSIS — M48061 Spinal stenosis, lumbar region without neurogenic claudication: Secondary | ICD-10-CM | POA: Diagnosis not present

## 2017-01-24 DIAGNOSIS — I10 Essential (primary) hypertension: Secondary | ICD-10-CM | POA: Diagnosis not present

## 2017-01-24 MED ORDER — FENTANYL 25 MCG/HR TD PT72: 25.0000 ug | MEDICATED_PATCH | TRANSDERMAL | 0 refills | Status: DC

## 2017-01-24 NOTE — Telephone Encounter (Signed)
[place] and left voicemail for [place] to return call. 01-24-2017 at 1325 ([facility])

## 2017-01-24 NOTE — Telephone Encounter (Signed)
Spoke with Anderson Malta at Dr. Adalberto Cole office. Dr. Dossie Arbour only performs procedures on Ms. Madeira, he does not prescribe her any oral medication. Dr. Earnie Larsson, neurosurgery, has been prescribing her pain medication. Dr. Grayland Ormond will add Duragesic 19mcg every 72 hours. She was educated on this medication as well as instructed on how to use. She will follow up in the clinic next week for biopsy results. Oncology Nurse Navigator Documentation  Navigator Location: [facility] (01/24/17 1400)   )Navigator Encounter Type: Telephone (01/24/17 1400) Telephone: [place] Call (01/24/17 1400)                                                  Time Spent with Patient: 15 (01/24/17 1400)

## 2017-01-24 NOTE — Telephone Encounter (Signed)
Called results of CT to Grace [name] with permission from Dr. Grayland Ormond.. All questions answered. Voicemail left with special scheduling to reschedule biopsy asap.  IMPRESSION: Chest Impression:   1. Bulky LEFT suprahilar mass extends into the LEFT hilum and AP window most consistent with bronchogenic carcinoma. Favor small cell bronchogenic carcinoma. 2. Contralateral nodal metastasis on the mediastinum. 3. LEFT suprahilar mass constricts the LEFT upper lobe bronchus as well as the LEFT main pulmonary artery.   Abdomen / Pelvis Impression:   1. Large bilobar hepatic metastasis. 2. Enlarge periportal metastatic lymph nodes. 3. High concern for skeletal metastasis within the thoracic spine and pelvis. These results will be called to the ordering clinician or representative by the Radiologist Assistant, and communication documented in the PACS or zVision Dashboard.    Oncology Nurse Navigator Documentation  Navigator Location: [facility] (01/24/17 0900)   )Navigator Encounter Type: Telephone (01/24/17 0900) Telephone: Outgoing Call;Diagnostic Results (01/24/17 0900)                                                  Time Spent with Patient: 15 (01/24/17 0900)

## 2017-01-24 NOTE — Telephone Encounter (Signed)
Liver biopsy rescheduled for 12/13 with arrival time of 0930 for 1030 appointment. [facility] at medical mall entrance. Went over these details with Ms. Pittmon. Read back performed. She is also requesting pain medication that is not controlled with her Percocet.Reports pain is in her shoulder area. She is under treatment with Dr. Dossie Arbour. Dr. Grayland Ormond notified and would like permission from Dr. Dossie Arbour prior to adding a new medication. Message left with pain management clinic to return call. Oncology Nurse Navigator Documentation  Navigator Location: [facility] (01/24/17 1200)   )Navigator Encounter Type: Telephone (01/24/17 1200) Telephone: Lahoma Crocker Call;Appt Confirmation/Clarification (01/24/17 1200)                                                  Time Spent with Patient: 15 (01/24/17 1200)

## 2017-01-25 ENCOUNTER — Ambulatory Visit: Payer: Medicare Other | Admitting: Oncology

## 2017-01-26 ENCOUNTER — Other Ambulatory Visit: Payer: Medicare Other

## 2017-01-26 ENCOUNTER — Ambulatory Visit
Admission: RE | Admit: 2017-01-26 | Discharge: 2017-01-26 | Disposition: A | Discharge status: Home / Self Care | Payer: Medicare Other | Source: Ambulatory Visit | Attending: Oncology | Admitting: Oncology

## 2017-01-26 DIAGNOSIS — R112 Nausea with vomiting, unspecified: Secondary | ICD-10-CM | POA: Diagnosis not present

## 2017-01-26 DIAGNOSIS — Z87891 Personal history of nicotine dependence: Secondary | ICD-10-CM | POA: Insufficient documentation

## 2017-01-26 DIAGNOSIS — J440 Chronic obstructive pulmonary disease with acute lower respiratory infection: Secondary | ICD-10-CM | POA: Insufficient documentation

## 2017-01-26 DIAGNOSIS — I110 Hypertensive heart disease with heart failure: Secondary | ICD-10-CM | POA: Diagnosis not present

## 2017-01-26 DIAGNOSIS — R918 Other nonspecific abnormal finding of lung field: Secondary | ICD-10-CM | POA: Insufficient documentation

## 2017-01-26 DIAGNOSIS — F419 Anxiety disorder, unspecified: Secondary | ICD-10-CM | POA: Diagnosis not present

## 2017-01-26 DIAGNOSIS — E785 Hyperlipidemia, unspecified: Secondary | ICD-10-CM | POA: Diagnosis not present

## 2017-01-26 DIAGNOSIS — F329 Major depressive disorder, single episode, unspecified: Secondary | ICD-10-CM | POA: Diagnosis not present

## 2017-01-26 DIAGNOSIS — R160 Hepatomegaly, not elsewhere classified: Secondary | ICD-10-CM | POA: Insufficient documentation

## 2017-01-26 DIAGNOSIS — I509 Heart failure, unspecified: Secondary | ICD-10-CM | POA: Insufficient documentation

## 2017-01-26 DIAGNOSIS — I429 Cardiomyopathy, unspecified: Secondary | ICD-10-CM | POA: Diagnosis not present

## 2017-01-26 DIAGNOSIS — R590 Localized enlarged lymph nodes: Secondary | ICD-10-CM | POA: Diagnosis not present

## 2017-01-26 DIAGNOSIS — Z79899 Other long term (current) drug therapy: Secondary | ICD-10-CM | POA: Diagnosis not present

## 2017-01-26 DIAGNOSIS — C787 Secondary malignant neoplasm of liver and intrahepatic bile duct: Secondary | ICD-10-CM | POA: Insufficient documentation

## 2017-01-26 DIAGNOSIS — K219 Gastro-esophageal reflux disease without esophagitis: Secondary | ICD-10-CM | POA: Insufficient documentation

## 2017-01-26 DIAGNOSIS — J189 Pneumonia, unspecified organism: Secondary | ICD-10-CM | POA: Diagnosis not present

## 2017-01-26 DIAGNOSIS — C7800 Secondary malignant neoplasm of unspecified lung: Secondary | ICD-10-CM | POA: Insufficient documentation

## 2017-01-26 DIAGNOSIS — K7689 Other specified diseases of liver: Secondary | ICD-10-CM | POA: Diagnosis not present

## 2017-01-26 LAB — CBC
HCT: 39.3 % (ref 35.0–47.0)
HEMOGLOBIN: 12.9 g/dL (ref 12.0–16.0)
MCH: 30.2 pg (ref 26.0–34.0)
MCHC: 32.9 g/dL (ref 32.0–36.0)
MCV: 92 fL (ref 80.0–100.0)
Platelets: 299 10*3/uL (ref 150–440)
RBC: 4.27 MIL/uL (ref 3.80–5.20)
RDW: 14.2 % (ref 11.5–14.5)
WBC: 11.8 10*3/uL — ABNORMAL HIGH (ref 3.6–11.0)

## 2017-01-26 LAB — PROTIME-INR
INR: 0.97
PROTHROMBIN TIME: 12.8 s (ref 11.4–15.2)

## 2017-01-26 MED ORDER — FENTANYL CITRATE (PF) 100 MCG/2ML IJ SOLN
INTRAMUSCULAR | Status: AC
  Filled 2017-01-26: qty 4

## 2017-01-26 MED ORDER — FENTANYL CITRATE (PF) 100 MCG/2ML IJ SOLN
INTRAMUSCULAR | Status: AC | PRN
  Administered 2017-01-26: 50 ug via INTRAVENOUS

## 2017-01-26 MED ORDER — MIDAZOLAM HCL 5 MG/5ML IJ SOLN
INTRAMUSCULAR | Status: AC
  Filled 2017-01-26: qty 5

## 2017-01-26 MED ORDER — SODIUM CHLORIDE 0.9 % IV SOLN
INTRAVENOUS | Status: DC
  Administered 2017-01-26: 10:00:00 via INTRAVENOUS

## 2017-01-26 MED ORDER — MIDAZOLAM HCL 5 MG/5ML IJ SOLN
INTRAMUSCULAR | Status: AC | PRN
  Administered 2017-01-26 (×2): 1 mg via INTRAVENOUS

## 2017-01-26 NOTE — H&P (Signed)
Chief Complaint: Patient was seen in consultation today for liver biopsy at the request of Finnegan,Timothy J  Referring Physician(s): Finnegan,Timothy J  Patient Status: [facility] - Out-pt  History of Present Illness: Grace [name] is a 66 y.o. female presenting with a left suprahilar lung mass and multiple hepatic metastatic lesions.  Some cramping abdominal discomfort and nausea.  Chronic SOB. Here for US guided biopsy of one of the liver lesions.  Past Medical History:  Diagnosis Date  . Anxiety   . Arthritis   . Asthma    COPD  . Cancer ([place])    Liver  . CAP (community acquired pneumonia) 06/30/2014  . Cardiomyopathy ([place])    nonischemic (EF 35-40%)  . CHF (congestive heart failure) ([place])   . COPD (chronic obstructive pulmonary disease) ([place])   . Depression   . Dyspnea   . GERD (gastroesophageal reflux disease)   . Hyperlipidemia   . Hypertension   . Pneumonia   . PVC's (premature ventricular contractions)    states 10 years ago  . Sepsis ([place]) 06/30/2014  . SOB (shortness of breath) 05/08/2014  . Spinal cord injury at T7-T12 level [facility])    2016    Past Surgical History:  Procedure Laterality Date  . APPENDECTOMY    . CARDIAC CATHETERIZATION    . CATARACT EXTRACTION     right eye   . CESAREAN SECTION    . COLONOSCOPY    . LAMINOTOMY    . LUMBAR LAMINECTOMY/DECOMPRESSION MICRODISCECTOMY Left 08/01/2016   Procedure: Left Lumbar Four-Five Laminectomy and Foraminotomy;  Surgeon: Earnie Larsson, MD;  Location: [facility];  Service: Neurosurgery;  Laterality: Left;    Allergies: Iodine  Medications: Prior to Admission medications   Medication Sig Start Date End Date Taking? Authorizing Provider  ARIPiprazole (ABILIFY) 5 MG tablet Take 5 mg by mouth daily.   Yes [provider]  DULoxetine (CYMBALTA) 60 MG capsule Take 60 mg by mouth daily.   Yes [provider]  ibuprofen (ADVIL,MOTRIN) 200 MG tablet Take 800 mg by mouth every 8 (eight) hours as  needed (for pain.).   Yes [provider]  metoprolol succinate (TOPROL-XL) 100 MG 24 hr tablet TAKE ONE TABLET BY MOUTH ONCE DAILY 08/28/15  Yes Gollan, Kathlene November, MD  promethazine (PHENERGAN) 25 MG tablet Take 1 tablet (25 mg total) by mouth every 6 (six) hours as needed for nausea or vomiting. 01/20/17  Yes Finnegan, Kathlene November, MD  sacubitril-valsartan (ENTRESTO) 49-51 MG Take 1 tablet by mouth 2 (two) times daily. 07/25/16  Yes Gollan, Kathlene November, MD  albuterol (PROAIR HFA) 108 (90 Base) MCG/ACT inhaler Inhale into the lungs. 08/09/16   [provider]  alendronate (FOSAMAX) 70 MG tablet Take 70 mg by mouth every Monday. 06/23/16   [provider]  diltiazem (CARDIZEM) 30 MG tablet Take 1 tablet (30 mg total) by mouth 3 (three) times daily as needed. Patient not taking: Reported on 01/17/2017 07/25/16   Minna Merritts, MD  fentaNYL (DURAGESIC - DOSED MCG/HR) 25 MCG/HR patch Place 1 patch (25 mcg total) onto the skin every 3 (three) days. 01/24/17   Lloyd Huger, MD  LORazepam (ATIVAN) 0.5 MG tablet Take 0.5 mg by mouth daily as needed for anxiety.     [provider]  modafinil (PROVIGIL) 200 MG tablet Take 200 mg by mouth daily as needed (for alertness 3rd shift).     [provider]  oxyCODONE-acetaminophen (PERCOCET/ROXICET) 5-325 MG tablet Take 1 tablet by  mouth every 6 (six) hours as needed. For pain. 06/15/16   [provider]     Family History  Problem Relation Age of [place]  . Heart attack Father 29       MI x 3     Social History   Socioeconomic History  . Marital status: Married    Spouse name: None  . Number of children: None  . Years of education: None  . Highest education level: None  Social Needs  . Financial resource strain: None  . Food insecurity - worry: None  . Food insecurity - inability: None  . Transportation needs - medical: None  . Transportation needs - non-medical: None  Occupational History  . None   Tobacco Use  . Smoking status: Former Smoker    Packs/day: 0.50    Years: 50.00    Pack years: 25.00    Types: Cigarettes    Last attempt to quit: 01/27/2015    Years since quitting: 2.0  . Smokeless tobacco: Never Used  Substance and Sexual Activity  . Alcohol use: No  . Drug use: No  . Sexual activity: Not Currently  Other Topics Concern  . None  Social History Narrative  . None    ECOG Status: 1 - Symptomatic but completely ambulatory  Review of Systems: A 12 point ROS discussed and pertinent positives are indicated in the HPI above.  All other systems are negative.  Review of Systems  Constitutional: Negative.   HENT: Negative.   Respiratory: Negative.   Cardiovascular: Negative.   Gastrointestinal: Positive for abdominal pain and nausea. Negative for abdominal distention, blood in stool and diarrhea.  Genitourinary: Negative.   Musculoskeletal: Negative.   Neurological: Negative.     Vital Signs: BP 103/76   Pulse 88   Temp 97.7 F (36.5 C)   Resp (!) 26   SpO2 98%   Physical Exam  Constitutional: She is oriented to person, place, and time. No distress.  HENT:  Head: Normocephalic and atraumatic.  Neck: Neck supple. No JVD present.  Cardiovascular: Normal rate, regular rhythm and normal heart sounds. Exam reveals no gallop and no friction rub.  No murmur heard. Pulmonary/Chest: Effort normal and breath sounds normal. No stridor. No respiratory distress. She has no wheezes. She has no rales.  Abdominal: Soft. Bowel sounds are normal. She exhibits no distension. There is no tenderness. There is no rebound and no guarding.  Musculoskeletal: She exhibits no edema.  Lymphadenopathy:    She has no cervical adenopathy.  Neurological: She is alert and oriented to person, place, and time.  Vitals reviewed.   Imaging: Ct Chest W Contrast  Result Date: 01/19/2017 CLINICAL DATA:  Nausea and vomiting for 3 weeks. Elevated liver function tests. Concern for liver  metastasis. Abnormal hepatic ultrasound. EXAM: CT CHEST, ABDOMEN, AND PELVIS WITH CONTRAST TECHNIQUE: Multidetector CT imaging of the chest, abdomen and pelvis was performed following the standard protocol during bolus administration of intravenous contrast. CONTRAST:  183mL ISOVUE-300 IOPAMIDOL (ISOVUE-300) INJECTION 61% COMPARISON:  Ultrasound 01/10/2017 FINDINGS: CT CHEST FINDINGS Cardiovascular: Coronary artery calcification and aortic atherosclerotic calcification. LEFT hilar mass in AP window and narrows the LEFT main pulmonary artery. No pericardial effusion Mediastinum/Nodes: No axillary or supraclavicular adenopathy. Bulky LEFT hilar and AP window nodal mass measures 8.8 by 7.5 cm. Adenopathy extends to the RIGHT lower paratracheal nodal station. Subcarinal adenopathy also is present measuring 1.6 cm short axis Lungs/Pleura: LEFT suprahilar mass constricts the LEFT upper lobe bronchus and contiguous with  the bulky LEFT hilar and AP window nodal mass described above. Mild obstructive pneumonitis in the LEFT upper lobe. Musculoskeletal: Several smudgy rounded sclerotic densities in the thoracic spine (for example T10 vertebral body level image 36, series 2). CT ABDOMEN AND PELVIS FINDINGS Hepatobiliary: Multiple hypoenhancing lesion in LEFTand RIGHT hepatic lobe. Largest lesion in the segment IVb of the liver measures 6.4 x 7.1 cm. Example lesion in segment 8 of the RIGHT hepatic lobe measures 5.0 x 4.2 cm. Approximately 10 lesions within the liver. No biliary duct dilatation. Portal vein is patent. Pancreas: Pancreas normal. Enlarged lymph node positioned between the pancreas and LEFT hepatic lobe measuring 1.5 cm short axis (image 58, series 2 Spleen: Normal spleen Adrenals/urinary tract: Adrenal glands and kidneys are normal. The ureters and bladder normal. Stomach/Bowel: Stomach, duodenum, small-bowel, cecum are normal. Appendix not identified. Several diverticula of the descending colon sigmoid colon  without acute inflammation. Rectum normal Vascular/Lymphatic: Abdominal aorta is calcified but not aneurysmal. Periportal lymph node measured 1.6 cm. No periaortic or pelvic adenopathy Reproductive: Uterus and adnexa normal Other: No peritoneal metastasis. Musculoskeletal: Subtle rounded densities within the marrow space of the LEFT and RIGHT iliac bone measures 1.5 cm each (image 92 and 89 of series 2). Prior lumbar surgery IMPRESSION: Chest Impression: 1. Bulky LEFT suprahilar mass extends into the LEFT hilum and AP window most consistent with bronchogenic carcinoma. Favor small cell bronchogenic carcinoma. 2. Contralateral nodal metastasis on the mediastinum. 3. LEFT suprahilar mass constricts the LEFT upper lobe bronchus as well as the LEFT main pulmonary artery. Abdomen / Pelvis Impression: 1. Large bilobar hepatic metastasis. 2. Enlarge periportal metastatic lymph nodes. 3. High concern for skeletal metastasis within the thoracic spine and pelvis. These results will be called to the ordering clinician or representative by the Radiologist Assistant, and communication documented in the PACS or zVision Dashboard. Electronically Signed   By: Suzy Bouchard M.D.   On: 01/19/2017 15:12   Ct Abdomen Pelvis W Contrast  Result Date: 01/19/2017 CLINICAL DATA:  Nausea and vomiting for 3 weeks. Elevated liver function tests. Concern for liver metastasis. Abnormal hepatic ultrasound. EXAM: CT CHEST, ABDOMEN, AND PELVIS WITH CONTRAST TECHNIQUE: Multidetector CT imaging of the chest, abdomen and pelvis was performed following the standard protocol during bolus administration of intravenous contrast. CONTRAST:  174mL ISOVUE-300 IOPAMIDOL (ISOVUE-300) INJECTION 61% COMPARISON:  Ultrasound 01/10/2017 FINDINGS: CT CHEST FINDINGS Cardiovascular: Coronary artery calcification and aortic atherosclerotic calcification. LEFT hilar mass in AP window and narrows the LEFT main pulmonary artery. No pericardial effusion  Mediastinum/Nodes: No axillary or supraclavicular adenopathy. Bulky LEFT hilar and AP window nodal mass measures 8.8 by 7.5 cm. Adenopathy extends to the RIGHT lower paratracheal nodal station. Subcarinal adenopathy also is present measuring 1.6 cm short axis Lungs/Pleura: LEFT suprahilar mass constricts the LEFT upper lobe bronchus and contiguous with the bulky LEFT hilar and AP window nodal mass described above. Mild obstructive pneumonitis in the LEFT upper lobe. Musculoskeletal: Several smudgy rounded sclerotic densities in the thoracic spine (for example T10 vertebral body level image 36, series 2). CT ABDOMEN AND PELVIS FINDINGS Hepatobiliary: Multiple hypoenhancing lesion in LEFTand RIGHT hepatic lobe. Largest lesion in the segment IVb of the liver measures 6.4 x 7.1 cm. Example lesion in segment 8 of the RIGHT hepatic lobe measures 5.0 x 4.2 cm. Approximately 10 lesions within the liver. No biliary duct dilatation. Portal vein is patent. Pancreas: Pancreas normal. Enlarged lymph node positioned between the pancreas and LEFT hepatic lobe measuring 1.5 cm short axis (  image 58, series 2 Spleen: Normal spleen Adrenals/urinary tract: Adrenal glands and kidneys are normal. The ureters and bladder normal. Stomach/Bowel: Stomach, duodenum, small-bowel, cecum are normal. Appendix not identified. Several diverticula of the descending colon sigmoid colon without acute inflammation. Rectum normal Vascular/Lymphatic: Abdominal aorta is calcified but not aneurysmal. Periportal lymph node measured 1.6 cm. No periaortic or pelvic adenopathy Reproductive: Uterus and adnexa normal Other: No peritoneal metastasis. Musculoskeletal: Subtle rounded densities within the marrow space of the LEFT and RIGHT iliac bone measures 1.5 cm each (image 92 and 89 of series 2). Prior lumbar surgery IMPRESSION: Chest Impression: 1. Bulky LEFT suprahilar mass extends into the LEFT hilum and AP window most consistent with bronchogenic carcinoma.  Favor small cell bronchogenic carcinoma. 2. Contralateral nodal metastasis on the mediastinum. 3. LEFT suprahilar mass constricts the LEFT upper lobe bronchus as well as the LEFT main pulmonary artery. Abdomen / Pelvis Impression: 1. Large bilobar hepatic metastasis. 2. Enlarge periportal metastatic lymph nodes. 3. High concern for skeletal metastasis within the thoracic spine and pelvis. These results will be called to the ordering clinician or representative by the Radiologist Assistant, and communication documented in the PACS or zVision Dashboard. Electronically Signed   By: Suzy Bouchard M.D.   On: 01/19/2017 15:12   Dg C-arm 1-60 Min-no Report  Result Date: 01/17/2017 Fluoroscopy was utilized by the requesting physician.  No radiographic interpretation.   Dg C-arm 1-60 Min-no Report  Result Date: 12/27/2016 Fluoroscopy was utilized by the requesting physician.  No radiographic interpretation.   US Abdomen Limited Ruq  Result Date: 01/10/2017 CLINICAL DATA:  Elevated LFTs with nausea for 3 weeks EXAM: ULTRASOUND ABDOMEN LIMITED RIGHT UPPER QUADRANT COMPARISON:  CT abdomen and pelvis 02/21/2014 FINDINGS: Gallbladder: Normally distended without stones or wall thickening. No pericholecystic fluid or sonographic Murphy sign. Common bile duct: Diameter: Normal caliber 5 mm diameter Liver: Parenchymal echogenicity normal. Multiple hepatic masses are identified. A LEFT lobe lesion measures 3.5 x 3.5 x 3.8 cm. Largest lesion in RIGHT lobe measures 6.2 x 5.9 x 6.5 cm. These lesions were not visualized on a prior noncontrast CT from 2016. Findings are highly suspicious for metastatic disease. Portal vein is patent on color Doppler imaging with normal direction of blood flow towards the liver. No RIGHT upper quadrant free fluid. IMPRESSION: Multiple soft tissue masses within the liver up to 6.5 cm in size highly concerning for hepatic metastases. Findings called to Wayland Denis PA on 01/10/2017 at 1045  hrs. Electronically Signed   By: Lavonia Dana M.D.   On: 01/10/2017 10:47    Labs:  CBC: Recent Labs    07/25/16 1530 01/13/17 1502 01/26/17 1007  WBC 11.6* 10.6 11.8*  HGB 13.9 12.7 12.9  HCT 42.7 38.5 39.3  PLT 237 251 299    COAGS: Recent Labs    01/13/17 1502 01/26/17 1007  INR 0.97 0.97  APTT 26  --     BMP: Recent Labs    07/25/16 1530 10/11/16 0941 01/13/17 1502 01/19/17 1138  NA 137 140 139  --   K 4.0 4.1 4.5  --   CL 104 100 109  --   CO2 22 21 19*  --   GLUCOSE 123* 115* 104*  --   BUN 12 10 26*  --   CALCIUM 9.2 9.3 8.5*  --   CREATININE 0.71 0.75 1.18* 0.80  GFRNONAA >60 84 47*  --   GFRAA >60 97 55*  --     LIVER FUNCTION TESTS:  Recent Labs    01/13/17 1502  BILITOT 0.5  AST 32  ALT 36  ALKPHOS 76  PROT 8.1  ALBUMIN 3.8    Assessment and Plan:  For ultrasound guided biopsy of one of several metastatic liver masses. Risks and benefits discussed with the patient including, but not limited to bleeding, infection, damage to adjacent structures or low yield requiring additional tests. All of the patient's questions were answered, patient is agreeable to proceed. Consent signed and in chart.  Thank you for this interesting consult.  I greatly enjoyed meeting JA PISTOLE and look forward to participating in their care.  A copy of this report was sent to the requesting provider on this date.  Electronically Signed: Azzie Roup, MD 01/26/2017, 10:41 AM   I spent a total of 30 Minutes in face to face in clinical consultation, greater than 50% of which was counseling/coordinating care for liver biopsy.

## 2017-01-26 NOTE — Procedures (Signed)
Interventional Radiology Procedure Note  Procedure: Ultrasound guided biopsy of liver  Complications: None  Estimated Blood Loss: < 10 mL  Recommendations: 18 G core biopsy x 3 via 17 G needle of 5 cm left lobe liver mass.  Gelfoam slurry injected on completion.  Venetia Night. Kathlene Cote, M.D Pager:  [phone]

## 2017-01-28 DIAGNOSIS — C3490 Malignant neoplasm of unspecified part of unspecified bronchus or lung: Secondary | ICD-10-CM | POA: Insufficient documentation

## 2017-01-28 NOTE — Progress Notes (Signed)
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  INTERVAL HISTORY: Patient returns to clinic today for further evaluation, discussion of her imaging and pathology results, and treatment planning. She has chronic pain and is seen in the pain clinic on a regular basis, but otherwise feels well.  She has no neurologic complaints.  She denies any recent fevers or illnesses.  She has a good appetite and denies weight loss.  She has no chest pain, cough, hemoptysis, or shortness of breath.  She denies any nausea, vomiting, constipation, or diarrhea.  She has no abdominal pain.  She denies any melena or hematochezia.  She has no urinary complaints.  Patient offers no further specific complaints today.  REVIEW OF SYSTEMS:   Review of Systems  Constitutional: Negative.  Negative for fever, malaise/fatigue and weight loss.  Respiratory: Negative.  Negative for cough, hemoptysis and shortness of breath.   Cardiovascular: Negative.  Negative for chest pain and leg swelling.  Gastrointestinal: Negative.  Negative for abdominal pain, blood in stool and melena.  Genitourinary: Negative.   Musculoskeletal: Positive for back pain, joint pain and neck pain.  Skin: Negative.  Negative for rash.  Neurological: Negative.  Negative for sensory change and weakness.  Psychiatric/Behavioral: Negative.  The patient is not nervous/anxious.     As per HPI. Otherwise, a complete review of systems is negative.  PAST MEDICAL HISTORY: Past Medical History:  Diagnosis Date  . Anxiety   . Arthritis   . Asthma    COPD  . Cancer ([place])    Liver  . CAP (community acquired pneumonia) 06/30/2014  . Cardiomyopathy ([place])    nonischemic (EF 35-40%)  . CHF (congestive heart failure) ([place])   . COPD (chronic obstructive pulmonary disease) ([place])   . Depression   . Dyspnea   . GERD (gastroesophageal reflux disease)   . Hyperlipidemia   . Hypertension   . Pneumonia   . PVC's (premature ventricular contractions)    states 10 years ago  . Sepsis ([place]) 06/30/2014  . SOB (shortness of breath) 05/08/2014  . Spinal cord injury at T7-T12 level [facility])    2016    PAST SURGICAL HISTORY: Past Surgical History:  Procedure Laterality Date  . APPENDECTOMY    . CARDIAC CATHETERIZATION    . CATARACT EXTRACTION     right eye   . CESAREAN SECTION    . COLONOSCOPY    . LAMINOTOMY    . LUMBAR LAMINECTOMY/DECOMPRESSION MICRODISCECTOMY Left 08/01/2016   Procedure: Left Lumbar Four-Five Laminectomy and Foraminotomy;  Surgeon: Earnie Larsson, MD;  Location: [facility];  Service: Neurosurgery;  Laterality: Left;    FAMILY HISTORY: Family History  Problem Relation Age of Onset  . Heart attack Father 55       MI x 3     ADVANCED DIRECTIVES (Y/N):  N  HEALTH MAINTENANCE: Social History   Tobacco Use  . Smoking status: Former Smoker    Packs/day: 0.50    Years: 50.00    Pack years: 25.00    Types: Cigarettes    Last attempt to quit: 01/27/2015    Years since quitting: 2.0  . Smokeless tobacco: Never Used  Substance Use Topics  . Alcohol use: No  . Drug  use: No     Colonoscopy:  PAP:  Bone density:  Lipid panel:  Allergies  Allergen Reactions  . Iodine Anaphylaxis    IVP dye    Current Outpatient Medications  Medication Sig Dispense Refill  . albuterol (PROAIR HFA) 108 (90 Base) MCG/ACT inhaler Inhale into the lungs.    Marland Kitchen alendronate (FOSAMAX) 70 MG tablet Take 70 mg by mouth every Monday.    . ARIPiprazole (ABILIFY) 5 MG tablet Take 5 mg by mouth daily.    . DULoxetine (CYMBALTA) 60 MG capsule Take 60 mg by mouth daily.    . fentaNYL (DURAGESIC - DOSED MCG/HR) 25 MCG/HR patch Place 1 patch (25 mcg total) onto  the skin every 3 (three) days. 10 patch 0  . ibuprofen (ADVIL,MOTRIN) 200 MG tablet Take 800 mg by mouth every 8 (eight) hours as needed (for pain.).    Marland Kitchen LORazepam (ATIVAN) 0.5 MG tablet Take 0.5 mg by mouth daily as needed for anxiety.     . metoprolol succinate (TOPROL-XL) 100 MG 24 hr tablet TAKE ONE TABLET BY MOUTH ONCE DAILY 30 tablet 0  . modafinil (PROVIGIL) 200 MG tablet Take 200 mg by mouth daily as needed (for alertness 3rd shift).     . promethazine (PHENERGAN) 25 MG tablet Take 1 tablet (25 mg total) by mouth every 6 (six) hours as needed for nausea or vomiting. 30 tablet 2  . sacubitril-valsartan (ENTRESTO) 49-51 MG Take 1 tablet by mouth 2 (two) times daily. 60 tablet 6  . oxyCODONE (OXYCONTIN) 20 mg 12 hr tablet Take 1 tablet (20 mg total) by mouth every 12 (twelve) hours. 60 tablet 0  . oxyCODONE-acetaminophen (PERCOCET/ROXICET) 5-325 MG tablet Take 1-2 tablets by mouth every 6 (six) hours as needed. For pain. 60 tablet 0   No current facility-administered medications for this visit.     OBJECTIVE: Vitals:   01/30/17 1411  BP: 95/68  Pulse: 74  Resp: 20  Temp: (!) 96.6 F (35.9 C)     There is no height or weight on file to calculate BMI.    ECOG FS:2 - Symptomatic, <50% confined to bed  General: Well-developed, well-nourished, no acute distress. Eyes: Pink conjunctiva, anicteric sclera. Lungs: Clear to auscultation bilaterally. Heart: Regular rate and rhythm. No rubs, murmurs, or gallops. Abdomen: Soft, nontender, nondistended. No organomegaly noted, normoactive bowel sounds. Musculoskeletal: No edema, cyanosis, or clubbing. Neuro: Alert, answering all questions appropriately. Cranial nerves grossly intact. Skin: No rashes or petechiae noted. Psych: Normal affect.   LAB RESULTS:  Lab Results  Component Value Date   NA 139 01/13/2017   K 4.5 01/13/2017   CL 109 01/13/2017   CO2 19 (L) 01/13/2017   GLUCOSE 104 (H) 01/13/2017   BUN 26 (H) 01/13/2017    CREATININE 0.80 01/19/2017   CALCIUM 8.5 (L) 01/13/2017   PROT 8.1 01/13/2017   ALBUMIN 3.8 01/13/2017   AST 32 01/13/2017   ALT 36 01/13/2017   ALKPHOS 76 01/13/2017   BILITOT 0.5 01/13/2017   GFRNONAA 47 (L) 01/13/2017   GFRAA 55 (L) 01/13/2017    Lab Results  Component Value Date   WBC 11.8 (H) 01/26/2017   NEUTROABS 8.1 (H) 01/13/2017   HGB 12.9 01/26/2017   HCT 39.3 01/26/2017   MCV 92.0 01/26/2017   PLT 299 01/26/2017     STUDIES: Ct Chest W Contrast  Result Date: 01/19/2017 CLINICAL DATA:  Nausea and vomiting for 3 weeks. Elevated liver function tests. Concern for liver metastasis. Abnormal hepatic  ultrasound. EXAM: CT CHEST, ABDOMEN, AND PELVIS WITH CONTRAST TECHNIQUE: Multidetector CT imaging of the chest, abdomen and pelvis was performed following the standard protocol during bolus administration of intravenous contrast. CONTRAST:  127mL ISOVUE-300 IOPAMIDOL (ISOVUE-300) INJECTION 61% COMPARISON:  Ultrasound 01/10/2017 FINDINGS: CT CHEST FINDINGS Cardiovascular: Coronary artery calcification and aortic atherosclerotic calcification. LEFT hilar mass in AP window and narrows the LEFT main pulmonary artery. No pericardial effusion Mediastinum/Nodes: No axillary or supraclavicular adenopathy. Bulky LEFT hilar and AP window nodal mass measures 8.8 by 7.5 cm. Adenopathy extends to the RIGHT lower paratracheal nodal station. Subcarinal adenopathy also is present measuring 1.6 cm short axis Lungs/Pleura: LEFT suprahilar mass constricts the LEFT upper lobe bronchus and contiguous with the bulky LEFT hilar and AP window nodal mass described above. Mild obstructive pneumonitis in the LEFT upper lobe. Musculoskeletal: Several smudgy rounded sclerotic densities in the thoracic spine (for example T10 vertebral body level image 36, series 2). CT ABDOMEN AND PELVIS FINDINGS Hepatobiliary: Multiple hypoenhancing lesion in LEFTand RIGHT hepatic lobe. Largest lesion in the segment IVb of the liver  measures 6.4 x 7.1 cm. Example lesion in segment 8 of the RIGHT hepatic lobe measures 5.0 x 4.2 cm. Approximately 10 lesions within the liver. No biliary duct dilatation. Portal vein is patent. Pancreas: Pancreas normal. Enlarged lymph node positioned between the pancreas and LEFT hepatic lobe measuring 1.5 cm short axis (image 58, series 2 Spleen: Normal spleen Adrenals/urinary tract: Adrenal glands and kidneys are normal. The ureters and bladder normal. Stomach/Bowel: Stomach, duodenum, small-bowel, cecum are normal. Appendix not identified. Several diverticula of the descending colon sigmoid colon without acute inflammation. Rectum normal Vascular/Lymphatic: Abdominal aorta is calcified but not aneurysmal. Periportal lymph node measured 1.6 cm. No periaortic or pelvic adenopathy Reproductive: Uterus and adnexa normal Other: No peritoneal metastasis. Musculoskeletal: Subtle rounded densities within the marrow space of the LEFT and RIGHT iliac bone measures 1.5 cm each (image 92 and 89 of series 2). Prior lumbar surgery IMPRESSION: Chest Impression: 1. Bulky LEFT suprahilar mass extends into the LEFT hilum and AP window most consistent with bronchogenic carcinoma. Favor small cell bronchogenic carcinoma. 2. Contralateral nodal metastasis on the mediastinum. 3. LEFT suprahilar mass constricts the LEFT upper lobe bronchus as well as the LEFT main pulmonary artery. Abdomen / Pelvis Impression: 1. Large bilobar hepatic metastasis. 2. Enlarge periportal metastatic lymph nodes. 3. High concern for skeletal metastasis within the thoracic spine and pelvis. These results will be called to the ordering clinician or representative by the Radiologist Assistant, and communication documented in the PACS or zVision Dashboard. Electronically Signed   By: Suzy Bouchard M.D.   On: 01/19/2017 15:12   Ct Abdomen Pelvis W Contrast  Result Date: 01/19/2017 CLINICAL DATA:  Nausea and vomiting for 3 weeks. Elevated liver function  tests. Concern for liver metastasis. Abnormal hepatic ultrasound. EXAM: CT CHEST, ABDOMEN, AND PELVIS WITH CONTRAST TECHNIQUE: Multidetector CT imaging of the chest, abdomen and pelvis was performed following the standard protocol during bolus administration of intravenous contrast. CONTRAST:  128mL ISOVUE-300 IOPAMIDOL (ISOVUE-300) INJECTION 61% COMPARISON:  Ultrasound 01/10/2017 FINDINGS: CT CHEST FINDINGS Cardiovascular: Coronary artery calcification and aortic atherosclerotic calcification. LEFT hilar mass in AP window and narrows the LEFT main pulmonary artery. No pericardial effusion Mediastinum/Nodes: No axillary or supraclavicular adenopathy. Bulky LEFT hilar and AP window nodal mass measures 8.8 by 7.5 cm. Adenopathy extends to the RIGHT lower paratracheal nodal station. Subcarinal adenopathy also is present measuring 1.6 cm short axis Lungs/Pleura: LEFT suprahilar mass constricts the  LEFT upper lobe bronchus and contiguous with the bulky LEFT hilar and AP window nodal mass described above. Mild obstructive pneumonitis in the LEFT upper lobe. Musculoskeletal: Several smudgy rounded sclerotic densities in the thoracic spine (for example T10 vertebral body level image 36, series 2). CT ABDOMEN AND PELVIS FINDINGS Hepatobiliary: Multiple hypoenhancing lesion in LEFTand RIGHT hepatic lobe. Largest lesion in the segment IVb of the liver measures 6.4 x 7.1 cm. Example lesion in segment 8 of the RIGHT hepatic lobe measures 5.0 x 4.2 cm. Approximately 10 lesions within the liver. No biliary duct dilatation. Portal vein is patent. Pancreas: Pancreas normal. Enlarged lymph node positioned between the pancreas and LEFT hepatic lobe measuring 1.5 cm short axis (image 58, series 2 Spleen: Normal spleen Adrenals/urinary tract: Adrenal glands and kidneys are normal. The ureters and bladder normal. Stomach/Bowel: Stomach, duodenum, small-bowel, cecum are normal. Appendix not identified. Several diverticula of the  descending colon sigmoid colon without acute inflammation. Rectum normal Vascular/Lymphatic: Abdominal aorta is calcified but not aneurysmal. Periportal lymph node measured 1.6 cm. No periaortic or pelvic adenopathy Reproductive: Uterus and adnexa normal Other: No peritoneal metastasis. Musculoskeletal: Subtle rounded densities within the marrow space of the LEFT and RIGHT iliac bone measures 1.5 cm each (image 92 and 89 of series 2). Prior lumbar surgery IMPRESSION: Chest Impression: 1. Bulky LEFT suprahilar mass extends into the LEFT hilum and AP window most consistent with bronchogenic carcinoma. Favor small cell bronchogenic carcinoma. 2. Contralateral nodal metastasis on the mediastinum. 3. LEFT suprahilar mass constricts the LEFT upper lobe bronchus as well as the LEFT main pulmonary artery. Abdomen / Pelvis Impression: 1. Large bilobar hepatic metastasis. 2. Enlarge periportal metastatic lymph nodes. 3. High concern for skeletal metastasis within the thoracic spine and pelvis. These results will be called to the ordering clinician or representative by the Radiologist Assistant, and communication documented in the PACS or zVision Dashboard. Electronically Signed   By: Suzy Bouchard M.D.   On: 01/19/2017 15:12   US Biopsy (liver)  Result Date: 01/26/2017 CLINICAL DATA:  Large left suprahilar lung mass with multiple liver lesions. The patient presents for biopsy of 1 of the liver masses. EXAM: ULTRASOUND GUIDED CORE BIOPSY OF LIVER MEDICATIONS: 2.0 mg IV Versed; 50 mcg IV Fentanyl Total Moderate Sedation Time: 12 minutes. The patient's level of consciousness and physiologic status were continuously monitored during the procedure by Radiology nursing. PROCEDURE: The procedure, risks, benefits, and alternatives were explained to the patient. Questions regarding the procedure were encouraged and answered. The patient understands and consents to the procedure. A time out was performed prior to initiating the  procedure. Ultrasound was performed to localize liver lesions. The abdominal wall was prepped with chlorhexidine in a sterile fashion, and a sterile drape was applied covering the operative field. A sterile gown and sterile gloves were used for the procedure. Local anesthesia was provided with 1% Lidocaine. Under ultrasound guidance, a 17 gauge needle was advanced into a lesion within the left lobe of the liver. Coaxial 18 gauge core biopsy samples were obtained. A total of 3 samples were submitted in formalin. After biopsy, a slurry of Gel-Foam pledgets was injected via the outer needle as it was retracted. Additional ultrasound was performed. COMPLICATIONS: None. FINDINGS: Multiple large hypoechoic masses are seen throughout the liver parenchyma. A lesion in the lateral segment of the left lobe measuring approximately 5.2 cm in greatest diameter was chosen for sampling. Solid tissue was obtained. IMPRESSION: Ultrasound-guided core biopsy performed of a 5 cm mass  within the lateral segment of the left lobe of the liver. Electronically Signed   By: Aletta Edouard M.D.   On: 01/26/2017 12:25   Dg C-arm 1-60 Min-no Report  Result Date: 01/17/2017 Fluoroscopy was utilized by the requesting physician.  No radiographic interpretation.   US Abdomen Limited Ruq  Result Date: 01/10/2017 CLINICAL DATA:  Elevated LFTs with nausea for 3 weeks EXAM: ULTRASOUND ABDOMEN LIMITED RIGHT UPPER QUADRANT COMPARISON:  CT abdomen and pelvis 02/21/2014 FINDINGS: Gallbladder: Normally distended without stones or wall thickening. No pericholecystic fluid or sonographic Murphy sign. Common bile duct: Diameter: Normal caliber 5 mm diameter Liver: Parenchymal echogenicity normal. Multiple hepatic masses are identified. A LEFT lobe lesion measures 3.5 x 3.5 x 3.8 cm. Largest lesion in RIGHT lobe measures 6.2 x 5.9 x 6.5 cm. These lesions were not visualized on a prior noncontrast CT from 2016. Findings are highly suspicious for  metastatic disease. Portal vein is patent on color Doppler imaging with normal direction of blood flow towards the liver. No RIGHT upper quadrant free fluid. IMPRESSION: Multiple soft tissue masses within the liver up to 6.5 cm in size highly concerning for hepatic metastases. Findings called to Wayland Denis PA on 01/10/2017 at 1045 hrs. Electronically Signed   By: Lavonia Dana M.D.   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  PLAN:  1. 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  Liver biopsy confirms small cell carcinoma from lung origin.  We will get a PET scan and a MRI of the brain to complete the staging workup.  Patient will also have a port placement in the next week.  After lengthy discussion with the patient she wishes to pursue palliative chemotherapy and will receive carboplatinum, Tecentriq, and etoposide on day 1 and that etoposide only on days 2 and 3.  Patient will also have Neulasta support.  This will be a 21-day cycle with repeat imaging after 4 treatments.  If there is improvement of her disease burden, will then continue Tecentriq maintenance treatment every 3 weeks.  Patient will also receive Zometa on the cycles for her bony disease.  Return to clinic February 08, 2017 for consider of station of cycle 1, day 1.   2.  Liver enzymes: Resolved.  Approximately 30 minutes was spent in discussion of which greater than 50% was consultation.  Patient expressed understanding and was in agreement with this plan. She also understands that She can call clinic at any time with any questions, concerns, or complaints.   Cancer Staging Small cell lung cancer [facility]) Staging form: Lung, AJCC 8th Edition - Clinical stage from 01/28/2017: Stage IV (cT4, cN3, pM1c) - Signed by Lloyd Huger, MD on 01/28/2017   Lloyd Huger, MD   01/31/2017  11:50 AM

## 2017-01-30 ENCOUNTER — Inpatient Hospital Stay: Payer: Medicare Other | Attending: Oncology | Admitting: Oncology

## 2017-01-30 ENCOUNTER — Ambulatory Visit: Payer: Medicare Other | Admitting: Pain Medicine

## 2017-01-30 ENCOUNTER — Other Ambulatory Visit: Payer: Self-pay

## 2017-01-30 ENCOUNTER — Other Ambulatory Visit: Payer: Self-pay | Admitting: Pathology

## 2017-01-30 VITALS — BP 95/68 | HR 74 | Temp 96.6°F | Resp 20

## 2017-01-30 DIAGNOSIS — K219 Gastro-esophageal reflux disease without esophagitis: Secondary | ICD-10-CM | POA: Insufficient documentation

## 2017-01-30 DIAGNOSIS — Z87891 Personal history of nicotine dependence: Secondary | ICD-10-CM | POA: Insufficient documentation

## 2017-01-30 DIAGNOSIS — I10 Essential (primary) hypertension: Secondary | ICD-10-CM | POA: Diagnosis not present

## 2017-01-30 DIAGNOSIS — C787 Secondary malignant neoplasm of liver and intrahepatic bile duct: Secondary | ICD-10-CM | POA: Diagnosis not present

## 2017-01-30 DIAGNOSIS — J449 Chronic obstructive pulmonary disease, unspecified: Secondary | ICD-10-CM | POA: Diagnosis not present

## 2017-01-30 DIAGNOSIS — F329 Major depressive disorder, single episode, unspecified: Secondary | ICD-10-CM | POA: Insufficient documentation

## 2017-01-30 DIAGNOSIS — C7951 Secondary malignant neoplasm of bone: Secondary | ICD-10-CM | POA: Insufficient documentation

## 2017-01-30 DIAGNOSIS — R599 Enlarged lymph nodes, unspecified: Secondary | ICD-10-CM | POA: Insufficient documentation

## 2017-01-30 DIAGNOSIS — Z5111 Encounter for antineoplastic chemotherapy: Secondary | ICD-10-CM | POA: Insufficient documentation

## 2017-01-30 DIAGNOSIS — F419 Anxiety disorder, unspecified: Secondary | ICD-10-CM | POA: Diagnosis not present

## 2017-01-30 DIAGNOSIS — Z79899 Other long term (current) drug therapy: Secondary | ICD-10-CM | POA: Diagnosis not present

## 2017-01-30 DIAGNOSIS — R948 Abnormal results of function studies of other organs and systems: Secondary | ICD-10-CM | POA: Insufficient documentation

## 2017-01-30 DIAGNOSIS — K5730 Diverticulosis of large intestine without perforation or abscess without bleeding: Secondary | ICD-10-CM | POA: Insufficient documentation

## 2017-01-30 DIAGNOSIS — I509 Heart failure, unspecified: Secondary | ICD-10-CM | POA: Diagnosis not present

## 2017-01-30 DIAGNOSIS — I429 Cardiomyopathy, unspecified: Secondary | ICD-10-CM | POA: Insufficient documentation

## 2017-01-30 DIAGNOSIS — R112 Nausea with vomiting, unspecified: Secondary | ICD-10-CM

## 2017-01-30 DIAGNOSIS — C3490 Malignant neoplasm of unspecified part of unspecified bronchus or lung: Secondary | ICD-10-CM

## 2017-01-30 DIAGNOSIS — Z7689 Persons encountering health services in other specified circumstances: Secondary | ICD-10-CM | POA: Insufficient documentation

## 2017-01-30 DIAGNOSIS — E785 Hyperlipidemia, unspecified: Secondary | ICD-10-CM

## 2017-01-30 DIAGNOSIS — C3412 Malignant neoplasm of upper lobe, left bronchus or lung: Secondary | ICD-10-CM | POA: Insufficient documentation

## 2017-01-30 DIAGNOSIS — M129 Arthropathy, unspecified: Secondary | ICD-10-CM | POA: Diagnosis not present

## 2017-01-30 DIAGNOSIS — Z8619 Personal history of other infectious and parasitic diseases: Secondary | ICD-10-CM | POA: Insufficient documentation

## 2017-01-30 DIAGNOSIS — Z5112 Encounter for antineoplastic immunotherapy: Secondary | ICD-10-CM | POA: Diagnosis not present

## 2017-01-30 DIAGNOSIS — I700 Atherosclerosis of aorta: Secondary | ICD-10-CM | POA: Insufficient documentation

## 2017-01-30 DIAGNOSIS — Z8701 Personal history of pneumonia (recurrent): Secondary | ICD-10-CM | POA: Diagnosis not present

## 2017-01-30 DIAGNOSIS — Z7189 Other specified counseling: Secondary | ICD-10-CM

## 2017-01-30 LAB — SURGICAL PATHOLOGY

## 2017-01-30 MED ORDER — OXYCODONE-ACETAMINOPHEN 5-325 MG PO TABS: 1.0000 | ORAL_TABLET | Freq: Four times a day (QID) | ORAL | 0 refills | Status: DC | PRN

## 2017-01-30 MED ORDER — OXYCODONE HCL ER 20 MG PO T12A: 20.0000 mg | EXTENDED_RELEASE_TABLET | Freq: Two times a day (BID) | ORAL | 0 refills | Status: DC

## 2017-01-30 NOTE — Progress Notes (Signed)
Patient here today for follow up and results.

## 2017-01-30 NOTE — Progress Notes (Signed)
START ON PATHWAY REGIMEN - Small Cell Lung     Cycles 1 through 4, every 21 days:     Atezolizumab      Carboplatin      Etoposide    Cycles 5 and beyond, every 21 days:     Atezolizumab   **Always confirm dose/schedule in your pharmacy ordering system**    Patient Characteristics: Extensive Stage, First Line Stage Classification: Extensive AJCC T Category: T4 AJCC N Category: N3 AJCC M Category: M1c AJCC 8 Stage Grouping: IVB Line of therapy: First Line Would you be surprised if this patient died  in the next year<= I would NOT be surprised if this patient died in the next year Intent of Therapy: Non-Curative / Palliative Intent, Discussed with Patient

## 2017-01-31 MED ORDER — LIDOCAINE-PRILOCAINE 2.5-2.5 % EX CREA: TOPICAL_CREAM | CUTANEOUS | 3 refills | Status: DC

## 2017-01-31 MED ORDER — PROCHLORPERAZINE MALEATE 10 MG PO TABS: 10.0000 mg | ORAL_TABLET | Freq: Four times a day (QID) | ORAL | 2 refills | Status: DC | PRN

## 2017-01-31 MED ORDER — ONDANSETRON HCL 8 MG PO TABS: 8.0000 mg | ORAL_TABLET | Freq: Two times a day (BID) | ORAL | 2 refills | Status: DC | PRN

## 2017-02-01 ENCOUNTER — Other Ambulatory Visit (INDEPENDENT_AMBULATORY_CARE_PROVIDER_SITE_OTHER): Payer: Self-pay | Admitting: Vascular Surgery

## 2017-02-01 NOTE — Patient Instructions (Signed)
Atezolizumab injection What is this medicine? ATEZOLIZUMAB (a te zoe LIZ ue mab) is a monoclonal antibody. It is used to treat bladder cancer (urothelial cancer) and non-small cell lung cancer. This medicine may be used for other purposes; ask your health care provider or pharmacist if you have questions. COMMON BRAND NAME(S): Tecentriq What should I tell my health care provider before I take this medicine? They need to know if you have any of these conditions: -diabetes -immune system problems -infection -inflammatory bowel disease -liver disease -lung or breathing disease -lupus -nervous system problems like myasthenia gravis or Guillain-Barre syndrome -organ transplant -an unusual or allergic reaction to atezolizumab, other medicines, foods, dyes, or preservatives -pregnant or trying to get pregnant -breast-feeding How should I use this medicine? This medicine is for infusion into a vein. It is given by a health care professional in a hospital or clinic setting. A special MedGuide will be given to you before each treatment. Be sure to read this information carefully each time. Talk to your pediatrician regarding the use of this medicine in children. Special care may be needed. Overdosage: If you think you have taken too much of this medicine contact a poison control center or emergency room at once. NOTE: This medicine is only for you. Do not share this medicine with others. What if I miss a dose? It is important not to miss your dose. Call your doctor or health care professional if you are unable to keep an appointment. What may interact with this medicine? Interactions have not been studied. This list may not describe all possible interactions. Give your health care provider a list of all the medicines, herbs, non-prescription drugs, or dietary supplements you use. Also tell them if you smoke, drink alcohol, or use illegal drugs. Some items may interact with your medicine. What  should I watch for while using this medicine? Your condition will be monitored carefully while you are receiving this medicine. You may need blood work done while you are taking this medicine. Do not become pregnant while taking this medicine or for at least 5 months after stopping it. Women should inform their doctor if they wish to become pregnant or think they might be pregnant. There is a potential for serious side effects to an unborn child. Talk to your health care professional or pharmacist for more information. Do not breast-feed an infant while taking this medicine or for at least 5 months after the last dose. What side effects may I notice from receiving this medicine? Side effects that you should report to your doctor or health care professional as soon as possible: -allergic reactions like skin rash, itching or hives, swelling of the face, lips, or tongue -black, tarry stools -bloody or watery diarrhea -breathing problems -changes in vision -chest pain or chest tightness -chills -facial flushing -fever -headache -signs and symptoms of high blood sugar such as dizziness; dry mouth; dry skin; fruity breath; nausea; stomach pain; increased hunger or thirst; increased urination -signs and symptoms of liver injury like dark yellow or brown urine; general ill feeling or flu-like symptoms; light-colored stools; loss of appetite; nausea; right upper belly pain; unusually weak or tired; yellowing of the eyes or skin -stomach pain -trouble passing urine or change in the amount of urine Side effects that usually do not require medical attention (report to your doctor or health care professional if they continue or are bothersome): -cough -diarrhea -joint pain -muscle pain -muscle weakness -tiredness -weight loss This list may not describe all   possible side effects. Call your doctor for medical advice about side effects. You may report side effects to FDA at 1-800-FDA-1088. Where should  I keep my medicine? This drug is given in a hospital or clinic and will not be stored at home. NOTE: This sheet is a summary. It may not cover all possible information. If you have questions about this medicine, talk to your doctor, pharmacist, or health care provider.  2018 Elsevier/Gold Standard (2015-03-04 17:54:14) Etoposide, VP-16 injection What is this medicine? ETOPOSIDE, VP-16 (e toe POE side) is a chemotherapy drug. It is used to treat testicular cancer, lung cancer, and other cancers. This medicine may be used for other purposes; ask your health care provider or pharmacist if you have questions. COMMON BRAND NAME(S): Etopophos, Toposar, VePesid What should I tell my health care provider before I take this medicine? They need to know if you have any of these conditions: -infection -kidney disease -liver disease -low blood counts, like low white cell, platelet, or red cell counts -an unusual or allergic reaction to etoposide, other medicines, foods, dyes, or preservatives -pregnant or trying to get pregnant -breast-feeding How should I use this medicine? This medicine is for infusion into a vein. It is administered in a hospital or clinic by a specially trained health care professional. Talk to your pediatrician regarding the use of this medicine in children. Special care may be needed. Overdosage: If you think you have taken too much of this medicine contact a poison control center or emergency room at once. NOTE: This medicine is only for you. Do not share this medicine with others. What if I miss a dose? It is important not to miss your dose. Call your doctor or health care professional if you are unable to keep an appointment. What may interact with this medicine? -aspirin -certain medications for seizures like carbamazepine, phenobarbital, phenytoin, valproic acid -cyclosporine -levamisole -warfarin This list may not describe all possible interactions. Give your health  care provider a list of all the medicines, herbs, non-prescription drugs, or dietary supplements you use. Also tell them if you smoke, drink alcohol, or use illegal drugs. Some items may interact with your medicine. What should I watch for while using this medicine? Visit your doctor for checks on your progress. This drug may make you feel generally unwell. This is not uncommon, as chemotherapy can affect healthy cells as well as cancer cells. Report any side effects. Continue your course of treatment even though you feel ill unless your doctor tells you to stop. In some cases, you may be given additional medicines to help with side effects. Follow all directions for their use. Call your doctor or health care professional for advice if you get a fever, chills or sore throat, or other symptoms of a cold or flu. Do not treat yourself. This drug decreases your body's ability to fight infections. Try to avoid being around people who are sick. This medicine may increase your risk to bruise or bleed. Call your doctor or health care professional if you notice any unusual bleeding. Talk to your doctor about your risk of cancer. You may be more at risk for certain types of cancers if you take this medicine. Do not become pregnant while taking this medicine or for at least 6 months after stopping it. Women should inform their doctor if they wish to become pregnant or think they might be pregnant. Women of child-bearing potential will need to have a negative pregnancy test before starting this medicine. There   is a potential for serious side effects to an unborn child. Talk to your health care professional or pharmacist for more information. Do not breast-feed an infant while taking this medicine. Men must use a latex condom during sexual contact with a woman while taking this medicine and for at least 4 months after stopping it. A latex condom is needed even if you have had a vasectomy. Contact your doctor right away  if your partner becomes pregnant. Do not donate sperm while taking this medicine and for at least 4 months after you stop taking this medicine. Men should inform their doctors if they wish to father a child. This medicine may lower sperm counts. What side effects may I notice from receiving this medicine? Side effects that you should report to your doctor or health care professional as soon as possible: -allergic reactions like skin rash, itching or hives, swelling of the face, lips, or tongue -low blood counts - this medicine may decrease the number of white blood cells, red blood cells and platelets. You may be at increased risk for infections and bleeding. -signs of infection - fever or chills, cough, sore throat, pain or difficulty passing urine -signs of decreased platelets or bleeding - bruising, pinpoint red spots on the skin, black, tarry stools, blood in the urine -signs of decreased red blood cells - unusually weak or tired, fainting spells, lightheadedness -breathing problems -changes in vision -mouth or throat sores or ulcers -pain, redness, swelling or irritation at the injection site -pain, tingling, numbness in the hands or feet -redness, blistering, peeling or loosening of the skin, including inside the mouth -seizures -vomiting Side effects that usually do not require medical attention (report to your doctor or health care professional if they continue or are bothersome): -diarrhea -hair loss -loss of appetite -nausea -stomach pain This list may not describe all possible side effects. Call your doctor for medical advice about side effects. You may report side effects to FDA at 1-800-FDA-1088. Where should I keep my medicine? This drug is given in a hospital or clinic and will not be stored at home. NOTE: This sheet is a summary. It may not cover all possible information. If you have questions about this medicine, talk to your doctor, pharmacist, or health care provider.   2018 Elsevier/Gold Standard (2015-01-23 11:53:23) Carboplatin injection What is this medicine? CARBOPLATIN (KAR boe pla tin) is a chemotherapy drug. It targets fast dividing cells, like cancer cells, and causes these cells to die. This medicine is used to treat ovarian cancer and many other cancers. This medicine may be used for other purposes; ask your health care provider or pharmacist if you have questions. COMMON BRAND NAME(S): Paraplatin What should I tell my health care provider before I take this medicine? They need to know if you have any of these conditions: -blood disorders -hearing problems -kidney disease -recent or ongoing radiation therapy -an unusual or allergic reaction to carboplatin, cisplatin, other chemotherapy, other medicines, foods, dyes, or preservatives -pregnant or trying to get pregnant -breast-feeding How should I use this medicine? This drug is usually given as an infusion into a vein. It is administered in a hospital or clinic by a specially trained health care professional. Talk to your pediatrician regarding the use of this medicine in children. Special care may be needed. Overdosage: If you think you have taken too much of this medicine contact a poison control center or emergency room at once. NOTE: This medicine is only for you. Do not share   this medicine with others. What if I miss a dose? It is important not to miss a dose. Call your doctor or health care professional if you are unable to keep an appointment. What may interact with this medicine? -medicines for seizures -medicines to increase blood counts like filgrastim, pegfilgrastim, sargramostim -some antibiotics like amikacin, gentamicin, neomycin, streptomycin, tobramycin -vaccines Talk to your doctor or health care professional before taking any of these medicines: -acetaminophen -aspirin -ibuprofen -ketoprofen -naproxen This list may not describe all possible interactions. Give your health  care provider a list of all the medicines, herbs, non-prescription drugs, or dietary supplements you use. Also tell them if you smoke, drink alcohol, or use illegal drugs. Some items may interact with your medicine. What should I watch for while using this medicine? Your condition will be monitored carefully while you are receiving this medicine. You will need important blood work done while you are taking this medicine. This drug may make you feel generally unwell. This is not uncommon, as chemotherapy can affect healthy cells as well as cancer cells. Report any side effects. Continue your course of treatment even though you feel ill unless your doctor tells you to stop. In some cases, you may be given additional medicines to help with side effects. Follow all directions for their use. Call your doctor or health care professional for advice if you get a fever, chills or sore throat, or other symptoms of a cold or flu. Do not treat yourself. This drug decreases your body's ability to fight infections. Try to avoid being around people who are sick. This medicine may increase your risk to bruise or bleed. Call your doctor or health care professional if you notice any unusual bleeding. Be careful brushing and flossing your teeth or using a toothpick because you may get an infection or bleed more easily. If you have any dental work done, tell your dentist you are receiving this medicine. Avoid taking products that contain aspirin, acetaminophen, ibuprofen, naproxen, or ketoprofen unless instructed by your doctor. These medicines may hide a fever. Do not become pregnant while taking this medicine. Women should inform their doctor if they wish to become pregnant or think they might be pregnant. There is a potential for serious side effects to an unborn child. Talk to your health care professional or pharmacist for more information. Do not breast-feed an infant while taking this medicine. What side effects may I  notice from receiving this medicine? Side effects that you should report to your doctor or health care professional as soon as possible: -allergic reactions like skin rash, itching or hives, swelling of the face, lips, or tongue -signs of infection - fever or chills, cough, sore throat, pain or difficulty passing urine -signs of decreased platelets or bleeding - bruising, pinpoint red spots on the skin, black, tarry stools, nosebleeds -signs of decreased red blood cells - unusually weak or tired, fainting spells, lightheadedness -breathing problems -changes in hearing -changes in vision -chest pain -high blood pressure -low blood counts - This drug may decrease the number of white blood cells, red blood cells and platelets. You may be at increased risk for infections and bleeding. -nausea and vomiting -pain, swelling, redness or irritation at the injection site -pain, tingling, numbness in the hands or feet -problems with balance, talking, walking -trouble passing urine or change in the amount of urine Side effects that usually do not require medical attention (report to your doctor or health care professional if they continue or are bothersome): -  hair loss -loss of appetite -metallic taste in the mouth or changes in taste This list may not describe all possible side effects. Call your doctor for medical advice about side effects. You may report side effects to FDA at 1-800-FDA-1088. Where should I keep my medicine? This drug is given in a hospital or clinic and will not be stored at home. NOTE: This sheet is a summary. It may not cover all possible information. If you have questions about this medicine, talk to your doctor, pharmacist, or health care provider.  2018 Elsevier/Gold Standard (2007-05-08 14:38:05)  

## 2017-02-02 ENCOUNTER — Inpatient Hospital Stay: Payer: Medicare Other

## 2017-02-02 MED ORDER — CEFAZOLIN SODIUM-DEXTROSE 2-4 GM/100ML-% IV SOLN
2.0000 g | Freq: Once | INTRAVENOUS | Status: AC
  Administered 2017-02-03: 2 g via INTRAVENOUS

## 2017-02-03 ENCOUNTER — Encounter
Admission: RE | Disposition: A | Discharge status: Home / Self Care | Payer: Self-pay | Source: Ambulatory Visit | Attending: Vascular Surgery

## 2017-02-03 ENCOUNTER — Ambulatory Visit
Admission: RE | Admit: 2017-02-03 | Discharge: 2017-02-03 | Disposition: A | Discharge status: Home / Self Care | Payer: Medicare Other | Source: Ambulatory Visit | Attending: Vascular Surgery | Admitting: Vascular Surgery

## 2017-02-03 DIAGNOSIS — I428 Other cardiomyopathies: Secondary | ICD-10-CM | POA: Insufficient documentation

## 2017-02-03 DIAGNOSIS — E785 Hyperlipidemia, unspecified: Secondary | ICD-10-CM | POA: Insufficient documentation

## 2017-02-03 DIAGNOSIS — Z9889 Other specified postprocedural states: Secondary | ICD-10-CM | POA: Diagnosis not present

## 2017-02-03 DIAGNOSIS — Z87891 Personal history of nicotine dependence: Secondary | ICD-10-CM | POA: Insufficient documentation

## 2017-02-03 DIAGNOSIS — I509 Heart failure, unspecified: Secondary | ICD-10-CM | POA: Diagnosis not present

## 2017-02-03 DIAGNOSIS — I110 Hypertensive heart disease with heart failure: Secondary | ICD-10-CM | POA: Insufficient documentation

## 2017-02-03 DIAGNOSIS — M1990 Unspecified osteoarthritis, unspecified site: Secondary | ICD-10-CM | POA: Diagnosis not present

## 2017-02-03 DIAGNOSIS — Z87828 Personal history of other (healed) physical injury and trauma: Secondary | ICD-10-CM | POA: Diagnosis not present

## 2017-02-03 DIAGNOSIS — K219 Gastro-esophageal reflux disease without esophagitis: Secondary | ICD-10-CM | POA: Insufficient documentation

## 2017-02-03 DIAGNOSIS — C3490 Malignant neoplasm of unspecified part of unspecified bronchus or lung: Secondary | ICD-10-CM | POA: Insufficient documentation

## 2017-02-03 DIAGNOSIS — Z8505 Personal history of malignant neoplasm of liver: Secondary | ICD-10-CM | POA: Insufficient documentation

## 2017-02-03 DIAGNOSIS — F419 Anxiety disorder, unspecified: Secondary | ICD-10-CM | POA: Insufficient documentation

## 2017-02-03 DIAGNOSIS — J449 Chronic obstructive pulmonary disease, unspecified: Secondary | ICD-10-CM | POA: Diagnosis not present

## 2017-02-03 DIAGNOSIS — F329 Major depressive disorder, single episode, unspecified: Secondary | ICD-10-CM | POA: Diagnosis not present

## 2017-02-03 DIAGNOSIS — Z9841 Cataract extraction status, right eye: Secondary | ICD-10-CM | POA: Insufficient documentation

## 2017-02-03 DIAGNOSIS — Z79899 Other long term (current) drug therapy: Secondary | ICD-10-CM | POA: Insufficient documentation

## 2017-02-03 DIAGNOSIS — Z91041 Radiographic dye allergy status: Secondary | ICD-10-CM | POA: Insufficient documentation

## 2017-02-03 DIAGNOSIS — Z8249 Family history of ischemic heart disease and other diseases of the circulatory system: Secondary | ICD-10-CM | POA: Insufficient documentation

## 2017-02-03 DIAGNOSIS — Z8619 Personal history of other infectious and parasitic diseases: Secondary | ICD-10-CM | POA: Insufficient documentation

## 2017-02-03 HISTORY — PX: PORTA CATH INSERTION: CATH118285

## 2017-02-03 SURGERY — PORTA CATH INSERTION
Anesthesia: Moderate Sedation

## 2017-02-03 MED ORDER — MIDAZOLAM HCL 2 MG/2ML IJ SOLN
INTRAMUSCULAR | Status: DC | PRN
  Administered 2017-02-03: 1 mg via INTRAVENOUS
  Administered 2017-02-03: 2 mg via INTRAVENOUS
  Administered 2017-02-03: 1 mg via INTRAVENOUS

## 2017-02-03 MED ORDER — HYDROMORPHONE HCL 1 MG/ML IJ SOLN: 1.0000 mg | Freq: Once | INTRAMUSCULAR | Status: DC | PRN

## 2017-02-03 MED ORDER — LIDOCAINE HCL (PF) 1 % IJ SOLN
INTRAMUSCULAR | Status: DC | PRN
  Administered 2017-02-03: 20 mL

## 2017-02-03 MED ORDER — FENTANYL CITRATE (PF) 100 MCG/2ML IJ SOLN
INTRAMUSCULAR | Status: DC | PRN
Start: 2017-02-03 — End: 2017-02-03
  Administered 2017-02-03: 50 ug via INTRAVENOUS

## 2017-02-03 MED ORDER — FENTANYL CITRATE (PF) 100 MCG/2ML IJ SOLN
INTRAMUSCULAR | Status: AC
  Filled 2017-02-03: qty 2

## 2017-02-03 MED ORDER — ONDANSETRON HCL 4 MG/2ML IJ SOLN: 4.0000 mg | Freq: Four times a day (QID) | INTRAMUSCULAR | Status: DC | PRN

## 2017-02-03 MED ORDER — SODIUM CHLORIDE 0.9 % IV SOLN
INTRAVENOUS | Status: DC
  Administered 2017-02-03: 12:00:00 via INTRAVENOUS

## 2017-02-03 MED ORDER — MIDAZOLAM HCL 2 MG/2ML IJ SOLN
INTRAMUSCULAR | Status: AC
  Filled 2017-02-03: qty 4

## 2017-02-03 MED ORDER — LIDOCAINE-EPINEPHRINE (PF) 1 %-1:200000 IJ SOLN
INTRAMUSCULAR | Status: AC
  Filled 2017-02-03: qty 30

## 2017-02-03 MED ORDER — SODIUM CHLORIDE 0.9 % IR SOLN: Freq: Once | Status: DC

## 2017-02-03 SURGICAL SUPPLY — 10 items
COVER PROBE U/S 5X48 (MISCELLANEOUS) ×3 IMPLANT
DRAPE INCISE IOBAN 66X45 STRL (DRAPES) ×6 IMPLANT
KIT PORT POWER 8FR ISP CVUE (Miscellaneous) ×3 IMPLANT
NEEDLE ENTRY 21GA 7CM ECHOTIP (NEEDLE) ×3 IMPLANT
PACK ANGIOGRAPHY (CUSTOM PROCEDURE TRAY) ×3 IMPLANT
SET INTRO CAPELLA COAXIAL (SET/KITS/TRAYS/PACK) ×3 IMPLANT
SUT MNCRL AB 4-0 PS2 18 (SUTURE) ×3 IMPLANT
SUT PROLENE 0 CT 1 30 (SUTURE) ×3 IMPLANT
SUTURE VIC 3-0 (SUTURE) ×3 IMPLANT
TOWEL OR 17X26 4PK STRL BLUE (TOWEL DISPOSABLE) ×3 IMPLANT

## 2017-02-03 NOTE — H&P (Signed)
[facility] History & Physical Update  The patient was interviewed and re-examined.  The patient's previous History and Physical has been reviewed and is unchanged.  She has metastatic small cell lung CA.  There is no change in the plan of care. We plan to proceed with the scheduled procedure.  Hortencia Pilar, MD  02/03/2017, 1:26 PM

## 2017-02-03 NOTE — OR Nursing (Addendum)
Pt reports fentanyl makes her "crazy in head" but requested dose for this procedure. Dr Delana Meyer aware

## 2017-02-03 NOTE — Op Note (Signed)
      [facility]       Operative Note  Date: 02/03/2017  Preoperative diagnosis:  1.  Small cell lung carcinoma  Postoperative diagnosis:  Same as above  Procedures: #1. Ultrasound guidance for vascular access to the right internal jugular vein. #2. Fluoroscopic guidance for placement of catheter. #3. Placement of CT compatible Port-A-Cath, right internal jugular vein.  Surgeon: Leotis Pain, MD.   Anesthesia: Local with moderate conscious sedation for approximately 22 minutes using Versed and Fentanyl  Fluoroscopy time: less than 1 minute  Contrast used: 0  Estimated blood loss: Less than 5 cc  Indication for the procedure:  The patient is a 66 y.o.female with small cell lung carcinoma.  The patient needs a Port-A-Cath for durable venous access, chemotherapy, lab draws, and CT scans. We are asked to place this. Risks and benefits were discussed and informed consent was obtained.  Description of procedure: The patient was brought to the vascular and interventional radiology suite.  Moderate conscious sedation was administered throughout the procedure during a face to face encounter with the patient with my supervision of the RN administering medicines and monitoring the patient's vital signs, pulse oximetry, telemetry and mental status throughout from the start of the procedure until the patient was taken to the recovery room. The right neck chest and shoulder were sterilely prepped and draped, and a sterile surgical field was created. Ultrasound was used to help visualize a patent right internal jugular vein. This was then accessed under direct ultrasound guidance without difficulty with the micropuncture needle and a permanent image was recorded.  Micro-sheath was advanced.  Pressure A J-wire was placed. After skin nick and dilatation, the peel-away sheath was then placed over the wire. I then anesthetized an area under the clavicle approximately 1-2 fingerbreadths. A  transverse incision was created and an inferior pocket was created with electrocautery and blunt dissection. The port was then brought onto the field, placed into the pocket. The catheter was connected to the port and tunneled from the subclavicular incision to the access site. Fluoroscopic guidance was then used to cut the catheter to an appropriate length. The catheter was then placed through the peel-away sheath and the peel-away sheath was removed. The catheter tip was parked in excellent location under fluorocoscopic guidance in the atrial caval junction.  The wound was closed with a running 3-0 Vicryl and a 4-0 Monocryl. The access incision was closed with a single 4-0 Monocryl. The Huber needle was used to withdraw blood and flush the port with heparinized saline. Dermabond was then placed as a dressing. The patient tolerated the procedure well and was taken to the recovery room in stable condition.   Hortencia Pilar 02/03/2017 2:28 PM   This note was created with Dragon Medical transcription system. Any errors in dictation are purely unintentional.

## 2017-02-04 ENCOUNTER — Ambulatory Visit
Admission: RE | Admit: 2017-02-04 | Discharge: 2017-02-04 | Disposition: A | Discharge status: Home / Self Care | Payer: Medicare Other | Source: Ambulatory Visit | Attending: Oncology | Admitting: Oncology

## 2017-02-04 DIAGNOSIS — G9389 Other specified disorders of brain: Secondary | ICD-10-CM | POA: Diagnosis not present

## 2017-02-04 DIAGNOSIS — C3490 Malignant neoplasm of unspecified part of unspecified bronchus or lung: Secondary | ICD-10-CM | POA: Insufficient documentation

## 2017-02-04 DIAGNOSIS — C7951 Secondary malignant neoplasm of bone: Secondary | ICD-10-CM | POA: Insufficient documentation

## 2017-02-04 MED ORDER — GADOBENATE DIMEGLUMINE 529 MG/ML IV SOLN
20.0000 mL | Freq: Once | INTRAVENOUS | Status: AC | PRN
  Administered 2017-02-04: 20 mL via INTRAVENOUS

## 2017-02-05 ENCOUNTER — Encounter: Payer: Self-pay | Admitting: Vascular Surgery

## 2017-02-06 ENCOUNTER — Ambulatory Visit
Admission: RE | Admit: 2017-02-06 | Discharge: 2017-02-06 | Disposition: A | Discharge status: Home / Self Care | Payer: Medicare Other | Source: Ambulatory Visit | Attending: Oncology | Admitting: Oncology

## 2017-02-06 DIAGNOSIS — R590 Localized enlarged lymph nodes: Secondary | ICD-10-CM | POA: Diagnosis not present

## 2017-02-06 DIAGNOSIS — C772 Secondary and unspecified malignant neoplasm of intra-abdominal lymph nodes: Secondary | ICD-10-CM | POA: Insufficient documentation

## 2017-02-06 DIAGNOSIS — C7951 Secondary malignant neoplasm of bone: Secondary | ICD-10-CM | POA: Insufficient documentation

## 2017-02-06 DIAGNOSIS — R160 Hepatomegaly, not elsewhere classified: Secondary | ICD-10-CM | POA: Insufficient documentation

## 2017-02-06 DIAGNOSIS — C3490 Malignant neoplasm of unspecified part of unspecified bronchus or lung: Secondary | ICD-10-CM | POA: Insufficient documentation

## 2017-02-06 DIAGNOSIS — R918 Other nonspecific abnormal finding of lung field: Secondary | ICD-10-CM | POA: Diagnosis not present

## 2017-02-06 LAB — GLUCOSE, CAPILLARY: GLUCOSE-CAPILLARY: 123 mg/dL — AB (ref 65–99)

## 2017-02-06 MED ORDER — FLUDEOXYGLUCOSE F - 18 (FDG) INJECTION
13.1400 | Freq: Once | INTRAVENOUS | Status: AC | PRN
  Administered 2017-02-06: 13.14 via INTRAVENOUS

## 2017-02-07 NOTE — Progress Notes (Signed)
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  INTERVAL HISTORY: Patient returns to clinic today for further evaluation and initiation of cycle 1, day 1 of carboplatinum, etoposide, and Tecentriq.  She has chronic pain and is seen in the pain clinic on a regular basis, but otherwise feels well.  She has no neurologic complaints.  She denies any recent fevers or illnesses. She has a good appetite and denies weight loss.  She has no chest pain, cough, hemoptysis, or shortness of breath.  She denies any nausea, vomiting, constipation, or diarrhea.  She has no abdominal pain.  She denies any melena or hematochezia.  She has no urinary complaints.  Patient offers no further specific complaints today.  REVIEW OF SYSTEMS:   Review of Systems  Constitutional: Negative.  Negative for fever, malaise/fatigue and weight loss.  Respiratory: Negative.  Negative for cough, hemoptysis and shortness of breath.   Cardiovascular: Negative.  Negative for chest pain and leg swelling.  Gastrointestinal: Negative.  Negative for abdominal pain, blood in stool and melena.  Genitourinary: Negative.   Musculoskeletal: Positive for back pain, joint pain and neck pain.  Skin: Negative.  Negative for rash.  Neurological: Negative.  Negative for sensory change and weakness.  Psychiatric/Behavioral: Negative.  The patient is not nervous/anxious.     As per HPI. Otherwise, a complete review of systems is negative.  PAST MEDICAL HISTORY: Past Medical History:  Diagnosis Date  . Anxiety   . Arthritis   . Asthma    COPD  . Cancer ([place])    Liver  . CAP (community acquired pneumonia) 06/30/2014  . Cardiomyopathy ([place])    nonischemic (EF 35-40%)  . CHF (congestive heart failure) ([place])   . COPD (chronic obstructive pulmonary disease) ([place])   . Depression   . Dyspnea   . GERD (gastroesophageal reflux disease)   . Hyperlipidemia   . Hypertension   . Pneumonia   . PVC's (premature ventricular contractions)    states 10 years ago  . Sepsis ([place]) 06/30/2014  . SOB (shortness of breath) 05/08/2014  . Spinal cord injury at T7-T12 level [facility])    2016    PAST SURGICAL HISTORY: Past Surgical History:  Procedure Laterality Date  . APPENDECTOMY    . CARDIAC CATHETERIZATION    . CATARACT EXTRACTION     right eye   . CESAREAN SECTION    . COLONOSCOPY    . LAMINOTOMY    . LUMBAR LAMINECTOMY/DECOMPRESSION MICRODISCECTOMY Left 08/01/2016   Procedure: Left Lumbar Four-Five Laminectomy and Foraminotomy;  Surgeon: Earnie Larsson, MD;  Location: [facility];  Service: Neurosurgery;  Laterality: Left;  . PORTA CATH INSERTION N/A 02/03/2017   Procedure: PORTA CATH INSERTION;  Surgeon: Katha Cabal, MD;  Location: [place] [facility];  Service: Cardiovascular;  Laterality: N/A;    FAMILY HISTORY: Family History  Problem Relation Age of Onset  . Heart attack Father 30       MI x 3     ADVANCED DIRECTIVES (Y/N):  N  HEALTH MAINTENANCE: Social History   Tobacco Use  . Smoking status: Former Smoker    Packs/day: 0.50    Years: 50.00    Pack years: 25.00    Types: Cigarettes  Last attempt to quit: 01/27/2015    Years since quitting: 2.0  . Smokeless tobacco: Never Used  Substance Use Topics  . Alcohol use: No  . Drug use: No     Colonoscopy:  PAP:  Bone density:  Lipid panel:  Allergies  Allergen Reactions  . Iodine Anaphylaxis    IVP dye  . Fentanyl     Overly sedated and groggy, ineffective     Current Outpatient Medications  Medication Sig Dispense Refill  . albuterol (PROAIR HFA) 108 (90 Base) MCG/ACT inhaler Inhale 2 puffs into the lungs every 6 (six) hours as needed for wheezing or  shortness of breath.     Marland Kitchen alendronate (FOSAMAX) 70 MG tablet Take 70 mg by mouth every Thursday.     . ARIPiprazole (ABILIFY) 5 MG tablet Take 5 mg by mouth daily.    . Calcium Citrate-Vitamin D (CALCIUM + D PO) Take 1 tablet by mouth 2 (two) times daily.    . DULoxetine (CYMBALTA) 60 MG capsule Take 60 mg by mouth daily.    . fentaNYL (DURAGESIC - DOSED MCG/HR) 25 MCG/HR patch Place 1 patch (25 mcg total) onto the skin every 3 (three) days. 10 patch 0  . lidocaine-prilocaine (EMLA) cream Apply to affected area once 30 g 3  . LORazepam (ATIVAN) 0.5 MG tablet Take 0.5 mg by mouth daily as needed for anxiety.     . metoprolol succinate (TOPROL-XL) 100 MG 24 hr tablet TAKE ONE TABLET BY MOUTH ONCE DAILY 30 tablet 0  . Omega-3 Fatty Acids (FISH OIL) 1000 MG CAPS Take 1,000 mg by mouth daily.    . ondansetron (ZOFRAN) 8 MG tablet Take 1 tablet (8 mg total) by mouth 2 (two) times daily as needed for refractory nausea / vomiting. 30 tablet 2  . prochlorperazine (COMPAZINE) 10 MG tablet Take 1 tablet (10 mg total) by mouth every 6 (six) hours as needed (Nausea or vomiting). 60 tablet 2  . promethazine (PHENERGAN) 25 MG tablet Take 1 tablet (25 mg total) by mouth every 6 (six) hours as needed for nausea or vomiting. 30 tablet 2  . sacubitril-valsartan (ENTRESTO) 49-51 MG Take 1 tablet by mouth 2 (two) times daily. 60 tablet 6  . oxyCODONE (OXYCONTIN) 20 mg 12 hr tablet Take 1 tablet (20 mg total) by mouth every 12 (twelve) hours. (Patient not taking: Reported on 02/08/2017) 60 tablet 0  . oxyCODONE-acetaminophen (PERCOCET/ROXICET) 5-325 MG tablet Take 1-2 tablets by mouth every 6 (six) hours as needed. For pain. 120 tablet 0   No current facility-administered medications for this visit.    Facility-Administered Medications Ordered in Other Visits  Medication Dose Route Frequency Provider Last Rate Last Dose  . atezolizumab (TECENTRIQ) 1,200 mg in sodium chloride 0.9 % 250 mL chemo infusion  1,200 mg  Intravenous Once Lloyd Huger, MD      . CARBOplatin (PARAPLATIN) 550 mg in sodium chloride 0.9 % 250 mL chemo infusion  550 mg Intravenous Once Lloyd Huger, MD      . etoposide (VEPESID) 200 mg in sodium chloride 0.9 % 500 mL chemo infusion  200 mg Intravenous Once Lloyd Huger, MD      . heparin lock flush 100 unit/mL  500 Units Intravenous Once Lloyd Huger, MD      . Zoledronic Acid (ZOMETA) IVPB 4 mg  4 mg Intravenous Once Lloyd Huger, MD   4 mg at 02/08/17 1147    OBJECTIVE: Vitals:   02/08/17 1033  BP: 91/64  Pulse: 76  Resp: 18  Temp: (!) 96.7 F (35.9 C)     Body mass index is 35.28 kg/m.    ECOG FS:2 - Symptomatic, <50% confined to bed  General: Well-developed, well-nourished, no acute distress. Eyes: Pink conjunctiva, anicteric sclera. Lungs: Clear to auscultation bilaterally. Heart: Regular rate and rhythm. No rubs, murmurs, or gallops. Abdomen: Soft, nontender, nondistended. No organomegaly noted, normoactive bowel sounds. Musculoskeletal: No edema, cyanosis, or clubbing. Neuro: Alert, answering all questions appropriately. Cranial nerves grossly intact. Skin: No rashes or petechiae noted. Psych: Normal affect.   LAB RESULTS:  Lab Results  Component Value Date   NA 137 02/08/2017   K 3.2 (L) 02/08/2017   CL 101 02/08/2017   CO2 27 02/08/2017   GLUCOSE 118 (H) 02/08/2017   BUN 14 02/08/2017   CREATININE 0.71 02/08/2017   CALCIUM 8.6 (L) 02/08/2017   PROT 6.8 02/08/2017   ALBUMIN 3.3 (L) 02/08/2017   AST 37 02/08/2017   ALT 27 02/08/2017   ALKPHOS 68 02/08/2017   BILITOT 0.5 02/08/2017   GFRNONAA >60 02/08/2017   GFRAA >60 02/08/2017    Lab Results  Component Value Date   WBC 7.5 02/08/2017   NEUTROABS 5.5 02/08/2017   HGB 11.2 (L) 02/08/2017   HCT 33.5 (L) 02/08/2017   MCV 91.7 02/08/2017   PLT 209 02/08/2017     STUDIES: Ct Chest W Contrast  Result Date: 01/19/2017 CLINICAL DATA:  Nausea and vomiting  for 3 weeks. Elevated liver function tests. Concern for liver metastasis. Abnormal hepatic ultrasound. EXAM: CT CHEST, ABDOMEN, AND PELVIS WITH CONTRAST TECHNIQUE: Multidetector CT imaging of the chest, abdomen and pelvis was performed following the standard protocol during bolus administration of intravenous contrast. CONTRAST:  190mL ISOVUE-300 IOPAMIDOL (ISOVUE-300) INJECTION 61% COMPARISON:  Ultrasound 01/10/2017 FINDINGS: CT CHEST FINDINGS Cardiovascular: Coronary artery calcification and aortic atherosclerotic calcification. LEFT hilar mass in AP window and narrows the LEFT main pulmonary artery. No pericardial effusion Mediastinum/Nodes: No axillary or supraclavicular adenopathy. Bulky LEFT hilar and AP window nodal mass measures 8.8 by 7.5 cm. Adenopathy extends to the RIGHT lower paratracheal nodal station. Subcarinal adenopathy also is present measuring 1.6 cm short axis Lungs/Pleura: LEFT suprahilar mass constricts the LEFT upper lobe bronchus and contiguous with the bulky LEFT hilar and AP window nodal mass described above. Mild obstructive pneumonitis in the LEFT upper lobe. Musculoskeletal: Several smudgy rounded sclerotic densities in the thoracic spine (for example T10 vertebral body level image 36, series 2). CT ABDOMEN AND PELVIS FINDINGS Hepatobiliary: Multiple hypoenhancing lesion in LEFTand RIGHT hepatic lobe. Largest lesion in the segment IVb of the liver measures 6.4 x 7.1 cm. Example lesion in segment 8 of the RIGHT hepatic lobe measures 5.0 x 4.2 cm. Approximately 10 lesions within the liver. No biliary duct dilatation. Portal vein is patent. Pancreas: Pancreas normal. Enlarged lymph node positioned between the pancreas and LEFT hepatic lobe measuring 1.5 cm short axis (image 58, series 2 Spleen: Normal spleen Adrenals/urinary tract: Adrenal glands and kidneys are normal. The ureters and bladder normal. Stomach/Bowel: Stomach, duodenum, small-bowel, cecum are normal. Appendix not identified.  Several diverticula of the descending colon sigmoid colon without acute inflammation. Rectum normal Vascular/Lymphatic: Abdominal aorta is calcified but not aneurysmal. Periportal lymph node measured 1.6 cm. No periaortic or pelvic adenopathy Reproductive: Uterus and adnexa normal Other: No peritoneal metastasis. Musculoskeletal: Subtle rounded densities within the marrow space of the LEFT and RIGHT iliac bone measures 1.5 cm each (image 92 and 89 of  series 2). Prior lumbar surgery IMPRESSION: Chest Impression: 1. Bulky LEFT suprahilar mass extends into the LEFT hilum and AP window most consistent with bronchogenic carcinoma. Favor small cell bronchogenic carcinoma. 2. Contralateral nodal metastasis on the mediastinum. 3. LEFT suprahilar mass constricts the LEFT upper lobe bronchus as well as the LEFT main pulmonary artery. Abdomen / Pelvis Impression: 1. Large bilobar hepatic metastasis. 2. Enlarge periportal metastatic lymph nodes. 3. High concern for skeletal metastasis within the thoracic spine and pelvis. These results will be called to the ordering clinician or representative by the Radiologist Assistant, and communication documented in the PACS or zVision Dashboard. Electronically Signed   By: Suzy Bouchard M.D.   On: 01/19/2017 15:12   Mr Jeri Cos IR Contrast  Result Date: 02/04/2017 CLINICAL DATA:  Staging for lung cancer. EXAM: MRI HEAD WITHOUT AND WITH CONTRAST TECHNIQUE: Multiplanar, multiecho pulse sequences of the brain and surrounding structures were obtained without and with intravenous contrast. CONTRAST:  74mL MULTIHANCE GADOBENATE DIMEGLUMINE 529 MG/ML IV SOLN COMPARISON:  None. FINDINGS: Brain: No enhancement or swelling to suggest metastatic disease. Moderate chronic small vessel ischemia in the cerebral white matter. No hydrocephalus, collection, or atrophy. Vascular: Major flow voids and vascular enhancements are preserved. Skull and upper cervical spine: Focal T1 hypointense areas  within the C3 body, posterior dens, left occipital condyle, and left parietal calvarium. Sinuses/Orbits: Negative for mass.  Right cataract resection. IMPRESSION: 1. No evidence of intracranial metastasis. 2. Calvarial, spine, and left occipital metastases. Electronically Signed   By: Monte Fantasia M.D.   On: 02/04/2017 09:10   Ct Abdomen Pelvis W Contrast  Result Date: 01/19/2017 CLINICAL DATA:  Nausea and vomiting for 3 weeks. Elevated liver function tests. Concern for liver metastasis. Abnormal hepatic ultrasound. EXAM: CT CHEST, ABDOMEN, AND PELVIS WITH CONTRAST TECHNIQUE: Multidetector CT imaging of the chest, abdomen and pelvis was performed following the standard protocol during bolus administration of intravenous contrast. CONTRAST:  149mL ISOVUE-300 IOPAMIDOL (ISOVUE-300) INJECTION 61% COMPARISON:  Ultrasound 01/10/2017 FINDINGS: CT CHEST FINDINGS Cardiovascular: Coronary artery calcification and aortic atherosclerotic calcification. LEFT hilar mass in AP window and narrows the LEFT main pulmonary artery. No pericardial effusion Mediastinum/Nodes: No axillary or supraclavicular adenopathy. Bulky LEFT hilar and AP window nodal mass measures 8.8 by 7.5 cm. Adenopathy extends to the RIGHT lower paratracheal nodal station. Subcarinal adenopathy also is present measuring 1.6 cm short axis Lungs/Pleura: LEFT suprahilar mass constricts the LEFT upper lobe bronchus and contiguous with the bulky LEFT hilar and AP window nodal mass described above. Mild obstructive pneumonitis in the LEFT upper lobe. Musculoskeletal: Several smudgy rounded sclerotic densities in the thoracic spine (for example T10 vertebral body level image 36, series 2). CT ABDOMEN AND PELVIS FINDINGS Hepatobiliary: Multiple hypoenhancing lesion in LEFTand RIGHT hepatic lobe. Largest lesion in the segment IVb of the liver measures 6.4 x 7.1 cm. Example lesion in segment 8 of the RIGHT hepatic lobe measures 5.0 x 4.2 cm. Approximately 10  lesions within the liver. No biliary duct dilatation. Portal vein is patent. Pancreas: Pancreas normal. Enlarged lymph node positioned between the pancreas and LEFT hepatic lobe measuring 1.5 cm short axis (image 58, series 2 Spleen: Normal spleen Adrenals/urinary tract: Adrenal glands and kidneys are normal. The ureters and bladder normal. Stomach/Bowel: Stomach, duodenum, small-bowel, cecum are normal. Appendix not identified. Several diverticula of the descending colon sigmoid colon without acute inflammation. Rectum normal Vascular/Lymphatic: Abdominal aorta is calcified but not aneurysmal. Periportal lymph node measured 1.6 cm. No periaortic or pelvic adenopathy  Reproductive: Uterus and adnexa normal Other: No peritoneal metastasis. Musculoskeletal: Subtle rounded densities within the marrow space of the LEFT and RIGHT iliac bone measures 1.5 cm each (image 92 and 89 of series 2). Prior lumbar surgery IMPRESSION: Chest Impression: 1. Bulky LEFT suprahilar mass extends into the LEFT hilum and AP window most consistent with bronchogenic carcinoma. Favor small cell bronchogenic carcinoma. 2. Contralateral nodal metastasis on the mediastinum. 3. LEFT suprahilar mass constricts the LEFT upper lobe bronchus as well as the LEFT main pulmonary artery. Abdomen / Pelvis Impression: 1. Large bilobar hepatic metastasis. 2. Enlarge periportal metastatic lymph nodes. 3. High concern for skeletal metastasis within the thoracic spine and pelvis. These results will be called to the ordering clinician or representative by the Radiologist Assistant, and communication documented in the PACS or zVision Dashboard. Electronically Signed   By: Suzy Bouchard M.D.   On: 01/19/2017 15:12   Nm Pet Image Initial (pi) Skull Base To Thigh  Result Date: 02/06/2017 CLINICAL DATA:  Initial treatment strategy for lung mass. Small cell lung cancer. EXAM: NUCLEAR MEDICINE PET SKULL BASE TO THIGH TECHNIQUE: 13.4 mCi F-18 FDG was injected  intravenously. Full-ring PET imaging was performed from the skull base to thigh after the radiotracer. CT data was obtained and used for attenuation correction and anatomic localization. FASTING BLOOD GLUCOSE:  Value: 123 mg/dl COMPARISON:  CT 02/08/2017 FINDINGS: NECK No hypermetabolic lymph nodes in the neck. CHEST Port in the RIGHT chest. Large LEFT parahilar mass measures 6 cm x 6 cm with SUV max equal 12.7. Mass extends into the AP window. Contralateral hypermetabolic adenopathy in the RIGHT lower paratracheal nodal station as well subcarinal nodal station. No hypermetabolic parenchymal pulmonary nodules. ABDOMEN/PELVIS Large mass lesion in the central LEFT hepatic lobe measures 7.0 x 10.0 cm with SUV max equal 8.2. Similar expansile lesion in the LEFT lateral hepatic lobe measures 5.8 cm with SUV max equals 6.7. Hypermetabolic periportal adenopathy adjacent the pancreatic head. No pancreatic mass lesion identified. Normal adrenal glands.  Normal spleen and kidneys SKELETON: Several discrete hypermetabolic foci within the skeleton consistent metastatic disease. For example lesion in the intertrochanteric RIGHT femur with SUV max equal 4.2. Lesion in the transverse process of the T10 vertebral body on the RIGHT with SUV max equals 6.0. IMPRESSION: 1. Hypermetabolic LEFT hilar mass extending into AP window consistent with small cell lung cancer. 2. Hypermetabolic contralateral mediastinal adenopathy. 3. Large hypermetabolic masses within the LEFT hepatic lobe. 4. Nodal metastasis the upper abdomen at the periportal nodal station. 5. Several discrete hyper meta skeletal metastasis as above. Electronically Signed   By: Suzy Bouchard M.D.   On: 02/06/2017 12:14   US Biopsy (liver)  Result Date: 01/26/2017 CLINICAL DATA:  Large left suprahilar lung mass with multiple liver lesions. The patient presents for biopsy of 1 of the liver masses. EXAM: ULTRASOUND GUIDED CORE BIOPSY OF LIVER MEDICATIONS: 2.0 mg IV  Versed; 50 mcg IV Fentanyl Total Moderate Sedation Time: 12 minutes. The patient's level of consciousness and physiologic status were continuously monitored during the procedure by Radiology nursing. PROCEDURE: The procedure, risks, benefits, and alternatives were explained to the patient. Questions regarding the procedure were encouraged and answered. The patient understands and consents to the procedure. A time out was performed prior to initiating the procedure. Ultrasound was performed to localize liver lesions. The abdominal wall was prepped with chlorhexidine in a sterile fashion, and a sterile drape was applied covering the operative field. A sterile gown and sterile gloves were  used for the procedure. Local anesthesia was provided with 1% Lidocaine. Under ultrasound guidance, a 17 gauge needle was advanced into a lesion within the left lobe of the liver. Coaxial 18 gauge core biopsy samples were obtained. A total of 3 samples were submitted in formalin. After biopsy, a slurry of Gel-Foam pledgets was injected via the outer needle as it was retracted. Additional ultrasound was performed. COMPLICATIONS: None. FINDINGS: Multiple large hypoechoic masses are seen throughout the liver parenchyma. A lesion in the lateral segment of the left lobe measuring approximately 5.2 cm in greatest diameter was chosen for sampling. Solid tissue was obtained. IMPRESSION: Ultrasound-guided core biopsy performed of a 5 cm mass within the lateral segment of the left lobe of the liver. Electronically Signed   By: Aletta Edouard M.D.   On: 01/26/2017 12:25   Dg C-arm 1-60 Min-no Report  Result Date: 01/17/2017 Fluoroscopy was utilized by the requesting physician.  No radiographic interpretation.   US Abdomen Limited Ruq  Result Date: 01/10/2017 CLINICAL DATA:  Elevated LFTs with nausea for 3 weeks EXAM: ULTRASOUND ABDOMEN LIMITED RIGHT UPPER QUADRANT COMPARISON:  CT abdomen and pelvis 02/21/2014 FINDINGS: Gallbladder:  Normally distended without stones or wall thickening. No pericholecystic fluid or sonographic Murphy sign. Common bile duct: Diameter: Normal caliber 5 mm diameter Liver: Parenchymal echogenicity normal. Multiple hepatic masses are identified. A LEFT lobe lesion measures 3.5 x 3.5 x 3.8 cm. Largest lesion in RIGHT lobe measures 6.2 x 5.9 x 6.5 cm. These lesions were not visualized on a prior noncontrast CT from 2016. Findings are highly suspicious for metastatic disease. Portal vein is patent on color Doppler imaging with normal direction of blood flow towards the liver. No RIGHT upper quadrant free fluid. IMPRESSION: Multiple soft tissue masses within the liver up to 6.5 cm in size highly concerning for hepatic metastases. Findings called to Wayland Denis PA on 01/10/2017 at 1045 hrs. Electronically Signed   By: Lavonia Dana M.D.   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  PLAN:  1. 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  MRI of the brain on number 22nd 2018 did not reveal any metastatic disease.  Patient recently had port placement.  Proceed with cycle 1, day 1 of carboplatinum, Tecentriq, and etoposide on day 1 and etoposide only on days 2 and 3.  Patient will also have Neulasta support.  This will be a 21-day cycle with repeat imaging after 4 treatments.  If there is improvement of her disease burden, will then continue Tecentriq maintenance treatment every 3 weeks.  Patient will also receive Zometa on the odd numbered cycles for her bony disease.  Return to clinic in 1 and 2 days for etoposide only, in 1 week for laboratory work and further evaluation, and then in 3 weeks for consideration of cycle 2, day 1.   2.  Elevated liver enzymes: Resolved.  Approximately 30 minutes was spent in discussion  of which greater than 50% was consultation.  Patient expressed understanding and was in agreement with this plan. She also understands that She can call clinic at any time with any questions, concerns, or complaints.   Cancer Staging Small cell lung cancer [facility]) Staging form: Lung, AJCC 8th Edition - Clinical stage from 01/28/2017: Stage IV (cT4, cN3, pM1c) - Signed by Grayland Ormond,  Kathlene November, MD on 01/28/2017   Lloyd Huger, MD   02/08/2017 12:00 PM

## 2017-02-08 ENCOUNTER — Other Ambulatory Visit: Payer: Self-pay

## 2017-02-08 ENCOUNTER — Inpatient Hospital Stay: Payer: Medicare Other

## 2017-02-08 ENCOUNTER — Other Ambulatory Visit: Payer: Self-pay | Admitting: *Deleted

## 2017-02-08 ENCOUNTER — Inpatient Hospital Stay (HOSPITAL_BASED_OUTPATIENT_CLINIC_OR_DEPARTMENT_OTHER): Payer: Medicare Other | Admitting: Oncology

## 2017-02-08 VITALS — BP 91/64 | HR 76 | Temp 96.7°F | Resp 18 | Wt 212.0 lb

## 2017-02-08 DIAGNOSIS — F419 Anxiety disorder, unspecified: Secondary | ICD-10-CM | POA: Diagnosis not present

## 2017-02-08 DIAGNOSIS — J449 Chronic obstructive pulmonary disease, unspecified: Secondary | ICD-10-CM | POA: Diagnosis not present

## 2017-02-08 DIAGNOSIS — I429 Cardiomyopathy, unspecified: Secondary | ICD-10-CM

## 2017-02-08 DIAGNOSIS — E785 Hyperlipidemia, unspecified: Secondary | ICD-10-CM

## 2017-02-08 DIAGNOSIS — C787 Secondary malignant neoplasm of liver and intrahepatic bile duct: Secondary | ICD-10-CM | POA: Diagnosis not present

## 2017-02-08 DIAGNOSIS — Z7689 Persons encountering health services in other specified circumstances: Secondary | ICD-10-CM

## 2017-02-08 DIAGNOSIS — C3490 Malignant neoplasm of unspecified part of unspecified bronchus or lung: Secondary | ICD-10-CM

## 2017-02-08 DIAGNOSIS — Z79899 Other long term (current) drug therapy: Secondary | ICD-10-CM

## 2017-02-08 DIAGNOSIS — Z5111 Encounter for antineoplastic chemotherapy: Secondary | ICD-10-CM | POA: Diagnosis not present

## 2017-02-08 DIAGNOSIS — Z8619 Personal history of other infectious and parasitic diseases: Secondary | ICD-10-CM

## 2017-02-08 DIAGNOSIS — F329 Major depressive disorder, single episode, unspecified: Secondary | ICD-10-CM | POA: Diagnosis not present

## 2017-02-08 DIAGNOSIS — Z5112 Encounter for antineoplastic immunotherapy: Secondary | ICD-10-CM | POA: Diagnosis not present

## 2017-02-08 DIAGNOSIS — I700 Atherosclerosis of aorta: Secondary | ICD-10-CM

## 2017-02-08 DIAGNOSIS — K5730 Diverticulosis of large intestine without perforation or abscess without bleeding: Secondary | ICD-10-CM

## 2017-02-08 DIAGNOSIS — R112 Nausea with vomiting, unspecified: Secondary | ICD-10-CM

## 2017-02-08 DIAGNOSIS — K219 Gastro-esophageal reflux disease without esophagitis: Secondary | ICD-10-CM | POA: Diagnosis not present

## 2017-02-08 DIAGNOSIS — Z87891 Personal history of nicotine dependence: Secondary | ICD-10-CM

## 2017-02-08 DIAGNOSIS — M129 Arthropathy, unspecified: Secondary | ICD-10-CM

## 2017-02-08 DIAGNOSIS — R599 Enlarged lymph nodes, unspecified: Secondary | ICD-10-CM

## 2017-02-08 DIAGNOSIS — C3412 Malignant neoplasm of upper lobe, left bronchus or lung: Secondary | ICD-10-CM | POA: Diagnosis not present

## 2017-02-08 DIAGNOSIS — C7951 Secondary malignant neoplasm of bone: Secondary | ICD-10-CM | POA: Diagnosis not present

## 2017-02-08 DIAGNOSIS — R948 Abnormal results of function studies of other organs and systems: Secondary | ICD-10-CM

## 2017-02-08 DIAGNOSIS — Z8701 Personal history of pneumonia (recurrent): Secondary | ICD-10-CM

## 2017-02-08 DIAGNOSIS — I10 Essential (primary) hypertension: Secondary | ICD-10-CM

## 2017-02-08 DIAGNOSIS — I509 Heart failure, unspecified: Secondary | ICD-10-CM | POA: Diagnosis not present

## 2017-02-08 LAB — CBC WITH DIFFERENTIAL/PLATELET
BASOS ABS: 0.1 10*3/uL (ref 0–0.1)
BASOS PCT: 1 %
EOS PCT: 1 %
Eosinophils Absolute: 0.1 10*3/uL (ref 0–0.7)
HCT: 33.5 % — ABNORMAL LOW (ref 35.0–47.0)
Hemoglobin: 11.2 g/dL — ABNORMAL LOW (ref 12.0–16.0)
LYMPHS PCT: 19 %
Lymphs Abs: 1.4 10*3/uL (ref 1.0–3.6)
MCH: 30.6 pg (ref 26.0–34.0)
MCHC: 33.3 g/dL (ref 32.0–36.0)
MCV: 91.7 fL (ref 80.0–100.0)
Monocytes Absolute: 0.4 10*3/uL (ref 0.2–0.9)
Monocytes Relative: 5 %
Neutro Abs: 5.5 10*3/uL (ref 1.4–6.5)
Neutrophils Relative %: 74 %
PLATELETS: 209 10*3/uL (ref 150–440)
RBC: 3.65 MIL/uL — AB (ref 3.80–5.20)
RDW: 14 % (ref 11.5–14.5)
WBC: 7.5 10*3/uL (ref 3.6–11.0)

## 2017-02-08 LAB — COMPREHENSIVE METABOLIC PANEL
ALBUMIN: 3.3 g/dL — AB (ref 3.5–5.0)
ALK PHOS: 68 U/L (ref 38–126)
ALT: 27 U/L (ref 14–54)
ANION GAP: 9 (ref 5–15)
AST: 37 U/L (ref 15–41)
BILIRUBIN TOTAL: 0.5 mg/dL (ref 0.3–1.2)
BUN: 14 mg/dL (ref 6–20)
CALCIUM: 8.6 mg/dL — AB (ref 8.9–10.3)
CO2: 27 mmol/L (ref 22–32)
Chloride: 101 mmol/L (ref 101–111)
Creatinine, Ser: 0.71 mg/dL (ref 0.44–1.00)
GFR calc Af Amer: >60 mL/min (ref >60)
Glucose, Bld: 118 mg/dL — ABNORMAL HIGH (ref 65–99)
POTASSIUM: 3.2 mmol/L — AB (ref 3.5–5.1)
SODIUM: 137 mmol/L (ref 135–145)
TOTAL PROTEIN: 6.8 g/dL (ref 6.5–8.1)

## 2017-02-08 MED ORDER — SODIUM CHLORIDE 0.9 % IV SOLN: 10.0000 mg | Freq: Once | INTRAVENOUS | Status: DC

## 2017-02-08 MED ORDER — SODIUM CHLORIDE 0.9% FLUSH
10.0000 mL | Freq: Once | INTRAVENOUS | Status: AC
  Administered 2017-02-08: 10 mL via INTRAVENOUS
  Filled 2017-02-08: qty 10

## 2017-02-08 MED ORDER — SODIUM CHLORIDE 0.9 % IV SOLN
Freq: Once | INTRAVENOUS | Status: AC
  Administered 2017-02-08: 11:00:00 via INTRAVENOUS
  Filled 2017-02-08: qty 1000

## 2017-02-08 MED ORDER — SODIUM CHLORIDE 0.9 % IV SOLN
200.0000 mg | Freq: Once | INTRAVENOUS | Status: AC
  Administered 2017-02-08: 200 mg via INTRAVENOUS
  Filled 2017-02-08: qty 10

## 2017-02-08 MED ORDER — OXYCODONE-ACETAMINOPHEN 5-325 MG PO TABS: 1.0000 | ORAL_TABLET | Freq: Four times a day (QID) | ORAL | 0 refills | Status: DC | PRN

## 2017-02-08 MED ORDER — SODIUM CHLORIDE 0.9 % IV SOLN: 100.0000 mg/m2 | Freq: Once | INTRAVENOUS | Status: DC

## 2017-02-08 MED ORDER — SODIUM CHLORIDE 0.9 % IV SOLN
551.0000 mg | Freq: Once | INTRAVENOUS | Status: AC
  Administered 2017-02-08: 550 mg via INTRAVENOUS
  Filled 2017-02-08: qty 55

## 2017-02-08 MED ORDER — ZOLEDRONIC ACID 4 MG/100ML IV SOLN
4.0000 mg | Freq: Once | INTRAVENOUS | Status: AC
  Administered 2017-02-08: 4 mg via INTRAVENOUS
  Filled 2017-02-08: qty 100

## 2017-02-08 MED ORDER — PALONOSETRON HCL INJECTION 0.25 MG/5ML
0.2500 mg | Freq: Once | INTRAVENOUS | Status: AC
  Administered 2017-02-08: 0.25 mg via INTRAVENOUS
  Filled 2017-02-08: qty 5

## 2017-02-08 MED ORDER — DEXAMETHASONE SODIUM PHOSPHATE 10 MG/ML IJ SOLN
10.0000 mg | Freq: Once | INTRAMUSCULAR | Status: AC
  Administered 2017-02-08: 10 mg via INTRAVENOUS
  Filled 2017-02-08: qty 1

## 2017-02-08 MED ORDER — SODIUM CHLORIDE 0.9 % IV SOLN
1200.0000 mg | Freq: Once | INTRAVENOUS | Status: AC
  Administered 2017-02-08: 1200 mg via INTRAVENOUS
  Filled 2017-02-08: qty 20

## 2017-02-08 MED ORDER — HEPARIN SOD (PORK) LOCK FLUSH 100 UNIT/ML IV SOLN
500.0000 [IU] | Freq: Once | INTRAVENOUS | Status: AC
  Administered 2017-02-08: 500 [IU] via INTRAVENOUS
  Filled 2017-02-08: qty 5

## 2017-02-09 ENCOUNTER — Inpatient Hospital Stay: Payer: Medicare Other

## 2017-02-09 VITALS — BP 103/74 | HR 76 | Temp 97.3°F | Resp 20

## 2017-02-09 DIAGNOSIS — Z5112 Encounter for antineoplastic immunotherapy: Secondary | ICD-10-CM | POA: Diagnosis not present

## 2017-02-09 DIAGNOSIS — C3412 Malignant neoplasm of upper lobe, left bronchus or lung: Secondary | ICD-10-CM | POA: Diagnosis not present

## 2017-02-09 DIAGNOSIS — R110 Nausea: Secondary | ICD-10-CM

## 2017-02-09 DIAGNOSIS — Z7689 Persons encountering health services in other specified circumstances: Secondary | ICD-10-CM | POA: Diagnosis not present

## 2017-02-09 DIAGNOSIS — Z5111 Encounter for antineoplastic chemotherapy: Secondary | ICD-10-CM | POA: Diagnosis not present

## 2017-02-09 DIAGNOSIS — C787 Secondary malignant neoplasm of liver and intrahepatic bile duct: Secondary | ICD-10-CM | POA: Diagnosis not present

## 2017-02-09 DIAGNOSIS — C3490 Malignant neoplasm of unspecified part of unspecified bronchus or lung: Secondary | ICD-10-CM

## 2017-02-09 DIAGNOSIS — C7951 Secondary malignant neoplasm of bone: Secondary | ICD-10-CM | POA: Diagnosis not present

## 2017-02-09 MED ORDER — SODIUM CHLORIDE 0.9% FLUSH
10.0000 mL | INTRAVENOUS | Status: DC | PRN
  Administered 2017-02-09: 10 mL
  Filled 2017-02-09: qty 10

## 2017-02-09 MED ORDER — DEXAMETHASONE SODIUM PHOSPHATE 10 MG/ML IJ SOLN
10.0000 mg | Freq: Once | INTRAMUSCULAR | Status: AC
  Administered 2017-02-09: 10 mg via INTRAVENOUS
  Filled 2017-02-09: qty 1

## 2017-02-09 MED ORDER — SODIUM CHLORIDE 0.9 % IV SOLN
Freq: Once | INTRAVENOUS | Status: AC
  Administered 2017-02-09: 09:00:00 via INTRAVENOUS
  Filled 2017-02-09: qty 1000

## 2017-02-09 MED ORDER — PROCHLORPERAZINE MALEATE 10 MG PO TABS
10.0000 mg | ORAL_TABLET | Freq: Once | ORAL | Status: AC
  Administered 2017-02-09: 10 mg via ORAL
  Filled 2017-02-09: qty 1

## 2017-02-09 MED ORDER — HEPARIN SOD (PORK) LOCK FLUSH 100 UNIT/ML IV SOLN
500.0000 [IU] | Freq: Once | INTRAVENOUS | Status: AC | PRN
  Administered 2017-02-09: 500 [IU]
  Filled 2017-02-09: qty 5

## 2017-02-09 MED ORDER — SODIUM CHLORIDE 0.9 % IV SOLN
200.0000 mg | Freq: Once | INTRAVENOUS | Status: AC
  Administered 2017-02-09: 200 mg via INTRAVENOUS
  Filled 2017-02-09: qty 10

## 2017-02-10 ENCOUNTER — Inpatient Hospital Stay: Payer: Medicare Other

## 2017-02-10 VITALS — BP 98/65 | HR 71 | Temp 98.6°F | Resp 20

## 2017-02-10 DIAGNOSIS — C7951 Secondary malignant neoplasm of bone: Secondary | ICD-10-CM | POA: Diagnosis not present

## 2017-02-10 DIAGNOSIS — C3412 Malignant neoplasm of upper lobe, left bronchus or lung: Secondary | ICD-10-CM | POA: Diagnosis not present

## 2017-02-10 DIAGNOSIS — C3490 Malignant neoplasm of unspecified part of unspecified bronchus or lung: Secondary | ICD-10-CM

## 2017-02-10 DIAGNOSIS — C787 Secondary malignant neoplasm of liver and intrahepatic bile duct: Secondary | ICD-10-CM | POA: Diagnosis not present

## 2017-02-10 DIAGNOSIS — Z5112 Encounter for antineoplastic immunotherapy: Secondary | ICD-10-CM | POA: Diagnosis not present

## 2017-02-10 DIAGNOSIS — Z7689 Persons encountering health services in other specified circumstances: Secondary | ICD-10-CM | POA: Diagnosis not present

## 2017-02-10 DIAGNOSIS — Z5111 Encounter for antineoplastic chemotherapy: Secondary | ICD-10-CM | POA: Diagnosis not present

## 2017-02-10 MED ORDER — DEXAMETHASONE SODIUM PHOSPHATE 10 MG/ML IJ SOLN
10.0000 mg | Freq: Once | INTRAMUSCULAR | Status: AC
  Administered 2017-02-10: 10 mg via INTRAVENOUS
  Filled 2017-02-10: qty 1

## 2017-02-10 MED ORDER — SODIUM CHLORIDE 0.9 % IV SOLN: 10.0000 mg | Freq: Once | INTRAVENOUS | Status: DC

## 2017-02-10 MED ORDER — SODIUM CHLORIDE 0.9 % IV SOLN
Freq: Once | INTRAVENOUS | Status: AC
  Administered 2017-02-10: 09:00:00 via INTRAVENOUS
  Filled 2017-02-10: qty 1000

## 2017-02-10 MED ORDER — SODIUM CHLORIDE 0.9 % IV SOLN
200.0000 mg | Freq: Once | INTRAVENOUS | Status: AC
  Administered 2017-02-10: 200 mg via INTRAVENOUS
  Filled 2017-02-10: qty 10

## 2017-02-10 MED ORDER — PEGFILGRASTIM 6 MG/0.6ML ~~LOC~~ PSKT
6.0000 mg | PREFILLED_SYRINGE | Freq: Once | SUBCUTANEOUS | Status: AC
  Administered 2017-02-10: 6 mg via SUBCUTANEOUS
  Filled 2017-02-10: qty 0.6

## 2017-02-10 MED ORDER — HEPARIN SOD (PORK) LOCK FLUSH 100 UNIT/ML IV SOLN
500.0000 [IU] | Freq: Once | INTRAVENOUS | Status: AC | PRN
  Administered 2017-02-10: 500 [IU]
  Filled 2017-02-10: qty 5

## 2017-02-13 NOTE — Progress Notes (Addendum)
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  INTERVAL HISTORY: Patient returns to clinic today for further evaluation and to assess her toleration of cycle 1, day 1 of carboplatinum, etoposide, and Tecentriq.  She tolerated her treatment well with only some mild nausea which was controlled by her prescription medications.  She has chronic pain and is seen in the pain clinic on a regular basis, but otherwise feels well.  She has no neurologic complaints.  She denies any recent fevers or illnesses. She has a good appetite and denies weight loss.  She has no chest pain, cough, hemoptysis, or shortness of breath.  She denies any nausea, vomiting, constipation, or diarrhea.  She has no abdominal pain.  She denies any melena or hematochezia.  She has no urinary complaints.  Patient offers no further specific complaints today.  REVIEW OF SYSTEMS:   Review of Systems  Constitutional: Negative.  Negative for fever, malaise/fatigue and weight loss.  Respiratory: Negative.  Negative for cough, hemoptysis and shortness of breath.   Cardiovascular: Negative.  Negative for chest pain and leg swelling.  Gastrointestinal: Negative.  Negative for abdominal pain, blood in stool and melena.  Genitourinary: Negative.   Musculoskeletal: Positive for back pain, joint pain and neck pain.  Skin: Negative.  Negative for rash.  Neurological: Negative.  Negative for sensory change and weakness.  Psychiatric/Behavioral: Negative.  The patient is not nervous/anxious.     As per HPI. Otherwise, a complete review of systems is negative.  PAST MEDICAL HISTORY: Past Medical History:  Diagnosis Date  . Anxiety   . Arthritis    . Asthma    COPD  . Cancer ([place])    Liver  . CAP (community acquired pneumonia) 06/30/2014  . Cardiomyopathy ([place])    nonischemic (EF 35-40%)  . CHF (congestive heart failure) ([place])   . COPD (chronic obstructive pulmonary disease) ([place])   . Depression   . Dyspnea   . GERD (gastroesophageal reflux disease)   . Hyperlipidemia   . Hypertension   . Pneumonia   . PVC's (premature ventricular contractions)    states 10 years ago  . Sepsis ([place]) 06/30/2014  . SOB (shortness of breath) 05/08/2014  . Spinal cord injury at T7-T12 level [facility])    2016    PAST SURGICAL HISTORY: Past Surgical History:  Procedure Laterality Date  . APPENDECTOMY    . CARDIAC CATHETERIZATION    . CATARACT EXTRACTION     right eye   . CESAREAN SECTION    . COLONOSCOPY    . LAMINOTOMY    . LUMBAR LAMINECTOMY/DECOMPRESSION MICRODISCECTOMY Left 08/01/2016   Procedure: Left Lumbar Four-Five Laminectomy and Foraminotomy;  Surgeon: Earnie Larsson, MD;  Location: [place];  Service: Neurosurgery;  Laterality: Left;  . PORTA CATH INSERTION N/A 02/03/2017   Procedure: PORTA CATH INSERTION;  Surgeon: Katha Cabal, MD;  Location: [place] [facility];  Service: Cardiovascular;  Laterality: N/A;    FAMILY HISTORY: Family History  Problem Relation Age of Onset  . Heart attack Father 48       MI x 3     ADVANCED DIRECTIVES (Y/N):  N  HEALTH MAINTENANCE: Social History   Tobacco Use  . Smoking status: Former  Smoker    Packs/day: 0.50    Years: 50.00    Pack years: 25.00    Types: Cigarettes    Last attempt to quit: 01/27/2015    Years since quitting: 2.0  . Smokeless tobacco: Never Used  Substance Use Topics  . Alcohol use: No  . Drug use: No     Colonoscopy:  PAP:  Bone density:  Lipid panel:  Allergies  Allergen Reactions  . Iodine Anaphylaxis    IVP dye  . Fentanyl     Overly sedated and groggy, ineffective     Current Outpatient Medications  Medication Sig Dispense Refill  . albuterol  (PROAIR HFA) 108 (90 Base) MCG/ACT inhaler Inhale 2 puffs into the lungs every 6 (six) hours as needed for wheezing or shortness of breath.     Marland Kitchen alendronate (FOSAMAX) 70 MG tablet Take 70 mg by mouth every Thursday.     . ARIPiprazole (ABILIFY) 5 MG tablet Take 5 mg by mouth daily.    . Calcium Citrate-Vitamin D (CALCIUM + D PO) Take 1 tablet by mouth 2 (two) times daily.    . DULoxetine (CYMBALTA) 60 MG capsule Take 60 mg by mouth daily.    . fentaNYL (DURAGESIC - DOSED MCG/HR) 25 MCG/HR patch Place 1 patch (25 mcg total) onto the skin every 3 (three) days. 10 patch 0  . lidocaine-prilocaine (EMLA) cream Apply to affected area once 30 g 3  . LORazepam (ATIVAN) 0.5 MG tablet Take 0.5 mg by mouth daily as needed for anxiety.     . metoprolol succinate (TOPROL-XL) 100 MG 24 hr tablet TAKE ONE TABLET BY MOUTH ONCE DAILY 30 tablet 0  . Omega-3 Fatty Acids (FISH OIL) 1000 MG CAPS Take 1,000 mg by mouth daily.    . ondansetron (ZOFRAN) 8 MG tablet Take 1 tablet (8 mg total) by mouth 2 (two) times daily as needed for refractory nausea / vomiting. 30 tablet 2  . oxyCODONE (OXYCONTIN) 20 mg 12 hr tablet Take 1 tablet (20 mg total) by mouth every 12 (twelve) hours. 60 tablet 0  . oxyCODONE-acetaminophen (PERCOCET/ROXICET) 5-325 MG tablet Take 1-2 tablets by mouth every 6 (six) hours as needed. For pain. 120 tablet 0  . prochlorperazine (COMPAZINE) 10 MG tablet Take 1 tablet (10 mg total) by mouth every 6 (six) hours as needed (Nausea or vomiting). 60 tablet 2  . promethazine (PHENERGAN) 25 MG tablet Take 1 tablet (25 mg total) by mouth every 6 (six) hours as needed for nausea or vomiting. 30 tablet 2  . sacubitril-valsartan (ENTRESTO) 49-51 MG Take 1 tablet by mouth 2 (two) times daily. 60 tablet 6   No current facility-administered medications for this visit.     OBJECTIVE: Vitals:   02/15/17 0922  BP: 106/74  Pulse: 97  Resp: 18  Temp: 98.6 F (37 C)     Body mass index is 35.28 kg/m.    ECOG  FS:1 - Symptomatic but completely ambulatory  General: Well-developed, well-nourished, no acute distress.  Sitting in a wheelchair. Eyes: Pink conjunctiva, anicteric sclera. Lungs: Clear to auscultation bilaterally. Heart: Regular rate and rhythm. No rubs, murmurs, or gallops. Abdomen: Soft, nontender, nondistended. No organomegaly noted, normoactive bowel sounds. Musculoskeletal: No edema, cyanosis, or clubbing. Neuro: Alert, answering all questions appropriately. Cranial nerves grossly intact. Skin: No rashes or petechiae noted. Psych: Normal affect.   LAB RESULTS:  Lab Results  Component Value Date   NA 138 02/15/2017   K 3.9 02/15/2017   CL 102  02/15/2017   CO2 26 02/15/2017   GLUCOSE 126 (H) 02/15/2017   BUN 16 02/15/2017   CREATININE 0.68 02/15/2017   CALCIUM 8.0 (L) 02/15/2017   PROT 6.8 02/15/2017   ALBUMIN 3.7 02/15/2017   AST 45 (H) 02/15/2017   ALT 27 02/15/2017   ALKPHOS 68 02/15/2017   BILITOT 0.8 02/15/2017   GFRNONAA >60 02/15/2017   GFRAA >60 02/15/2017    Lab Results  Component Value Date   WBC 1.8 (L) 02/15/2017   NEUTROABS 1.0 (L) 02/15/2017   HGB 10.5 (L) 02/15/2017   HCT 31.3 (L) 02/15/2017   MCV 92.6 02/15/2017   PLT 106 (L) 02/15/2017     STUDIES: Ct Chest W Contrast  Result Date: 01/19/2017 CLINICAL DATA:  Nausea and vomiting for 3 weeks. Elevated liver function tests. Concern for liver metastasis. Abnormal hepatic ultrasound. EXAM: CT CHEST, ABDOMEN, AND PELVIS WITH CONTRAST TECHNIQUE: Multidetector CT imaging of the chest, abdomen and pelvis was performed following the standard protocol during bolus administration of intravenous contrast. CONTRAST:  161mL ISOVUE-300 IOPAMIDOL (ISOVUE-300) INJECTION 61% COMPARISON:  Ultrasound 01/10/2017 FINDINGS: CT CHEST FINDINGS Cardiovascular: Coronary artery calcification and aortic atherosclerotic calcification. LEFT hilar mass in AP window and narrows the LEFT main pulmonary artery. No pericardial  effusion Mediastinum/Nodes: No axillary or supraclavicular adenopathy. Bulky LEFT hilar and AP window nodal mass measures 8.8 by 7.5 cm. Adenopathy extends to the RIGHT lower paratracheal nodal station. Subcarinal adenopathy also is present measuring 1.6 cm short axis Lungs/Pleura: LEFT suprahilar mass constricts the LEFT upper lobe bronchus and contiguous with the bulky LEFT hilar and AP window nodal mass described above. Mild obstructive pneumonitis in the LEFT upper lobe. Musculoskeletal: Several smudgy rounded sclerotic densities in the thoracic spine (for example T10 vertebral body level image 36, series 2). CT ABDOMEN AND PELVIS FINDINGS Hepatobiliary: Multiple hypoenhancing lesion in LEFTand RIGHT hepatic lobe. Largest lesion in the segment IVb of the liver measures 6.4 x 7.1 cm. Example lesion in segment 8 of the RIGHT hepatic lobe measures 5.0 x 4.2 cm. Approximately 10 lesions within the liver. No biliary duct dilatation. Portal vein is patent. Pancreas: Pancreas normal. Enlarged lymph node positioned between the pancreas and LEFT hepatic lobe measuring 1.5 cm short axis (image 58, series 2 Spleen: Normal spleen Adrenals/urinary tract: Adrenal glands and kidneys are normal. The ureters and bladder normal. Stomach/Bowel: Stomach, duodenum, small-bowel, cecum are normal. Appendix not identified. Several diverticula of the descending colon sigmoid colon without acute inflammation. Rectum normal Vascular/Lymphatic: Abdominal aorta is calcified but not aneurysmal. Periportal lymph node measured 1.6 cm. No periaortic or pelvic adenopathy Reproductive: Uterus and adnexa normal Other: No peritoneal metastasis. Musculoskeletal: Subtle rounded densities within the marrow space of the LEFT and RIGHT iliac bone measures 1.5 cm each (image 92 and 89 of series 2). Prior lumbar surgery IMPRESSION: Chest Impression: 1. Bulky LEFT suprahilar mass extends into the LEFT hilum and AP window most consistent with bronchogenic  carcinoma. Favor small cell bronchogenic carcinoma. 2. Contralateral nodal metastasis on the mediastinum. 3. LEFT suprahilar mass constricts the LEFT upper lobe bronchus as well as the LEFT main pulmonary artery. Abdomen / Pelvis Impression: 1. Large bilobar hepatic metastasis. 2. Enlarge periportal metastatic lymph nodes. 3. High concern for skeletal metastasis within the thoracic spine and pelvis. These results will be called to the ordering clinician or representative by the Radiologist Assistant, and communication documented in the PACS or zVision Dashboard. Electronically Signed   By: Suzy Bouchard M.D.   On: 01/19/2017 15:12  Mr Jeri Cos Wo Contrast  Result Date: 02/04/2017 CLINICAL DATA:  Staging for lung cancer. EXAM: MRI HEAD WITHOUT AND WITH CONTRAST TECHNIQUE: Multiplanar, multiecho pulse sequences of the brain and surrounding structures were obtained without and with intravenous contrast. CONTRAST:  51mL MULTIHANCE GADOBENATE DIMEGLUMINE 529 MG/ML IV SOLN COMPARISON:  None. FINDINGS: Brain: No enhancement or swelling to suggest metastatic disease. Moderate chronic small vessel ischemia in the cerebral white matter. No hydrocephalus, collection, or atrophy. Vascular: Major flow voids and vascular enhancements are preserved. Skull and upper cervical spine: Focal T1 hypointense areas within the C3 body, posterior dens, left occipital condyle, and left parietal calvarium. Sinuses/Orbits: Negative for mass.  Right cataract resection. IMPRESSION: 1. No evidence of intracranial metastasis. 2. Calvarial, spine, and left occipital metastases. Electronically Signed   By: Monte Fantasia M.D.   On: 02/04/2017 09:10   Ct Abdomen Pelvis W Contrast  Result Date: 01/19/2017 CLINICAL DATA:  Nausea and vomiting for 3 weeks. Elevated liver function tests. Concern for liver metastasis. Abnormal hepatic ultrasound. EXAM: CT CHEST, ABDOMEN, AND PELVIS WITH CONTRAST TECHNIQUE: Multidetector CT imaging of the  chest, abdomen and pelvis was performed following the standard protocol during bolus administration of intravenous contrast. CONTRAST:  192mL ISOVUE-300 IOPAMIDOL (ISOVUE-300) INJECTION 61% COMPARISON:  Ultrasound 01/10/2017 FINDINGS: CT CHEST FINDINGS Cardiovascular: Coronary artery calcification and aortic atherosclerotic calcification. LEFT hilar mass in AP window and narrows the LEFT main pulmonary artery. No pericardial effusion Mediastinum/Nodes: No axillary or supraclavicular adenopathy. Bulky LEFT hilar and AP window nodal mass measures 8.8 by 7.5 cm. Adenopathy extends to the RIGHT lower paratracheal nodal station. Subcarinal adenopathy also is present measuring 1.6 cm short axis Lungs/Pleura: LEFT suprahilar mass constricts the LEFT upper lobe bronchus and contiguous with the bulky LEFT hilar and AP window nodal mass described above. Mild obstructive pneumonitis in the LEFT upper lobe. Musculoskeletal: Several smudgy rounded sclerotic densities in the thoracic spine (for example T10 vertebral body level image 36, series 2). CT ABDOMEN AND PELVIS FINDINGS Hepatobiliary: Multiple hypoenhancing lesion in LEFTand RIGHT hepatic lobe. Largest lesion in the segment IVb of the liver measures 6.4 x 7.1 cm. Example lesion in segment 8 of the RIGHT hepatic lobe measures 5.0 x 4.2 cm. Approximately 10 lesions within the liver. No biliary duct dilatation. Portal vein is patent. Pancreas: Pancreas normal. Enlarged lymph node positioned between the pancreas and LEFT hepatic lobe measuring 1.5 cm short axis (image 58, series 2 Spleen: Normal spleen Adrenals/urinary tract: Adrenal glands and kidneys are normal. The ureters and bladder normal. Stomach/Bowel: Stomach, duodenum, small-bowel, cecum are normal. Appendix not identified. Several diverticula of the descending colon sigmoid colon without acute inflammation. Rectum normal Vascular/Lymphatic: Abdominal aorta is calcified but not aneurysmal. Periportal lymph node  measured 1.6 cm. No periaortic or pelvic adenopathy Reproductive: Uterus and adnexa normal Other: No peritoneal metastasis. Musculoskeletal: Subtle rounded densities within the marrow space of the LEFT and RIGHT iliac bone measures 1.5 cm each (image 92 and 89 of series 2). Prior lumbar surgery IMPRESSION: Chest Impression: 1. Bulky LEFT suprahilar mass extends into the LEFT hilum and AP window most consistent with bronchogenic carcinoma. Favor small cell bronchogenic carcinoma. 2. Contralateral nodal metastasis on the mediastinum. 3. LEFT suprahilar mass constricts the LEFT upper lobe bronchus as well as the LEFT main pulmonary artery. Abdomen / Pelvis Impression: 1. Large bilobar hepatic metastasis. 2. Enlarge periportal metastatic lymph nodes. 3. High concern for skeletal metastasis within the thoracic spine and pelvis. These results will be called to the  ordering clinician or representative by the Radiologist Assistant, and communication documented in the PACS or zVision Dashboard. Electronically Signed   By: Suzy Bouchard M.D.   On: 01/19/2017 15:12   Nm Pet Image Initial (pi) Skull Base To Thigh  Result Date: 02/06/2017 CLINICAL DATA:  Initial treatment strategy for lung mass. Small cell lung cancer. EXAM: NUCLEAR MEDICINE PET SKULL BASE TO THIGH TECHNIQUE: 13.4 mCi F-18 FDG was injected intravenously. Full-ring PET imaging was performed from the skull base to thigh after the radiotracer. CT data was obtained and used for attenuation correction and anatomic localization. FASTING BLOOD GLUCOSE:  Value: 123 mg/dl COMPARISON:  CT 02/08/2017 FINDINGS: NECK No hypermetabolic lymph nodes in the neck. CHEST Port in the RIGHT chest. Large LEFT parahilar mass measures 6 cm x 6 cm with SUV max equal 12.7. Mass extends into the AP window. Contralateral hypermetabolic adenopathy in the RIGHT lower paratracheal nodal station as well subcarinal nodal station. No hypermetabolic parenchymal pulmonary nodules.  ABDOMEN/PELVIS Large mass lesion in the central LEFT hepatic lobe measures 7.0 x 10.0 cm with SUV max equal 8.2. Similar expansile lesion in the LEFT lateral hepatic lobe measures 5.8 cm with SUV max equals 6.7. Hypermetabolic periportal adenopathy adjacent the pancreatic head. No pancreatic mass lesion identified. Normal adrenal glands.  Normal spleen and kidneys SKELETON: Several discrete hypermetabolic foci within the skeleton consistent metastatic disease. For example lesion in the intertrochanteric RIGHT femur with SUV max equal 4.2. Lesion in the transverse process of the T10 vertebral body on the RIGHT with SUV max equals 6.0. IMPRESSION: 1. Hypermetabolic LEFT hilar mass extending into AP window consistent with small cell lung cancer. 2. Hypermetabolic contralateral mediastinal adenopathy. 3. Large hypermetabolic masses within the LEFT hepatic lobe. 4. Nodal metastasis the upper abdomen at the periportal nodal station. 5. Several discrete hyper meta skeletal metastasis as above. Electronically Signed   By: Suzy Bouchard M.D.   On: 02/06/2017 12:14   US Biopsy (liver)  Result Date: 01/26/2017 CLINICAL DATA:  Large left suprahilar lung mass with multiple liver lesions. The patient presents for biopsy of 1 of the liver masses. EXAM: ULTRASOUND GUIDED CORE BIOPSY OF LIVER MEDICATIONS: 2.0 mg IV Versed; 50 mcg IV Fentanyl Total Moderate Sedation Time: 12 minutes. The patient's level of consciousness and physiologic status were continuously monitored during the procedure by Radiology nursing. PROCEDURE: The procedure, risks, benefits, and alternatives were explained to the patient. Questions regarding the procedure were encouraged and answered. The patient understands and consents to the procedure. A time out was performed prior to initiating the procedure. Ultrasound was performed to localize liver lesions. The abdominal wall was prepped with chlorhexidine in a sterile fashion, and a sterile drape was  applied covering the operative field. A sterile gown and sterile gloves were used for the procedure. Local anesthesia was provided with 1% Lidocaine. Under ultrasound guidance, a 17 gauge needle was advanced into a lesion within the left lobe of the liver. Coaxial 18 gauge core biopsy samples were obtained. A total of 3 samples were submitted in formalin. After biopsy, a slurry of Gel-Foam pledgets was injected via the outer needle as it was retracted. Additional ultrasound was performed. COMPLICATIONS: None. FINDINGS: Multiple large hypoechoic masses are seen throughout the liver parenchyma. A lesion in the lateral segment of the left lobe measuring approximately 5.2 cm in greatest diameter was chosen for sampling. Solid tissue was obtained. IMPRESSION: Ultrasound-guided core biopsy performed of a 5 cm mass within the lateral segment of the left  lobe of the liver. Electronically Signed   By: Aletta Edouard M.D.   On: 01/26/2017 12:25   Dg C-arm 1-60 Min-no Report  Result Date: 01/17/2017 Fluoroscopy was utilized by the requesting physician.  No radiographic interpretation.    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  PLAN:  1. 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  MRI of the brain on February 04, 2017 did not reveal any metastatic disease.  Patient tolerated cycle 1, day 1 of carboplatinum, Tecentriq, and etoposide on day 1 and etoposide only on days 2 and 3 last week without significant side effects.  Plan to do 4 treatments with Neulasta support every 21 days and then reimage with PET scan. If there is improvement of her disease burden, will then continue Tecentriq maintenance treatment every 3 weeks.  Patient will also receive Zometa on the odd numbered cycles for her bony disease.  Return to clinic in 2 weeks for  consideration of cycle 2, day 1.   2.  Elevated liver enzymes: Resolved. 3.  Pain: Patient states long-acting narcotics are too expensive and she only takes Percocet.  Had a lengthy discussion with the patient about adjusting her narcotic use. 4.  Pancytopenia: Secondary to chemotherapy, monitor.  Approximately 30 minutes was spent in discussion of which greater than 50% was consultation.  Patient expressed understanding and was in agreement with this plan. She also understands that She can call clinic at any time with any questions, concerns, or complaints.   Cancer Staging Small cell lung cancer [facility]) Staging form: Lung, AJCC 8th Edition - Clinical stage from 01/28/2017: Stage IV (cT4, cN3, pM1c) - Signed by Lloyd Huger, MD on 01/28/2017   Lloyd Huger, MD   02/15/2017 10:12 AM

## 2017-02-15 ENCOUNTER — Inpatient Hospital Stay: Payer: Medicare Other | Attending: Oncology

## 2017-02-15 ENCOUNTER — Other Ambulatory Visit: Payer: Medicare Other

## 2017-02-15 ENCOUNTER — Inpatient Hospital Stay (HOSPITAL_BASED_OUTPATIENT_CLINIC_OR_DEPARTMENT_OTHER): Payer: Medicare Other | Admitting: Oncology

## 2017-02-15 ENCOUNTER — Ambulatory Visit: Payer: Medicare Other | Admitting: Oncology

## 2017-02-15 VITALS — BP 106/74 | HR 97 | Temp 98.6°F | Resp 18 | Wt 212.0 lb

## 2017-02-15 DIAGNOSIS — D61818 Other pancytopenia: Secondary | ICD-10-CM

## 2017-02-15 DIAGNOSIS — Z7689 Persons encountering health services in other specified circumstances: Secondary | ICD-10-CM | POA: Insufficient documentation

## 2017-02-15 DIAGNOSIS — J189 Pneumonia, unspecified organism: Secondary | ICD-10-CM

## 2017-02-15 DIAGNOSIS — I429 Cardiomyopathy, unspecified: Secondary | ICD-10-CM | POA: Diagnosis not present

## 2017-02-15 DIAGNOSIS — R0602 Shortness of breath: Secondary | ICD-10-CM

## 2017-02-15 DIAGNOSIS — Z8701 Personal history of pneumonia (recurrent): Secondary | ICD-10-CM

## 2017-02-15 DIAGNOSIS — F329 Major depressive disorder, single episode, unspecified: Secondary | ICD-10-CM | POA: Diagnosis not present

## 2017-02-15 DIAGNOSIS — I10 Essential (primary) hypertension: Secondary | ICD-10-CM

## 2017-02-15 DIAGNOSIS — J441 Chronic obstructive pulmonary disease with (acute) exacerbation: Secondary | ICD-10-CM | POA: Diagnosis not present

## 2017-02-15 DIAGNOSIS — M549 Dorsalgia, unspecified: Secondary | ICD-10-CM | POA: Diagnosis not present

## 2017-02-15 DIAGNOSIS — J449 Chronic obstructive pulmonary disease, unspecified: Secondary | ICD-10-CM | POA: Insufficient documentation

## 2017-02-15 DIAGNOSIS — C3490 Malignant neoplasm of unspecified part of unspecified bronchus or lung: Secondary | ICD-10-CM

## 2017-02-15 DIAGNOSIS — C787 Secondary malignant neoplasm of liver and intrahepatic bile duct: Secondary | ICD-10-CM

## 2017-02-15 DIAGNOSIS — I700 Atherosclerosis of aorta: Secondary | ICD-10-CM | POA: Diagnosis not present

## 2017-02-15 DIAGNOSIS — K219 Gastro-esophageal reflux disease without esophagitis: Secondary | ICD-10-CM

## 2017-02-15 DIAGNOSIS — Z79899 Other long term (current) drug therapy: Secondary | ICD-10-CM

## 2017-02-15 DIAGNOSIS — R599 Enlarged lymph nodes, unspecified: Secondary | ICD-10-CM

## 2017-02-15 DIAGNOSIS — M2550 Pain in unspecified joint: Secondary | ICD-10-CM | POA: Insufficient documentation

## 2017-02-15 DIAGNOSIS — J45901 Unspecified asthma with (acute) exacerbation: Secondary | ICD-10-CM | POA: Diagnosis not present

## 2017-02-15 DIAGNOSIS — Z5111 Encounter for antineoplastic chemotherapy: Secondary | ICD-10-CM | POA: Diagnosis not present

## 2017-02-15 DIAGNOSIS — F419 Anxiety disorder, unspecified: Secondary | ICD-10-CM | POA: Diagnosis not present

## 2017-02-15 DIAGNOSIS — K5730 Diverticulosis of large intestine without perforation or abscess without bleeding: Secondary | ICD-10-CM | POA: Insufficient documentation

## 2017-02-15 DIAGNOSIS — M542 Cervicalgia: Secondary | ICD-10-CM

## 2017-02-15 DIAGNOSIS — Z5112 Encounter for antineoplastic immunotherapy: Secondary | ICD-10-CM | POA: Diagnosis not present

## 2017-02-15 DIAGNOSIS — Z87891 Personal history of nicotine dependence: Secondary | ICD-10-CM

## 2017-02-15 DIAGNOSIS — Z9049 Acquired absence of other specified parts of digestive tract: Secondary | ICD-10-CM

## 2017-02-15 DIAGNOSIS — R112 Nausea with vomiting, unspecified: Secondary | ICD-10-CM | POA: Diagnosis not present

## 2017-02-15 DIAGNOSIS — C7951 Secondary malignant neoplasm of bone: Secondary | ICD-10-CM | POA: Diagnosis not present

## 2017-02-15 DIAGNOSIS — Z8619 Personal history of other infectious and parasitic diseases: Secondary | ICD-10-CM | POA: Insufficient documentation

## 2017-02-15 DIAGNOSIS — E785 Hyperlipidemia, unspecified: Secondary | ICD-10-CM | POA: Insufficient documentation

## 2017-02-15 DIAGNOSIS — I509 Heart failure, unspecified: Secondary | ICD-10-CM | POA: Diagnosis not present

## 2017-02-15 DIAGNOSIS — G8929 Other chronic pain: Secondary | ICD-10-CM | POA: Insufficient documentation

## 2017-02-15 LAB — COMPREHENSIVE METABOLIC PANEL
ALK PHOS: 68 U/L (ref 38–126)
ALT: 27 U/L (ref 14–54)
ANION GAP: 10 (ref 5–15)
AST: 45 U/L — ABNORMAL HIGH (ref 15–41)
Albumin: 3.7 g/dL (ref 3.5–5.0)
BILIRUBIN TOTAL: 0.8 mg/dL (ref 0.3–1.2)
BUN: 16 mg/dL (ref 6–20)
CALCIUM: 8 mg/dL — AB (ref 8.9–10.3)
CO2: 26 mmol/L (ref 22–32)
CREATININE: 0.68 mg/dL (ref 0.44–1.00)
Chloride: 102 mmol/L (ref 101–111)
GFR calc non Af Amer: >60 mL/min (ref >60)
Glucose, Bld: 126 mg/dL — ABNORMAL HIGH (ref 65–99)
Potassium: 3.9 mmol/L (ref 3.5–5.1)
Sodium: 138 mmol/L (ref 135–145)
Total Protein: 6.8 g/dL (ref 6.5–8.1)

## 2017-02-15 LAB — CBC WITH DIFFERENTIAL/PLATELET
BASOS ABS: 0 10*3/uL (ref 0–0.1)
BASOS PCT: 2 %
EOS ABS: 0 10*3/uL (ref 0–0.7)
Eosinophils Relative: 3 %
HEMATOCRIT: 31.3 % — AB (ref 35.0–47.0)
HEMOGLOBIN: 10.5 g/dL — AB (ref 12.0–16.0)
Lymphocytes Relative: 41 %
Lymphs Abs: 0.8 10*3/uL — ABNORMAL LOW (ref 1.0–3.6)
MCH: 31.2 pg (ref 26.0–34.0)
MCHC: 33.7 g/dL (ref 32.0–36.0)
MCV: 92.6 fL (ref 80.0–100.0)
Monocytes Absolute: 0 10*3/uL — ABNORMAL LOW (ref 0.2–0.9)
Monocytes Relative: 2 %
NEUTROS ABS: 1 10*3/uL — AB (ref 1.4–6.5)
NEUTROS PCT: 52 %
Platelets: 106 10*3/uL — ABNORMAL LOW (ref 150–440)
RBC: 3.38 MIL/uL — ABNORMAL LOW (ref 3.80–5.20)
RDW: 13.6 % (ref 11.5–14.5)
WBC: 1.8 10*3/uL — ABNORMAL LOW (ref 3.6–11.0)

## 2017-02-20 ENCOUNTER — Telehealth: Payer: Self-pay

## 2017-02-20 ENCOUNTER — Encounter: Payer: Self-pay | Admitting: Emergency Medicine

## 2017-02-20 ENCOUNTER — Emergency Department: Payer: Medicare Other

## 2017-02-20 ENCOUNTER — Emergency Department
Admission: EM | Admit: 2017-02-20 | Discharge: 2017-02-20 | Disposition: A | Discharge status: Home / Self Care | Payer: Medicare Other | Attending: Emergency Medicine | Admitting: Emergency Medicine

## 2017-02-20 DIAGNOSIS — J45909 Unspecified asthma, uncomplicated: Secondary | ICD-10-CM | POA: Insufficient documentation

## 2017-02-20 DIAGNOSIS — Z87891 Personal history of nicotine dependence: Secondary | ICD-10-CM | POA: Diagnosis not present

## 2017-02-20 DIAGNOSIS — E119 Type 2 diabetes mellitus without complications: Secondary | ICD-10-CM | POA: Diagnosis not present

## 2017-02-20 DIAGNOSIS — M549 Dorsalgia, unspecified: Secondary | ICD-10-CM | POA: Diagnosis not present

## 2017-02-20 DIAGNOSIS — Z79899 Other long term (current) drug therapy: Secondary | ICD-10-CM | POA: Insufficient documentation

## 2017-02-20 DIAGNOSIS — R0600 Dyspnea, unspecified: Secondary | ICD-10-CM | POA: Insufficient documentation

## 2017-02-20 DIAGNOSIS — J441 Chronic obstructive pulmonary disease with (acute) exacerbation: Secondary | ICD-10-CM | POA: Diagnosis not present

## 2017-02-20 DIAGNOSIS — I5020 Unspecified systolic (congestive) heart failure: Secondary | ICD-10-CM | POA: Insufficient documentation

## 2017-02-20 DIAGNOSIS — Z85118 Personal history of other malignant neoplasm of bronchus and lung: Secondary | ICD-10-CM | POA: Insufficient documentation

## 2017-02-20 DIAGNOSIS — I110 Hypertensive heart disease with heart failure: Secondary | ICD-10-CM | POA: Insufficient documentation

## 2017-02-20 DIAGNOSIS — R05 Cough: Secondary | ICD-10-CM | POA: Insufficient documentation

## 2017-02-20 DIAGNOSIS — R0602 Shortness of breath: Secondary | ICD-10-CM | POA: Diagnosis not present

## 2017-02-20 DIAGNOSIS — J449 Chronic obstructive pulmonary disease, unspecified: Secondary | ICD-10-CM | POA: Diagnosis not present

## 2017-02-20 DIAGNOSIS — Z8505 Personal history of malignant neoplasm of liver: Secondary | ICD-10-CM | POA: Insufficient documentation

## 2017-02-20 DIAGNOSIS — Z7401 Bed confinement status: Secondary | ICD-10-CM | POA: Diagnosis not present

## 2017-02-20 LAB — CBC WITH DIFFERENTIAL/PLATELET
BLASTS: 0 %
Band Neutrophils: 0 %
Basophils Absolute: 0 10*3/uL (ref 0–0.1)
Basophils Relative: 0 %
Eosinophils Absolute: 0.1 10*3/uL (ref 0–0.7)
Eosinophils Relative: 1 %
HEMATOCRIT: 27.8 % — AB (ref 35.0–47.0)
HEMOGLOBIN: 9.1 g/dL — AB (ref 12.0–16.0)
LYMPHS PCT: 10 %
Lymphs Abs: 1 10*3/uL (ref 1.0–3.6)
MCH: 30.4 pg (ref 26.0–34.0)
MCHC: 32.9 g/dL (ref 32.0–36.0)
MCV: 92.3 fL (ref 80.0–100.0)
MONOS PCT: 9 %
Metamyelocytes Relative: 0 %
Monocytes Absolute: 0.9 10*3/uL (ref 0.2–0.9)
Myelocytes: 0 %
NEUTROS ABS: 8.1 10*3/uL — AB (ref 1.4–6.5)
NEUTROS PCT: 80 %
NRBC: 0 /100{WBCs}
OTHER: 0 %
PROMYELOCYTES ABS: 0 %
Platelets: 81 10*3/uL — ABNORMAL LOW (ref 150–440)
RBC: 3.01 MIL/uL — AB (ref 3.80–5.20)
RDW: 13.8 % (ref 11.5–14.5)
WBC: 10.1 10*3/uL (ref 3.6–11.0)

## 2017-02-20 LAB — COMPREHENSIVE METABOLIC PANEL
ALK PHOS: 95 U/L (ref 38–126)
ALT: 25 U/L (ref 14–54)
ANION GAP: 12 (ref 5–15)
AST: 37 U/L (ref 15–41)
Albumin: 3.1 g/dL — ABNORMAL LOW (ref 3.5–5.0)
BILIRUBIN TOTAL: 0.5 mg/dL (ref 0.3–1.2)
BUN: 24 mg/dL — ABNORMAL HIGH (ref 6–20)
CALCIUM: 7.5 mg/dL — AB (ref 8.9–10.3)
CO2: 23 mmol/L (ref 22–32)
CREATININE: 1.14 mg/dL — AB (ref 0.44–1.00)
Chloride: 102 mmol/L (ref 101–111)
GFR calc non Af Amer: 49 mL/min — ABNORMAL LOW (ref >60)
GFR, EST AFRICAN AMERICAN: 57 mL/min — AB (ref >60)
GLUCOSE: 132 mg/dL — AB (ref 65–99)
Potassium: 3.5 mmol/L (ref 3.5–5.1)
SODIUM: 137 mmol/L (ref 135–145)
TOTAL PROTEIN: 7.1 g/dL (ref 6.5–8.1)

## 2017-02-20 LAB — TROPONIN I: Troponin I: 0.03 ng/mL (ref <0.03)

## 2017-02-20 MED ORDER — IPRATROPIUM-ALBUTEROL 0.5-2.5 (3) MG/3ML IN SOLN
3.0000 mL | Freq: Once | RESPIRATORY_TRACT | Status: AC
  Administered 2017-02-20: 3 mL via RESPIRATORY_TRACT
  Filled 2017-02-20: qty 3

## 2017-02-20 MED ORDER — PREDNISONE 20 MG PO TABS: 40.0000 mg | ORAL_TABLET | Freq: Every day | ORAL | 0 refills | Status: DC

## 2017-02-20 MED ORDER — METHYLPREDNISOLONE SODIUM SUCC 125 MG IJ SOLR
125.0000 mg | Freq: Once | INTRAMUSCULAR | Status: AC
  Administered 2017-02-20: 125 mg via INTRAVENOUS
  Filled 2017-02-20: qty 2

## 2017-02-20 NOTE — Discharge Instructions (Signed)
Please follow-up with your doctor, call today to inform them of today's ER visit.  Please take your steroids as prescribed.  Return to the emergency department for any chest pain, or any increased trouble breathing.

## 2017-02-20 NOTE — ED Provider Notes (Signed)
[facility] Emergency Department Provider Note  Time seen: 2:03 PM  I have reviewed the triage vital signs and the nursing notes.   HISTORY  Chief Complaint Shortness of Breath    HPI Grace [name] is a 67 y.o. female with a past medical history of asthma, anxiety, COPD, CHF, hypertension, hyperlipidemia, left lung cancer status post chemotherapy last received chemotherapy 10 days ago, who presents to the emergency department for cough, congestion, shortness of breath.  According to the patient she has had a cough with yellow/brown sputum as well as shortness of breath worsening over the past 2 weeks.  She states over the past 48 hours though her sputum has become very thick and she has become much more short of breath.  Patient states she gets winded with minimal exertion such as getting up to use the bathroom.  The patient was referred to the ER by her oncologist for further evaluation.  Currently the patient is satting 93% on room air.  Patient has no home O2 requirement.  But no distress, but mild tachypnea due to shortness of breath.  Past Medical History:  Diagnosis Date  . Anxiety   . Arthritis   . Asthma    COPD  . Cancer ([place])    Liver  . CAP (community acquired pneumonia) 06/30/2014  . Cardiomyopathy ([place])    nonischemic (EF 35-40%)  . CHF (congestive heart failure) ([place])   . COPD (chronic obstructive pulmonary disease) ([place])   . Depression   . Dyspnea   . GERD (gastroesophageal reflux disease)   . Hyperlipidemia   . Hypertension   . Pneumonia   . PVC's (premature ventricular contractions)    states 10 years ago  . Sepsis ([place]) 06/30/2014  . SOB (shortness of breath) 05/08/2014  . Spinal cord injury at T7-T12 level [facility])    2016    Patient Active Problem List   Diagnosis Date Noted  . 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 Liver mass 01/10/2017  . Chronic acromioclavicular pain (Right) 12/27/2016  . Osteoarthritis acromioclavicular  joint (Right) 12/27/2016  . Osteoarthritis glenohumeral joint (Left) 12/27/2016  . Medial epicondylitis of elbow (Right) 12/27/2016  . Chronic shoulder pain (Bilateral) (R>L) 12/26/2016  . Chronic elbow pain (Right) 12/26/2016  . Long term prescription benzodiazepine use 12/07/2016  . Long term prescription opiate use 12/07/2016  . Opiate use 12/07/2016  . NSAID long-term use 12/07/2016  . Osteoarthritis of knees (Bilateral) 11/22/2016  . Thoracic compression fracture (T4, T5, T6, and T7) 11/22/2016  . Breast cyst 11/21/2016  . Cardiomyopathy ([place]) 11/21/2016  . Diabetes mellitus type 2, uncomplicated ([place]) 32/20/2542  . Fibroids 11/21/2016  . Hidradenitis 11/21/2016  . Nicotine abuse 11/21/2016  . Osteoarthritis cervical spine 11/21/2016  . Osteoarthritis of hands (Bilateral) 11/21/2016  . PUD (peptic ulcer disease) 11/21/2016  . Pulmonary nodule 11/21/2016  . Chronic knee pain (Primary Area of Pain) (Bilateral) 11/21/2016  . Chronic pain syndrome 11/21/2016  . Lumbar stenosis with neurogenic claudication 08/01/2016  . Systolic dysfunction with acute on chronic heart failure ([place])   . CHF (congestive heart failure), NYHA class I ([place]) 08/22/2014  . GERD (gastroesophageal reflux disease) 06/30/2014  . Depression 06/30/2014  . PVC's (premature ventricular contractions) 05/08/2014  . 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 Obesity 05/08/2014  . Tachycardia 05/08/2014  . 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 APPENDECTOMY    .  CARDIAC CATHETERIZATION    . CATARACT EXTRACTION     right eye   . CESAREAN SECTION    . COLONOSCOPY    . LAMINOTOMY    . LUMBAR LAMINECTOMY/DECOMPRESSION MICRODISCECTOMY Left 08/01/2016   Procedure: Left Lumbar Four-Five Laminectomy and Foraminotomy;  Surgeon: Earnie Larsson, MD;  Location: [place];  Service: Neurosurgery;   Laterality: Left;  . PORTA CATH INSERTION N/A 02/03/2017   Procedure: PORTA CATH INSERTION;  Surgeon: Katha Cabal, MD;  Location: [place] [facility];  Service: Cardiovascular;  Laterality: N/A;    Prior to Admission medications   Medication Sig Start Date End Date Taking? Authorizing Provider  albuterol (PROAIR HFA) 108 (90 Base) MCG/ACT inhaler Inhale 2 puffs into the lungs every 6 (six) hours as needed for wheezing or shortness of breath.  08/09/16   [provider]  alendronate (FOSAMAX) 70 MG tablet Take 70 mg by mouth every Thursday.  06/23/16   [provider]  ARIPiprazole (ABILIFY) 5 MG tablet Take 5 mg by mouth daily.    [provider]  Calcium Citrate-Vitamin D (CALCIUM + D PO) Take 1 tablet by mouth 2 (two) times daily.    [provider]  DULoxetine (CYMBALTA) 60 MG capsule Take 60 mg by mouth daily.    [provider]  fentaNYL (DURAGESIC - DOSED MCG/HR) 25 MCG/HR patch Place 1 patch (25 mcg total) onto the skin every 3 (three) days. 01/24/17   Lloyd Huger, MD  lidocaine-prilocaine (EMLA) cream Apply to affected area once 01/31/17   Lloyd Huger, MD  LORazepam (ATIVAN) 0.5 MG tablet Take 0.5 mg by mouth daily as needed for anxiety.     [provider]  metoprolol succinate (TOPROL-XL) 100 MG 24 hr tablet TAKE ONE TABLET BY MOUTH ONCE DAILY 08/28/15   Minna Merritts, MD  Omega-3 Fatty Acids (FISH OIL) 1000 MG CAPS Take 1,000 mg by mouth daily.    [provider]  ondansetron (ZOFRAN) 8 MG tablet Take 1 tablet (8 mg total) by mouth 2 (two) times daily as needed for refractory nausea / vomiting. 01/31/17   Lloyd Huger, MD  oxyCODONE (OXYCONTIN) 20 mg 12 hr tablet Take 1 tablet (20 mg total) by mouth every 12 (twelve) hours. 01/30/17   Lloyd Huger, MD  oxyCODONE-acetaminophen (PERCOCET/ROXICET) 5-325 MG tablet Take 1-2 tablets by mouth every 6 (six) hours as needed. For pain. 02/08/17    Lloyd Huger, MD  prochlorperazine (COMPAZINE) 10 MG tablet Take 1 tablet (10 mg total) by mouth every 6 (six) hours as needed (Nausea or vomiting). 01/31/17   Lloyd Huger, MD  promethazine (PHENERGAN) 25 MG tablet Take 1 tablet (25 mg total) by mouth every 6 (six) hours as needed for nausea or vomiting. 01/20/17   Lloyd Huger, MD  sacubitril-valsartan (ENTRESTO) 49-51 MG Take 1 tablet by mouth 2 (two) times daily. 07/25/16   Minna Merritts, MD    Allergies  Allergen Reactions  . Iodine Anaphylaxis    IVP dye  . Fentanyl     Overly sedated and groggy, ineffective     Family History  Problem Relation Age of Onset  . Heart attack Father 50       MI x 3     Social History Social History   Tobacco Use  . Smoking status: Former Smoker    Packs/day: 0.50    Years: 50.00    Pack years: 25.00    Types: Cigarettes  Last attempt to quit: 01/27/2015    Years since quitting: 2.0  . Smokeless tobacco: Never Used  Substance Use Topics  . Alcohol use: No  . Drug use: No    Review of Systems Constitutional: Negative for fever. Eyes: Negative for visual changes. ENT: Positive for congestion. Cardiovascular: Negative for chest pain. Respiratory: Positive for shortness of breath.  Positive for cough, brown/yellow sputum production.  Gastrointestinal: Negative for abdominal pain, vomiting  Genitourinary: Negative for dysuria. Musculoskeletal: Negative for leg pain or swelling. Skin: Negative for rash. Neurological: Negative for headache All other ROS negative  ____________________________________________   PHYSICAL EXAM:  VITAL SIGNS: ED Triage Vitals  Enc Vitals Group     BP 02/20/17 1400 111/64     Pulse Rate 02/20/17 1400 (!) 116     Resp 02/20/17 1400 18     Temp 02/20/17 1400 98.3 F (36.8 C)     Temp Source 02/20/17 1400 Oral     SpO2 02/20/17 1400 95 %     Weight 02/20/17 1401 210 lb (95.3 kg)     Height 02/20/17 1401 5\' 5"  (1.651 m)      Head Circumference --      Peak Flow --      Pain Score 02/20/17 1400 0     Pain Loc --      Pain Edu? --      Excl. in [place]? --    Constitutional: Alert and oriented. Well appearing and in no distress. Eyes: Normal exam ENT   Head: Normocephalic and atraumatic.   Mouth/Throat: Mucous membranes are moist. Cardiovascular: Regular rhythm, rate around 100 bpm.  No obvious murmur. Respiratory: Patient has mild tachypnea, moderate left rhonchi on exam. Gastrointestinal: Soft and nontender. No distention.   Musculoskeletal: Nontender with normal range of motion in all extremities. No lower extremity tenderness or edema. Neurologic:  Normal speech and language. No gross focal neurologic deficits Skin:  Skin is warm, dry and intact.  Psychiatric: Mood and affect are normal.   ____________________________________________   EKG reviewed and interpreted by myself shows sinus tachycardia 127 bpm, narrow QRS, normal axis, normal intervals nonspecific ST changes.  No ST elevation.  Slight lateral ST depression.  RADIOLOGY  IMPRESSION: Port-A-Cath tip proximal SVC  Improvement in mediastinal mass  COPD  ____________________________________________   INITIAL IMPRESSION / ASSESSMENT AND PLAN / ED COURSE  Pertinent labs & imaging results that were available during my care of the patient were reviewed by me and considered in my medical decision making (see chart for details).  Patient presents to the emergency department for shortness of breath cough congestion.  Patient states worsening symptoms over the past 2 weeks but significantly worse over the past 48 hours.  Differential would include COPD exacerbation, postobstructive pneumonia, pneumonia, lung mass increase, pneumothorax.  We will check labs, chest x-ray and continue to closely monitor.  Currently 93-95% on room air.  No home O2 baseline requirement.  Patient continues to sat 95% on room air.  Patient uses a wheelchair is  nonambulatory at baseline.  Patient states she strongly wishes to be discharged home if at all possible.  Patient's labs are reassuring.  Chest x-ray shows COPD but no acute abnormality.  We will dose Solu-Medrol, DuoNeb in the emergency department as long the patient continues to feel well we will likely discharge home with continued prednisone treatment and patient will follow up with her oncologist.  ____________________________________________   FINAL CLINICAL IMPRESSION(S) / ED DIAGNOSES  Dyspnea COPD exacerbation  Harvest Dark, MD 02/20/17 1505

## 2017-02-20 NOTE — Telephone Encounter (Signed)
Received call from [facility] regarding her upcoming chemo infusion appointments. We went over all upcoming appointments. She reports she is very short of breath stating she is having a COPD flare. Coughing up brown sputum. Reports it was yellow that has now turned brown. Offered appointment today in [facility]. She states she has to come down her house steps on her bottom and feels she is to short of breath to do this. Instructed she needs to go to the ED for evaluation. She voiced she is worried if she goes and they do not admit her that EMS may not bring her home. Again stressed that if she is that short of breath she needs to go to the ED. She stated she will call her daughter and she will go. Oncology Nurse Navigator Documentation  Navigator Location: [facility] (02/20/17 1000)   )Navigator Encounter Type: Telephone (02/20/17 1000) Telephone: Lahoma Crocker Call;Appt Confirmation/Clarification (02/20/17 1000)                                                  Time Spent with Patient: 15 (02/20/17 1000)

## 2017-02-20 NOTE — ED Triage Notes (Signed)
Pt to ED via EMS from home with c/o SOB since yesterday. Pt hx of COPD and lung cancer, per oncologist pt come to ED for eval. Pt was 95% on RA, was given 1 duoneb in route. Pt last chemo was 12/28. Pt A&Ox4, MD at bedside

## 2017-02-20 NOTE — Telephone Encounter (Signed)
Agreed, thank you.

## 2017-02-23 ENCOUNTER — Telehealth: Payer: Self-pay

## 2017-02-23 NOTE — Telephone Encounter (Signed)
Received call from [facility] for Ed follow up. She does not feel like she can go see her PCP and Dr Grayland Ormond for follow up. Reports her SOB is better but she does not feel well at all.  She is still taking Prednisone 40mg  and has 4 days left. Spoke with Dr. Grayland Ormond and we can just see her here in the [facility]. Jacqueleen states she can only come or Monday or Tuesday. Dr. Grayland Ormond would like her to be seen before her chemo appointment on Wednesday. Appointment arranged for Monday, 1/14, at 1030 with Beckey Rutter. She is aware of date/time with read back performed. Oncology Nurse Navigator Documentation  Navigator Location: [facility] (02/23/17 1500)   )Navigator Encounter Type: Telephone (02/23/17 1500) Telephone: Symptom Mgt (02/23/17 1500)                                                  Time Spent with Patient: 15 (02/23/17 1500)

## 2017-02-26 NOTE — Progress Notes (Signed)
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  INTERVAL HISTORY: Patient returns to clinic today for further evaluation and consideration of cycle 2, day 1 of carboplatinum, etoposide, and Tecentriq.  She states she is having a COPD flare with worsening shortness of breath and difficulty getting up at the 3 stairs into her house.  She has chronic pain and is seen in the pain clinic on a regular basis, but otherwise feels well.  She has no neurologic complaints.  She denies any recent fevers or illnesses. She has a good appetite and denies weight loss.  She has no chest pain or hemoptysis.  She denies any nausea, vomiting, constipation, or diarrhea.  She has no abdominal pain.  She denies any melena or hematochezia.  She has no urinary complaints.  Patient offers no further specific complaints today.  REVIEW OF SYSTEMS:   Review of Systems  Constitutional: Negative.  Negative for fever, malaise/fatigue and weight loss.  Respiratory: Positive for cough, shortness of breath and wheezing. Negative for hemoptysis.   Cardiovascular: Negative.  Negative for chest pain and leg swelling.  Gastrointestinal: Negative.  Negative for abdominal pain, blood in stool and melena.  Genitourinary: Negative.   Musculoskeletal: Positive for back pain, joint pain and neck pain.  Skin: Negative.  Negative for rash.  Neurological: Negative.  Negative for sensory change and weakness.  Psychiatric/Behavioral: Negative.  The patient is not nervous/anxious.     As per HPI. Otherwise, a complete review of systems is negative.  PAST MEDICAL HISTORY: Past Medical History:  Diagnosis Date  . Anxiety   . Arthritis   . Asthma     COPD  . Cancer ([place])    Liver  . CAP (community acquired pneumonia) 06/30/2014  . Cardiomyopathy ([place])    nonischemic (EF 35-40%)  . CHF (congestive heart failure) ([place])   . COPD (chronic obstructive pulmonary disease) ([place])   . Depression   . Dyspnea   . GERD (gastroesophageal reflux disease)   . Hyperlipidemia   . Hypertension   . Pneumonia   . PVC's (premature ventricular contractions)    states 10 years ago  . Sepsis ([place]) 06/30/2014  . SOB (shortness of breath) 05/08/2014  . Spinal cord injury at T7-T12 level [facility])    2016    PAST SURGICAL HISTORY: Past Surgical History:  Procedure Laterality Date  . APPENDECTOMY    . CARDIAC CATHETERIZATION    . CATARACT EXTRACTION     right eye   . CESAREAN SECTION    . COLONOSCOPY    . LAMINOTOMY    . LUMBAR LAMINECTOMY/DECOMPRESSION MICRODISCECTOMY Left 08/01/2016   Procedure: Left Lumbar Four-Five Laminectomy and Foraminotomy;  Surgeon: Earnie Larsson, MD;  Location: [place];  Service: Neurosurgery;  Laterality: Left;  . PORTA CATH INSERTION N/A 02/03/2017   Procedure: PORTA CATH INSERTION;  Surgeon: Katha Cabal, MD;  Location: [place] [facility];  Service: Cardiovascular;  Laterality: N/A;    FAMILY HISTORY: Family History  Problem Relation Age of Onset  . Heart attack Father 20       MI x 3     ADVANCED DIRECTIVES (Y/N):  N  HEALTH MAINTENANCE: Social History   Tobacco Use  . Smoking status:  Former Smoker    Packs/day: 0.50    Years: 50.00    Pack years: 25.00    Types: Cigarettes    Last attempt to quit: 01/27/2015    Years since quitting: 2.1  . Smokeless tobacco: Never Used  Substance Use Topics  . Alcohol use: No  . Drug use: No     Colonoscopy:  PAP:  Bone density:  Lipid panel:  Allergies  Allergen Reactions  . Iodine Anaphylaxis    IVP dye  . Fentanyl     Overly sedated and groggy, ineffective     Current Outpatient Medications  Medication Sig Dispense Refill  . albuterol (PROAIR  HFA) 108 (90 Base) MCG/ACT inhaler Inhale 2 puffs into the lungs every 6 (six) hours as needed for wheezing or shortness of breath.     Marland Kitchen alendronate (FOSAMAX) 70 MG tablet Take 70 mg by mouth every Thursday.     . ARIPiprazole (ABILIFY) 5 MG tablet Take 5 mg by mouth daily.    . Calcium Citrate-Vitamin D (CALCIUM + D PO) Take 1 tablet by mouth 2 (two) times daily.    . DULoxetine (CYMBALTA) 60 MG capsule Take 60 mg by mouth daily.    Marland Kitchen lidocaine-prilocaine (EMLA) cream Apply to affected area once 30 g 3  . LORazepam (ATIVAN) 0.5 MG tablet Take 0.5 mg by mouth daily as needed for anxiety.     . metoprolol succinate (TOPROL-XL) 100 MG 24 hr tablet TAKE ONE TABLET BY MOUTH ONCE DAILY 30 tablet 0  . Omega-3 Fatty Acids (FISH OIL) 1000 MG CAPS Take 1,000 mg by mouth daily.    . ondansetron (ZOFRAN) 8 MG tablet Take 1 tablet (8 mg total) by mouth 2 (two) times daily as needed for refractory nausea / vomiting. 30 tablet 2  . oxyCODONE-acetaminophen (PERCOCET/ROXICET) 5-325 MG tablet Take 1-2 tablets by mouth every 6 (six) hours as needed. For pain. 120 tablet 0  . predniSONE (DELTASONE) 20 MG tablet Take 2 tablets (40 mg total) by mouth daily. 10 tablet 0  . prochlorperazine (COMPAZINE) 10 MG tablet Take 1 tablet (10 mg total) by mouth every 6 (six) hours as needed (Nausea or vomiting). 60 tablet 2  . promethazine (PHENERGAN) 25 MG tablet Take 1 tablet (25 mg total) by mouth every 6 (six) hours as needed for nausea or vomiting. 30 tablet 2  . sacubitril-valsartan (ENTRESTO) 49-51 MG Take 1 tablet by mouth 2 (two) times daily. 60 tablet 6  . fentaNYL (DURAGESIC - DOSED MCG/HR) 25 MCG/HR patch Place 1 patch (25 mcg total) onto the skin every 3 (three) days. (Patient not taking: Reported on 03/01/2017) 10 patch 0  . oxyCODONE (OXYCONTIN) 20 mg 12 hr tablet Take 1 tablet (20 mg total) by mouth every 12 (twelve) hours. (Patient not taking: Reported on 03/01/2017) 60 tablet 0  . potassium chloride SA  (K-DUR,KLOR-CON) 20 MEQ tablet Take 1 tablet (20 mEq total) by mouth 2 (two) times daily. 60 tablet 1   No current facility-administered medications for this visit.    Facility-Administered Medications Ordered in Other Visits  Medication Dose Route Frequency Provider Last Rate Last Dose  . sodium chloride flush (NS) 0.9 % injection 10 mL  10 mL Intravenous PRN Lloyd Huger, MD   10 mL at 03/01/17 0841    OBJECTIVE: Vitals:   03/01/17 0921  BP: 109/77  Pulse: (!) 107  Resp: 20  Temp: 98.2 F (36.8 C)     Body mass index is 34.8 kg/m.  ECOG FS:2 - Symptomatic, <50% confined to bed  General: Well-developed, well-nourished, no acute distress.  Sitting in a wheelchair. Eyes: Pink conjunctiva, anicteric sclera. Lungs: Diminished breath sounds bilaterally, scattered wheezing. Heart: Regular rate and rhythm. No rubs, murmurs, or gallops. Abdomen: Soft, nontender, nondistended. No organomegaly noted, normoactive bowel sounds. Musculoskeletal: No edema, cyanosis, or clubbing. Neuro: Alert, answering all questions appropriately. Cranial nerves grossly intact. Skin: No rashes or petechiae noted. Psych: Normal affect.   LAB RESULTS:  Lab Results  Component Value Date   NA 136 03/01/2017   K 3.2 (L) 03/01/2017   CL 100 (L) 03/01/2017   CO2 22 03/01/2017   GLUCOSE 195 (H) 03/01/2017   BUN 10 03/01/2017   CREATININE 0.74 03/01/2017   CALCIUM 7.7 (L) 03/01/2017   PROT 7.8 03/01/2017   ALBUMIN 3.3 (L) 03/01/2017   AST 26 03/01/2017   ALT 26 03/01/2017   ALKPHOS 96 03/01/2017   BILITOT 0.7 03/01/2017   GFRNONAA >60 03/01/2017   GFRAA >60 03/01/2017    Lab Results  Component Value Date   WBC 16.8 (H) 03/01/2017   NEUTROABS 14.0 (H) 03/01/2017   HGB 9.9 (L) 03/01/2017   HCT 30.0 (L) 03/01/2017   MCV 94.9 03/01/2017   PLT 508 (H) 03/01/2017     STUDIES: Dg Chest 2 View  Result Date: 02/20/2017 CLINICAL DATA:  Short of breath and cough.  Small cell lung cancer  EXAM: CHEST  2 VIEW COMPARISON:  CT chest 01/19/2017 FINDINGS: Right jugular Port-A-Cath tip in the proximal SVC. No pneumothorax. COPD Mass lesion in the AP window has improved since the prior CT. Negative for pneumonia or effusion. IMPRESSION: Port-A-Cath tip proximal SVC Improvement in mediastinal mass COPD Electronically Signed   By: Franchot Gallo M.D.   On: 02/20/2017 14:40   Mr Jeri Cos [place] Contrast  Result Date: 02/04/2017 CLINICAL DATA:  Staging for lung cancer. EXAM: MRI HEAD WITHOUT AND WITH CONTRAST TECHNIQUE: Multiplanar, multiecho pulse sequences of the brain and surrounding structures were obtained without and with intravenous contrast. CONTRAST:  40mL MULTIHANCE GADOBENATE DIMEGLUMINE 529 MG/ML IV SOLN COMPARISON:  None. FINDINGS: Brain: No enhancement or swelling to suggest metastatic disease. Moderate chronic small vessel ischemia in the cerebral white matter. No hydrocephalus, collection, or atrophy. Vascular: Major flow voids and vascular enhancements are preserved. Skull and upper cervical spine: Focal T1 hypointense areas within the C3 body, posterior dens, left occipital condyle, and left parietal calvarium. Sinuses/Orbits: Negative for mass.  Right cataract resection. IMPRESSION: 1. No evidence of intracranial metastasis. 2. Calvarial, spine, and left occipital metastases. Electronically Signed   By: Monte Fantasia M.D.   On: 02/04/2017 09:10   Nm Pet Image Initial (pi) Skull Base To Thigh  Result Date: 02/06/2017 CLINICAL DATA:  Initial treatment strategy for lung mass. Small cell lung cancer. EXAM: NUCLEAR MEDICINE PET SKULL BASE TO THIGH TECHNIQUE: 13.4 mCi F-18 FDG was injected intravenously. Full-ring PET imaging was performed from the skull base to thigh after the radiotracer. CT data was obtained and used for attenuation correction and anatomic localization. FASTING BLOOD GLUCOSE:  Value: 123 mg/dl COMPARISON:  CT 02/08/2017 FINDINGS: NECK No hypermetabolic lymph nodes in the  neck. CHEST Port in the RIGHT chest. Large LEFT parahilar mass measures 6 cm x 6 cm with SUV max equal 12.7. Mass extends into the AP window. Contralateral hypermetabolic adenopathy in the RIGHT lower paratracheal nodal station as well subcarinal nodal station. No hypermetabolic parenchymal pulmonary nodules. ABDOMEN/PELVIS Large mass lesion in the central  LEFT hepatic lobe measures 7.0 x 10.0 cm with SUV max equal 8.2. Similar expansile lesion in the LEFT lateral hepatic lobe measures 5.8 cm with SUV max equals 6.7. Hypermetabolic periportal adenopathy adjacent the pancreatic head. No pancreatic mass lesion identified. Normal adrenal glands.  Normal spleen and kidneys SKELETON: Several discrete hypermetabolic foci within the skeleton consistent metastatic disease. For example lesion in the intertrochanteric RIGHT femur with SUV max equal 4.2. Lesion in the transverse process of the T10 vertebral body on the RIGHT with SUV max equals 6.0. IMPRESSION: 1. Hypermetabolic LEFT hilar mass extending into AP window consistent with small cell lung cancer. 2. Hypermetabolic contralateral mediastinal adenopathy. 3. Large hypermetabolic masses within the LEFT hepatic lobe. 4. Nodal metastasis the upper abdomen at the periportal nodal station. 5. Several discrete hyper meta skeletal metastasis as above. Electronically Signed   By: Suzy Bouchard M.D.   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  PLAN:  1. 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  MRI of the brain on February 04, 2017 did not reveal any metastatic disease.  P proceed with cycle 2, day 1 of carboplatinum, Tecentriq, and etoposide on day 1 and etoposide only on days 2 and 3.  Plan to do 4 treatments with Neulasta support every 21 days and then reimage with  PET scan. If there is improvement of her disease burden, will then continue Tecentriq maintenance treatment every 3 weeks.  Patient will also receive Zometa on the odd numbered cycles for her bony disease.  Return to clinic in 1 and 2 days for etoposide only and then in 3 weeks for consideration of cycle 3, day 1.   2.  Elevated liver enzymes: Resolved. 3.  Pain: Patient states long-acting narcotics are too expensive and she only takes Percocet.  Had a lengthy discussion with the patient about adjusting her narcotic use. 4.  Anemia: Secondary to chemotherapy, monitor. 5.  COPD flare: Continue prednisone as prescribed.  Have also referred patient to outpatient palliative care for further evaluation and consideration of nebulizer treatments at home.  Approximately 30 minutes was spent in discussion of which greater than 50% was consultation.  Patient expressed understanding and was in agreement with this plan. She also understands that She can call clinic at any time with any questions, concerns, or complaints.   Cancer Staging Small cell lung cancer [facility]) Staging form: Lung, AJCC 8th Edition - Clinical stage from 01/28/2017: Stage IV (cT4, cN3, pM1c) - Signed by Lloyd Huger, MD on 01/28/2017   Lloyd Huger, MD   03/04/2017 8:15 AM

## 2017-02-27 ENCOUNTER — Inpatient Hospital Stay: Payer: Medicare Other

## 2017-02-27 ENCOUNTER — Telehealth: Payer: Self-pay | Admitting: Nurse Practitioner

## 2017-02-27 ENCOUNTER — Inpatient Hospital Stay: Payer: Medicare Other | Admitting: Nurse Practitioner

## 2017-02-28 ENCOUNTER — Telehealth: Payer: Self-pay

## 2017-02-28 NOTE — Telephone Encounter (Signed)
Received call from [facility] regarding appointment on 1/16. She reports she is feeling well but she is getting very SOB when attempting to go to the bathroom or get down the steps to come to appointments. States she just does not know what to do. Cancelled appointment in [facility] yesterday due to the same. She agreed she would come to appointment tomorrow and symptoms and treatment plan can be addressed by Dr. Grayland Ormond. Oncology Nurse Navigator Documentation  Navigator Location: [facility] (02/28/17 1400)   )Navigator Encounter Type: Telephone (02/28/17 1400) Telephone: Incoming Call (02/28/17 1400)                                                  Time Spent with Patient: 15 (02/28/17 1400)

## 2017-03-01 ENCOUNTER — Inpatient Hospital Stay: Payer: Medicare Other

## 2017-03-01 ENCOUNTER — Other Ambulatory Visit: Payer: Self-pay

## 2017-03-01 ENCOUNTER — Inpatient Hospital Stay (HOSPITAL_BASED_OUTPATIENT_CLINIC_OR_DEPARTMENT_OTHER): Payer: Medicare Other | Admitting: Oncology

## 2017-03-01 ENCOUNTER — Encounter: Payer: Self-pay | Admitting: Oncology

## 2017-03-01 VITALS — BP 109/77 | HR 107 | Temp 98.2°F | Resp 20 | Wt 209.1 lb

## 2017-03-01 DIAGNOSIS — C7951 Secondary malignant neoplasm of bone: Secondary | ICD-10-CM | POA: Diagnosis not present

## 2017-03-01 DIAGNOSIS — C3490 Malignant neoplasm of unspecified part of unspecified bronchus or lung: Secondary | ICD-10-CM | POA: Diagnosis not present

## 2017-03-01 DIAGNOSIS — I10 Essential (primary) hypertension: Secondary | ICD-10-CM

## 2017-03-01 DIAGNOSIS — F419 Anxiety disorder, unspecified: Secondary | ICD-10-CM

## 2017-03-01 DIAGNOSIS — Z9049 Acquired absence of other specified parts of digestive tract: Secondary | ICD-10-CM

## 2017-03-01 DIAGNOSIS — I700 Atherosclerosis of aorta: Secondary | ICD-10-CM

## 2017-03-01 DIAGNOSIS — R599 Enlarged lymph nodes, unspecified: Secondary | ICD-10-CM

## 2017-03-01 DIAGNOSIS — J45901 Unspecified asthma with (acute) exacerbation: Secondary | ICD-10-CM | POA: Diagnosis not present

## 2017-03-01 DIAGNOSIS — Z5112 Encounter for antineoplastic immunotherapy: Secondary | ICD-10-CM

## 2017-03-01 DIAGNOSIS — K219 Gastro-esophageal reflux disease without esophagitis: Secondary | ICD-10-CM

## 2017-03-01 DIAGNOSIS — D61818 Other pancytopenia: Secondary | ICD-10-CM | POA: Diagnosis not present

## 2017-03-01 DIAGNOSIS — G8929 Other chronic pain: Secondary | ICD-10-CM

## 2017-03-01 DIAGNOSIS — Z87891 Personal history of nicotine dependence: Secondary | ICD-10-CM

## 2017-03-01 DIAGNOSIS — I509 Heart failure, unspecified: Secondary | ICD-10-CM

## 2017-03-01 DIAGNOSIS — Z5111 Encounter for antineoplastic chemotherapy: Secondary | ICD-10-CM | POA: Diagnosis not present

## 2017-03-01 DIAGNOSIS — F329 Major depressive disorder, single episode, unspecified: Secondary | ICD-10-CM

## 2017-03-01 DIAGNOSIS — J441 Chronic obstructive pulmonary disease with (acute) exacerbation: Secondary | ICD-10-CM | POA: Diagnosis not present

## 2017-03-01 DIAGNOSIS — Z7689 Persons encountering health services in other specified circumstances: Secondary | ICD-10-CM

## 2017-03-01 DIAGNOSIS — E785 Hyperlipidemia, unspecified: Secondary | ICD-10-CM

## 2017-03-01 DIAGNOSIS — C787 Secondary malignant neoplasm of liver and intrahepatic bile duct: Secondary | ICD-10-CM | POA: Diagnosis not present

## 2017-03-01 DIAGNOSIS — K5730 Diverticulosis of large intestine without perforation or abscess without bleeding: Secondary | ICD-10-CM | POA: Diagnosis not present

## 2017-03-01 DIAGNOSIS — J449 Chronic obstructive pulmonary disease, unspecified: Secondary | ICD-10-CM

## 2017-03-01 DIAGNOSIS — Z8619 Personal history of other infectious and parasitic diseases: Secondary | ICD-10-CM

## 2017-03-01 DIAGNOSIS — R0602 Shortness of breath: Secondary | ICD-10-CM

## 2017-03-01 DIAGNOSIS — M2550 Pain in unspecified joint: Secondary | ICD-10-CM

## 2017-03-01 DIAGNOSIS — Z79899 Other long term (current) drug therapy: Secondary | ICD-10-CM | POA: Diagnosis not present

## 2017-03-01 DIAGNOSIS — I429 Cardiomyopathy, unspecified: Secondary | ICD-10-CM

## 2017-03-01 DIAGNOSIS — M549 Dorsalgia, unspecified: Secondary | ICD-10-CM

## 2017-03-01 DIAGNOSIS — Z8701 Personal history of pneumonia (recurrent): Secondary | ICD-10-CM

## 2017-03-01 DIAGNOSIS — M542 Cervicalgia: Secondary | ICD-10-CM

## 2017-03-01 LAB — COMPREHENSIVE METABOLIC PANEL
ALT: 26 U/L (ref 14–54)
AST: 26 U/L (ref 15–41)
Albumin: 3.3 g/dL — ABNORMAL LOW (ref 3.5–5.0)
Alkaline Phosphatase: 96 U/L (ref 38–126)
Anion gap: 14 (ref 5–15)
BUN: 10 mg/dL (ref 6–20)
CHLORIDE: 100 mmol/L — AB (ref 101–111)
CO2: 22 mmol/L (ref 22–32)
CREATININE: 0.74 mg/dL (ref 0.44–1.00)
Calcium: 7.7 mg/dL — ABNORMAL LOW (ref 8.9–10.3)
Glucose, Bld: 195 mg/dL — ABNORMAL HIGH (ref 65–99)
POTASSIUM: 3.2 mmol/L — AB (ref 3.5–5.1)
Sodium: 136 mmol/L (ref 135–145)
Total Bilirubin: 0.7 mg/dL (ref 0.3–1.2)
Total Protein: 7.8 g/dL (ref 6.5–8.1)

## 2017-03-01 LAB — CBC WITH DIFFERENTIAL/PLATELET
Basophils Absolute: 0.1 10*3/uL (ref 0–0.1)
Basophils Relative: 0 %
EOS PCT: 0 %
Eosinophils Absolute: 0 10*3/uL (ref 0–0.7)
HCT: 30 % — ABNORMAL LOW (ref 35.0–47.0)
Hemoglobin: 9.9 g/dL — ABNORMAL LOW (ref 12.0–16.0)
LYMPHS ABS: 1.6 10*3/uL (ref 1.0–3.6)
LYMPHS PCT: 10 %
MCH: 31.1 pg (ref 26.0–34.0)
MCHC: 32.8 g/dL (ref 32.0–36.0)
MCV: 94.9 fL (ref 80.0–100.0)
MONO ABS: 1.1 10*3/uL — AB (ref 0.2–0.9)
Monocytes Relative: 7 %
Neutro Abs: 14 10*3/uL — ABNORMAL HIGH (ref 1.4–6.5)
Neutrophils Relative %: 83 %
PLATELETS: 508 10*3/uL — AB (ref 150–440)
RBC: 3.17 MIL/uL — ABNORMAL LOW (ref 3.80–5.20)
RDW: 15 % — ABNORMAL HIGH (ref 11.5–14.5)
WBC: 16.8 10*3/uL — ABNORMAL HIGH (ref 3.6–11.0)

## 2017-03-01 MED ORDER — POTASSIUM CHLORIDE CRYS ER 20 MEQ PO TBCR: 20.0000 meq | EXTENDED_RELEASE_TABLET | Freq: Two times a day (BID) | ORAL | 1 refills | Status: DC

## 2017-03-01 MED ORDER — OXYCODONE-ACETAMINOPHEN 5-325 MG PO TABS: 1.0000 | ORAL_TABLET | Freq: Four times a day (QID) | ORAL | 0 refills | Status: DC | PRN

## 2017-03-01 MED ORDER — SODIUM CHLORIDE 0.9 % IV SOLN
Freq: Once | INTRAVENOUS | Status: AC
  Administered 2017-03-01: 10:00:00 via INTRAVENOUS
  Filled 2017-03-01: qty 1000

## 2017-03-01 MED ORDER — PALONOSETRON HCL INJECTION 0.25 MG/5ML
0.2500 mg | Freq: Once | INTRAVENOUS | Status: AC
  Administered 2017-03-01: 0.25 mg via INTRAVENOUS
  Filled 2017-03-01: qty 5

## 2017-03-01 MED ORDER — HEPARIN SOD (PORK) LOCK FLUSH 100 UNIT/ML IV SOLN
500.0000 [IU] | Freq: Once | INTRAVENOUS | Status: AC
  Administered 2017-03-01: 500 [IU] via INTRAVENOUS

## 2017-03-01 MED ORDER — DEXAMETHASONE SODIUM PHOSPHATE 10 MG/ML IJ SOLN
10.0000 mg | Freq: Once | INTRAMUSCULAR | Status: AC
  Administered 2017-03-01: 10 mg via INTRAVENOUS
  Filled 2017-03-01: qty 1

## 2017-03-01 MED ORDER — SODIUM CHLORIDE 0.9% FLUSH
10.0000 mL | INTRAVENOUS | Status: AC | PRN
  Administered 2017-03-01: 10 mL via INTRAVENOUS
  Filled 2017-03-01: qty 10

## 2017-03-01 MED ORDER — HEPARIN SOD (PORK) LOCK FLUSH 100 UNIT/ML IV SOLN
INTRAVENOUS | Status: AC
  Filled 2017-03-01: qty 5

## 2017-03-01 MED ORDER — SODIUM CHLORIDE 0.9 % IV SOLN
1200.0000 mg | Freq: Once | INTRAVENOUS | Status: AC
  Administered 2017-03-01: 1200 mg via INTRAVENOUS
  Filled 2017-03-01: qty 20

## 2017-03-01 MED ORDER — SODIUM CHLORIDE 0.9 % IV SOLN
200.0000 mg | Freq: Once | INTRAVENOUS | Status: AC
  Administered 2017-03-01: 200 mg via INTRAVENOUS
  Filled 2017-03-01: qty 10

## 2017-03-01 MED ORDER — HEPARIN SOD (PORK) LOCK FLUSH 100 UNIT/ML IV SOLN: 500.0000 [IU] | Freq: Once | INTRAVENOUS | Status: DC | PRN

## 2017-03-01 MED ORDER — SODIUM CHLORIDE 0.9 % IV SOLN: 10.0000 mg | Freq: Once | INTRAVENOUS | Status: DC

## 2017-03-01 MED ORDER — SODIUM CHLORIDE 0.9 % IV SOLN
551.0000 mg | Freq: Once | INTRAVENOUS | Status: AC
  Administered 2017-03-01: 550 mg via INTRAVENOUS
  Filled 2017-03-01: qty 55

## 2017-03-01 NOTE — Progress Notes (Signed)
Patient reports she has been experiencing flare of her COPD, worsening shortness of breath. Patient is unable to get up the stairs at her house.

## 2017-03-02 ENCOUNTER — Inpatient Hospital Stay: Payer: Medicare Other

## 2017-03-02 VITALS — BP 99/67 | HR 85 | Temp 97.0°F | Resp 24

## 2017-03-02 DIAGNOSIS — Z5111 Encounter for antineoplastic chemotherapy: Secondary | ICD-10-CM | POA: Diagnosis not present

## 2017-03-02 DIAGNOSIS — C787 Secondary malignant neoplasm of liver and intrahepatic bile duct: Secondary | ICD-10-CM | POA: Diagnosis not present

## 2017-03-02 DIAGNOSIS — C7951 Secondary malignant neoplasm of bone: Secondary | ICD-10-CM | POA: Diagnosis not present

## 2017-03-02 DIAGNOSIS — Z7689 Persons encountering health services in other specified circumstances: Secondary | ICD-10-CM | POA: Diagnosis not present

## 2017-03-02 DIAGNOSIS — C3490 Malignant neoplasm of unspecified part of unspecified bronchus or lung: Secondary | ICD-10-CM

## 2017-03-02 DIAGNOSIS — Z5112 Encounter for antineoplastic immunotherapy: Secondary | ICD-10-CM | POA: Diagnosis not present

## 2017-03-02 LAB — THYROID PANEL WITH TSH
Free Thyroxine Index: 2.6 (ref 1.2–4.9)
T3 UPTAKE RATIO: 32 % (ref 24–39)
T4 TOTAL: 8 ug/dL (ref 4.5–12.0)
TSH: 1.73 u[IU]/mL (ref 0.450–4.500)

## 2017-03-02 MED ORDER — SODIUM CHLORIDE 0.9% FLUSH
10.0000 mL | INTRAVENOUS | Status: DC | PRN
  Administered 2017-03-02: 10 mL
  Filled 2017-03-02: qty 10

## 2017-03-02 MED ORDER — DEXAMETHASONE SODIUM PHOSPHATE 10 MG/ML IJ SOLN
10.0000 mg | Freq: Once | INTRAMUSCULAR | Status: AC
  Administered 2017-03-02: 10 mg via INTRAVENOUS
  Filled 2017-03-02: qty 1

## 2017-03-02 MED ORDER — SODIUM CHLORIDE 0.9 % IV SOLN: 10.0000 mg | Freq: Once | INTRAVENOUS | Status: DC

## 2017-03-02 MED ORDER — SODIUM CHLORIDE 0.9 % IV SOLN
Freq: Once | INTRAVENOUS | Status: AC
Start: 2017-03-02 — End: 2017-03-02
  Administered 2017-03-02: 10:00:00 via INTRAVENOUS
  Filled 2017-03-02: qty 1000

## 2017-03-02 MED ORDER — HEPARIN SOD (PORK) LOCK FLUSH 100 UNIT/ML IV SOLN
500.0000 [IU] | Freq: Once | INTRAVENOUS | Status: AC | PRN
  Administered 2017-03-02: 500 [IU]
  Filled 2017-03-02: qty 5

## 2017-03-02 MED ORDER — SODIUM CHLORIDE 0.9 % IV SOLN
200.0000 mg | Freq: Once | INTRAVENOUS | Status: AC
  Administered 2017-03-02: 200 mg via INTRAVENOUS
  Filled 2017-03-02: qty 10

## 2017-03-03 ENCOUNTER — Inpatient Hospital Stay: Payer: Medicare Other

## 2017-03-03 DIAGNOSIS — Z5112 Encounter for antineoplastic immunotherapy: Secondary | ICD-10-CM | POA: Diagnosis not present

## 2017-03-03 DIAGNOSIS — Z7689 Persons encountering health services in other specified circumstances: Secondary | ICD-10-CM | POA: Diagnosis not present

## 2017-03-03 DIAGNOSIS — C3490 Malignant neoplasm of unspecified part of unspecified bronchus or lung: Secondary | ICD-10-CM | POA: Diagnosis not present

## 2017-03-03 DIAGNOSIS — C7951 Secondary malignant neoplasm of bone: Secondary | ICD-10-CM | POA: Diagnosis not present

## 2017-03-03 DIAGNOSIS — Z5111 Encounter for antineoplastic chemotherapy: Secondary | ICD-10-CM | POA: Diagnosis not present

## 2017-03-03 DIAGNOSIS — C787 Secondary malignant neoplasm of liver and intrahepatic bile duct: Secondary | ICD-10-CM | POA: Diagnosis not present

## 2017-03-03 MED ORDER — SODIUM CHLORIDE 0.9 % IV SOLN
Freq: Once | INTRAVENOUS | Status: AC
  Administered 2017-03-03: 10:00:00 via INTRAVENOUS
  Filled 2017-03-03: qty 1000

## 2017-03-03 MED ORDER — SODIUM CHLORIDE 0.9 % IV SOLN: 10.0000 mg | Freq: Once | INTRAVENOUS | Status: DC

## 2017-03-03 MED ORDER — SODIUM CHLORIDE 0.9 % IV SOLN
200.0000 mg | Freq: Once | INTRAVENOUS | Status: AC
  Administered 2017-03-03: 200 mg via INTRAVENOUS
  Filled 2017-03-03: qty 10

## 2017-03-03 MED ORDER — PEGFILGRASTIM 6 MG/0.6ML ~~LOC~~ PSKT
6.0000 mg | PREFILLED_SYRINGE | Freq: Once | SUBCUTANEOUS | Status: AC
  Administered 2017-03-03: 6 mg via SUBCUTANEOUS
  Filled 2017-03-03: qty 0.6

## 2017-03-03 MED ORDER — HEPARIN SOD (PORK) LOCK FLUSH 100 UNIT/ML IV SOLN
500.0000 [IU] | Freq: Once | INTRAVENOUS | Status: AC | PRN
  Administered 2017-03-03: 500 [IU]
  Filled 2017-03-03 (×2): qty 5

## 2017-03-03 MED ORDER — DEXAMETHASONE SODIUM PHOSPHATE 10 MG/ML IJ SOLN
10.0000 mg | Freq: Once | INTRAMUSCULAR | Status: AC
  Administered 2017-03-03: 10 mg via INTRAVENOUS
  Filled 2017-03-03: qty 1

## 2017-03-08 DIAGNOSIS — Z515 Encounter for palliative care: Secondary | ICD-10-CM | POA: Diagnosis not present

## 2017-03-08 DIAGNOSIS — C7951 Secondary malignant neoplasm of bone: Secondary | ICD-10-CM | POA: Diagnosis not present

## 2017-03-08 DIAGNOSIS — C3492 Malignant neoplasm of unspecified part of left bronchus or lung: Secondary | ICD-10-CM | POA: Diagnosis not present

## 2017-03-08 DIAGNOSIS — J449 Chronic obstructive pulmonary disease, unspecified: Secondary | ICD-10-CM | POA: Diagnosis not present

## 2017-03-08 DIAGNOSIS — C787 Secondary malignant neoplasm of liver and intrahepatic bile duct: Secondary | ICD-10-CM | POA: Diagnosis not present

## 2017-03-08 DIAGNOSIS — R0600 Dyspnea, unspecified: Secondary | ICD-10-CM | POA: Diagnosis not present

## 2017-03-08 DIAGNOSIS — R52 Pain, unspecified: Secondary | ICD-10-CM | POA: Diagnosis not present

## 2017-03-22 ENCOUNTER — Inpatient Hospital Stay: Payer: Medicare Other | Attending: Oncology

## 2017-03-22 ENCOUNTER — Inpatient Hospital Stay: Payer: Medicare Other

## 2017-03-22 ENCOUNTER — Ambulatory Visit
Admission: RE | Admit: 2017-03-22 | Discharge: 2017-03-22 | Disposition: A | Discharge status: Home / Self Care | Payer: Medicare Other | Source: Ambulatory Visit | Attending: Oncology | Admitting: Oncology

## 2017-03-22 ENCOUNTER — Inpatient Hospital Stay (HOSPITAL_BASED_OUTPATIENT_CLINIC_OR_DEPARTMENT_OTHER): Payer: Medicare Other | Admitting: Oncology

## 2017-03-22 VITALS — BP 124/81 | HR 123 | Temp 97.2°F | Resp 20

## 2017-03-22 VITALS — HR 107

## 2017-03-22 DIAGNOSIS — I509 Heart failure, unspecified: Secondary | ICD-10-CM

## 2017-03-22 DIAGNOSIS — K219 Gastro-esophageal reflux disease without esophagitis: Secondary | ICD-10-CM

## 2017-03-22 DIAGNOSIS — R062 Wheezing: Secondary | ICD-10-CM

## 2017-03-22 DIAGNOSIS — J449 Chronic obstructive pulmonary disease, unspecified: Secondary | ICD-10-CM

## 2017-03-22 DIAGNOSIS — R000 Tachycardia, unspecified: Secondary | ICD-10-CM | POA: Diagnosis not present

## 2017-03-22 DIAGNOSIS — E785 Hyperlipidemia, unspecified: Secondary | ICD-10-CM

## 2017-03-22 DIAGNOSIS — C7951 Secondary malignant neoplasm of bone: Secondary | ICD-10-CM

## 2017-03-22 DIAGNOSIS — Z7952 Long term (current) use of systemic steroids: Secondary | ICD-10-CM

## 2017-03-22 DIAGNOSIS — I700 Atherosclerosis of aorta: Secondary | ICD-10-CM

## 2017-03-22 DIAGNOSIS — C3490 Malignant neoplasm of unspecified part of unspecified bronchus or lung: Secondary | ICD-10-CM

## 2017-03-22 DIAGNOSIS — D709 Neutropenia, unspecified: Secondary | ICD-10-CM

## 2017-03-22 DIAGNOSIS — D649 Anemia, unspecified: Secondary | ICD-10-CM | POA: Diagnosis not present

## 2017-03-22 DIAGNOSIS — R918 Other nonspecific abnormal finding of lung field: Secondary | ICD-10-CM | POA: Diagnosis not present

## 2017-03-22 DIAGNOSIS — Z79899 Other long term (current) drug therapy: Secondary | ICD-10-CM

## 2017-03-22 DIAGNOSIS — R630 Anorexia: Secondary | ICD-10-CM | POA: Diagnosis not present

## 2017-03-22 DIAGNOSIS — R05 Cough: Secondary | ICD-10-CM | POA: Diagnosis not present

## 2017-03-22 DIAGNOSIS — Z5111 Encounter for antineoplastic chemotherapy: Secondary | ICD-10-CM

## 2017-03-22 DIAGNOSIS — F419 Anxiety disorder, unspecified: Secondary | ICD-10-CM

## 2017-03-22 DIAGNOSIS — C787 Secondary malignant neoplasm of liver and intrahepatic bile duct: Secondary | ICD-10-CM | POA: Diagnosis not present

## 2017-03-22 DIAGNOSIS — G8929 Other chronic pain: Secondary | ICD-10-CM | POA: Diagnosis not present

## 2017-03-22 DIAGNOSIS — I10 Essential (primary) hypertension: Secondary | ICD-10-CM

## 2017-03-22 DIAGNOSIS — R0602 Shortness of breath: Secondary | ICD-10-CM | POA: Diagnosis not present

## 2017-03-22 DIAGNOSIS — Z87891 Personal history of nicotine dependence: Secondary | ICD-10-CM

## 2017-03-22 DIAGNOSIS — I429 Cardiomyopathy, unspecified: Secondary | ICD-10-CM

## 2017-03-22 DIAGNOSIS — F329 Major depressive disorder, single episode, unspecified: Secondary | ICD-10-CM

## 2017-03-22 LAB — COMPREHENSIVE METABOLIC PANEL
ALBUMIN: 2.9 g/dL — AB (ref 3.5–5.0)
ALK PHOS: 124 U/L (ref 38–126)
ALT: 19 U/L (ref 14–54)
ANION GAP: 11 (ref 5–15)
AST: 24 U/L (ref 15–41)
BILIRUBIN TOTAL: 0.4 mg/dL (ref 0.3–1.2)
BUN: 13 mg/dL (ref 6–20)
CALCIUM: 7.9 mg/dL — AB (ref 8.9–10.3)
CO2: 19 mmol/L — ABNORMAL LOW (ref 22–32)
Chloride: 107 mmol/L (ref 101–111)
Creatinine, Ser: 0.85 mg/dL (ref 0.44–1.00)
GFR calc Af Amer: >60 mL/min (ref >60)
Glucose, Bld: 218 mg/dL — ABNORMAL HIGH (ref 65–99)
POTASSIUM: 3.5 mmol/L (ref 3.5–5.1)
Sodium: 137 mmol/L (ref 135–145)
TOTAL PROTEIN: 7.2 g/dL (ref 6.5–8.1)

## 2017-03-22 LAB — CBC WITH DIFFERENTIAL/PLATELET
BASOS PCT: 0 %
Basophils Absolute: 0.2 10*3/uL — ABNORMAL HIGH (ref 0–0.1)
Eosinophils Absolute: 0 10*3/uL (ref 0–0.7)
Eosinophils Relative: 0 %
HCT: 25.9 % — ABNORMAL LOW (ref 35.0–47.0)
HEMOGLOBIN: 8.3 g/dL — AB (ref 12.0–16.0)
LYMPHS ABS: 1 10*3/uL (ref 1.0–3.6)
LYMPHS PCT: 3 %
MCH: 30.4 pg (ref 26.0–34.0)
MCHC: 32 g/dL (ref 32.0–36.0)
MCV: 94.9 fL (ref 80.0–100.0)
MONO ABS: 1.3 10*3/uL — AB (ref 0.2–0.9)
Monocytes Relative: 3 %
NEUTROS PCT: 94 %
Neutro Abs: 37.7 10*3/uL — ABNORMAL HIGH (ref 1.4–6.5)
Platelets: 324 10*3/uL (ref 150–440)
RBC: 2.72 MIL/uL — ABNORMAL LOW (ref 3.80–5.20)
RDW: 15.9 % — AB (ref 11.5–14.5)
WBC: 40.2 10*3/uL — ABNORMAL HIGH (ref 3.6–11.0)

## 2017-03-22 MED ORDER — OXYCODONE-ACETAMINOPHEN 5-325 MG PO TABS: 1.0000 | ORAL_TABLET | Freq: Four times a day (QID) | ORAL | 0 refills | Status: DC | PRN

## 2017-03-22 MED ORDER — ALBUTEROL SULFATE (2.5 MG/3ML) 0.083% IN NEBU
2.5000 mg | INHALATION_SOLUTION | Freq: Once | RESPIRATORY_TRACT | Status: AC
  Administered 2017-03-22: 2.5 mg via RESPIRATORY_TRACT
  Filled 2017-03-22: qty 3

## 2017-03-22 MED ORDER — SODIUM CHLORIDE 0.9 % IV SOLN
545.0000 mg | Freq: Once | INTRAVENOUS | Status: AC
  Administered 2017-03-22: 550 mg via INTRAVENOUS
  Filled 2017-03-22: qty 55

## 2017-03-22 MED ORDER — ZOLEDRONIC ACID 4 MG/100ML IV SOLN
4.0000 mg | Freq: Once | INTRAVENOUS | Status: AC
  Administered 2017-03-22: 4 mg via INTRAVENOUS
  Filled 2017-03-22: qty 100

## 2017-03-22 MED ORDER — SODIUM CHLORIDE 0.9 % IV SOLN: 10.0000 mg | Freq: Once | INTRAVENOUS | Status: DC

## 2017-03-22 MED ORDER — DEXAMETHASONE SODIUM PHOSPHATE 10 MG/ML IJ SOLN
10.0000 mg | Freq: Once | INTRAMUSCULAR | Status: AC
  Administered 2017-03-22: 10 mg via INTRAVENOUS
  Filled 2017-03-22: qty 1

## 2017-03-22 MED ORDER — SODIUM CHLORIDE 0.9 % IV SOLN
1200.0000 mg | Freq: Once | INTRAVENOUS | Status: DC
  Filled 2017-03-22: qty 20

## 2017-03-22 MED ORDER — SODIUM CHLORIDE 0.9 % IV SOLN
Freq: Once | INTRAVENOUS | Status: DC
  Filled 2017-03-22: qty 1000

## 2017-03-22 MED ORDER — SODIUM CHLORIDE 0.9 % IV SOLN
200.0000 mg | Freq: Once | INTRAVENOUS | Status: AC
  Administered 2017-03-22: 200 mg via INTRAVENOUS
  Filled 2017-03-22: qty 10

## 2017-03-22 MED ORDER — HEPARIN SOD (PORK) LOCK FLUSH 100 UNIT/ML IV SOLN
500.0000 [IU] | Freq: Once | INTRAVENOUS | Status: DC | PRN
  Filled 2017-03-22: qty 5

## 2017-03-22 MED ORDER — PALONOSETRON HCL INJECTION 0.25 MG/5ML
0.2500 mg | Freq: Once | INTRAVENOUS | Status: AC
  Administered 2017-03-22: 0.25 mg via INTRAVENOUS
  Filled 2017-03-22: qty 5

## 2017-03-22 MED ORDER — SODIUM CHLORIDE 0.9% FLUSH
10.0000 mL | Freq: Once | INTRAVENOUS | Status: AC
  Administered 2017-03-22: 10 mL via INTRAVENOUS
  Filled 2017-03-22: qty 10

## 2017-03-22 MED ORDER — METOPROLOL TARTRATE 100 MG PO TABS
100.0000 mg | ORAL_TABLET | Freq: Once | ORAL | Status: AC
  Administered 2017-03-22: 100 mg via ORAL
  Filled 2017-03-22: qty 1

## 2017-03-22 MED ORDER — SODIUM CHLORIDE 0.9% FLUSH
10.0000 mL | INTRAVENOUS | Status: DC | PRN
  Filled 2017-03-22: qty 10

## 2017-03-22 MED ORDER — HEPARIN SOD (PORK) LOCK FLUSH 100 UNIT/ML IV SOLN
500.0000 [IU] | Freq: Once | INTRAVENOUS | Status: AC
  Administered 2017-03-22: 500 [IU] via INTRAVENOUS

## 2017-03-22 MED ORDER — SODIUM CHLORIDE 0.9 % IV SOLN
Freq: Once | INTRAVENOUS | Status: AC
  Administered 2017-03-22: 10:00:00 via INTRAVENOUS
  Filled 2017-03-22: qty 1000

## 2017-03-22 NOTE — Progress Notes (Signed)
Patient denies any concerns today.  

## 2017-03-22 NOTE — Progress Notes (Signed)
Patient was having SOB and Cough so I wanted to rule out pneumonitis and ordered a chest X-ray. Should we order a CT scan as the radiologist suggests to see if she is progressing on treatment. We held her Tecentriq today. Was going to give tomorrow. Thanks.

## 2017-03-22 NOTE — Progress Notes (Signed)
Per Tillie Rung RN per Faythe Casa NP okay to treat with elevated HR. Additional orders placed.   1100: Per Faythe Casa, Tecentriq to be held at this time, proceed with Carboplatin and Etoposide and pt to have chest Xray after treatment today. Pt verbalizes understanding.

## 2017-03-22 NOTE — Progress Notes (Signed)
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  INTERVAL HISTORY: Patient returns to clinic today for further evaluation and consideration of cycle 3-day 1 of carboplatinum, etoposide and Tecentriq.  Patient states she feels she is about to have a COPD flare again.  She admits to coughing up yellow/greenish sputum.  She has not felt like eating and continues to eat 1 meal daily. She states she feels short of breath and wheezy.  She unfortunately has been fighting with the insurance company to receive an at home nebulizer to help with this.  She  uses her inhalers with some relief.  She continues to have chronic pain and is seen in the pain clinic on a regular basis  but otherwise feels well.  She offers no neurological complaints or recent fevers or illnesses.  She denies weight loss.  She denies any chest pain or hemoptysis.  She denies any nausea, vomiting, constipation or diarrhea.  She denies any abdominal pain, melena or hematochezia.  She has no urinary complaints.  REVIEW OF SYSTEMS:   Review of Systems  Constitutional: Negative.  Negative for fever, malaise/fatigue and weight loss.  Respiratory: Positive for cough, sputum production (yellow/green), shortness of breath and wheezing. Negative for hemoptysis.   Cardiovascular: Negative.  Negative for chest pain and leg swelling.  Gastrointestinal: Negative.  Negative for abdominal pain, blood in stool and melena.  Genitourinary: Negative.   Musculoskeletal: Negative for back pain, joint pain and neck pain.  Skin: Negative.  Negative for rash.  Neurological: Negative.  Negative for sensory change and weakness.  Psychiatric/Behavioral: Negative.  The  patient is not nervous/anxious.     As per HPI. Otherwise, a complete review of systems is negative.  PAST MEDICAL HISTORY: Past Medical History:  Diagnosis Date  . Anxiety   . Arthritis   . Asthma    COPD  . Cancer ([place])    Liver  . CAP (community acquired pneumonia) 06/30/2014  . Cardiomyopathy ([place])    nonischemic (EF 35-40%)  . CHF (congestive heart failure) ([place])   . COPD (chronic obstructive pulmonary disease) ([place])   . Depression   . Dyspnea   . GERD (gastroesophageal reflux disease)   . Hyperlipidemia   . Hypertension   . Pneumonia   . PVC's (premature ventricular contractions)    states 10 years ago  . Sepsis ([place]) 06/30/2014  . SOB (shortness of breath) 05/08/2014  . Spinal cord injury at T7-T12 level [facility])    2016    PAST SURGICAL HISTORY: Past Surgical History:  Procedure Laterality Date  . APPENDECTOMY    . CARDIAC CATHETERIZATION    . CATARACT EXTRACTION     right eye   . CESAREAN SECTION    . COLONOSCOPY    . LAMINOTOMY    . LUMBAR LAMINECTOMY/DECOMPRESSION MICRODISCECTOMY Left 08/01/2016   Procedure: Left Lumbar Four-Five Laminectomy and Foraminotomy;  Surgeon: Earnie Larsson, MD;  Location: [facility];  Service: Neurosurgery;  Laterality: Left;  . PORTA CATH INSERTION N/A 02/03/2017   Procedure: PORTA CATH INSERTION;  Surgeon: Katha Cabal, MD;  Location: [place] [facility];  Service: Cardiovascular;  Laterality: N/A;    FAMILY HISTORY: Family History  Problem Relation Age of Onset  .  Heart attack Father 76       MI x 3     ADVANCED DIRECTIVES (Y/N):  N  HEALTH MAINTENANCE: Social History   Tobacco Use  . Smoking status: Former Smoker    Packs/day: 0.50    Years: 50.00    Pack years: 25.00    Types: Cigarettes    Last attempt to quit: 01/27/2015    Years since quitting: 2.1  . Smokeless tobacco: Never Used  Substance Use Topics  . Alcohol use: No  . Drug use: No     Colonoscopy:  PAP:  Bone density:  Lipid panel:  Allergies    Allergen Reactions  . Iodine Anaphylaxis    IVP dye  . Fentanyl     Overly sedated and groggy, ineffective     Current Outpatient Medications  Medication Sig Dispense Refill  . albuterol (PROAIR HFA) 108 (90 Base) MCG/ACT inhaler Inhale 2 puffs into the lungs every 6 (six) hours as needed for wheezing or shortness of breath.     Marland Kitchen alendronate (FOSAMAX) 70 MG tablet Take 70 mg by mouth every Thursday.     . ARIPiprazole (ABILIFY) 5 MG tablet Take 5 mg by mouth daily.    . Calcium Citrate-Vitamin D (CALCIUM + D PO) Take 1 tablet by mouth 2 (two) times daily.    . DULoxetine (CYMBALTA) 60 MG capsule Take 60 mg by mouth daily.    Marland Kitchen lidocaine-prilocaine (EMLA) cream Apply to affected area once 30 g 3  . LORazepam (ATIVAN) 0.5 MG tablet Take 0.5 mg by mouth daily as needed for anxiety.     . metoprolol succinate (TOPROL-XL) 100 MG 24 hr tablet TAKE ONE TABLET BY MOUTH ONCE DAILY 30 tablet 0  . Omega-3 Fatty Acids (FISH OIL) 1000 MG CAPS Take 1,000 mg by mouth daily.    . ondansetron (ZOFRAN) 8 MG tablet Take 1 tablet (8 mg total) by mouth 2 (two) times daily as needed for refractory nausea / vomiting. 30 tablet 2  . oxyCODONE (OXYCONTIN) 20 mg 12 hr tablet Take 1 tablet (20 mg total) by mouth every 12 (twelve) hours. 60 tablet 0  . oxyCODONE-acetaminophen (PERCOCET/ROXICET) 5-325 MG tablet Take 1-2 tablets by mouth every 6 (six) hours as needed. For pain. 120 tablet 0  . potassium chloride SA (K-DUR,KLOR-CON) 20 MEQ tablet Take 1 tablet (20 mEq total) by mouth 2 (two) times daily. 60 tablet 1  . predniSONE (DELTASONE) 20 MG tablet Take 2 tablets (40 mg total) by mouth daily. 10 tablet 0  . prochlorperazine (COMPAZINE) 10 MG tablet Take 1 tablet (10 mg total) by mouth every 6 (six) hours as needed (Nausea or vomiting). 60 tablet 2  . promethazine (PHENERGAN) 25 MG tablet Take 1 tablet (25 mg total) by mouth every 6 (six) hours as needed for nausea or vomiting. 30 tablet 2  . sacubitril-valsartan  (ENTRESTO) 49-51 MG Take 1 tablet by mouth 2 (two) times daily. 60 tablet 6  . ipratropium-albuterol (DUONEB) 0.5-2.5 (3) MG/3ML SOLN Take 3 mLs by nebulization every 4 (four) hours as needed. 360 mL 1   No current facility-administered medications for this visit.    Facility-Administered Medications Ordered in Other Visits  Medication Dose Route Frequency Provider Last Rate Last Dose  . sodium chloride flush (NS) 0.9 % injection 10 mL  10 mL Intravenous PRN Lloyd Huger, MD   10 mL at 03/01/17 0841    OBJECTIVE: Vitals:   03/22/17 0916  BP: 124/81  Pulse: Marland Kitchen)  123  Resp: 20  Temp: (!) 97.2 F (36.2 C)     There is no height or weight on file to calculate BMI.    ECOG FS:2 - Symptomatic, <50% confined to bed  General: Well-developed, well-nourished, no acute distress.  Sitting in a wheelchair. Eyes: Pink conjunctiva, anicteric sclera. Lungs: Diminished breath sounds bilaterally, scattered wheezing. Heart: Regular rate and rhythm. No rubs, murmurs, or gallops. Abdomen: Soft, nontender, nondistended. No organomegaly noted, normoactive bowel sounds. Musculoskeletal: No edema, cyanosis, or clubbing. Neuro: Alert, answering all questions appropriately. Cranial nerves grossly intact. Skin: No rashes or petechiae noted. Psych: Normal affect.   LAB RESULTS:  Lab Results  Component Value Date   NA 137 03/22/2017   K 3.5 03/22/2017   CL 107 03/22/2017   CO2 19 (L) 03/22/2017   GLUCOSE 218 (H) 03/22/2017   BUN 13 03/22/2017   CREATININE 0.85 03/22/2017   CALCIUM 7.9 (L) 03/22/2017   PROT 7.2 03/22/2017   ALBUMIN 2.9 (L) 03/22/2017   AST 24 03/22/2017   ALT 19 03/22/2017   ALKPHOS 124 03/22/2017   BILITOT 0.4 03/22/2017   GFRNONAA >60 03/22/2017   GFRAA >60 03/22/2017    Lab Results  Component Value Date   WBC 40.2 (H) 03/22/2017   NEUTROABS 37.7 (H) 03/22/2017   HGB 8.3 (L) 03/22/2017   HCT 25.9 (L) 03/22/2017   MCV 94.9 03/22/2017   PLT 324 03/22/2017      STUDIES: Dg Chest 2 View  Result Date: 03/22/2017 CLINICAL DATA:  Shortness of breath.  Cough. EXAM: CHEST  2 VIEW COMPARISON:  Radiograph of February 20, 2017. PET scan of February 06, 2017. FINDINGS: Stable cardiomediastinal silhouette. Atherosclerosis of thoracic aorta is noted. No pneumothorax or pleural effusion is noted. Right internal jugular Port-A-Cath is unchanged in position. Interval development of bilateral pulmonary nodules are noted concerning for metastatic disease. Bony thorax is unremarkable. IMPRESSION: Aortic atherosclerosis. Interval development of bilateral pulmonary nodules is noted concerning for metastatic disease. CT scan of the chest is recommended for further evaluation. Electronically Signed   By: Marijo Conception, M.D.   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  PLAN:  1. 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  MRI of the brain on February 04, 2017 did not reveal metastatic disease.  Okay to proceed with cycle 3-day 1 of carboplatinum and etoposide.  She will return for etoposide only on day 2 and day 3.  We will hold Tecentriq today given her progressive shortness of breath, wheezing and sputum production and get a stat chest x-ray to rule out infection versus pneumonitis verse COPD.  We will plan to give Tecentriq with day 2 etoposide if clear.  Plan per Dr. Gary Fleet notes include a total of 4 treatments with Neulasta support every 21 days and then to reimage with a PET scan.  Stat chest x-ray showed interval development of bilateral pulmonary nodules concerning for metastatic disease.  It was recommended to schedule CT scan of the chest for further evaluation.  We will hold Tecentriq altogether and schedule CT of chest ASAP.  Patient is aware of findings. RTC 2.  Bone health: Patient  will receive Zometa today and every odd cycle of treatment. 3. Shortness of breath: We will give nebulizer during treatment today X 2.  4.  Neutropenia: Neulasta versus  active infection.  Patient has been afebrile.  Stat chest x-ray. 5.  Pain: Refilled narcotics. 6.  Anemia: We will continue to keep an eye on this.  Likely secondary to chemotherapy. 7.  Wheezing: Nebulizer during treatment X 2.  May need steroid taper.  We will hold off right now given she is on immunotherapy.  8.  Tachycardia: HR 123. Patient did not take her metoprolol this morning.  We will give her 5 mg IV and recheck heart rate prior to administration of nebulizer or chemotherapy.  Heart rate came down with 5 mg of metoprolol to below 100.   Approximately 30 minutes was spent in discussion of which greater than 50% was consultation.  Patient expressed understanding and was in agreement with this plan. She also understands that She can call clinic at any time with any questions, concerns, or complaints.   Cancer Staging Small cell lung cancer [facility]) Staging form: Lung, AJCC 8th Edition - Clinical stage from 01/28/2017: Stage IV (cT4, cN3, pM1c) - Signed by Lloyd Huger, MD on 01/28/2017   Grace Hawking, NP   03/27/2017 1:12 PM

## 2017-03-23 ENCOUNTER — Inpatient Hospital Stay: Payer: Medicare Other

## 2017-03-23 ENCOUNTER — Other Ambulatory Visit (HOSPITAL_COMMUNITY): Payer: Self-pay | Admitting: Oncology

## 2017-03-23 VITALS — BP 93/54 | HR 67 | Temp 97.4°F | Resp 20

## 2017-03-23 DIAGNOSIS — D696 Thrombocytopenia, unspecified: Secondary | ICD-10-CM | POA: Diagnosis not present

## 2017-03-23 DIAGNOSIS — I700 Atherosclerosis of aorta: Secondary | ICD-10-CM | POA: Insufficient documentation

## 2017-03-23 DIAGNOSIS — M47816 Spondylosis without myelopathy or radiculopathy, lumbar region: Secondary | ICD-10-CM | POA: Insufficient documentation

## 2017-03-23 DIAGNOSIS — C7951 Secondary malignant neoplasm of bone: Secondary | ICD-10-CM | POA: Insufficient documentation

## 2017-03-23 DIAGNOSIS — K828 Other specified diseases of gallbladder: Secondary | ICD-10-CM | POA: Diagnosis not present

## 2017-03-23 DIAGNOSIS — J449 Chronic obstructive pulmonary disease, unspecified: Secondary | ICD-10-CM | POA: Diagnosis not present

## 2017-03-23 DIAGNOSIS — Z87891 Personal history of nicotine dependence: Secondary | ICD-10-CM | POA: Insufficient documentation

## 2017-03-23 DIAGNOSIS — R05 Cough: Secondary | ICD-10-CM | POA: Diagnosis not present

## 2017-03-23 DIAGNOSIS — I429 Cardiomyopathy, unspecified: Secondary | ICD-10-CM | POA: Diagnosis not present

## 2017-03-23 DIAGNOSIS — I509 Heart failure, unspecified: Secondary | ICD-10-CM | POA: Insufficient documentation

## 2017-03-23 DIAGNOSIS — R062 Wheezing: Secondary | ICD-10-CM | POA: Diagnosis not present

## 2017-03-23 DIAGNOSIS — N179 Acute kidney failure, unspecified: Secondary | ICD-10-CM | POA: Insufficient documentation

## 2017-03-23 DIAGNOSIS — I110 Hypertensive heart disease with heart failure: Secondary | ICD-10-CM | POA: Diagnosis not present

## 2017-03-23 DIAGNOSIS — Z79899 Other long term (current) drug therapy: Secondary | ICD-10-CM | POA: Insufficient documentation

## 2017-03-23 DIAGNOSIS — D709 Neutropenia, unspecified: Secondary | ICD-10-CM | POA: Diagnosis not present

## 2017-03-23 DIAGNOSIS — K838 Other specified diseases of biliary tract: Secondary | ICD-10-CM | POA: Insufficient documentation

## 2017-03-23 DIAGNOSIS — Z5111 Encounter for antineoplastic chemotherapy: Secondary | ICD-10-CM | POA: Diagnosis not present

## 2017-03-23 DIAGNOSIS — F419 Anxiety disorder, unspecified: Secondary | ICD-10-CM | POA: Insufficient documentation

## 2017-03-23 DIAGNOSIS — K219 Gastro-esophageal reflux disease without esophagitis: Secondary | ICD-10-CM | POA: Insufficient documentation

## 2017-03-23 DIAGNOSIS — C3490 Malignant neoplasm of unspecified part of unspecified bronchus or lung: Secondary | ICD-10-CM | POA: Diagnosis not present

## 2017-03-23 DIAGNOSIS — R000 Tachycardia, unspecified: Secondary | ICD-10-CM | POA: Diagnosis not present

## 2017-03-23 DIAGNOSIS — C787 Secondary malignant neoplasm of liver and intrahepatic bile duct: Secondary | ICD-10-CM | POA: Insufficient documentation

## 2017-03-23 DIAGNOSIS — D649 Anemia, unspecified: Secondary | ICD-10-CM | POA: Diagnosis not present

## 2017-03-23 DIAGNOSIS — R630 Anorexia: Secondary | ICD-10-CM | POA: Insufficient documentation

## 2017-03-23 DIAGNOSIS — K5730 Diverticulosis of large intestine without perforation or abscess without bleeding: Secondary | ICD-10-CM | POA: Diagnosis not present

## 2017-03-23 DIAGNOSIS — G8929 Other chronic pain: Secondary | ICD-10-CM | POA: Diagnosis not present

## 2017-03-23 DIAGNOSIS — R4182 Altered mental status, unspecified: Secondary | ICD-10-CM | POA: Insufficient documentation

## 2017-03-23 DIAGNOSIS — F329 Major depressive disorder, single episode, unspecified: Secondary | ICD-10-CM | POA: Insufficient documentation

## 2017-03-23 DIAGNOSIS — Z7952 Long term (current) use of systemic steroids: Secondary | ICD-10-CM | POA: Insufficient documentation

## 2017-03-23 DIAGNOSIS — E785 Hyperlipidemia, unspecified: Secondary | ICD-10-CM | POA: Insufficient documentation

## 2017-03-23 DIAGNOSIS — E871 Hypo-osmolality and hyponatremia: Secondary | ICD-10-CM | POA: Insufficient documentation

## 2017-03-23 LAB — THYROID PANEL WITH TSH
Free Thyroxine Index: 2.3 (ref 1.2–4.9)
T3 Uptake Ratio: 32 % (ref 24–39)
T4, Total: 7.2 ug/dL (ref 4.5–12.0)
TSH: 1.47 u[IU]/mL (ref 0.450–4.500)

## 2017-03-23 MED ORDER — DEXAMETHASONE SODIUM PHOSPHATE 10 MG/ML IJ SOLN
10.0000 mg | Freq: Once | INTRAMUSCULAR | Status: AC
  Administered 2017-03-23: 10 mg via INTRAVENOUS
  Filled 2017-03-23: qty 1

## 2017-03-23 MED ORDER — SODIUM CHLORIDE 0.9 % IV SOLN
Freq: Once | INTRAVENOUS | Status: AC
  Administered 2017-03-23: 09:00:00 via INTRAVENOUS
  Filled 2017-03-23: qty 1000

## 2017-03-23 MED ORDER — SODIUM CHLORIDE 0.9% FLUSH
10.0000 mL | INTRAVENOUS | Status: DC | PRN
  Administered 2017-03-23: 10 mL
  Filled 2017-03-23: qty 10

## 2017-03-23 MED ORDER — SODIUM CHLORIDE 0.9 % IV SOLN
200.0000 mg | Freq: Once | INTRAVENOUS | Status: AC
  Administered 2017-03-23: 200 mg via INTRAVENOUS
  Filled 2017-03-23: qty 10

## 2017-03-23 MED ORDER — HEPARIN SOD (PORK) LOCK FLUSH 100 UNIT/ML IV SOLN
500.0000 [IU] | Freq: Once | INTRAVENOUS | Status: AC | PRN
  Administered 2017-03-23: 500 [IU]
  Filled 2017-03-23: qty 5

## 2017-03-24 ENCOUNTER — Inpatient Hospital Stay (HOSPITAL_BASED_OUTPATIENT_CLINIC_OR_DEPARTMENT_OTHER): Payer: Medicare Other | Admitting: Oncology

## 2017-03-24 ENCOUNTER — Inpatient Hospital Stay: Payer: Medicare Other

## 2017-03-24 ENCOUNTER — Other Ambulatory Visit: Payer: Self-pay | Admitting: Oncology

## 2017-03-24 VITALS — HR 69

## 2017-03-24 DIAGNOSIS — I429 Cardiomyopathy, unspecified: Secondary | ICD-10-CM

## 2017-03-24 DIAGNOSIS — C3490 Malignant neoplasm of unspecified part of unspecified bronchus or lung: Secondary | ICD-10-CM | POA: Diagnosis not present

## 2017-03-24 DIAGNOSIS — R000 Tachycardia, unspecified: Secondary | ICD-10-CM

## 2017-03-24 DIAGNOSIS — F419 Anxiety disorder, unspecified: Secondary | ICD-10-CM | POA: Diagnosis not present

## 2017-03-24 DIAGNOSIS — R630 Anorexia: Secondary | ICD-10-CM | POA: Diagnosis not present

## 2017-03-24 DIAGNOSIS — Z79899 Other long term (current) drug therapy: Secondary | ICD-10-CM | POA: Diagnosis not present

## 2017-03-24 DIAGNOSIS — C7951 Secondary malignant neoplasm of bone: Secondary | ICD-10-CM

## 2017-03-24 DIAGNOSIS — F329 Major depressive disorder, single episode, unspecified: Secondary | ICD-10-CM

## 2017-03-24 DIAGNOSIS — Z5111 Encounter for antineoplastic chemotherapy: Secondary | ICD-10-CM | POA: Diagnosis not present

## 2017-03-24 DIAGNOSIS — G8929 Other chronic pain: Secondary | ICD-10-CM | POA: Diagnosis not present

## 2017-03-24 DIAGNOSIS — D649 Anemia, unspecified: Secondary | ICD-10-CM | POA: Diagnosis not present

## 2017-03-24 DIAGNOSIS — J449 Chronic obstructive pulmonary disease, unspecified: Secondary | ICD-10-CM

## 2017-03-24 DIAGNOSIS — I700 Atherosclerosis of aorta: Secondary | ICD-10-CM

## 2017-03-24 DIAGNOSIS — C787 Secondary malignant neoplasm of liver and intrahepatic bile duct: Secondary | ICD-10-CM

## 2017-03-24 DIAGNOSIS — I509 Heart failure, unspecified: Secondary | ICD-10-CM

## 2017-03-24 DIAGNOSIS — K219 Gastro-esophageal reflux disease without esophagitis: Secondary | ICD-10-CM

## 2017-03-24 DIAGNOSIS — Z7952 Long term (current) use of systemic steroids: Secondary | ICD-10-CM

## 2017-03-24 DIAGNOSIS — R4182 Altered mental status, unspecified: Secondary | ICD-10-CM | POA: Diagnosis not present

## 2017-03-24 DIAGNOSIS — R0602 Shortness of breath: Secondary | ICD-10-CM

## 2017-03-24 DIAGNOSIS — E785 Hyperlipidemia, unspecified: Secondary | ICD-10-CM

## 2017-03-24 DIAGNOSIS — I10 Essential (primary) hypertension: Secondary | ICD-10-CM

## 2017-03-24 DIAGNOSIS — Z87891 Personal history of nicotine dependence: Secondary | ICD-10-CM

## 2017-03-24 MED ORDER — DEXAMETHASONE SODIUM PHOSPHATE 10 MG/ML IJ SOLN
10.0000 mg | Freq: Once | INTRAMUSCULAR | Status: AC
  Administered 2017-03-24: 10 mg via INTRAVENOUS
  Filled 2017-03-24: qty 1

## 2017-03-24 MED ORDER — IPRATROPIUM-ALBUTEROL 0.5-2.5 (3) MG/3ML IN SOLN
RESPIRATORY_TRACT | Status: AC
  Filled 2017-03-24: qty 6

## 2017-03-24 MED ORDER — PEGFILGRASTIM 6 MG/0.6ML ~~LOC~~ PSKT
6.0000 mg | PREFILLED_SYRINGE | Freq: Once | SUBCUTANEOUS | Status: AC
  Administered 2017-03-24: 6 mg via SUBCUTANEOUS
  Filled 2017-03-24: qty 0.6

## 2017-03-24 MED ORDER — HEPARIN SOD (PORK) LOCK FLUSH 100 UNIT/ML IV SOLN
500.0000 [IU] | Freq: Once | INTRAVENOUS | Status: AC | PRN
  Administered 2017-03-24: 500 [IU]
  Filled 2017-03-24: qty 5

## 2017-03-24 MED ORDER — IPRATROPIUM-ALBUTEROL 0.5-2.5 (3) MG/3ML IN SOLN: 3.0000 mL | Freq: Four times a day (QID) | RESPIRATORY_TRACT | Status: DC

## 2017-03-24 MED ORDER — SODIUM CHLORIDE 0.9 % IV SOLN
200.0000 mg | Freq: Once | INTRAVENOUS | Status: AC
  Administered 2017-03-24: 200 mg via INTRAVENOUS
  Filled 2017-03-24: qty 10

## 2017-03-24 MED ORDER — SODIUM CHLORIDE 0.9 % IV SOLN
Freq: Once | INTRAVENOUS | Status: AC
  Administered 2017-03-24: 10:00:00 via INTRAVENOUS
  Filled 2017-03-24: qty 1000

## 2017-03-24 MED ORDER — IPRATROPIUM-ALBUTEROL 0.5-2.5 (3) MG/3ML IN SOLN: 3.0000 mL | RESPIRATORY_TRACT | 1 refills | Status: DC | PRN

## 2017-03-24 NOTE — Progress Notes (Signed)
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  MRI of the brain on February 04, 2017 did not reveal any metastatic disease.   Interval history-Patient was seen on Wednesday for cycle 3-day 1 of carbo etoposide and Tecentriq.  She appeared to be doing pretty well but did admit to exertional shortness of breath starting on Tuesday.  Her Tecentriq was held and she was asked to have a chest x-ray completed.  Chest x-ray ruled out COPD exacerbation and pneumonitis but did show possible progression of lung cancer.  She is scheduled to have a CT scan in approximately 1 week.  Patient did receive carbo and etoposide as scheduled.   Today patient presents for continued exertional shortness of breath.  She states that even the slightest movement such as "wiping herself after using the bathroom" causes her to be short of breath.  She does not have a pulse oximetry at home.  In office oxygen levels remains stable in mid 90s.  Heart rate continues to fluctuate from low 100s to as high as 120.  Patient states this is normal for her.  She is on metoprolol and sometimes forgets to take it.  Patient's daughter has been fighting with insurance company to get an nebulizer machine approved for at home use.  This is recently been approved.   ECOG FS:1 - Symptomatic but completely ambulatory  Review of systems- Review of Systems    Constitutional: Positive for malaise/fatigue. Negative for chills, fever and weight loss.  HENT: Negative.   Eyes: Negative.   Respiratory: Positive for cough, sputum production (yellow) and shortness of breath (exertional ).   Cardiovascular: Negative.  Negative for chest pain and palpitations.  Gastrointestinal: Negative.  Negative for nausea and vomiting.  Musculoskeletal: Negative.   Skin: Negative.   Neurological: Positive for weakness. Negative for dizziness and headaches.  Endo/Heme/Allergies: Negative.   Psychiatric/Behavioral: Negative.      Current treatment- Cycle 3 Carbo/Etopiside last on 03/22/16- Stopped Tecentriq  Allergies  Allergen Reactions  . Iodine Anaphylaxis    IVP dye  . Fentanyl     Overly sedated and groggy, ineffective      Past Medical History:  Diagnosis Date  . Anxiety   . Arthritis   . Asthma    COPD  . Cancer ([place])    Liver  . CAP (community acquired pneumonia) 06/30/2014  . Cardiomyopathy ([place])    nonischemic (EF 35-40%)  . CHF (congestive heart failure) ([place])   . COPD (chronic obstructive pulmonary disease) ([place])   . Depression   . Dyspnea   . GERD (gastroesophageal reflux disease)   . Hyperlipidemia   . Hypertension   . Pneumonia   . PVC's (premature ventricular contractions)    states 10 years ago  . Sepsis ([place]) 06/30/2014  . SOB (shortness of breath) 05/08/2014  . Spinal cord injury at T7-T12 level [facility])    2016     Past Surgical  History:  Procedure Laterality Date  . APPENDECTOMY    . CARDIAC CATHETERIZATION    . CATARACT EXTRACTION     right eye   . CESAREAN SECTION    . COLONOSCOPY    . LAMINOTOMY    . LUMBAR LAMINECTOMY/DECOMPRESSION MICRODISCECTOMY Left 08/01/2016   Procedure: Left Lumbar Four-Five Laminectomy and Foraminotomy;  Surgeon: Earnie Larsson, MD;  Location: [place];  Service: Neurosurgery;  Laterality: Left;  . PORTA CATH INSERTION N/A 02/03/2017   Procedure: PORTA CATH INSERTION;  Surgeon: Katha Cabal,  MD;  Location: [place] [facility];  Service: Cardiovascular;  Laterality: N/A;    Social History   Socioeconomic History  . Marital status: Married    Spouse name: Not on file  . Number of children: Not on file  . Years of education: Not on file  . Highest education level: Not on file  Social Needs  . Financial resource strain: Not on file  . Food insecurity - worry: Not on file  . Food insecurity - inability: Not on file  . Transportation needs - medical: Not on file  . Transportation needs - non-medical: Not on file  Occupational History  . Not on file  Tobacco Use  . Smoking status: Former Smoker    Packs/day: 0.50    Years: 50.00    Pack years: 25.00    Types: Cigarettes    Last attempt to quit: 01/27/2015    Years since quitting: 2.1  . Smokeless tobacco: Never Used  Substance and Sexual Activity  . Alcohol use: No  . Drug use: No  . Sexual activity: Not Currently  Other Topics Concern  . Not on file  Social History Narrative  . Not on file    Family History  Problem Relation Age of Onset  . Heart attack Father 43       MI x 3      Current Outpatient Medications:  .  albuterol (PROAIR HFA) 108 (90 Base) MCG/ACT inhaler, Inhale 2 puffs into the lungs every 6 (six) hours as needed for wheezing or shortness of breath. , Disp: , Rfl:  .  alendronate (FOSAMAX) 70 MG tablet, Take 70 mg by mouth every Thursday. , Disp: , Rfl:  .  ARIPiprazole (ABILIFY) 5 MG tablet, Take 5 mg by mouth daily., Disp: , Rfl:  .  Calcium Citrate-Vitamin D (CALCIUM + D PO), Take 1 tablet by mouth 2 (two) times daily., Disp: , Rfl:  .  DULoxetine (CYMBALTA) 60 MG capsule, Take 60 mg by mouth daily., Disp: , Rfl:  .  ipratropium-albuterol (DUONEB) 0.5-2.5 (3) MG/3ML SOLN, Take 3 mLs by nebulization every 4 (four) hours as needed., Disp: 360 mL, Rfl: 1 .  lidocaine-prilocaine (EMLA) cream, Apply to affected area once, Disp: 30 g, Rfl: 3 .  LORazepam (ATIVAN) 0.5 MG tablet, Take 0.5 mg by  mouth daily as needed for anxiety. , Disp: , Rfl:  .  metoprolol succinate (TOPROL-XL) 100 MG 24 hr tablet, TAKE ONE TABLET BY MOUTH ONCE DAILY, Disp: 30 tablet, Rfl: 0 .  Omega-3 Fatty Acids (FISH OIL) 1000 MG CAPS, Take 1,000 mg by mouth daily., Disp: , Rfl:  .  ondansetron (ZOFRAN) 8 MG tablet, Take 1 tablet (8 mg total) by mouth 2 (two) times daily as needed for refractory nausea / vomiting., Disp: 30 tablet, Rfl: 2 .  oxyCODONE (OXYCONTIN) 20 mg 12 hr tablet, Take 1 tablet (20 mg total) by mouth every 12 (twelve) hours., Disp: 60 tablet, Rfl:  0 .  oxyCODONE-acetaminophen (PERCOCET/ROXICET) 5-325 MG tablet, Take 1-2 tablets by mouth every 6 (six) hours as needed. For pain., Disp: 120 tablet, Rfl: 0 .  potassium chloride SA (K-DUR,KLOR-CON) 20 MEQ tablet, Take 1 tablet (20 mEq total) by mouth 2 (two) times daily., Disp: 60 tablet, Rfl: 1 .  predniSONE (DELTASONE) 20 MG tablet, Take 2 tablets (40 mg total) by mouth daily., Disp: 10 tablet, Rfl: 0 .  prochlorperazine (COMPAZINE) 10 MG tablet, Take 1 tablet (10 mg total) by mouth every 6 (six) hours as needed (Nausea or vomiting)., Disp: 60 tablet, Rfl: 2 .  promethazine (PHENERGAN) 25 MG tablet, Take 1 tablet (25 mg total) by mouth every 6 (six) hours as needed for nausea or vomiting., Disp: 30 tablet, Rfl: 2 .  sacubitril-valsartan (ENTRESTO) 49-51 MG, Take 1 tablet by mouth 2 (two) times daily., Disp: 60 tablet, Rfl: 6  Current Facility-Administered Medications:  .  ipratropium-albuterol (DUONEB) 0.5-2.5 (3) MG/3ML nebulizer solution 3 mL, 3 mL, Nebulization, Q6H, Burns, Jennifer E, NP .  ipratropium-albuterol (DUONEB) 0.5-2.5 (3) MG/3ML nebulizer solution 3 mL, 3 mL, Nebulization, Q6H, Burns, Wandra Feinstein, NP  Facility-Administered Medications Ordered in Other Visits:  .  sodium chloride flush (NS) 0.9 % injection 10 mL, 10 mL, Intravenous, PRN, Lloyd Huger, MD, 10 mL at 03/01/17 0841  Physical exam: There were no vitals filed for this  visit. Physical Exam  Constitutional: She is oriented to person, place, and time and well-developed, well-nourished, and in no distress. Vital signs are normal.  HENT:  Head: Normocephalic and atraumatic.  Eyes: Pupils are equal, round, and reactive to light.  Neck: Normal range of motion.  Cardiovascular: Normal rate, regular rhythm and normal heart sounds.  Pulmonary/Chest: Effort normal. Tachypnea (With exertion) noted. No respiratory distress. She has decreased breath sounds in the right upper field, the right lower field, the left upper field and the left lower field. She has no wheezes. She has no rales. She exhibits no tenderness.  Abdominal: Soft. Normal appearance and bowel sounds are normal.  Musculoskeletal: Normal range of motion.  Neurological: She is alert and oriented to person, place, and time. Gait normal.  Skin: Skin is warm, dry and intact.  Psychiatric: Mood, memory, affect and judgment normal.  Nursing note and vitals reviewed.    CMP Latest Ref Rng & Units 03/22/2017  Glucose 65 - 99 mg/dL 218(H)  BUN 6 - 20 mg/dL 13  Creatinine 0.44 - 1.00 mg/dL 0.85  Sodium 135 - 145 mmol/L 137  Potassium 3.5 - 5.1 mmol/L 3.5  Chloride 101 - 111 mmol/L 107  CO2 22 - 32 mmol/L 19(L)  Calcium 8.9 - 10.3 mg/dL 7.9(L)  Total Protein 6.5 - 8.1 g/dL 7.2  Total Bilirubin 0.3 - 1.2 mg/dL 0.4  Alkaline Phos 38 - 126 U/L 124  AST 15 - 41 U/L 24  ALT 14 - 54 U/L 19   CBC Latest Ref Rng & Units 03/22/2017  WBC 3.6 - 11.0 K/uL 40.2(H)  Hemoglobin 12.0 - 16.0 g/dL 8.3(L)  Hematocrit 35.0 - 47.0 % 25.9(L)  Platelets 150 - 440 K/uL 324    No images are attached to the encounter.  Dg Chest 2 View  Result Date: 03/22/2017 CLINICAL DATA:  Shortness of breath.  Cough. EXAM: CHEST  2 VIEW COMPARISON:  Radiograph of February 20, 2017. PET scan of February 06, 2017. FINDINGS: Stable cardiomediastinal silhouette. Atherosclerosis of thoracic aorta is noted. No pneumothorax or pleural effusion is  noted. Right internal jugular  Port-A-Cath is unchanged in position. Interval development of bilateral pulmonary nodules are noted concerning for metastatic disease. Bony thorax is unremarkable. IMPRESSION: Aortic atherosclerosis. Interval development of bilateral pulmonary nodules is noted concerning for metastatic disease. CT scan of the chest is recommended for further evaluation. Electronically Signed   By: Marijo Conception, M.D.   On: 03/22/2017 13:59     Assessment and plan- Patient is a 67 y.o. female who presents for continued exertional shortness of breath.  Oxygen saturations during office visit were 95-96% without oxygen.  When talking or moving about she is visibly short of breath. Increased respirations as high as 22-26/min. Heart rate remains stable. She has taken her Metoprolol today.  Lungs are clear.  Patient does cough up yellow sputum at times.  Her voice is hoarse.  She does admit to feeling on her has been often but this is new for her.  1.  CT scan is scheduled for approximately 1 week out. 2.  Nebulizer machine: Sent in prescription for DuoNeb for her to use as needed.  After breathing treatments patient felt much better.  Education provided about duo nebs and side effects of medications.  Patient is a retired Therapist, sports and is aware to not take albuterol with DuoNeb.  3.  Gave 2 DuoNeb's in office with significant improvement of breathing per patient.  O2 sats 100%.  4.  Tachycardia: Better today. She took her metoprolol. After both breath tx patient's heart rate was 67.  5. RTC as scheduled to see Dr. Grayland Ormond.  Patient encouraged to call after hours line this weekend if she develops any more unrelieved shortness of breath, tachycardia or fever that is not controlled with medications prescribed.    Visit Diagnosis 1. Small cell lung cancer ([place])   2. Tachycardia   3. Shortness of breath     Patient expressed understanding and was in agreement with this plan. She also understands that  She can call clinic at any time with any questions, concerns, or complaints.   Greater than 50% was spent in counseling and coordination of care with this patient including but not limited to discussion of the relevant topics above (See A&P) including, but not limited to diagnosis and management of acute and chronic medical conditions.    Faythe Casa, AGNP-C [facility], [place] at [place]- [phone] Pager- [phone] 03/24/2017 1:11 PM

## 2017-03-27 ENCOUNTER — Other Ambulatory Visit: Payer: Self-pay | Admitting: *Deleted

## 2017-03-27 MED ORDER — IPRATROPIUM-ALBUTEROL 0.5-2.5 (3) MG/3ML IN SOLN: 3.0000 mL | RESPIRATORY_TRACT | 1 refills | Status: DC | PRN

## 2017-03-28 ENCOUNTER — Other Ambulatory Visit: Payer: Self-pay | Admitting: *Deleted

## 2017-03-28 MED ORDER — IPRATROPIUM-ALBUTEROL 0.5-2.5 (3) MG/3ML IN SOLN: 3.0000 mL | RESPIRATORY_TRACT | 1 refills | Status: DC | PRN

## 2017-04-03 ENCOUNTER — Other Ambulatory Visit: Payer: Self-pay | Admitting: *Deleted

## 2017-04-03 ENCOUNTER — Ambulatory Visit: Admission: RE | Admit: 2017-04-03 | Payer: Medicare Other | Source: Ambulatory Visit

## 2017-04-03 ENCOUNTER — Ambulatory Visit
Admission: RE | Admit: 2017-04-03 | Discharge: 2017-04-03 | Disposition: A | Discharge status: Home / Self Care | Payer: Medicare Other | Source: Ambulatory Visit | Attending: Oncology | Admitting: Oncology

## 2017-04-03 ENCOUNTER — Telehealth: Payer: Self-pay | Admitting: *Deleted

## 2017-04-03 DIAGNOSIS — C3490 Malignant neoplasm of unspecified part of unspecified bronchus or lung: Secondary | ICD-10-CM

## 2017-04-03 MED ORDER — IPRATROPIUM-ALBUTEROL 0.5-2.5 (3) MG/3ML IN SOLN: 3.0000 mL | RESPIRATORY_TRACT | 1 refills | Status: AC | PRN

## 2017-04-03 NOTE — Telephone Encounter (Signed)
Patient daughter informed of prescription sent in

## 2017-04-03 NOTE — Telephone Encounter (Signed)
RX faxed

## 2017-04-03 NOTE — Telephone Encounter (Signed)
Patient scheduled for CT tomorrow and she is allergic to contrast. Please send allergy prep to [place]

## 2017-04-04 ENCOUNTER — Inpatient Hospital Stay: Payer: Medicare Other

## 2017-04-04 ENCOUNTER — Encounter: Payer: Self-pay | Admitting: Nurse Practitioner

## 2017-04-04 ENCOUNTER — Inpatient Hospital Stay (HOSPITAL_BASED_OUTPATIENT_CLINIC_OR_DEPARTMENT_OTHER): Payer: Medicare Other | Admitting: Nurse Practitioner

## 2017-04-04 ENCOUNTER — Other Ambulatory Visit: Payer: Self-pay | Admitting: Nurse Practitioner

## 2017-04-04 ENCOUNTER — Ambulatory Visit
Admission: RE | Admit: 2017-04-04 | Discharge: 2017-04-04 | Disposition: A | Discharge status: Home / Self Care | Payer: Medicare Other | Source: Ambulatory Visit | Attending: Oncology | Admitting: Oncology

## 2017-04-04 ENCOUNTER — Inpatient Hospital Stay
Admission: AD | Admit: 2017-04-04 | Discharge: 2017-04-09 | DRG: 640 | Disposition: A | Discharge status: Home Health | Payer: Medicare Other | Source: Ambulatory Visit | Attending: Internal Medicine | Admitting: Internal Medicine

## 2017-04-04 ENCOUNTER — Other Ambulatory Visit: Payer: Self-pay

## 2017-04-04 ENCOUNTER — Telehealth: Payer: Self-pay | Admitting: *Deleted

## 2017-04-04 ENCOUNTER — Other Ambulatory Visit (HOSPITAL_COMMUNITY): Payer: Self-pay | Admitting: Oncology

## 2017-04-04 VITALS — BP 92/58 | HR 98 | Temp 96.8°F | Resp 20

## 2017-04-04 DIAGNOSIS — C787 Secondary malignant neoplasm of liver and intrahepatic bile duct: Secondary | ICD-10-CM | POA: Diagnosis not present

## 2017-04-04 DIAGNOSIS — D709 Neutropenia, unspecified: Secondary | ICD-10-CM | POA: Diagnosis not present

## 2017-04-04 DIAGNOSIS — N170 Acute kidney failure with tubular necrosis: Secondary | ICD-10-CM | POA: Diagnosis present

## 2017-04-04 DIAGNOSIS — T451X5A Adverse effect of antineoplastic and immunosuppressive drugs, initial encounter: Secondary | ICD-10-CM | POA: Diagnosis present

## 2017-04-04 DIAGNOSIS — J441 Chronic obstructive pulmonary disease with (acute) exacerbation: Secondary | ICD-10-CM | POA: Diagnosis present

## 2017-04-04 DIAGNOSIS — I509 Heart failure, unspecified: Secondary | ICD-10-CM

## 2017-04-04 DIAGNOSIS — Z91041 Radiographic dye allergy status: Secondary | ICD-10-CM

## 2017-04-04 DIAGNOSIS — E871 Hypo-osmolality and hyponatremia: Principal | ICD-10-CM | POA: Diagnosis present

## 2017-04-04 DIAGNOSIS — Z5111 Encounter for antineoplastic chemotherapy: Secondary | ICD-10-CM | POA: Diagnosis not present

## 2017-04-04 DIAGNOSIS — R4182 Altered mental status, unspecified: Secondary | ICD-10-CM | POA: Diagnosis not present

## 2017-04-04 DIAGNOSIS — R4689 Other symptoms and signs involving appearance and behavior: Secondary | ICD-10-CM

## 2017-04-04 DIAGNOSIS — E785 Hyperlipidemia, unspecified: Secondary | ICD-10-CM | POA: Diagnosis present

## 2017-04-04 DIAGNOSIS — D72829 Elevated white blood cell count, unspecified: Secondary | ICD-10-CM

## 2017-04-04 DIAGNOSIS — R413 Other amnesia: Secondary | ICD-10-CM | POA: Diagnosis not present

## 2017-04-04 DIAGNOSIS — I428 Other cardiomyopathies: Secondary | ICD-10-CM | POA: Diagnosis present

## 2017-04-04 DIAGNOSIS — D6959 Other secondary thrombocytopenia: Secondary | ICD-10-CM

## 2017-04-04 DIAGNOSIS — C3490 Malignant neoplasm of unspecified part of unspecified bronchus or lung: Secondary | ICD-10-CM

## 2017-04-04 DIAGNOSIS — J449 Chronic obstructive pulmonary disease, unspecified: Secondary | ICD-10-CM | POA: Diagnosis not present

## 2017-04-04 DIAGNOSIS — I700 Atherosclerosis of aorta: Secondary | ICD-10-CM

## 2017-04-04 DIAGNOSIS — D649 Anemia, unspecified: Secondary | ICD-10-CM | POA: Diagnosis not present

## 2017-04-04 DIAGNOSIS — Z79899 Other long term (current) drug therapy: Secondary | ICD-10-CM | POA: Diagnosis not present

## 2017-04-04 DIAGNOSIS — C771 Secondary and unspecified malignant neoplasm of intrathoracic lymph nodes: Secondary | ICD-10-CM

## 2017-04-04 DIAGNOSIS — D6481 Anemia due to antineoplastic chemotherapy: Secondary | ICD-10-CM | POA: Diagnosis present

## 2017-04-04 DIAGNOSIS — I255 Ischemic cardiomyopathy: Secondary | ICD-10-CM | POA: Diagnosis not present

## 2017-04-04 DIAGNOSIS — M129 Arthropathy, unspecified: Secondary | ICD-10-CM | POA: Diagnosis not present

## 2017-04-04 DIAGNOSIS — I110 Hypertensive heart disease with heart failure: Secondary | ICD-10-CM | POA: Diagnosis present

## 2017-04-04 DIAGNOSIS — E860 Dehydration: Secondary | ICD-10-CM | POA: Diagnosis present

## 2017-04-04 DIAGNOSIS — K121 Other forms of stomatitis: Secondary | ICD-10-CM | POA: Diagnosis not present

## 2017-04-04 DIAGNOSIS — G9341 Metabolic encephalopathy: Secondary | ICD-10-CM | POA: Diagnosis not present

## 2017-04-04 DIAGNOSIS — J440 Chronic obstructive pulmonary disease with acute lower respiratory infection: Secondary | ICD-10-CM | POA: Diagnosis present

## 2017-04-04 DIAGNOSIS — F329 Major depressive disorder, single episode, unspecified: Secondary | ICD-10-CM | POA: Diagnosis present

## 2017-04-04 DIAGNOSIS — C7951 Secondary malignant neoplasm of bone: Secondary | ICD-10-CM | POA: Diagnosis not present

## 2017-04-04 DIAGNOSIS — N179 Acute kidney failure, unspecified: Secondary | ICD-10-CM | POA: Diagnosis not present

## 2017-04-04 DIAGNOSIS — T380X5A Adverse effect of glucocorticoids and synthetic analogues, initial encounter: Secondary | ICD-10-CM | POA: Diagnosis present

## 2017-04-04 DIAGNOSIS — Z87891 Personal history of nicotine dependence: Secondary | ICD-10-CM

## 2017-04-04 DIAGNOSIS — Z7952 Long term (current) use of systemic steroids: Secondary | ICD-10-CM

## 2017-04-04 DIAGNOSIS — C3412 Malignant neoplasm of upper lobe, left bronchus or lung: Secondary | ICD-10-CM

## 2017-04-04 DIAGNOSIS — R410 Disorientation, unspecified: Secondary | ICD-10-CM | POA: Insufficient documentation

## 2017-04-04 DIAGNOSIS — Z5189 Encounter for other specified aftercare: Secondary | ICD-10-CM

## 2017-04-04 DIAGNOSIS — R4020 Unspecified coma: Secondary | ICD-10-CM | POA: Diagnosis not present

## 2017-04-04 DIAGNOSIS — R739 Hyperglycemia, unspecified: Secondary | ICD-10-CM | POA: Diagnosis present

## 2017-04-04 DIAGNOSIS — K219 Gastro-esophageal reflux disease without esophagitis: Secondary | ICD-10-CM

## 2017-04-04 DIAGNOSIS — Z7983 Long term (current) use of bisphosphonates: Secondary | ICD-10-CM

## 2017-04-04 DIAGNOSIS — Z66 Do not resuscitate: Secondary | ICD-10-CM | POA: Diagnosis not present

## 2017-04-04 DIAGNOSIS — M549 Dorsalgia, unspecified: Secondary | ICD-10-CM | POA: Diagnosis not present

## 2017-04-04 DIAGNOSIS — Z885 Allergy status to narcotic agent status: Secondary | ICD-10-CM

## 2017-04-04 DIAGNOSIS — I10 Essential (primary) hypertension: Secondary | ICD-10-CM

## 2017-04-04 DIAGNOSIS — I429 Cardiomyopathy, unspecified: Secondary | ICD-10-CM

## 2017-04-04 DIAGNOSIS — R4189 Other symptoms and signs involving cognitive functions and awareness: Secondary | ICD-10-CM

## 2017-04-04 DIAGNOSIS — D696 Thrombocytopenia, unspecified: Secondary | ICD-10-CM

## 2017-04-04 DIAGNOSIS — R918 Other nonspecific abnormal finding of lung field: Secondary | ICD-10-CM | POA: Diagnosis not present

## 2017-04-04 DIAGNOSIS — J209 Acute bronchitis, unspecified: Secondary | ICD-10-CM | POA: Diagnosis present

## 2017-04-04 DIAGNOSIS — G8929 Other chronic pain: Secondary | ICD-10-CM

## 2017-04-04 DIAGNOSIS — Z7189 Other specified counseling: Secondary | ICD-10-CM

## 2017-04-04 DIAGNOSIS — Z7401 Bed confinement status: Secondary | ICD-10-CM | POA: Diagnosis not present

## 2017-04-04 DIAGNOSIS — R419 Unspecified symptoms and signs involving cognitive functions and awareness: Secondary | ICD-10-CM | POA: Diagnosis not present

## 2017-04-04 DIAGNOSIS — F419 Anxiety disorder, unspecified: Secondary | ICD-10-CM

## 2017-04-04 LAB — CBC WITH DIFFERENTIAL/PLATELET
BASOS PCT: 0 %
Basophils Absolute: 0.1 10*3/uL (ref 0–0.1)
EOS ABS: 0 10*3/uL (ref 0–0.7)
Eosinophils Relative: 0 %
HEMATOCRIT: 19.7 % — AB (ref 35.0–47.0)
HEMOGLOBIN: 6.6 g/dL — AB (ref 12.0–16.0)
LYMPHS ABS: 1.4 10*3/uL (ref 1.0–3.6)
Lymphocytes Relative: 6 %
MCH: 31.2 pg (ref 26.0–34.0)
MCHC: 33.5 g/dL (ref 32.0–36.0)
MCV: 93.1 fL (ref 80.0–100.0)
Monocytes Absolute: 0.1 10*3/uL — ABNORMAL LOW (ref 0.2–0.9)
Monocytes Relative: 0 %
NEUTROS ABS: 22.1 10*3/uL — AB (ref 1.4–6.5)
NEUTROS PCT: 94 %
Platelets: 65 10*3/uL — ABNORMAL LOW (ref 150–440)
RBC: 2.12 MIL/uL — AB (ref 3.80–5.20)
RDW: 16 % — ABNORMAL HIGH (ref 11.5–14.5)
WBC: 23.7 10*3/uL — AB (ref 3.6–11.0)

## 2017-04-04 LAB — COMPREHENSIVE METABOLIC PANEL
ALBUMIN: 3 g/dL — AB (ref 3.5–5.0)
ALT: 29 U/L (ref 14–54)
AST: 20 U/L (ref 15–41)
Alkaline Phosphatase: 121 U/L (ref 38–126)
Anion gap: 10 (ref 5–15)
BILIRUBIN TOTAL: 0.4 mg/dL (ref 0.3–1.2)
BUN: 46 mg/dL — AB (ref 6–20)
CO2: 21 mmol/L — AB (ref 22–32)
CREATININE: 2.53 mg/dL — AB (ref 0.44–1.00)
Calcium: 7.1 mg/dL — ABNORMAL LOW (ref 8.9–10.3)
Chloride: 95 mmol/L — ABNORMAL LOW (ref 101–111)
GFR calc Af Amer: 22 mL/min — ABNORMAL LOW (ref >60)
GFR calc non Af Amer: 19 mL/min — ABNORMAL LOW (ref >60)
Glucose, Bld: 176 mg/dL — ABNORMAL HIGH (ref 65–99)
Potassium: 4.8 mmol/L (ref 3.5–5.1)
SODIUM: 126 mmol/L — AB (ref 135–145)
TOTAL PROTEIN: 7.1 g/dL (ref 6.5–8.1)

## 2017-04-04 LAB — BASIC METABOLIC PANEL
Anion gap: 11 (ref 5–15)
BUN: 42 mg/dL — AB (ref 6–20)
CHLORIDE: 99 mmol/L — AB (ref 101–111)
CO2: 20 mmol/L — AB (ref 22–32)
CREATININE: 2.05 mg/dL — AB (ref 0.44–1.00)
Calcium: 7 mg/dL — ABNORMAL LOW (ref 8.9–10.3)
GFR calc Af Amer: 28 mL/min — ABNORMAL LOW (ref >60)
GFR calc non Af Amer: 24 mL/min — ABNORMAL LOW (ref >60)
GLUCOSE: 195 mg/dL — AB (ref 65–99)
Potassium: 4.6 mmol/L (ref 3.5–5.1)
Sodium: 130 mmol/L — ABNORMAL LOW (ref 135–145)

## 2017-04-04 MED ORDER — OXYCODONE-ACETAMINOPHEN 5-325 MG PO TABS
2.0000 | ORAL_TABLET | Freq: Four times a day (QID) | ORAL | Status: DC | PRN
  Administered 2017-04-04 – 2017-04-09 (×14): 2 via ORAL
  Filled 2017-04-04 (×14): qty 2

## 2017-04-04 MED ORDER — METHYLPREDNISOLONE SODIUM SUCC 125 MG IJ SOLR
60.0000 mg | INTRAMUSCULAR | Status: DC
  Administered 2017-04-04 – 2017-04-05 (×2): 60 mg via INTRAVENOUS
  Filled 2017-04-04 (×2): qty 2

## 2017-04-04 MED ORDER — SODIUM CHLORIDE 0.9 % IV SOLN
INTRAVENOUS | Status: DC
  Administered 2017-04-04: 19:00:00 via INTRAVENOUS

## 2017-04-04 MED ORDER — ONDANSETRON HCL 4 MG PO TABS: 4.0000 mg | ORAL_TABLET | Freq: Four times a day (QID) | ORAL | Status: DC | PRN

## 2017-04-04 MED ORDER — SODIUM CHLORIDE 0.9 % IV SOLN
1.0000 g | INTRAVENOUS | Status: DC
  Administered 2017-04-04 – 2017-04-08 (×5): 1 g via INTRAVENOUS
  Filled 2017-04-04 (×7): qty 10

## 2017-04-04 MED ORDER — ACETAMINOPHEN 325 MG PO TABS: 650.0000 mg | ORAL_TABLET | Freq: Four times a day (QID) | ORAL | Status: DC | PRN

## 2017-04-04 MED ORDER — ONDANSETRON HCL 4 MG/2ML IJ SOLN
4.0000 mg | Freq: Four times a day (QID) | INTRAMUSCULAR | Status: DC | PRN
  Administered 2017-04-05: 18:00:00 4 mg via INTRAVENOUS
  Filled 2017-04-04: qty 2

## 2017-04-04 MED ORDER — HEPARIN SODIUM (PORCINE) 5000 UNIT/ML IJ SOLN
5000.0000 [IU] | Freq: Three times a day (TID) | INTRAMUSCULAR | Status: DC
  Administered 2017-04-04 – 2017-04-09 (×14): 5000 [IU] via SUBCUTANEOUS
  Filled 2017-04-04 (×13): qty 1

## 2017-04-04 MED ORDER — TRAZODONE HCL 50 MG PO TABS
25.0000 mg | ORAL_TABLET | Freq: Every evening | ORAL | Status: DC | PRN
  Administered 2017-04-05 – 2017-04-09 (×2): 25 mg via ORAL
  Filled 2017-04-04 (×2): qty 1

## 2017-04-04 MED ORDER — DOCUSATE SODIUM 100 MG PO CAPS
100.0000 mg | ORAL_CAPSULE | Freq: Two times a day (BID) | ORAL | Status: DC
  Administered 2017-04-04 – 2017-04-08 (×6): 100 mg via ORAL
  Filled 2017-04-04 (×9): qty 1

## 2017-04-04 MED ORDER — BISACODYL 5 MG PO TBEC: 5.0000 mg | DELAYED_RELEASE_TABLET | Freq: Every day | ORAL | Status: DC | PRN

## 2017-04-04 MED ORDER — IPRATROPIUM-ALBUTEROL 0.5-2.5 (3) MG/3ML IN SOLN
3.0000 mL | RESPIRATORY_TRACT | Status: DC
  Administered 2017-04-04 – 2017-04-05 (×5): 3 mL via RESPIRATORY_TRACT
  Filled 2017-04-04 (×5): qty 3

## 2017-04-04 MED ORDER — MORPHINE SULFATE (PF) 4 MG/ML IV SOLN: 4.0000 mg | Freq: Four times a day (QID) | INTRAVENOUS | Status: DC | PRN

## 2017-04-04 MED ORDER — SODIUM CHLORIDE 0.9 % IV SOLN
Freq: Once | INTRAVENOUS | Status: AC
  Administered 2017-04-04: 15:00:00 via INTRAVENOUS
  Filled 2017-04-04: qty 1000

## 2017-04-04 MED ORDER — ACETAMINOPHEN 650 MG RE SUPP: 650.0000 mg | Freq: Four times a day (QID) | RECTAL | Status: DC | PRN

## 2017-04-04 MED ORDER — SODIUM CHLORIDE 0.9% FLUSH
10.0000 mL | INTRAVENOUS | Status: DC | PRN
  Administered 2017-04-04: 10 mL via INTRAVENOUS
  Filled 2017-04-04: qty 10

## 2017-04-04 MED ORDER — IOPAMIDOL (ISOVUE-300) INJECTION 61%
100.0000 mL | Freq: Once | INTRAVENOUS | Status: AC | PRN
  Administered 2017-04-04: 100 mL via INTRAVENOUS

## 2017-04-04 MED ORDER — ALBUTEROL SULFATE (2.5 MG/3ML) 0.083% IN NEBU
3.0000 mL | INHALATION_SOLUTION | Freq: Four times a day (QID) | RESPIRATORY_TRACT | Status: DC | PRN
  Administered 2017-04-04: 17:00:00 3 mL via RESPIRATORY_TRACT
  Filled 2017-04-04: qty 3

## 2017-04-04 MED ORDER — SODIUM CHLORIDE 0.9 % IV SOLN
500.0000 mg | INTRAVENOUS | Status: DC
  Administered 2017-04-04 – 2017-04-05 (×2): 500 mg via INTRAVENOUS
  Filled 2017-04-04 (×3): qty 500

## 2017-04-04 NOTE — H&P (Signed)
[facility] NAME: Grace [name]    MR#:  [id number]  DATE OF BIRTH:  [birth year]  DATE OF ADMISSION:  04/04/2017  PRIMARY CARE PHYSICIAN: [name], MD   REQUESTING/REFERRING PHYSICIAN: Dr. Grayland Ormond  CHIEF COMPLAINT: Altered mental status  No chief complaint on file.   HISTORY OF PRESENT ILLNESS:  Grace [name]  is a 67 y.o. female with a known history of lung cancer went to Dr. Gary Fleet office and found to have severe hyponatremia, acute kidney injury.  Brought by family because of worsening mental status.  According to family symptoms started a week ago and gradually worsened.  Patient is weak, confused, repetitive speech, poor p.o. intake.  Patient received some IV fluids in the cancer center and patient daughter told me that her mental status is much better than before.  Patient having cough, thick yellow sputum and she says that she is having COPD exacerbation and possible bronchitis.  CT chest done at cancer center but results are pending.  CT head did not show any metastasis.    PAST MEDICAL HISTORY:   Past Medical History:  Diagnosis Date  . Anxiety   . Arthritis   . Asthma    COPD  . Cancer ([place])    Liver  . CAP (community acquired pneumonia) 06/30/2014  . Cardiomyopathy ([place])    nonischemic (EF 35-40%)  . CHF (congestive heart failure) ([place])   . COPD (chronic obstructive pulmonary disease) ([place])   . Depression   . Dyspnea   . GERD (gastroesophageal reflux disease)   . Hyperlipidemia   . Hypertension   . Pneumonia   . PVC's (premature ventricular contractions)    states 10 years ago  . Sepsis ([place]) 06/30/2014  . SOB (shortness of breath) 05/08/2014  . Spinal cord injury at T7-T12 level [facility])    2016    PAST SURGICAL HISTOIRY:   Past Surgical History:  Procedure Laterality Date  . APPENDECTOMY    . CARDIAC CATHETERIZATION    . CATARACT EXTRACTION     right eye   . CESAREAN SECTION    .  COLONOSCOPY    . LAMINOTOMY    . LUMBAR LAMINECTOMY/DECOMPRESSION MICRODISCECTOMY Left 08/01/2016   Procedure: Left Lumbar Four-Five Laminectomy and Foraminotomy;  Surgeon: Earnie Larsson, MD;  Location: [facility];  Service: Neurosurgery;  Laterality: Left;  . PORTA CATH INSERTION N/A 02/03/2017   Procedure: PORTA CATH INSERTION;  Surgeon: Katha Cabal, MD;  Location: [facility];  Service: Cardiovascular;  Laterality: N/A;    SOCIAL HISTORY:   Social History   Tobacco Use  . Smoking status: Former Smoker    Packs/day: 0.50    Years: 50.00    Pack years: 25.00    Types: Cigarettes    Last attempt to quit: 01/27/2015    Years since quitting: 2.1  . Smokeless tobacco: Never Used  Substance Use Topics  . Alcohol use: No    FAMILY HISTORY:   Family History  Problem Relation Age of Onset  . Heart attack Father 20       MI x 3     DRUG ALLERGIES:   Allergies  Allergen Reactions  . Iodine Anaphylaxis    IVP dye - has been premedicated previously without incidence. See 01/29/17 CT notes.  . Fentanyl     Overly sedated and groggy, ineffective     REVIEW OF SYSTEMS:  CONSTITUTIONAL: No fever, fatigue, poor p.o. intake  eYES:  No blurred or double vision.  EARS, NOSE, AND THROAT: No tinnitus or ear pain.  RESPIRATORY: Cough, mild shortness of breath, thick yellow sputum cARDIOVASCULAR: No chest pain, orthopnea, edema.  GASTROINTESTINAL: No nausea, vomiting, diarrhea or abdominal pain.  GENITOURINARY: No dysuria, hematuria.  ENDOCRINE: No polyuria, nocturia,  HEMATOLOGY: No anemia, easy bruising or bleeding SKIN: No rash or lesion. MUSCULOSKELETAL: No joint pain or arthritis.   NEUROLOGIC: No tingling, numbness, weakness.  PSYCHIATRY: No anxiety or depression.   MEDICATIONS AT HOME:   Prior to Admission medications   Medication Sig Start Date End Date Taking? Authorizing Provider  albuterol (PROAIR HFA) 108 (90 Base) MCG/ACT inhaler Inhale 2 puffs into the lungs  every 6 (six) hours as needed for wheezing or shortness of breath.  08/09/16   [provider]  alendronate (FOSAMAX) 70 MG tablet Take 70 mg by mouth every Thursday.  06/23/16   [provider]  ARIPiprazole (ABILIFY) 5 MG tablet Take 5 mg by mouth daily.    [provider]  Calcium Citrate-Vitamin D (CALCIUM + D PO) Take 1 tablet by mouth 2 (two) times daily.    [provider]  DULoxetine (CYMBALTA) 60 MG capsule Take 60 mg by mouth daily.    [provider]  ipratropium-albuterol (DUONEB) 0.5-2.5 (3) MG/3ML SOLN Take 3 mLs by nebulization every 4 (four) hours as needed. 04/03/17   Jacquelin Hawking, NP  lidocaine-prilocaine (EMLA) cream Apply to affected area once 01/31/17   Lloyd Huger, MD  LORazepam (ATIVAN) 0.5 MG tablet Take 0.5 mg by mouth daily as needed for anxiety.     [provider]  metoprolol succinate (TOPROL-XL) 100 MG 24 hr tablet TAKE ONE TABLET BY MOUTH ONCE DAILY 08/28/15   Minna Merritts, MD  Omega-3 Fatty Acids (FISH OIL) 1000 MG CAPS Take 1,000 mg by mouth daily.    [provider]  ondansetron (ZOFRAN) 8 MG tablet Take 1 tablet (8 mg total) by mouth 2 (two) times daily as needed for refractory nausea / vomiting. 01/31/17   Lloyd Huger, MD  oxyCODONE (OXYCONTIN) 20 mg 12 hr tablet Take 1 tablet (20 mg total) by mouth every 12 (twelve) hours. 01/30/17   Lloyd Huger, MD  oxyCODONE-acetaminophen (PERCOCET/ROXICET) 5-325 MG tablet Take 1-2 tablets by mouth every 6 (six) hours as needed. For pain. 03/22/17   Jacquelin Hawking, NP  potassium chloride SA (K-DUR,KLOR-CON) 20 MEQ tablet Take 1 tablet (20 mEq total) by mouth 2 (two) times daily. 03/01/17   Lloyd Huger, MD  predniSONE (DELTASONE) 20 MG tablet Take 2 tablets (40 mg total) by mouth daily. 02/20/17   Harvest Dark, MD  prochlorperazine (COMPAZINE) 10 MG tablet Take 1 tablet (10 mg total) by mouth every 6 (six) hours as needed  (Nausea or vomiting). 01/31/17   Lloyd Huger, MD  promethazine (PHENERGAN) 25 MG tablet Take 1 tablet (25 mg total) by mouth every 6 (six) hours as needed for nausea or vomiting. 01/20/17   Lloyd Huger, MD  sacubitril-valsartan (ENTRESTO) 49-51 MG Take 1 tablet by mouth 2 (two) times daily. 07/25/16   Minna Merritts, MD      VITAL SIGNS:  There were no vitals taken for this visit.  PHYSICAL EXAMINATION:  GENERAL:  67 y.o.-year-old patient lying in the bed with no acute distress.  Having constant cough, phlegm. EYES: Pupils equal, round, reactive to light . No scleral icterus. Extraocular muscles intact.  HEENT: Head atraumatic, normocephalic. Oropharynx  and nasopharynx clear.  NECK:  Supple, no jugular venous distention. No thyroid enlargement, no tenderness.  LUNGS: Normal breath sounds bilaterally, no wheezing, rales,rhonchi or crepitation. No use of accessory muscles of respiration.  CARDIOVASCULAR: S1, S2 normal. No murmurs, rubs, or gallops.  ABDOMEN: Soft, nontender, nondistended. Bowel sounds present. No organomegaly or mass.  EXTREMITIES: No pedal edema, cyanosis, or clubbing.  NEUROLOGIC: Cranial nerves II through XII are intact. Muscle strength 5/5 in all extremities. Sensation intact. Gait not checked.  PSYCHIATRIC: The patient is alert and oriented x 3.  SKIN: No obvious rash, lesion, or ulcer.   LABORATORY PANEL:   CBC Recent Labs  Lab 04/04/17 1359  WBC 23.7*  HGB 6.6*  HCT 19.7*  PLT 65*   ------------------------------------------------------------------------------------------------------------------  Chemistries  Recent Labs  Lab 04/04/17 1359  NA 126*  K 4.8  CL 95*  CO2 21*  GLUCOSE 176*  BUN 46*  CREATININE 2.53*  CALCIUM 7.1*  AST 20  ALT 29  ALKPHOS 121  BILITOT 0.4   ------------------------------------------------------------------------------------------------------------------  Cardiac Enzymes No results for input(s):  TROPONINI in the last 168 hours. ------------------------------------------------------------------------------------------------------------------  RADIOLOGY:  Ct Head Wo Contrast  Result Date: 04/04/2017 CLINICAL DATA:  67 year old female with altered level of consciousness. Small cell lung cancer. Last chemotherapy 1 week ago. Initial encounter. EXAM: CT HEAD WITHOUT CONTRAST TECHNIQUE: Contiguous axial images were obtained from the base of the skull through the vertex without intravenous contrast. COMPARISON:  02/04/2017 brain MR. FINDINGS: Brain: No intracranial hemorrhage or CT evidence of large acute infarct. Moderate chronic microvascular changes. Atrophy.  No intracranial mass lesion noted on this unenhanced exam. Vascular: Vascular calcifications Skull: No worrisome destructive lesion. Sinuses/Orbits: No acute orbital abnormality. Visualized paranasal sinuses clear. Other: Mastoid air cells and middle ear cavities are clear. IMPRESSION: No acute intracranial abnormality noted. Moderate chronic microvascular changes. Atrophy. No intracranial mass lesion noted on this unenhanced exam. No intracranial enhancing lesion noted on 02/04/2017 MR. If intracranial metastatic disease were of high clinical concern, follow-up MR may then be considered. Electronically Signed   By: Genia Del M.D.   On: 04/04/2017 12:25    EKG:   Orders placed or performed in visit on 10/24/16  . EKG 12-Lead    IMPRESSION AND PLAN:   67 year old female patient with stage IV small cell lung cancer followed by Dr. Grayland Ormond currently on chemotherapy sent in from cancer center for altered mental status with weakness, poor p.o. intake.  Found to have hyponatremia. #1 acute hyponatremia causing altered mental status, generalized weakness: Admit to oncology unit, start gentle hydration, check frequent Chem-7's.  to avoid fast correction of hyponatremia  #2 acute renal failure with ATN secondary to p.o. intake and  dehydration: Continue gentle hydration, avoid nephrotoxic agents, recheck Chem-7 tomorrow. 3.  Acute bronchitis, history of COPD: Added IV steroids as per patient request, continue bronchodilators, empiric antibiotics.  Patient is a retired [employer] scientist.  CT chest results are pending.  Check sputum cultures. 4.  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  Patient has thrombocytopenia with chemotherapy, platelets at 65 today.   #5 anemia secondary to chemotherapy.  Will discuss with oncology to see if she can get a transfusion. Discussed with patient's daughter.  All the records are reviewed and case discussed with ED provider. Management plans discussed with the patient, family and they are in agreement.  CODE STATUS:  full code  TOTAL TIME TAKING CARE OF THIS PATIENT: 55 minutes.  Epifanio Lesches M.D on 04/04/2017 at 5:04 PM  Between 7am to 6pm - Pager - [phone]  After 6pm go to [email or web address] - password [email or web address] [facility]  Office  [phone]  CC: Primary care physician; [name], MD  Note: This dictation was prepared with Dragon dictation along with smaller phrase technology. Any transcriptional errors that result from this process are unintentional.

## 2017-04-04 NOTE — Progress Notes (Signed)
Pt. Ordered 2 antibiotics. Incompatible. Consulted pharmacy. Rocephin hung first per pharmacy instructions.

## 2017-04-04 NOTE — Addendum Note (Signed)
Addended by: Betti Cruz on: 04/04/2017 01:33 PM   Modules accepted: Orders

## 2017-04-04 NOTE — Progress Notes (Signed)
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 Grayland Ormond, on 03/01/17. Patient initially presented to clinic with after abnormal abdominal ultrasound for mildly elevated liver enzymes. Imaging revealed multiple lesions concerning for metastatic disease. CT 01/19/17 revealed bulky left suprahilar mass extending into left hilum and AP window most consistent with bronchogenic carcinoma favoring small cell origin. Contralateral nodal mets on mediastinum. Left suprahilar mass constricting LUL bronchus as well as left main pulmonary artery. Large bilobar hepatic mets. Enlarged periportal metastatic LNs. High concern for skeletal mets in thoracic spine and pelvis.  02/04/17- MRI brain negative for metastatic disease 02/06/17- PET - hypermetabolic left hilar mass extending into AP window consistent with small cell lung cancer. Hypermetabolic contralateral mediastinal adenopathy. Large hypermetabolic masses within the left hepatic lobe. Nodal metastasis in upper abdomen at periportal nodal station and several hyper skeletal mets.  02/08/17- Initiated Cycle 1 Carbo+Etopo+Tecentriq with Neulasta Support and Zometa for bone mets.   Interval history- Patient [facility] for complaints of altered mental status. Symptoms began approximately 1 week ago and have gradually worsened. Associated symptoms: weakness, confusion, repetitive speech, altered word finding. Her family felt symptoms were related to 'chemo brain',  encouraged fluids and intake but symptoms did not improve. She has had poor oral intake and fatigue since chemotherapy.   ECOG FS:3 - Symptomatic, >50% confined to bed  Review of systems- Review of Systems  Constitutional: Positive for malaise/fatigue and weight loss. Negative for chills, diaphoresis and fever.  HENT: Negative for congestion, ear pain, hearing loss, nosebleeds, sinus pain and sore throat.   Eyes: Negative for discharge and redness.  Respiratory: Negative for cough, hemoptysis, sputum production, shortness of breath and wheezing.   Cardiovascular: Negative for chest pain, palpitations, orthopnea, claudication, leg swelling and PND.  Gastrointestinal: Negative for abdominal pain, blood in stool, constipation, diarrhea, heartburn, melena, nausea and vomiting.  Genitourinary: Negative.   Musculoskeletal: Positive for back pain. Negative for falls, joint pain, myalgias and neck pain.  Skin: Negative for itching and rash.  Neurological: Positive for dizziness, speech change and weakness. Negative for tingling, tremors, sensory change, focal weakness, seizures, loss of consciousness and headaches.  Endo/Heme/Allergies: Bruises/bleeds easily.  Psychiatric/Behavioral: Positive for memory loss. The patient is not nervous/anxious and does not have insomnia.      Current treatment- s/p cycle 3 carbo+etopo+tecentriq; neulasta; zometa  Allergies  Allergen Reactions  . Iodine Anaphylaxis    IVP dye - has been premedicated previously without incidence. See 01/29/17 CT notes.  . Fentanyl     Overly sedated and groggy, ineffective      Past Medical History:  Diagnosis Date  . Anxiety   . Arthritis   . Asthma    COPD  . Cancer ([place])    Liver  . CAP (community acquired pneumonia) 06/30/2014  . Cardiomyopathy ([place])    nonischemic (EF 35-40%)  . CHF (congestive heart failure) ([place])   . COPD (chronic obstructive pulmonary disease) ([place])   . Depression   . Dyspnea   . GERD  (gastroesophageal reflux disease)   .  Hyperlipidemia   . Hypertension   . Pneumonia   . PVC's (premature ventricular contractions)    states 10 years ago  . Sepsis ([place]) 06/30/2014  . SOB (shortness of breath) 05/08/2014  . Spinal cord injury at T7-T12 level [facility])    2016    Past Surgical History:  Procedure Laterality Date  . APPENDECTOMY    . CARDIAC CATHETERIZATION    . CATARACT EXTRACTION     right eye   . CESAREAN SECTION    . COLONOSCOPY    . LAMINOTOMY    . LUMBAR LAMINECTOMY/DECOMPRESSION MICRODISCECTOMY Left 08/01/2016   Procedure: Left Lumbar Four-Five Laminectomy and Foraminotomy;  Surgeon: Earnie Larsson, MD;  Location: [place];  Service: Neurosurgery;  Laterality: Left;  . PORTA CATH INSERTION N/A 02/03/2017   Procedure: PORTA CATH INSERTION;  Surgeon: Katha Cabal, MD;  Location: [place] [facility];  Service: Cardiovascular;  Laterality: N/A;    Social History   Socioeconomic History  . Marital status: Married    Spouse name: Not on file  . Number of children: Not on file  . Years of education: Not on file  . Highest education level: Not on file  Social Needs  . Financial resource strain: Not on file  . Food insecurity - worry: Not on file  . Food insecurity - inability: Not on file  . Transportation needs - medical: Not on file  . Transportation needs - non-medical: Not on file  Occupational History  . Not on file  Tobacco Use  . Smoking status: Former Smoker    Packs/day: 0.50    Years: 50.00    Pack years: 25.00    Types: Cigarettes    Last attempt to quit: 01/27/2015    Years since quitting: 2.1  . Smokeless tobacco: Never Used  Substance and Sexual Activity  . Alcohol use: No  . Drug use: No  . Sexual activity: Not Currently  Other Topics Concern  . Not on file  Social History Narrative  . Not on file    Family History  Problem Relation Age of Onset  . Heart attack Father 34       MI x 3     Current Outpatient Medications:  .   albuterol (PROAIR HFA) 108 (90 Base) MCG/ACT inhaler, Inhale 2 puffs into the lungs every 6 (six) hours as needed for wheezing or shortness of breath. , Disp: , Rfl:  .  alendronate (FOSAMAX) 70 MG tablet, Take 70 mg by mouth every Thursday. , Disp: , Rfl:  .  ARIPiprazole (ABILIFY) 5 MG tablet, Take 5 mg by mouth daily., Disp: , Rfl:  .  Calcium Citrate-Vitamin D (CALCIUM + D PO), Take 1 tablet by mouth 2 (two) times daily., Disp: , Rfl:  .  DULoxetine (CYMBALTA) 60 MG capsule, Take 60 mg by mouth daily., Disp: , Rfl:  .  ipratropium-albuterol (DUONEB) 0.5-2.5 (3) MG/3ML SOLN, Take 3 mLs by nebulization every 4 (four) hours as needed., Disp: 360 mL, Rfl: 1 .  lidocaine-prilocaine (EMLA) cream, Apply to affected area once, Disp: 30 g, Rfl: 3 .  LORazepam (ATIVAN) 0.5 MG tablet, Take 0.5 mg by mouth daily as needed for anxiety. , Disp: , Rfl:  .  metoprolol succinate (TOPROL-XL) 100 MG 24 hr tablet, TAKE ONE TABLET BY MOUTH ONCE DAILY, Disp: 30 tablet, Rfl: 0 .  Omega-3 Fatty Acids (FISH OIL) 1000 MG CAPS, Take 1,000 mg by mouth daily., Disp: , Rfl:  .  ondansetron (ZOFRAN) 8  MG tablet, Take 1 tablet (8 mg total) by mouth 2 (two) times daily as needed for refractory nausea / vomiting., Disp: 30 tablet, Rfl: 2 .  oxyCODONE (OXYCONTIN) 20 mg 12 hr tablet, Take 1 tablet (20 mg total) by mouth every 12 (twelve) hours., Disp: 60 tablet, Rfl: 0 .  oxyCODONE-acetaminophen (PERCOCET/ROXICET) 5-325 MG tablet, Take 1-2 tablets by mouth every 6 (six) hours as needed. For pain., Disp: 120 tablet, Rfl: 0 .  potassium chloride SA (K-DUR,KLOR-CON) 20 MEQ tablet, Take 1 tablet (20 mEq total) by mouth 2 (two) times daily., Disp: 60 tablet, Rfl: 1 .  predniSONE (DELTASONE) 20 MG tablet, Take 2 tablets (40 mg total) by mouth daily., Disp: 10 tablet, Rfl: 0 .  prochlorperazine (COMPAZINE) 10 MG tablet, Take 1 tablet (10 mg total) by mouth every 6 (six) hours as needed (Nausea or vomiting)., Disp: 60 tablet, Rfl: 2 .   promethazine (PHENERGAN) 25 MG tablet, Take 1 tablet (25 mg total) by mouth every 6 (six) hours as needed for nausea or vomiting., Disp: 30 tablet, Rfl: 2 .  sacubitril-valsartan (ENTRESTO) 49-51 MG, Take 1 tablet by mouth 2 (two) times daily., Disp: 60 tablet, Rfl: 6 No current facility-administered medications for this visit.   Facility-Administered Medications Ordered in Other Visits:  .  sodium chloride flush (NS) 0.9 % injection 10 mL, 10 mL, Intravenous, PRN, Lloyd Huger, MD, 10 mL at 03/01/17 0841  Physical exam:  Vitals:   04/04/17 1440  BP: (!) 92/58  Pulse: 98  Resp: 20  Temp: (!) 96.8 F (36 C)  TempSrc: Tympanic   Physical Exam  Constitutional:  Obese, Frail, Chronically Ill Appearing. Accompanied by daughter  HENT:  Head: Atraumatic.  Mouth/Throat: Oropharynx is clear and moist.  alopecia  Eyes: Conjunctivae are normal. Pupils are equal, round, and reactive to light. No scleral icterus.  Neck: Normal range of motion. Neck supple.  Cardiovascular: Normal rate and regular rhythm.  Pulmonary/Chest: Effort normal. She has decreased breath sounds. She has no wheezes. She has no rhonchi. She has no rales.  Abdominal: Soft. Bowel sounds are normal.  Musculoskeletal: She exhibits no edema or deformity.  Neurological: She is alert. She has normal strength. She displays facial symmetry.  Skin: There is pallor.  Psychiatric: She exhibits abnormal recent memory and abnormal remote memory.     CMP Latest Ref Rng & Units 04/04/2017  Glucose 65 - 99 mg/dL 176(H)  BUN 6 - 20 mg/dL 46(H)  Creatinine 0.44 - 1.00 mg/dL 2.53(H)  Sodium 135 - 145 mmol/L 126(L)  Potassium 3.5 - 5.1 mmol/L 4.8  Chloride 101 - 111 mmol/L 95(L)  CO2 22 - 32 mmol/L 21(L)  Calcium 8.9 - 10.3 mg/dL 7.1(L)  Total Protein 6.5 - 8.1 g/dL 7.1  Total Bilirubin 0.3 - 1.2 mg/dL 0.4  Alkaline Phos 38 - 126 U/L 121  AST 15 - 41 U/L 20  ALT 14 - 54 U/L 29   CBC Latest Ref Rng & Units 04/04/2017  WBC  3.6 - 11.0 K/uL 23.7(H)  Hemoglobin 12.0 - 16.0 g/dL 6.6(L)  Hematocrit 35.0 - 47.0 % 19.7(L)  Platelets 150 - 440 K/uL 65(L)     Dg Chest 2 View  Result Date: 03/22/2017 CLINICAL DATA:  Shortness of breath.  Cough. EXAM: CHEST  2 VIEW COMPARISON:  Radiograph of February 20, 2017. PET scan of February 06, 2017. FINDINGS: Stable cardiomediastinal silhouette. Atherosclerosis of thoracic aorta is noted. No pneumothorax or pleural effusion is noted. Right internal jugular Port-A-Cath  is unchanged in position. Interval development of bilateral pulmonary nodules are noted concerning for metastatic disease. Bony thorax is unremarkable. IMPRESSION: Aortic atherosclerosis. Interval development of bilateral pulmonary nodules is noted concerning for metastatic disease. CT scan of the chest is recommended for further evaluation. Electronically Signed   By: Marijo Conception, M.D.   On: 03/22/2017 13:59   Ct Head Wo Contrast  Result Date: 04/04/2017 CLINICAL DATA:  67 year old female with altered level of consciousness. Small cell lung cancer. Last chemotherapy 1 week ago. Initial encounter. EXAM: CT HEAD WITHOUT CONTRAST TECHNIQUE: Contiguous axial images were obtained from the base of the skull through the vertex without intravenous contrast. COMPARISON:  02/04/2017 brain MR. FINDINGS: Brain: No intracranial hemorrhage or CT evidence of large acute infarct. Moderate chronic microvascular changes. Atrophy.  No intracranial mass lesion noted on this unenhanced exam. Vascular: Vascular calcifications Skull: No worrisome destructive lesion. Sinuses/Orbits: No acute orbital abnormality. Visualized paranasal sinuses clear. Other: Mastoid air cells and middle ear cavities are clear. IMPRESSION: No acute intracranial abnormality noted. Moderate chronic microvascular changes. Atrophy. No intracranial mass lesion noted on this unenhanced exam. No intracranial enhancing lesion noted on 02/04/2017 MR. If intracranial metastatic  disease were of high clinical concern, follow-up MR may then be considered. Electronically Signed   By: Genia Del M.D.   On: 04/04/2017 12:25     Assessment and plan- Patient is a 67 y.o. female who presents to [facility] today for altered mental status.   1.  Stage IV Lung- stage IV small cell lung cancer (cT4, cN3, pM1c) with known metastasis to liver and bone. PET scan confirmed based on 02/06/17 scans. MRI brain 02/04/17 neg for brain mets. Receiving Zometa for bone mets on odd numbered cycles. Currently s/p cycle 3 carbo+etopo+Tecentriq (03/22/17-03/24/17). CT C/A/P today are pending at this time. Plan was for patient to receive 4 cycles of chemotherapy prior to reimaging with PET. If improvement, continue Tecentriq for maintenance q3 weeks.   2.  Altered Mental Status- likely r/t hyponatremia but may represent progression. Previous MRI brain neg for mets. CT Head today neg for stroke. Continue to monitor.   3.  Anemia- Hmg 6.6, Hct 19.7. Likely d/t chemotherapy. Will need transfusion inpatient.  4.  Thrombocytopenia- Plt 65 today. Likely d/t chemotherapy. No evidence of active bleed.   5. AKI- Cr 2.53 (significantly elevated from baseline) BUN 46. BP 92/58, HR 98. Poor PO intake. Likely d/t dehydration. IV fluids in clinic. Hx of CHF. Monitor for fluid overload and continue to gently hydrate inpatient with repeat labs to monitor.   6. Hyponatremia- Na 126 today. Likely d/t poor PO intake. Continue IV fluids inpatient.   7. Leukocytosis- WBC 23.7, ANC 22.1. Neulasta on 03/24/17. Afebrile.   8. Goals of Care- overall poor prognosis. Patient has seen palliative care at home. Today, we discussed code status with her and family. 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 Patient requests to be full code at this time. Agreeable and feel she would benefit from Palliative Team Consult while inpatient.   Spoke to Dr. Charlena Cross, hospitalist,  who accepts for admission. Dr. Grayland Ormond, primary oncologist notified.    Visit Diagnosis 1. Small cell lung cancer ([place])   2. Altered mental status, unspecified altered mental status type   3. Anemia due to antineoplastic chemotherapy   4. Chemotherapy-induced thrombocytopenia   5. AKI (acute kidney injury) ([place])   6. Acute hyponatremia   7. Leukocytosis, unspecified  type   8. Counseling regarding advanced directives and goals of care     Patient expressed understanding and was in agreement with this plan. She also understands that She can call clinic at any time with any questions, concerns, or complaints.   A total of (30) minutes of face-to-face time was spent with this patient with greater than 50% of that time in counseling and care-coordination.  Beckey Rutter, DNP, AGNP-C [place] at [facility] [phone] (office) 04/04/17 4:33 PM

## 2017-04-04 NOTE — Telephone Encounter (Signed)
Daughter called to report that patient has hadf a severe cognitive decline and cannot even string sentences together this morning. She is currently awaiting her CT Scan of Chest Abdominal and pelvis. She reports that her mother has not eaten a solid meal in over a week and that she has been giving her Ensure shakes. She has had nausea. Appointment for labs and NP accepted for this afternoon. Per Ander Purpura, get CT Head without contrast. Ct agrees to add on test and Lauren putting in order for it

## 2017-04-04 NOTE — Progress Notes (Signed)
Patient here today as add on for weakness, tremors and changes in cognition noticed by daughter. Patient and daughter report decreased intake by mouth.

## 2017-04-04 NOTE — Telephone Encounter (Signed)
Thank you and agree.  Check and cbc and metc today as well.  ? Infection or possibly hyponatremia, hypercalcemia causing her symptoms.

## 2017-04-05 ENCOUNTER — Other Ambulatory Visit: Payer: Self-pay

## 2017-04-05 LAB — CBC
HEMATOCRIT: 18 % — AB (ref 35.0–47.0)
HEMOGLOBIN: 5.8 g/dL — AB (ref 12.0–16.0)
MCH: 30 pg (ref 26.0–34.0)
MCHC: 32.1 g/dL (ref 32.0–36.0)
MCV: 93.4 fL (ref 80.0–100.0)
Platelets: 68 10*3/uL — ABNORMAL LOW (ref 150–440)
RBC: 1.93 MIL/uL — ABNORMAL LOW (ref 3.80–5.20)
RDW: 15.9 % — AB (ref 11.5–14.5)
WBC: 26.8 10*3/uL — ABNORMAL HIGH (ref 3.6–11.0)

## 2017-04-05 LAB — BASIC METABOLIC PANEL
ANION GAP: 12 (ref 5–15)
BUN: 39 mg/dL — AB (ref 6–20)
CHLORIDE: 103 mmol/L (ref 101–111)
CO2: 19 mmol/L — AB (ref 22–32)
Calcium: 6.7 mg/dL — ABNORMAL LOW (ref 8.9–10.3)
Creatinine, Ser: 1.68 mg/dL — ABNORMAL HIGH (ref 0.44–1.00)
GFR calc Af Amer: 36 mL/min — ABNORMAL LOW (ref >60)
GFR calc non Af Amer: 31 mL/min — ABNORMAL LOW (ref >60)
GLUCOSE: 200 mg/dL — AB (ref 65–99)
POTASSIUM: 4 mmol/L (ref 3.5–5.1)
Sodium: 134 mmol/L — ABNORMAL LOW (ref 135–145)

## 2017-04-05 LAB — THYROID PANEL WITH TSH
Free Thyroxine Index: 2.4 (ref 1.2–4.9)
T3 Uptake Ratio: 33 % (ref 24–39)
T4 TOTAL: 7.3 ug/dL (ref 4.5–12.0)
TSH: 1.7 u[IU]/mL (ref 0.450–4.500)

## 2017-04-05 LAB — HEMOGLOBIN AND HEMATOCRIT, BLOOD
HCT: 20.4 % — ABNORMAL LOW (ref 35.0–47.0)
Hemoglobin: 6.4 g/dL — ABNORMAL LOW (ref 12.0–16.0)

## 2017-04-05 LAB — PREPARE RBC (CROSSMATCH)

## 2017-04-05 LAB — ABO/RH: ABO/RH(D): A POS

## 2017-04-05 LAB — GLUCOSE, CAPILLARY: GLUCOSE-CAPILLARY: 213 mg/dL — AB (ref 65–99)

## 2017-04-05 MED ORDER — ADULT MULTIVITAMIN W/MINERALS CH
1.0000 | ORAL_TABLET | Freq: Every day | ORAL | Status: DC
  Administered 2017-04-05 – 2017-04-09 (×5): 1 via ORAL
  Filled 2017-04-05 (×5): qty 1

## 2017-04-05 MED ORDER — IPRATROPIUM-ALBUTEROL 0.5-2.5 (3) MG/3ML IN SOLN
3.0000 mL | Freq: Four times a day (QID) | RESPIRATORY_TRACT | Status: DC
  Administered 2017-04-05 – 2017-04-09 (×15): 3 mL via RESPIRATORY_TRACT
  Filled 2017-04-05 (×16): qty 3

## 2017-04-05 MED ORDER — DULOXETINE HCL 60 MG PO CPEP
60.0000 mg | ORAL_CAPSULE | Freq: Every day | ORAL | Status: DC
  Administered 2017-04-05 – 2017-04-09 (×5): 60 mg via ORAL
  Filled 2017-04-05: qty 1
  Filled 2017-04-05: qty 2
  Filled 2017-04-05 (×2): qty 1
  Filled 2017-04-05 (×3): qty 2
  Filled 2017-04-05 (×2): qty 1
  Filled 2017-04-05: qty 2

## 2017-04-05 MED ORDER — ENSURE ENLIVE PO LIQD
237.0000 mL | Freq: Two times a day (BID) | ORAL | Status: DC
  Administered 2017-04-06 – 2017-04-09 (×7): 237 mL via ORAL

## 2017-04-05 MED ORDER — ARIPIPRAZOLE 5 MG PO TABS
5.0000 mg | ORAL_TABLET | Freq: Every day | ORAL | Status: DC
  Administered 2017-04-05 – 2017-04-09 (×5): 5 mg via ORAL
  Filled 2017-04-05 (×5): qty 1

## 2017-04-05 MED ORDER — SODIUM CHLORIDE 0.9 % IV SOLN
Freq: Once | INTRAVENOUS | Status: AC
  Administered 2017-04-05: 11:00:00 via INTRAVENOUS

## 2017-04-05 NOTE — Progress Notes (Addendum)
Initial Nutrition Assessment  DOCUMENTATION CODES:   Obesity unspecified  INTERVENTION:   Ensure Enlive po TID, each supplement provides 350 kcal and 20 grams of protein  Magic Cup TID at each meal  MVI daily  NUTRITION DIAGNOSIS:   Increased nutrient needs related to cancer and cancer related treatments as evidenced by estimated needs.   GOAL:   Patient will meet greater than or equal to 90% of their needs   MONITOR:   PO intake, Supplement acceptance, Labs, Weight trends, I & O's  REASON FOR ASSESSMENT:   Malnutrition Screening Tool    ASSESSMENT:   67 yo female admitted 2/19 with hyponatremia, AKI secondary to dehydration/poor PO, leukocytosis secondary to stage IV lung cancer with active chemotherapy.   Pt reported that her appetite was really poor PTA. She was drinking 2 Ensure/day but no other PO intake for past 5-6 days. Pt reports that she experienced a similar episode for a couple days in the past that went away without seeking medical care. Pt reports that she "knows better now" and will not be NPO without seeking medical treatment.   Discussed the importance of getting adequate protein to preserve muscle. Pt reports that her muscle strength has been declining for past few months prior to initiation of chemotherapy; however chemo has further contributed to her weakness. However, her weight has been stable over past three months. Pt also reports significant difficulty breathing.   Pt was encouraged to consume adequate protein and calories to maintain lean tissue. Encouraged pt to continue drinking Ensure. Pt also willing to try Magic Cup to increase protein intake at meals.    Wt Readings from Last 12 Encounters:  04/05/17 210 lb 12.8 oz (95.6 kg)  03/01/17 209 lb 1.6 oz (94.8 kg)  02/20/17 210 lb (95.3 kg)  02/15/17 212 lb (96.2 kg)  02/08/17 212 lb (96.2 kg)  02/03/17 212 lb (96.2 kg)  01/17/17 212 lb (96.2 kg)  12/27/16 224 lb (101.6 kg)  12/26/16 224  lb (101.6 kg)  12/07/16 224 lb (101.6 kg)  11/22/16 227 lb (103 kg)  11/21/16 227 lb 4.8 oz (103.1 kg)    Medications:  Colace Prednisone Zithromycin   Labs:  CBG 213 NA 134 (L)  CO2 19 (L)  WBC 26.8 (H)  HGB 5.8 (L)  HCT 18.0 (L)  PLT 68 (L)    NUTRITION - FOCUSED PHYSICAL EXAM:    Most Recent Value  Orbital Region  Severe depletion  Upper Arm Region  No depletion  Thoracic and Lumbar Region  No depletion  Buccal Region  No depletion  Temple Region  No depletion  Clavicle Bone Region  No depletion  Clavicle and Acromion Bone Region  No depletion  Scapular Bone Region  Mild depletion  Dorsal Hand  Moderate depletion  Patellar Region  Mild depletion  Anterior Thigh Region  Moderate depletion  Posterior Calf Region  Moderate depletion  Edema (RD Assessment)  None  Hair  Other (Comment) [hair loss d/t chemo]  Eyes  Reviewed  Mouth  Reviewed  Skin  Reviewed  Nails  Reviewed       Diet Order:  Diet regular Room service appropriate? Yes; Fluid consistency: Thin  EDUCATION NEEDS:   Education needs have been addressed  Skin:  Skin Assessment: Reviewed RN Assessment  Last BM:  2/20  Height:   Ht Readings from Last 1 Encounters:  04/04/17 5\' 5"  (1.651 m)    Weight:   Wt Readings from Last 1 Encounters:  04/05/17 210 lb 12.8 oz (95.6 kg)    Ideal Body Weight:  56.8 kg  BMI:  Body mass index is 35.08 kg/m.  Estimated Nutritional Needs:   Kcal:  2,000-2,100 kcal  Protein:  90-100 grams  Fluid:  2.0-2.1 L    Edmonia Lynch, MS Dietetic Intern Pager: [phone]

## 2017-04-05 NOTE — Plan of Care (Signed)
  Progressing Safety: Ability to remain free from injury will improve 04/05/2017 1335 - Progressing by Etheleen Nicks, RN Note Fall precautions in place, non skid socks when oob   Not Progressing Clinical Measurements: Will remain free from infection 04/05/2017 1335 - Not Progressing by Etheleen Nicks, RN Note Remains on IV antibiotics Diagnostic test results will improve 04/05/2017 1335 - Not Progressing by Etheleen Nicks, RN Note Hgb 5.8 this am, to receive 1 unit of prbc's today Respiratory complications will improve 04/05/2017 1335 - Not Progressing by Etheleen Nicks, RN Note Diminished bil, receiving nebulizer treatments

## 2017-04-05 NOTE — Progress Notes (Signed)
PRBC's hung per MD orders, educated pt on s/s of transfusion reaction.  Will monitor

## 2017-04-05 NOTE — Progress Notes (Addendum)
[place] at [place] NAME: Grace [name]    MR#:  [id number]  DATE OF BIRTH:  09-13-1950  SUBJECTIVE: Admitted yesterday for hyponatremia, started on IV fluids.  Patient still little confused.  Sodium improved.  Complains of severe fatigue and shortness of breath.  Has some cough and green phlegm.  Hemoglobin down to 5.8.  Discussed the risk and benefits of transfusion with patient and she agreed.  CHIEF COMPLAINT:  No chief complaint on file.   REVIEW OF SYSTEMS:   ROS CONSTITUTIONAL: No fever, some fatigue  eYES: No blurred or double vision.  EARS, NOSE, AND THROAT: No tinnitus or ear pain.  RESPIRATORY: Cough, shortness of breath, phlegm.  CARDIOVASCULAR: No chest pain, orthopnea, edema.  GASTROINTESTINAL: No nausea, vomiting, diarrhea or abdominal pain.  GENITOURINARY: No dysuria, hematuria.  ENDOCRINE: No polyuria, nocturia,  HEMATOLOGY: Anemia.  No easy bruise.  SKIN: No rash or lesion. MUSCULOSKELETAL: No joint pain or arthritis.   NEUROLOGIC: No tingling, numbness, weakness.  PSYCHIATRY: No anxiety or depression.   DRUG ALLERGIES:   Allergies  Allergen Reactions  . Iodine Anaphylaxis    IVP dye - has been premedicated previously without incidence. See 01/29/17 CT notes.  . Fentanyl     Overly sedated and groggy, ineffective     VITALS:  Blood pressure (!) 102/59, pulse 93, temperature 97.6 F (36.4 C), temperature source Oral, resp. rate 18, height 5\' 5"  (1.651 m), weight 95.6 kg (210 lb 12.8 oz), SpO2 100 %.  PHYSICAL EXAMINATION:  GENERAL:  67 y.o.-year-old patient lying in the bed with no acute distress.  Appears pale. EYES: Pupils equal, round, reactive to light . No scleral icterus. Extraocular muscles intact.  HEENT: Head atraumatic, normocephalic. Oropharynx and nasopharynx clear.  NECK:  Supple, no jugular venous distention. No thyroid enlargement, no tenderness.  LUNGS: Patient has faint expiratory wheeze  bilaterally. CARDIOVASCULAR: S1, S2 normal. No murmurs, rubs, or gallops.  ABDOMEN: Soft, nontender, nondistended. Bowel sounds present. No organomegaly or mass.  EXTREMITIES: No pedal edema, cyanosis, or clubbing.  NEUROLOGIC: Cranial nerves II through XII are intact. Muscle strength 5/5 in all extremities. Sensation intact. Gait not checked.  PSYCHIATRIC: The patient is alert and oriented x 3.  SKIN: No obvious rash, lesion, or ulcer.    LABORATORY PANEL:   CBC Recent Labs  Lab 04/05/17 0215  WBC 26.8*  HGB 5.8*  HCT 18.0*  PLT 68*   ------------------------------------------------------------------------------------------------------------------  Chemistries  Recent Labs  Lab 04/04/17 1359  04/05/17 0215  NA 126*   < > 134*  K 4.8   < > 4.0  CL 95*   < > 103  CO2 21*   < > 19*  GLUCOSE 176*   < > 200*  BUN 46*   < > 39*  CREATININE 2.53*   < > 1.68*  CALCIUM 7.1*   < > 6.7*  AST 20  --   --   ALT 29  --   --   ALKPHOS 121  --   --   BILITOT 0.4  --   --    < > = values in this interval not displayed.   ------------------------------------------------------------------------------------------------------------------  Cardiac Enzymes No results for input(s): TROPONINI in the last 168 hours. ------------------------------------------------------------------------------------------------------------------  RADIOLOGY:  Ct Head Wo Contrast  Result Date: 04/04/2017 CLINICAL DATA:  67 year old female with altered level of consciousness. Small cell lung cancer. Last chemotherapy 1 week ago. Initial encounter. EXAM: CT HEAD WITHOUT  CONTRAST TECHNIQUE: Contiguous axial images were obtained from the base of the skull through the vertex without intravenous contrast. COMPARISON:  02/04/2017 brain MR. FINDINGS: Brain: No intracranial hemorrhage or CT evidence of large acute infarct. Moderate chronic microvascular changes. Atrophy.  No intracranial mass lesion noted on this  unenhanced exam. Vascular: Vascular calcifications Skull: No worrisome destructive lesion. Sinuses/Orbits: No acute orbital abnormality. Visualized paranasal sinuses clear. Other: Mastoid air cells and middle ear cavities are clear. IMPRESSION: No acute intracranial abnormality noted. Moderate chronic microvascular changes. Atrophy. No intracranial mass lesion noted on this unenhanced exam. No intracranial enhancing lesion noted on 02/04/2017 MR. If intracranial metastatic disease were of high clinical concern, follow-up MR may then be considered. Electronically Signed   By: Genia Del M.D.   On: 04/04/2017 12:25   Ct Chest W Contrast  Result Date: 04/04/2017 CLINICAL DATA:  Small cell lung cancer EXAM: CT CHEST, ABDOMEN, AND PELVIS WITH CONTRAST TECHNIQUE: Multidetector CT imaging of the chest, abdomen and pelvis was performed following the standard protocol during bolus administration of intravenous contrast. CONTRAST:  1106mL ISOVUE-300 IOPAMIDOL (ISOVUE-300) INJECTION 61% COMPARISON:  PET-CT dated 02/06/2017. CT chest abdomen pelvis dated 01/19/2017. FINDINGS: CT CHEST FINDINGS Cardiovascular: The heart is top-normal in size. No pericardial effusion. No evidence of thoracic aortic aneurysm. Atherosclerotic calcifications of the aortic arch. Three vessel coronary atherosclerosis. Right chest port terminates at the cavoatrial junction. Mediastinum/Nodes: Thoracic lymphadenopathy, including 10 mm short axis high right paratracheal node (series 2/image 8) and a 10 mm short axis right hilar node (series 2/image 19), decreased. Visualized thyroid is unremarkable. Lungs/Pleura: Left upper lobe mass extending into the left perihilar region (series 2/image 15), difficult to discretely measure but significantly decreased, corresponding to the patient's known primary bronchogenic neoplasm. Bilateral pulmonary nodules, some of which are cavitary, new from recent PET. For example: --17 mm nodule in the posterior right  upper lobe (series 4/image 26) --18 mm cavitary nodule in the right lower lobe (series 4/image 52) --22 mm cavitary nodule in the right lower lobe (series 4/image 66) No pleural effusion or pneumothorax. Musculoskeletal: Severe compression fracture deformity at T5 with mild retropulsion. Mild superior endplate compression fracture deformity at T7. Mild superior endplate changes at R51. These findings are unchanged from the prior CT chest. CT ABDOMEN PELVIS FINDINGS Hepatobiliary: Multifocal hepatic metastases in both lobes, improved from prior CT. Dominant 3.2 by 4.0 cm mass in segment 4A (series 2/image 46), previously 7.1 x 6.5 cm. Distended gallbladder. No intrahepatic ductal dilatation. Dilated common duct, measuring 17 mm, although smoothly tapering at the ampulla. Pancreas: Within normal limits. Spleen: Within normal limits. Adrenals/Urinary Tract: Adrenal glands are within normal limits. Kidneys are within normal limits.  No hydronephrosis. Bladder is within normal limits. Stomach/Bowel: Stomach is notable for a tiny hiatal hernia. No evidence of bowel obstruction. Appendix is not discretely visualized. Mild sigmoid diverticulosis, without evidence of diverticulitis. Vascular/Lymphatic: No evidence of abdominal aortic aneurysm. Atherosclerotic calcifications of the abdominal aorta and branch vessels. No suspicious abdominopelvic lymphadenopathy. Dominant portacaval node measures 6 mm short axis. Reproductive: Uterus is within normal limits. Bilateral ovaries are within normal limits. Other: No abdominopelvic ascites. Musculoskeletal: Mild degenerative changes of the lumbar spine. IMPRESSION: Left upper lobe mass extending into the left perihilar region is significantly decreased from the prior, corresponding to the patient's known primary bronchogenic neoplasm. Mild thoracic nodal metastases, improved. Prior upper abdominal lymphadenopathy has resolved. New bilateral pulmonary nodules, some of which are  cavitary, worrisome for metastatic disease. Technically, infection could  also have a similar appearance, although this is considered unlikely. Improving hepatic metastases. Known osseous metastases are not well visualized on CT. Electronically Signed   By: Julian Hy M.D.   On: 04/04/2017 17:23   Ct Abdomen Pelvis W Contrast  Result Date: 04/04/2017 CLINICAL DATA:  Small cell lung cancer EXAM: CT CHEST, ABDOMEN, AND PELVIS WITH CONTRAST TECHNIQUE: Multidetector CT imaging of the chest, abdomen and pelvis was performed following the standard protocol during bolus administration of intravenous contrast. CONTRAST:  1108mL ISOVUE-300 IOPAMIDOL (ISOVUE-300) INJECTION 61% COMPARISON:  PET-CT dated 02/06/2017. CT chest abdomen pelvis dated 01/19/2017. FINDINGS: CT CHEST FINDINGS Cardiovascular: The heart is top-normal in size. No pericardial effusion. No evidence of thoracic aortic aneurysm. Atherosclerotic calcifications of the aortic arch. Three vessel coronary atherosclerosis. Right chest port terminates at the cavoatrial junction. Mediastinum/Nodes: Thoracic lymphadenopathy, including 10 mm short axis high right paratracheal node (series 2/image 8) and a 10 mm short axis right hilar node (series 2/image 19), decreased. Visualized thyroid is unremarkable. Lungs/Pleura: Left upper lobe mass extending into the left perihilar region (series 2/image 15), difficult to discretely measure but significantly decreased, corresponding to the patient's known primary bronchogenic neoplasm. Bilateral pulmonary nodules, some of which are cavitary, new from recent PET. For example: --17 mm nodule in the posterior right upper lobe (series 4/image 26) --18 mm cavitary nodule in the right lower lobe (series 4/image 52) --22 mm cavitary nodule in the right lower lobe (series 4/image 66) No pleural effusion or pneumothorax. Musculoskeletal: Severe compression fracture deformity at T5 with mild retropulsion. Mild superior endplate  compression fracture deformity at T7. Mild superior endplate changes at G25. These findings are unchanged from the prior CT chest. CT ABDOMEN PELVIS FINDINGS Hepatobiliary: Multifocal hepatic metastases in both lobes, improved from prior CT. Dominant 3.2 by 4.0 cm mass in segment 4A (series 2/image 46), previously 7.1 x 6.5 cm. Distended gallbladder. No intrahepatic ductal dilatation. Dilated common duct, measuring 17 mm, although smoothly tapering at the ampulla. Pancreas: Within normal limits. Spleen: Within normal limits. Adrenals/Urinary Tract: Adrenal glands are within normal limits. Kidneys are within normal limits.  No hydronephrosis. Bladder is within normal limits. Stomach/Bowel: Stomach is notable for a tiny hiatal hernia. No evidence of bowel obstruction. Appendix is not discretely visualized. Mild sigmoid diverticulosis, without evidence of diverticulitis. Vascular/Lymphatic: No evidence of abdominal aortic aneurysm. Atherosclerotic calcifications of the abdominal aorta and branch vessels. No suspicious abdominopelvic lymphadenopathy. Dominant portacaval node measures 6 mm short axis. Reproductive: Uterus is within normal limits. Bilateral ovaries are within normal limits. Other: No abdominopelvic ascites. Musculoskeletal: Mild degenerative changes of the lumbar spine. IMPRESSION: Left upper lobe mass extending into the left perihilar region is significantly decreased from the prior, corresponding to the patient's known primary bronchogenic neoplasm. Mild thoracic nodal metastases, improved. Prior upper abdominal lymphadenopathy has resolved. New bilateral pulmonary nodules, some of which are cavitary, worrisome for metastatic disease. Technically, infection could also have a similar appearance, although this is considered unlikely. Improving hepatic metastases. Known osseous metastases are not well visualized on CT. Electronically Signed   By: Julian Hy M.D.   On: 04/04/2017 17:23    EKG:    Orders placed or performed in visit on 10/24/16  . EKG 12-Lead    ASSESSMENT AND PLAN:  Mental status secondary to hyponatremia: Improved with hydration CT head is unremarkable for any metastatic disease.  Sodium improved from 126-134. 2.  Acute kidney injury due to dehydration and poor p.o. intake causing ATN: Improved with IV  fluids. 3.  Symptomatic anemia: Hemoglobin 5.8, patient feels both fatigue and shortness of breath.  DC IV fluids, start 1 unit of blood transfusion, discussed the risks and benefits of transfusion with patient she agreed for blood.  I spoke with registered nurse that patient can get 1 unit of RBC. 4.  Anemia, thrombocytopenia secondary to chemotherapy: Platelets stable around 68, no evidence of bleeding. 5.  Leukocytosis secondary to lung cancer, CT chest showing multiple metastases bilaterally.  Patient has COPD exacerbation: Started IV steroids, empiric antibiotics for bronchitis.  Patient still thinks she has pneumonia.  We are treating him for the pneumonia and also about CT chest shows more of metastatic disease rather than pneumonia.  Appreciate Dr. Grayland Ormond following the CT chest and follow-up talking to the patient. 6.  Stage IV lung cancer with metastases to liver and bone and CT chest confirming worsening metastases to lungs also. #7 depression: Restart Cymbalta, Abilify from today.  Not started on admission because patient had altered mental status due to hyponatremia on admission. 8.  Hypotension improved, BP still soft so hold metoprolol, Entresto.   discussed with patient's daughter yesterday.  All the records are reviewed and case discussed with Care Management/Social Workerr. Management plans discussed with the patient, family and they are in agreement.  CODE STATUS: Full code  TOTAL TIME TAKING CARE OF THIS PATIENT: 35 minutes.   POSSIBLE D/C IN 1-2DAYS, DEPENDING ON CLINICAL CONDITION.   Epifanio Lesches M.D on 04/05/2017 at 11:34  AM  Between 7am to 6pm - Pager - [phone]  After 6pm go to [email or web address] - password [email or web address] [facility]  Office  [phone]  CC: Primary care physician; [name], MD   Note: This dictation was prepared with Dragon dictation along with smaller phrase technology. Any transcriptional errors that result from this process are unintentional.

## 2017-04-05 NOTE — Progress Notes (Signed)
Inpatient Diabetes Program Recommendations  AACE/ADA: New Consensus Statement on Inpatient Glycemic Control (2015)  Target Ranges:  Prepandial:   less than 140 mg/dL      Peak postprandial:   less than 180 mg/dL (1-2 hours)      Critically ill patients:  140 - 180 mg/dL   Results for ATHLEEN, FELTNER (MRN [id number]) as of 04/05/2017 11:33  Ref. Range 04/05/2017 02:15  Glucose Latest Ref Range: 65 - 99 mg/dL 200 (H)    Admit with: Stage IV small cell lung cancer followed by Dr. Grayland Ormond currently on chemotherapy sent in from cancer center for altered mental status with weakness, poor p.o. intake.  Found to have hyponatremia.   NO History of DM noted.     MD- Note patient does not have History of DM.  Getting Solumedrol 60 mg Q24 hours.  Glucose level elevated to 200 mg/dl this AM.  Please consider placing orders for CBG checks and Novolog Sensitive Correction Scale/ SSI (0-9 units) TID AC + HS      --Will follow patient during hospitalization--  Wyn Quaker RN, MSN, CDE Diabetes Coordinator Inpatient Glycemic Control Team Team Pager: [phone] (8a-5p)

## 2017-04-06 ENCOUNTER — Telehealth: Payer: Self-pay | Admitting: Cardiovascular Disease

## 2017-04-06 DIAGNOSIS — D6481 Anemia due to antineoplastic chemotherapy: Secondary | ICD-10-CM

## 2017-04-06 DIAGNOSIS — K219 Gastro-esophageal reflux disease without esophagitis: Secondary | ICD-10-CM

## 2017-04-06 DIAGNOSIS — E785 Hyperlipidemia, unspecified: Secondary | ICD-10-CM

## 2017-04-06 DIAGNOSIS — R918 Other nonspecific abnormal finding of lung field: Secondary | ICD-10-CM

## 2017-04-06 DIAGNOSIS — E871 Hypo-osmolality and hyponatremia: Principal | ICD-10-CM

## 2017-04-06 DIAGNOSIS — I509 Heart failure, unspecified: Secondary | ICD-10-CM

## 2017-04-06 DIAGNOSIS — I255 Ischemic cardiomyopathy: Secondary | ICD-10-CM

## 2017-04-06 DIAGNOSIS — C787 Secondary malignant neoplasm of liver and intrahepatic bile duct: Secondary | ICD-10-CM

## 2017-04-06 DIAGNOSIS — D696 Thrombocytopenia, unspecified: Secondary | ICD-10-CM

## 2017-04-06 DIAGNOSIS — Z87891 Personal history of nicotine dependence: Secondary | ICD-10-CM

## 2017-04-06 DIAGNOSIS — Z9221 Personal history of antineoplastic chemotherapy: Secondary | ICD-10-CM

## 2017-04-06 DIAGNOSIS — J449 Chronic obstructive pulmonary disease, unspecified: Secondary | ICD-10-CM

## 2017-04-06 DIAGNOSIS — Z66 Do not resuscitate: Secondary | ICD-10-CM

## 2017-04-06 DIAGNOSIS — C3490 Malignant neoplasm of unspecified part of unspecified bronchus or lung: Secondary | ICD-10-CM

## 2017-04-06 DIAGNOSIS — F329 Major depressive disorder, single episode, unspecified: Secondary | ICD-10-CM

## 2017-04-06 DIAGNOSIS — R413 Other amnesia: Secondary | ICD-10-CM

## 2017-04-06 DIAGNOSIS — M129 Arthropathy, unspecified: Secondary | ICD-10-CM

## 2017-04-06 DIAGNOSIS — R419 Unspecified symptoms and signs involving cognitive functions and awareness: Secondary | ICD-10-CM

## 2017-04-06 DIAGNOSIS — I110 Hypertensive heart disease with heart failure: Secondary | ICD-10-CM

## 2017-04-06 DIAGNOSIS — N179 Acute kidney failure, unspecified: Secondary | ICD-10-CM

## 2017-04-06 LAB — CBC
HCT: 20.7 % — ABNORMAL LOW (ref 35.0–47.0)
Hemoglobin: 6.6 g/dL — ABNORMAL LOW (ref 12.0–16.0)
MCH: 29.5 pg (ref 26.0–34.0)
MCHC: 31.7 g/dL — AB (ref 32.0–36.0)
MCV: 93.2 fL (ref 80.0–100.0)
PLATELETS: 90 10*3/uL — AB (ref 150–440)
RBC: 2.22 MIL/uL — ABNORMAL LOW (ref 3.80–5.20)
RDW: 16.5 % — AB (ref 11.5–14.5)
WBC: 34.1 10*3/uL — ABNORMAL HIGH (ref 3.6–11.0)

## 2017-04-06 LAB — PREPARE RBC (CROSSMATCH)

## 2017-04-06 LAB — GLUCOSE, CAPILLARY: Glucose-Capillary: 173 mg/dL — ABNORMAL HIGH (ref 65–99)

## 2017-04-06 MED ORDER — SODIUM CHLORIDE 0.9 % IV SOLN
Freq: Once | INTRAVENOUS | Status: AC
  Administered 2017-04-06: 16:00:00 via INTRAVENOUS

## 2017-04-06 MED ORDER — METHYLPREDNISOLONE SODIUM SUCC 40 MG IJ SOLR
30.0000 mg | Freq: Two times a day (BID) | INTRAMUSCULAR | Status: DC
  Administered 2017-04-06 – 2017-04-09 (×7): 30 mg via INTRAVENOUS
  Filled 2017-04-06 (×7): qty 1

## 2017-04-06 MED ORDER — LIDOCAINE VISCOUS 2 % MT SOLN
2.0000 mL | Freq: Three times a day (TID) | OROMUCOSAL | Status: DC
  Administered 2017-04-06 – 2017-04-09 (×4): 2 mL via OROMUCOSAL
  Filled 2017-04-06 (×11): qty 5

## 2017-04-06 MED ORDER — SODIUM CHLORIDE 0.9% FLUSH
10.0000 mL | Freq: Two times a day (BID) | INTRAVENOUS | Status: DC
  Administered 2017-04-07 – 2017-04-09 (×6): 10 mL

## 2017-04-06 MED ORDER — SODIUM CHLORIDE 0.9% FLUSH
10.0000 mL | INTRAVENOUS | Status: DC | PRN
  Administered 2017-04-07: 10 mL
  Filled 2017-04-06: qty 40

## 2017-04-06 MED ORDER — MAGIC MOUTHWASH W/LIDOCAINE
2.0000 mL | Freq: Three times a day (TID) | ORAL | Status: DC
  Filled 2017-04-06 (×2): qty 5

## 2017-04-06 MED ORDER — AZITHROMYCIN 500 MG PO TABS
500.0000 mg | ORAL_TABLET | Freq: Every day | ORAL | Status: DC
  Administered 2017-04-06 – 2017-04-08 (×3): 500 mg via ORAL
  Filled 2017-04-06 (×3): qty 1

## 2017-04-06 MED ORDER — MAGIC MOUTHWASH
2.0000 mL | Freq: Three times a day (TID) | ORAL | Status: DC
  Administered 2017-04-06 – 2017-04-09 (×6): 2 mL via ORAL
  Filled 2017-04-06 (×6): qty 10

## 2017-04-06 NOTE — Plan of Care (Signed)
  Progressing Education: Knowledge of General Education information will improve 04/06/2017 1839 - Progressing by Daylene Posey, RN Health Behavior/Discharge Planning: Ability to manage health-related needs will improve 04/06/2017 1839 - Progressing by Daylene Posey, RN Clinical Measurements: Ability to maintain clinical measurements within normal limits will improve 04/06/2017 1839 - Progressing by Daylene Posey, RN Will remain free from infection 04/06/2017 1839 - Progressing by Daylene Posey, RN Diagnostic test results will improve 04/06/2017 1839 - Progressing by Daylene Posey, RN Respiratory complications will improve 04/06/2017 1839 - Progressing by Daylene Posey, RN Cardiovascular complication will be avoided 04/06/2017 1839 - Progressing by Daylene Posey, RN Activity: Risk for activity intolerance will decrease 04/06/2017 1839 - Progressing by Daylene Posey, RN Nutrition: Adequate nutrition will be maintained 04/06/2017 1839 - Progressing by Daylene Posey, RN Coping: Level of anxiety will decrease 04/06/2017 1839 - Progressing by Daylene Posey, RN Elimination: Will not experience complications related to bowel motility 04/06/2017 1839 - Progressing by Daylene Posey, RN Will not experience complications related to urinary retention 04/06/2017 1839 - Progressing by Daylene Posey, RN Pain Managment: General experience of comfort will improve 04/06/2017 1839 - Progressing by Daylene Posey, RN Safety: Ability to remain free from injury will improve 04/06/2017 1839 - Progressing by Daylene Posey, RN Skin Integrity: Risk for impaired skin integrity will decrease 04/06/2017 1839 - Progressing by Daylene Posey, RN

## 2017-04-06 NOTE — Progress Notes (Signed)
Family Meeting Note  Advance Directive: No    Today a meeting took place with the Patient.  Patient told me that she has DNR papers signed but did not give it to palliative care nurse because patient's daughter got so mad and she did not want to support the daughter.  I told the patient about her terminal lung cancer and need for discussing about DNR.   The following clinical team members were present during this meeting:MD  The following were discussed:Patient's diagnosis: , Patient's progosis: Unable to determine and Goals for treatment: Continue present management  Additional follow-up to be provided:, DNR status.  The patient used to be ICU nurse for long time, she knows about DNR status  Time spent during discussion:15 minutes  Grace Lesches, MD

## 2017-04-06 NOTE — Progress Notes (Signed)
[place] at [place] NAME: Grace [name]    MR#:  [id number]  DATE OF BIRTH:  01/16/1951  SUBJECTIVE: Alyse Low Magic mouthwash and also splitting the dose of prednisone.Marland Kitchen  He still feels really weak given to use bedside commode.  Hemoglobin still around 6.6 .  It was 5.8 yesterday.  Received 1 unit of PRBC transfusion   CHIEF COMPLAINT:  No chief complaint on file.   REVIEW OF SYSTEMS:   ROS CONSTITUTIONAL: No fever, some fatigue  eYES: No blurred or double vision.  EARS, NOSE, AND THROAT: No tinnitus or ear pain.  RESPIRATORY: Cough, shortness of breath, phlegm.  CARDIOVASCULAR: No chest pain, orthopnea, edema.  GASTROINTESTINAL: No nausea, vomiting, diarrhea or abdominal pain.  GENITOURINARY: No dysuria, hematuria.  ENDOCRINE: No polyuria, nocturia,  HEMATOLOGY: Anemia.  No easy bruise.  SKIN: No rash or lesion. MUSCULOSKELETAL: No joint pain or arthritis.   NEUROLOGIC: No tingling, numbness, weakness.  PSYCHIATRY: No anxiety or depression.   DRUG ALLERGIES:   Allergies  Allergen Reactions  . Iodine Anaphylaxis    IVP dye - has been premedicated previously without incidence. See 01/29/17 CT notes.  . Fentanyl     Overly sedated and groggy, ineffective     VITALS:  Blood pressure (!) 98/55, pulse (!) 121, temperature 97.9 F (36.6 C), temperature source Oral, resp. rate 18, height 5\' 5"  (1.651 m), weight 95.6 kg (210 lb 12.8 oz), SpO2 95 %.  PHYSICAL EXAMINATION:  GENERAL:  67 y.o.-year-old patient lying in the bed with no acute distress.   EYES: Pupils equal, round, reactive to light . No scleral icterus. Extraocular muscles intact.  HEENT: Head atraumatic, normocephalic. Oropharynx and nasopharynx clear.  NECK:  Supple, no jugular venous distention. No thyroid enlargement, no tenderness.  LUNGS: Patient has faint expiratory wheeze bilaterally. CARDIOVASCULAR: S1, S2 normal. No murmurs, rubs, or gallops.  ABDOMEN: Soft,  nontender, nondistended. Bowel sounds present. No organomegaly or mass.  EXTREMITIES: No pedal edema, cyanosis, or clubbing.  NEUROLOGIC: Cranial nerves II through XII are intact. Muscle strength 5/5 in all extremities. Sensation intact. Gait not checked.  PSYCHIATRIC: The patient is alert and oriented x 3.  SKIN: No obvious rash, lesion, or ulcer.    LABORATORY PANEL:   CBC Recent Labs  Lab 04/06/17 0316  WBC 34.1*  HGB 6.6*  HCT 20.7*  PLT 90*   ------------------------------------------------------------------------------------------------------------------  Chemistries  Recent Labs  Lab 04/04/17 1359  04/05/17 0215  NA 126*   < > 134*  K 4.8   < > 4.0  CL 95*   < > 103  CO2 21*   < > 19*  GLUCOSE 176*   < > 200*  BUN 46*   < > 39*  CREATININE 2.53*   < > 1.68*  CALCIUM 7.1*   < > 6.7*  AST 20  --   --   ALT 29  --   --   ALKPHOS 121  --   --   BILITOT 0.4  --   --    < > = values in this interval not displayed.   ------------------------------------------------------------------------------------------------------------------  Cardiac Enzymes No results for input(s): TROPONINI in the last 168 hours. ------------------------------------------------------------------------------------------------------------------  RADIOLOGY:  No results found.  EKG:   Orders placed or performed in visit on 10/24/16  . EKG 12-Lead    ASSESSMENT AND PLAN:  Mental status secondary to hyponatremia: Improved with hydration CT head is unremarkable for any metastatic disease.  Sodium improved from 126-134. 2.  Acute kidney injury due to dehydration and poor p.o. intake causing ATN: Improved with IV fluids. 3.  Symptomatic anemia: Hemoglobin 5.8, patient feels both fatigue and shortness of breath.  DC IV fluids, start 1 unit of blood transfusion, discussed the risks and benefits of transfusion with patient she agreed for blood.  I spoke with registered nurse that patient can get 1  unit of RBC. 4.  Anemia, thrombocytopenia secondary to chemotherapy: Platelets stable around 68, no evidence of bleeding. 5.  Leukocytosis secondary to lung cancer, CT chest showing multiple metastases bilaterally.  Patient has COPD exacerbation: Started IV steroids, empiric antibiotics for bronchitis.  Patient still thinks she has pneumonia.  We are treating him for the pneumonia and also about CT chest shows more of metastatic disease rather than pneumonia.  Appreciate Dr. Grayland Ormond following the CT chest and follow-up talking to the patient. 6.  Stage IV lung cancer with metastases to liver and bone and CT chest confirming worsening metastases to lungs also. #7 depression: Restart Cymbalta, Abilify from today.  Not started on admission because patient had altered mental status due to hyponatremia on admission. 8.  Hypotension improved, BP still soft so hold metoprolol, Entresto. #9 mouth ulcers: Started Magic mouthwash. 10.  Deconditioning, fatigue: Physical therapy consulted. 11.  Shaking, jitteriness and wanted to split the steroid dosing. 12.  Hyperglycemia due to steroids, start sliding scale insulin with coverage.   All the records are reviewed and case discussed with Care Management/Social Workerr. Management plans discussed with the patient, family and they are in agreement.  CODE STATUS: Full code  TOTAL TIME TAKING CARE OF THIS PATIENT: 35 minutes.   POSSIBLE D/C IN 1-2DAYS, DEPENDING ON CLINICAL CONDITION.   Epifanio Lesches M.D on 04/06/2017 at 12:55 PM  Between 7am to 6pm - Pager - [phone]  After 6pm go to [email or web address] - password [email or web address] [facility]  Office  [phone]  CC: Primary care physician; [name], MD   Note: This dictation was prepared with Dragon dictation along with smaller phrase technology. Any transcriptional errors that result from this process are unintentional.

## 2017-04-06 NOTE — Care Management Important Message (Signed)
Important Message  Patient Details  Name: Grace [name] MRN: [id number] Date of Birth: January 10, 1951   Medicare Important Message Given:  Yes    Shelbie Ammons, RN 04/06/2017, 7:15 AM

## 2017-04-06 NOTE — Plan of Care (Signed)
  Progressing Education: Knowledge of General Education information will improve 04/06/2017 0459 - Progressing by Denice Bors, RN Health Behavior/Discharge Planning: Ability to manage health-related needs will improve 04/06/2017 0459 - Progressing by Denice Bors, RN Clinical Measurements: Ability to maintain clinical measurements within normal limits will improve 04/06/2017 0459 - Progressing by Denice Bors, RN Will remain free from infection 04/06/2017 0459 - Progressing by Denice Bors, RN Diagnostic test results will improve 04/06/2017 0459 - Progressing by Denice Bors, RN Respiratory complications will improve 04/06/2017 0459 - Progressing by Denice Bors, RN Cardiovascular complication will be avoided 04/06/2017 0459 - Progressing by Denice Bors, RN Activity: Risk for activity intolerance will decrease 04/06/2017 0459 - Progressing by Denice Bors, RN Nutrition: Adequate nutrition will be maintained 04/06/2017 0459 - Progressing by Denice Bors, RN Coping: Level of anxiety will decrease 04/06/2017 0459 - Progressing by Denice Bors, RN Elimination: Will not experience complications related to bowel motility 04/06/2017 0459 - Progressing by Denice Bors, RN Will not experience complications related to urinary retention 04/06/2017 0459 - Progressing by Denice Bors, RN Pain Managment: General experience of comfort will improve 04/06/2017 0459 - Progressing by Denice Bors, RN Safety: Ability to remain free from injury will improve 04/06/2017 0459 - Progressing by Denice Bors, RN Skin Integrity: Risk for impaired skin integrity will decrease 04/06/2017 0459 - Progressing by Denice Bors, RN

## 2017-04-06 NOTE — Progress Notes (Signed)
PT Cancellation Note  Patient Details Name: Grace [name] MRN: [id number] DOB: 10/30/1950   Cancelled Treatment:    Reason Eval/Treat Not Completed: Medical issues which prohibited therapy.  Pt with Hgb 6.6.  Plan is to receive another unit of blood. Will hold PT until pt more medically appropriate.     Collie Siad PT, DPT 04/06/2017, 2:35 PM

## 2017-04-06 NOTE — Telephone Encounter (Signed)
Spoke with patient and she stated that she has been admitted to [facility]. She wanted Dr. Rockey Situ to be aware because she has been admitted for cardiopulmonary problems. Advised that I would make him aware and to give [place] a call if she should have any further concerns. She verbalized understanding with no further questions at this time.

## 2017-04-06 NOTE — Care Management Note (Signed)
Case Management Note  Patient Details  Name: Grace [name] MRN: [id number] Date of Birth: 26-Apr-1950  Subjective/Objective: Admitted to [facility] with the diagnosis of hyponatremia. Lives with husband. Last seen Dr. Kary Kos 2 months ago. Daughter is [name] [phone]). Prescriptions are filled at [facility] on [address].   No home health. No skilled facility. No home oxygen. Bedside commode, rolling walker, shower chair, grab bars and wheelchair in the home. Self feed, daughter helps with dressing and baths. Semi bed bound. No falls. Good appetite. Daughter will transport                 Action/Plan: Personal Care service information given   Expected Discharge Date:  04/07/17               Expected Discharge Plan:     In-House Referral:   yes  Discharge planning Services   yes  Post Acute Care Choice:    Choice offered to:     DME Arranged:    DME Agency:     HH Arranged:    [place] Agency:     Status of Service:     If discussed at [employer], dates discussed:    Additional Comments:  Shelbie Ammons, RN MSN CCM Care Management [phone] 04/06/2017, 12:14 PM

## 2017-04-06 NOTE — Progress Notes (Signed)
Please note patient is currently followed by out patient PALLIATIVE at home. [place] made aware.  Flo Shanks RN, BSN, [place] [facility] [place], hospital Liaison [phone]

## 2017-04-06 NOTE — Consult Note (Signed)
[place]  [phone] [phone]  ID: [name] OB: May 30, 1950  MR#: [id number]  DVV#:[id number]  Patient Care Team: [name], MD as PCP - General (Family Medicine) [name], RN as Registered Nurse  CHIEF COMPLAINT: Stage IV small cell lung carcinoma.  Hyponatremia, acute renal failure, symptomatic anemia.  INTERVAL HISTORY: Patient is a 67 year old [name] is actively receiving chemotherapy for stage IV small cell lung carcinoma who was admitted to the hospital from the oncology clinic with confusion, severe hyponatremia, acute renal failure, and symptomatic anemia.  She feels significantly improved since admission, but not back to her baseline.  She still complains of some mild confusion.  She has no neurologic complaints.  She denies any fevers.  She has significant weakness and fatigue with a decreased performance status.  She denies any chest pain or shortness of breath.  She has a poor appetite, but denies any nausea, vomiting, constipation, or diarrhea.  She has no urinary complaints.  Patient otherwise feels well and offers no further specific complaints.  REVIEW OF SYSTEMS:   Review of Systems  Constitutional: Positive for malaise/fatigue. Negative for fever and weight loss.  Respiratory: Negative.  Negative for cough, hemoptysis and shortness of breath.   Cardiovascular: Negative.  Negative for chest pain and leg swelling.  Gastrointestinal: Negative.  Negative for abdominal pain, blood in stool and melena.  Genitourinary: Negative.  Negative for dysuria.  Musculoskeletal: Negative.   Skin: Negative.  Negative for rash.  Neurological: Positive for weakness.  Psychiatric/Behavioral: Positive for memory loss.    As per HPI. Otherwise, a complete review of systems is negative.  PAST MEDICAL HISTORY: Past Medical History:  Diagnosis Date  . Anxiety   . Arthritis   . Asthma    COPD  . Cancer ([place])    Liver  . CAP  (community acquired pneumonia) 06/30/2014  . Cardiomyopathy ([place])    nonischemic (EF 35-40%)  . CHF (congestive heart failure) ([place])   . COPD (chronic obstructive pulmonary disease) ([place])   . Depression   . Dyspnea   . GERD (gastroesophageal reflux disease)   . Hyperlipidemia   . Hypertension   . Pneumonia   . PVC's (premature ventricular contractions)    states 10 years ago  . Sepsis ([place]) 06/30/2014  . SOB (shortness of breath) 05/08/2014  . Spinal cord injury at T7-T12 level [facility])    2016    PAST SURGICAL HISTORY: Past Surgical History:  Procedure Laterality Date  . APPENDECTOMY    . CARDIAC CATHETERIZATION    . CATARACT EXTRACTION     right eye   . CESAREAN SECTION    . COLONOSCOPY    . LAMINOTOMY    . LUMBAR LAMINECTOMY/DECOMPRESSION MICRODISCECTOMY Left 08/01/2016   Procedure: Left Lumbar Four-Five Laminectomy and Foraminotomy;  Surgeon: Earnie Larsson, MD;  Location: [facility];  Service: Neurosurgery;  Laterality: Left;  . PORTA CATH INSERTION N/A 02/03/2017   Procedure: PORTA CATH INSERTION;  Surgeon: Katha Cabal, MD;  Location: [place] [facility];  Service: Cardiovascular;  Laterality: N/A;    FAMILY HISTORY: Family History  Problem Relation Age of Onset  . Heart attack Father 4       MI x 3     ADVANCED DIRECTIVES (Y/N):  @ADVDIR @  HEALTH MAINTENANCE: Social History   Tobacco Use  . Smoking status: Former Smoker    Packs/day: 0.50    Years: 50.00    Pack years: 25.00    Types: Cigarettes  Last attempt to quit: 01/27/2015    Years since quitting: 2.1  . Smokeless tobacco: Never Used  Substance Use Topics  . Alcohol use: No  . Drug use: No     Colonoscopy:  PAP:  Bone density:  Lipid panel:  Allergies  Allergen Reactions  . Iodine Anaphylaxis    IVP dye - has been premedicated previously without incidence. See 01/29/17 CT notes.  . Fentanyl     Overly sedated and groggy, ineffective     Current Facility-Administered Medications    Medication Dose Route Frequency Provider Last Rate Last Dose  . acetaminophen (TYLENOL) tablet 650 mg  650 mg Oral Q6H PRN Epifanio Lesches, MD       Or  . acetaminophen (TYLENOL) suppository 650 mg  650 mg Rectal Q6H PRN Epifanio Lesches, MD      . ARIPiprazole (ABILIFY) tablet 5 mg  5 mg Oral Daily Epifanio Lesches, MD   5 mg at 04/06/17 0944  . azithromycin (ZITHROMAX) tablet 500 mg  500 mg Oral Daily Epifanio Lesches, MD   500 mg at 04/06/17 1751  . bisacodyl (DULCOLAX) EC tablet 5 mg  5 mg Oral Daily PRN Epifanio Lesches, MD      . cefTRIAXone (ROCEPHIN) 1 g in sodium chloride 0.9 % 100 mL IVPB  1 g Intravenous Q24H Epifanio Lesches, MD 200 mL/hr at 04/06/17 2030 1 g at 04/06/17 2030  . docusate sodium (COLACE) capsule 100 mg  100 mg Oral BID Epifanio Lesches, MD   100 mg at 04/06/17 0944  . DULoxetine (CYMBALTA) DR capsule 60 mg  60 mg Oral Daily Epifanio Lesches, MD   60 mg at 04/06/17 0944  . feeding supplement (ENSURE ENLIVE) (ENSURE ENLIVE) liquid 237 mL  237 mL Oral BID BM Epifanio Lesches, MD   237 mL at 04/06/17 1442  . heparin injection 5,000 Units  5,000 Units Subcutaneous Q8H Epifanio Lesches, MD   5,000 Units at 04/06/17 1441  . ipratropium-albuterol (DUONEB) 0.5-2.5 (3) MG/3ML nebulizer solution 3 mL  3 mL Nebulization Q6H Epifanio Lesches, MD   3 mL at 04/06/17 2002  . magic mouthwash  2 mL Oral TID Epifanio Lesches, MD   2 mL at 04/06/17 1650   And  . lidocaine (XYLOCAINE) 2 % viscous mouth solution 2 mL  2 mL Mouth/Throat TID Epifanio Lesches, MD   2 mL at 04/06/17 1651  . methylPREDNISolone sodium succinate (SOLU-MEDROL) 40 mg/mL injection 30 mg  30 mg Intravenous Q12H Epifanio Lesches, MD   30 mg at 04/06/17 1246  . morphine 4 MG/ML injection 4 mg  4 mg Intravenous Q6H PRN Demetrios Loll, MD      . multivitamin with minerals tablet 1 tablet  1 tablet Oral Daily Epifanio Lesches, MD   1 tablet at 04/06/17 0944  .  ondansetron (ZOFRAN) tablet 4 mg  4 mg Oral Q6H PRN Epifanio Lesches, MD       Or  . ondansetron (ZOFRAN) injection 4 mg  4 mg Intravenous Q6H PRN Epifanio Lesches, MD   4 mg at 04/05/17 1812  . oxyCODONE-acetaminophen (PERCOCET/ROXICET) 5-325 MG per tablet 2 tablet  2 tablet Oral Q6H PRN Demetrios Loll, MD   2 tablet at 04/06/17 1751  . traZODone (DESYREL) tablet 25 mg  25 mg Oral QHS PRN Epifanio Lesches, MD   25 mg at 04/05/17 2148   Facility-Administered Medications Ordered in Other Encounters  Medication Dose Route Frequency Provider Last Rate Last Dose  . sodium chloride flush (NS) 0.9 %  injection 10 mL  10 mL Intravenous PRN Lloyd Huger, MD   10 mL at 03/01/17 0841    OBJECTIVE: Vitals:   04/06/17 2003 04/06/17 2035  BP:  125/66  Pulse: (!) 104 (!) 105  Resp: 16 15  Temp:  98.1 F (36.7 C)  SpO2: 95% 96%     Body mass index is 35.08 kg/m.    ECOG FS:2 - Symptomatic, <50% confined to bed  General: Ill-appearing, no acute distress.  Sitting in bed. Eyes: Pink conjunctiva, anicteric sclera. HEENT: Normocephalic, moist mucous membranes, clear oropharnyx. Lungs: Clear to auscultation bilaterally. Heart: Regular rate and rhythm. No rubs, murmurs, or gallops. Abdomen: Soft, nontender, nondistended. No organomegaly noted, normoactive bowel sounds. Musculoskeletal: No edema, cyanosis, or clubbing. Neuro: Alert, answering all questions appropriately. Cranial nerves grossly intact. Skin: No rashes or petechiae noted. Psych: Normal affect.  LAB RESULTS:  Lab Results  Component Value Date   NA 134 (L) 04/05/2017   K 4.0 04/05/2017   CL 103 04/05/2017   CO2 19 (L) 04/05/2017   GLUCOSE 200 (H) 04/05/2017   BUN 39 (H) 04/05/2017   CREATININE 1.68 (H) 04/05/2017   CALCIUM 6.7 (L) 04/05/2017   PROT 7.1 04/04/2017   ALBUMIN 3.0 (L) 04/04/2017   AST 20 04/04/2017   ALT 29 04/04/2017   ALKPHOS 121 04/04/2017   BILITOT 0.4 04/04/2017   GFRNONAA 31 (L) 04/05/2017     GFRAA 36 (L) 04/05/2017    Lab Results  Component Value Date   WBC 34.1 (H) 04/06/2017   NEUTROABS 22.1 (H) 04/04/2017   HGB 6.6 (L) 04/06/2017   HCT 20.7 (L) 04/06/2017   MCV 93.2 04/06/2017   PLT 90 (L) 04/06/2017     STUDIES: Dg Chest 2 View  Result Date: 03/22/2017 CLINICAL DATA:  Shortness of breath.  Cough. EXAM: CHEST  2 VIEW COMPARISON:  Radiograph of February 20, 2017. PET scan of February 06, 2017. FINDINGS: Stable cardiomediastinal silhouette. Atherosclerosis of thoracic aorta is noted. No pneumothorax or pleural effusion is noted. Right internal jugular Port-A-Cath is unchanged in position. Interval development of bilateral pulmonary nodules are noted concerning for metastatic disease. Bony thorax is unremarkable. IMPRESSION: Aortic atherosclerosis. Interval development of bilateral pulmonary nodules is noted concerning for metastatic disease. CT scan of the chest is recommended for further evaluation. Electronically Signed   By: Marijo Conception, M.D.   On: 03/22/2017 13:59   Ct Head Wo Contrast  Result Date: 04/04/2017 CLINICAL DATA:  67 year old [name] with altered level of consciousness. Small cell lung cancer. Last chemotherapy 1 week ago. Initial encounter. EXAM: CT HEAD WITHOUT CONTRAST TECHNIQUE: Contiguous axial images were obtained from the base of the skull through the vertex without intravenous contrast. COMPARISON:  02/04/2017 brain MR. FINDINGS: Brain: No intracranial hemorrhage or CT evidence of large acute infarct. Moderate chronic microvascular changes. Atrophy.  No intracranial mass lesion noted on this unenhanced exam. Vascular: Vascular calcifications Skull: No worrisome destructive lesion. Sinuses/Orbits: No acute orbital abnormality. Visualized paranasal sinuses clear. Other: Mastoid air cells and middle ear cavities are clear. IMPRESSION: No acute intracranial abnormality noted. Moderate chronic microvascular changes. Atrophy. No intracranial mass lesion noted  on this unenhanced exam. No intracranial enhancing lesion noted on 02/04/2017 MR. If intracranial metastatic disease were of high clinical concern, follow-up MR may then be considered. Electronically Signed   By: Genia Del M.D.   On: 04/04/2017 12:25   Ct Chest W Contrast  Result Date: 04/04/2017 CLINICAL DATA:  Small cell lung  cancer EXAM: CT CHEST, ABDOMEN, AND PELVIS WITH CONTRAST TECHNIQUE: Multidetector CT imaging of the chest, abdomen and pelvis was performed following the standard protocol during bolus administration of intravenous contrast. CONTRAST:  126mL ISOVUE-300 IOPAMIDOL (ISOVUE-300) INJECTION 61% COMPARISON:  PET-CT dated 02/06/2017. CT chest abdomen pelvis dated 01/19/2017. FINDINGS: CT CHEST FINDINGS Cardiovascular: The heart is top-normal in size. No pericardial effusion. No evidence of thoracic aortic aneurysm. Atherosclerotic calcifications of the aortic arch. Three vessel coronary atherosclerosis. Right chest port terminates at the cavoatrial junction. Mediastinum/Nodes: Thoracic lymphadenopathy, including 10 mm short axis high right paratracheal node (series 2/image 8) and a 10 mm short axis right hilar node (series 2/image 19), decreased. Visualized thyroid is unremarkable. Lungs/Pleura: Left upper lobe mass extending into the left perihilar region (series 2/image 15), difficult to discretely measure but significantly decreased, corresponding to the patient's known primary bronchogenic neoplasm. Bilateral pulmonary nodules, some of which are cavitary, new from recent PET. For example: --17 mm nodule in the posterior right upper lobe (series 4/image 26) --18 mm cavitary nodule in the right lower lobe (series 4/image 52) --22 mm cavitary nodule in the right lower lobe (series 4/image 66) No pleural effusion or pneumothorax. Musculoskeletal: Severe compression fracture deformity at T5 with mild retropulsion. Mild superior endplate compression fracture deformity at T7. Mild superior  endplate changes at N05. These findings are unchanged from the prior CT chest. CT ABDOMEN PELVIS FINDINGS Hepatobiliary: Multifocal hepatic metastases in both lobes, improved from prior CT. Dominant 3.2 by 4.0 cm mass in segment 4A (series 2/image 46), previously 7.1 x 6.5 cm. Distended gallbladder. No intrahepatic ductal dilatation. Dilated common duct, measuring 17 mm, although smoothly tapering at the ampulla. Pancreas: Within normal limits. Spleen: Within normal limits. Adrenals/Urinary Tract: Adrenal glands are within normal limits. Kidneys are within normal limits.  No hydronephrosis. Bladder is within normal limits. Stomach/Bowel: Stomach is notable for a tiny hiatal hernia. No evidence of bowel obstruction. Appendix is not discretely visualized. Mild sigmoid diverticulosis, without evidence of diverticulitis. Vascular/Lymphatic: No evidence of abdominal aortic aneurysm. Atherosclerotic calcifications of the abdominal aorta and branch vessels. No suspicious abdominopelvic lymphadenopathy. Dominant portacaval node measures 6 mm short axis. Reproductive: Uterus is within normal limits. Bilateral ovaries are within normal limits. Other: No abdominopelvic ascites. Musculoskeletal: Mild degenerative changes of the lumbar spine. IMPRESSION: Left upper lobe mass extending into the left perihilar region is significantly decreased from the prior, corresponding to the patient's known primary bronchogenic neoplasm. Mild thoracic nodal metastases, improved. Prior upper abdominal lymphadenopathy has resolved. New bilateral pulmonary nodules, some of which are cavitary, worrisome for metastatic disease. Technically, infection could also have a similar appearance, although this is considered unlikely. Improving hepatic metastases. Known osseous metastases are not well visualized on CT. Electronically Signed   By: Julian Hy M.D.   On: 04/04/2017 17:23   Ct Abdomen Pelvis W Contrast  Result Date:  04/04/2017 CLINICAL DATA:  Small cell lung cancer EXAM: CT CHEST, ABDOMEN, AND PELVIS WITH CONTRAST TECHNIQUE: Multidetector CT imaging of the chest, abdomen and pelvis was performed following the standard protocol during bolus administration of intravenous contrast. CONTRAST:  129mL ISOVUE-300 IOPAMIDOL (ISOVUE-300) INJECTION 61% COMPARISON:  PET-CT dated 02/06/2017. CT chest abdomen pelvis dated 01/19/2017. FINDINGS: CT CHEST FINDINGS Cardiovascular: The heart is top-normal in size. No pericardial effusion. No evidence of thoracic aortic aneurysm. Atherosclerotic calcifications of the aortic arch. Three vessel coronary atherosclerosis. Right chest port terminates at the cavoatrial junction. Mediastinum/Nodes: Thoracic lymphadenopathy, including 10 mm short axis high right paratracheal  node (series 2/image 8) and a 10 mm short axis right hilar node (series 2/image 19), decreased. Visualized thyroid is unremarkable. Lungs/Pleura: Left upper lobe mass extending into the left perihilar region (series 2/image 15), difficult to discretely measure but significantly decreased, corresponding to the patient's known primary bronchogenic neoplasm. Bilateral pulmonary nodules, some of which are cavitary, new from recent PET. For example: --17 mm nodule in the posterior right upper lobe (series 4/image 26) --18 mm cavitary nodule in the right lower lobe (series 4/image 52) --22 mm cavitary nodule in the right lower lobe (series 4/image 66) No pleural effusion or pneumothorax. Musculoskeletal: Severe compression fracture deformity at T5 with mild retropulsion. Mild superior endplate compression fracture deformity at T7. Mild superior endplate changes at D32. These findings are unchanged from the prior CT chest. CT ABDOMEN PELVIS FINDINGS Hepatobiliary: Multifocal hepatic metastases in both lobes, improved from prior CT. Dominant 3.2 by 4.0 cm mass in segment 4A (series 2/image 46), previously 7.1 x 6.5 cm. Distended gallbladder.  No intrahepatic ductal dilatation. Dilated common duct, measuring 17 mm, although smoothly tapering at the ampulla. Pancreas: Within normal limits. Spleen: Within normal limits. Adrenals/Urinary Tract: Adrenal glands are within normal limits. Kidneys are within normal limits.  No hydronephrosis. Bladder is within normal limits. Stomach/Bowel: Stomach is notable for a tiny hiatal hernia. No evidence of bowel obstruction. Appendix is not discretely visualized. Mild sigmoid diverticulosis, without evidence of diverticulitis. Vascular/Lymphatic: No evidence of abdominal aortic aneurysm. Atherosclerotic calcifications of the abdominal aorta and branch vessels. No suspicious abdominopelvic lymphadenopathy. Dominant portacaval node measures 6 mm short axis. Reproductive: Uterus is within normal limits. Bilateral ovaries are within normal limits. Other: No abdominopelvic ascites. Musculoskeletal: Mild degenerative changes of the lumbar spine. IMPRESSION: Left upper lobe mass extending into the left perihilar region is significantly decreased from the prior, corresponding to the patient's known primary bronchogenic neoplasm. Mild thoracic nodal metastases, improved. Prior upper abdominal lymphadenopathy has resolved. New bilateral pulmonary nodules, some of which are cavitary, worrisome for metastatic disease. Technically, infection could also have a similar appearance, although this is considered unlikely. Improving hepatic metastases. Known osseous metastases are not well visualized on CT. Electronically Signed   By: Julian Hy M.D.   On: 04/04/2017 17:23    ASSESSMENT: Stage IV small cell lung carcinoma.  Hyponatremia, acute renal failure, symptomatic anemia.  PLAN:    1.  Stage IV small cell lung carcinoma: CT scan results from April 04, 2017 reviewed independently report as above with overall improvement of disease burden.  Patient noted to have new bilateral pulmonary nodules of unclear etiology but  worrisome for metastatic disease.  After lengthy discussion with the patient, she wishes to continue with palliative chemotherapy and has been instructed to keep her previously scheduled follow-up appointment in the [place] next week.  She has agreed to outpatient palliative care.  She also wishes to be DNR.  Hospice was discussed, but patient does not wish to pursue this at this time wishing to pursue treatment as above. 2.  Acute renal failure: Patient's creatinine is significantly improved with IV fluids.  Monitor. 3.  Hyponatremia: Nearly resolved, monitor. 4.  Anemia: Secondary to chemotherapy.  Patient receiving multiple units of packed red blood cells.  Monitor. 5.  Thrombocytopenia: Secondary to chemotherapy, monitor. 6.  Disposition: Patient has stated she will decline placement in rehabilitation or skilled nursing.  As above, she wishes to go home with outpatient palliative care.  Appreciate consult, call with questions.    Patient  expressed understanding and was in agreement with this plan. She also understands that She can call clinic at any time with any questions, concerns, or complaints.   Cancer Staging Small cell lung cancer [facility]) Staging form: Lung, AJCC 8th Edition - Clinical stage from 01/28/2017: Stage IV (cT4, cN3, pM1c) - Signed by Lloyd Huger, MD on 01/28/2017   Lloyd Huger, MD   04/06/2017 10:21 PM

## 2017-04-06 NOTE — Telephone Encounter (Signed)
Patient daughter calling to let Dr. Rockey Situ know patient has been admitted to [facility] 1 c

## 2017-04-07 LAB — CBC
HEMATOCRIT: 26.3 % — AB (ref 35.0–47.0)
HEMOGLOBIN: 8.5 g/dL — AB (ref 12.0–16.0)
MCH: 29.6 pg (ref 26.0–34.0)
MCHC: 32.5 g/dL (ref 32.0–36.0)
MCV: 91.2 fL (ref 80.0–100.0)
Platelets: 124 10*3/uL — ABNORMAL LOW (ref 150–440)
RBC: 2.88 MIL/uL — ABNORMAL LOW (ref 3.80–5.20)
RDW: 16.9 % — ABNORMAL HIGH (ref 11.5–14.5)
WBC: 34 10*3/uL — AB (ref 3.6–11.0)

## 2017-04-07 LAB — BPAM RBC
BLOOD PRODUCT EXPIRATION DATE: 201902252359
BLOOD PRODUCT EXPIRATION DATE: 201902272359
ISSUE DATE / TIME: 201902201245
ISSUE DATE / TIME: 201902211558
UNIT TYPE AND RH: 600
UNIT TYPE AND RH: 600

## 2017-04-07 LAB — TYPE AND SCREEN
ABO/RH(D): A POS
Antibody Screen: NEGATIVE
UNIT DIVISION: 0
Unit division: 0

## 2017-04-07 LAB — GLUCOSE, CAPILLARY: GLUCOSE-CAPILLARY: 145 mg/dL — AB (ref 65–99)

## 2017-04-07 MED ORDER — LORAZEPAM 0.5 MG PO TABS
0.5000 mg | ORAL_TABLET | Freq: Every day | ORAL | Status: DC | PRN
  Administered 2017-04-07 – 2017-04-09 (×2): 0.5 mg via ORAL
  Filled 2017-04-07 (×2): qty 1

## 2017-04-07 MED ORDER — BENZONATATE 100 MG PO CAPS
100.0000 mg | ORAL_CAPSULE | Freq: Three times a day (TID) | ORAL | Status: DC | PRN
  Administered 2017-04-07 – 2017-04-09 (×3): 100 mg via ORAL
  Filled 2017-04-07 (×3): qty 1

## 2017-04-07 NOTE — Progress Notes (Signed)
[place] [facility] [place] NAME: Grace [name]    MR#:  [id number]  DATE OF BIRTH:  May 08, 1950  Patient still feels weak, short of breath and wants to stay at least one more day to help her with breathing.  Repeat CBC also is pending.  CHIEF COMPLAINT:  No chief complaint on file.   REVIEW OF SYSTEMS:   ROS CONSTITUTIONAL: No fever, some fatigue  eYES: No blurred or double vision.  EARS, NOSE, AND THROAT: No tinnitus or ear pain.  RESPIRATORY: Cough, shortness of breath, phlegm.  CARDIOVASCULAR: No chest pain, orthopnea, edema.  GASTROINTESTINAL: No nausea, vomiting, diarrhea or abdominal pain.  GENITOURINARY: No dysuria, hematuria.  ENDOCRINE: No polyuria, nocturia,  HEMATOLOGY: Anemia.  No easy bruise.  SKIN: No rash or lesion. MUSCULOSKELETAL: No joint pain or arthritis.   NEUROLOGIC: No tingling, numbness, weakness.  PSYCHIATRY: No anxiety or depression.   DRUG ALLERGIES:   Allergies  Allergen Reactions  . Iodine Anaphylaxis    IVP dye - has been premedicated previously without incidence. See 01/29/17 CT notes.  . Fentanyl     Overly sedated and groggy, ineffective     VITALS:  Blood pressure 131/69, pulse (!) 103, temperature 97.8 F (36.6 C), temperature source Oral, resp. rate 17, height 5\' 5"  (1.651 m), weight 97.6 kg (215 lb 3.2 oz), SpO2 95 %.  PHYSICAL EXAMINATION:  GENERAL:  67 y.o.-year-old patient lying in the bed with no acute distress.   EYES: Pupils equal, round, reactive to light . No scleral icterus. Extraocular muscles intact.  HEENT: Head atraumatic, normocephalic. Oropharynx and nasopharynx clear.  NECK:  Supple, no jugular venous distention. No thyroid enlargement, no tenderness.  LUNGS: Patient has faint expiratory wheeze bilaterally. CARDIOVASCULAR: S1, S2 normal. No murmurs, rubs, or gallops.  ABDOMEN: Soft, nontender, nondistended. Bowel sounds present. No organomegaly or mass.  EXTREMITIES: No pedal  edema, cyanosis, or clubbing.  NEUROLOGIC: Cranial nerves II through XII are intact. Muscle strength 5/5 in all extremities. Sensation intact. Gait not checked.  PSYCHIATRIC: The patient is alert and oriented x 3.  SKIN: No obvious rash, lesion, or ulcer.    LABORATORY PANEL:   CBC Recent Labs  Lab 04/06/17 0316  WBC 34.1*  HGB 6.6*  HCT 20.7*  PLT 90*   ------------------------------------------------------------------------------------------------------------------  Chemistries  Recent Labs  Lab 04/04/17 1359  04/05/17 0215  NA 126*   < > 134*  K 4.8   < > 4.0  CL 95*   < > 103  CO2 21*   < > 19*  GLUCOSE 176*   < > 200*  BUN 46*   < > 39*  CREATININE 2.53*   < > 1.68*  CALCIUM 7.1*   < > 6.7*  AST 20  --   --   ALT 29  --   --   ALKPHOS 121  --   --   BILITOT 0.4  --   --    < > = values in this interval not displayed.   ------------------------------------------------------------------------------------------------------------------  Cardiac Enzymes No results for input(s): TROPONINI in the last 168 hours. ------------------------------------------------------------------------------------------------------------------  RADIOLOGY:  No results found.  EKG:   Orders placed or performed in visit on 10/24/16  . EKG 12-Lead    ASSESSMENT AND PLAN:  Mental status secondary to hyponatremia: Improved with hydration CT head is unremarkable for any metastatic disease.  Sodium improved from 126-134.  Metabolic encephalopathy improved.  2.  Acute kidney injury due  to dehydration and poor p.o. intake causing ATN: Improved with IV fluids.  Still has a lot of fatigue.  Increase fluid intake and she is eating well.   3.  Symptomatic anemia: 2 units of packed RBC transfusion so far.  Initial hemoglobin 5.8, improved to 6.6 but patient had fatigue so we give 1 more unit of packed RBC.  Repeat CBC is pending.  Thrombocytopenia secondary to chemotherapy: Platelets stable  around 68, no evidence of bleeding. .  5.  Leukocytosis secondary to lung cancer, CT chest showing multiple metastases bilaterally.  Patient has COPD exacerbation: Started IV steroids, empiric antibiotics for bronchitis.  Patient still thinks she has pneumonia.  We are treating him for the pneumonia and also about CT chest shows more of metastatic disease rather than pneumonia.  Appreciate Dr.  Grayland Ormond following the CT chest and follow-up talking to the patient.  recommend DNR status but patient does not want to do a DNR  6.  Stage IV lung cancer with metastases to liver and bone and CT chest confirming worsening metastases to lungs also. #7 depression: Restart Cymbalta, Abilify from today.  Not started on admission because patient had altered mental status due to hyponatremia on admission. 8.  Hypotension improved, BP still soft so hold metoprolol, Entresto. #9 mouth ulcers: Started Magic mouthwash.  10.  Deconditioning, fatigue: Physical therapy consulted.  11.  Shaking, jitteriness and wanted to split the steroid dosing.  12.  Hyperglycemia due to steroids, start sliding scale insulin with coverage. Discharge in next 1-2 days.  All the records are reviewed and case discussed with Care Management/Social Workerr. Management plans discussed with the patient, family and they are in agreement.  CODE STATUS: Full code  TOTAL TIME TAKING CARE OF THIS PATIENT: 35 minutes.   POSSIBLE D/C IN 1-2DAYS, DEPENDING ON CLINICAL CONDITION.   Epifanio Lesches M.D on 04/07/2017 at 12:47 PM  Between 7am to 6pm - Pager - [phone]  After 6pm go to [email or web address] - password [email or web address] [facility]  Office  [phone]  CC: Primary care physician; [name], MD   Note: This dictation was prepared with Dragon dictation along with smaller phrase technology. Any transcriptional errors that result from this process are unintentional.

## 2017-04-07 NOTE — Evaluation (Signed)
Physical Therapy Evaluation Patient Details Name: Grace [name] MRN: [id number] DOB: [birth year] Today's Date: 04/07/2017   History of Present Illness  Pt is a 67 y/o F who was found to have severe hyponatremia, acute kidney injury.  Pt with stage IV lung cancer with CT chest showing multiple metastases Bil.  CT head is unremarkable for any metastatic disease.  Pt with AMS.  Pt's PMH includes COPD, low back pain, L lumbar laminectomy L4-5, porta cath insertion.      Clinical Impression  Pt admitted with above diagnosis. Pt currently with functional limitations due to the deficits listed below (see PT Problem List). Ms. Bissette is from home where she lives with her husband. Husband with h/o TBI and is able to assist with household chores and meal prep but is unable to provide assist while ambulating.  Daughter at work during the day but does come check on the pt periodically throughout the day.  Pt currently requires close min guard assist for transfers and short distance ambulation and is a fall risk. While ambulating pt fatigues quickly,demonstrating SOB.  SpO2 remains at or above 91% on RA.  Pulse up to 139 while ambulating from 117 at rest.  Pt reports her baseline pulse is ~110 at rest.   Given pt's current mobility status, recommending SNF at d/c. Pt will benefit from skilled PT to increase their independence and safety with mobility to allow discharge to the venue listed below.      Follow Up Recommendations SNF    Equipment Recommendations  None recommended by PT    Recommendations for Other Services       Precautions / Restrictions Precautions Precautions: Fall;Other (comment) Precaution Comments: O2 Restrictions Weight Bearing Restrictions: No      Mobility  Bed Mobility Overal bed mobility: Independent             General bed mobility comments: Pt performs independently without assist or cues  Transfers Overall transfer level: Needs assistance Equipment used:  Rolling walker (2 wheeled) Transfers: Sit to/from Stand Sit to Stand: Min guard         General transfer comment: Close min guard as pt demonstrates mild instability but no LOB.    Ambulation/Gait Ambulation/Gait assistance: Min guard Ambulation Distance (Feet): 20 Feet Assistive device: Rolling walker (2 wheeled) Gait Pattern/deviations: Decreased stride length;Step-through pattern Gait velocity: decreased Gait velocity interpretation: Below normal speed for age/gender General Gait Details: Pt pushing heavily through [place] for support from RW.  Pt fatigues quickly,demonstrating SOB.  SpO2 remains at or above 91% on RA.  Pulse up to 139 while ambulating from 117 at rest.  Pt reports her baseline pulse is ~110 at rest.   Stairs            Wheelchair Mobility    Modified Rankin (Stroke Patients Only)       Balance Overall balance assessment: Needs assistance Sitting-balance support: No upper extremity supported;Feet supported Sitting balance-Leahy Scale: Good     Standing balance support: Bilateral upper extremity supported;During functional activity Standing balance-Leahy Scale: Poor Standing balance comment: Pt relies on UE support for static and dynamic activities                             Pertinent Vitals/Pain Pain Assessment: No/denies pain    Home Living Family/patient expects to be discharged to:: Private residence Living Arrangements: Spouse/significant other Available Help at Discharge: Family;Available PRN/intermittently Type of Home: House Home Access:  Ramped entrance     Home Layout: One level Home Equipment: [place] - 2 wheels;Bedside commode;Shower seat;Wheelchair - manual Additional Comments: Spouse with h/o TBI and is able to assist with household chores and meal prep but is unable to provide assist while ambulating.  Daughter at work during the day but does come check on the pt periodically throughout the day.     Prior Function  Level of Independence: Needs assistance   Gait / Transfers Assistance Needed: Pt ambulates short distances with RW (~up to 20 ft) limited by pulmonary endurance.  She denies any falls in the past 6 months.  Does not use O2 at baseline.    ADL's / Homemaking Assistance Needed: Pt requires assist for lower body sponge bathing, dressing.  Family does the cooking, cleaning.          Hand Dominance        Extremity/Trunk Assessment   Upper Extremity Assessment Upper Extremity Assessment: (BUE strength grossly 4/5)    Lower Extremity Assessment Lower Extremity Assessment: (BLE strength grossly 3+/5)    Cervical / Trunk Assessment Cervical / Trunk Assessment: Other exceptions Cervical / Trunk Exceptions: h/o chronic back pain  Communication   Communication: No difficulties  Cognition Arousal/Alertness: Awake/alert Behavior During Therapy: WFL for tasks assessed/performed Overall Cognitive Status: Within Functional Limits for tasks assessed                                        General Comments      Exercises Other Exercises Other Exercises: Encouraged pt to ambulate in room with nursing staff at least 3x/day.  Other Exercises: Instructed pt in placing chairs in each room in the home so she can take seated rest breaks when she fatigues (if pt returns home at d/c).  Other Exercises: Instructed pt in pursed lip breathing which she demonstrated with activity.    Assessment/Plan    PT Assessment Patient needs continued PT services  PT Problem List Decreased strength;Decreased activity tolerance;Decreased balance;Decreased knowledge of use of DME;Decreased safety awareness;Cardiopulmonary status limiting activity       PT Treatment Interventions DME instruction;Gait training;Functional mobility training;Therapeutic activities;Therapeutic exercise;Balance training;Neuromuscular re-education;Patient/family education;Wheelchair mobility training    PT Goals  (Current goals can be found in the Care Plan section)  Acute Rehab PT Goals Patient Stated Goal: to improve breathing PT Goal Formulation: With patient Time For Goal Achievement: 04/21/17 Potential to Achieve Goals: Fair    Frequency Min 2X/week   Barriers to discharge Decreased caregiver support Limited physical assist at home    Co-evaluation               AM-PAC PT "6 Clicks" Daily Activity  Outcome Measure Difficulty turning over in bed (including adjusting bedclothes, sheets and blankets)?: None Difficulty moving from lying on back to sitting on the side of the bed? : None Difficulty sitting down on and standing up from a chair with arms (e.g., wheelchair, bedside commode, etc,.)?: A Little Help needed moving to and from a bed to chair (including a wheelchair)?: A Little Help needed walking in hospital room?: A Little Help needed climbing 3-5 steps with a railing? : A Lot 6 Click Score: 19    End of Session Equipment Utilized During Treatment: Gait belt Activity Tolerance: Patient limited by fatigue;Other (comment)(SOB) Patient left: in bed;with call bell/phone within reach;with family/visitor present Nurse Communication: Mobility status;Other (comment)(SpO2, pulse) PT Visit  Diagnosis: Muscle weakness (generalized) (M62.81);Unsteadiness on feet (R26.81);Difficulty in walking, not elsewhere classified (R26.2)    Time: 8889-1694 PT Time Calculation (min) (ACUTE ONLY): 17 min   Charges:   PT Evaluation $PT Eval Moderate Complexity: 1 Mod     PT G Codes:        Collie Siad PT, DPT 04/07/2017, 2:43 PM

## 2017-04-07 NOTE — Plan of Care (Signed)
Patient is progressing, sodium level is improving.

## 2017-04-08 LAB — GLUCOSE, CAPILLARY: GLUCOSE-CAPILLARY: 175 mg/dL — AB (ref 65–99)

## 2017-04-08 LAB — BASIC METABOLIC PANEL
ANION GAP: 11 (ref 5–15)
BUN: 18 mg/dL (ref 6–20)
CALCIUM: 7.3 mg/dL — AB (ref 8.9–10.3)
CO2: 23 mmol/L (ref 22–32)
CREATININE: 0.75 mg/dL (ref 0.44–1.00)
Chloride: 103 mmol/L (ref 101–111)
GFR calc Af Amer: >60 mL/min (ref >60)
GLUCOSE: 159 mg/dL — AB (ref 65–99)
Potassium: 3.5 mmol/L (ref 3.5–5.1)
Sodium: 137 mmol/L (ref 135–145)

## 2017-04-08 NOTE — Clinical Social Work Note (Addendum)
CSW spoke with patient regarding going to SNF for short term rehab, patinet expressed she does not want to go to SNF, and would rather go home with home health.  Case discussed with case manager and plan is to discharge home with home health, patient is refusing to go to SNF case manager aware.  CSW to sign off please re-consult if social work needs arise.  Grace [name]. [place], MSW, [place]

## 2017-04-08 NOTE — Progress Notes (Signed)
[place] at [place] NAME: Grace [name]    MR#:  [id number]  DATE OF BIRTH:  1950/12/30  Patient still feels weak, short of breath and wants to stay at least one more day to help her with breathing.  Repeat CBC also is pending.  CHIEF COMPLAINT:  No chief complaint on file. feeling little stronger, states able to ambulate better today  REVIEW OF SYSTEMS:   ROS CONSTITUTIONAL: No fever, some fatigue  eYES: No blurred or double vision.  EARS, NOSE, AND THROAT: No tinnitus or ear pain.  RESPIRATORY: Cough, shortness of breath, phlegm.  CARDIOVASCULAR: No chest pain, orthopnea, edema.  GASTROINTESTINAL: No nausea, vomiting, diarrhea or abdominal pain.  GENITOURINARY: No dysuria, hematuria.  ENDOCRINE: No polyuria, nocturia,  HEMATOLOGY: Anemia.  No easy bruise.  SKIN: No rash or lesion. MUSCULOSKELETAL: No joint pain or arthritis.   NEUROLOGIC: No tingling, numbness, weakness.  PSYCHIATRY: No anxiety or depression.   DRUG ALLERGIES:   Allergies  Allergen Reactions  . Iodine Anaphylaxis    IVP dye - has been premedicated previously without incidence. See 01/29/17 CT notes.  . Fentanyl     Overly sedated and groggy, ineffective     VITALS:  Blood pressure (!) 142/88, pulse 95, temperature 98 F (36.7 C), temperature source Oral, resp. rate 20, height 5\' 5"  (1.651 m), weight 215 lb 3.2 oz (97.6 kg), SpO2 97 %.  PHYSICAL EXAMINATION:  GENERAL:  67 y.o.-year-old patient lying in the bed with no acute distress.   EYES: Pupils equal, round, reactive to light . No scleral icterus. Extraocular muscles intact.  HEENT: Head atraumatic, normocephalic. Oropharynx and nasopharynx clear.  NECK:  Supple, no jugular venous distention. No thyroid enlargement, no tenderness.  LUNGS: Patient has faint expiratory wheeze bilaterally. CARDIOVASCULAR: S1, S2 normal. No murmurs, rubs, or gallops.  ABDOMEN: Soft, nontender, nondistended. Bowel sounds  present. No organomegaly or mass.  EXTREMITIES: No pedal edema, cyanosis, or clubbing.  NEUROLOGIC: Cranial nerves II through XII are intact. Muscle strength 5/5 in all extremities. Sensation intact. Gait not checked.  PSYCHIATRIC: The patient is alert and oriented x 3.  SKIN: No obvious rash, lesion, or ulcer.    LABORATORY PANEL:   CBC Recent Labs  Lab 04/07/17 1124  WBC 34.0*  HGB 8.5*  HCT 26.3*  PLT 124*   ------------------------------------------------------------------------------------------------------------------  Chemistries  Recent Labs  Lab 04/04/17 1359  04/08/17 1307  NA 126*   < > 137  K 4.8   < > 3.5  CL 95*   < > 103  CO2 21*   < > 23  GLUCOSE 176*   < > 159*  BUN 46*   < > 18  CREATININE 2.53*   < > 0.75  CALCIUM 7.1*   < > 7.3*  AST 20  --   --   ALT 29  --   --   ALKPHOS 121  --   --   BILITOT 0.4  --   --    < > = values in this interval not displayed.   ------------------------------------------------------------------------------------------------------------------  Cardiac Enzymes No results for input(s): TROPONINI in the last 168 hours. ------------------------------------------------------------------------------------------------------------------  RADIOLOGY:  No results found.  EKG:   Orders placed or performed in visit on 10/24/16  . EKG 12-Lead    ASSESSMENT AND PLAN:  1. Acute encephalopathy due to hyponatremia: Improved with hydration CT head is unremarkable for any metastatic disease.  Sodium improved from 126-134.  Metabolic  encephalopathy improved.  2.  Acute kidney injury due to dehydration and poor p.o. intake causing ATN: Improved with IV fluids.   Renal function now normal  3.  Symptomatic anemia: Status post transfusion hemoglobin stable  4.  Acute on chronic COPD exasperation Improved with nebulizer therapy and steroids and oral antibiotics  5.  Leukocytosis multifactorial  6.  Stage IV lung cancer with  metastases to liver and bone and CT chest confirming worsening metastases to lungs also.  Needs outpatient oncology follow-up 7.  depression: Continue Cymbalta, Abilify 8.  Hypotension improved, resume medications on discharge. 9 mouth ulcers: Continue Magic mouthwash  10.  Deconditioning, fatigue: Physical therapy consulted.  11.  Hyperglycemia due to steroids, start sliding scale insulin with coverage.   All the records are reviewed and case discussed with Care Management/Social Workerr. Management plans discussed with the patient, family and they are in agreement.  CODE STATUS: Full code  TOTAL TIME TAKING CARE OF THIS PATIENT: 32 minutes.   POSSIBLE D/C IN 1-2DAYS, DEPENDING ON CLINICAL CONDITION.   Dustin Flock M.D on 04/08/2017 at 2:31 PM  Between 7am to 6pm - Pager - [phone]  After 6pm go to [email or web address] - password [email or web address] [facility]  Office  [phone]  CC: Primary care physician; [name], MD   Note: This dictation was prepared with Dragon dictation along with smaller phrase technology. Any transcriptional errors that result from this process are unintentional.

## 2017-04-09 MED ORDER — OXYCODONE-ACETAMINOPHEN 5-325 MG PO TABS: 1.0000 | ORAL_TABLET | Freq: Four times a day (QID) | ORAL | 0 refills | Status: DC | PRN

## 2017-04-09 MED ORDER — HEPARIN SOD (PORK) LOCK FLUSH 100 UNIT/ML IV SOLN
500.0000 [IU] | Freq: Once | INTRAVENOUS | Status: AC
  Administered 2017-04-09: 13:00:00 500 [IU] via INTRAVENOUS
  Filled 2017-04-09: qty 5

## 2017-04-09 MED ORDER — PREDNISONE 10 MG (21) PO TBPK: ORAL_TABLET | ORAL | 0 refills | Status: DC

## 2017-04-09 MED ORDER — AZITHROMYCIN 500 MG PO TABS: 500.0000 mg | ORAL_TABLET | Freq: Every day | ORAL | 0 refills | Status: DC

## 2017-04-09 MED ORDER — BENZONATATE 100 MG PO CAPS: 100.0000 mg | ORAL_CAPSULE | Freq: Three times a day (TID) | ORAL | 0 refills | Status: DC | PRN

## 2017-04-09 NOTE — Discharge Summary (Signed)
[place] at [facility], 67 y.o., DOB September 17, 1950, MRN [id number]. Admission date: 04/04/2017 Discharge Date 04/09/2017 Primary MD [name], MD Admitting Physician Epifanio Lesches, MD  Admission Diagnosis  AMS hyponatremia anemia Acute kidney injury     Discharge Diagnosis   Active Problems:  1.  Acute encephalopathy due to hyponatremia  2.  Acute kidney injury  3.  Symptomatic anemia 4.  Acute on chronic COPD exasperation 5.  Leukocytosis multifactorial 6.  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  Depression 8 hypotension.  9.  Generalized deconditioning 10.  Oral ulcers 11.  Hyperglycemia due to steroids     Hospital Course  Grace [name]  is a 67 y.o. female with a known history of lung cancer went to Dr. Gary Fleet office and found to have severe hyponatremia, acute kidney injury.  Brought by family because of worsening mental status.  According to family symptoms started a week ago and gradually worsened.  Patient is weak, confused, repetitive speech, poor p.o. intake Patient was admitted and given IV fluids With resolution of hyponatremia.  Patient's renal failure also resolved.  She also had acute on chronic COPD exasperation which was treated.  She also had symptomatic anemia related to chemotherapy and received transfusion.  Patient's mental status is back to baseline.  She will follow-up with her primary oncologist.  At that time a CBC and a BMP needs to be checked. Patient will have home health arrange           Consults  onclology  Significant Tests:  See full reports for all details     Dg Chest 2 View  Result Date: 03/22/2017 CLINICAL DATA:  Shortness of breath.  Cough. EXAM: CHEST  2 VIEW COMPARISON:  Radiograph of February 20, 2017. PET scan of February 06, 2017. FINDINGS: Stable cardiomediastinal silhouette. Atherosclerosis of thoracic aorta is noted. No pneumothorax or pleural effusion  is noted. Right internal jugular Port-A-Cath is unchanged in position. Interval development of bilateral pulmonary nodules are noted concerning for metastatic disease. Bony thorax is unremarkable. IMPRESSION: Aortic atherosclerosis. Interval development of bilateral pulmonary nodules is noted concerning for metastatic disease. CT scan of the chest is recommended for further evaluation. Electronically Signed   By: Marijo Conception, M.D.   On: 03/22/2017 13:59   Ct Head Wo Contrast  Result Date: 04/04/2017 CLINICAL DATA:  67 year old female with altered level of consciousness. Small cell lung cancer. Last chemotherapy 1 week ago. Initial encounter. EXAM: CT HEAD WITHOUT CONTRAST TECHNIQUE: Contiguous axial images were obtained from the base of the skull through the vertex without intravenous contrast. COMPARISON:  02/04/2017 brain MR. FINDINGS: Brain: No intracranial hemorrhage or CT evidence of large acute infarct. Moderate chronic microvascular changes. Atrophy.  No intracranial mass lesion noted on this unenhanced exam. Vascular: Vascular calcifications Skull: No worrisome destructive lesion. Sinuses/Orbits: No acute orbital abnormality. Visualized paranasal sinuses clear. Other: Mastoid air cells and middle ear cavities are clear. IMPRESSION: No acute intracranial abnormality noted. Moderate chronic microvascular changes. Atrophy. No intracranial mass lesion noted on this unenhanced exam. No intracranial enhancing lesion noted on 02/04/2017 MR. If intracranial metastatic disease were of high clinical concern, follow-up MR may then be considered. Electronically Signed   By: Genia Del M.D.   On: 04/04/2017 12:25   Ct Chest W Contrast  Result Date: 04/04/2017 CLINICAL DATA:  Small cell lung cancer EXAM: CT CHEST, ABDOMEN, AND PELVIS WITH CONTRAST TECHNIQUE: Multidetector  CT imaging of the chest, abdomen and pelvis was performed following the standard protocol during bolus administration of intravenous  contrast. CONTRAST:  118mL ISOVUE-300 IOPAMIDOL (ISOVUE-300) INJECTION 61% COMPARISON:  PET-CT dated 02/06/2017. CT chest abdomen pelvis dated 01/19/2017. FINDINGS: CT CHEST FINDINGS Cardiovascular: The heart is top-normal in size. No pericardial effusion. No evidence of thoracic aortic aneurysm. Atherosclerotic calcifications of the aortic arch. Three vessel coronary atherosclerosis. Right chest port terminates at the cavoatrial junction. Mediastinum/Nodes: Thoracic lymphadenopathy, including 10 mm short axis high right paratracheal node (series 2/image 8) and a 10 mm short axis right hilar node (series 2/image 19), decreased. Visualized thyroid is unremarkable. Lungs/Pleura: Left upper lobe mass extending into the left perihilar region (series 2/image 15), difficult to discretely measure but significantly decreased, corresponding to the patient's known primary bronchogenic neoplasm. Bilateral pulmonary nodules, some of which are cavitary, new from recent PET. For example: --17 mm nodule in the posterior right upper lobe (series 4/image 26) --18 mm cavitary nodule in the right lower lobe (series 4/image 52) --22 mm cavitary nodule in the right lower lobe (series 4/image 66) No pleural effusion or pneumothorax. Musculoskeletal: Severe compression fracture deformity at T5 with mild retropulsion. Mild superior endplate compression fracture deformity at T7. Mild superior endplate changes at B76. These findings are unchanged from the prior CT chest. CT ABDOMEN PELVIS FINDINGS Hepatobiliary: Multifocal hepatic metastases in both lobes, improved from prior CT. Dominant 3.2 by 4.0 cm mass in segment 4A (series 2/image 46), previously 7.1 x 6.5 cm. Distended gallbladder. No intrahepatic ductal dilatation. Dilated common duct, measuring 17 mm, although smoothly tapering at the ampulla. Pancreas: Within normal limits. Spleen: Within normal limits. Adrenals/Urinary Tract: Adrenal glands are within normal limits. Kidneys are  within normal limits.  No hydronephrosis. Bladder is within normal limits. Stomach/Bowel: Stomach is notable for a tiny hiatal hernia. No evidence of bowel obstruction. Appendix is not discretely visualized. Mild sigmoid diverticulosis, without evidence of diverticulitis. Vascular/Lymphatic: No evidence of abdominal aortic aneurysm. Atherosclerotic calcifications of the abdominal aorta and branch vessels. No suspicious abdominopelvic lymphadenopathy. Dominant portacaval node measures 6 mm short axis. Reproductive: Uterus is within normal limits. Bilateral ovaries are within normal limits. Other: No abdominopelvic ascites. Musculoskeletal: Mild degenerative changes of the lumbar spine. IMPRESSION: Left upper lobe mass extending into the left perihilar region is significantly decreased from the prior, corresponding to the patient's known primary bronchogenic neoplasm. Mild thoracic nodal metastases, improved. Prior upper abdominal lymphadenopathy has resolved. New bilateral pulmonary nodules, some of which are cavitary, worrisome for metastatic disease. Technically, infection could also have a similar appearance, although this is considered unlikely. Improving hepatic metastases. Known osseous metastases are not well visualized on CT. Electronically Signed   By: Julian Hy M.D.   On: 04/04/2017 17:23   Ct Abdomen Pelvis W Contrast  Result Date: 04/04/2017 CLINICAL DATA:  Small cell lung cancer EXAM: CT CHEST, ABDOMEN, AND PELVIS WITH CONTRAST TECHNIQUE: Multidetector CT imaging of the chest, abdomen and pelvis was performed following the standard protocol during bolus administration of intravenous contrast. CONTRAST:  149mL ISOVUE-300 IOPAMIDOL (ISOVUE-300) INJECTION 61% COMPARISON:  PET-CT dated 02/06/2017. CT chest abdomen pelvis dated 01/19/2017. FINDINGS: CT CHEST FINDINGS Cardiovascular: The heart is top-normal in size. No pericardial effusion. No evidence of thoracic aortic aneurysm. Atherosclerotic  calcifications of the aortic arch. Three vessel coronary atherosclerosis. Right chest port terminates at the cavoatrial junction. Mediastinum/Nodes: Thoracic lymphadenopathy, including 10 mm short axis high right paratracheal node (series 2/image 8) and a 10 mm short axis  right hilar node (series 2/image 19), decreased. Visualized thyroid is unremarkable. Lungs/Pleura: Left upper lobe mass extending into the left perihilar region (series 2/image 15), difficult to discretely measure but significantly decreased, corresponding to the patient's known primary bronchogenic neoplasm. Bilateral pulmonary nodules, some of which are cavitary, new from recent PET. For example: --17 mm nodule in the posterior right upper lobe (series 4/image 26) --18 mm cavitary nodule in the right lower lobe (series 4/image 52) --22 mm cavitary nodule in the right lower lobe (series 4/image 66) No pleural effusion or pneumothorax. Musculoskeletal: Severe compression fracture deformity at T5 with mild retropulsion. Mild superior endplate compression fracture deformity at T7. Mild superior endplate changes at W29. These findings are unchanged from the prior CT chest. CT ABDOMEN PELVIS FINDINGS Hepatobiliary: Multifocal hepatic metastases in both lobes, improved from prior CT. Dominant 3.2 by 4.0 cm mass in segment 4A (series 2/image 46), previously 7.1 x 6.5 cm. Distended gallbladder. No intrahepatic ductal dilatation. Dilated common duct, measuring 17 mm, although smoothly tapering at the ampulla. Pancreas: Within normal limits. Spleen: Within normal limits. Adrenals/Urinary Tract: Adrenal glands are within normal limits. Kidneys are within normal limits.  No hydronephrosis. Bladder is within normal limits. Stomach/Bowel: Stomach is notable for a tiny hiatal hernia. No evidence of bowel obstruction. Appendix is not discretely visualized. Mild sigmoid diverticulosis, without evidence of diverticulitis. Vascular/Lymphatic: No evidence of  abdominal aortic aneurysm. Atherosclerotic calcifications of the abdominal aorta and branch vessels. No suspicious abdominopelvic lymphadenopathy. Dominant portacaval node measures 6 mm short axis. Reproductive: Uterus is within normal limits. Bilateral ovaries are within normal limits. Other: No abdominopelvic ascites. Musculoskeletal: Mild degenerative changes of the lumbar spine. IMPRESSION: Left upper lobe mass extending into the left perihilar region is significantly decreased from the prior, corresponding to the patient's known primary bronchogenic neoplasm. Mild thoracic nodal metastases, improved. Prior upper abdominal lymphadenopathy has resolved. New bilateral pulmonary nodules, some of which are cavitary, worrisome for metastatic disease. Technically, infection could also have a similar appearance, although this is considered unlikely. Improving hepatic metastases. Known osseous metastases are not well visualized on CT. Electronically Signed   By: Julian Hy M.D.   On: 04/04/2017 17:23       Today   Subjective:   Grace [name] patient feeling much better ready to go home Objective:   Blood pressure (!) 146/92, pulse (!) 107, temperature 98.4 F (36.9 C), temperature source Oral, resp. rate 18, height 5\' 5"  (1.651 m), weight 214 lb 4.6 oz (97.2 kg), SpO2 97 %.  .  Intake/Output Summary (Last 24 hours) at 04/09/2017 1459 Last data filed at 04/09/2017 1054 Gross per 24 hour  Intake 780 ml  Output -  Net 780 ml    Exam VITAL SIGNS: Blood pressure (!) 146/92, pulse (!) 107, temperature 98.4 F (36.9 C), temperature source Oral, resp. rate 18, height 5\' 5"  (1.651 m), weight 214 lb 4.6 oz (97.2 kg), SpO2 97 %.  GENERAL:  67 y.o.-year-old patient lying in the bed with no acute distress.  EYES: Pupils equal, round, reactive to light and accommodation. No scleral icterus. Extraocular muscles intact.  HEENT: Head atraumatic, normocephalic. Oropharynx and nasopharynx clear.  NECK:   Supple, no jugular venous distention. No thyroid enlargement, no tenderness.  LUNGS: Normal breath sounds bilaterally, no wheezing, rales,rhonchi or crepitation. No use of accessory muscles of respiration.  CARDIOVASCULAR: S1, S2 normal. No murmurs, rubs, or gallops.  ABDOMEN: Soft, nontender, nondistended. Bowel sounds present. No organomegaly or mass.  EXTREMITIES: No pedal  edema, cyanosis, or clubbing.  NEUROLOGIC: Cranial nerves II through XII are intact. Muscle strength 5/5 in all extremities. Sensation intact. Gait not checked.  PSYCHIATRIC: The patient is alert and oriented x 3.  SKIN: No obvious rash, lesion, or ulcer.   Data Review     CBC w Diff:  Lab Results  Component Value Date   WBC 34.0 (H) 04/07/2017   HGB 8.5 (L) 04/07/2017   HGB 15.3 02/21/2014   HCT 26.3 (L) 04/07/2017   HCT 45.5 02/21/2014   PLT 124 (L) 04/07/2017   PLT 247 02/21/2014   LYMPHOPCT 6 04/04/2017   LYMPHOPCT 24.5 02/21/2014   BANDSPCT 0 02/20/2017   MONOPCT 0 04/04/2017   MONOPCT 6.5 02/21/2014   EOSPCT 0 04/04/2017   EOSPCT 0.6 02/21/2014   BASOPCT 0 04/04/2017   BASOPCT 0.6 02/21/2014   CMP:  Lab Results  Component Value Date   NA 137 04/08/2017   NA 140 10/11/2016   NA 138 02/21/2014   K 3.5 04/08/2017   K 3.7 02/21/2014   CL 103 04/08/2017   CL 106 02/21/2014   CO2 23 04/08/2017   CO2 21 02/21/2014   BUN 18 04/08/2017   BUN 10 10/11/2016   BUN 11 02/21/2014   CREATININE 0.75 04/08/2017   CREATININE 0.89 02/21/2014   PROT 7.1 04/04/2017   PROT 7.8 02/21/2014   ALBUMIN 3.0 (L) 04/04/2017   ALBUMIN 4.0 02/21/2014   BILITOT 0.4 04/04/2017   BILITOT 0.4 02/21/2014   ALKPHOS 121 04/04/2017   ALKPHOS 110 02/21/2014   AST 20 04/04/2017   AST 29 02/21/2014   ALT 29 04/04/2017   ALT 38 02/21/2014  .  Micro Results No results found for this or any previous visit (from the past 240 hour(s)).      Code Status Orders  (From admission, onward)        Start     Ordered    04/04/17 1558  Full code  Continuous     04/04/17 1558    Code Status History    Date Active Date Inactive Code Status Order ID Comments User Context   08/01/2016 10:57 08/01/2016 22:50 Full Code [id number]  Earnie Larsson, MD Inpatient   08/22/2014 16:28 08/24/2014 14:58 Full Code [id number]  Epifanio Lesches, MD ED   07/15/2014 15:40 07/17/2014 19:21 Full Code [id number]  Earnie Larsson, MD Inpatient   06/30/2014 05:20 07/02/2014 18:55 Full Code [id number]  Lance Coon, MD ED          Follow-up Information    [name], MD. Schedule an appointment as soon as possible for a visit in 1 week.   Specialty:  Family Medicine Why:  Office Closed  Contact information: [place] [address] [phone]        Lloyd Huger, MD. Go on 04/12/2017.   Specialty:  Oncology Why:  @8 :45 AM Contact information: [place] [address] [phone]           Discharge Medications   Allergies as of 04/09/2017      Reactions   Iodine Anaphylaxis   IVP dye - has been premedicated previously without incidence. See 01/29/17 CT notes.   Fentanyl    Overly sedated and groggy, ineffective       Medication List    TAKE these medications   alendronate 70 MG tablet Commonly known as:  FOSAMAX Take 70 mg by mouth every Thursday.   ARIPiprazole 5 MG tablet Commonly known as:  ABILIFY Take 5 mg by mouth daily.   azithromycin 500 MG tablet Commonly known as:  ZITHROMAX Take 1 tablet (500 mg total) by mouth daily.   benzonatate 100 MG capsule Commonly known as:  TESSALON Take 1 capsule (100 mg total) by mouth every 8 (eight) hours as needed for cough.   CALCIUM + D PO Take 1 tablet by mouth 2 (two) times daily.   DULoxetine 60 MG capsule Commonly known as:  CYMBALTA Take 60 mg by mouth daily.   ipratropium-albuterol 0.5-2.5 (3) MG/3ML Soln Commonly known as:  DUONEB Take 3 mLs by nebulization every 4 (four) hours as needed.    lidocaine-prilocaine cream Commonly known as:  EMLA Apply to affected area once   metoprolol succinate 100 MG 24 hr tablet Commonly known as:  TOPROL-XL TAKE ONE TABLET BY MOUTH ONCE DAILY   oxyCODONE-acetaminophen 5-325 MG tablet Commonly known as:  PERCOCET/ROXICET Take 1-2 tablets by mouth every 6 (six) hours as needed. For pain.   potassium chloride SA 20 MEQ tablet Commonly known as:  K-DUR,KLOR-CON Take 1 tablet (20 mEq total) by mouth 2 (two) times daily.   predniSONE 10 MG (21) Tbpk tablet Commonly known as:  STERAPRED UNI-PAK 21 TAB Start at 60mg  taper by 10mg  until complete   PROAIR HFA 108 (90 Base) MCG/ACT inhaler Generic drug:  albuterol Inhale 2 puffs into the lungs every 6 (six) hours as needed for wheezing or shortness of breath.   prochlorperazine 10 MG tablet Commonly known as:  COMPAZINE Take 1 tablet (10 mg total) by mouth every 6 (six) hours as needed (Nausea or vomiting).   sacubitril-valsartan 49-51 MG Commonly known as:  ENTRESTO Take 1 tablet by mouth 2 (two) times daily. What changed:  when to take this          Total Time in preparing paper work, data evaluation and todays exam - 31 minutes  Dustin Flock M.D on 04/09/2017 at 2:59 PM  [facility]   Office  [phone]

## 2017-04-09 NOTE — Progress Notes (Signed)
Pt being discharged home with home health, discharge instructions and prescriptions reviewed with pt, states understanding pt with no complaints, EMS to transport pt

## 2017-04-09 NOTE — Care Management Note (Addendum)
Case Management Note  Patient Details  Name: Grace [name] MRN: [id number] Date of Birth: 1951/01/03  Subjective/Objective:  Discussed discharge planning with [name]. She chose [facility] from a list of local home health providers. A referral for HH=PT, Aide was called to Melene Muller at [facility]. Paperwork for EMS transportation has been completed and is in [name]'s chart.                   Action/Plan:   Expected Discharge Date:  04/09/17               Expected Discharge Plan:  [place]  In-House Referral:     Discharge planning Services  CM Consult  Post Acute Care Choice:    Choice offered to:  Patient  DME Arranged:    DME Agency:     HH Arranged:  PT, Nurse's Aide [place] Agency:  [place]  Status of Service:  Completed, signed off  If discussed at [place] of Stay Meetings, dates discussed:    Additional Comments:  Kaylee Wombles A, RN 04/09/2017, 1:16 PM

## 2017-04-09 NOTE — Progress Notes (Signed)
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  INTERVAL HISTORY: Patient returns to clinic today for hospital follow-up and consideration of cycle 4, day 1 of carboplatinum, etoposide, and Tecentriq.  She continues to have increased weakness and fatigue, but this has improved since discharge.  She has chronic pain and is seen in the pain clinic on a regular basis, but otherwise feels well.  She has no neurologic complaints.  She denies any recent fevers or illnesses. She has a good appetite and denies weight loss.  She has chronic shortness of breath, but denies any chest pain or hemoptysis.  She denies any nausea, vomiting, constipation, or diarrhea.  She has no abdominal pain.  She denies any melena or hematochezia.  She has no urinary complaints.  Patient offers no further specific complaints today.  REVIEW OF SYSTEMS:   Review of Systems  Constitutional: Positive for weight loss. Negative for fever and malaise/fatigue.  Respiratory: Positive for cough, shortness of breath and wheezing. Negative for hemoptysis.   Cardiovascular: Negative.  Negative for chest pain and leg swelling.  Gastrointestinal: Negative.  Negative for abdominal pain, blood in stool and melena.  Genitourinary: Negative.   Musculoskeletal: Positive for back pain, joint pain and neck pain.  Skin: Negative.  Negative for rash.  Neurological: Positive for weakness. Negative for sensory change.  Psychiatric/Behavioral: Negative.  The patient is not nervous/anxious.     As per HPI. Otherwise, a complete review of systems is negative.  PAST MEDICAL HISTORY: Past Medical History:  Diagnosis Date  . Anxiety   . Arthritis   .  Asthma    COPD  . Cancer ([place])    Liver  . CAP (community acquired pneumonia) 06/30/2014  . Cardiomyopathy ([place])    nonischemic (EF 35-40%)  . CHF (congestive heart failure) ([place])   . COPD (chronic obstructive pulmonary disease) ([place])   . Depression   . Dyspnea   . GERD (gastroesophageal reflux disease)   . Hyperlipidemia   . Hypertension   . Pneumonia   . PVC's (premature ventricular contractions)    states 10 years ago  . Sepsis ([place]) 06/30/2014  . SOB (shortness of breath) 05/08/2014  . Spinal cord injury at T7-T12 level [facility])    2016    PAST SURGICAL HISTORY: Past Surgical History:  Procedure Laterality Date  . APPENDECTOMY    . CARDIAC CATHETERIZATION    . CATARACT EXTRACTION     right eye   . CESAREAN SECTION    . COLONOSCOPY    . LAMINOTOMY    . LUMBAR LAMINECTOMY/DECOMPRESSION MICRODISCECTOMY Left 08/01/2016   Procedure: Left Lumbar Four-Five Laminectomy and Foraminotomy;  Surgeon: Earnie Larsson, MD;  Location: [facility];  Service: Neurosurgery;  Laterality: Left;  . PORTA CATH INSERTION N/A 02/03/2017   Procedure: PORTA CATH INSERTION;  Surgeon: Katha Cabal, MD;  Location: [facility];  Service: Cardiovascular;  Laterality: N/A;    FAMILY HISTORY: Family History  Problem Relation Age of Onset  . Heart attack Father 76       MI x 3     ADVANCED DIRECTIVES (Y/N):  N  HEALTH MAINTENANCE: Social History   Tobacco Use  . Smoking status: Former Smoker  Packs/day: 0.50    Years: 50.00    Pack years: 25.00    Types: Cigarettes    Last attempt to quit: 01/27/2015    Years since quitting: 2.2  . Smokeless tobacco: Never Used  Substance Use Topics  . Alcohol use: No  . Drug use: No     Colonoscopy:  PAP:  Bone density:  Lipid panel:  Allergies  Allergen Reactions  . Iodine Anaphylaxis    IVP dye - has been premedicated previously without incidence. See 01/29/17 CT notes.  . Fentanyl     Overly sedated and groggy, ineffective      Current Outpatient Medications  Medication Sig Dispense Refill  . ADVAIR DISKUS 250-50 MCG/DOSE AEPB Inhale 1 puff into the lungs 2 (two) times daily.    Marland Kitchen albuterol (PROAIR HFA) 108 (90 Base) MCG/ACT inhaler Inhale 2 puffs into the lungs every 6 (six) hours as needed for wheezing or shortness of breath.     Marland Kitchen alendronate (FOSAMAX) 70 MG tablet Take 70 mg by mouth every Thursday.     . ARIPiprazole (ABILIFY) 5 MG tablet Take 5 mg by mouth daily.    . benzonatate (TESSALON) 100 MG capsule Take 1 capsule (100 mg total) by mouth every 8 (eight) hours as needed for cough. 20 capsule 0  . Calcium Citrate-Vitamin D (CALCIUM + D PO) Take 1 tablet by mouth 2 (two) times daily.    . DULoxetine (CYMBALTA) 60 MG capsule Take 60 mg by mouth daily.    Marland Kitchen ipratropium-albuterol (DUONEB) 0.5-2.5 (3) MG/3ML SOLN Take 3 mLs by nebulization every 4 (four) hours as needed. 360 mL 1  . lidocaine-prilocaine (EMLA) cream Apply to affected area once 30 g 3  . metoprolol succinate (TOPROL-XL) 100 MG 24 hr tablet TAKE ONE TABLET BY MOUTH ONCE DAILY 30 tablet 0  . oxyCODONE-acetaminophen (PERCOCET/ROXICET) 5-325 MG tablet Take 1-2 tablets by mouth every 6 (six) hours as needed. For pain. 120 tablet 0  . potassium chloride SA (K-DUR,KLOR-CON) 20 MEQ tablet Take 1 tablet (20 mEq total) by mouth 2 (two) times daily. 60 tablet 1  . predniSONE (STERAPRED UNI-PAK 21 TAB) 10 MG (21) TBPK tablet Start at 60mg  taper by 10mg  until complete 21 tablet 0  . prochlorperazine (COMPAZINE) 10 MG tablet Take 1 tablet (10 mg total) by mouth every 6 (six) hours as needed (Nausea or vomiting). 60 tablet 2  . sacubitril-valsartan (ENTRESTO) 49-51 MG Take 1 tablet by mouth 2 (two) times daily. (Patient taking differently: Take 1 tablet by mouth daily. ) 60 tablet 6   No current facility-administered medications for this visit.    Facility-Administered Medications Ordered in Other Visits  Medication Dose Route Frequency Provider Last Rate  Last Dose  . sodium chloride flush (NS) 0.9 % injection 10 mL  10 mL Intravenous PRN Lloyd Huger, MD   10 mL at 03/01/17 0841    OBJECTIVE: Vitals:   04/12/17 0905 04/12/17 0911  BP:  (!) 142/91  Pulse:  (!) 106  Resp: 12   Temp:  (!) 97.5 F (36.4 C)     Body mass index is 35.66 kg/m.    ECOG FS:2 - Symptomatic, <50% confined to bed  General: Ill-appearing, no acute distress.  Sitting in a wheelchair. Eyes: Pink conjunctiva, anicteric sclera. Lungs: Diminished breath sounds bilaterally, scattered wheezing. Heart: Regular rate and rhythm. No rubs, murmurs, or gallops. Abdomen: Soft, nontender, nondistended. No organomegaly noted, normoactive bowel sounds. Musculoskeletal: No edema, cyanosis, or clubbing. Neuro: Alert,  answering all questions appropriately. Cranial nerves grossly intact. Skin: No rashes or petechiae noted. Psych: Normal affect.   LAB RESULTS:  Lab Results  Component Value Date   NA 136 04/12/2017   K 3.4 (L) 04/12/2017   CL 104 04/12/2017   CO2 22 04/12/2017   GLUCOSE 141 (H) 04/12/2017   BUN 24 (H) 04/12/2017   CREATININE 0.72 04/12/2017   CALCIUM 8.9 04/12/2017   PROT 7.2 04/12/2017   ALBUMIN 3.5 04/12/2017   AST 24 04/12/2017   ALT 30 04/12/2017   ALKPHOS 73 04/12/2017   BILITOT 0.4 04/12/2017   GFRNONAA >60 04/12/2017   GFRAA >60 04/12/2017    Lab Results  Component Value Date   WBC 22.7 (H) 04/12/2017   NEUTROABS 18.4 (H) 04/12/2017   HGB 10.5 (L) 04/12/2017   HCT 31.5 (L) 04/12/2017   MCV 96.2 04/12/2017   PLT 331 04/12/2017     STUDIES: Dg Chest 2 View  Result Date: 03/22/2017 CLINICAL DATA:  Shortness of breath.  Cough. EXAM: CHEST  2 VIEW COMPARISON:  Radiograph of February 20, 2017. PET scan of February 06, 2017. FINDINGS: Stable cardiomediastinal silhouette. Atherosclerosis of thoracic aorta is noted. No pneumothorax or pleural effusion is noted. Right internal jugular Port-A-Cath is unchanged in position. Interval  development of bilateral pulmonary nodules are noted concerning for metastatic disease. Bony thorax is unremarkable. IMPRESSION: Aortic atherosclerosis. Interval development of bilateral pulmonary nodules is noted concerning for metastatic disease. CT scan of the chest is recommended for further evaluation. Electronically Signed   By: Marijo Conception, M.D.   On: 03/22/2017 13:59   Ct Head Wo Contrast  Result Date: 04/04/2017 CLINICAL DATA:  67 year old female with altered level of consciousness. Small cell lung cancer. Last chemotherapy 1 week ago. Initial encounter. EXAM: CT HEAD WITHOUT CONTRAST TECHNIQUE: Contiguous axial images were obtained from the base of the skull through the vertex without intravenous contrast. COMPARISON:  02/04/2017 brain MR. FINDINGS: Brain: No intracranial hemorrhage or CT evidence of large acute infarct. Moderate chronic microvascular changes. Atrophy.  No intracranial mass lesion noted on this unenhanced exam. Vascular: Vascular calcifications Skull: No worrisome destructive lesion. Sinuses/Orbits: No acute orbital abnormality. Visualized paranasal sinuses clear. Other: Mastoid air cells and middle ear cavities are clear. IMPRESSION: No acute intracranial abnormality noted. Moderate chronic microvascular changes. Atrophy. No intracranial mass lesion noted on this unenhanced exam. No intracranial enhancing lesion noted on 02/04/2017 MR. If intracranial metastatic disease were of high clinical concern, follow-up MR may then be considered. Electronically Signed   By: Genia Del M.D.   On: 04/04/2017 12:25   Ct Chest W Contrast  Result Date: 04/04/2017 CLINICAL DATA:  Small cell lung cancer EXAM: CT CHEST, ABDOMEN, AND PELVIS WITH CONTRAST TECHNIQUE: Multidetector CT imaging of the chest, abdomen and pelvis was performed following the standard protocol during bolus administration of intravenous contrast. CONTRAST:  134mL ISOVUE-300 IOPAMIDOL (ISOVUE-300) INJECTION 61%  COMPARISON:  PET-CT dated 02/06/2017. CT chest abdomen pelvis dated 01/19/2017. FINDINGS: CT CHEST FINDINGS Cardiovascular: The heart is top-normal in size. No pericardial effusion. No evidence of thoracic aortic aneurysm. Atherosclerotic calcifications of the aortic arch. Three vessel coronary atherosclerosis. Right chest port terminates at the cavoatrial junction. Mediastinum/Nodes: Thoracic lymphadenopathy, including 10 mm short axis high right paratracheal node (series 2/image 8) and a 10 mm short axis right hilar node (series 2/image 19), decreased. Visualized thyroid is unremarkable. Lungs/Pleura: Left upper lobe mass extending into the left perihilar region (series 2/image 15), difficult to discretely measure  but significantly decreased, corresponding to the patient's known primary bronchogenic neoplasm. Bilateral pulmonary nodules, some of which are cavitary, new from recent PET. For example: --17 mm nodule in the posterior right upper lobe (series 4/image 26) --18 mm cavitary nodule in the right lower lobe (series 4/image 52) --22 mm cavitary nodule in the right lower lobe (series 4/image 66) No pleural effusion or pneumothorax. Musculoskeletal: Severe compression fracture deformity at T5 with mild retropulsion. Mild superior endplate compression fracture deformity at T7. Mild superior endplate changes at Q59. These findings are unchanged from the prior CT chest. CT ABDOMEN PELVIS FINDINGS Hepatobiliary: Multifocal hepatic metastases in both lobes, improved from prior CT. Dominant 3.2 by 4.0 cm mass in segment 4A (series 2/image 46), previously 7.1 x 6.5 cm. Distended gallbladder. No intrahepatic ductal dilatation. Dilated common duct, measuring 17 mm, although smoothly tapering at the ampulla. Pancreas: Within normal limits. Spleen: Within normal limits. Adrenals/Urinary Tract: Adrenal glands are within normal limits. Kidneys are within normal limits.  No hydronephrosis. Bladder is within normal limits.  Stomach/Bowel: Stomach is notable for a tiny hiatal hernia. No evidence of bowel obstruction. Appendix is not discretely visualized. Mild sigmoid diverticulosis, without evidence of diverticulitis. Vascular/Lymphatic: No evidence of abdominal aortic aneurysm. Atherosclerotic calcifications of the abdominal aorta and branch vessels. No suspicious abdominopelvic lymphadenopathy. Dominant portacaval node measures 6 mm short axis. Reproductive: Uterus is within normal limits. Bilateral ovaries are within normal limits. Other: No abdominopelvic ascites. Musculoskeletal: Mild degenerative changes of the lumbar spine. IMPRESSION: Left upper lobe mass extending into the left perihilar region is significantly decreased from the prior, corresponding to the patient's known primary bronchogenic neoplasm. Mild thoracic nodal metastases, improved. Prior upper abdominal lymphadenopathy has resolved. New bilateral pulmonary nodules, some of which are cavitary, worrisome for metastatic disease. Technically, infection could also have a similar appearance, although this is considered unlikely. Improving hepatic metastases. Known osseous metastases are not well visualized on CT. Electronically Signed   By: Julian Hy M.D.   On: 04/04/2017 17:23   Ct Abdomen Pelvis W Contrast  Result Date: 04/04/2017 CLINICAL DATA:  Small cell lung cancer EXAM: CT CHEST, ABDOMEN, AND PELVIS WITH CONTRAST TECHNIQUE: Multidetector CT imaging of the chest, abdomen and pelvis was performed following the standard protocol during bolus administration of intravenous contrast. CONTRAST:  172mL ISOVUE-300 IOPAMIDOL (ISOVUE-300) INJECTION 61% COMPARISON:  PET-CT dated 02/06/2017. CT chest abdomen pelvis dated 01/19/2017. FINDINGS: CT CHEST FINDINGS Cardiovascular: The heart is top-normal in size. No pericardial effusion. No evidence of thoracic aortic aneurysm. Atherosclerotic calcifications of the aortic arch. Three vessel coronary atherosclerosis.  Right chest port terminates at the cavoatrial junction. Mediastinum/Nodes: Thoracic lymphadenopathy, including 10 mm short axis high right paratracheal node (series 2/image 8) and a 10 mm short axis right hilar node (series 2/image 19), decreased. Visualized thyroid is unremarkable. Lungs/Pleura: Left upper lobe mass extending into the left perihilar region (series 2/image 15), difficult to discretely measure but significantly decreased, corresponding to the patient's known primary bronchogenic neoplasm. Bilateral pulmonary nodules, some of which are cavitary, new from recent PET. For example: --17 mm nodule in the posterior right upper lobe (series 4/image 26) --18 mm cavitary nodule in the right lower lobe (series 4/image 52) --22 mm cavitary nodule in the right lower lobe (series 4/image 66) No pleural effusion or pneumothorax. Musculoskeletal: Severe compression fracture deformity at T5 with mild retropulsion. Mild superior endplate compression fracture deformity at T7. Mild superior endplate changes at D63. These findings are unchanged from the prior CT chest.  CT ABDOMEN PELVIS FINDINGS Hepatobiliary: Multifocal hepatic metastases in both lobes, improved from prior CT. Dominant 3.2 by 4.0 cm mass in segment 4A (series 2/image 46), previously 7.1 x 6.5 cm. Distended gallbladder. No intrahepatic ductal dilatation. Dilated common duct, measuring 17 mm, although smoothly tapering at the ampulla. Pancreas: Within normal limits. Spleen: Within normal limits. Adrenals/Urinary Tract: Adrenal glands are within normal limits. Kidneys are within normal limits.  No hydronephrosis. Bladder is within normal limits. Stomach/Bowel: Stomach is notable for a tiny hiatal hernia. No evidence of bowel obstruction. Appendix is not discretely visualized. Mild sigmoid diverticulosis, without evidence of diverticulitis. Vascular/Lymphatic: No evidence of abdominal aortic aneurysm. Atherosclerotic calcifications of the abdominal aorta  and branch vessels. No suspicious abdominopelvic lymphadenopathy. Dominant portacaval node measures 6 mm short axis. Reproductive: Uterus is within normal limits. Bilateral ovaries are within normal limits. Other: No abdominopelvic ascites. Musculoskeletal: Mild degenerative changes of the lumbar spine. IMPRESSION: Left upper lobe mass extending into the left perihilar region is significantly decreased from the prior, corresponding to the patient's known primary bronchogenic neoplasm. Mild thoracic nodal metastases, improved. Prior upper abdominal lymphadenopathy has resolved. New bilateral pulmonary nodules, some of which are cavitary, worrisome for metastatic disease. Technically, infection could also have a similar appearance, although this is considered unlikely. Improving hepatic metastases. Known osseous metastases are not well visualized on CT. Electronically Signed   By: Julian Hy M.D.   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  PLAN:  1. 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  Patient expressed understanding that she has limited options therefore we will continue treatment as planned.  Will delay treatment 1 week secondary to patient's increased weakness and fatigue and decreased performance status. MRI of the brain on February 04, 2017 did not reveal any metastatic disease.  Return to clinic in 1 week for consideration of cycle 4, day 1 of carboplatinum, Tecentriq, and etoposide on day 1 and etoposide only on days 2 and 3. Will then continue Tecentriq maintenance treatment every 3 weeks.  Patient will also receive Zometa on the odd numbered cycles for her bony disease.   2.  Elevated liver enzymes: Resolved. 3.  Pain:  Patient states long-acting narcotics are too expensive and she only takes Percocet.  Had a lengthy discussion with the patient about adjusting her narcotic use. 4.  Anemia: Secondary to chemotherapy, monitor. 5.  COPD flare: Continue prednisone as prescribed.  Have also referred patient to outpatient palliative care for further evaluation and consideration of nebulizer treatments at home. 6.  Weakness and fatigue: Continue rehab as indicated. 7.  Disposition: Hospice and end-of-life care were previously discussed, but patient does not wish to pursue this option at this time.  She confirms that she is a DNR and is having outpatient palliative care visit.  Approximately 30 minutes was spent in discussion of which greater than 50% was consultation.  Patient expressed understanding and was in agreement with this plan. She also understands that She can call clinic at any time with any questions, concerns, or complaints.   Cancer Staging Small cell lung cancer [facility]) Staging form: Lung, AJCC 8th Edition - Clinical stage from 01/28/2017: Stage IV (cT4, cN3, pM1c) - Signed by Lloyd Huger, MD on 01/28/2017  Lloyd Huger, MD   04/14/2017 1:03 PM

## 2017-04-11 DIAGNOSIS — D6481 Anemia due to antineoplastic chemotherapy: Secondary | ICD-10-CM | POA: Diagnosis not present

## 2017-04-11 DIAGNOSIS — Z7951 Long term (current) use of inhaled steroids: Secondary | ICD-10-CM | POA: Diagnosis not present

## 2017-04-11 DIAGNOSIS — Z87891 Personal history of nicotine dependence: Secondary | ICD-10-CM | POA: Diagnosis not present

## 2017-04-11 DIAGNOSIS — C3490 Malignant neoplasm of unspecified part of unspecified bronchus or lung: Secondary | ICD-10-CM | POA: Diagnosis not present

## 2017-04-11 DIAGNOSIS — C7951 Secondary malignant neoplasm of bone: Secondary | ICD-10-CM | POA: Diagnosis not present

## 2017-04-11 DIAGNOSIS — C787 Secondary malignant neoplasm of liver and intrahepatic bile duct: Secondary | ICD-10-CM | POA: Diagnosis not present

## 2017-04-11 DIAGNOSIS — T451X5D Adverse effect of antineoplastic and immunosuppressive drugs, subsequent encounter: Secondary | ICD-10-CM | POA: Diagnosis not present

## 2017-04-11 DIAGNOSIS — Z95828 Presence of other vascular implants and grafts: Secondary | ICD-10-CM | POA: Diagnosis not present

## 2017-04-11 DIAGNOSIS — I428 Other cardiomyopathies: Secondary | ICD-10-CM | POA: Diagnosis not present

## 2017-04-11 DIAGNOSIS — F329 Major depressive disorder, single episode, unspecified: Secondary | ICD-10-CM | POA: Diagnosis not present

## 2017-04-11 DIAGNOSIS — I10 Essential (primary) hypertension: Secondary | ICD-10-CM | POA: Diagnosis not present

## 2017-04-11 DIAGNOSIS — J441 Chronic obstructive pulmonary disease with (acute) exacerbation: Secondary | ICD-10-CM | POA: Diagnosis not present

## 2017-04-12 ENCOUNTER — Other Ambulatory Visit: Payer: Self-pay

## 2017-04-12 ENCOUNTER — Inpatient Hospital Stay: Payer: Medicare Other

## 2017-04-12 ENCOUNTER — Inpatient Hospital Stay (HOSPITAL_BASED_OUTPATIENT_CLINIC_OR_DEPARTMENT_OTHER): Payer: Medicare Other | Admitting: Oncology

## 2017-04-12 ENCOUNTER — Encounter: Payer: Self-pay | Admitting: Oncology

## 2017-04-12 VITALS — BP 142/91 | HR 106 | Temp 97.5°F | Resp 12 | Ht 65.0 in

## 2017-04-12 DIAGNOSIS — C3490 Malignant neoplasm of unspecified part of unspecified bronchus or lung: Secondary | ICD-10-CM

## 2017-04-12 DIAGNOSIS — R000 Tachycardia, unspecified: Secondary | ICD-10-CM | POA: Diagnosis not present

## 2017-04-12 DIAGNOSIS — D696 Thrombocytopenia, unspecified: Secondary | ICD-10-CM | POA: Diagnosis not present

## 2017-04-12 DIAGNOSIS — I429 Cardiomyopathy, unspecified: Secondary | ICD-10-CM | POA: Diagnosis not present

## 2017-04-12 DIAGNOSIS — R630 Anorexia: Secondary | ICD-10-CM | POA: Diagnosis not present

## 2017-04-12 DIAGNOSIS — K219 Gastro-esophageal reflux disease without esophagitis: Secondary | ICD-10-CM

## 2017-04-12 DIAGNOSIS — C787 Secondary malignant neoplasm of liver and intrahepatic bile duct: Secondary | ICD-10-CM

## 2017-04-12 DIAGNOSIS — J449 Chronic obstructive pulmonary disease, unspecified: Secondary | ICD-10-CM | POA: Diagnosis not present

## 2017-04-12 DIAGNOSIS — Z79899 Other long term (current) drug therapy: Secondary | ICD-10-CM

## 2017-04-12 DIAGNOSIS — E785 Hyperlipidemia, unspecified: Secondary | ICD-10-CM

## 2017-04-12 DIAGNOSIS — D649 Anemia, unspecified: Secondary | ICD-10-CM | POA: Diagnosis not present

## 2017-04-12 DIAGNOSIS — K828 Other specified diseases of gallbladder: Secondary | ICD-10-CM

## 2017-04-12 DIAGNOSIS — I110 Hypertensive heart disease with heart failure: Secondary | ICD-10-CM | POA: Diagnosis not present

## 2017-04-12 DIAGNOSIS — R4182 Altered mental status, unspecified: Secondary | ICD-10-CM | POA: Diagnosis not present

## 2017-04-12 DIAGNOSIS — R062 Wheezing: Secondary | ICD-10-CM | POA: Diagnosis not present

## 2017-04-12 DIAGNOSIS — E871 Hypo-osmolality and hyponatremia: Secondary | ICD-10-CM | POA: Diagnosis not present

## 2017-04-12 DIAGNOSIS — F329 Major depressive disorder, single episode, unspecified: Secondary | ICD-10-CM

## 2017-04-12 DIAGNOSIS — M47816 Spondylosis without myelopathy or radiculopathy, lumbar region: Secondary | ICD-10-CM

## 2017-04-12 DIAGNOSIS — R05 Cough: Secondary | ICD-10-CM

## 2017-04-12 DIAGNOSIS — K5730 Diverticulosis of large intestine without perforation or abscess without bleeding: Secondary | ICD-10-CM | POA: Diagnosis not present

## 2017-04-12 DIAGNOSIS — F419 Anxiety disorder, unspecified: Secondary | ICD-10-CM | POA: Diagnosis not present

## 2017-04-12 DIAGNOSIS — N179 Acute kidney failure, unspecified: Secondary | ICD-10-CM

## 2017-04-12 DIAGNOSIS — Z87891 Personal history of nicotine dependence: Secondary | ICD-10-CM

## 2017-04-12 DIAGNOSIS — K838 Other specified diseases of biliary tract: Secondary | ICD-10-CM | POA: Diagnosis not present

## 2017-04-12 DIAGNOSIS — I509 Heart failure, unspecified: Secondary | ICD-10-CM

## 2017-04-12 DIAGNOSIS — C7951 Secondary malignant neoplasm of bone: Secondary | ICD-10-CM

## 2017-04-12 DIAGNOSIS — D709 Neutropenia, unspecified: Secondary | ICD-10-CM

## 2017-04-12 DIAGNOSIS — Z7952 Long term (current) use of systemic steroids: Secondary | ICD-10-CM

## 2017-04-12 DIAGNOSIS — Z5111 Encounter for antineoplastic chemotherapy: Secondary | ICD-10-CM

## 2017-04-12 DIAGNOSIS — G8929 Other chronic pain: Secondary | ICD-10-CM | POA: Diagnosis not present

## 2017-04-12 DIAGNOSIS — I700 Atherosclerosis of aorta: Secondary | ICD-10-CM

## 2017-04-12 LAB — CBC WITH DIFFERENTIAL/PLATELET
BASOS ABS: 0.1 10*3/uL (ref 0–0.1)
BASOS PCT: 1 %
Eosinophils Absolute: 0 10*3/uL (ref 0–0.7)
Eosinophils Relative: 0 %
HEMATOCRIT: 31.5 % — AB (ref 35.0–47.0)
Hemoglobin: 10.5 g/dL — ABNORMAL LOW (ref 12.0–16.0)
LYMPHS PCT: 13 %
Lymphs Abs: 2.9 10*3/uL (ref 1.0–3.6)
MCH: 32 pg (ref 26.0–34.0)
MCHC: 33.2 g/dL (ref 32.0–36.0)
MCV: 96.2 fL (ref 80.0–100.0)
Monocytes Absolute: 1.3 10*3/uL — ABNORMAL HIGH (ref 0.2–0.9)
Monocytes Relative: 6 %
NEUTROS ABS: 18.4 10*3/uL — AB (ref 1.4–6.5)
NEUTROS PCT: 80 %
Platelets: 331 10*3/uL (ref 150–440)
RBC: 3.28 MIL/uL — AB (ref 3.80–5.20)
RDW: 17.6 % — ABNORMAL HIGH (ref 11.5–14.5)
WBC: 22.7 10*3/uL — AB (ref 3.6–11.0)

## 2017-04-12 LAB — COMPREHENSIVE METABOLIC PANEL
ALBUMIN: 3.5 g/dL (ref 3.5–5.0)
ALT: 30 U/L (ref 14–54)
ANION GAP: 10 (ref 5–15)
AST: 24 U/L (ref 15–41)
Alkaline Phosphatase: 73 U/L (ref 38–126)
BILIRUBIN TOTAL: 0.4 mg/dL (ref 0.3–1.2)
BUN: 24 mg/dL — ABNORMAL HIGH (ref 6–20)
CO2: 22 mmol/L (ref 22–32)
Calcium: 8.9 mg/dL (ref 8.9–10.3)
Chloride: 104 mmol/L (ref 101–111)
Creatinine, Ser: 0.72 mg/dL (ref 0.44–1.00)
Glucose, Bld: 141 mg/dL — ABNORMAL HIGH (ref 65–99)
Potassium: 3.4 mmol/L — ABNORMAL LOW (ref 3.5–5.1)
Sodium: 136 mmol/L (ref 135–145)
TOTAL PROTEIN: 7.2 g/dL (ref 6.5–8.1)

## 2017-04-12 MED ORDER — HEPARIN SOD (PORK) LOCK FLUSH 100 UNIT/ML IV SOLN
500.0000 [IU] | Freq: Once | INTRAVENOUS | Status: AC
  Administered 2017-04-12: 500 [IU] via INTRAVENOUS
  Filled 2017-04-12: qty 5

## 2017-04-12 NOTE — Progress Notes (Signed)
Patient here for pre treatment. She reports that she has been very tired since her hospitalization.

## 2017-04-13 ENCOUNTER — Inpatient Hospital Stay: Payer: Medicare Other

## 2017-04-13 DIAGNOSIS — I429 Cardiomyopathy, unspecified: Secondary | ICD-10-CM | POA: Diagnosis not present

## 2017-04-13 DIAGNOSIS — J449 Chronic obstructive pulmonary disease, unspecified: Secondary | ICD-10-CM | POA: Diagnosis not present

## 2017-04-13 DIAGNOSIS — F3342 Major depressive disorder, recurrent, in full remission: Secondary | ICD-10-CM | POA: Diagnosis not present

## 2017-04-13 DIAGNOSIS — E860 Dehydration: Secondary | ICD-10-CM | POA: Diagnosis not present

## 2017-04-13 LAB — THYROID PANEL WITH TSH
Free Thyroxine Index: 2.2 (ref 1.2–4.9)
T3 Uptake Ratio: 31 % (ref 24–39)
T4, Total: 7.1 ug/dL (ref 4.5–12.0)
TSH: 4.51 u[IU]/mL — ABNORMAL HIGH (ref 0.450–4.500)

## 2017-04-14 ENCOUNTER — Inpatient Hospital Stay: Payer: Medicare Other

## 2017-04-15 NOTE — Progress Notes (Signed)
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  INTERVAL HISTORY: Patient returns to clinic today for hospital follow-up and reconsideration of cycle 4, day 1 of carboplatinum, etoposide, and Tecentriq.  Her weakness and fatigue have significantly improved and she is nearly back to her baseline.  She continues to have diarrhea.  She has chronic pain and is seen in the pain clinic on a regular basis, but otherwise feels well.  She has no neurologic complaints.  She denies any recent fevers. She has a good appetite and denies weight loss.  She has chronic shortness of breath, but denies any chest pain or hemoptysis.  She denies any nausea, vomiting, or constipation.  She has no abdominal pain.  She denies any melena or hematochezia.  She has no urinary complaints.  Patient offers no further specific complaints today.  REVIEW OF SYSTEMS:   Review of Systems  Constitutional: Positive for weight loss. Negative for fever and malaise/fatigue.  Respiratory: Positive for cough. Negative for hemoptysis, shortness of breath and wheezing.   Cardiovascular: Negative.  Negative for chest pain and leg swelling.  Gastrointestinal: Positive for diarrhea. Negative for abdominal pain, blood in stool and melena.  Genitourinary: Negative.   Musculoskeletal: Positive for back pain, joint pain and neck pain.  Skin: Negative.  Negative for rash.  Neurological: Positive for weakness. Negative for sensory change.  Psychiatric/Behavioral: Negative.  The patient is not nervous/anxious.     As per HPI. Otherwise, a complete review of systems is negative.  PAST MEDICAL HISTORY: Past Medical History:  Diagnosis Date  .  Anxiety   . Arthritis   . Asthma    COPD  . Cancer ([place])    Liver  . CAP (community acquired pneumonia) 06/30/2014  . Cardiomyopathy ([place])    nonischemic (EF 35-40%)  . CHF (congestive heart failure) ([place])   . COPD (chronic obstructive pulmonary disease) ([place])   . Depression   . Dyspnea   . GERD (gastroesophageal reflux disease)   . Hyperlipidemia   . Hypertension   . Pneumonia   . PVC's (premature ventricular contractions)    states 10 years ago  . Sepsis ([place]) 06/30/2014  . SOB (shortness of breath) 05/08/2014  . Spinal cord injury at T7-T12 level [facility])    2016    PAST SURGICAL HISTORY: Past Surgical History:  Procedure Laterality Date  . APPENDECTOMY    . CARDIAC CATHETERIZATION    . CATARACT EXTRACTION     right eye   . CESAREAN SECTION    . COLONOSCOPY    . LAMINOTOMY    . LUMBAR LAMINECTOMY/DECOMPRESSION MICRODISCECTOMY Left 08/01/2016   Procedure: Left Lumbar Four-Five Laminectomy and Foraminotomy;  Surgeon: Earnie Larsson, MD;  Location: [facility];  Service: Neurosurgery;  Laterality: Left;  . PORTA CATH INSERTION N/A 02/03/2017   Procedure: PORTA CATH INSERTION;  Surgeon: Katha Cabal, MD;  Location: [place];  Service: Cardiovascular;  Laterality: N/A;    FAMILY HISTORY: Family History  Problem Relation Age of Onset  . Heart attack Father 29       MI x 3     ADVANCED DIRECTIVES (Y/N):  N  HEALTH MAINTENANCE: Social History   Tobacco Use  . Smoking status:  Former Smoker    Packs/day: 0.50    Years: 50.00    Pack years: 25.00    Types: Cigarettes    Last attempt to quit: 01/27/2015    Years since quitting: 2.2  . Smokeless tobacco: Never Used  Substance Use Topics  . Alcohol use: No  . Drug use: No     Colonoscopy:  PAP:  Bone density:  Lipid panel:  Allergies  Allergen Reactions  . Iodine Anaphylaxis    IVP dye - has been premedicated previously without incidence. See 01/29/17 CT notes.  . Fentanyl     Overly sedated and  groggy, ineffective     Current Outpatient Medications  Medication Sig Dispense Refill  . ADVAIR DISKUS 250-50 MCG/DOSE AEPB Inhale 1 puff into the lungs 2 (two) times daily.    Marland Kitchen albuterol (PROAIR HFA) 108 (90 Base) MCG/ACT inhaler Inhale 2 puffs into the lungs every 6 (six) hours as needed for wheezing or shortness of breath.     Marland Kitchen alendronate (FOSAMAX) 70 MG tablet Take 70 mg by mouth every Thursday.     . ARIPiprazole (ABILIFY) 5 MG tablet Take 5 mg by mouth daily.    . Calcium Citrate-Vitamin D (CALCIUM + D PO) Take 1 tablet by mouth 2 (two) times daily.    . DULoxetine (CYMBALTA) 60 MG capsule Take 60 mg by mouth daily.    . metoprolol succinate (TOPROL-XL) 100 MG 24 hr tablet TAKE ONE TABLET BY MOUTH ONCE DAILY 30 tablet 0  . oxyCODONE-acetaminophen (PERCOCET/ROXICET) 5-325 MG tablet Take 1-2 tablets by mouth every 6 (six) hours as needed. For pain. 120 tablet 0  . sacubitril-valsartan (ENTRESTO) 49-51 MG Take 1 tablet by mouth 2 (two) times daily. (Patient taking differently: Take 1 tablet by mouth daily. ) 60 tablet 6  . benzonatate (TESSALON) 100 MG capsule Take 1 capsule (100 mg total) by mouth every 8 (eight) hours as needed for cough. (Patient not taking: Reported on 04/19/2017) 20 capsule 0  . diphenoxylate-atropine (LOMOTIL) 2.5-0.025 MG tablet Take 1 tablet by mouth 4 (four) times daily as needed for diarrhea or loose stools. 30 tablet 0  . ipratropium-albuterol (DUONEB) 0.5-2.5 (3) MG/3ML SOLN Take 3 mLs by nebulization every 4 (four) hours as needed. (Patient not taking: Reported on 04/19/2017) 360 mL 1  . lidocaine-prilocaine (EMLA) cream Apply to affected area once (Patient not taking: Reported on 04/19/2017) 30 g 3  . potassium chloride SA (K-DUR,KLOR-CON) 20 MEQ tablet Take 1 tablet (20 mEq total) by mouth 2 (two) times daily. (Patient not taking: Reported on 04/19/2017) 60 tablet 1  . prochlorperazine (COMPAZINE) 10 MG tablet Take 1 tablet (10 mg total) by mouth every 6 (six) hours  as needed (Nausea or vomiting). 60 tablet 2   No current facility-administered medications for this visit.    Facility-Administered Medications Ordered in Other Visits  Medication Dose Route Frequency Provider Last Rate Last Dose  . atezolizumab (TECENTRIQ) 1,200 mg in sodium chloride 0.9 % 250 mL chemo infusion  1,200 mg Intravenous Once Lloyd Huger, MD      . CARBOplatin (PARAPLATIN) 550 mg in sodium chloride 0.9 % 250 mL chemo infusion  550 mg Intravenous Once Lloyd Huger, MD      . etoposide (VEPESID) 200 mg in sodium chloride 0.9 % 500 mL chemo infusion  200 mg Intravenous Once Lloyd Huger, MD      . [COMPLETED] heparin lock flush 100 unit/mL  500 Units Intravenous Once Lloyd Huger,  MD   500 Units at 04/19/17 1138  . sodium chloride flush (NS) 0.9 % injection 10 mL  10 mL Intravenous PRN Lloyd Huger, MD   10 mL at 03/01/17 0841  . sodium chloride flush (NS) 0.9 % injection 10 mL  10 mL Intravenous PRN Lloyd Huger, MD   10 mL at 04/19/17 0840    OBJECTIVE: Vitals:   04/19/17 0903  BP: 110/75  Pulse: 89  Resp: 18  Temp: (!) 96.4 F (35.8 C)     Body mass index is 35.11 kg/m.    ECOG FS:1 - Symptomatic but completely ambulatory  General: No acute distress.  Sitting in a wheelchair. Eyes: Pink conjunctiva, anicteric sclera. Lungs: Diminished breath sounds bilaterally, scattered wheezing. Heart: Regular rate and rhythm. No rubs, murmurs, or gallops. Abdomen: Soft, nontender, nondistended. No organomegaly noted, normoactive bowel sounds. Musculoskeletal: No edema, cyanosis, or clubbing. Neuro: Alert, answering all questions appropriately. Cranial nerves grossly intact. Skin: No rashes or petechiae noted. Psych: Normal affect.   LAB RESULTS:  Lab Results  Component Value Date   NA 137 04/19/2017   K 3.9 04/19/2017   CL 105 04/19/2017   CO2 24 04/19/2017   GLUCOSE 122 (H) 04/19/2017   BUN 13 04/19/2017   CREATININE 0.55  04/19/2017   CALCIUM 8.6 (L) 04/19/2017   PROT 7.0 04/19/2017   ALBUMIN 3.5 04/19/2017   AST 23 04/19/2017   ALT 26 04/19/2017   ALKPHOS 61 04/19/2017   BILITOT 0.2 (L) 04/19/2017   GFRNONAA >60 04/19/2017   GFRAA >60 04/19/2017    Lab Results  Component Value Date   WBC 6.5 04/19/2017   NEUTROABS 4.0 04/19/2017   HGB 10.0 (L) 04/19/2017   HCT 29.7 (L) 04/19/2017   MCV 97.3 04/19/2017   PLT 275 04/19/2017     STUDIES: Dg Chest 2 View  Result Date: 03/22/2017 CLINICAL DATA:  Shortness of breath.  Cough. EXAM: CHEST  2 VIEW COMPARISON:  Radiograph of February 20, 2017. PET scan of February 06, 2017. FINDINGS: Stable cardiomediastinal silhouette. Atherosclerosis of thoracic aorta is noted. No pneumothorax or pleural effusion is noted. Right internal jugular Port-A-Cath is unchanged in position. Interval development of bilateral pulmonary nodules are noted concerning for metastatic disease. Bony thorax is unremarkable. IMPRESSION: Aortic atherosclerosis. Interval development of bilateral pulmonary nodules is noted concerning for metastatic disease. CT scan of the chest is recommended for further evaluation. Electronically Signed   By: Marijo Conception, M.D.   On: 03/22/2017 13:59   Ct Head Wo Contrast  Result Date: 04/04/2017 CLINICAL DATA:  67 year old female with altered level of consciousness. Small cell lung cancer. Last chemotherapy 1 week ago. Initial encounter. EXAM: CT HEAD WITHOUT CONTRAST TECHNIQUE: Contiguous axial images were obtained from the base of the skull through the vertex without intravenous contrast. COMPARISON:  02/04/2017 brain MR. FINDINGS: Brain: No intracranial hemorrhage or CT evidence of large acute infarct. Moderate chronic microvascular changes. Atrophy.  No intracranial mass lesion noted on this unenhanced exam. Vascular: Vascular calcifications Skull: No worrisome destructive lesion. Sinuses/Orbits: No acute orbital abnormality. Visualized paranasal sinuses  clear. Other: Mastoid air cells and middle ear cavities are clear. IMPRESSION: No acute intracranial abnormality noted. Moderate chronic microvascular changes. Atrophy. No intracranial mass lesion noted on this unenhanced exam. No intracranial enhancing lesion noted on 02/04/2017 MR. If intracranial metastatic disease were of high clinical concern, follow-up MR may then be considered. Electronically Signed   By: Genia Del M.D.   On: 04/04/2017  12:25   Ct Chest W Contrast  Result Date: 04/04/2017 CLINICAL DATA:  Small cell lung cancer EXAM: CT CHEST, ABDOMEN, AND PELVIS WITH CONTRAST TECHNIQUE: Multidetector CT imaging of the chest, abdomen and pelvis was performed following the standard protocol during bolus administration of intravenous contrast. CONTRAST:  162mL ISOVUE-300 IOPAMIDOL (ISOVUE-300) INJECTION 61% COMPARISON:  PET-CT dated 02/06/2017. CT chest abdomen pelvis dated 01/19/2017. FINDINGS: CT CHEST FINDINGS Cardiovascular: The heart is top-normal in size. No pericardial effusion. No evidence of thoracic aortic aneurysm. Atherosclerotic calcifications of the aortic arch. Three vessel coronary atherosclerosis. Right chest port terminates at the cavoatrial junction. Mediastinum/Nodes: Thoracic lymphadenopathy, including 10 mm short axis high right paratracheal node (series 2/image 8) and a 10 mm short axis right hilar node (series 2/image 19), decreased. Visualized thyroid is unremarkable. Lungs/Pleura: Left upper lobe mass extending into the left perihilar region (series 2/image 15), difficult to discretely measure but significantly decreased, corresponding to the patient's known primary bronchogenic neoplasm. Bilateral pulmonary nodules, some of which are cavitary, new from recent PET. For example: --17 mm nodule in the posterior right upper lobe (series 4/image 26) --18 mm cavitary nodule in the right lower lobe (series 4/image 52) --22 mm cavitary nodule in the right lower lobe (series 4/image 66)  No pleural effusion or pneumothorax. Musculoskeletal: Severe compression fracture deformity at T5 with mild retropulsion. Mild superior endplate compression fracture deformity at T7. Mild superior endplate changes at S06. These findings are unchanged from the prior CT chest. CT ABDOMEN PELVIS FINDINGS Hepatobiliary: Multifocal hepatic metastases in both lobes, improved from prior CT. Dominant 3.2 by 4.0 cm mass in segment 4A (series 2/image 46), previously 7.1 x 6.5 cm. Distended gallbladder. No intrahepatic ductal dilatation. Dilated common duct, measuring 17 mm, although smoothly tapering at the ampulla. Pancreas: Within normal limits. Spleen: Within normal limits. Adrenals/Urinary Tract: Adrenal glands are within normal limits. Kidneys are within normal limits.  No hydronephrosis. Bladder is within normal limits. Stomach/Bowel: Stomach is notable for a tiny hiatal hernia. No evidence of bowel obstruction. Appendix is not discretely visualized. Mild sigmoid diverticulosis, without evidence of diverticulitis. Vascular/Lymphatic: No evidence of abdominal aortic aneurysm. Atherosclerotic calcifications of the abdominal aorta and branch vessels. No suspicious abdominopelvic lymphadenopathy. Dominant portacaval node measures 6 mm short axis. Reproductive: Uterus is within normal limits. Bilateral ovaries are within normal limits. Other: No abdominopelvic ascites. Musculoskeletal: Mild degenerative changes of the lumbar spine. IMPRESSION: Left upper lobe mass extending into the left perihilar region is significantly decreased from the prior, corresponding to the patient's known primary bronchogenic neoplasm. Mild thoracic nodal metastases, improved. Prior upper abdominal lymphadenopathy has resolved. New bilateral pulmonary nodules, some of which are cavitary, worrisome for metastatic disease. Technically, infection could also have a similar appearance, although this is considered unlikely. Improving hepatic metastases.  Known osseous metastases are not well visualized on CT. Electronically Signed   By: Julian Hy M.D.   On: 04/04/2017 17:23   Ct Abdomen Pelvis W Contrast  Result Date: 04/04/2017 CLINICAL DATA:  Small cell lung cancer EXAM: CT CHEST, ABDOMEN, AND PELVIS WITH CONTRAST TECHNIQUE: Multidetector CT imaging of the chest, abdomen and pelvis was performed following the standard protocol during bolus administration of intravenous contrast. CONTRAST:  177mL ISOVUE-300 IOPAMIDOL (ISOVUE-300) INJECTION 61% COMPARISON:  PET-CT dated 02/06/2017. CT chest abdomen pelvis dated 01/19/2017. FINDINGS: CT CHEST FINDINGS Cardiovascular: The heart is top-normal in size. No pericardial effusion. No evidence of thoracic aortic aneurysm. Atherosclerotic calcifications of the aortic arch. Three vessel coronary atherosclerosis. Right chest  port terminates at the cavoatrial junction. Mediastinum/Nodes: Thoracic lymphadenopathy, including 10 mm short axis high right paratracheal node (series 2/image 8) and a 10 mm short axis right hilar node (series 2/image 19), decreased. Visualized thyroid is unremarkable. Lungs/Pleura: Left upper lobe mass extending into the left perihilar region (series 2/image 15), difficult to discretely measure but significantly decreased, corresponding to the patient's known primary bronchogenic neoplasm. Bilateral pulmonary nodules, some of which are cavitary, new from recent PET. For example: --17 mm nodule in the posterior right upper lobe (series 4/image 26) --18 mm cavitary nodule in the right lower lobe (series 4/image 52) --22 mm cavitary nodule in the right lower lobe (series 4/image 66) No pleural effusion or pneumothorax. Musculoskeletal: Severe compression fracture deformity at T5 with mild retropulsion. Mild superior endplate compression fracture deformity at T7. Mild superior endplate changes at H29. These findings are unchanged from the prior CT chest. CT ABDOMEN PELVIS FINDINGS Hepatobiliary:  Multifocal hepatic metastases in both lobes, improved from prior CT. Dominant 3.2 by 4.0 cm mass in segment 4A (series 2/image 46), previously 7.1 x 6.5 cm. Distended gallbladder. No intrahepatic ductal dilatation. Dilated common duct, measuring 17 mm, although smoothly tapering at the ampulla. Pancreas: Within normal limits. Spleen: Within normal limits. Adrenals/Urinary Tract: Adrenal glands are within normal limits. Kidneys are within normal limits.  No hydronephrosis. Bladder is within normal limits. Stomach/Bowel: Stomach is notable for a tiny hiatal hernia. No evidence of bowel obstruction. Appendix is not discretely visualized. Mild sigmoid diverticulosis, without evidence of diverticulitis. Vascular/Lymphatic: No evidence of abdominal aortic aneurysm. Atherosclerotic calcifications of the abdominal aorta and branch vessels. No suspicious abdominopelvic lymphadenopathy. Dominant portacaval node measures 6 mm short axis. Reproductive: Uterus is within normal limits. Bilateral ovaries are within normal limits. Other: No abdominopelvic ascites. Musculoskeletal: Mild degenerative changes of the lumbar spine. IMPRESSION: Left upper lobe mass extending into the left perihilar region is significantly decreased from the prior, corresponding to the patient's known primary bronchogenic neoplasm. Mild thoracic nodal metastases, improved. Prior upper abdominal lymphadenopathy has resolved. New bilateral pulmonary nodules, some of which are cavitary, worrisome for metastatic disease. Technically, infection could also have a similar appearance, although this is considered unlikely. Improving hepatic metastases. Known osseous metastases are not well visualized on CT. Electronically Signed   By: Julian Hy M.D.   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  PLAN:  1. 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  Patient expressed understanding that she has limited options therefore we will continue treatment as planned.  MRI of the brain on February 04, 2017 did not reveal any metastatic disease.  Proceed with cycle 4, day 1 of carboplatinum, Tecentriq, and etoposide on day 1 and etoposide only on days 2 and 3.  Because patient has possible new disease, will continue treatment for 2 additional cycles prior to continuing with Tecentriq maintenance treatment every 3 weeks.  Patient will also receive Zometa today.   2.  Elevated liver enzymes: Resolved. 3.  Pain: Patient states long-acting narcotics are too expensive and she only takes Percocet.  Patient requested a refill for her Percocet today, but appears she just had a refill approximately 1 week ago.  Exline 4.  Anemia: Secondary to chemotherapy, monitor. 5.  COPD  flare: Improved.  Have also referred patient to outpatient palliative care for further evaluation and consideration of nebulizer treatments at home. 6.  Weakness and fatigue: Continue rehab as indicated. 7.  Disposition: Hospice and end-of-life care were previously discussed, but patient does not wish to pursue this option at this time.  She confirms that she is a DNR and is having outpatient palliative care visit. 8.  Diarrhea: Patient was given a prescription for Lomotil today.  If it does not resolve, she has been instructed to call clinic and we will consider steroid taper.  Approximately 30 minutes was spent in discussion of which greater than 50% was consultation.  Patient expressed understanding and was in agreement with this plan. She also understands that She can call clinic at any time with any questions, concerns, or complaints.   Cancer Staging Small cell lung cancer [facility]) Staging form: Lung, AJCC 8th Edition -  Clinical stage from 01/28/2017: Stage IV (cT4, cN3, pM1c) - Signed by Lloyd Huger, MD on 01/28/2017   Lloyd Huger, MD   04/19/2017 9:51 AM

## 2017-04-17 DIAGNOSIS — C787 Secondary malignant neoplasm of liver and intrahepatic bile duct: Secondary | ICD-10-CM | POA: Diagnosis not present

## 2017-04-17 DIAGNOSIS — D6481 Anemia due to antineoplastic chemotherapy: Secondary | ICD-10-CM | POA: Diagnosis not present

## 2017-04-17 DIAGNOSIS — C3490 Malignant neoplasm of unspecified part of unspecified bronchus or lung: Secondary | ICD-10-CM | POA: Diagnosis not present

## 2017-04-17 DIAGNOSIS — T451X5D Adverse effect of antineoplastic and immunosuppressive drugs, subsequent encounter: Secondary | ICD-10-CM | POA: Diagnosis not present

## 2017-04-17 DIAGNOSIS — J441 Chronic obstructive pulmonary disease with (acute) exacerbation: Secondary | ICD-10-CM | POA: Diagnosis not present

## 2017-04-17 DIAGNOSIS — C7951 Secondary malignant neoplasm of bone: Secondary | ICD-10-CM | POA: Diagnosis not present

## 2017-04-18 DIAGNOSIS — D6481 Anemia due to antineoplastic chemotherapy: Secondary | ICD-10-CM | POA: Diagnosis not present

## 2017-04-18 DIAGNOSIS — C7951 Secondary malignant neoplasm of bone: Secondary | ICD-10-CM | POA: Diagnosis not present

## 2017-04-18 DIAGNOSIS — J441 Chronic obstructive pulmonary disease with (acute) exacerbation: Secondary | ICD-10-CM | POA: Diagnosis not present

## 2017-04-18 DIAGNOSIS — C3490 Malignant neoplasm of unspecified part of unspecified bronchus or lung: Secondary | ICD-10-CM | POA: Diagnosis not present

## 2017-04-18 DIAGNOSIS — T451X5D Adverse effect of antineoplastic and immunosuppressive drugs, subsequent encounter: Secondary | ICD-10-CM | POA: Diagnosis not present

## 2017-04-18 DIAGNOSIS — C787 Secondary malignant neoplasm of liver and intrahepatic bile duct: Secondary | ICD-10-CM | POA: Diagnosis not present

## 2017-04-19 ENCOUNTER — Inpatient Hospital Stay: Payer: Medicare Other | Attending: Oncology

## 2017-04-19 ENCOUNTER — Inpatient Hospital Stay (HOSPITAL_BASED_OUTPATIENT_CLINIC_OR_DEPARTMENT_OTHER): Payer: Medicare Other | Admitting: Oncology

## 2017-04-19 ENCOUNTER — Other Ambulatory Visit: Payer: Self-pay

## 2017-04-19 VITALS — BP 110/75 | HR 89 | Temp 96.4°F | Resp 18 | Wt 211.0 lb

## 2017-04-19 DIAGNOSIS — Z5111 Encounter for antineoplastic chemotherapy: Secondary | ICD-10-CM | POA: Insufficient documentation

## 2017-04-19 DIAGNOSIS — Z5112 Encounter for antineoplastic immunotherapy: Secondary | ICD-10-CM

## 2017-04-19 DIAGNOSIS — C3490 Malignant neoplasm of unspecified part of unspecified bronchus or lung: Secondary | ICD-10-CM | POA: Insufficient documentation

## 2017-04-19 DIAGNOSIS — I700 Atherosclerosis of aorta: Secondary | ICD-10-CM | POA: Diagnosis not present

## 2017-04-19 DIAGNOSIS — Z87891 Personal history of nicotine dependence: Secondary | ICD-10-CM | POA: Insufficient documentation

## 2017-04-19 DIAGNOSIS — R5383 Other fatigue: Secondary | ICD-10-CM | POA: Diagnosis not present

## 2017-04-19 DIAGNOSIS — C3491 Malignant neoplasm of unspecified part of right bronchus or lung: Secondary | ICD-10-CM | POA: Insufficient documentation

## 2017-04-19 DIAGNOSIS — J449 Chronic obstructive pulmonary disease, unspecified: Secondary | ICD-10-CM | POA: Insufficient documentation

## 2017-04-19 DIAGNOSIS — C787 Secondary malignant neoplasm of liver and intrahepatic bile duct: Secondary | ICD-10-CM | POA: Diagnosis not present

## 2017-04-19 DIAGNOSIS — T451X5S Adverse effect of antineoplastic and immunosuppressive drugs, sequela: Secondary | ICD-10-CM | POA: Diagnosis not present

## 2017-04-19 DIAGNOSIS — R197 Diarrhea, unspecified: Secondary | ICD-10-CM | POA: Insufficient documentation

## 2017-04-19 DIAGNOSIS — D6481 Anemia due to antineoplastic chemotherapy: Secondary | ICD-10-CM | POA: Insufficient documentation

## 2017-04-19 DIAGNOSIS — E785 Hyperlipidemia, unspecified: Secondary | ICD-10-CM | POA: Insufficient documentation

## 2017-04-19 DIAGNOSIS — C7951 Secondary malignant neoplasm of bone: Secondary | ICD-10-CM

## 2017-04-19 DIAGNOSIS — K219 Gastro-esophageal reflux disease without esophagitis: Secondary | ICD-10-CM | POA: Insufficient documentation

## 2017-04-19 DIAGNOSIS — Z7689 Persons encountering health services in other specified circumstances: Secondary | ICD-10-CM | POA: Insufficient documentation

## 2017-04-19 DIAGNOSIS — I429 Cardiomyopathy, unspecified: Secondary | ICD-10-CM | POA: Diagnosis not present

## 2017-04-19 DIAGNOSIS — R531 Weakness: Secondary | ICD-10-CM | POA: Diagnosis not present

## 2017-04-19 DIAGNOSIS — Z79899 Other long term (current) drug therapy: Secondary | ICD-10-CM | POA: Insufficient documentation

## 2017-04-19 DIAGNOSIS — F418 Other specified anxiety disorders: Secondary | ICD-10-CM | POA: Diagnosis not present

## 2017-04-19 DIAGNOSIS — Z66 Do not resuscitate: Secondary | ICD-10-CM | POA: Insufficient documentation

## 2017-04-19 DIAGNOSIS — Z8619 Personal history of other infectious and parasitic diseases: Secondary | ICD-10-CM | POA: Insufficient documentation

## 2017-04-19 DIAGNOSIS — I509 Heart failure, unspecified: Secondary | ICD-10-CM | POA: Diagnosis not present

## 2017-04-19 DIAGNOSIS — R634 Abnormal weight loss: Secondary | ICD-10-CM | POA: Diagnosis not present

## 2017-04-19 DIAGNOSIS — G8929 Other chronic pain: Secondary | ICD-10-CM | POA: Diagnosis not present

## 2017-04-19 LAB — CBC WITH DIFFERENTIAL/PLATELET
BASOS ABS: 0.1 10*3/uL (ref 0–0.1)
Basophils Relative: 1 %
Eosinophils Absolute: 0 10*3/uL (ref 0–0.7)
Eosinophils Relative: 0 %
HEMATOCRIT: 29.7 % — AB (ref 35.0–47.0)
HEMOGLOBIN: 10 g/dL — AB (ref 12.0–16.0)
LYMPHS PCT: 24 %
Lymphs Abs: 1.6 10*3/uL (ref 1.0–3.6)
MCH: 32.9 pg (ref 26.0–34.0)
MCHC: 33.8 g/dL (ref 32.0–36.0)
MCV: 97.3 fL (ref 80.0–100.0)
MONO ABS: 0.9 10*3/uL (ref 0.2–0.9)
Monocytes Relative: 13 %
NEUTROS ABS: 4 10*3/uL (ref 1.4–6.5)
Neutrophils Relative %: 62 %
Platelets: 275 10*3/uL (ref 150–440)
RBC: 3.05 MIL/uL — ABNORMAL LOW (ref 3.80–5.20)
RDW: 21 % — AB (ref 11.5–14.5)
WBC: 6.5 10*3/uL (ref 3.6–11.0)

## 2017-04-19 LAB — COMPREHENSIVE METABOLIC PANEL
ALBUMIN: 3.5 g/dL (ref 3.5–5.0)
ALT: 26 U/L (ref 14–54)
ANION GAP: 8 (ref 5–15)
AST: 23 U/L (ref 15–41)
Alkaline Phosphatase: 61 U/L (ref 38–126)
BUN: 13 mg/dL (ref 6–20)
CO2: 24 mmol/L (ref 22–32)
Calcium: 8.6 mg/dL — ABNORMAL LOW (ref 8.9–10.3)
Chloride: 105 mmol/L (ref 101–111)
Creatinine, Ser: 0.55 mg/dL (ref 0.44–1.00)
GFR calc Af Amer: >60 mL/min (ref >60)
GFR calc non Af Amer: >60 mL/min (ref >60)
GLUCOSE: 122 mg/dL — AB (ref 65–99)
POTASSIUM: 3.9 mmol/L (ref 3.5–5.1)
SODIUM: 137 mmol/L (ref 135–145)
Total Bilirubin: 0.2 mg/dL — ABNORMAL LOW (ref 0.3–1.2)
Total Protein: 7 g/dL (ref 6.5–8.1)

## 2017-04-19 MED ORDER — DIPHENOXYLATE-ATROPINE 2.5-0.025 MG PO TABS: 1.0000 | ORAL_TABLET | Freq: Four times a day (QID) | ORAL | 0 refills | Status: DC | PRN

## 2017-04-19 MED ORDER — ZOLEDRONIC ACID 4 MG/100ML IV SOLN
4.0000 mg | Freq: Once | INTRAVENOUS | Status: AC
  Administered 2017-04-19: 4 mg via INTRAVENOUS
  Filled 2017-04-19: qty 100

## 2017-04-19 MED ORDER — DEXAMETHASONE SODIUM PHOSPHATE 10 MG/ML IJ SOLN
10.0000 mg | Freq: Once | INTRAMUSCULAR | Status: AC
  Administered 2017-04-19: 10 mg via INTRAVENOUS
  Filled 2017-04-19: qty 1

## 2017-04-19 MED ORDER — PALONOSETRON HCL INJECTION 0.25 MG/5ML
0.2500 mg | Freq: Once | INTRAVENOUS | Status: AC
  Administered 2017-04-19: 0.25 mg via INTRAVENOUS
  Filled 2017-04-19: qty 5

## 2017-04-19 MED ORDER — SODIUM CHLORIDE 0.9 % IV SOLN
Freq: Once | INTRAVENOUS | Status: AC
  Administered 2017-04-19: 10:00:00 via INTRAVENOUS
  Filled 2017-04-19: qty 1000

## 2017-04-19 MED ORDER — SODIUM CHLORIDE 0.9% FLUSH
10.0000 mL | INTRAVENOUS | Status: DC | PRN
  Administered 2017-04-19: 10 mL via INTRAVENOUS
  Filled 2017-04-19: qty 10

## 2017-04-19 MED ORDER — SODIUM CHLORIDE 0.9 % IV SOLN
200.0000 mg | Freq: Once | INTRAVENOUS | Status: AC
  Administered 2017-04-19: 200 mg via INTRAVENOUS
  Filled 2017-04-19: qty 10

## 2017-04-19 MED ORDER — SODIUM CHLORIDE 0.9 % IV SOLN
1200.0000 mg | Freq: Once | INTRAVENOUS | Status: AC
  Administered 2017-04-19: 1200 mg via INTRAVENOUS
  Filled 2017-04-19: qty 20

## 2017-04-19 MED ORDER — SODIUM CHLORIDE 0.9 % IV SOLN
545.0000 mg | Freq: Once | INTRAVENOUS | Status: AC
  Administered 2017-04-19: 550 mg via INTRAVENOUS
  Filled 2017-04-19: qty 55

## 2017-04-19 MED ORDER — HEPARIN SOD (PORK) LOCK FLUSH 100 UNIT/ML IV SOLN
500.0000 [IU] | Freq: Once | INTRAVENOUS | Status: AC
  Administered 2017-04-19: 500 [IU] via INTRAVENOUS
  Filled 2017-04-19: qty 5

## 2017-04-19 NOTE — Progress Notes (Signed)
Here for follow up. Pt stated she took one week off chemo-feels good she stated

## 2017-04-20 ENCOUNTER — Inpatient Hospital Stay: Payer: Medicare Other

## 2017-04-20 VITALS — BP 120/78 | HR 86 | Temp 98.6°F | Resp 18

## 2017-04-20 DIAGNOSIS — Z7689 Persons encountering health services in other specified circumstances: Secondary | ICD-10-CM | POA: Diagnosis not present

## 2017-04-20 DIAGNOSIS — Z5112 Encounter for antineoplastic immunotherapy: Secondary | ICD-10-CM | POA: Diagnosis not present

## 2017-04-20 DIAGNOSIS — C3490 Malignant neoplasm of unspecified part of unspecified bronchus or lung: Secondary | ICD-10-CM

## 2017-04-20 DIAGNOSIS — C7951 Secondary malignant neoplasm of bone: Secondary | ICD-10-CM | POA: Diagnosis not present

## 2017-04-20 DIAGNOSIS — C787 Secondary malignant neoplasm of liver and intrahepatic bile duct: Secondary | ICD-10-CM | POA: Diagnosis not present

## 2017-04-20 DIAGNOSIS — Z5111 Encounter for antineoplastic chemotherapy: Secondary | ICD-10-CM | POA: Diagnosis not present

## 2017-04-20 DIAGNOSIS — C3491 Malignant neoplasm of unspecified part of right bronchus or lung: Secondary | ICD-10-CM | POA: Diagnosis not present

## 2017-04-20 LAB — THYROID PANEL WITH TSH
Free Thyroxine Index: 2.2 (ref 1.2–4.9)
T3 Uptake Ratio: 31 % (ref 24–39)
T4, Total: 7 ug/dL (ref 4.5–12.0)
TSH: 3.61 u[IU]/mL (ref 0.450–4.500)

## 2017-04-20 MED ORDER — HEPARIN SOD (PORK) LOCK FLUSH 100 UNIT/ML IV SOLN
500.0000 [IU] | Freq: Once | INTRAVENOUS | Status: AC | PRN
  Administered 2017-04-20: 500 [IU]
  Filled 2017-04-20: qty 5

## 2017-04-20 MED ORDER — SODIUM CHLORIDE 0.9 % IV SOLN
200.0000 mg | Freq: Once | INTRAVENOUS | Status: AC
  Administered 2017-04-20: 200 mg via INTRAVENOUS
  Filled 2017-04-20: qty 10

## 2017-04-20 MED ORDER — SODIUM CHLORIDE 0.9 % IV SOLN: 10.0000 mg | Freq: Once | INTRAVENOUS | Status: DC

## 2017-04-20 MED ORDER — SODIUM CHLORIDE 0.9 % IV SOLN
Freq: Once | INTRAVENOUS | Status: AC
  Administered 2017-04-20: 09:00:00 via INTRAVENOUS
  Filled 2017-04-20: qty 1000

## 2017-04-20 MED ORDER — DEXAMETHASONE SODIUM PHOSPHATE 10 MG/ML IJ SOLN
10.0000 mg | Freq: Once | INTRAMUSCULAR | Status: AC
  Administered 2017-04-20: 10 mg via INTRAVENOUS
  Filled 2017-04-20: qty 1

## 2017-04-20 MED ORDER — SODIUM CHLORIDE 0.9% FLUSH
10.0000 mL | INTRAVENOUS | Status: DC | PRN
  Filled 2017-04-20: qty 10

## 2017-04-21 ENCOUNTER — Inpatient Hospital Stay: Payer: Medicare Other

## 2017-04-21 VITALS — BP 121/81 | HR 76 | Temp 97.0°F | Resp 18 | Wt 211.0 lb

## 2017-04-21 DIAGNOSIS — Z5112 Encounter for antineoplastic immunotherapy: Secondary | ICD-10-CM | POA: Diagnosis not present

## 2017-04-21 DIAGNOSIS — Z5111 Encounter for antineoplastic chemotherapy: Secondary | ICD-10-CM | POA: Diagnosis not present

## 2017-04-21 DIAGNOSIS — Z7689 Persons encountering health services in other specified circumstances: Secondary | ICD-10-CM | POA: Diagnosis not present

## 2017-04-21 DIAGNOSIS — C7951 Secondary malignant neoplasm of bone: Secondary | ICD-10-CM | POA: Diagnosis not present

## 2017-04-21 DIAGNOSIS — C3490 Malignant neoplasm of unspecified part of unspecified bronchus or lung: Secondary | ICD-10-CM

## 2017-04-21 DIAGNOSIS — C787 Secondary malignant neoplasm of liver and intrahepatic bile duct: Secondary | ICD-10-CM | POA: Diagnosis not present

## 2017-04-21 DIAGNOSIS — C3491 Malignant neoplasm of unspecified part of right bronchus or lung: Secondary | ICD-10-CM | POA: Diagnosis not present

## 2017-04-21 MED ORDER — PEGFILGRASTIM 6 MG/0.6ML ~~LOC~~ PSKT
6.0000 mg | PREFILLED_SYRINGE | Freq: Once | SUBCUTANEOUS | Status: AC
  Administered 2017-04-21: 6 mg via SUBCUTANEOUS
  Filled 2017-04-21: qty 0.6

## 2017-04-21 MED ORDER — SODIUM CHLORIDE 0.9 % IV SOLN
200.0000 mg | Freq: Once | INTRAVENOUS | Status: AC
  Administered 2017-04-21: 200 mg via INTRAVENOUS
  Filled 2017-04-21: qty 10

## 2017-04-21 MED ORDER — HEPARIN SOD (PORK) LOCK FLUSH 100 UNIT/ML IV SOLN
500.0000 [IU] | Freq: Once | INTRAVENOUS | Status: AC | PRN
  Administered 2017-04-21: 500 [IU]
  Filled 2017-04-21: qty 5

## 2017-04-21 MED ORDER — DEXAMETHASONE SODIUM PHOSPHATE 10 MG/ML IJ SOLN
10.0000 mg | Freq: Once | INTRAMUSCULAR | Status: AC
  Administered 2017-04-21: 10 mg via INTRAVENOUS
  Filled 2017-04-21: qty 1

## 2017-04-21 MED ORDER — SODIUM CHLORIDE 0.9 % IV SOLN
Freq: Once | INTRAVENOUS | Status: AC
  Administered 2017-04-21: 09:00:00 via INTRAVENOUS
  Filled 2017-04-21: qty 1000

## 2017-04-26 DIAGNOSIS — C7951 Secondary malignant neoplasm of bone: Secondary | ICD-10-CM | POA: Diagnosis not present

## 2017-04-26 DIAGNOSIS — C787 Secondary malignant neoplasm of liver and intrahepatic bile duct: Secondary | ICD-10-CM | POA: Diagnosis not present

## 2017-04-26 DIAGNOSIS — J441 Chronic obstructive pulmonary disease with (acute) exacerbation: Secondary | ICD-10-CM | POA: Diagnosis not present

## 2017-04-26 DIAGNOSIS — D6481 Anemia due to antineoplastic chemotherapy: Secondary | ICD-10-CM | POA: Diagnosis not present

## 2017-04-26 DIAGNOSIS — T451X5D Adverse effect of antineoplastic and immunosuppressive drugs, subsequent encounter: Secondary | ICD-10-CM | POA: Diagnosis not present

## 2017-04-26 DIAGNOSIS — C3490 Malignant neoplasm of unspecified part of unspecified bronchus or lung: Secondary | ICD-10-CM | POA: Diagnosis not present

## 2017-04-29 ENCOUNTER — Other Ambulatory Visit: Payer: Self-pay | Admitting: Oncology

## 2017-05-04 DIAGNOSIS — D6481 Anemia due to antineoplastic chemotherapy: Secondary | ICD-10-CM | POA: Diagnosis not present

## 2017-05-04 DIAGNOSIS — C7951 Secondary malignant neoplasm of bone: Secondary | ICD-10-CM | POA: Diagnosis not present

## 2017-05-04 DIAGNOSIS — C3490 Malignant neoplasm of unspecified part of unspecified bronchus or lung: Secondary | ICD-10-CM | POA: Diagnosis not present

## 2017-05-04 DIAGNOSIS — J441 Chronic obstructive pulmonary disease with (acute) exacerbation: Secondary | ICD-10-CM | POA: Diagnosis not present

## 2017-05-04 DIAGNOSIS — T451X5D Adverse effect of antineoplastic and immunosuppressive drugs, subsequent encounter: Secondary | ICD-10-CM | POA: Diagnosis not present

## 2017-05-04 DIAGNOSIS — C787 Secondary malignant neoplasm of liver and intrahepatic bile duct: Secondary | ICD-10-CM | POA: Diagnosis not present

## 2017-05-07 NOTE — Progress Notes (Signed)
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  INTERVAL HISTORY: Patient returns to clinic today for further evaluation and consideration of cycle 5, day 1 of carboplatinum, etoposide, and Tecentriq.  She continues to have chronic pain, but otherwise feels well.  She has chronic weakness and fatigue.  She has no neurologic complaints.  She denies any recent fevers or illnesses. She has a good appetite and denies weight loss.  She has chronic shortness of breath, but denies any chest pain or hemoptysis.  She denies any nausea, vomiting, or constipation.  She has no abdominal pain.  She denies any melena or hematochezia.  She has no urinary complaints.  Patient offers no further specific complaints today.  REVIEW OF SYSTEMS:   Review of Systems  Constitutional: Positive for malaise/fatigue. Negative for fever and weight loss.  Respiratory: Positive for cough and shortness of breath. Negative for hemoptysis and wheezing.   Cardiovascular: Negative.  Negative for chest pain and leg swelling.  Gastrointestinal: Positive for diarrhea. Negative for abdominal pain, blood in stool and melena.  Genitourinary: Negative.   Musculoskeletal: Positive for back pain, joint pain and neck pain.  Skin: Negative.  Negative for rash.  Neurological: Positive for weakness. Negative for sensory change and focal weakness.  Psychiatric/Behavioral: Negative.  The patient is not nervous/anxious.     As per HPI. Otherwise, a complete review of systems is negative.  PAST MEDICAL HISTORY: Past Medical History:  Diagnosis Date  . Anxiety   . Arthritis   . Asthma    COPD  . Cancer ([place])    Liver  . CAP (community  acquired pneumonia) 06/30/2014  . Cardiomyopathy ([place])    nonischemic (EF 35-40%)  . CHF (congestive heart failure) ([place])   . COPD (chronic obstructive pulmonary disease) ([place])   . Depression   . Dyspnea   . GERD (gastroesophageal reflux disease)   . Hyperlipidemia   . Hypertension   . Pneumonia   . PVC's (premature ventricular contractions)    states 10 years ago  . Sepsis ([place]) 06/30/2014  . SOB (shortness of breath) 05/08/2014  . Spinal cord injury at T7-T12 level [facility])    2016    PAST SURGICAL HISTORY: Past Surgical History:  Procedure Laterality Date  . APPENDECTOMY    . CARDIAC CATHETERIZATION    . CATARACT EXTRACTION     right eye   . CESAREAN SECTION    . COLONOSCOPY    . LAMINOTOMY    . LUMBAR LAMINECTOMY/DECOMPRESSION MICRODISCECTOMY Left 08/01/2016   Procedure: Left Lumbar Four-Five Laminectomy and Foraminotomy;  Surgeon: Earnie Larsson, MD;  Location: [place];  Service: Neurosurgery;  Laterality: Left;  . PORTA CATH INSERTION N/A 02/03/2017   Procedure: PORTA CATH INSERTION;  Surgeon: Katha Cabal, MD;  Location: [place] CV LAB;  Service: Cardiovascular;  Laterality: N/A;    FAMILY HISTORY: Family History  Problem Relation Age of Onset  . Heart attack Father 48       MI x 3     ADVANCED DIRECTIVES (Y/N):  N  HEALTH MAINTENANCE: Social History   Tobacco Use  . Smoking status: Former Smoker    Packs/day: 0.50    Years: 50.00    Pack years: 25.00  Types: Cigarettes    Last attempt to quit: 01/27/2015    Years since quitting: 2.2  . Smokeless tobacco: Never Used  Substance Use Topics  . Alcohol use: No  . Drug use: No     Colonoscopy:  PAP:  Bone density:  Lipid panel:  Allergies  Allergen Reactions  . Iodine Anaphylaxis    IVP dye - has been premedicated previously without incidence. See 01/29/17 CT notes.  . Fentanyl     Overly sedated and groggy, ineffective     Current Outpatient Medications  Medication Sig Dispense Refill    . ADVAIR DISKUS 250-50 MCG/DOSE AEPB Inhale 1 puff into the lungs 2 (two) times daily.    Marland Kitchen albuterol (PROAIR HFA) 108 (90 Base) MCG/ACT inhaler Inhale 2 puffs into the lungs every 6 (six) hours as needed for wheezing or shortness of breath.     Marland Kitchen alendronate (FOSAMAX) 70 MG tablet Take 70 mg by mouth every Thursday.     . ARIPiprazole (ABILIFY) 5 MG tablet Take 5 mg by mouth daily.    . benzonatate (TESSALON) 100 MG capsule Take 1 capsule (100 mg total) by mouth every 8 (eight) hours as needed for cough. 20 capsule 0  . Calcium Citrate-Vitamin D (CALCIUM + D PO) Take 1 tablet by mouth 2 (two) times daily.    . diphenoxylate-atropine (LOMOTIL) 2.5-0.025 MG tablet Take 1 tablet by mouth 4 (four) times daily as needed for diarrhea or loose stools. 30 tablet 0  . DULoxetine (CYMBALTA) 60 MG capsule Take 60 mg by mouth daily.    Marland Kitchen ipratropium-albuterol (DUONEB) 0.5-2.5 (3) MG/3ML SOLN Take 3 mLs by nebulization every 4 (four) hours as needed. 360 mL 1  . lidocaine-prilocaine (EMLA) cream Apply to affected area once 30 g 3  . LORazepam (ATIVAN) 0.5 MG tablet Take 0.5 mg by mouth as needed.    . metoprolol succinate (TOPROL-XL) 100 MG 24 hr tablet TAKE ONE TABLET BY MOUTH ONCE DAILY 30 tablet 0  . oxyCODONE-acetaminophen (PERCOCET/ROXICET) 5-325 MG tablet Take 1-2 tablets by mouth every 6 (six) hours as needed. For pain. 120 tablet 0  . prochlorperazine (COMPAZINE) 10 MG tablet Take 1 tablet (10 mg total) by mouth every 6 (six) hours as needed (Nausea or vomiting). 60 tablet 2  . sacubitril-valsartan (ENTRESTO) 49-51 MG Take 1 tablet by mouth 2 (two) times daily. (Patient taking differently: Take 1 tablet by mouth daily. ) 60 tablet 6  . potassium chloride SA (K-DUR,KLOR-CON) 20 MEQ tablet Take 1 tablet (20 mEq total) by mouth daily. 30 tablet 2   No current facility-administered medications for this visit.    Facility-Administered Medications Ordered in Other Visits  Medication Dose Route Frequency  Provider Last Rate Last Dose  . etoposide (VEPESID) 200 mg in sodium chloride 0.9 % 500 mL chemo infusion  200 mg Intravenous Once Lloyd Huger, MD      . sodium chloride flush (NS) 0.9 % injection 10 mL  10 mL Intravenous PRN Lloyd Huger, MD   10 mL at 03/01/17 0841    OBJECTIVE: Vitals:   05/10/17 0851 05/10/17 0853  BP: 116/73   Pulse: (!) 118   Temp: 97.9 F (36.6 C)   SpO2:  96%     There is no height or weight on file to calculate BMI.    ECOG FS:1 - Symptomatic but completely ambulatory  General: No acute distress.  Sitting in a wheelchair. Eyes: Pink conjunctiva, anicteric sclera. Lungs: Diminished breath sounds bilaterally,  scattered wheezing. Heart: Regular rate and rhythm. No rubs, murmurs, or gallops. Abdomen: Soft, nontender, nondistended. No organomegaly noted, normoactive bowel sounds. Musculoskeletal: No edema, cyanosis, or clubbing. Neuro: Alert, answering all questions appropriately. Cranial nerves grossly intact. Skin: No rashes or petechiae noted. Psych: Normal affect.   LAB RESULTS:  Lab Results  Component Value Date   NA 139 05/10/2017   K 3.0 (L) 05/10/2017   CL 106 05/10/2017   CO2 24 05/10/2017   GLUCOSE 137 (H) 05/10/2017   BUN 12 05/10/2017   CREATININE 0.70 05/10/2017   CALCIUM 8.3 (L) 05/10/2017   PROT 7.4 05/10/2017   ALBUMIN 3.8 05/10/2017   AST 33 05/10/2017   ALT 32 05/10/2017   ALKPHOS 80 05/10/2017   BILITOT 0.5 05/10/2017   GFRNONAA >60 05/10/2017   GFRAA >60 05/10/2017    Lab Results  Component Value Date   WBC 9.6 05/10/2017   NEUTROABS 6.6 (H) 05/10/2017   HGB 9.8 (L) 05/10/2017   HCT 28.7 (L) 05/10/2017   MCV 98.2 05/10/2017   PLT 255 05/10/2017     STUDIES: No results found.  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  PLAN:  1. 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  Patient expressed understanding that she has limited options therefore will continue treatment as planned.  MRI of the brain on February 04, 2017 did not reveal any metastatic disease.  Proceed with cycle 5, day 1 of carboplatinum, Tecentriq, and etoposide on day 1 and etoposide only on days 2 and 3.  Because patient has possible new disease, will continue treatment for 2 additional cycles prior to continuing with Tecentriq maintenance treatment every 3 weeks.  Patient will also receive Zometa today.  Return to clinic in 3 weeks for further evaluation and consideration of cycle 6, day 1. 2.  Elevated liver enzymes: Resolved. 3.  Pain: Patient states long-acting narcotics are too expensive and she only takes Percocet.  4.  Anemia: Decreased, but stable.  Secondary to chemotherapy, monitor. 5.  COPD: Continue outpatient palliative care.  6.  Weakness and fatigue: Continue rehab as indicated. 7.  Disposition: Hospice and end-of-life care were previously discussed, but patient does not wish to pursue this option at this time.  She confirms that she is a DNR and is having outpatient palliative care visit. 8.  Diarrhea: Continue Lomotil as needed. 9.  Hypokalemia: Patient reports she discontinued her oral supplementation.  She is agreed to reinitiate treatment.  She will also receive IV potassium when she returns to clinic tomorrow for treatment.  Patient expressed understanding and was in agreement with this plan. She also understands that She can call clinic at any time with any questions, concerns, or complaints.   Cancer Staging Small cell lung cancer [facility]) Staging form: Lung, AJCC 8th Edition - Clinical stage from 01/28/2017: Stage IV (cT4, cN3, pM1c) - Signed by Lloyd Huger, MD on 01/28/2017   Lloyd Huger, MD   05/13/2017 10:09 AM

## 2017-05-08 ENCOUNTER — Other Ambulatory Visit: Payer: Self-pay | Admitting: Oncology

## 2017-05-08 ENCOUNTER — Other Ambulatory Visit: Payer: Self-pay | Admitting: *Deleted

## 2017-05-08 ENCOUNTER — Telehealth: Payer: Self-pay | Admitting: *Deleted

## 2017-05-08 MED ORDER — OXYCODONE-ACETAMINOPHEN 5-325 MG PO TABS: 1.0000 | ORAL_TABLET | Freq: Four times a day (QID) | ORAL | 0 refills | Status: DC | PRN

## 2017-05-08 NOTE — Telephone Encounter (Signed)
Per pain clinic she does not have a pain contract and they have not been prescribing narcotics for her. Grace [name] how do youy want to handle the pain contract for [place]?

## 2017-05-08 NOTE — Telephone Encounter (Signed)
We'll have her sign one next time she is here.

## 2017-05-08 NOTE — Telephone Encounter (Signed)
Patient requests refill of her Pain medicine. We have never filled this for her and per office note she has a pain clinic doctor she sees for chronic pain. Please advise

## 2017-05-08 NOTE — Telephone Encounter (Signed)
She is here for f/u on Wednesday, will have her sign it then.

## 2017-05-08 NOTE — Telephone Encounter (Signed)
So does she have to wait til Wed to get her prescription?

## 2017-05-08 NOTE — Telephone Encounter (Signed)
I ok with it for her, but need to know if she has a pain contract.  And she will need to sign one with [place].  Thanks!

## 2017-05-08 NOTE — Telephone Encounter (Signed)
No, I have pended rx to be e-scribed.

## 2017-05-09 ENCOUNTER — Telehealth: Payer: Self-pay

## 2017-05-09 NOTE — Telephone Encounter (Signed)
Voicemail received from [name] to return call. Call returned. No answer and voicemail is full. Oncology Nurse Navigator Documentation  Navigator Location: [facility] (05/09/17 0800)   )Navigator Encounter Type: Telephone (05/09/17 0800) Telephone: Grace [name] Call (05/09/17 0800)                                                  Time Spent with Patient: 15 (05/09/17 0800)

## 2017-05-10 ENCOUNTER — Inpatient Hospital Stay: Payer: Medicare Other

## 2017-05-10 ENCOUNTER — Inpatient Hospital Stay (HOSPITAL_BASED_OUTPATIENT_CLINIC_OR_DEPARTMENT_OTHER): Payer: Medicare Other | Admitting: Oncology

## 2017-05-10 ENCOUNTER — Other Ambulatory Visit: Payer: Self-pay

## 2017-05-10 VITALS — BP 116/73 | HR 118 | Temp 97.9°F

## 2017-05-10 DIAGNOSIS — Z5112 Encounter for antineoplastic immunotherapy: Secondary | ICD-10-CM | POA: Diagnosis not present

## 2017-05-10 DIAGNOSIS — C3491 Malignant neoplasm of unspecified part of right bronchus or lung: Secondary | ICD-10-CM

## 2017-05-10 DIAGNOSIS — Z7689 Persons encountering health services in other specified circumstances: Secondary | ICD-10-CM

## 2017-05-10 DIAGNOSIS — Z66 Do not resuscitate: Secondary | ICD-10-CM

## 2017-05-10 DIAGNOSIS — R634 Abnormal weight loss: Secondary | ICD-10-CM

## 2017-05-10 DIAGNOSIS — C787 Secondary malignant neoplasm of liver and intrahepatic bile duct: Secondary | ICD-10-CM

## 2017-05-10 DIAGNOSIS — Z8619 Personal history of other infectious and parasitic diseases: Secondary | ICD-10-CM

## 2017-05-10 DIAGNOSIS — R197 Diarrhea, unspecified: Secondary | ICD-10-CM

## 2017-05-10 DIAGNOSIS — Z79899 Other long term (current) drug therapy: Secondary | ICD-10-CM

## 2017-05-10 DIAGNOSIS — G8929 Other chronic pain: Secondary | ICD-10-CM

## 2017-05-10 DIAGNOSIS — E785 Hyperlipidemia, unspecified: Secondary | ICD-10-CM

## 2017-05-10 DIAGNOSIS — I429 Cardiomyopathy, unspecified: Secondary | ICD-10-CM

## 2017-05-10 DIAGNOSIS — J449 Chronic obstructive pulmonary disease, unspecified: Secondary | ICD-10-CM | POA: Diagnosis not present

## 2017-05-10 DIAGNOSIS — C3490 Malignant neoplasm of unspecified part of unspecified bronchus or lung: Secondary | ICD-10-CM

## 2017-05-10 DIAGNOSIS — I509 Heart failure, unspecified: Secondary | ICD-10-CM

## 2017-05-10 DIAGNOSIS — K219 Gastro-esophageal reflux disease without esophagitis: Secondary | ICD-10-CM

## 2017-05-10 DIAGNOSIS — I700 Atherosclerosis of aorta: Secondary | ICD-10-CM

## 2017-05-10 DIAGNOSIS — C7951 Secondary malignant neoplasm of bone: Secondary | ICD-10-CM | POA: Diagnosis not present

## 2017-05-10 DIAGNOSIS — Z5111 Encounter for antineoplastic chemotherapy: Secondary | ICD-10-CM

## 2017-05-10 DIAGNOSIS — F418 Other specified anxiety disorders: Secondary | ICD-10-CM | POA: Diagnosis not present

## 2017-05-10 DIAGNOSIS — Z87891 Personal history of nicotine dependence: Secondary | ICD-10-CM

## 2017-05-10 LAB — CBC WITH DIFFERENTIAL/PLATELET
BASOS ABS: 0.1 10*3/uL (ref 0–0.1)
Basophils Relative: 1 %
EOS PCT: 0 %
Eosinophils Absolute: 0 10*3/uL (ref 0–0.7)
HEMATOCRIT: 28.7 % — AB (ref 35.0–47.0)
Hemoglobin: 9.8 g/dL — ABNORMAL LOW (ref 12.0–16.0)
LYMPHS ABS: 2.1 10*3/uL (ref 1.0–3.6)
LYMPHS PCT: 22 %
MCH: 33.6 pg (ref 26.0–34.0)
MCHC: 34.2 g/dL (ref 32.0–36.0)
MCV: 98.2 fL (ref 80.0–100.0)
Monocytes Absolute: 0.8 10*3/uL (ref 0.2–0.9)
Monocytes Relative: 9 %
NEUTROS ABS: 6.6 10*3/uL — AB (ref 1.4–6.5)
Neutrophils Relative %: 68 %
Platelets: 255 10*3/uL (ref 150–440)
RBC: 2.92 MIL/uL — AB (ref 3.80–5.20)
RDW: 20.8 % — ABNORMAL HIGH (ref 11.5–14.5)
WBC: 9.6 10*3/uL (ref 3.6–11.0)

## 2017-05-10 LAB — COMPREHENSIVE METABOLIC PANEL
ALBUMIN: 3.8 g/dL (ref 3.5–5.0)
ALT: 32 U/L (ref 14–54)
ANION GAP: 9 (ref 5–15)
AST: 33 U/L (ref 15–41)
Alkaline Phosphatase: 80 U/L (ref 38–126)
BILIRUBIN TOTAL: 0.5 mg/dL (ref 0.3–1.2)
BUN: 12 mg/dL (ref 6–20)
CHLORIDE: 106 mmol/L (ref 101–111)
CO2: 24 mmol/L (ref 22–32)
Calcium: 8.3 mg/dL — ABNORMAL LOW (ref 8.9–10.3)
Creatinine, Ser: 0.7 mg/dL (ref 0.44–1.00)
GFR calc Af Amer: >60 mL/min (ref >60)
GFR calc non Af Amer: >60 mL/min (ref >60)
GLUCOSE: 137 mg/dL — AB (ref 65–99)
POTASSIUM: 3 mmol/L — AB (ref 3.5–5.1)
SODIUM: 139 mmol/L (ref 135–145)
Total Protein: 7.4 g/dL (ref 6.5–8.1)

## 2017-05-10 MED ORDER — DEXAMETHASONE SODIUM PHOSPHATE 10 MG/ML IJ SOLN
10.0000 mg | Freq: Once | INTRAMUSCULAR | Status: AC
  Administered 2017-05-10: 10 mg via INTRAVENOUS
  Filled 2017-05-10: qty 1

## 2017-05-10 MED ORDER — SODIUM CHLORIDE 0.9 % IV SOLN
Freq: Once | INTRAVENOUS | Status: AC
  Administered 2017-05-10: 10:00:00 via INTRAVENOUS
  Filled 2017-05-10: qty 1000

## 2017-05-10 MED ORDER — SODIUM CHLORIDE 0.9% FLUSH
10.0000 mL | INTRAVENOUS | Status: DC | PRN
  Administered 2017-05-10: 10 mL via INTRAVENOUS
  Filled 2017-05-10: qty 10

## 2017-05-10 MED ORDER — SODIUM CHLORIDE 0.9 % IV SOLN
200.0000 mg | Freq: Once | INTRAVENOUS | Status: AC
  Administered 2017-05-10: 200 mg via INTRAVENOUS
  Filled 2017-05-10: qty 10

## 2017-05-10 MED ORDER — SODIUM CHLORIDE 0.9 % IV SOLN
545.0000 mg | Freq: Once | INTRAVENOUS | Status: AC
  Administered 2017-05-10: 550 mg via INTRAVENOUS
  Filled 2017-05-10: qty 55

## 2017-05-10 MED ORDER — HEPARIN SOD (PORK) LOCK FLUSH 100 UNIT/ML IV SOLN: 500.0000 [IU] | Freq: Once | INTRAVENOUS | Status: DC | PRN

## 2017-05-10 MED ORDER — HEPARIN SOD (PORK) LOCK FLUSH 100 UNIT/ML IV SOLN
500.0000 [IU] | Freq: Once | INTRAVENOUS | Status: AC
  Administered 2017-05-10: 500 [IU] via INTRAVENOUS
  Filled 2017-05-10: qty 5

## 2017-05-10 MED ORDER — PALONOSETRON HCL INJECTION 0.25 MG/5ML
0.2500 mg | Freq: Once | INTRAVENOUS | Status: AC
  Administered 2017-05-10: 0.25 mg via INTRAVENOUS
  Filled 2017-05-10: qty 5

## 2017-05-10 MED ORDER — POTASSIUM CHLORIDE CRYS ER 20 MEQ PO TBCR: 20.0000 meq | EXTENDED_RELEASE_TABLET | Freq: Every day | ORAL | 2 refills | Status: DC

## 2017-05-10 MED ORDER — SODIUM CHLORIDE 0.9 % IV SOLN
1200.0000 mg | Freq: Once | INTRAVENOUS | Status: AC
  Administered 2017-05-10: 1200 mg via INTRAVENOUS
  Filled 2017-05-10: qty 20

## 2017-05-11 ENCOUNTER — Inpatient Hospital Stay: Payer: Medicare Other

## 2017-05-11 VITALS — BP 107/70 | HR 81 | Temp 96.7°F | Resp 20

## 2017-05-11 DIAGNOSIS — E871 Hypo-osmolality and hyponatremia: Secondary | ICD-10-CM

## 2017-05-11 DIAGNOSIS — Z5111 Encounter for antineoplastic chemotherapy: Secondary | ICD-10-CM | POA: Diagnosis not present

## 2017-05-11 DIAGNOSIS — C3490 Malignant neoplasm of unspecified part of unspecified bronchus or lung: Secondary | ICD-10-CM

## 2017-05-11 DIAGNOSIS — C7951 Secondary malignant neoplasm of bone: Secondary | ICD-10-CM | POA: Diagnosis not present

## 2017-05-11 DIAGNOSIS — C787 Secondary malignant neoplasm of liver and intrahepatic bile duct: Secondary | ICD-10-CM | POA: Diagnosis not present

## 2017-05-11 DIAGNOSIS — E876 Hypokalemia: Secondary | ICD-10-CM

## 2017-05-11 DIAGNOSIS — Z5112 Encounter for antineoplastic immunotherapy: Secondary | ICD-10-CM | POA: Diagnosis not present

## 2017-05-11 DIAGNOSIS — Z7689 Persons encountering health services in other specified circumstances: Secondary | ICD-10-CM | POA: Diagnosis not present

## 2017-05-11 DIAGNOSIS — C3491 Malignant neoplasm of unspecified part of right bronchus or lung: Secondary | ICD-10-CM | POA: Diagnosis not present

## 2017-05-11 MED ORDER — SODIUM CHLORIDE 0.9 % IV SOLN
200.0000 mg | Freq: Once | INTRAVENOUS | Status: AC
  Administered 2017-05-11: 200 mg via INTRAVENOUS
  Filled 2017-05-11: qty 10

## 2017-05-11 MED ORDER — DEXAMETHASONE SODIUM PHOSPHATE 10 MG/ML IJ SOLN
10.0000 mg | Freq: Once | INTRAMUSCULAR | Status: AC
  Administered 2017-05-11: 10 mg via INTRAVENOUS
  Filled 2017-05-11: qty 1

## 2017-05-11 MED ORDER — SODIUM CHLORIDE 0.9% FLUSH
10.0000 mL | INTRAVENOUS | Status: DC | PRN
  Administered 2017-05-11: 10 mL
  Filled 2017-05-11: qty 10

## 2017-05-11 MED ORDER — SODIUM CHLORIDE 0.9 % IV SOLN
Freq: Once | INTRAVENOUS | Status: AC
Start: 2017-05-11 — End: 2017-05-11
  Administered 2017-05-11: 09:00:00 via INTRAVENOUS
  Filled 2017-05-11: qty 1000

## 2017-05-11 MED ORDER — POTASSIUM CHLORIDE 20 MEQ/100ML IV SOLN
20.0000 meq | Freq: Once | INTRAVENOUS | Status: AC
  Administered 2017-05-11: 20 meq via INTRAVENOUS

## 2017-05-11 MED ORDER — HEPARIN SOD (PORK) LOCK FLUSH 100 UNIT/ML IV SOLN
500.0000 [IU] | Freq: Once | INTRAVENOUS | Status: AC | PRN
  Administered 2017-05-11: 500 [IU]
  Filled 2017-05-11: qty 5

## 2017-05-11 MED ORDER — SODIUM CHLORIDE 0.9 % IV SOLN: Freq: Once | INTRAVENOUS | Status: DC

## 2017-05-11 NOTE — Progress Notes (Signed)
Per MD wants to give 68meq KCl over 1 hour

## 2017-05-12 ENCOUNTER — Encounter: Payer: Self-pay | Admitting: *Deleted

## 2017-05-12 ENCOUNTER — Inpatient Hospital Stay: Payer: Medicare Other

## 2017-05-12 MED ORDER — SODIUM CHLORIDE 0.9 % IV SOLN
200.0000 mg | Freq: Once | INTRAVENOUS | Status: DC
  Filled 2017-05-12: qty 10

## 2017-05-23 DIAGNOSIS — M9903 Segmental and somatic dysfunction of lumbar region: Secondary | ICD-10-CM | POA: Diagnosis not present

## 2017-05-23 DIAGNOSIS — G252 Other specified forms of tremor: Secondary | ICD-10-CM | POA: Diagnosis not present

## 2017-05-23 DIAGNOSIS — K219 Gastro-esophageal reflux disease without esophagitis: Secondary | ICD-10-CM | POA: Diagnosis not present

## 2017-05-23 DIAGNOSIS — M9901 Segmental and somatic dysfunction of cervical region: Secondary | ICD-10-CM | POA: Diagnosis not present

## 2017-05-23 DIAGNOSIS — M5417 Radiculopathy, lumbosacral region: Secondary | ICD-10-CM | POA: Diagnosis not present

## 2017-05-23 DIAGNOSIS — M50122 Cervical disc disorder at C5-C6 level with radiculopathy: Secondary | ICD-10-CM | POA: Diagnosis not present

## 2017-05-23 DIAGNOSIS — R110 Nausea: Secondary | ICD-10-CM | POA: Diagnosis not present

## 2017-05-23 DIAGNOSIS — E119 Type 2 diabetes mellitus without complications: Secondary | ICD-10-CM | POA: Diagnosis not present

## 2017-05-28 ENCOUNTER — Other Ambulatory Visit: Payer: Self-pay | Admitting: Cardiovascular Disease

## 2017-05-28 NOTE — Progress Notes (Signed)
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  INTERVAL HISTORY: Patient returns to clinic today for further evaluation and consideration of cycle 6, day 1 of carboplatinum, etoposide, and Tecentriq.  She continues to have chronic weakness and fatigue which is unchanged.  She continues to have pain, but this is well controlled on her current narcotic regimen. She has no neurologic complaints.  She denies any recent fevers or illnesses.  She has a fair appetite and denies weight loss.  She has chronic shortness of breath, but denies any chest pain or hemoptysis.  No nausea, vomiting, constipation, or diarrhea.  She has no abdominal pain.  She denies any melena or hematochezia.  She has no urinary complaints.  Patient offers no further specific complaints today.  REVIEW OF SYSTEMS:   Review of Systems  Constitutional: Positive for malaise/fatigue. Negative for fever and weight loss.  Respiratory: Positive for cough and shortness of breath. Negative for hemoptysis and wheezing.   Cardiovascular: Negative.  Negative for chest pain and leg swelling.  Gastrointestinal: Negative.  Negative for abdominal pain, blood in stool, diarrhea and melena.  Genitourinary: Negative.   Musculoskeletal: Positive for back pain, joint pain and neck pain.  Skin: Negative.  Negative for rash.  Neurological: Positive for weakness. Negative for sensory change and focal weakness.  Psychiatric/Behavioral: Negative.  The patient is not nervous/anxious.     As per HPI. Otherwise, a complete review of systems is negative.  PAST MEDICAL HISTORY: Past Medical History:  Diagnosis Date  . Anxiety   . Arthritis   . Asthma    COPD  . Cancer ([place])    Liver  . CAP (community acquired pneumonia) 06/30/2014  . Cardiomyopathy ([place])    nonischemic (EF 35-40%)  . CHF (congestive heart failure) ([place])   . COPD (chronic obstructive pulmonary disease) ([place])   . Depression   . Dyspnea   . GERD (gastroesophageal reflux disease)   . Hyperlipidemia   . Hypertension   . Pneumonia   . PVC's (premature ventricular contractions)    states 10 years ago  . Sepsis ([place]) 06/30/2014  . SOB (shortness of breath) 05/08/2014  . Spinal cord injury at T7-T12 level [facility])    2016    PAST SURGICAL HISTORY: Past Surgical History:  Procedure Laterality Date  . APPENDECTOMY    . CARDIAC CATHETERIZATION    . CATARACT EXTRACTION     right eye   . CESAREAN SECTION    . COLONOSCOPY    . LAMINOTOMY    . LUMBAR LAMINECTOMY/DECOMPRESSION MICRODISCECTOMY Left 08/01/2016   Procedure: Left Lumbar Four-Five Laminectomy and Foraminotomy;  Surgeon: Earnie Larsson, MD;  Location: [place];  Service: Neurosurgery;  Laterality: Left;  . PORTA CATH INSERTION N/A 02/03/2017   Procedure: PORTA CATH INSERTION;  Surgeon: Katha Cabal, MD;  Location: [place] CV LAB;  Service: Cardiovascular;  Laterality: N/A;    FAMILY HISTORY: Family History  Problem Relation Age of Onset  . Heart attack Father 10       MI x 3     ADVANCED DIRECTIVES (Y/N):  N  HEALTH MAINTENANCE: Social History   Tobacco Use  . Smoking status: Former Smoker    Packs/day: 0.50  Years: 50.00    Pack years: 25.00    Types: Cigarettes    Last attempt to quit: 01/27/2015    Years since quitting: 2.3  . Smokeless tobacco: Never Used  Substance Use Topics  . Alcohol use: No  . Drug use: No     Colonoscopy:  PAP:  Bone density:  Lipid panel:  Allergies  Allergen Reactions  . Iodine Anaphylaxis    IVP dye - has been premedicated previously without incidence. See 01/29/17 CT notes.  . Fentanyl     Overly sedated and groggy, ineffective     Current  Outpatient Medications  Medication Sig Dispense Refill  . ADVAIR DISKUS 250-50 MCG/DOSE AEPB Inhale 1 puff into the lungs 2 (two) times daily.    Marland Kitchen albuterol (PROAIR HFA) 108 (90 Base) MCG/ACT inhaler Inhale 2 puffs into the lungs every 6 (six) hours as needed for wheezing or shortness of breath.     Marland Kitchen alendronate (FOSAMAX) 70 MG tablet Take 70 mg by mouth every Thursday.     . ARIPiprazole (ABILIFY) 5 MG tablet Take 5 mg by mouth daily.    . benzonatate (TESSALON) 100 MG capsule Take 1 capsule (100 mg total) by mouth every 8 (eight) hours as needed for cough. 20 capsule 0  . Calcium Citrate-Vitamin D (CALCIUM + D PO) Take 1 tablet by mouth 2 (two) times daily.    . diphenoxylate-atropine (LOMOTIL) 2.5-0.025 MG tablet Take 1 tablet by mouth 4 (four) times daily as needed for diarrhea or loose stools. 30 tablet 0  . DULoxetine (CYMBALTA) 60 MG capsule Take 60 mg by mouth daily.    Marland Kitchen ipratropium-albuterol (DUONEB) 0.5-2.5 (3) MG/3ML SOLN Take 3 mLs by nebulization every 4 (four) hours as needed. 360 mL 1  . lidocaine-prilocaine (EMLA) cream Apply to affected area once 30 g 3  . LORazepam (ATIVAN) 0.5 MG tablet Take 0.5 mg by mouth as needed.    . metoprolol succinate (TOPROL-XL) 100 MG 24 hr tablet TAKE ONE TABLET BY MOUTH ONCE DAILY 30 tablet 0  . oxyCODONE-acetaminophen (PERCOCET/ROXICET) 5-325 MG tablet Take 1-2 tablets by mouth every 6 (six) hours as needed. For pain. 120 tablet 0  . pantoprazole (PROTONIX) 40 MG tablet Take 1 tablet by mouth daily.    . potassium chloride SA (K-DUR,KLOR-CON) 20 MEQ tablet Take 1 tablet (20 mEq total) by mouth daily. 30 tablet 2  . prochlorperazine (COMPAZINE) 10 MG tablet Take 1 tablet (10 mg total) by mouth every 6 (six) hours as needed (Nausea or vomiting). 60 tablet 2  . sacubitril-valsartan (ENTRESTO) 49-51 MG Takes 1 1/2 tablet by mouth twice daily. 45 tablet 5   No current facility-administered medications for this visit.    Facility-Administered  Medications Ordered in Other Visits  Medication Dose Route Frequency Provider Last Rate Last Dose  . etoposide (VEPESID) 200 mg in sodium chloride 0.9 % 500 mL chemo infusion  200 mg Intravenous Once Lloyd Huger, MD      . sodium chloride flush (NS) 0.9 % injection 10 mL  10 mL Intravenous PRN Lloyd Huger, MD   10 mL at 03/01/17 0841  . sodium chloride flush (NS) 0.9 % injection 10 mL  10 mL Intracatheter PRN Lloyd Huger, MD   10 mL at 06/01/17 0933    OBJECTIVE: Vitals:   05/31/17 0830 05/31/17 0837  BP:  133/90  Pulse:  (!) 130  Resp: 16   Temp:  98.3 F (36.8 C)  Body mass index is 34.95 kg/m.    ECOG FS:1 - Symptomatic but completely ambulatory  General: Well-developed, well-nourished, no acute distress.  Sitting in a wheelchair Eyes: Pink conjunctiva, anicteric sclera. Lungs: Diminished breath sounds bilaterally. Heart: Regular rate and rhythm. No rubs, murmurs, or gallops. Abdomen: Soft, nontender, nondistended. No organomegaly noted, normoactive bowel sounds. Musculoskeletal: No edema, cyanosis, or clubbing. Neuro: Alert, answering all questions appropriately. Cranial nerves grossly intact. Skin: No rashes or petechiae noted. Psych: Normal affect.   LAB RESULTS:  Lab Results  Component Value Date   NA 136 05/31/2017   K 3.3 (L) 05/31/2017   CL 106 05/31/2017   CO2 19 (L) 05/31/2017   GLUCOSE 144 (H) 05/31/2017   BUN 23 (H) 05/31/2017   CREATININE 1.02 (H) 05/31/2017   CALCIUM 10.7 (H) 05/31/2017   PROT 7.7 05/31/2017   ALBUMIN 4.2 05/31/2017   AST 28 05/31/2017   ALT 26 05/31/2017   ALKPHOS 58 05/31/2017   BILITOT 0.6 05/31/2017   GFRNONAA 56 (L) 05/31/2017   GFRAA >60 05/31/2017    Lab Results  Component Value Date   WBC 7.1 05/31/2017   NEUTROABS 4.3 05/31/2017   HGB 10.7 (L) 05/31/2017   HCT 30.6 (L) 05/31/2017   MCV 99.7 05/31/2017   PLT 194 05/31/2017     STUDIES: No results found.  ASSESSMENT: Stage IV small cell  lung cancer with liver and bone metastasis.  PLAN:  1. 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  Patient expressed understanding that she has limited options therefore will continue treatment as planned.  MRI of the brain on February 04, 2017 did not reveal any metastatic disease.  Proceed with cycle 6, day 1 of carboplatinum, Tecentriq, and etoposide on day 1 and etoposide only on days 2 and 3.  Patient last received Zometa on April 19, 2017.  Return to clinic in 3 weeks for further evaluation and consideration cycle 7 which will be maintenance Tecentriq. 2. Pain: Patient states long-acting narcotics are too expensive.  Continue Percocet as prescribed.   3.  Anemia: Decreased, but stable.  Secondary to chemotherapy, monitor. 4.  COPD: Continue outpatient palliative care.  Continue current medications as prescribed. 5.  Disposition: Hospice and end-of-life care were previously discussed, but patient does not wish to pursue this option at this time.  She confirms that she is a DNR and is having outpatient palliative care visit. 6.  Diarrhea: Patient does not complain of this today.  Continue Lomotil as needed. 7.  Hypokalemia: Improving.  Continue oral supplementation as prescribed.     Patient expressed understanding and was in agreement with this plan. She also understands that She can call clinic at any time with any questions, concerns, or complaints.   Cancer Staging Small cell lung cancer [facility]) Staging form: Lung, AJCC 8th Edition - Clinical stage from 01/28/2017: Stage IV (cT4, cN3, pM1c) - Signed by Lloyd Huger, MD on 01/28/2017   Lloyd Huger, MD   06/01/2017 12:28 PM

## 2017-05-30 ENCOUNTER — Other Ambulatory Visit: Payer: Self-pay | Admitting: Oncology

## 2017-05-31 ENCOUNTER — Other Ambulatory Visit: Payer: Self-pay

## 2017-05-31 ENCOUNTER — Encounter: Payer: Self-pay | Admitting: Oncology

## 2017-05-31 ENCOUNTER — Inpatient Hospital Stay (HOSPITAL_BASED_OUTPATIENT_CLINIC_OR_DEPARTMENT_OTHER): Payer: Medicare Other | Admitting: Oncology

## 2017-05-31 ENCOUNTER — Inpatient Hospital Stay: Payer: Medicare Other | Attending: Oncology

## 2017-05-31 ENCOUNTER — Inpatient Hospital Stay: Payer: Medicare Other

## 2017-05-31 VITALS — BP 133/90 | HR 130 | Temp 98.3°F | Resp 16 | Ht 65.0 in | Wt 210.0 lb

## 2017-05-31 VITALS — HR 120

## 2017-05-31 DIAGNOSIS — J449 Chronic obstructive pulmonary disease, unspecified: Secondary | ICD-10-CM | POA: Insufficient documentation

## 2017-05-31 DIAGNOSIS — I429 Cardiomyopathy, unspecified: Secondary | ICD-10-CM | POA: Diagnosis not present

## 2017-05-31 DIAGNOSIS — R0602 Shortness of breath: Secondary | ICD-10-CM | POA: Insufficient documentation

## 2017-05-31 DIAGNOSIS — E876 Hypokalemia: Secondary | ICD-10-CM | POA: Insufficient documentation

## 2017-05-31 DIAGNOSIS — M129 Arthropathy, unspecified: Secondary | ICD-10-CM | POA: Diagnosis not present

## 2017-05-31 DIAGNOSIS — Z5111 Encounter for antineoplastic chemotherapy: Secondary | ICD-10-CM

## 2017-05-31 DIAGNOSIS — C7951 Secondary malignant neoplasm of bone: Secondary | ICD-10-CM

## 2017-05-31 DIAGNOSIS — J45909 Unspecified asthma, uncomplicated: Secondary | ICD-10-CM | POA: Insufficient documentation

## 2017-05-31 DIAGNOSIS — C3490 Malignant neoplasm of unspecified part of unspecified bronchus or lung: Secondary | ICD-10-CM | POA: Insufficient documentation

## 2017-05-31 DIAGNOSIS — R5383 Other fatigue: Secondary | ICD-10-CM | POA: Insufficient documentation

## 2017-05-31 DIAGNOSIS — C787 Secondary malignant neoplasm of liver and intrahepatic bile duct: Secondary | ICD-10-CM

## 2017-05-31 DIAGNOSIS — F418 Other specified anxiety disorders: Secondary | ICD-10-CM | POA: Insufficient documentation

## 2017-05-31 DIAGNOSIS — D649 Anemia, unspecified: Secondary | ICD-10-CM

## 2017-05-31 DIAGNOSIS — E785 Hyperlipidemia, unspecified: Secondary | ICD-10-CM

## 2017-05-31 DIAGNOSIS — I509 Heart failure, unspecified: Secondary | ICD-10-CM | POA: Diagnosis not present

## 2017-05-31 DIAGNOSIS — K219 Gastro-esophageal reflux disease without esophagitis: Secondary | ICD-10-CM

## 2017-05-31 DIAGNOSIS — Z87891 Personal history of nicotine dependence: Secondary | ICD-10-CM | POA: Diagnosis not present

## 2017-05-31 DIAGNOSIS — I10 Essential (primary) hypertension: Secondary | ICD-10-CM | POA: Insufficient documentation

## 2017-05-31 DIAGNOSIS — R531 Weakness: Secondary | ICD-10-CM | POA: Diagnosis not present

## 2017-05-31 LAB — CBC WITH DIFFERENTIAL/PLATELET
Basophils Absolute: 0.1 10*3/uL (ref 0–0.1)
Basophils Relative: 1 %
EOS PCT: 1 %
Eosinophils Absolute: 0.1 10*3/uL (ref 0–0.7)
HCT: 30.6 % — ABNORMAL LOW (ref 35.0–47.0)
Hemoglobin: 10.7 g/dL — ABNORMAL LOW (ref 12.0–16.0)
LYMPHS ABS: 1.8 10*3/uL (ref 1.0–3.6)
LYMPHS PCT: 25 %
MCH: 34.9 pg — ABNORMAL HIGH (ref 26.0–34.0)
MCHC: 35 g/dL (ref 32.0–36.0)
MCV: 99.7 fL (ref 80.0–100.0)
MONO ABS: 0.9 10*3/uL (ref 0.2–0.9)
Monocytes Relative: 12 %
Neutro Abs: 4.3 10*3/uL (ref 1.4–6.5)
Neutrophils Relative %: 61 %
PLATELETS: 194 10*3/uL (ref 150–440)
RBC: 3.07 MIL/uL — ABNORMAL LOW (ref 3.80–5.20)
RDW: 19.1 % — AB (ref 11.5–14.5)
WBC: 7.1 10*3/uL (ref 3.6–11.0)

## 2017-05-31 LAB — COMPREHENSIVE METABOLIC PANEL
ALT: 26 U/L (ref 14–54)
AST: 28 U/L (ref 15–41)
Albumin: 4.2 g/dL (ref 3.5–5.0)
Alkaline Phosphatase: 58 U/L (ref 38–126)
Anion gap: 11 (ref 5–15)
BUN: 23 mg/dL — AB (ref 6–20)
CHLORIDE: 106 mmol/L (ref 101–111)
CO2: 19 mmol/L — ABNORMAL LOW (ref 22–32)
Calcium: 10.7 mg/dL — ABNORMAL HIGH (ref 8.9–10.3)
Creatinine, Ser: 1.02 mg/dL — ABNORMAL HIGH (ref 0.44–1.00)
GFR calc Af Amer: >60 mL/min (ref >60)
GFR calc non Af Amer: 56 mL/min — ABNORMAL LOW (ref >60)
GLUCOSE: 144 mg/dL — AB (ref 65–99)
POTASSIUM: 3.3 mmol/L — AB (ref 3.5–5.1)
Sodium: 136 mmol/L (ref 135–145)
Total Bilirubin: 0.6 mg/dL (ref 0.3–1.2)
Total Protein: 7.7 g/dL (ref 6.5–8.1)

## 2017-05-31 MED ORDER — METOPROLOL SUCCINATE ER 100 MG PO TB24: 100.0000 mg | ORAL_TABLET | Freq: Every day | ORAL | Status: DC

## 2017-05-31 MED ORDER — PALONOSETRON HCL INJECTION 0.25 MG/5ML
0.2500 mg | Freq: Once | INTRAVENOUS | Status: AC
  Administered 2017-05-31: 0.25 mg via INTRAVENOUS
  Filled 2017-05-31: qty 5

## 2017-05-31 MED ORDER — ETOPOSIDE CHEMO INJECTION 1 GM/50ML
200.0000 mg | Freq: Once | INTRAVENOUS | Status: AC
  Administered 2017-05-31: 200 mg via INTRAVENOUS
  Filled 2017-05-31: qty 10

## 2017-05-31 MED ORDER — DEXAMETHASONE SODIUM PHOSPHATE 10 MG/ML IJ SOLN
10.0000 mg | Freq: Once | INTRAMUSCULAR | Status: AC
  Administered 2017-05-31: 10 mg via INTRAVENOUS
  Filled 2017-05-31: qty 1

## 2017-05-31 MED ORDER — ATEZOLIZUMAB CHEMO INJECTION 1200 MG/20ML
1200.0000 mg | Freq: Once | INTRAVENOUS | Status: AC
  Administered 2017-05-31: 1200 mg via INTRAVENOUS
  Filled 2017-05-31: qty 20

## 2017-05-31 MED ORDER — CARBOPLATIN CHEMO INJECTION 600 MG/60ML
550.0000 mg | Freq: Once | INTRAVENOUS | Status: AC
  Administered 2017-05-31: 550 mg via INTRAVENOUS
  Filled 2017-05-31: qty 55

## 2017-05-31 MED ORDER — METOPROLOL SUCCINATE 100 MG PO CS24: 100.0000 mg | EXTENDED_RELEASE_CAPSULE | Freq: Once | ORAL | Status: DC

## 2017-05-31 MED ORDER — SODIUM CHLORIDE 0.9% FLUSH
10.0000 mL | Freq: Once | INTRAVENOUS | Status: AC
  Administered 2017-05-31: 10 mL via INTRAVENOUS
  Filled 2017-05-31: qty 10

## 2017-05-31 MED ORDER — HEPARIN SOD (PORK) LOCK FLUSH 100 UNIT/ML IV SOLN
500.0000 [IU] | Freq: Once | INTRAVENOUS | Status: AC
  Administered 2017-05-31: 500 [IU] via INTRAVENOUS
  Filled 2017-05-31: qty 5

## 2017-05-31 MED ORDER — METOPROLOL SUCCINATE ER 50 MG PO TB24
100.0000 mg | ORAL_TABLET | Freq: Every day | ORAL | Status: DC
  Administered 2017-05-31: 100 mg via ORAL
  Filled 2017-05-31: qty 2

## 2017-05-31 MED ORDER — SODIUM CHLORIDE 0.9 % IV SOLN
Freq: Once | INTRAVENOUS | Status: AC
  Administered 2017-05-31: 10:00:00 via INTRAVENOUS
  Filled 2017-05-31: qty 1000

## 2017-05-31 NOTE — Progress Notes (Signed)
Patient receiving treatment today per Dr. Grayland Ormond. Dr. Grayland Ormond ordered Metoprolol Succinate 100 mg, patient forgot to take her Metoprolol at home this a.m.

## 2017-05-31 NOTE — Progress Notes (Signed)
Patient here for follow up

## 2017-06-01 ENCOUNTER — Inpatient Hospital Stay: Payer: Medicare Other

## 2017-06-01 VITALS — BP 92/62 | HR 89 | Temp 98.1°F | Resp 20

## 2017-06-01 DIAGNOSIS — Z5111 Encounter for antineoplastic chemotherapy: Secondary | ICD-10-CM | POA: Diagnosis not present

## 2017-06-01 DIAGNOSIS — C7951 Secondary malignant neoplasm of bone: Secondary | ICD-10-CM | POA: Diagnosis not present

## 2017-06-01 DIAGNOSIS — C787 Secondary malignant neoplasm of liver and intrahepatic bile duct: Secondary | ICD-10-CM | POA: Diagnosis not present

## 2017-06-01 DIAGNOSIS — C3490 Malignant neoplasm of unspecified part of unspecified bronchus or lung: Secondary | ICD-10-CM | POA: Diagnosis not present

## 2017-06-01 DIAGNOSIS — R5383 Other fatigue: Secondary | ICD-10-CM | POA: Diagnosis not present

## 2017-06-01 DIAGNOSIS — D649 Anemia, unspecified: Secondary | ICD-10-CM | POA: Diagnosis not present

## 2017-06-01 LAB — THYROID PANEL WITH TSH
Free Thyroxine Index: 1.8 (ref 1.2–4.9)
T3 Uptake Ratio: 23 % — ABNORMAL LOW (ref 24–39)
T4, Total: 7.9 ug/dL (ref 4.5–12.0)
TSH: 3.79 u[IU]/mL (ref 0.450–4.500)

## 2017-06-01 MED ORDER — DEXAMETHASONE SODIUM PHOSPHATE 10 MG/ML IJ SOLN
10.0000 mg | Freq: Once | INTRAMUSCULAR | Status: AC
  Administered 2017-06-01: 10 mg via INTRAVENOUS
  Filled 2017-06-01: qty 1

## 2017-06-01 MED ORDER — SODIUM CHLORIDE 0.9% FLUSH
10.0000 mL | INTRAVENOUS | Status: DC | PRN
  Administered 2017-06-01: 10 mL
  Filled 2017-06-01: qty 10

## 2017-06-01 MED ORDER — SODIUM CHLORIDE 0.9 % IV SOLN
Freq: Once | INTRAVENOUS | Status: AC
  Administered 2017-06-01: 10:00:00 via INTRAVENOUS
  Filled 2017-06-01: qty 1000

## 2017-06-01 MED ORDER — HEPARIN SOD (PORK) LOCK FLUSH 100 UNIT/ML IV SOLN
500.0000 [IU] | Freq: Once | INTRAVENOUS | Status: AC | PRN
  Administered 2017-06-01: 500 [IU]
  Filled 2017-06-01: qty 5

## 2017-06-01 MED ORDER — ETOPOSIDE CHEMO INJECTION 1 GM/50ML
200.0000 mg | Freq: Once | INTRAVENOUS | Status: AC
  Administered 2017-06-01: 200 mg via INTRAVENOUS
  Filled 2017-06-01: qty 10

## 2017-06-02 ENCOUNTER — Inpatient Hospital Stay: Payer: Medicare Other

## 2017-06-02 VITALS — BP 104/73 | HR 83 | Temp 97.6°F | Resp 18

## 2017-06-02 DIAGNOSIS — C787 Secondary malignant neoplasm of liver and intrahepatic bile duct: Secondary | ICD-10-CM | POA: Diagnosis not present

## 2017-06-02 DIAGNOSIS — R5383 Other fatigue: Secondary | ICD-10-CM | POA: Diagnosis not present

## 2017-06-02 DIAGNOSIS — C7951 Secondary malignant neoplasm of bone: Secondary | ICD-10-CM | POA: Diagnosis not present

## 2017-06-02 DIAGNOSIS — C3490 Malignant neoplasm of unspecified part of unspecified bronchus or lung: Secondary | ICD-10-CM | POA: Diagnosis not present

## 2017-06-02 DIAGNOSIS — D649 Anemia, unspecified: Secondary | ICD-10-CM | POA: Diagnosis not present

## 2017-06-02 DIAGNOSIS — Z5111 Encounter for antineoplastic chemotherapy: Secondary | ICD-10-CM | POA: Diagnosis not present

## 2017-06-02 MED ORDER — SODIUM CHLORIDE 0.9% FLUSH
10.0000 mL | INTRAVENOUS | Status: DC | PRN
  Filled 2017-06-02: qty 10

## 2017-06-02 MED ORDER — PEGFILGRASTIM 6 MG/0.6ML ~~LOC~~ PSKT
6.0000 mg | PREFILLED_SYRINGE | Freq: Once | SUBCUTANEOUS | Status: AC
  Administered 2017-06-02: 6 mg via SUBCUTANEOUS
  Filled 2017-06-02: qty 0.6

## 2017-06-02 MED ORDER — SODIUM CHLORIDE 0.9 % IV SOLN
200.0000 mg | Freq: Once | INTRAVENOUS | Status: AC
  Administered 2017-06-02: 200 mg via INTRAVENOUS
  Filled 2017-06-02: qty 10

## 2017-06-02 MED ORDER — DEXAMETHASONE SODIUM PHOSPHATE 10 MG/ML IJ SOLN
10.0000 mg | Freq: Once | INTRAMUSCULAR | Status: AC
  Administered 2017-06-02: 10 mg via INTRAVENOUS
  Filled 2017-06-02: qty 1

## 2017-06-02 MED ORDER — HEPARIN SOD (PORK) LOCK FLUSH 100 UNIT/ML IV SOLN
500.0000 [IU] | Freq: Once | INTRAVENOUS | Status: AC
  Administered 2017-06-02: 500 [IU] via INTRAVENOUS
  Filled 2017-06-02: qty 5

## 2017-06-02 MED ORDER — SODIUM CHLORIDE 0.9 % IV SOLN
Freq: Once | INTRAVENOUS | Status: AC
  Administered 2017-06-02: 10:00:00 via INTRAVENOUS
  Filled 2017-06-02: qty 1000

## 2017-06-05 ENCOUNTER — Other Ambulatory Visit: Payer: Self-pay | Admitting: *Deleted

## 2017-06-05 MED ORDER — POTASSIUM CHLORIDE CRYS ER 20 MEQ PO TBCR: 20.0000 meq | EXTENDED_RELEASE_TABLET | Freq: Every day | ORAL | 2 refills | Status: AC

## 2017-06-09 ENCOUNTER — Other Ambulatory Visit: Payer: Self-pay | Admitting: *Deleted

## 2017-06-09 MED ORDER — OXYCODONE-ACETAMINOPHEN 5-325 MG PO TABS: 1.0000 | ORAL_TABLET | Freq: Four times a day (QID) | ORAL | 0 refills | Status: DC | PRN

## 2017-06-14 ENCOUNTER — Inpatient Hospital Stay: Payer: Medicare Other

## 2017-06-14 ENCOUNTER — Other Ambulatory Visit: Payer: Self-pay | Admitting: Oncology

## 2017-06-14 ENCOUNTER — Telehealth: Payer: Self-pay | Admitting: Internal Medicine

## 2017-06-14 ENCOUNTER — Inpatient Hospital Stay: Payer: Medicare Other | Attending: Oncology | Admitting: *Deleted

## 2017-06-14 ENCOUNTER — Ambulatory Visit
Admission: RE | Admit: 2017-06-14 | Discharge: 2017-06-14 | Disposition: A | Discharge status: Home / Self Care | Payer: Medicare Other | Source: Ambulatory Visit | Attending: Oncology | Admitting: Oncology

## 2017-06-14 ENCOUNTER — Telehealth: Payer: Self-pay | Admitting: *Deleted

## 2017-06-14 ENCOUNTER — Inpatient Hospital Stay (HOSPITAL_BASED_OUTPATIENT_CLINIC_OR_DEPARTMENT_OTHER): Payer: Medicare Other | Admitting: Oncology

## 2017-06-14 ENCOUNTER — Telehealth: Payer: Self-pay

## 2017-06-14 VITALS — BP 92/66 | HR 91 | Temp 98.0°F | Resp 18

## 2017-06-14 DIAGNOSIS — D696 Thrombocytopenia, unspecified: Secondary | ICD-10-CM

## 2017-06-14 DIAGNOSIS — C3490 Malignant neoplasm of unspecified part of unspecified bronchus or lung: Secondary | ICD-10-CM | POA: Insufficient documentation

## 2017-06-14 DIAGNOSIS — R112 Nausea with vomiting, unspecified: Secondary | ICD-10-CM | POA: Diagnosis not present

## 2017-06-14 DIAGNOSIS — D649 Anemia, unspecified: Secondary | ICD-10-CM | POA: Diagnosis not present

## 2017-06-14 DIAGNOSIS — R410 Disorientation, unspecified: Secondary | ICD-10-CM

## 2017-06-14 DIAGNOSIS — R590 Localized enlarged lymph nodes: Secondary | ICD-10-CM | POA: Diagnosis not present

## 2017-06-14 DIAGNOSIS — C7951 Secondary malignant neoplasm of bone: Secondary | ICD-10-CM

## 2017-06-14 DIAGNOSIS — N390 Urinary tract infection, site not specified: Secondary | ICD-10-CM

## 2017-06-14 DIAGNOSIS — Z87891 Personal history of nicotine dependence: Secondary | ICD-10-CM | POA: Insufficient documentation

## 2017-06-14 DIAGNOSIS — E876 Hypokalemia: Secondary | ICD-10-CM | POA: Diagnosis not present

## 2017-06-14 DIAGNOSIS — S2231XD Fracture of one rib, right side, subsequent encounter for fracture with routine healing: Secondary | ICD-10-CM | POA: Diagnosis not present

## 2017-06-14 DIAGNOSIS — Z95828 Presence of other vascular implants and grafts: Secondary | ICD-10-CM

## 2017-06-14 DIAGNOSIS — C778 Secondary and unspecified malignant neoplasm of lymph nodes of multiple regions: Secondary | ICD-10-CM | POA: Diagnosis not present

## 2017-06-14 DIAGNOSIS — X58XXXA Exposure to other specified factors, initial encounter: Secondary | ICD-10-CM | POA: Insufficient documentation

## 2017-06-14 DIAGNOSIS — C3412 Malignant neoplasm of upper lobe, left bronchus or lung: Secondary | ICD-10-CM | POA: Insufficient documentation

## 2017-06-14 DIAGNOSIS — C787 Secondary malignant neoplasm of liver and intrahepatic bile duct: Secondary | ICD-10-CM | POA: Diagnosis not present

## 2017-06-14 DIAGNOSIS — J449 Chronic obstructive pulmonary disease, unspecified: Secondary | ICD-10-CM | POA: Diagnosis not present

## 2017-06-14 DIAGNOSIS — N12 Tubulo-interstitial nephritis, not specified as acute or chronic: Secondary | ICD-10-CM

## 2017-06-14 DIAGNOSIS — Z5112 Encounter for antineoplastic immunotherapy: Secondary | ICD-10-CM | POA: Diagnosis not present

## 2017-06-14 DIAGNOSIS — E860 Dehydration: Secondary | ICD-10-CM

## 2017-06-14 DIAGNOSIS — S2232XD Fracture of one rib, left side, subsequent encounter for fracture with routine healing: Secondary | ICD-10-CM | POA: Insufficient documentation

## 2017-06-14 DIAGNOSIS — C3492 Malignant neoplasm of unspecified part of left bronchus or lung: Secondary | ICD-10-CM | POA: Diagnosis not present

## 2017-06-14 LAB — CBC WITH DIFFERENTIAL/PLATELET
BASOS ABS: 0 10*3/uL (ref 0–0.1)
Basophils Relative: 1 %
EOS PCT: 0 %
Eosinophils Absolute: 0 10*3/uL (ref 0–0.7)
HCT: 22.6 % — ABNORMAL LOW (ref 35.0–47.0)
Hemoglobin: 8 g/dL — ABNORMAL LOW (ref 12.0–16.0)
LYMPHS PCT: 16 %
Lymphs Abs: 1.6 10*3/uL (ref 1.0–3.6)
MCH: 34.8 pg — AB (ref 26.0–34.0)
MCHC: 35.5 g/dL (ref 32.0–36.0)
MCV: 98.2 fL (ref 80.0–100.0)
MONO ABS: 0.9 10*3/uL (ref 0.2–0.9)
MONOS PCT: 10 %
Neutro Abs: 7.3 10*3/uL — ABNORMAL HIGH (ref 1.4–6.5)
Neutrophils Relative %: 73 %
PLATELETS: 15 10*3/uL — AB (ref 150–400)
RBC: 2.3 MIL/uL — ABNORMAL LOW (ref 3.80–5.20)
RDW: 16.6 % — AB (ref 11.5–14.5)
WBC: 9.9 10*3/uL (ref 3.6–11.0)

## 2017-06-14 LAB — COMPREHENSIVE METABOLIC PANEL
ALBUMIN: 3.5 g/dL (ref 3.5–5.0)
ALK PHOS: 69 U/L (ref 38–126)
ALT: 19 U/L (ref 14–54)
AST: 27 U/L (ref 15–41)
Anion gap: 12 (ref 5–15)
BUN: 24 mg/dL — AB (ref 6–20)
CO2: 21 mmol/L — ABNORMAL LOW (ref 22–32)
Calcium: 8 mg/dL — ABNORMAL LOW (ref 8.9–10.3)
Chloride: 102 mmol/L (ref 101–111)
Creatinine, Ser: 1.29 mg/dL — ABNORMAL HIGH (ref 0.44–1.00)
GFR calc Af Amer: 49 mL/min — ABNORMAL LOW (ref >60)
GFR calc non Af Amer: 42 mL/min — ABNORMAL LOW (ref >60)
GLUCOSE: 110 mg/dL — AB (ref 65–99)
POTASSIUM: 3.4 mmol/L — AB (ref 3.5–5.1)
Sodium: 135 mmol/L (ref 135–145)
Total Bilirubin: 0.4 mg/dL (ref 0.3–1.2)
Total Protein: 6.7 g/dL (ref 6.5–8.1)

## 2017-06-14 LAB — URINALYSIS, COMPLETE (UACMP) WITH MICROSCOPIC
Bilirubin Urine: NEGATIVE
GLUCOSE, UA: NEGATIVE mg/dL
Ketones, ur: NEGATIVE mg/dL
Nitrite: NEGATIVE
PH: 6 (ref 5.0–8.0)
Protein, ur: NEGATIVE mg/dL
Specific Gravity, Urine: 1.039 — ABNORMAL HIGH (ref 1.005–1.030)
WBC, UA: >50 WBC/hpf — ABNORMAL HIGH (ref 0–5)

## 2017-06-14 LAB — MAGNESIUM: Magnesium: 1.2 mg/dL — ABNORMAL LOW (ref 1.7–2.4)

## 2017-06-14 LAB — AMMONIA: Ammonia: 19 umol/L (ref 9–35)

## 2017-06-14 MED ORDER — SODIUM CHLORIDE 0.9% FLUSH
10.0000 mL | INTRAVENOUS | Status: AC | PRN
  Administered 2017-06-14: 10 mL
  Filled 2017-06-14: qty 10

## 2017-06-14 MED ORDER — SODIUM CHLORIDE 0.9 % IV SOLN
Freq: Once | INTRAVENOUS | Status: AC
  Administered 2017-06-14: 12:00:00 via INTRAVENOUS
  Filled 2017-06-14: qty 1000

## 2017-06-14 MED ORDER — SODIUM CHLORIDE 0.9% FLUSH
10.0000 mL | INTRAVENOUS | Status: DC | PRN
  Administered 2017-06-14: 10 mL via INTRAVENOUS
  Filled 2017-06-14: qty 10

## 2017-06-14 MED ORDER — IOHEXOL 300 MG/ML  SOLN
100.0000 mL | Freq: Once | INTRAMUSCULAR | Status: AC | PRN
  Administered 2017-06-14: 100 mL via INTRAVENOUS

## 2017-06-14 MED ORDER — HEPARIN SOD (PORK) LOCK FLUSH 100 UNIT/ML IV SOLN: 500.0000 [IU] | INTRAVENOUS | Status: DC | PRN

## 2017-06-14 MED ORDER — MAGNESIUM SULFATE 4 GM/100ML IV SOLN
4.0000 g | Freq: Once | INTRAVENOUS | Status: AC
  Administered 2017-06-14: 4 g via INTRAVENOUS

## 2017-06-14 MED ORDER — SODIUM CHLORIDE 0.9 % IV SOLN: INTRAVENOUS | Status: DC

## 2017-06-14 MED ORDER — HEPARIN SOD (PORK) LOCK FLUSH 100 UNIT/ML IV SOLN
500.0000 [IU] | Freq: Once | INTRAVENOUS | Status: AC
  Administered 2017-06-14: 500 [IU] via INTRAVENOUS

## 2017-06-14 MED ORDER — CIPROFLOXACIN HCL 500 MG PO TABS: 500.0000 mg | ORAL_TABLET | Freq: Two times a day (BID) | ORAL | 0 refills | Status: DC

## 2017-06-14 NOTE — Progress Notes (Signed)
Labs ordered.

## 2017-06-14 NOTE — Telephone Encounter (Signed)
Called Report:  IMPRESSION: 1. New diffuse bladder wall thickening with perivesical fat stranding suggesting acute cystitis. Recommend correlation with urinalysis. 2. New patchy parenchymal enhancement throughout the mid to upper left kidney worrisome for acute left pyelonephritis. No evidence of renal abscess. 3. Interval positive treatment response. Primary tumor in the central left upper lung lobe is decreased. Thoracic adenopathy is decreased. Cavitary pulmonary nodules in the right lung are all decreased. Liver metastases are decreased. 4. Faintly sclerotic osseous metastases throughout the visualized skeleton, slightly increased in sclerosis, which may be due to treatment response. No convincing new or progressive metastatic disease. 5. Healing subacute bilateral rib fractures.  These results will be called to the ordering clinician or representative by the Radiologist Assistant, and communication documented in the PACS or zVision Dashboard.   Electronically Signed   By: Ilona Sorrel M.D.   On: 06/14/2017 13:05

## 2017-06-14 NOTE — Telephone Encounter (Signed)
Received call from Ms. Gasser's daughter. She is having some slight mental status changes. No true confusion and is slightly better today than yesterday after taking her "sodium medication". She does not feel like she needs to be seen in [facility] at this time. Spoke with Sonia Baller, NP and she will order some labs for today following Ct scan. They are aware and will come over following scan. Oncology Nurse Navigator Documentation  Navigator Location: [facility] (06/14/17 0800)   )Navigator Encounter Type: Telephone (06/14/17 0800) Telephone: Symptom Mgt (06/14/17 0800)                                                  Time Spent with Patient: 15 (06/14/17 0800)

## 2017-06-14 NOTE — Telephone Encounter (Signed)
Tillie Rung- Please call- shawn/CT re: questions re: pre-meds. At [place]- Thx.

## 2017-06-14 NOTE — Progress Notes (Signed)
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 Grayland Ormond on 05/10/2017 for further evaluation and consideration of cycle 5-day 1 of carbo/etoposide and Tecentriq.  At that visit she continued to have chronic pain, chronic shortness of breath, chronic fatigue and weakness and occasional diarrhea, but otherwise felt well.  Patient completed cycle 6-day 3 on 06/02/2017.  She is awaiting repeat CT scans for restaging.  Imaging from April 04, 2017 revealed a mixed response with the left upper lobe mass significantly decreased and mild thoracic nodal metastasis improved.  Improving abdominal lymphadenopathy.  New bilateral pulmonary nodules were noted that were worrisome for metastatic disease.   She had restaging scans completed this morning prior to her visit.  Interval history-  Patient presents to Symptom Management clinic for confusion.  Symptoms presented several days ago. Onset was gradual.  Daughter noticed symptoms a few days ago and was concerned her sodium level was low.  Oxygen saturations have been fine at home.  She is taking nebulizer treatments as needed for shortness of breath. Symptoms are fluctuating. Describes symptoms as moderate. Associated symptoms: Dizziness and feeling wobbly when standing. Resting makes symptoms better.  Standing makes symptoms worse.  Denies fever.  Treatments tried nothing. Evaluation to date none.  ECOG FS:1 - Symptomatic but completely ambulatory  Review of systems- Review of Systems    Constitutional: Positive for chills and malaise/fatigue. Negative for fever and weight loss.  HENT: Negative for congestion and ear pain.   Eyes: Negative.  Negative for blurred vision and double vision.  Respiratory: Positive for shortness of breath (Chronic). Negative for cough and sputum production.   Cardiovascular: Negative.  Negative for chest pain, palpitations and leg swelling.  Gastrointestinal: Negative.  Negative for abdominal pain, constipation, diarrhea, nausea and vomiting.  Genitourinary: Negative for dysuria, frequency and urgency.  Musculoskeletal: Negative for back pain and falls.  Skin: Negative.  Negative for rash.  Neurological: Positive for dizziness and weakness. Negative for headaches.  Endo/Heme/Allergies: Negative.  Does not bruise/bleed easily.  Psychiatric/Behavioral: Negative.  Negative for depression. The patient is not nervous/anxious and does not have insomnia.        Intermittent confusion noted by her daughter     Current treatment- Observation at this time.  Last treatment was cycle 6-day 1-3 of carbo/etoposide and Tecentriq completing on 06/02/2017.   Allergies  Allergen Reactions  . Fish Allergy Anaphylaxis    Per pt after eating [facility] she had throat swelling and SOB. MSY   . Fentanyl     Overly sedated and groggy, ineffective      Past Medical History:  Diagnosis Date  . Anxiety   . Arthritis   . Asthma    COPD  . Cancer ([place])    Liver  . CAP (community acquired pneumonia) 06/30/2014  . Cardiomyopathy ([place])    nonischemic (EF 35-40%)  . CHF (congestive heart failure) ([place])   . COPD (chronic obstructive pulmonary disease) ([place])   . Depression   . Dyspnea   . GERD (gastroesophageal reflux disease)   . Hyperlipidemia   .  Hypertension   . Pneumonia   . PVC's (premature ventricular contractions)    states 10 years ago  . Sepsis ([place]) 06/30/2014  . SOB (shortness of breath) 05/08/2014  . Spinal cord injury at T7-T12 level [facility])     2016     Past Surgical History:  Procedure Laterality Date  . APPENDECTOMY    . CARDIAC CATHETERIZATION    . CATARACT EXTRACTION     right eye   . CESAREAN SECTION    . COLONOSCOPY    . LAMINOTOMY    . LUMBAR LAMINECTOMY/DECOMPRESSION MICRODISCECTOMY Left 08/01/2016   Procedure: Left Lumbar Four-Five Laminectomy and Foraminotomy;  Surgeon: Earnie Larsson, MD;  Location: [place];  Service: Neurosurgery;  Laterality: Left;  . PORTA CATH INSERTION N/A 02/03/2017   Procedure: PORTA CATH INSERTION;  Surgeon: Katha Cabal, MD;  Location: [place] [facility];  Service: Cardiovascular;  Laterality: N/A;    Social History   Socioeconomic History  . Marital status: Married    Spouse name: Not on file  . Number of children: Not on file  . Years of education: Not on file  . Highest education level: Not on file  Occupational History  . Not on file  Social Needs  . Financial resource strain: Not on file  . Food insecurity:    Worry: Not on file    Inability: Not on file  . Transportation needs:    Medical: Not on file    Non-medical: Not on file  Tobacco Use  . Smoking status: Former Smoker    Packs/day: 0.50    Years: 50.00    Pack years: 25.00    Types: Cigarettes    Last attempt to quit: 01/27/2015    Years since quitting: 2.3  . Smokeless tobacco: Never Used  Substance and Sexual Activity  . Alcohol use: No  . Drug use: No  . Sexual activity: Not Currently  Lifestyle  . Physical activity:    Days per week: Not on file    Minutes per session: Not on file  . Stress: Not on file  Relationships  . Social connections:    Talks on phone: Not on file    Gets together: Not on file    Attends religious service: Not on file    Active member of club or organization: Not on file    Attends meetings of clubs or organizations: Not on file    Relationship status: Not on file  . Intimate partner violence:    Fear of current or ex partner: Not on file    Emotionally abused: Not  on file    Physically abused: Not on file    Forced sexual activity: Not on file  Other Topics Concern  . Not on file  Social History Narrative  . Not on file    Family History  Problem Relation Age of Onset  . Heart attack Father 81       MI x 3      Current Outpatient Medications:  .  ADVAIR DISKUS 250-50 MCG/DOSE AEPB, Inhale 1 puff into the lungs 2 (two) times daily., Disp: , Rfl:  .  albuterol (PROAIR HFA) 108 (90 Base) MCG/ACT inhaler, Inhale 2 puffs into the lungs every 6 (six) hours as needed for wheezing or shortness of breath. , Disp: , Rfl:  .  alendronate (FOSAMAX) 70 MG tablet, Take 70 mg by mouth every Thursday. , Disp: , Rfl:  .  ARIPiprazole (ABILIFY) 5 MG tablet, Take  5 mg by mouth daily., Disp: , Rfl:  .  diphenoxylate-atropine (LOMOTIL) 2.5-0.025 MG tablet, Take 1 tablet by mouth 4 (four) times daily as needed for diarrhea or loose stools., Disp: 30 tablet, Rfl: 0 .  DULoxetine (CYMBALTA) 60 MG capsule, Take 60 mg by mouth daily., Disp: , Rfl:  .  ipratropium-albuterol (DUONEB) 0.5-2.5 (3) MG/3ML SOLN, Take 3 mLs by nebulization every 4 (four) hours as needed., Disp: 360 mL, Rfl: 1 .  lidocaine-prilocaine (EMLA) cream, Apply to affected area once, Disp: 30 g, Rfl: 3 .  LORazepam (ATIVAN) 0.5 MG tablet, Take 0.5 mg by mouth as needed., Disp: , Rfl:  .  metoprolol succinate (TOPROL-XL) 100 MG 24 hr tablet, TAKE ONE TABLET BY MOUTH ONCE DAILY, Disp: 30 tablet, Rfl: 0 .  oxyCODONE-acetaminophen (PERCOCET/ROXICET) 5-325 MG tablet, Take 1-2 tablets by mouth every 6 (six) hours as needed. For pain., Disp: 120 tablet, Rfl: 0 .  pantoprazole (PROTONIX) 40 MG tablet, Take 1 tablet by mouth daily., Disp: , Rfl:  .  potassium chloride SA (K-DUR,KLOR-CON) 20 MEQ tablet, Take 1 tablet (20 mEq total) by mouth daily., Disp: 30 tablet, Rfl: 2 .  prochlorperazine (COMPAZINE) 10 MG tablet, Take 1 tablet (10 mg total) by mouth every 6 (six) hours as needed (Nausea or vomiting)., Disp: 60  tablet, Rfl: 2 .  promethazine (PHENERGAN) 25 MG tablet, Take 25 mg by mouth every 8 (eight) hours as needed for nausea or vomiting., Disp: , Rfl:  .  sacubitril-valsartan (ENTRESTO) 49-51 MG, Takes 1 1/2 tablet by mouth twice daily. (Patient taking differently: Takes 1 tablet in AM and 1/2 tablet in PM), Disp: 45 tablet, Rfl: 5 .  benzonatate (TESSALON) 100 MG capsule, Take 1 capsule (100 mg total) by mouth every 8 (eight) hours as needed for cough. (Patient not taking: Reported on 06/14/2017), Disp: 20 capsule, Rfl: 0 .  Calcium Citrate-Vitamin D (CALCIUM + D PO), Take 1 tablet by mouth 2 (two) times daily., Disp: , Rfl:  .  ciprofloxacin (CIPRO) 500 MG tablet, Take 1 tablet (500 mg total) by mouth 2 (two) times daily., Disp: 7 tablet, Rfl: 0 No current facility-administered medications for this visit.   Facility-Administered Medications Ordered in Other Visits:  .  etoposide (VEPESID) 200 mg in sodium chloride 0.9 % 500 mL chemo infusion, 200 mg, Intravenous, Once, Finnegan, Kathlene November, MD .  heparin lock flush 100 unit/mL, 500 Units, Intracatheter, PRN, Grayland Ormond, Kathlene November, MD .  sodium chloride flush (NS) 0.9 % injection 10 mL, 10 mL, Intravenous, PRN, Lloyd Huger, MD, 10 mL at 03/01/17 0841  Physical exam:  Vitals:   06/14/17 1059  BP: 92/66  Pulse: 91  Resp: 18  Temp: 98 F (36.7 C)  TempSrc: Tympanic   Physical Exam  Constitutional: She is oriented to person, place, and time. Vital signs are normal. She appears well-developed and well-nourished.  HENT:  Head: Normocephalic and atraumatic.  Eyes: Pupils are equal, round, and reactive to light.  Neck: Normal range of motion.  Cardiovascular: Normal rate, regular rhythm and normal heart sounds.  No murmur heard. Pulmonary/Chest: Effort normal and breath sounds normal. She has no wheezes.  Abdominal: Soft. Normal appearance and bowel sounds are normal. She exhibits no distension. There is no tenderness. There is CVA  tenderness.  Musculoskeletal: Normal range of motion. She exhibits no edema.  Neurological: She is alert and oriented to person, place, and time.  Skin: Skin is warm and dry. No rash noted.  Psychiatric: Her  behavior is normal. Judgment normal.     CMP Latest Ref Rng & Units 06/14/2017  Glucose 65 - 99 mg/dL 110(H)  BUN 6 - 20 mg/dL 24(H)  Creatinine 0.44 - 1.00 mg/dL 1.29(H)  Sodium 135 - 145 mmol/L 135  Potassium 3.5 - 5.1 mmol/L 3.4(L)  Chloride 101 - 111 mmol/L 102  CO2 22 - 32 mmol/L 21(L)  Calcium 8.9 - 10.3 mg/dL 8.0(L)  Total Protein 6.5 - 8.1 g/dL 6.7  Total Bilirubin 0.3 - 1.2 mg/dL 0.4  Alkaline Phos 38 - 126 U/L 69  AST 15 - 41 U/L 27  ALT 14 - 54 U/L 19   CBC Latest Ref Rng & Units 06/14/2017  WBC 3.6 - 11.0 K/uL 9.9  Hemoglobin 12.0 - 16.0 g/dL 8.0(L)  Hematocrit 35.0 - 47.0 % 22.6(L)  Platelets 150 - 400 K/uL 15(LL)    No images are attached to the encounter.  Ct Chest W Contrast  Result Date: 06/14/2017 CLINICAL DATA:  Extensive stage small cell left lung cancer. Restaging with ongoing chemotherapy. EXAM: CT CHEST, ABDOMEN, AND PELVIS WITH CONTRAST TECHNIQUE: Multidetector CT imaging of the chest, abdomen and pelvis was performed following the standard protocol during bolus administration of intravenous contrast. CONTRAST:  150m OMNIPAQUE IOHEXOL 300 MG/ML  SOLN COMPARISON:  04/04/2017 CT chest, abdomen and pelvis. FINDINGS: CT CHEST FINDINGS Cardiovascular: Normal heart size. No significant pericardial fluid/thickening. Right internal jugular MediPort terminates at the cavoatrial junction. Left main and 3 vessel coronary atherosclerosis. Atherosclerotic nonaneurysmal thoracic aorta. Normal caliber pulmonary arteries. No central pulmonary emboli. Mediastinum/Nodes: No discrete thyroid nodules. Unremarkable esophagus. No axillary adenopathy. No pathologically enlarged mediastinal or hilar nodes. Previously described mildly enlarged 1.0 cm right hilar node is decreased to  0.6 cm (series 2/image 25). Previously described mildly enlarged 1.0 cm upper right paratracheal node is decreased to 0.7 cm (series 2/image 12). Lungs/Pleura: No pneumothorax. No pleural effusion. Central left upper lobe pulmonary nodule is decreased, currently 1.7 x 1.3 cm (series 4/image 51), previously 2.3 x 2.1 cm using similar measurement technique. Thick parenchymal band in the left upper lobe is significantly decreased in thickness, compatible with evolving postinflammatory scarring. No acute consolidative airspace disease. The previously described variably cavitary irregular sub solid pulmonary nodules scattered throughout the right lung have significantly decreased in size. Representative 0.9 cm right lower lobe nodule (series 4/image 69), decreased from 1.8 cm. Separate 1.3 cm right lower lobe nodule (series 4/image 87), decreased from 2.2 cm. Right upper lobe 0.3 cm nodule (series 4/image 29), decreased from 0.9 cm. No new or enlarging pulmonary nodules. Musculoskeletal: Healing subacute posterior right eighth and posterolateral left seventh rib fractures. Stable severe T5 and mild-to-moderate T7 vertebral compression fractures. Numerous sclerotic osseous lesions throughout the thoracic skeleton, most prominent throughout the thoracic vertebral bodies are slightly increased in sclerosis and not appreciably changed in size or number. No appreciable new thoracic osseous lesions. Moderate thoracic spondylosis. CT ABDOMEN PELVIS FINDINGS Hepatobiliary: Numerous (greater than 6) hypodense liver masses have all decreased in size, with representative masses as follows: -anterior segment 4 left liver lobe 3.7 x 2.2 cm mass (series 2/image 54), decreased from 4.0 x 3.2 cm -segment 3 left lower lobe 1.5 x 1.3 cm mass (series 2/image 57), decreased from 2.1 x 2.0 cm -segment 7 right liver lobe 1.3 x 1.2 cm mass (series 2/image 50), decreased from 1.8 x 1.3 cm No appreciable new or enlarging liver masses. Normal  gallbladder with no radiopaque cholelithiasis. No intrahepatic biliary ductal dilatation. Stable dilated  CBD (13 mm diameter) without obstructing stone or mass by CT. Pancreas: Normal, with no mass or duct dilation. Spleen: Normal size. No mass. Adrenals/Urinary Tract: Normal adrenals. No hydronephrosis. There is new patchy parenchymal enhancement throughout the mid to upper left kidney (series 6/image 14) without discrete renal mass. Collapsed bladder. New diffuse bladder wall thickening with perivesical fat stranding. Stomach/Bowel: Normal non-distended stomach. Normal caliber small bowel with no small bowel wall thickening. Appendectomy. Mild sigmoid diverticulosis, with no large bowel wall thickening or significant pericolonic fat stranding. Vascular/Lymphatic: Atherosclerotic nonaneurysmal abdominal aorta. Patent portal, splenic, hepatic and renal veins. No pathologically enlarged lymph nodes in the abdomen or pelvis. Reproductive: Grossly normal uterus.  No adnexal mass. Other: No pneumoperitoneum, ascites or focal fluid collection. Musculoskeletal: Small sclerotic lesions throughout the lumbar spine, sacrum and bilateral pelvic girdle are slightly increased in sclerosis without appreciable changed in lesion size or number. Marked lumbar spondylosis. IMPRESSION: 1. New diffuse bladder wall thickening with perivesical fat stranding suggesting acute cystitis. Recommend correlation with urinalysis. 2. New patchy parenchymal enhancement throughout the mid to upper left kidney worrisome for acute left pyelonephritis. No evidence of renal abscess. 3. Interval positive treatment response. Primary tumor in the central left upper lung lobe is decreased. Thoracic adenopathy is decreased. Cavitary pulmonary nodules in the right lung are all decreased. Liver metastases are decreased. 4. Faintly sclerotic osseous metastases throughout the visualized skeleton, slightly increased in sclerosis, which may be due to treatment  response. No convincing new or progressive metastatic disease. 5. Healing subacute bilateral rib fractures. These results will be called to the ordering clinician or representative by the Radiologist Assistant, and communication documented in the PACS or zVision Dashboard. Electronically Signed   By: Ilona Sorrel M.D.   On: 06/14/2017 13:05   Ct Abdomen Pelvis W Contrast  Result Date: 06/14/2017 CLINICAL DATA:  Extensive stage small cell left lung cancer. Restaging with ongoing chemotherapy. EXAM: CT CHEST, ABDOMEN, AND PELVIS WITH CONTRAST TECHNIQUE: Multidetector CT imaging of the chest, abdomen and pelvis was performed following the standard protocol during bolus administration of intravenous contrast. CONTRAST:  180m OMNIPAQUE IOHEXOL 300 MG/ML  SOLN COMPARISON:  04/04/2017 CT chest, abdomen and pelvis. FINDINGS: CT CHEST FINDINGS Cardiovascular: Normal heart size. No significant pericardial fluid/thickening. Right internal jugular MediPort terminates at the cavoatrial junction. Left main and 3 vessel coronary atherosclerosis. Atherosclerotic nonaneurysmal thoracic aorta. Normal caliber pulmonary arteries. No central pulmonary emboli. Mediastinum/Nodes: No discrete thyroid nodules. Unremarkable esophagus. No axillary adenopathy. No pathologically enlarged mediastinal or hilar nodes. Previously described mildly enlarged 1.0 cm right hilar node is decreased to 0.6 cm (series 2/image 25). Previously described mildly enlarged 1.0 cm upper right paratracheal node is decreased to 0.7 cm (series 2/image 12). Lungs/Pleura: No pneumothorax. No pleural effusion. Central left upper lobe pulmonary nodule is decreased, currently 1.7 x 1.3 cm (series 4/image 51), previously 2.3 x 2.1 cm using similar measurement technique. Thick parenchymal band in the left upper lobe is significantly decreased in thickness, compatible with evolving postinflammatory scarring. No acute consolidative airspace disease. The previously  described variably cavitary irregular sub solid pulmonary nodules scattered throughout the right lung have significantly decreased in size. Representative 0.9 cm right lower lobe nodule (series 4/image 69), decreased from 1.8 cm. Separate 1.3 cm right lower lobe nodule (series 4/image 87), decreased from 2.2 cm. Right upper lobe 0.3 cm nodule (series 4/image 29), decreased from 0.9 cm. No new or enlarging pulmonary nodules. Musculoskeletal: Healing subacute posterior right eighth and posterolateral left seventh rib  fractures. Stable severe T5 and mild-to-moderate T7 vertebral compression fractures. Numerous sclerotic osseous lesions throughout the thoracic skeleton, most prominent throughout the thoracic vertebral bodies are slightly increased in sclerosis and not appreciably changed in size or number. No appreciable new thoracic osseous lesions. Moderate thoracic spondylosis. CT ABDOMEN PELVIS FINDINGS Hepatobiliary: Numerous (greater than 6) hypodense liver masses have all decreased in size, with representative masses as follows: -anterior segment 4 left liver lobe 3.7 x 2.2 cm mass (series 2/image 54), decreased from 4.0 x 3.2 cm -segment 3 left lower lobe 1.5 x 1.3 cm mass (series 2/image 57), decreased from 2.1 x 2.0 cm -segment 7 right liver lobe 1.3 x 1.2 cm mass (series 2/image 50), decreased from 1.8 x 1.3 cm No appreciable new or enlarging liver masses. Normal gallbladder with no radiopaque cholelithiasis. No intrahepatic biliary ductal dilatation. Stable dilated CBD (13 mm diameter) without obstructing stone or mass by CT. Pancreas: Normal, with no mass or duct dilation. Spleen: Normal size. No mass. Adrenals/Urinary Tract: Normal adrenals. No hydronephrosis. There is new patchy parenchymal enhancement throughout the mid to upper left kidney (series 6/image 14) without discrete renal mass. Collapsed bladder. New diffuse bladder wall thickening with perivesical fat stranding. Stomach/Bowel: Normal  non-distended stomach. Normal caliber small bowel with no small bowel wall thickening. Appendectomy. Mild sigmoid diverticulosis, with no large bowel wall thickening or significant pericolonic fat stranding. Vascular/Lymphatic: Atherosclerotic nonaneurysmal abdominal aorta. Patent portal, splenic, hepatic and renal veins. No pathologically enlarged lymph nodes in the abdomen or pelvis. Reproductive: Grossly normal uterus.  No adnexal mass. Other: No pneumoperitoneum, ascites or focal fluid collection. Musculoskeletal: Small sclerotic lesions throughout the lumbar spine, sacrum and bilateral pelvic girdle are slightly increased in sclerosis without appreciable changed in lesion size or number. Marked lumbar spondylosis. IMPRESSION: 1. New diffuse bladder wall thickening with perivesical fat stranding suggesting acute cystitis. Recommend correlation with urinalysis. 2. New patchy parenchymal enhancement throughout the mid to upper left kidney worrisome for acute left pyelonephritis. No evidence of renal abscess. 3. Interval positive treatment response. Primary tumor in the central left upper lung lobe is decreased. Thoracic adenopathy is decreased. Cavitary pulmonary nodules in the right lung are all decreased. Liver metastases are decreased. 4. Faintly sclerotic osseous metastases throughout the visualized skeleton, slightly increased in sclerosis, which may be due to treatment response. No convincing new or progressive metastatic disease. 5. Healing subacute bilateral rib fractures. These results will be called to the ordering clinician or representative by the Radiologist Assistant, and communication documented in the PACS or zVision Dashboard. Electronically Signed   By: Ilona Sorrel M.D.   On: 06/14/2017 13:05     Assessment and plan- Patient is a 67 y.o. female who presents with her daughter for intermittent confusion x3 days.  Plan:  Stat labs including CBC with differential, C met, magnesium and  UA/urine culture.  Labs reveal slight hypokalemia (3.4), hypomagnesemia (1.2), slightly elevated creatinine and BUN (worrisome for dehydration), anemia ( hemoglobin 8.0) and a significant drop in her platelet count (15, 000).  Urinalysis positive for urinary tract infection.  Grew greater than 100,000 colonies of Klebsiella pneumoniae.  Review CT of abdomen/pelvis/chest.  1.  Stage IV small cell lung cancer: S/p 6 cycles of carbo/etoposide and Tecentriq.  Most recent imaging from 06/14/2016 showed positive treatment response.  Primary tumor in left upper lobe has decreased, thoracic adenopathy has decreased, pulmonary nodules and right lung are decreased and liver metastasis have decreased.  It did show diffuse bladder wall thickening suggesting  acute cystitis and new patchy parenchymal enhancement throughout the mid to upper left kidney worrisome for acute left pyelonephritis.  Dr. Grayland Ormond notified of results.  She is scheduled to return to clinic to be evaluated by Dr. Grayland Ormond on 06/21/2017 for imaging results and continuation of carbo/etoposide and Tecentriq.  2.  Confusion/pyelonephritis/urinary tract infection: We will get urinalysis and urine culture STAT.  Confusion probably related to discovered findings from CT scan and UA.  Patient did say during assessment "I feel like I may have a urinary tract infection".  Vital signs are stable during her visit today.  She is afebrile.  Given patient is stable and essentially asymptomatic, we will treat her outpatient with RX Cipro 500 mg twice daily for 14 days (extended course).  She is to return to clinic on Friday for possible platelet transfusion and we will repeat UA and culture to see if there is any improvement and to reassess.  3.  Hypomagnesemia/dehydration:  We will give 1 L NaCl with 4 g of magnesium in clinic today.  Recheck labs on Friday.  4.  Thrombocytopenia: Platelet count 15,000 today.  This is a significant drop.  Patient denies any bleeding.   We will get blood work today for platelet transfusion on Friday.  We will recheck labs on Friday.  Visit Diagnosis 1. Thrombocytopenia ([place])   2. Hypomagnesemia   3. Pyelonephritis   4. Small cell lung cancer [facility])     Patient expressed understanding and was in agreement with this plan. She also understands that She can call clinic at any time with any questions, concerns, or complaints.   Greater than 50% was spent in counseling and coordination of care with this patient including but not limited to discussion of the relevant topics above (See A&P) including, but not limited to diagnosis and management of acute and chronic medical conditions.    Faythe Casa, AGNP-C [facility] [phone] Pager- [phone] 06/16/2017 8:34 AM

## 2017-06-16 ENCOUNTER — Other Ambulatory Visit: Payer: Self-pay | Admitting: Oncology

## 2017-06-16 ENCOUNTER — Inpatient Hospital Stay: Payer: Medicare Other

## 2017-06-16 ENCOUNTER — Other Ambulatory Visit: Payer: Self-pay | Admitting: *Deleted

## 2017-06-16 DIAGNOSIS — C3412 Malignant neoplasm of upper lobe, left bronchus or lung: Secondary | ICD-10-CM | POA: Diagnosis not present

## 2017-06-16 DIAGNOSIS — C3490 Malignant neoplasm of unspecified part of unspecified bronchus or lung: Secondary | ICD-10-CM

## 2017-06-16 DIAGNOSIS — C7951 Secondary malignant neoplasm of bone: Secondary | ICD-10-CM | POA: Diagnosis not present

## 2017-06-16 DIAGNOSIS — E860 Dehydration: Secondary | ICD-10-CM

## 2017-06-16 DIAGNOSIS — Z5112 Encounter for antineoplastic immunotherapy: Secondary | ICD-10-CM | POA: Diagnosis not present

## 2017-06-16 DIAGNOSIS — C778 Secondary and unspecified malignant neoplasm of lymph nodes of multiple regions: Secondary | ICD-10-CM | POA: Diagnosis not present

## 2017-06-16 DIAGNOSIS — N12 Tubulo-interstitial nephritis, not specified as acute or chronic: Secondary | ICD-10-CM

## 2017-06-16 DIAGNOSIS — C787 Secondary malignant neoplasm of liver and intrahepatic bile duct: Secondary | ICD-10-CM | POA: Diagnosis not present

## 2017-06-16 DIAGNOSIS — D696 Thrombocytopenia, unspecified: Secondary | ICD-10-CM

## 2017-06-16 LAB — CBC WITH DIFFERENTIAL/PLATELET
Basophils Absolute: 0 10*3/uL (ref 0–0.1)
Basophils Relative: 0 %
EOS ABS: 0 10*3/uL (ref 0–0.7)
EOS PCT: 0 %
HCT: 21.5 % — ABNORMAL LOW (ref 35.0–47.0)
Hemoglobin: 7.5 g/dL — ABNORMAL LOW (ref 12.0–16.0)
LYMPHS ABS: 1.9 10*3/uL (ref 1.0–3.6)
Lymphocytes Relative: 19 %
MCH: 34.6 pg — AB (ref 26.0–34.0)
MCHC: 35 g/dL (ref 32.0–36.0)
MCV: 99 fL (ref 80.0–100.0)
Monocytes Absolute: 1.2 10*3/uL — ABNORMAL HIGH (ref 0.2–0.9)
Monocytes Relative: 12 %
Neutro Abs: 7.1 10*3/uL — ABNORMAL HIGH (ref 1.4–6.5)
Neutrophils Relative %: 69 %
Platelets: 25 10*3/uL — CL (ref 150–400)
RBC: 2.17 MIL/uL — AB (ref 3.80–5.20)
RDW: 16.7 % — ABNORMAL HIGH (ref 11.5–14.5)
WBC: 10.2 10*3/uL (ref 3.6–11.0)

## 2017-06-16 LAB — COMPREHENSIVE METABOLIC PANEL
ALT: 17 U/L (ref 14–54)
AST: 22 U/L (ref 15–41)
Albumin: 3.1 g/dL — ABNORMAL LOW (ref 3.5–5.0)
Alkaline Phosphatase: 63 U/L (ref 38–126)
Anion gap: 7 (ref 5–15)
BILIRUBIN TOTAL: 0.4 mg/dL (ref 0.3–1.2)
BUN: 12 mg/dL (ref 6–20)
CHLORIDE: 107 mmol/L (ref 101–111)
CO2: 23 mmol/L (ref 22–32)
CREATININE: 0.8 mg/dL (ref 0.44–1.00)
Calcium: 8 mg/dL — ABNORMAL LOW (ref 8.9–10.3)
GFR calc Af Amer: >60 mL/min (ref >60)
Glucose, Bld: 109 mg/dL — ABNORMAL HIGH (ref 65–99)
Potassium: 3.3 mmol/L — ABNORMAL LOW (ref 3.5–5.1)
Sodium: 137 mmol/L (ref 135–145)
Total Protein: 6.3 g/dL — ABNORMAL LOW (ref 6.5–8.1)

## 2017-06-16 LAB — URINALYSIS, COMPLETE (UACMP) WITH MICROSCOPIC
Bacteria, UA: NONE SEEN
Bilirubin Urine: NEGATIVE
GLUCOSE, UA: NEGATIVE mg/dL
Ketones, ur: NEGATIVE mg/dL
Leukocytes, UA: NEGATIVE
Nitrite: POSITIVE — AB
Protein, ur: NEGATIVE mg/dL
SPECIFIC GRAVITY, URINE: 1.011 (ref 1.005–1.030)
pH: 6 (ref 5.0–8.0)

## 2017-06-16 LAB — MAGNESIUM: Magnesium: 1.3 mg/dL — ABNORMAL LOW (ref 1.7–2.4)

## 2017-06-16 MED ORDER — ACETAMINOPHEN 325 MG PO TABS
650.0000 mg | ORAL_TABLET | Freq: Once | ORAL | Status: AC
  Administered 2017-06-16: 650 mg via ORAL
  Filled 2017-06-16: qty 2

## 2017-06-16 MED ORDER — SODIUM CHLORIDE 0.9% FLUSH
10.0000 mL | Freq: Once | INTRAVENOUS | Status: AC
  Administered 2017-06-16: 10 mL via INTRAVENOUS
  Filled 2017-06-16: qty 10

## 2017-06-16 MED ORDER — MAGNESIUM SULFATE 2 GM/50ML IV SOLN
2.0000 g | Freq: Once | INTRAVENOUS | Status: AC
  Administered 2017-06-16: 2 g via INTRAVENOUS
  Filled 2017-06-16: qty 50

## 2017-06-16 MED ORDER — HEPARIN SOD (PORK) LOCK FLUSH 100 UNIT/ML IV SOLN
500.0000 [IU] | Freq: Once | INTRAVENOUS | Status: AC
  Administered 2017-06-16: 500 [IU] via INTRAVENOUS
  Filled 2017-06-16: qty 5

## 2017-06-16 MED ORDER — CIPROFLOXACIN HCL 500 MG PO TABS: 500.0000 mg | ORAL_TABLET | Freq: Two times a day (BID) | ORAL | 0 refills | Status: DC

## 2017-06-16 MED ORDER — SODIUM CHLORIDE 0.9 % IV SOLN: 2.0000 g | Freq: Once | INTRAVENOUS | Status: DC

## 2017-06-16 MED ORDER — SODIUM CHLORIDE 0.9 % IV SOLN
Freq: Once | INTRAVENOUS | Status: AC
  Administered 2017-06-16: 10:00:00 via INTRAVENOUS
  Filled 2017-06-16: qty 1000

## 2017-06-16 MED ORDER — DIPHENHYDRAMINE HCL 25 MG PO CAPS
25.0000 mg | ORAL_CAPSULE | Freq: Once | ORAL | Status: AC
  Administered 2017-06-16: 25 mg via ORAL
  Filled 2017-06-16: qty 1

## 2017-06-16 NOTE — Progress Notes (Signed)
hold

## 2017-06-17 LAB — URINE CULTURE: Culture: >=100000 — AB

## 2017-06-17 LAB — PREPARE PLATELET PHERESIS: Unit division: 0

## 2017-06-17 LAB — BPAM PLATELET PHERESIS
BLOOD PRODUCT EXPIRATION DATE: 201905042359
ISSUE DATE / TIME: 201905031015
Unit Type and Rh: 600

## 2017-06-19 NOTE — Progress Notes (Signed)
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  INTERVAL HISTORY: Patient returns to clinic today for further evaluation and consideration of maintenance Tecentriq.  She continues on antibiotics for a recent UTI and states she is still symptomatic with frequency and burning.  She otherwise feels well.  She continues to have chronic pain, but this is well controlled with her current narcotics. She has no neurologic complaints.  She denies any fevers.  She has a fair appetite and denies weight loss.  She has chronic shortness of breath, but denies any chest pain or hemoptysis.  No nausea, vomiting, constipation, or diarrhea.  She has no abdominal pain.  She denies any melena or hematochezia.  Patient offers no further specific complaints today.  REVIEW OF SYSTEMS:   Review of Systems  Constitutional: Positive for malaise/fatigue. Negative for fever and weight loss.  Respiratory: Positive for cough and shortness of breath. Negative for hemoptysis and wheezing.   Cardiovascular: Negative.  Negative for chest pain and leg swelling.  Gastrointestinal: Negative.  Negative for abdominal pain, blood in stool, diarrhea and melena.  Genitourinary: Positive for dysuria and frequency.  Musculoskeletal: Positive for back pain, joint pain and neck pain.  Skin: Negative.  Negative for rash.  Neurological: Positive for weakness. Negative for sensory change and focal weakness.  Psychiatric/Behavioral: Negative.  The patient is not nervous/anxious.     As per HPI. Otherwise, a complete review of systems is negative.  PAST MEDICAL HISTORY: Past Medical History:  Diagnosis Date  . Anxiety   . Arthritis   . Asthma     COPD  . Cancer ([place])    Liver  . CAP (community acquired pneumonia) 06/30/2014  . Cardiomyopathy ([place])    nonischemic (EF 35-40%)  . CHF (congestive heart failure) ([place])   . COPD (chronic obstructive pulmonary disease) ([place])   . Depression   . Dyspnea   . GERD (gastroesophageal reflux disease)   . Hyperlipidemia   . Hypertension   . Pneumonia   . PVC's (premature ventricular contractions)    states 10 years ago  . Sepsis ([place]) 06/30/2014  . SOB (shortness of breath) 05/08/2014  . Spinal cord injury at T7-T12 level [facility])    2016    PAST SURGICAL HISTORY: Past Surgical History:  Procedure Laterality Date  . APPENDECTOMY    . CARDIAC CATHETERIZATION    . CATARACT EXTRACTION     right eye   . CESAREAN SECTION    . COLONOSCOPY    . LAMINOTOMY    . LUMBAR LAMINECTOMY/DECOMPRESSION MICRODISCECTOMY Left 08/01/2016   Procedure: Left Lumbar Four-Five Laminectomy and Foraminotomy;  Surgeon: Earnie Larsson, MD;  Location: [place];  Service: Neurosurgery;  Laterality: Left;  . PORTA CATH INSERTION N/A 02/03/2017   Procedure: PORTA CATH INSERTION;  Surgeon: Katha Cabal, MD;  Location: [place] [facility];  Service: Cardiovascular;  Laterality: N/A;    FAMILY HISTORY: Family History  Problem Relation Age of Onset  . Heart attack Father 79       MI x 3     ADVANCED DIRECTIVES (Y/N):  N  HEALTH MAINTENANCE: Social History   Tobacco Use  . Smoking status: Former Smoker    Packs/day: 0.50  Years: 50.00    Pack years: 25.00    Types: Cigarettes    Last attempt to quit: 01/27/2015    Years since quitting: 2.4  . Smokeless tobacco: Never Used  Substance Use Topics  . Alcohol use: No  . Drug use: No     Colonoscopy:  PAP:  Bone density:  Lipid panel:  Allergies  Allergen Reactions  . Fish Allergy Anaphylaxis    Per pt after eating [facility] she had throat swelling and SOB. MSY   . Fentanyl     Overly sedated and groggy, ineffective     Current Outpatient  Medications  Medication Sig Dispense Refill  . ADVAIR DISKUS 250-50 MCG/DOSE AEPB Inhale 1 puff into the lungs 2 (two) times daily.    Marland Kitchen alendronate (FOSAMAX) 70 MG tablet Take 70 mg by mouth every Thursday.     . ARIPiprazole (ABILIFY) 5 MG tablet Take 5 mg by mouth daily.    . benzonatate (TESSALON) 100 MG capsule Take 1 capsule (100 mg total) by mouth every 8 (eight) hours as needed for cough. 20 capsule 0  . Calcium Citrate-Vitamin D (CALCIUM + D PO) Take 1 tablet by mouth 2 (two) times daily.    . ciprofloxacin (CIPRO) 500 MG tablet Take 1 tablet (500 mg total) by mouth 2 (two) times daily. 7 tablet 0  . DULoxetine (CYMBALTA) 60 MG capsule Take 60 mg by mouth daily.    Marland Kitchen ipratropium-albuterol (DUONEB) 0.5-2.5 (3) MG/3ML SOLN Take 3 mLs by nebulization every 4 (four) hours as needed. 360 mL 1  . metoprolol succinate (TOPROL-XL) 100 MG 24 hr tablet TAKE ONE TABLET BY MOUTH ONCE DAILY 30 tablet 0  . oxyCODONE-acetaminophen (PERCOCET/ROXICET) 5-325 MG tablet Take 1-2 tablets by mouth every 6 (six) hours as needed. For pain. 120 tablet 0  . pantoprazole (PROTONIX) 40 MG tablet Take 1 tablet by mouth daily.    . potassium chloride SA (K-DUR,KLOR-CON) 20 MEQ tablet Take 1 tablet (20 mEq total) by mouth daily. 30 tablet 2  . promethazine (PHENERGAN) 25 MG tablet Take 25 mg by mouth every 8 (eight) hours as needed for nausea or vomiting.    . sacubitril-valsartan (ENTRESTO) 49-51 MG Takes 1 1/2 tablet by mouth twice daily. (Patient taking differently: Takes 1 tablet in AM and 1/2 tablet in PM) 45 tablet 5  . albuterol (PROAIR HFA) 108 (90 Base) MCG/ACT inhaler Inhale 2 puffs into the lungs every 6 (six) hours as needed for wheezing or shortness of breath.     . diphenoxylate-atropine (LOMOTIL) 2.5-0.025 MG tablet Take 1 tablet by mouth 4 (four) times daily as needed for diarrhea or loose stools. (Patient not taking: Reported on 06/21/2017) 30 tablet 0  . LORazepam (ATIVAN) 0.5 MG tablet Take 0.5 mg by  mouth as needed.     No current facility-administered medications for this visit.    Facility-Administered Medications Ordered in Other Visits  Medication Dose Route Frequency Provider Last Rate Last Dose  . 0.9 %  sodium chloride infusion   Intravenous Once Lloyd Huger, MD      . [facility]) 1,200 mg in sodium chloride 0.9 % 250 mL chemo infusion  1,200 mg Intravenous Once Lloyd Huger, MD      . heparin lock flush 100 unit/mL  500 Units Intracatheter PRN Lloyd Huger, MD      . heparin lock flush 100 unit/mL  500 Units Intravenous Once Lloyd Huger, MD      .  sodium chloride flush (NS) 0.9 % injection 10 mL  10 mL Intravenous PRN Lloyd Huger, MD   10 mL at 03/01/17 0841  . sodium chloride flush (NS) 0.9 % injection 10 mL  10 mL Intravenous PRN Lloyd Huger, MD   10 mL at 06/21/17 0931    OBJECTIVE: Vitals:   06/21/17 0942  BP: 96/68  Pulse: 76  Resp: 18  Temp: (!) 94.8 F (34.9 C)     There is no height or weight on file to calculate BMI.    ECOG FS:1 - Symptomatic but completely ambulatory  General: Well-developed, well-nourished, no acute distress.  Sitting in a wheelchair. Eyes: Pink conjunctiva, anicteric sclera. Lungs: Clear to auscultation bilaterally. Heart: Regular rate and rhythm. No rubs, murmurs, or gallops. Abdomen: Soft, nontender, nondistended. No organomegaly noted, normoactive bowel sounds. Musculoskeletal: No edema, cyanosis, or clubbing. Neuro: Alert, answering all questions appropriately. Cranial nerves grossly intact. Skin: No rashes or petechiae noted. Psych: Normal affect.  LAB RESULTS:  Lab Results  Component Value Date   NA 139 06/21/2017   K 3.5 06/21/2017   CL 104 06/21/2017   CO2 24 06/21/2017   GLUCOSE 113 (H) 06/21/2017   BUN 10 06/21/2017   CREATININE 0.85 06/21/2017   CALCIUM 8.1 (L) 06/21/2017   PROT 7.3 06/21/2017   ALBUMIN 3.2 (L) 06/21/2017   AST 25 06/21/2017   ALT 16  06/21/2017   ALKPHOS 66 06/21/2017   BILITOT 0.4 06/21/2017   GFRNONAA >60 06/21/2017   GFRAA >60 06/21/2017    Lab Results  Component Value Date   WBC 7.0 06/21/2017   NEUTROABS 4.6 06/21/2017   HGB 7.9 (L) 06/21/2017   HCT 22.4 (L) 06/21/2017   MCV 98.5 06/21/2017   PLT 133 (L) 06/21/2017     STUDIES: Ct Chest W Contrast  Result Date: 06/14/2017 CLINICAL DATA:  Extensive stage small cell left lung cancer. Restaging with ongoing chemotherapy. EXAM: CT CHEST, ABDOMEN, AND PELVIS WITH CONTRAST TECHNIQUE: Multidetector CT imaging of the chest, abdomen and pelvis was performed following the standard protocol during bolus administration of intravenous contrast. CONTRAST:  123mL OMNIPAQUE IOHEXOL 300 MG/ML  SOLN COMPARISON:  04/04/2017 CT chest, abdomen and pelvis. FINDINGS: CT CHEST FINDINGS Cardiovascular: Normal heart size. No significant pericardial fluid/thickening. Right internal jugular MediPort terminates at the cavoatrial junction. Left main and 3 vessel coronary atherosclerosis. Atherosclerotic nonaneurysmal thoracic aorta. Normal caliber pulmonary arteries. No central pulmonary emboli. Mediastinum/Nodes: No discrete thyroid nodules. Unremarkable esophagus. No axillary adenopathy. No pathologically enlarged mediastinal or hilar nodes. Previously described mildly enlarged 1.0 cm right hilar node is decreased to 0.6 cm (series 2/image 25). Previously described mildly enlarged 1.0 cm upper right paratracheal node is decreased to 0.7 cm (series 2/image 12). Lungs/Pleura: No pneumothorax. No pleural effusion. Central left upper lobe pulmonary nodule is decreased, currently 1.7 x 1.3 cm (series 4/image 51), previously 2.3 x 2.1 cm using similar measurement technique. Thick parenchymal band in the left upper lobe is significantly decreased in thickness, compatible with evolving postinflammatory scarring. No acute consolidative airspace disease. The previously described variably cavitary irregular  sub solid pulmonary nodules scattered throughout the right lung have significantly decreased in size. Representative 0.9 cm right lower lobe nodule (series 4/image 69), decreased from 1.8 cm. Separate 1.3 cm right lower lobe nodule (series 4/image 87), decreased from 2.2 cm. Right upper lobe 0.3 cm nodule (series 4/image 29), decreased from 0.9 cm. No new or enlarging pulmonary nodules. Musculoskeletal: Healing subacute posterior right eighth and  posterolateral left seventh rib fractures. Stable severe T5 and mild-to-moderate T7 vertebral compression fractures. Numerous sclerotic osseous lesions throughout the thoracic skeleton, most prominent throughout the thoracic vertebral bodies are slightly increased in sclerosis and not appreciably changed in size or number. No appreciable new thoracic osseous lesions. Moderate thoracic spondylosis. CT ABDOMEN PELVIS FINDINGS Hepatobiliary: Numerous (greater than 6) hypodense liver masses have all decreased in size, with representative masses as follows: -anterior segment 4 left liver lobe 3.7 x 2.2 cm mass (series 2/image 54), decreased from 4.0 x 3.2 cm -segment 3 left lower lobe 1.5 x 1.3 cm mass (series 2/image 57), decreased from 2.1 x 2.0 cm -segment 7 right liver lobe 1.3 x 1.2 cm mass (series 2/image 50), decreased from 1.8 x 1.3 cm No appreciable new or enlarging liver masses. Normal gallbladder with no radiopaque cholelithiasis. No intrahepatic biliary ductal dilatation. Stable dilated CBD (13 mm diameter) without obstructing stone or mass by CT. Pancreas: Normal, with no mass or duct dilation. Spleen: Normal size. No mass. Adrenals/Urinary Tract: Normal adrenals. No hydronephrosis. There is new patchy parenchymal enhancement throughout the mid to upper left kidney (series 6/image 14) without discrete renal mass. Collapsed bladder. New diffuse bladder wall thickening with perivesical fat stranding. Stomach/Bowel: Normal non-distended stomach. Normal caliber small  bowel with no small bowel wall thickening. Appendectomy. Mild sigmoid diverticulosis, with no large bowel wall thickening or significant pericolonic fat stranding. Vascular/Lymphatic: Atherosclerotic nonaneurysmal abdominal aorta. Patent portal, splenic, hepatic and renal veins. No pathologically enlarged lymph nodes in the abdomen or pelvis. Reproductive: Grossly normal uterus.  No adnexal mass. Other: No pneumoperitoneum, ascites or focal fluid collection. Musculoskeletal: Small sclerotic lesions throughout the lumbar spine, sacrum and bilateral pelvic girdle are slightly increased in sclerosis without appreciable changed in lesion size or number. Marked lumbar spondylosis. IMPRESSION: 1. New diffuse bladder wall thickening with perivesical fat stranding suggesting acute cystitis. Recommend correlation with urinalysis. 2. New patchy parenchymal enhancement throughout the mid to upper left kidney worrisome for acute left pyelonephritis. No evidence of renal abscess. 3. Interval positive treatment response. Primary tumor in the central left upper lung lobe is decreased. Thoracic adenopathy is decreased. Cavitary pulmonary nodules in the right lung are all decreased. Liver metastases are decreased. 4. Faintly sclerotic osseous metastases throughout the visualized skeleton, slightly increased in sclerosis, which may be due to treatment response. No convincing new or progressive metastatic disease. 5. Healing subacute bilateral rib fractures. These results will be called to the ordering clinician or representative by the Radiologist Assistant, and communication documented in the PACS or zVision Dashboard. Electronically Signed   By: Ilona Sorrel M.D.   On: 06/14/2017 13:05   Ct Abdomen Pelvis W Contrast  Result Date: 06/14/2017 CLINICAL DATA:  Extensive stage small cell left lung cancer. Restaging with ongoing chemotherapy. EXAM: CT CHEST, ABDOMEN, AND PELVIS WITH CONTRAST TECHNIQUE: Multidetector CT imaging of the  chest, abdomen and pelvis was performed following the standard protocol during bolus administration of intravenous contrast. CONTRAST:  150mL OMNIPAQUE IOHEXOL 300 MG/ML  SOLN COMPARISON:  04/04/2017 CT chest, abdomen and pelvis. FINDINGS: CT CHEST FINDINGS Cardiovascular: Normal heart size. No significant pericardial fluid/thickening. Right internal jugular MediPort terminates at the cavoatrial junction. Left main and 3 vessel coronary atherosclerosis. Atherosclerotic nonaneurysmal thoracic aorta. Normal caliber pulmonary arteries. No central pulmonary emboli. Mediastinum/Nodes: No discrete thyroid nodules. Unremarkable esophagus. No axillary adenopathy. No pathologically enlarged mediastinal or hilar nodes. Previously described mildly enlarged 1.0 cm right hilar node is decreased to 0.6 cm (series 2/image  25). Previously described mildly enlarged 1.0 cm upper right paratracheal node is decreased to 0.7 cm (series 2/image 12). Lungs/Pleura: No pneumothorax. No pleural effusion. Central left upper lobe pulmonary nodule is decreased, currently 1.7 x 1.3 cm (series 4/image 51), previously 2.3 x 2.1 cm using similar measurement technique. Thick parenchymal band in the left upper lobe is significantly decreased in thickness, compatible with evolving postinflammatory scarring. No acute consolidative airspace disease. The previously described variably cavitary irregular sub solid pulmonary nodules scattered throughout the right lung have significantly decreased in size. Representative 0.9 cm right lower lobe nodule (series 4/image 69), decreased from 1.8 cm. Separate 1.3 cm right lower lobe nodule (series 4/image 87), decreased from 2.2 cm. Right upper lobe 0.3 cm nodule (series 4/image 29), decreased from 0.9 cm. No new or enlarging pulmonary nodules. Musculoskeletal: Healing subacute posterior right eighth and posterolateral left seventh rib fractures. Stable severe T5 and mild-to-moderate T7 vertebral compression  fractures. Numerous sclerotic osseous lesions throughout the thoracic skeleton, most prominent throughout the thoracic vertebral bodies are slightly increased in sclerosis and not appreciably changed in size or number. No appreciable new thoracic osseous lesions. Moderate thoracic spondylosis. CT ABDOMEN PELVIS FINDINGS Hepatobiliary: Numerous (greater than 6) hypodense liver masses have all decreased in size, with representative masses as follows: -anterior segment 4 left liver lobe 3.7 x 2.2 cm mass (series 2/image 54), decreased from 4.0 x 3.2 cm -segment 3 left lower lobe 1.5 x 1.3 cm mass (series 2/image 57), decreased from 2.1 x 2.0 cm -segment 7 right liver lobe 1.3 x 1.2 cm mass (series 2/image 50), decreased from 1.8 x 1.3 cm No appreciable new or enlarging liver masses. Normal gallbladder with no radiopaque cholelithiasis. No intrahepatic biliary ductal dilatation. Stable dilated CBD (13 mm diameter) without obstructing stone or mass by CT. Pancreas: Normal, with no mass or duct dilation. Spleen: Normal size. No mass. Adrenals/Urinary Tract: Normal adrenals. No hydronephrosis. There is new patchy parenchymal enhancement throughout the mid to upper left kidney (series 6/image 14) without discrete renal mass. Collapsed bladder. New diffuse bladder wall thickening with perivesical fat stranding. Stomach/Bowel: Normal non-distended stomach. Normal caliber small bowel with no small bowel wall thickening. Appendectomy. Mild sigmoid diverticulosis, with no large bowel wall thickening or significant pericolonic fat stranding. Vascular/Lymphatic: Atherosclerotic nonaneurysmal abdominal aorta. Patent portal, splenic, hepatic and renal veins. No pathologically enlarged lymph nodes in the abdomen or pelvis. Reproductive: Grossly normal uterus.  No adnexal mass. Other: No pneumoperitoneum, ascites or focal fluid collection. Musculoskeletal: Small sclerotic lesions throughout the lumbar spine, sacrum and bilateral  pelvic girdle are slightly increased in sclerosis without appreciable changed in lesion size or number. Marked lumbar spondylosis. IMPRESSION: 1. New diffuse bladder wall thickening with perivesical fat stranding suggesting acute cystitis. Recommend correlation with urinalysis. 2. New patchy parenchymal enhancement throughout the mid to upper left kidney worrisome for acute left pyelonephritis. No evidence of renal abscess. 3. Interval positive treatment response. Primary tumor in the central left upper lung lobe is decreased. Thoracic adenopathy is decreased. Cavitary pulmonary nodules in the right lung are all decreased. Liver metastases are decreased. 4. Faintly sclerotic osseous metastases throughout the visualized skeleton, slightly increased in sclerosis, which may be due to treatment response. No convincing new or progressive metastatic disease. 5. Healing subacute bilateral rib fractures. These results will be called to the ordering clinician or representative by the Radiologist Assistant, and communication documented in the PACS or zVision Dashboard. Electronically Signed   By: Ilona Sorrel M.D.   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  PLAN:  1. 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 MRI of the brain on February 04, 2017 did not reveal any metastatic disease.  Proceed to cycle 7 of maintenance Tecentriq today.  Patient will also receive Zometa.  Return to clinic in 3 weeks for further evaluation and consideration of cycle 8.   2. Pain: Chronic and unchanged.  Patient states long-acting narcotics are too expensive.  Continue Percocet as prescribed.   3.  Anemia: Decreased, but stable.  Patient does not require blood transfusion today.  Secondary to chemotherapy, monitor. 4.  COPD: Continue  outpatient palliative care.  Continue current medications as prescribed. 5.  Disposition: Hospice and end-of-life care were previously discussed, but patient does not wish to pursue this option at this time.  She confirms that she is a DNR and is having outpatient palliative care visit. 6.  Diarrhea: Patient does not complain of this today.  Continue Lomotil as needed. 7.  Hypokalemia: Resolved.  Continue oral supplementation as prescribed.    8.  UTI: Continue Cipro as prescribed.  Will repeat UA today to ensure resolution.  Patient expressed understanding and was in agreement with this plan. She also understands that She can call clinic at any time with any questions, concerns, or complaints.   Cancer Staging Small cell lung cancer [facility]) Staging form: Lung, AJCC 8th Edition - Clinical stage from 01/28/2017: Stage IV (cT4, cN3, pM1c) - Signed by Lloyd Huger, MD on 01/28/2017   Lloyd Huger, MD   06/21/2017 10:40 AM

## 2017-06-20 ENCOUNTER — Other Ambulatory Visit: Payer: Self-pay | Admitting: Oncology

## 2017-06-21 ENCOUNTER — Inpatient Hospital Stay (HOSPITAL_BASED_OUTPATIENT_CLINIC_OR_DEPARTMENT_OTHER): Payer: Medicare Other | Admitting: Oncology

## 2017-06-21 ENCOUNTER — Other Ambulatory Visit: Payer: Self-pay

## 2017-06-21 ENCOUNTER — Inpatient Hospital Stay: Payer: Medicare Other

## 2017-06-21 VITALS — BP 96/68 | HR 76 | Temp 94.8°F | Resp 18

## 2017-06-21 DIAGNOSIS — C3490 Malignant neoplasm of unspecified part of unspecified bronchus or lung: Secondary | ICD-10-CM

## 2017-06-21 DIAGNOSIS — D649 Anemia, unspecified: Secondary | ICD-10-CM

## 2017-06-21 DIAGNOSIS — C3412 Malignant neoplasm of upper lobe, left bronchus or lung: Secondary | ICD-10-CM

## 2017-06-21 DIAGNOSIS — Z5112 Encounter for antineoplastic immunotherapy: Secondary | ICD-10-CM | POA: Diagnosis not present

## 2017-06-21 DIAGNOSIS — N390 Urinary tract infection, site not specified: Secondary | ICD-10-CM

## 2017-06-21 DIAGNOSIS — R109 Unspecified abdominal pain: Secondary | ICD-10-CM

## 2017-06-21 DIAGNOSIS — C7951 Secondary malignant neoplasm of bone: Secondary | ICD-10-CM

## 2017-06-21 DIAGNOSIS — C778 Secondary and unspecified malignant neoplasm of lymph nodes of multiple regions: Secondary | ICD-10-CM | POA: Diagnosis not present

## 2017-06-21 DIAGNOSIS — C787 Secondary malignant neoplasm of liver and intrahepatic bile duct: Secondary | ICD-10-CM

## 2017-06-21 DIAGNOSIS — E860 Dehydration: Secondary | ICD-10-CM | POA: Diagnosis not present

## 2017-06-21 DIAGNOSIS — Z87891 Personal history of nicotine dependence: Secondary | ICD-10-CM

## 2017-06-21 LAB — COMPREHENSIVE METABOLIC PANEL
ALBUMIN: 3.2 g/dL — AB (ref 3.5–5.0)
ALK PHOS: 66 U/L (ref 38–126)
ALT: 16 U/L (ref 14–54)
AST: 25 U/L (ref 15–41)
Anion gap: 11 (ref 5–15)
BILIRUBIN TOTAL: 0.4 mg/dL (ref 0.3–1.2)
BUN: 10 mg/dL (ref 6–20)
CHLORIDE: 104 mmol/L (ref 101–111)
CO2: 24 mmol/L (ref 22–32)
Calcium: 8.1 mg/dL — ABNORMAL LOW (ref 8.9–10.3)
Creatinine, Ser: 0.85 mg/dL (ref 0.44–1.00)
GFR calc Af Amer: >60 mL/min (ref >60)
GFR calc non Af Amer: >60 mL/min (ref >60)
Glucose, Bld: 113 mg/dL — ABNORMAL HIGH (ref 65–99)
POTASSIUM: 3.5 mmol/L (ref 3.5–5.1)
SODIUM: 139 mmol/L (ref 135–145)
Total Protein: 7.3 g/dL (ref 6.5–8.1)

## 2017-06-21 LAB — URINALYSIS, COMPLETE (UACMP) WITH MICROSCOPIC
BACTERIA UA: NONE SEEN
BILIRUBIN URINE: NEGATIVE
Glucose, UA: NEGATIVE mg/dL
HGB URINE DIPSTICK: NEGATIVE
Ketones, ur: NEGATIVE mg/dL
LEUKOCYTES UA: NEGATIVE
NITRITE: NEGATIVE
Protein, ur: NEGATIVE mg/dL
SPECIFIC GRAVITY, URINE: 1.014 (ref 1.005–1.030)
pH: 6 (ref 5.0–8.0)

## 2017-06-21 LAB — CBC WITH DIFFERENTIAL/PLATELET
BASOS PCT: 1 %
Basophils Absolute: 0 10*3/uL (ref 0–0.1)
EOS ABS: 0 10*3/uL (ref 0–0.7)
Eosinophils Relative: 0 %
HEMATOCRIT: 22.4 % — AB (ref 35.0–47.0)
HEMOGLOBIN: 7.9 g/dL — AB (ref 12.0–16.0)
LYMPHS ABS: 1.7 10*3/uL (ref 1.0–3.6)
Lymphocytes Relative: 24 %
MCH: 34.6 pg — AB (ref 26.0–34.0)
MCHC: 35.1 g/dL (ref 32.0–36.0)
MCV: 98.5 fL (ref 80.0–100.0)
MONOS PCT: 10 %
Monocytes Absolute: 0.7 10*3/uL (ref 0.2–0.9)
NEUTROS ABS: 4.6 10*3/uL (ref 1.4–6.5)
Neutrophils Relative %: 65 %
Platelets: 133 10*3/uL — ABNORMAL LOW (ref 150–440)
RBC: 2.27 MIL/uL — AB (ref 3.80–5.20)
RDW: 16.8 % — ABNORMAL HIGH (ref 11.5–14.5)
WBC: 7 10*3/uL (ref 3.6–11.0)

## 2017-06-21 MED ORDER — ZOLEDRONIC ACID 4 MG/100ML IV SOLN
4.0000 mg | Freq: Once | INTRAVENOUS | Status: AC
  Administered 2017-06-21: 4 mg via INTRAVENOUS
  Filled 2017-06-21: qty 100

## 2017-06-21 MED ORDER — SODIUM CHLORIDE 0.9 % IV SOLN
1200.0000 mg | Freq: Once | INTRAVENOUS | Status: AC
  Administered 2017-06-21: 1200 mg via INTRAVENOUS
  Filled 2017-06-21: qty 20

## 2017-06-21 MED ORDER — SODIUM CHLORIDE 0.9 % IV SOLN
Freq: Once | INTRAVENOUS | Status: AC
  Filled 2017-06-21: qty 1000

## 2017-06-21 MED ORDER — SODIUM CHLORIDE 0.9% FLUSH
10.0000 mL | INTRAVENOUS | Status: DC | PRN
  Administered 2017-06-21: 10 mL via INTRAVENOUS
  Filled 2017-06-21: qty 10

## 2017-06-21 MED ORDER — SODIUM CHLORIDE 0.9 % IV SOLN
Freq: Once | INTRAVENOUS | Status: AC
  Administered 2017-06-21: 11:00:00 via INTRAVENOUS
  Filled 2017-06-21: qty 1000

## 2017-06-21 MED ORDER — HEPARIN SOD (PORK) LOCK FLUSH 100 UNIT/ML IV SOLN
500.0000 [IU] | Freq: Once | INTRAVENOUS | Status: AC
  Administered 2017-06-21: 500 [IU] via INTRAVENOUS
  Filled 2017-06-21: qty 5

## 2017-06-21 NOTE — Progress Notes (Signed)
hbg 7.9 today. Per Dr Grayland Ormond proceed with tecentriq today

## 2017-06-21 NOTE — Addendum Note (Signed)
Addended by: Levada Schilling on: 06/21/2017 11:01 AM   Modules accepted: Orders

## 2017-06-21 NOTE — Progress Notes (Signed)
Here for follow up. In general per pt " feeling ok "

## 2017-06-22 LAB — URINE CULTURE

## 2017-06-26 LAB — THYROID PANEL WITH TSH
FREE THYROXINE INDEX: 1.8 (ref 1.2–4.9)
T3 Uptake Ratio: 26 % (ref 24–39)
T4, Total: 6.9 ug/dL (ref 4.5–12.0)
TSH: 9.15 u[IU]/mL — ABNORMAL HIGH (ref 0.450–4.500)

## 2017-07-07 ENCOUNTER — Other Ambulatory Visit: Payer: Self-pay | Admitting: *Deleted

## 2017-07-07 MED ORDER — OXYCODONE-ACETAMINOPHEN 5-325 MG PO TABS: 1.0000 | ORAL_TABLET | Freq: Four times a day (QID) | ORAL | 0 refills | Status: DC | PRN

## 2017-07-10 NOTE — Progress Notes (Signed)
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  INTERVAL HISTORY: Patient returns to clinic today for further evaluation and continuation of maintenance Tecentriq.  She admits to intermittent nausea and vomiting, that appears to be unrelated to her treatments.  She otherwise feels well and is asymptomatic.  She continues to have chronic pain, but this is well controlled with her current narcotics. She has no neurologic complaints.  She denies any fevers.  She has a fair appetite and denies weight loss.  She has chronic shortness of breath, but denies any chest pain or hemoptysis. She has no abdominal pain.  She denies any melena or hematochezia.  Patient offers no further specific complaints today.  REVIEW OF SYSTEMS:   Review of Systems  Constitutional: Positive for malaise/fatigue. Negative for fever and weight loss.  Respiratory: Positive for shortness of breath. Negative for cough, hemoptysis and wheezing.   Cardiovascular: Negative.  Negative for chest pain and leg swelling.  Gastrointestinal: Positive for nausea and vomiting. Negative for abdominal pain, blood in stool, diarrhea and melena.  Genitourinary: Negative.  Negative for dysuria and frequency.  Musculoskeletal: Positive for back pain, joint pain and neck pain.  Skin: Negative.  Negative for rash.  Neurological: Positive for weakness. Negative for sensory change and focal weakness.  Psychiatric/Behavioral: Negative.  The patient is not nervous/anxious.     As per HPI. Otherwise, a complete review of systems is negative.  PAST MEDICAL HISTORY: Past Medical History:  Diagnosis Date  . Anxiety   . Arthritis   . Asthma    COPD  .  Cancer ([place])    Liver  . CAP (community acquired pneumonia) 06/30/2014  . Cardiomyopathy ([place])    nonischemic (EF 35-40%)  . CHF (congestive heart failure) ([place])   . COPD (chronic obstructive pulmonary disease) ([place])   . Depression   . Dyspnea   . GERD (gastroesophageal reflux disease)   . Hyperlipidemia   . Hypertension   . Pneumonia   . PVC's (premature ventricular contractions)    states 10 years ago  . Sepsis ([place]) 06/30/2014  . SOB (shortness of breath) 05/08/2014  . Spinal cord injury at T7-T12 level [facility])    2016    PAST SURGICAL HISTORY: Past Surgical History:  Procedure Laterality Date  . APPENDECTOMY    . CARDIAC CATHETERIZATION    . CATARACT EXTRACTION     right eye   . CESAREAN SECTION    . COLONOSCOPY    . LAMINOTOMY    . LUMBAR LAMINECTOMY/DECOMPRESSION MICRODISCECTOMY Left 08/01/2016   Procedure: Left Lumbar Four-Five Laminectomy and Foraminotomy;  Surgeon: Earnie Larsson, MD;  Location: [place];  Service: Neurosurgery;  Laterality: Left;  . PORTA CATH INSERTION N/A 02/03/2017   Procedure: PORTA CATH INSERTION;  Surgeon: Katha Cabal, MD;  Location: [place] [facility];  Service: Cardiovascular;  Laterality: N/A;    FAMILY HISTORY: Family History  Problem Relation Age of Onset  . Heart attack Father 49       MI x 3     ADVANCED DIRECTIVES (Y/N):  N  HEALTH MAINTENANCE: Social History   Tobacco Use  . Smoking status: Former Smoker    Packs/day: 0.50    Years: 50.00  Pack years: 25.00    Types: Cigarettes    Last attempt to quit: 01/27/2015    Years since quitting: 2.4  . Smokeless tobacco: Never Used  Substance Use Topics  . Alcohol use: No  . Drug use: No     Colonoscopy:  PAP:  Bone density:  Lipid panel:  Allergies  Allergen Reactions  . Fish Allergy Anaphylaxis    Per pt after eating [facility] she had throat swelling and SOB. MSY   . Fentanyl     Overly sedated and groggy, ineffective     Current Outpatient Medications    Medication Sig Dispense Refill  . ADVAIR DISKUS 250-50 MCG/DOSE AEPB Inhale 1 puff into the lungs 2 (two) times daily.    Marland Kitchen albuterol (PROAIR HFA) 108 (90 Base) MCG/ACT inhaler Inhale 2 puffs into the lungs every 6 (six) hours as needed for wheezing or shortness of breath.     Marland Kitchen alendronate (FOSAMAX) 70 MG tablet Take 70 mg by mouth every Thursday.     . ARIPiprazole (ABILIFY) 5 MG tablet Take 5 mg by mouth daily.    . Calcium Citrate-Vitamin D (CALCIUM + D PO) Take 1 tablet by mouth 2 (two) times daily.    . DULoxetine (CYMBALTA) 60 MG capsule Take 60 mg by mouth daily.    Marland Kitchen ipratropium-albuterol (DUONEB) 0.5-2.5 (3) MG/3ML SOLN Take 3 mLs by nebulization every 4 (four) hours as needed. 360 mL 1  . LORazepam (ATIVAN) 0.5 MG tablet Take 0.5 mg by mouth as needed.    . metoprolol succinate (TOPROL-XL) 100 MG 24 hr tablet TAKE ONE TABLET BY MOUTH ONCE DAILY 30 tablet 0  . pantoprazole (PROTONIX) 40 MG tablet Take 1 tablet by mouth daily.    . potassium chloride SA (K-DUR,KLOR-CON) 20 MEQ tablet Take 1 tablet (20 mEq total) by mouth daily. 30 tablet 2  . sacubitril-valsartan (ENTRESTO) 49-51 MG Takes 1 1/2 tablet by mouth twice daily. (Patient taking differently: Takes 1 tablet in AM and 1/2 tablet in PM) 45 tablet 5  . benzonatate (TESSALON) 100 MG capsule Take 1 capsule (100 mg total) by mouth every 8 (eight) hours as needed for cough. (Patient not taking: Reported on 07/12/2017) 20 capsule 0  . diphenoxylate-atropine (LOMOTIL) 2.5-0.025 MG tablet Take 1 tablet by mouth 4 (four) times daily as needed for diarrhea or loose stools. (Patient not taking: Reported on 06/21/2017) 30 tablet 0  . oxyCODONE-acetaminophen (PERCOCET/ROXICET) 5-325 MG tablet Take 1-2 tablets by mouth every 6 (six) hours as needed. For pain. (Patient not taking: Reported on 07/12/2017) 120 tablet 0  . promethazine (PHENERGAN) 25 MG tablet Take 25 mg by mouth every 8 (eight) hours as needed for nausea or vomiting.     No current  facility-administered medications for this visit.    Facility-Administered Medications Ordered in Other Visits  Medication Dose Route Frequency Provider Last Rate Last Dose  . heparin lock flush 100 unit/mL  500 Units Intracatheter PRN Lloyd Huger, MD      . sodium chloride flush (NS) 0.9 % injection 10 mL  10 mL Intravenous PRN Lloyd Huger, MD   10 mL at 03/01/17 0841    OBJECTIVE: Vitals:   07/12/17 1007  BP: (!) 84/60  Pulse: (!) 103  Resp: 18  Temp: (!) 95.8 F (35.4 C)     There is no height or weight on file to calculate BMI.    ECOG FS:1 - Symptomatic but completely ambulatory  General: Well-developed, well-nourished, no  acute distress.  Sitting in a wheelchair. Eyes: Pink conjunctiva, anicteric sclera. Lungs: Clear to auscultation bilaterally. Heart: Regular rate and rhythm. No rubs, murmurs, or gallops. Abdomen: Soft, nontender, nondistended. No organomegaly noted, normoactive bowel sounds. Musculoskeletal: No edema, cyanosis, or clubbing. Neuro: Alert, answering all questions appropriately. Cranial nerves grossly intact. Skin: No rashes or petechiae noted. Psych: Normal affect.  LAB RESULTS:  Lab Results  Component Value Date   NA 136 07/12/2017   K 3.2 (L) 07/12/2017   CL 106 07/12/2017   CO2 21 (L) 07/12/2017   GLUCOSE 139 (H) 07/12/2017   BUN 21 (H) 07/12/2017   CREATININE 1.00 07/12/2017   CALCIUM 8.1 (L) 07/12/2017   PROT 8.0 07/12/2017   ALBUMIN 3.8 07/12/2017   AST 25 07/12/2017   ALT 19 07/12/2017   ALKPHOS 52 07/12/2017   BILITOT 0.3 07/12/2017   GFRNONAA 57 (L) 07/12/2017   GFRAA >60 07/12/2017    Lab Results  Component Value Date   WBC 9.2 07/12/2017   NEUTROABS 6.6 (H) 07/12/2017   HGB 10.4 (L) 07/12/2017   HCT 30.2 (L) 07/12/2017   MCV 96.7 07/12/2017   PLT 260 07/12/2017     STUDIES: No results found.  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  PLAN:  1. Stage IV small cell  lung cancer with liver and bone metastasis: CT scan results from Jun 14, 2017 reviewed independently with overall improvement of patient's disease. MRI of the brain on February 04, 2017 did not reveal any metastatic disease.  Proceed with cycle 8 of maintenance Tecentriq today.  Patient receives Zometa on her odd-numbered cycles.  Return to clinic in 3 weeks for further evaluation and consideration of cycle 9.   2. Pain: Chronic and unchanged.  Patient states long-acting narcotics are too expensive.  Continue Percocet as prescribed.   3.  Anemia: Decreased, but stable.  Patient does not require blood transfusion today.  Secondary to chemotherapy, monitor. 4.  COPD: Continue outpatient palliative care.  Continue current medications as prescribed. 5.  Disposition: Hospice and end-of-life care were previously discussed, but patient does not wish to pursue this option at this time.  She confirms that she is a DNR and is having outpatient palliative care visit. 6.  Diarrhea: Resolved.  Continue Lomotil as needed. 7.  Hypokalemia: Potassium slightly decreased today.  Continue oral supplementation as prescribed.    8.  Nausea: Continue Compazine as needed.  Approximately 30 minutes was spent in discussion of which greater than 50% was consultation.  Patient expressed understanding and was in agreement with this plan. She also understands that She can call clinic at any time with any questions, concerns, or complaints.   Cancer Staging Small cell lung cancer [facility]) Staging form: Lung, AJCC 8th Edition - Clinical stage from 01/28/2017: Stage IV (cT4, cN3, pM1c) - Signed by Lloyd Huger, MD on 01/28/2017   Lloyd Huger, MD   07/15/2017 4:38 PM

## 2017-07-11 ENCOUNTER — Other Ambulatory Visit: Payer: Self-pay | Admitting: *Deleted

## 2017-07-11 DIAGNOSIS — C3490 Malignant neoplasm of unspecified part of unspecified bronchus or lung: Secondary | ICD-10-CM

## 2017-07-12 ENCOUNTER — Inpatient Hospital Stay: Payer: Medicare Other

## 2017-07-12 ENCOUNTER — Inpatient Hospital Stay (HOSPITAL_BASED_OUTPATIENT_CLINIC_OR_DEPARTMENT_OTHER): Payer: Medicare Other | Admitting: Oncology

## 2017-07-12 ENCOUNTER — Other Ambulatory Visit: Payer: Self-pay

## 2017-07-12 VITALS — BP 90/58 | HR 88 | Wt 202.0 lb

## 2017-07-12 VITALS — BP 84/60 | HR 103 | Temp 95.8°F | Resp 18

## 2017-07-12 DIAGNOSIS — R112 Nausea with vomiting, unspecified: Secondary | ICD-10-CM

## 2017-07-12 DIAGNOSIS — C778 Secondary and unspecified malignant neoplasm of lymph nodes of multiple regions: Secondary | ICD-10-CM | POA: Diagnosis not present

## 2017-07-12 DIAGNOSIS — C3490 Malignant neoplasm of unspecified part of unspecified bronchus or lung: Secondary | ICD-10-CM

## 2017-07-12 DIAGNOSIS — Z5112 Encounter for antineoplastic immunotherapy: Secondary | ICD-10-CM | POA: Diagnosis not present

## 2017-07-12 DIAGNOSIS — E860 Dehydration: Secondary | ICD-10-CM | POA: Diagnosis not present

## 2017-07-12 DIAGNOSIS — Z87891 Personal history of nicotine dependence: Secondary | ICD-10-CM

## 2017-07-12 DIAGNOSIS — C3412 Malignant neoplasm of upper lobe, left bronchus or lung: Secondary | ICD-10-CM

## 2017-07-12 DIAGNOSIS — D649 Anemia, unspecified: Secondary | ICD-10-CM

## 2017-07-12 DIAGNOSIS — C787 Secondary malignant neoplasm of liver and intrahepatic bile duct: Secondary | ICD-10-CM | POA: Diagnosis not present

## 2017-07-12 DIAGNOSIS — C7951 Secondary malignant neoplasm of bone: Secondary | ICD-10-CM | POA: Diagnosis not present

## 2017-07-12 LAB — COMPREHENSIVE METABOLIC PANEL
ALK PHOS: 52 U/L (ref 38–126)
ALT: 19 U/L (ref 14–54)
ANION GAP: 9 (ref 5–15)
AST: 25 U/L (ref 15–41)
Albumin: 3.8 g/dL (ref 3.5–5.0)
BILIRUBIN TOTAL: 0.3 mg/dL (ref 0.3–1.2)
BUN: 21 mg/dL — ABNORMAL HIGH (ref 6–20)
CALCIUM: 8.1 mg/dL — AB (ref 8.9–10.3)
CO2: 21 mmol/L — ABNORMAL LOW (ref 22–32)
Chloride: 106 mmol/L (ref 101–111)
Creatinine, Ser: 1 mg/dL (ref 0.44–1.00)
GFR calc non Af Amer: 57 mL/min — ABNORMAL LOW (ref >60)
Glucose, Bld: 139 mg/dL — ABNORMAL HIGH (ref 65–99)
POTASSIUM: 3.2 mmol/L — AB (ref 3.5–5.1)
Sodium: 136 mmol/L (ref 135–145)
TOTAL PROTEIN: 8 g/dL (ref 6.5–8.1)

## 2017-07-12 LAB — CBC WITH DIFFERENTIAL/PLATELET
BASOS ABS: 0.1 10*3/uL (ref 0–0.1)
BASOS PCT: 1 %
Eosinophils Absolute: 0.2 10*3/uL (ref 0–0.7)
Eosinophils Relative: 2 %
HEMATOCRIT: 30.2 % — AB (ref 35.0–47.0)
HEMOGLOBIN: 10.4 g/dL — AB (ref 12.0–16.0)
LYMPHS PCT: 18 %
Lymphs Abs: 1.6 10*3/uL (ref 1.0–3.6)
MCH: 33.2 pg (ref 26.0–34.0)
MCHC: 34.3 g/dL (ref 32.0–36.0)
MCV: 96.7 fL (ref 80.0–100.0)
Monocytes Absolute: 0.7 10*3/uL (ref 0.2–0.9)
Monocytes Relative: 7 %
NEUTROS ABS: 6.6 10*3/uL — AB (ref 1.4–6.5)
NEUTROS PCT: 72 %
Platelets: 260 10*3/uL (ref 150–440)
RBC: 3.12 MIL/uL — AB (ref 3.80–5.20)
RDW: 15.6 % — ABNORMAL HIGH (ref 11.5–14.5)
WBC: 9.2 10*3/uL (ref 3.6–11.0)

## 2017-07-12 MED ORDER — SODIUM CHLORIDE 0.9% FLUSH
10.0000 mL | INTRAVENOUS | Status: DC | PRN
  Administered 2017-07-12: 10 mL via INTRAVENOUS
  Filled 2017-07-12: qty 10

## 2017-07-12 MED ORDER — HEPARIN SOD (PORK) LOCK FLUSH 100 UNIT/ML IV SOLN
500.0000 [IU] | Freq: Once | INTRAVENOUS | Status: AC
  Administered 2017-07-12: 500 [IU] via INTRAVENOUS

## 2017-07-12 MED ORDER — SODIUM CHLORIDE 0.9 % IV SOLN
Freq: Once | INTRAVENOUS | Status: AC
  Administered 2017-07-12: 11:00:00 via INTRAVENOUS
  Filled 2017-07-12: qty 1000

## 2017-07-12 MED ORDER — SODIUM CHLORIDE 0.9 % IV SOLN
1200.0000 mg | Freq: Once | INTRAVENOUS | Status: AC
  Administered 2017-07-12: 1200 mg via INTRAVENOUS
  Filled 2017-07-12: qty 20

## 2017-07-12 NOTE — Progress Notes (Signed)
Pt here for follow up. Per pt vomiting intermittently. Last time she vomited was Monday 07/10/17 x 7 times. Took compazine-so helped but vomits every few days-unclear rational for vomiting. None today-feels good she stated. bp lower but pt stated asymptomatic.

## 2017-07-13 LAB — THYROID PANEL WITH TSH
FREE THYROXINE INDEX: 2 (ref 1.2–4.9)
T3 UPTAKE RATIO: 25 % (ref 24–39)
T4, Total: 8 ug/dL (ref 4.5–12.0)
TSH: 5.44 u[IU]/mL — ABNORMAL HIGH (ref 0.450–4.500)

## 2017-07-31 ENCOUNTER — Other Ambulatory Visit: Payer: Self-pay

## 2017-07-31 ENCOUNTER — Ambulatory Visit: Payer: Medicare Other | Attending: Pain Medicine | Admitting: Pain Medicine

## 2017-07-31 ENCOUNTER — Encounter: Payer: Self-pay | Admitting: Pain Medicine

## 2017-07-31 VITALS — BP 122/89 | HR 123 | Temp 97.8°F | Ht 65.0 in | Wt 202.0 lb

## 2017-07-31 DIAGNOSIS — Z79899 Other long term (current) drug therapy: Secondary | ICD-10-CM | POA: Insufficient documentation

## 2017-07-31 DIAGNOSIS — Z885 Allergy status to narcotic agent status: Secondary | ICD-10-CM | POA: Insufficient documentation

## 2017-07-31 DIAGNOSIS — E119 Type 2 diabetes mellitus without complications: Secondary | ICD-10-CM | POA: Insufficient documentation

## 2017-07-31 DIAGNOSIS — M48062 Spinal stenosis, lumbar region with neurogenic claudication: Secondary | ICD-10-CM | POA: Insufficient documentation

## 2017-07-31 DIAGNOSIS — M25562 Pain in left knee: Secondary | ICD-10-CM | POA: Insufficient documentation

## 2017-07-31 DIAGNOSIS — M25511 Pain in right shoulder: Secondary | ICD-10-CM

## 2017-07-31 DIAGNOSIS — G894 Chronic pain syndrome: Secondary | ICD-10-CM | POA: Insufficient documentation

## 2017-07-31 DIAGNOSIS — F329 Major depressive disorder, single episode, unspecified: Secondary | ICD-10-CM | POA: Diagnosis not present

## 2017-07-31 DIAGNOSIS — K219 Gastro-esophageal reflux disease without esophagitis: Secondary | ICD-10-CM | POA: Insufficient documentation

## 2017-07-31 DIAGNOSIS — J449 Chronic obstructive pulmonary disease, unspecified: Secondary | ICD-10-CM | POA: Diagnosis not present

## 2017-07-31 DIAGNOSIS — I493 Ventricular premature depolarization: Secondary | ICD-10-CM | POA: Diagnosis not present

## 2017-07-31 DIAGNOSIS — M170 Bilateral primary osteoarthritis of knee: Secondary | ICD-10-CM | POA: Diagnosis not present

## 2017-07-31 DIAGNOSIS — M4854XA Collapsed vertebra, not elsewhere classified, thoracic region, initial encounter for fracture: Secondary | ICD-10-CM | POA: Diagnosis not present

## 2017-07-31 DIAGNOSIS — R109 Unspecified abdominal pain: Secondary | ICD-10-CM | POA: Insufficient documentation

## 2017-07-31 DIAGNOSIS — Z87891 Personal history of nicotine dependence: Secondary | ICD-10-CM | POA: Diagnosis not present

## 2017-07-31 DIAGNOSIS — M25561 Pain in right knee: Secondary | ICD-10-CM | POA: Insufficient documentation

## 2017-07-31 DIAGNOSIS — Z9889 Other specified postprocedural states: Secondary | ICD-10-CM | POA: Diagnosis not present

## 2017-07-31 DIAGNOSIS — Z791 Long term (current) use of non-steroidal anti-inflammatories (NSAID): Secondary | ICD-10-CM | POA: Diagnosis not present

## 2017-07-31 DIAGNOSIS — M25512 Pain in left shoulder: Secondary | ICD-10-CM

## 2017-07-31 DIAGNOSIS — E871 Hypo-osmolality and hyponatremia: Secondary | ICD-10-CM | POA: Insufficient documentation

## 2017-07-31 DIAGNOSIS — G8929 Other chronic pain: Secondary | ICD-10-CM

## 2017-07-31 DIAGNOSIS — I420 Dilated cardiomyopathy: Secondary | ICD-10-CM | POA: Diagnosis not present

## 2017-07-31 DIAGNOSIS — Z91013 Allergy to seafood: Secondary | ICD-10-CM | POA: Insufficient documentation

## 2017-07-31 DIAGNOSIS — I509 Heart failure, unspecified: Secondary | ICD-10-CM | POA: Insufficient documentation

## 2017-07-31 DIAGNOSIS — C3492 Malignant neoplasm of unspecified part of left bronchus or lung: Secondary | ICD-10-CM | POA: Insufficient documentation

## 2017-07-31 NOTE — Progress Notes (Signed)
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  INTERVAL HISTORY: Patient returns to clinic today for further evaluation and continuation of maintenance Tecentriq.  She continues to have intermittent nausea and vomiting that appears to be unrelated to her treatments.  She otherwise feels well.  She continues to have chronic pain, and is requesting a long-acting narcotic. She has no neurologic complaints.  She denies any fevers.  She has a fair appetite and denies weight loss.  She has chronic shortness of breath, but denies any chest pain or hemoptysis. She has no abdominal pain.  She denies any melena or hematochezia.  Patient offers no further specific complaints today.  REVIEW OF SYSTEMS:   Review of Systems  Constitutional: Positive for malaise/fatigue. Negative for fever and weight loss.  Respiratory: Positive for shortness of breath. Negative for cough, hemoptysis and wheezing.   Cardiovascular: Negative.  Negative for chest pain and leg swelling.  Gastrointestinal: Positive for nausea and vomiting. Negative for abdominal pain, blood in stool, diarrhea and melena.  Genitourinary: Negative.  Negative for dysuria and frequency.  Musculoskeletal: Positive for back pain, joint pain and neck pain.  Skin: Negative.  Negative for rash.  Neurological: Positive for weakness. Negative for sensory change and focal weakness.  Psychiatric/Behavioral: Negative.  The patient is not nervous/anxious.     As per HPI. Otherwise, a complete review of systems is negative.  PAST MEDICAL HISTORY: Past Medical History:  Diagnosis Date  . Anxiety   . Arthritis   . Asthma    COPD  . Cancer ([place])    Liver  .  CAP (community acquired pneumonia) 06/30/2014  . Cardiomyopathy ([place])    nonischemic (EF 35-40%)  . CHF (congestive heart failure) ([place])   . COPD (chronic obstructive pulmonary disease) ([place])   . Depression   . Dyspnea   . GERD (gastroesophageal reflux disease)   . Hyperlipidemia   . Hypertension   . Pneumonia   . PVC's (premature ventricular contractions)    states 10 years ago  . Sepsis ([place]) 06/30/2014  . SOB (shortness of breath) 05/08/2014  . Spinal cord injury at T7-T12 level [facility])    2016    PAST SURGICAL HISTORY: Past Surgical History:  Procedure Laterality Date  . APPENDECTOMY    . CARDIAC CATHETERIZATION    . CATARACT EXTRACTION     right eye   . CESAREAN SECTION    . COLONOSCOPY    . LAMINOTOMY    . LUMBAR LAMINECTOMY/DECOMPRESSION MICRODISCECTOMY Left 08/01/2016   Procedure: Left Lumbar Four-Five Laminectomy and Foraminotomy;  Surgeon: Earnie Larsson, MD;  Location: [place];  Service: Neurosurgery;  Laterality: Left;  . PORTA CATH INSERTION N/A 02/03/2017   Procedure: PORTA CATH INSERTION;  Surgeon: Katha Cabal, MD;  Location: [place] [facility];  Service: Cardiovascular;  Laterality: N/A;    FAMILY HISTORY: Family History  Problem Relation Age of Onset  . Heart attack Father 49       MI x 3     ADVANCED DIRECTIVES (Y/N):  N  HEALTH MAINTENANCE: Social History   Tobacco Use  . Smoking status: Former Smoker    Packs/day: 0.50    Years: 50.00    Pack years: 25.00  Types: Cigarettes    Last attempt to quit: 01/27/2015    Years since quitting: 2.5  . Smokeless tobacco: Never Used  Substance Use Topics  . Alcohol use: No  . Drug use: No     Colonoscopy:  PAP:  Bone density:  Lipid panel:  Allergies  Allergen Reactions  . Fish Allergy Anaphylaxis    Per pt after eating [facility] she had throat swelling and SOB. MSY   . Fentanyl     Overly sedated and groggy, ineffective     Current Outpatient Medications  Medication Sig Dispense  Refill  . ADVAIR DISKUS 250-50 MCG/DOSE AEPB Inhale 1 puff into the lungs 2 (two) times daily.    Marland Kitchen albuterol (PROAIR HFA) 108 (90 Base) MCG/ACT inhaler Inhale 2 puffs into the lungs every 6 (six) hours as needed for wheezing or shortness of breath.     Marland Kitchen alendronate (FOSAMAX) 70 MG tablet Take 70 mg by mouth every Thursday.     . ARIPiprazole (ABILIFY) 5 MG tablet Take 5 mg by mouth daily.    . diphenoxylate-atropine (LOMOTIL) 2.5-0.025 MG tablet Take 1 tablet by mouth 4 (four) times daily as needed for diarrhea or loose stools. 30 tablet 0  . DULoxetine (CYMBALTA) 60 MG capsule Take 60 mg by mouth daily.    Marland Kitchen ipratropium-albuterol (DUONEB) 0.5-2.5 (3) MG/3ML SOLN Take 3 mLs by nebulization every 4 (four) hours as needed. 360 mL 1  . LORazepam (ATIVAN) 0.5 MG tablet Take 0.5 mg by mouth as needed.    . metoprolol succinate (TOPROL-XL) 100 MG 24 hr tablet TAKE ONE TABLET BY MOUTH ONCE DAILY 30 tablet 0  . oxyCODONE-acetaminophen (PERCOCET/ROXICET) 5-325 MG tablet Take 1-2 tablets by mouth every 6 (six) hours as needed. For pain. 120 tablet 0  . pantoprazole (PROTONIX) 40 MG tablet Take 1 tablet by mouth daily.    . potassium chloride SA (K-DUR,KLOR-CON) 20 MEQ tablet Take 1 tablet (20 mEq total) by mouth daily. 30 tablet 2  . prochlorperazine (COMPAZINE) 10 MG tablet Take 1 tablet (10 mg total) by mouth every 6 (six) hours as needed (Nausea or vomiting). 60 tablet 2  . promethazine (PHENERGAN) 25 MG tablet Take 1 tablet (25 mg total) by mouth every 8 (eight) hours as needed for nausea or vomiting. 30 tablet 2  . sacubitril-valsartan (ENTRESTO) 49-51 MG Takes 1 1/2 tablet by mouth twice daily. (Patient taking differently: Takes 1 tablet in AM and 1/2 tablet in PM) 45 tablet 5  . benzonatate (TESSALON) 100 MG capsule Take 1 capsule (100 mg total) by mouth every 8 (eight) hours as needed for cough. (Patient not taking: Reported on 08/03/2017) 20 capsule 0  . metoCLOPramide (REGLAN) 10 MG tablet Take 1  tablet (10 mg total) by mouth 4 (four) times daily. 60 tablet 2  . morphine (MS CONTIN) 15 MG 12 hr tablet Take 1 tablet (15 mg total) by mouth every 12 (twelve) hours. 60 tablet 0   No current facility-administered medications for this visit.    Facility-Administered Medications Ordered in Other Visits  Medication Dose Route Frequency Provider Last Rate Last Dose  . heparin lock flush 100 unit/mL  500 Units Intracatheter PRN Lloyd Huger, MD      . sodium chloride flush (NS) 0.9 % injection 10 mL  10 mL Intravenous PRN Lloyd Huger, MD   10 mL at 03/01/17 0841    OBJECTIVE: Vitals:   08/03/17 1008  BP: 96/69  Pulse: 65  Resp: 16  Temp: (!) 97.5 F (36.4 C)     Body mass index is 33.61 kg/m.    ECOG FS:1 - Symptomatic but completely ambulatory  General: Well-developed, well-nourished, no acute distress. Eyes: Pink conjunctiva, anicteric sclera. HEENT: Normocephalic, moist mucous membranes, clear oropharnyx. Lungs: Clear to auscultation bilaterally. Heart: Regular rate and rhythm. No rubs, murmurs, or gallops. Abdomen: Soft, nontender, nondistended. No organomegaly noted, normoactive bowel sounds. Musculoskeletal: No edema, cyanosis, or clubbing. Neuro: Alert, answering all questions appropriately. Cranial nerves grossly intact. Skin: No rashes or petechiae noted. Psych: Normal affect.  LAB RESULTS:  Lab Results  Component Value Date   NA 137 08/03/2017   K 3.2 (L) 08/03/2017   CL 108 08/03/2017   CO2 19 (L) 08/03/2017   GLUCOSE 125 (H) 08/03/2017   BUN 31 (H) 08/03/2017   CREATININE 1.21 (H) 08/03/2017   CALCIUM 8.3 (L) 08/03/2017   PROT 7.7 08/03/2017   ALBUMIN 3.8 08/03/2017   AST 23 08/03/2017   ALT 21 08/03/2017   ALKPHOS 50 08/03/2017   BILITOT 0.3 08/03/2017   GFRNONAA 46 (L) 08/03/2017   GFRAA 53 (L) 08/03/2017    Lab Results  Component Value Date   WBC 10.8 08/03/2017   NEUTROABS 7.2 (H) 08/03/2017   HGB 11.6 (L) 08/03/2017   HCT 34.6  (L) 08/03/2017   MCV 94.3 08/03/2017   PLT 245 08/03/2017     STUDIES: No results found.  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  PLAN:  1. 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 MRI of the brain on February 04, 2017 did not reveal any metastatic disease.  Proceed with cycle 9 of maintenance Tecentriq today.  Patient will also receive Zometa today.  She receives Zometa on the odd-numbered cycles.  Return to clinic in 3 weeks for further evaluation and consideration of cycle 10.  Consider reimaging in August 2019.  2. Pain: Chronic and unchanged.  Patient previously refused a long-acting narcotic, but now has agreed to take 15 mg MS Contin every 12 hours.  Continue Percocet as needed.   3.  Anemia: Improved after recent transfusion.  Monitor.  4.  COPD: Continue outpatient palliative care.  Continue current medications as prescribed. 5.  Disposition: Hospice and end-of-life care were previously discussed, but patient does not wish to pursue this option at this time.  She confirms that she is a DNR and is having outpatient palliative care visit. 6.  Diarrhea: Resolved.  Continue Lomotil as needed. 7.  Hypokalemia: Potassium decreased, but unchanged.  Continue oral supplementation as prescribed.    8.  Nausea: Continue Compazine and Phenergan as needed.  Patient was also given a prescription for Reglan today.  Patient expressed understanding and was in agreement with this plan. She also understands that She can call clinic at any time with any questions, concerns, or complaints.   Cancer Staging Small cell lung cancer [facility]) Staging form: Lung, AJCC 8th Edition - Clinical stage from 01/28/2017: Stage IV (cT4, cN3, pM1c) - Signed by Lloyd Huger, MD on 01/28/2017   Lloyd Huger, MD   08/06/2017 11:43 PM

## 2017-07-31 NOTE — Progress Notes (Deleted)
Patient's Name: Grace [name]  MRN: [id number]  Referring Provider: [name], MD  DOB: Apr 29, 1950  PCP: [name], MD  DOS: 08/01/2017  Note by: Gaspar Cola, MD  Service setting: Ambulatory outpatient  Specialty: Interventional Pain Management  Patient type: Established  Location: [facility] (AMB) Pain Management Facility  Visit type: Interventional Procedure   Primary Reason for Visit: Interventional Pain Management Treatment. CC: No chief complaint on file.  Procedure:  Anesthesia, Analgesia, Anxiolysis:  Type: Diagnostic Intra-Articular Local anesthetic and steroid Knee Injection #1  Region: Lateral infrapatellar Knee Region Level: Knee Joint Laterality: Bilateral  Type: Local Anesthesia Indication(s): Analgesia         Local Anesthetic: Lidocaine 1-2% Route: Infiltration ([place]/IM) IV Access: Declined Sedation: Declined    Indications: 1. Osteoarthritis of knees (Bilateral)   2. Chronic knee pain (Primary Area of Pain) (Bilateral)    Pain Score: Pre-procedure:  /10 Post-procedure:  /10  Pre-op Assessment:  Grace [name] is a 67 y.o. (year old), female patient, seen today for interventional treatment. She  has a past surgical history that includes Cardiac catheterization; Cesarean section; Appendectomy; Cataract extraction; Colonoscopy; Lumbar laminectomy/decompression microdiscectomy (Left, 08/01/2016); Laminotomy; and PORTA CATH INSERTION (N/A, 02/03/2017). Grace [name] has a current medication list which includes the following prescription(s): advair diskus, albuterol, alendronate, aripiprazole, benzonatate, diphenoxylate-atropine, duloxetine, ipratropium-albuterol, lorazepam, metoprolol succinate, oxycodone-acetaminophen, pantoprazole, potassium chloride sa, promethazine, sacubitril-valsartan, and prochlorperazine, and the following Facility-Administered Medications: heparin lock flush and sodium chloride flush. Her primarily concern today is the No chief complaint on  file.  Initial Vital Signs:  Pulse/HCG Rate:    Temp:   Resp:   BP:   SpO2:    BMI: Estimated body mass index is 33.61 kg/m as calculated from the following:   Height as of 07/31/17: 5\' 5"  (1.651 m).   Weight as of 07/31/17: 202 lb (91.6 kg).  Risk Assessment: Allergies: Reviewed. She is allergic to fish allergy and fentanyl.  Allergy Precautions: None required Coagulopathies: Reviewed. None identified.  Blood-thinner therapy: None at this time Active Infection(s): Reviewed. None identified. Grace [name] is afebrile  Site Confirmation: Grace [name] was asked to confirm the procedure and laterality before marking the site Procedure checklist: Completed Consent: Before the procedure and under the influence of no sedative(s), amnesic(s), or anxiolytics, the patient was informed of the treatment options, risks and possible complications. To fulfill our ethical and legal obligations, as recommended by the American Medical Association's Code of Ethics, I have informed the patient of my clinical impression; the nature and purpose of the treatment or procedure; the risks, benefits, and possible complications of the intervention; the alternatives, including doing nothing; the risk(s) and benefit(s) of the alternative treatment(s) or procedure(s); and the risk(s) and benefit(s) of doing nothing. The patient was provided information about the general risks and possible complications associated with the procedure. These may include, but are not limited to: failure to achieve desired goals, infection, bleeding, organ or nerve damage, allergic reactions, paralysis, and death. In addition, the patient was informed of those risks and complications associated to the procedure, such as failure to decrease pain; infection; bleeding; organ or nerve damage with subsequent damage to sensory, motor, and/or autonomic systems, resulting in permanent pain, numbness, and/or weakness of one or several areas of the body;  allergic reactions; (i.e.: anaphylactic reaction); and/or death. Furthermore, the patient was informed of those risks and complications associated with the medications. These include, but are not limited to: allergic reactions (i.e.: anaphylactic or anaphylactoid reaction(s)); adrenal axis  suppression; blood sugar elevation that in diabetics may result in ketoacidosis or comma; water retention that in patients with history of congestive heart failure may result in shortness of breath, pulmonary edema, and decompensation with resultant heart failure; weight gain; swelling or edema; medication-induced neural toxicity; particulate matter embolism and blood vessel occlusion with resultant organ, and/or nervous system infarction; and/or aseptic necrosis of one or more joints. Finally, the patient was informed that Medicine is not an exact science; therefore, there is also the possibility of unforeseen or unpredictable risks and/or possible complications that may result in a catastrophic outcome. The patient indicated having understood very clearly. We have given the patient no guarantees and we have made no promises. Enough time was given to the patient to ask questions, all of which were answered to the patient's satisfaction. Grace [name] has indicated that she wanted to continue with the procedure. Attestation: I, the ordering provider, attest that I have discussed with the patient the benefits, risks, side-effects, alternatives, likelihood of achieving goals, and potential problems during recovery for the procedure that I have provided informed consent. Date  Time: {CHL [facility]-PA[facility] TIME CHOICES:[id number]}  Pre-Procedure Preparation:  Monitoring: As per clinic protocol. Respiration, ETCO2, SpO2, BP, heart rate and rhythm monitor placed and checked for adequate function Safety Precautions: Patient was assessed for positional comfort and pressure points before starting the procedure. Time-out: I initiated and  conducted the "Time-out" before starting the procedure, as per protocol. The patient was asked to participate by confirming the accuracy of the "Time Out" information. Verification of the correct person, site, and procedure were performed and confirmed by me, the nursing staff, and the patient. "Time-out" conducted as per Joint Commission's Universal Protocol (UP.01.01.01). Time:    Description of Procedure:       Position: Sitting Target Area: Knee Joint Approach: Just above the Lateral tibial plateau, lateral to the infrapatellar tendon. Area Prepped: Entire knee area, from the mid-thigh to the mid-shin. Prepping solution: ChloraPrep (2% chlorhexidine gluconate and 70% isopropyl alcohol) Safety Precautions: Aspiration looking for blood return was conducted prior to all injections. At no point did we inject any substances, as a needle was being advanced. No attempts were made at seeking any paresthesias. Safe injection practices and needle disposal techniques used. Medications properly checked for expiration dates. SDV (single dose vial) medications used. Description of the Procedure: Protocol guidelines were followed. The patient was placed in position over the fluoroscopy table. The target area was identified and the area prepped in the usual manner. Skin desensitized using vapocoolant spray. Skin & deeper tissues infiltrated with local anesthetic. Appropriate amount of time allowed to pass for local anesthetics to take effect. The procedure needles were then advanced to the target area. Proper needle placement secured. Negative aspiration confirmed. Solution injected in intermittent fashion, asking for systemic symptoms every 0.5cc of injectate. The needles were then removed and the area cleansed, making sure to leave some of the prepping solution back to take advantage of its long term bactericidal properties. There were no vitals filed for this visit.  Start Time:   hrs. End Time:    hrs. Materials:  Needle(s) Type: Regular needle Gauge: 22G Length: 3.5-in Medication(s): Please see orders for medications and dosing details.  Imaging Guidance:  Type of Imaging Technique: None used Indication(s): N/A Exposure Time: No patient exposure Contrast: None used. Fluoroscopic Guidance: N/A Ultrasound Guidance: N/A Interpretation: N/A  Antibiotic Prophylaxis:   Anti-infectives (From admission, onward)   None     Indication(s): None  identified  Post-operative Assessment:  Post-procedure Vital Signs:  Pulse/HCG Rate:    Temp:   Resp:   BP:   SpO2:    EBL: None  Complications: No immediate post-treatment complications observed by team, or reported by patient.  Note: The patient tolerated the entire procedure well. A repeat set of vitals were taken after the procedure and the patient was kept under observation following institutional policy, for this type of procedure. Post-procedural neurological assessment was performed, showing return to baseline, prior to discharge. The patient was provided with post-procedure discharge instructions, including a section on how to identify potential problems. Should any problems arise concerning this procedure, the patient was given instructions to immediately contact us, at any time, without hesitation. In any case, we plan to contact the patient by telephone for a follow-up status report regarding this interventional procedure.  Comments:  No additional relevant information.  Plan of Care   Imaging Orders  No imaging studies ordered today   Procedure Orders    No procedure(s) ordered today    Medications ordered for procedure: No orders of the defined types were placed in this encounter.  Medications administered: [name] had no medications administered during this visit.  See the medical record for exact dosing, route, and time of administration.  New Prescriptions   No medications on file   Disposition:  Discharge home  Discharge Date & Time: 08/01/2017;   hrs.   Physician-requested Follow-up: No follow-ups on file.  Future Appointments  Date Time Provider [place]  08/01/2017  1:45 PM Milinda Pointer, MD [facility] None  08/03/2017  9:30 AM [facility] None  08/03/2017  9:45 AM Grayland Ormond, Kathlene November, MD [facility] None  08/03/2017 10:00 AM [facility] INFUSION CHAIR 10 [facility] None   Primary Care Physician: [name], MD Location: [facility] Note by: Gaspar Cola, MD Date: 08/01/2017; Time: 6:26 PM  Disclaimer:  Medicine is not an Chief Strategy Officer. The only guarantee in medicine is that nothing is guaranteed. It is important to note that the decision to proceed with this intervention was based on the information collected from the patient. The Data and conclusions were drawn from the patient's questionnaire, the interview, and the physical examination. Because the information was provided in large part by the patient, it cannot be guaranteed that it has not been purposely or unconsciously manipulated. Every effort has been made to obtain as much relevant data as possible for this evaluation. It is important to note that the conclusions that lead to this procedure are derived in large part from the available data. Always take into account that the treatment will also be dependent on availability of resources and existing treatment guidelines, considered by other Pain Management Practitioners as being common knowledge and practice, at the time of the intervention. For Medico-Legal purposes, it is also important to point out that variation in procedural techniques and pharmacological choices are the acceptable norm. The indications, contraindications, technique, and results of the above procedure should only be interpreted and judged by a Board-Certified Interventional Pain Specialist with extensive familiarity and expertise in the same exact  procedure and technique.

## 2017-07-31 NOTE — Progress Notes (Signed)
Patient's Name: Grace [name]  MRN: [id number]  Referring Provider: [name], MD  DOB: 12/28/1950  PCP: Grace Pink, MD  DOS: 07/31/2017  Note by: Grace Cola, MD  Service setting: Ambulatory outpatient  Specialty: Interventional Pain Management  Location: [facility] (AMB) Pain Management Facility    Patient type: Established   Primary Reason(s) for Visit: Encounter for post-procedure evaluation of chronic illness with mild to moderate exacerbation CC: Knee Pain (Both knees, left more)  HPI  Grace [name] is a 67 y.o. year old, female patient, who comes today for a post-procedure evaluation. 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); Obesity; Tachycardia; Congestive dilated cardiomyopathy (Grace [name]); GERD (gastroesophageal reflux disease); Depression; CHF (congestive heart failure), NYHA class I (Grace [name]); Systolic dysfunction with acute on chronic heart failure (Grace [name]); Lumbar stenosis with neurogenic claudication; Breast cyst; Cardiomyopathy (Grace [name]); Diabetes mellitus type 2, uncomplicated (Grace [name]); Fibroids; Hidradenitis; Nicotine abuse; Osteoarthritis cervical spine; Osteoarthritis of hands (Bilateral); PUD (peptic ulcer disease); Pulmonary nodule; Chronic knee pain (Primary Area of Pain) (Bilateral); Chronic pain syndrome; Osteoarthritis of knees (Bilateral); Thoracic compression fracture (T4, T5, T6, and T7); Long term prescription benzodiazepine use; Long term prescription opiate use; Opiate use; NSAID long-term use; Chronic shoulder pain (Bilateral) (R>L); Chronic elbow pain (Right); Chronic acromioclavicular pain (Right); Osteoarthritis acromioclavicular joint (Right); Osteoarthritis glenohumeral joint (Left); Medial epicondylitis of elbow (Right); Liver mass; Small cell lung cancer (Grace [name]); and Hyponatremia on their problem list. Her primarily concern today is the Knee Pain (Both knees, left more)  Pain  Assessment: Location: Left, Right Knee Radiating: Denies Onset: More than a month ago Duration: Chronic pain Quality: Sharp, Aching Severity: 5 /10 (subjective, self-reported pain score)  Note: Reported level is compatible with observation.                               Timing: Intermittent Modifying factors: don't move, meds BP: 122/89  HR: (!) 123  Grace [name] comes in today for post-procedure evaluation after the treatment done on Visit date not found. The patient indicates that she was unable to keep her follow-up appointment for the postprocedure evaluation on the right-sided suprascapular nerve block due to her chemotherapy. She indicates that it took a while to kick in, but it did help with her shoulder pain. At this point, she returns on a wheelchair, with bilateral knee pain indicating that she needs the injections.  Further details on both, my assessment(s), as well as the proposed treatment plan, please see below.  Post-Procedure Assessment  Visit date not found Procedure: Diagnostic right-sided suprascapular nerve block #1 under fluoroscopic guidance, no sedation Pre-procedure pain score:  6/10 Post-procedure pain score: 6/10 No initial benefit, possible due to rapid discharge after no sedation procedure, without enough time to allow full onset of block. Influential Factors: BMI: 33.61 kg/m Intra-procedural challenges: None observed.         Assessment challenges: None detected.              Reported side-effects: None.        Post-procedural adverse reactions or complications: None reported         Sedation: No sedation used. When no sedatives are used, the analgesic levels obtained are directly associated to the effectiveness of the local anesthetics. However, when sedation is provided, the level of analgesia obtained during the initial 1 hour following the intervention, is believed to be the result of a  combination of factors. These factors may include, but are not limited  to: 1. The effectiveness of the local anesthetics used. 2. The effects of the analgesic(s) and/or anxiolytic(s) used. 3. The degree of discomfort experienced by the patient at the time of the procedure. 4. The patients ability and reliability in recalling and recording the events. 5. The presence and influence of possible secondary gains and/or psychosocial factors. Reported result: Relief experienced during the 1st hour after the procedure: 100 % (Ultra-Short Term Relief)            Interpretative annotation: Clinically appropriate result. No IV Analgesic or Anxiolytic given, therefore benefits are completely due to Local Anesthetic effects.          Effects of local anesthetic: The analgesic effects attained during this period are directly associated to the localized infiltration of local anesthetics and therefore cary significant diagnostic value as to the etiological location, or anatomical origin, of the pain. Expected duration of relief is directly dependent on the pharmacodynamics of the local anesthetic used. Long-acting (4-6 hours) anesthetics used.  Reported result: Relief during the next 4 to 6 hour after the procedure: 80 % (Short-Term Relief)            Interpretative annotation: Clinically appropriate result. Analgesia during this period is likely to be Local Anesthetic-related.          Long-term benefit: Defined as the period of time past the expected duration of local anesthetics (1 hour for short-acting and 4-6 hours for long-acting). With the possible exception of prolonged sympathetic blockade from the local anesthetics, benefits during this period are typically attributed to, or associated with, other factors such as analgesic sensory neuropraxia, antiinflammatory effects, or beneficial biochemical changes provided by agents other than the local anesthetics.  Reported result: Extended relief following procedure: 50 % (Long-Term Relief)            Interpretative annotation:  Clinically appropriate result. Partial relief. No permanent benefit expected. Inflammation plays a part in the etiology to the pain.          Current benefits: Defined as reported results that persistent at this point in time.   Analgesia: <50 %            Function: Somewhat improved ROM: Somewhat improved Interpretative annotation: Recurrence of symptoms. Therapeutic benefit observed. Results would suggest persistent aggravating factors.          Interpretation: Results would suggest a successful diagnostic intervention.                  Plan:  Set up procedure as a PRN palliative treatment option for this patient.                Laboratory Chemistry  Renal Markers Lab Results  Component Value Date   BUN 21 (H) 07/12/2017   CREATININE 1.00 07/12/2017   BCR 13 10/11/2016   GFRAA >60 07/12/2017   GFRNONAA 57 (L) 07/12/2017                             Hepatic Markers Lab Results  Component Value Date   AST 25 07/12/2017   ALT 19 07/12/2017   ALBUMIN 3.8 07/12/2017                        Hematology Parameters Lab Results  Component Value Date   INR 0.97 01/26/2017   LABPROT 12.8 01/26/2017  APTT 26 01/13/2017   PLT 260 07/12/2017   HGB 10.4 (L) 07/12/2017   HCT 30.2 (L) 07/12/2017                        CV Markers Lab Results  Component Value Date   BNP 38.0 08/22/2014   TROPONINI 0.03 ([place]) 02/20/2017                         Note: Lab results reviewed.  Recent Diagnostic Imaging Results  CT Abdomen Pelvis W Contrast CLINICAL DATA:  Extensive stage small cell left lung cancer. Restaging with ongoing chemotherapy.  EXAM: CT CHEST, ABDOMEN, AND PELVIS WITH CONTRAST  TECHNIQUE: Multidetector CT imaging of the chest, abdomen and pelvis was performed following the standard protocol during bolus administration of intravenous contrast.  CONTRAST:  173mL OMNIPAQUE IOHEXOL 300 MG/ML  SOLN  COMPARISON:  04/04/2017 CT chest, abdomen and pelvis.  FINDINGS: CT  CHEST FINDINGS  Cardiovascular: Normal heart size. No significant pericardial fluid/thickening. Right internal jugular MediPort terminates at the cavoatrial junction. Left main and 3 vessel coronary atherosclerosis. Atherosclerotic nonaneurysmal thoracic aorta. Normal caliber pulmonary arteries. No central pulmonary emboli.  Mediastinum/Nodes: No discrete thyroid nodules. Unremarkable esophagus. No axillary adenopathy. No pathologically enlarged mediastinal or hilar nodes. Previously described mildly enlarged 1.0 cm right hilar node is decreased to 0.6 cm (series 2/image 25). Previously described mildly enlarged 1.0 cm upper right paratracheal node is decreased to 0.7 cm (series 2/image 12).  Lungs/Pleura: No pneumothorax. No pleural effusion. Central left upper lobe pulmonary nodule is decreased, currently 1.7 x 1.3 cm (series 4/image 51), previously 2.3 x 2.1 cm using similar measurement technique. Thick parenchymal band in the left upper lobe is significantly decreased in thickness, compatible with evolving postinflammatory scarring. No acute consolidative airspace disease. The previously described variably cavitary irregular sub solid pulmonary nodules scattered throughout the right lung have significantly decreased in size. Representative 0.9 cm right lower lobe nodule (series 4/image 69), decreased from 1.8 cm. Separate 1.3 cm right lower lobe nodule (series 4/image 87), decreased from 2.2 cm. Right upper lobe 0.3 cm nodule (series 4/image 29), decreased from 0.9 cm. No new or enlarging pulmonary nodules.  Musculoskeletal: Healing subacute posterior right eighth and posterolateral left seventh rib fractures. Stable severe T5 and mild-to-moderate T7 vertebral compression fractures. Numerous sclerotic osseous lesions throughout the thoracic skeleton, most prominent throughout the thoracic vertebral bodies are slightly increased in sclerosis and not appreciably changed in size  or number. No appreciable new thoracic osseous lesions. Moderate thoracic spondylosis.  CT ABDOMEN PELVIS FINDINGS  Hepatobiliary: Numerous (greater than 6) hypodense liver masses have all decreased in size, with representative masses as follows:  -anterior segment 4 left liver lobe 3.7 x 2.2 cm mass (series 2/image 54), decreased from 4.0 x 3.2 cm  -segment 3 left lower lobe 1.5 x 1.3 cm mass (series 2/image 57), decreased from 2.1 x 2.0 cm  -segment 7 right liver lobe 1.3 x 1.2 cm mass (series 2/image 50), decreased from 1.8 x 1.3 cm  No appreciable new or enlarging liver masses. Normal gallbladder with no radiopaque cholelithiasis. No intrahepatic biliary ductal dilatation. Stable dilated CBD (13 mm diameter) without obstructing stone or mass by CT.  Pancreas: Normal, with no mass or duct dilation.  Spleen: Normal size. No mass.  Adrenals/Urinary Tract: Normal adrenals. No hydronephrosis. There is new patchy parenchymal enhancement throughout the mid to upper left kidney (series 6/image 14) without  discrete renal mass. Collapsed bladder. New diffuse bladder wall thickening with perivesical fat stranding.  Stomach/Bowel: Normal non-distended stomach. Normal caliber small bowel with no small bowel wall thickening. Appendectomy. Mild sigmoid diverticulosis, with no large bowel wall thickening or significant pericolonic fat stranding.  Vascular/Lymphatic: Atherosclerotic nonaneurysmal abdominal aorta. Patent portal, splenic, hepatic and renal veins. No pathologically enlarged lymph nodes in the abdomen or pelvis.  Reproductive: Grossly normal uterus.  No adnexal mass.  Other: No pneumoperitoneum, ascites or focal fluid collection.  Musculoskeletal: Small sclerotic lesions throughout the lumbar spine, sacrum and bilateral pelvic girdle are slightly increased in sclerosis without appreciable changed in lesion size or number. Marked lumbar spondylosis.  IMPRESSION: 1.  New diffuse bladder wall thickening with perivesical fat stranding suggesting acute cystitis. Recommend correlation with urinalysis. 2. New patchy parenchymal enhancement throughout the mid to upper left kidney worrisome for acute left pyelonephritis. No evidence of renal abscess. 3. Interval positive treatment response. Primary tumor in the central left upper lung lobe is decreased. Thoracic adenopathy is decreased. Cavitary pulmonary nodules in the right lung are all decreased. Liver metastases are decreased. 4. Faintly sclerotic osseous metastases throughout the visualized skeleton, slightly increased in sclerosis, which may be due to treatment response. No convincing new or progressive metastatic disease. 5. Healing subacute bilateral rib fractures.  These results will be called to the ordering clinician or representative by the Radiologist Assistant, and communication documented in the PACS or zVision Dashboard.  Electronically Signed   By: Ilona Sorrel M.D.   On: 06/14/2017 13:05  Complexity Note: I personally reviewed the fluoroscopic imaging of the procedure.                        Meds   Current Outpatient Medications:  .  ADVAIR DISKUS 250-50 MCG/DOSE AEPB, Inhale 1 puff into the lungs 2 (two) times daily., Disp: , Rfl:  .  albuterol (PROAIR HFA) 108 (90 Base) MCG/ACT inhaler, Inhale 2 puffs into the lungs every 6 (six) hours as needed for wheezing or shortness of breath. , Disp: , Rfl:  .  alendronate (FOSAMAX) 70 MG tablet, Take 70 mg by mouth every Thursday. , Disp: , Rfl:  .  ARIPiprazole (ABILIFY) 5 MG tablet, Take 5 mg by mouth daily., Disp: , Rfl:  .  benzonatate (TESSALON) 100 MG capsule, Take 1 capsule (100 mg total) by mouth every 8 (eight) hours as needed for cough., Disp: 20 capsule, Rfl: 0 .  diphenoxylate-atropine (LOMOTIL) 2.5-0.025 MG tablet, Take 1 tablet by mouth 4 (four) times daily as needed for diarrhea or loose stools., Disp: 30 tablet, Rfl: 0 .   DULoxetine (CYMBALTA) 60 MG capsule, Take 60 mg by mouth daily., Disp: , Rfl:  .  ipratropium-albuterol (DUONEB) 0.5-2.5 (3) MG/3ML SOLN, Take 3 mLs by nebulization every 4 (four) hours as needed., Disp: 360 mL, Rfl: 1 .  LORazepam (ATIVAN) 0.5 MG tablet, Take 0.5 mg by mouth as needed., Disp: , Rfl:  .  metoprolol succinate (TOPROL-XL) 100 MG 24 hr tablet, TAKE ONE TABLET BY MOUTH ONCE DAILY, Disp: 30 tablet, Rfl: 0 .  oxyCODONE-acetaminophen (PERCOCET/ROXICET) 5-325 MG tablet, Take 1-2 tablets by mouth every 6 (six) hours as needed. For pain., Disp: 120 tablet, Rfl: 0 .  pantoprazole (PROTONIX) 40 MG tablet, Take 1 tablet by mouth daily., Disp: , Rfl:  .  potassium chloride SA (K-DUR,KLOR-CON) 20 MEQ tablet, Take 1 tablet (20 mEq total) by mouth daily., Disp: 30 tablet, Rfl: 2 .  promethazine (PHENERGAN) 25 MG tablet, Take 25 mg by mouth every 8 (eight) hours as needed for nausea or vomiting., Disp: , Rfl:  .  sacubitril-valsartan (ENTRESTO) 49-51 MG, Takes 1 1/2 tablet by mouth twice daily. (Patient taking differently: Takes 1 tablet in AM and 1/2 tablet in PM), Disp: 45 tablet, Rfl: 5 No current facility-administered medications for this visit.   Facility-Administered Medications Ordered in Other Visits:  .  heparin lock flush 100 unit/mL, 500 Units, Intracatheter, PRN, Gray[place] Ormond, Kathlene November, MD .  sodium chloride flush (NS) 0.9 % injection 10 mL, 10 mL, Intravenous, PRN, Grayland Ormond, Kathlene November, MD, 10 mL at 03/01/17 0841  ROS  Constitutional: Denies any fever or chills Gastrointestinal: No reported hemesis, hematochezia, vomiting, or acute GI distress Musculoskeletal: Denies any acute onset joint swelling, redness, loss of ROM, or weakness Neurological: No reported episodes of acute onset apraxia, aphasia, dysarthria, agnosia, amnesia, paralysis, loss of coordination, or loss of consciousness  Allergies  Ms. Vega is allergic to fish allergy and fentanyl.  [place] [name]  Drug: Ms. Shumard  reports  that she does not use drugs. Alcohol:  reports that she does not drink alcohol. Tobacco:  reports that she quit smoking about 2 years ago. Her smoking use included cigarettes. She has a 25.00 pack-year smoking history. She has never used smokeless tobacco. Medical:  has a past medical history of Anxiety, Arthritis, Asthma, Cancer ([place]), CAP (community acquired pneumonia) (06/30/2014), Cardiomyopathy ([place]), CHF (congestive heart failure) ([place]), COPD (chronic obstructive pulmonary disease) ([place]), Depression, Dyspnea, GERD (gastroesophageal reflux disease), Hyperlipidemia, Hypertension, Pneumonia, PVC's (premature ventricular contractions), Sepsis ([place]) (06/30/2014), SOB (shortness of breath) (05/08/2014), and Spinal cord injury at T7-T12 level [facility]). Surgical: Ms. Godar  has a past surgical history that includes Cardiac catheterization; Cesarean section; Appendectomy; Cataract extraction; Colonoscopy; Lumbar laminectomy/decompression microdiscectomy (Left, 08/01/2016); Laminotomy; and PORTA CATH INSERTION (N/A, 02/03/2017). Family: family history includes Heart attack (age of onset: 49) in her father.  Constitutional Exam  General appearance: Well nourished, well developed, and well hydrated. In no apparent acute distress Vitals:   07/31/17 0811  BP: 122/89  Pulse: (!) 123  Temp: 97.8 F (36.6 C)  SpO2: 98%  Weight: 202 lb (91.6 kg)  Height: 5\' 5"  (1.651 m)   BMI Assessment: Estimated body mass index is 33.61 kg/m as calculated from the following:   Height as of this encounter: 5\' 5"  (1.651 m).   Weight as of this encounter: 202 lb (91.6 kg).  BMI interpretation table: BMI level Category Range association with higher incidence of chronic pain  <18 kg/m2 Underweight   18.5-24.9 kg/m2 Ideal body weight   25-29.9 kg/m2 Overweight Increased incidence by 20%  30-34.9 kg/m2 Obese (Class I) Increased incidence by 68%  35-39.9 kg/m2 Severe obesity (Class II) Increased incidence by 136%  >40 kg/m2  Extreme obesity (Class III) Increased incidence by 254%   Patient's current BMI Ideal Body weight  Body mass index is 33.61 kg/m. Ideal body weight: 57 kg (125 lb 10.6 oz) Adjusted ideal body weight: 70.9 kg (156 lb 3.2 oz)   BMI Readings from Last 4 Encounters:  07/31/17 33.61 kg/m  07/12/17 33.61 kg/m  05/31/17 34.95 kg/m  04/21/17 35.11 kg/m   Wt Readings from Last 4 Encounters:  07/31/17 202 lb (91.6 kg)  07/12/17 202 lb (91.6 kg)  05/31/17 210 lb (95.3 kg)  04/21/17 211 lb (95.7 kg)  Psych/Mental status: Alert, oriented x 3 (person, place, & time)       Eyes: PERLA Respiratory: No  evidence of acute respiratory distress  Cervical Spine Area Exam  Skin & Axial Inspection: No masses, redness, edema, swelling, or associated skin lesions Alignment: Symmetrical Functional ROM: Unrestricted ROM      Stability: No instability detected Muscle Tone/Strength: Functionally intact. No obvious neuro-muscular anomalies detected. Sensory (Neurological): Unimpaired Palpation: No palpable anomalies              Upper Extremity (UE) Exam    Side: Right upper extremity  Side: Left upper extremity  Skin & Extremity Inspection: Skin color, temperature, and hair growth are WNL. No peripheral edema or cyanosis. No masses, redness, swelling, asymmetry, or associated skin lesions. No contractures.  Skin & Extremity Inspection: Skin color, temperature, and hair growth are WNL. No peripheral edema or cyanosis. No masses, redness, swelling, asymmetry, or associated skin lesions. No contractures.  Functional ROM: Decreased ROM for shoulder  Functional ROM: Decreased ROM for shoulder  Muscle Tone/Strength: Functionally intact. No obvious neuro-muscular anomalies detected.  Muscle Tone/Strength: Functionally intact. No obvious neuro-muscular anomalies detected.  Sensory (Neurological): Arthropathic arthralgia affecting the shoulder  Sensory (Neurological): Arthropathic arthralgia affecting the shoulder   Palpation: No palpable anomalies              Palpation: No palpable anomalies              Provocative Test(s):  Phalen's test: deferred Tinel's test: deferred Apley's scratch test (touch opposite shoulder):  Action 1 (Across chest): Decreased ROM Action 2 (Overhead): Decreased ROM Action 3 (LB reach): Decreased ROM   Provocative Test(s):  Phalen's test: deferred Tinel's test: deferred Apley's scratch test (touch opposite shoulder):  Action 1 (Across chest): Decreased ROM Action 2 (Overhead): Decreased ROM Action 3 (LB reach): Decreased ROM    Thoracic Spine Area Exam  Skin & Axial Inspection: No masses, redness, or swelling Alignment: Symmetrical Functional ROM: Unrestricted ROM Stability: No instability detected Muscle Tone/Strength: Functionally intact. No obvious neuro-muscular anomalies detected. Sensory (Neurological): Unimpaired Muscle strength & Tone: No palpable anomalies  Lumbar Spine Area Exam  Skin & Axial Inspection: No masses, redness, or swelling Alignment: Symmetrical Functional ROM: Unrestricted ROM       Stability: No instability detected Muscle Tone/Strength: Functionally intact. No obvious neuro-muscular anomalies detected. Sensory (Neurological): Unimpaired Palpation: No palpable anomalies       Provocative Tests: Lumbar Hyperextension/rotation test: deferred today       Lumbar quadrant test (Kemp's test): deferred today       Lumbar Lateral bending test: deferred today       Patrick's Maneuver: deferred today                   FABER test: deferred today       Thigh-thrust test: deferred today       S-I compression test: deferred today       S-I distraction test: deferred today        Gait & Posture Assessment  Ambulation: Patient came in today in a wheel chair Gait: Significantly limited. Dependent on assistive device to ambulate Posture: WNL   Lower Extremity Exam    Side: Right lower extremity  Side: Left lower extremity  Stability: No  instability observed          Stability: No instability observed          Skin & Extremity Inspection: Skin color, temperature, and hair growth are WNL. No peripheral edema or cyanosis. No masses, redness, swelling, asymmetry, or associated skin lesions. No contractures.  Skin & Extremity  Inspection: Skin color, temperature, and hair growth are WNL. No peripheral edema or cyanosis. No masses, redness, swelling, asymmetry, or associated skin lesions. No contractures.  Functional ROM: Decreased ROM for knee joint          Functional ROM: Decreased ROM for knee joint          Muscle Tone/Strength: Functionally intact. No obvious neuro-muscular anomalies detected.  Muscle Tone/Strength: Functionally intact. No obvious neuro-muscular anomalies detected.  Sensory (Neurological): Articular pain pattern  Sensory (Neurological): Articular pain pattern  Palpation: No palpable anomalies  Palpation: No palpable anomalies   Assessment  Primary Diagnosis & Pertinent Problem List: The primary encounter diagnosis was Osteoarthritis of knees (Bilateral). Diagnoses of Chronic knee pain (Primary Area of Pain) (Bilateral) and Chronic shoulder pain (Bilateral) (R>L) were also pertinent to this visit.  Status Diagnosis  Worsening Worsening Worsening 1. Osteoarthritis of knees (Bilateral)   2. Chronic knee pain (Primary Area of Pain) (Bilateral)   3. Chronic shoulder pain (Bilateral) (R>L)     Problems updated and reviewed during this visit: No problems updated. Plan of Care  Pharmacotherapy (Medications Ordered): No orders of the defined types were placed in this encounter.  Medications administered today: [name] had no medications administered during this visit.   Procedure Orders     KNEE INJECTION Lab Orders  No laboratory test(s) ordered today   Imaging Orders  No imaging studies ordered today   Referral Orders  No referral(s) requested today    Interventional management  options: Planned, scheduled, and/or pending:   Diagnostic/Therapeuticbilateral intra-articular knee injection #1 with local anesthetic and steroid, no fluoroscopy or IV sedation. The patient indicates that she would like to have her shoulders treated after her knees.    Considering:   Diagnostic bilateral intra-articular knee joint injection with local anesthetic and steroid  Possible series of 5 bilateral intra-articular knee joint injections with Hyalgan  Diagnostic bilateral Genicular nerve block  Possible bilateral Genicular nerve RFA  Diagnostic bilateral intra-articular shoulder joint injection  Diagnostic bilateral suprascapular nerve block  Possible bilateral suprascapular nerve RFA    Palliative PRN treatment(s):   None at this time    Provider-requested follow-up: Return for Procedure (no sedation): (B) IA Knee (L + S).  Future Appointments  Date Time Provider [place]  08/01/2017  1:45 PM Milinda Pointer, MD [facility] None  08/03/2017  9:30 AM [facility] None  08/03/2017  9:45 AM Grayland Ormond, Kathlene November, MD [facility] None  08/03/2017 10:00 AM [facility]FUSION CHAIR 10 [facility] None   Primary Care Physician: Grace Pink, MD Location: [facility] Note by: Grace Cola, MD Date: 07/31/2017; Time: 9:21 AM

## 2017-07-31 NOTE — Patient Instructions (Addendum)
____________________________________________________________________________________________  Preparing for your procedure (without sedation)  Instructions: . Oral Intake: Do not eat or drink anything for at least 3 hours prior to your procedure. . Transportation: Unless otherwise stated by your physician, you may drive yourself after the procedure. . Blood Pressure Medicine: Take your blood pressure medicine with a sip of water the morning of the procedure. . Blood thinners:  . Diabetics on insulin: Notify the staff so that you can be scheduled 1st case in the morning. If your diabetes requires high dose insulin, take only  of your normal insulin dose the morning of the procedure and notify the staff that you have done so. . Preventing infections: Shower with an antibacterial soap the morning of your procedure.  . Build-up your immune system: Take 1000 mg of Vitamin C with every meal (3 times a day) the day prior to your procedure. Marland Kitchen Antibiotics: Inform the staff if you have a condition or reason that requires you to take antibiotics before dental procedures. . Pregnancy: If you are pregnant, call and cancel the procedure. . Sickness: If you have a cold, fever, or any active infections, call and cancel the procedure. . Arrival: You must be in the facility at least 30 minutes prior to your scheduled procedure. . Children: Do not bring any children with you. . Dress appropriately: Bring dark clothing that you would not mind if they get stained. . Valuables: Do not bring any jewelry or valuables.  Procedure appointments are reserved for interventional treatments only. Marland Kitchen No Prescription Refills. . No medication changes will be discussed during procedure appointments. . No disability issues will be discussed.  Remember:  Regular Business hours are:  Monday to Thursday 8:00 AM to 4:00 PM  Provider's Schedule: Milinda Pointer, MD:  Procedure days: Tuesday and Thursday 7:30 AM to 4:00  PM  Gillis Santa, MD:  Procedure days: Monday and Wednesday 7:30 AM to 4:00 PM ____________________________________________________________________________________________   ____________________________________________________________________________________________  Pain Scale  Introduction: The pain score used by this practice is the Verbal Numerical Rating Scale (VNRS-11). This is an 11-point scale. It is for adults and children 10 years or older. There are significant differences in how the pain score is reported, used, and applied. Forget everything you learned in the past and learn this scoring system.  General Information: The scale should reflect your current level of pain. Unless you are specifically asked for the level of your worst pain, or your average pain. If you are asked for one of these two, then it should be understood that it is over the past 24 hours.  Basic Activities of Daily Living (ADL): Personal hygiene, dressing, eating, transferring, and using restroom.  Instructions: Most patients tend to report their level of pain as a combination of two factors, their physical pain and their psychosocial pain. This last one is also known as "suffering" and it is reflection of how physical pain affects you socially and psychologically. From now on, report them separately. From this point on, when asked to report your pain level, report only your physical pain. Use the following table for reference.  Pain Clinic Pain Levels (0-5/10)  Pain Level Score  Description  No Pain 0   Mild pain 1 Nagging, annoying, but does not interfere with basic activities of daily living (ADL). Patients are able to eat, bathe, get dressed, toileting (being able to get on and off the toilet and perform personal hygiene functions), transfer (move in and out of bed or a chair without assistance),  and maintain continence (able to control bladder and bowel functions). Blood pressure and heart rate are  unaffected. A normal heart rate for a healthy adult ranges from 60 to 100 bpm (beats per minute).   Mild to moderate pain 2 Noticeable and distracting. Impossible to hide from other people. More frequent flare-ups. Still possible to adapt and function close to normal. It can be very annoying and may have occasional stronger flare-ups. With discipline, patients may get used to it and adapt.   Moderate pain 3 Interferes significantly with activities of daily living (ADL). It becomes difficult to feed, bathe, get dressed, get on and off the toilet or to perform personal hygiene functions. Difficult to get in and out of bed or a chair without assistance. Very distracting. With effort, it can be ignored when deeply involved in activities.   Moderately severe pain 4 Impossible to ignore for more than a few minutes. With effort, patients may still be able to manage work or participate in some social activities. Very difficult to concentrate. Signs of autonomic nervous system discharge are evident: dilated pupils (mydriasis); mild sweating (diaphoresis); sleep interference. Heart rate becomes elevated (>115 bpm). Diastolic blood pressure (lower number) rises above 100 mmHg. Patients find relief in laying down and not moving.   Severe pain 5 Intense and extremely unpleasant. Associated with frowning face and frequent crying. Pain overwhelms the senses.  Ability to do any activity or maintain social relationships becomes significantly limited. Conversation becomes difficult. Pacing back and forth is common, as getting into a comfortable position is nearly impossible. Pain wakes you up from deep sleep. Physical signs will be obvious: pupillary dilation; increased sweating; goosebumps; brisk reflexes; cold, clammy hands and feet; nausea, vomiting or dry heaves; loss of appetite; significant sleep disturbance with inability to fall asleep or to remain asleep. When persistent, significant weight loss is observed due to  the complete loss of appetite and sleep deprivation.  Blood pressure and heart rate becomes significantly elevated. Caution: If elevated blood pressure triggers a pounding headache associated with blurred vision, then the patient should immediately seek attention at an urgent or emergency care unit, as these may be signs of an impending stroke.    Emergency Department Pain Levels (6-10/10)  Emergency Room Pain 6 Severely limiting. Requires emergency care and should not be seen or managed at an outpatient pain management facility. Communication becomes difficult and requires great effort. Assistance to reach the emergency department may be required. Facial flushing and profuse sweating along with potentially dangerous increases in heart rate and blood pressure will be evident.   Distressing pain 7 Self-care is very difficult. Assistance is required to transport, or use restroom. Assistance to reach the emergency department will be required. Tasks requiring coordination, such as bathing and getting dressed become very difficult.   Disabling pain 8 Self-care is no longer possible. At this level, pain is disabling. The individual is unable to do even the most "basic" activities such as walking, eating, bathing, dressing, transferring to a bed, or toileting. Fine motor skills are lost. It is difficult to think clearly.   Incapacitating pain 9 Pain becomes incapacitating. Thought processing is no longer possible. Difficult to remember your own name. Control of movement and coordination are lost.   The worst pain imaginable 10 At this level, most patients pass out from pain. When this level is reached, collapse of the autonomic nervous system occurs, leading to a sudden drop in blood pressure and heart rate. This in turn results  in a temporary and dramatic drop in blood flow to the brain, leading to a loss of consciousness. Fainting is one of the body's self defense mechanisms. Passing out puts the brain in a  calmed state and causes it to shut down for a while, in order to begin the healing process.    Summary: 1. Refer to this scale when providing [place] with your pain level. 2. Be accurate and careful when reporting your pain level. This will help with your care. 3. Over-reporting your pain level will lead to loss of credibility. 4. Even a level of 1/10 means that there is pain and will be treated at our facility. 5. High, inaccurate reporting will be documented as "Symptom Exaggeration", leading to loss of credibility and suspicions of possible secondary gains such as obtaining more narcotics, or wanting to appear disabled, for fraudulent reasons. 6. Only pain levels of 5 or below will be seen at our facility. 7. Pain levels of 6 and above will be sent to the Emergency Department and the appointment cancelled. ____________________________________________________________________________________________

## 2017-08-01 ENCOUNTER — Ambulatory Visit: Payer: Medicare Other | Admitting: Pain Medicine

## 2017-08-03 ENCOUNTER — Inpatient Hospital Stay: Payer: Medicare Other

## 2017-08-03 ENCOUNTER — Encounter: Payer: Self-pay | Admitting: Oncology

## 2017-08-03 ENCOUNTER — Other Ambulatory Visit: Payer: Self-pay | Admitting: *Deleted

## 2017-08-03 ENCOUNTER — Other Ambulatory Visit: Payer: Self-pay | Admitting: Oncology

## 2017-08-03 ENCOUNTER — Inpatient Hospital Stay (HOSPITAL_BASED_OUTPATIENT_CLINIC_OR_DEPARTMENT_OTHER): Payer: Medicare Other | Admitting: Oncology

## 2017-08-03 ENCOUNTER — Inpatient Hospital Stay: Payer: Medicare Other | Attending: Oncology

## 2017-08-03 VITALS — BP 96/69 | HR 65 | Temp 97.5°F | Resp 16 | Wt 202.0 lb

## 2017-08-03 DIAGNOSIS — C787 Secondary malignant neoplasm of liver and intrahepatic bile duct: Secondary | ICD-10-CM | POA: Diagnosis not present

## 2017-08-03 DIAGNOSIS — D649 Anemia, unspecified: Secondary | ICD-10-CM | POA: Insufficient documentation

## 2017-08-03 DIAGNOSIS — Z87891 Personal history of nicotine dependence: Secondary | ICD-10-CM

## 2017-08-03 DIAGNOSIS — I10 Essential (primary) hypertension: Secondary | ICD-10-CM

## 2017-08-03 DIAGNOSIS — F418 Other specified anxiety disorders: Secondary | ICD-10-CM | POA: Insufficient documentation

## 2017-08-03 DIAGNOSIS — J449 Chronic obstructive pulmonary disease, unspecified: Secondary | ICD-10-CM | POA: Insufficient documentation

## 2017-08-03 DIAGNOSIS — Z5112 Encounter for antineoplastic immunotherapy: Secondary | ICD-10-CM | POA: Diagnosis not present

## 2017-08-03 DIAGNOSIS — E876 Hypokalemia: Secondary | ICD-10-CM | POA: Insufficient documentation

## 2017-08-03 DIAGNOSIS — C7951 Secondary malignant neoplasm of bone: Secondary | ICD-10-CM | POA: Insufficient documentation

## 2017-08-03 DIAGNOSIS — R110 Nausea: Secondary | ICD-10-CM | POA: Diagnosis not present

## 2017-08-03 DIAGNOSIS — D696 Thrombocytopenia, unspecified: Secondary | ICD-10-CM

## 2017-08-03 DIAGNOSIS — C3412 Malignant neoplasm of upper lobe, left bronchus or lung: Secondary | ICD-10-CM | POA: Diagnosis not present

## 2017-08-03 DIAGNOSIS — Z79899 Other long term (current) drug therapy: Secondary | ICD-10-CM | POA: Insufficient documentation

## 2017-08-03 DIAGNOSIS — C778 Secondary and unspecified malignant neoplasm of lymph nodes of multiple regions: Secondary | ICD-10-CM

## 2017-08-03 DIAGNOSIS — E860 Dehydration: Secondary | ICD-10-CM | POA: Diagnosis not present

## 2017-08-03 DIAGNOSIS — C3490 Malignant neoplasm of unspecified part of unspecified bronchus or lung: Secondary | ICD-10-CM | POA: Diagnosis not present

## 2017-08-03 DIAGNOSIS — R112 Nausea with vomiting, unspecified: Secondary | ICD-10-CM | POA: Diagnosis not present

## 2017-08-03 DIAGNOSIS — N390 Urinary tract infection, site not specified: Secondary | ICD-10-CM | POA: Diagnosis not present

## 2017-08-03 DIAGNOSIS — E785 Hyperlipidemia, unspecified: Secondary | ICD-10-CM | POA: Diagnosis not present

## 2017-08-03 DIAGNOSIS — I429 Cardiomyopathy, unspecified: Secondary | ICD-10-CM | POA: Diagnosis not present

## 2017-08-03 DIAGNOSIS — I509 Heart failure, unspecified: Secondary | ICD-10-CM | POA: Insufficient documentation

## 2017-08-03 DIAGNOSIS — G8929 Other chronic pain: Secondary | ICD-10-CM | POA: Diagnosis not present

## 2017-08-03 LAB — COMPREHENSIVE METABOLIC PANEL
ALBUMIN: 3.8 g/dL (ref 3.5–5.0)
ALT: 21 U/L (ref 14–54)
ANION GAP: 10 (ref 5–15)
AST: 23 U/L (ref 15–41)
Alkaline Phosphatase: 50 U/L (ref 38–126)
BILIRUBIN TOTAL: 0.3 mg/dL (ref 0.3–1.2)
BUN: 31 mg/dL — ABNORMAL HIGH (ref 6–20)
CALCIUM: 8.3 mg/dL — AB (ref 8.9–10.3)
CO2: 19 mmol/L — ABNORMAL LOW (ref 22–32)
Chloride: 108 mmol/L (ref 101–111)
Creatinine, Ser: 1.21 mg/dL — ABNORMAL HIGH (ref 0.44–1.00)
GFR calc non Af Amer: 46 mL/min — ABNORMAL LOW (ref >60)
GFR, EST AFRICAN AMERICAN: 53 mL/min — AB (ref >60)
GLUCOSE: 125 mg/dL — AB (ref 65–99)
POTASSIUM: 3.2 mmol/L — AB (ref 3.5–5.1)
Sodium: 137 mmol/L (ref 135–145)
TOTAL PROTEIN: 7.7 g/dL (ref 6.5–8.1)

## 2017-08-03 LAB — CBC WITH DIFFERENTIAL/PLATELET
BASOS ABS: 0.1 10*3/uL (ref 0–0.1)
BASOS PCT: 1 %
Eosinophils Absolute: 0.5 10*3/uL (ref 0–0.7)
Eosinophils Relative: 5 %
HEMATOCRIT: 34.6 % — AB (ref 35.0–47.0)
Hemoglobin: 11.6 g/dL — ABNORMAL LOW (ref 12.0–16.0)
LYMPHS PCT: 21 %
Lymphs Abs: 2.3 10*3/uL (ref 1.0–3.6)
MCH: 31.5 pg (ref 26.0–34.0)
MCHC: 33.4 g/dL (ref 32.0–36.0)
MCV: 94.3 fL (ref 80.0–100.0)
Monocytes Absolute: 0.8 10*3/uL (ref 0.2–0.9)
Monocytes Relative: 7 %
NEUTROS PCT: 66 %
Neutro Abs: 7.2 10*3/uL — ABNORMAL HIGH (ref 1.4–6.5)
Platelets: 245 10*3/uL (ref 150–440)
RBC: 3.67 MIL/uL — AB (ref 3.80–5.20)
RDW: 15.5 % — ABNORMAL HIGH (ref 11.5–14.5)
WBC: 10.8 10*3/uL (ref 3.6–11.0)

## 2017-08-03 MED ORDER — HEPARIN SOD (PORK) LOCK FLUSH 100 UNIT/ML IV SOLN
500.0000 [IU] | Freq: Once | INTRAVENOUS | Status: DC | PRN
Start: 2017-08-03 — End: 2017-08-03

## 2017-08-03 MED ORDER — SODIUM CHLORIDE 0.9 % IV SOLN
1200.0000 mg | Freq: Once | INTRAVENOUS | Status: AC
  Administered 2017-08-03: 1200 mg via INTRAVENOUS
  Filled 2017-08-03: qty 20

## 2017-08-03 MED ORDER — PROMETHAZINE HCL 25 MG PO TABS: 25.0000 mg | ORAL_TABLET | Freq: Three times a day (TID) | ORAL | 2 refills | Status: AC | PRN

## 2017-08-03 MED ORDER — PROCHLORPERAZINE MALEATE 10 MG PO TABS: 10.0000 mg | ORAL_TABLET | Freq: Four times a day (QID) | ORAL | 2 refills | Status: DC | PRN

## 2017-08-03 MED ORDER — SODIUM CHLORIDE 0.9 % IV SOLN
Freq: Once | INTRAVENOUS | Status: AC
  Administered 2017-08-03: 11:00:00 via INTRAVENOUS
  Filled 2017-08-03: qty 1000

## 2017-08-03 MED ORDER — MORPHINE SULFATE ER 15 MG PO TBCR: 15.0000 mg | EXTENDED_RELEASE_TABLET | Freq: Two times a day (BID) | ORAL | 0 refills | Status: DC

## 2017-08-03 MED ORDER — HEPARIN SOD (PORK) LOCK FLUSH 100 UNIT/ML IV SOLN
500.0000 [IU] | Freq: Once | INTRAVENOUS | Status: AC
  Administered 2017-08-03: 500 [IU] via INTRAVENOUS
  Filled 2017-08-03: qty 5

## 2017-08-03 MED ORDER — SODIUM CHLORIDE 0.9% FLUSH
10.0000 mL | Freq: Once | INTRAVENOUS | Status: AC
  Administered 2017-08-03: 10 mL via INTRAVENOUS
  Filled 2017-08-03: qty 10

## 2017-08-03 MED ORDER — METOCLOPRAMIDE HCL 10 MG PO TABS: 10.0000 mg | ORAL_TABLET | Freq: Four times a day (QID) | ORAL | 2 refills | Status: DC

## 2017-08-03 NOTE — Progress Notes (Signed)
Potassium 3.2, Per Tillie Rung RN, Dr. Grayland Ormond aware, NNO at this time.

## 2017-08-04 LAB — THYROID PANEL WITH TSH
Free Thyroxine Index: 2.3 (ref 1.2–4.9)
T3 Uptake Ratio: 29 % (ref 24–39)
T4, Total: 7.9 ug/dL (ref 4.5–12.0)
TSH: 3.18 u[IU]/mL (ref 0.450–4.500)

## 2017-08-14 ENCOUNTER — Telehealth: Payer: Self-pay | Admitting: *Deleted

## 2017-08-14 ENCOUNTER — Inpatient Hospital Stay: Payer: Medicare Other | Attending: Oncology | Admitting: Oncology

## 2017-08-14 ENCOUNTER — Emergency Department: Payer: Medicare Other

## 2017-08-14 ENCOUNTER — Other Ambulatory Visit: Payer: Self-pay

## 2017-08-14 ENCOUNTER — Inpatient Hospital Stay: Payer: Medicare Other

## 2017-08-14 ENCOUNTER — Inpatient Hospital Stay
Admission: EM | Admit: 2017-08-14 | Discharge: 2017-08-16 | DRG: 682 | Disposition: A | Discharge status: Home / Self Care | Payer: Medicare Other | Attending: Internal Medicine | Admitting: Internal Medicine

## 2017-08-14 ENCOUNTER — Encounter: Payer: Self-pay | Admitting: Emergency Medicine

## 2017-08-14 VITALS — BP 72/58 | HR 88 | Temp 96.7°F | Resp 24

## 2017-08-14 DIAGNOSIS — C787 Secondary malignant neoplasm of liver and intrahepatic bile duct: Secondary | ICD-10-CM | POA: Diagnosis present

## 2017-08-14 DIAGNOSIS — Z87891 Personal history of nicotine dependence: Secondary | ICD-10-CM

## 2017-08-14 DIAGNOSIS — I10 Essential (primary) hypertension: Secondary | ICD-10-CM | POA: Diagnosis present

## 2017-08-14 DIAGNOSIS — R112 Nausea with vomiting, unspecified: Secondary | ICD-10-CM | POA: Diagnosis not present

## 2017-08-14 DIAGNOSIS — G9341 Metabolic encephalopathy: Secondary | ICD-10-CM | POA: Diagnosis present

## 2017-08-14 DIAGNOSIS — C3490 Malignant neoplasm of unspecified part of unspecified bronchus or lung: Secondary | ICD-10-CM | POA: Diagnosis present

## 2017-08-14 DIAGNOSIS — Z885 Allergy status to narcotic agent status: Secondary | ICD-10-CM | POA: Diagnosis not present

## 2017-08-14 DIAGNOSIS — R531 Weakness: Secondary | ICD-10-CM | POA: Insufficient documentation

## 2017-08-14 DIAGNOSIS — E785 Hyperlipidemia, unspecified: Secondary | ICD-10-CM | POA: Diagnosis present

## 2017-08-14 DIAGNOSIS — N179 Acute kidney failure, unspecified: Principal | ICD-10-CM | POA: Diagnosis present

## 2017-08-14 DIAGNOSIS — E860 Dehydration: Secondary | ICD-10-CM

## 2017-08-14 DIAGNOSIS — R109 Unspecified abdominal pain: Secondary | ICD-10-CM | POA: Diagnosis not present

## 2017-08-14 DIAGNOSIS — C7951 Secondary malignant neoplasm of bone: Secondary | ICD-10-CM | POA: Insufficient documentation

## 2017-08-14 DIAGNOSIS — Z7951 Long term (current) use of inhaled steroids: Secondary | ICD-10-CM

## 2017-08-14 DIAGNOSIS — I429 Cardiomyopathy, unspecified: Secondary | ICD-10-CM | POA: Diagnosis present

## 2017-08-14 DIAGNOSIS — I959 Hypotension, unspecified: Secondary | ICD-10-CM | POA: Diagnosis not present

## 2017-08-14 DIAGNOSIS — R4182 Altered mental status, unspecified: Secondary | ICD-10-CM | POA: Diagnosis not present

## 2017-08-14 DIAGNOSIS — N19 Unspecified kidney failure: Secondary | ICD-10-CM | POA: Diagnosis not present

## 2017-08-14 DIAGNOSIS — J449 Chronic obstructive pulmonary disease, unspecified: Secondary | ICD-10-CM | POA: Diagnosis present

## 2017-08-14 DIAGNOSIS — C3402 Malignant neoplasm of left main bronchus: Secondary | ICD-10-CM | POA: Insufficient documentation

## 2017-08-14 DIAGNOSIS — M129 Arthropathy, unspecified: Secondary | ICD-10-CM | POA: Diagnosis not present

## 2017-08-14 DIAGNOSIS — I9589 Other hypotension: Secondary | ICD-10-CM

## 2017-08-14 DIAGNOSIS — E861 Hypovolemia: Secondary | ICD-10-CM

## 2017-08-14 DIAGNOSIS — F05 Delirium due to known physiological condition: Secondary | ICD-10-CM

## 2017-08-14 DIAGNOSIS — Z5112 Encounter for antineoplastic immunotherapy: Secondary | ICD-10-CM | POA: Insufficient documentation

## 2017-08-14 DIAGNOSIS — Z79899 Other long term (current) drug therapy: Secondary | ICD-10-CM | POA: Diagnosis not present

## 2017-08-14 DIAGNOSIS — I2510 Atherosclerotic heart disease of native coronary artery without angina pectoris: Secondary | ICD-10-CM | POA: Insufficient documentation

## 2017-08-14 DIAGNOSIS — K219 Gastro-esophageal reflux disease without esophagitis: Secondary | ICD-10-CM | POA: Diagnosis present

## 2017-08-14 DIAGNOSIS — F418 Other specified anxiety disorders: Secondary | ICD-10-CM | POA: Diagnosis present

## 2017-08-14 DIAGNOSIS — R5383 Other fatigue: Secondary | ICD-10-CM

## 2017-08-14 DIAGNOSIS — I509 Heart failure, unspecified: Secondary | ICD-10-CM

## 2017-08-14 DIAGNOSIS — A419 Sepsis, unspecified organism: Secondary | ICD-10-CM | POA: Diagnosis not present

## 2017-08-14 DIAGNOSIS — R0902 Hypoxemia: Secondary | ICD-10-CM | POA: Insufficient documentation

## 2017-08-14 DIAGNOSIS — Z9221 Personal history of antineoplastic chemotherapy: Secondary | ICD-10-CM | POA: Insufficient documentation

## 2017-08-14 LAB — URINALYSIS, COMPLETE (UACMP) WITH MICROSCOPIC
Bacteria, UA: NONE SEEN
Bilirubin Urine: NEGATIVE
Glucose, UA: NEGATIVE mg/dL
Hgb urine dipstick: NEGATIVE
Ketones, ur: NEGATIVE mg/dL
Leukocytes, UA: NEGATIVE
NITRITE: NEGATIVE
PH: 5 (ref 5.0–8.0)
Protein, ur: NEGATIVE mg/dL
SPECIFIC GRAVITY, URINE: 1.015 (ref 1.005–1.030)

## 2017-08-14 LAB — CBC WITH DIFFERENTIAL/PLATELET
BASOS PCT: 1 %
BASOS PCT: 0 %
Basophils Absolute: 0.1 10*3/uL (ref 0–0.1)
Basophils Absolute: 0 10*3/uL (ref 0–0.1)
EOS ABS: 0.8 10*3/uL — AB (ref 0–0.7)
EOS ABS: 0.7 10*3/uL (ref 0–0.7)
EOS PCT: 6 %
Eosinophils Relative: 6 %
HCT: 30.1 % — ABNORMAL LOW (ref 35.0–47.0)
HCT: 28.9 % — ABNORMAL LOW (ref 35.0–47.0)
HEMOGLOBIN: 9.9 g/dL — AB (ref 12.0–16.0)
Hemoglobin: 9.6 g/dL — ABNORMAL LOW (ref 12.0–16.0)
LYMPHS ABS: 1.3 10*3/uL (ref 1.0–3.6)
Lymphocytes Relative: 9 %
Lymphocytes Relative: 10 %
Lymphs Abs: 1.2 10*3/uL (ref 1.0–3.6)
MCH: 30.9 pg (ref 26.0–34.0)
MCH: 31.2 pg (ref 26.0–34.0)
MCHC: 32.9 g/dL (ref 32.0–36.0)
MCHC: 33.1 g/dL (ref 32.0–36.0)
MCV: 94 fL (ref 80.0–100.0)
MCV: 94.1 fL (ref 80.0–100.0)
MONOS PCT: 5 %
Monocytes Absolute: 0.5 10*3/uL (ref 0.2–0.9)
Monocytes Absolute: 0.6 10*3/uL (ref 0.2–0.9)
Monocytes Relative: 4 %
NEUTROS PCT: 80 %
Neutro Abs: 10.4 10*3/uL — ABNORMAL HIGH (ref 1.4–6.5)
Neutro Abs: 10.5 10*3/uL — ABNORMAL HIGH (ref 1.4–6.5)
Neutrophils Relative %: 79 %
PLATELETS: 188 10*3/uL (ref 150–440)
Platelets: 205 10*3/uL (ref 150–440)
RBC: 3.2 MIL/uL — AB (ref 3.80–5.20)
RBC: 3.08 MIL/uL — AB (ref 3.80–5.20)
RDW: 15.3 % — ABNORMAL HIGH (ref 11.5–14.5)
RDW: 15.2 % — ABNORMAL HIGH (ref 11.5–14.5)
WBC: 12.9 10*3/uL — AB (ref 3.6–11.0)
WBC: 13.1 10*3/uL — AB (ref 3.6–11.0)

## 2017-08-14 LAB — COMPREHENSIVE METABOLIC PANEL
ALBUMIN: 3.3 g/dL — AB (ref 3.5–5.0)
ALT: 17 U/L (ref 0–44)
ALT: 18 U/L (ref 0–44)
ANION GAP: 12 (ref 5–15)
ANION GAP: 9 (ref 5–15)
AST: 21 U/L (ref 15–41)
AST: 23 U/L (ref 15–41)
Albumin: 3.3 g/dL — ABNORMAL LOW (ref 3.5–5.0)
Alkaline Phosphatase: 55 U/L (ref 38–126)
Alkaline Phosphatase: 51 U/L (ref 38–126)
BILIRUBIN TOTAL: 0.5 mg/dL (ref 0.3–1.2)
BILIRUBIN TOTAL: 0.4 mg/dL (ref 0.3–1.2)
BUN: 58 mg/dL — ABNORMAL HIGH (ref 8–23)
BUN: 55 mg/dL — ABNORMAL HIGH (ref 8–23)
CALCIUM: 7.6 mg/dL — AB (ref 8.9–10.3)
CALCIUM: 7.2 mg/dL — AB (ref 8.9–10.3)
CO2: 16 mmol/L — AB (ref 22–32)
CO2: 17 mmol/L — AB (ref 22–32)
CREATININE: 3.36 mg/dL — AB (ref 0.44–1.00)
Chloride: 104 mmol/L (ref 98–111)
Chloride: 106 mmol/L (ref 98–111)
Creatinine, Ser: 4.02 mg/dL — ABNORMAL HIGH (ref 0.44–1.00)
GFR calc non Af Amer: 11 mL/min — ABNORMAL LOW (ref >60)
GFR calc non Af Amer: 13 mL/min — ABNORMAL LOW (ref >60)
GFR, EST AFRICAN AMERICAN: 12 mL/min — AB (ref >60)
GFR, EST AFRICAN AMERICAN: 15 mL/min — AB (ref >60)
Glucose, Bld: 125 mg/dL — ABNORMAL HIGH (ref 70–99)
Glucose, Bld: 114 mg/dL — ABNORMAL HIGH (ref 70–99)
POTASSIUM: 4 mmol/L (ref 3.5–5.1)
Potassium: 4.1 mmol/L (ref 3.5–5.1)
SODIUM: 132 mmol/L — AB (ref 135–145)
SODIUM: 132 mmol/L — AB (ref 135–145)
TOTAL PROTEIN: 7.1 g/dL (ref 6.5–8.1)
TOTAL PROTEIN: 7 g/dL (ref 6.5–8.1)

## 2017-08-14 LAB — MRSA PCR SCREENING: MRSA BY PCR: NEGATIVE

## 2017-08-14 LAB — LACTIC ACID, PLASMA
Lactic Acid, Venous: 1.1 mmol/L (ref 0.5–1.9)
Lactic Acid, Venous: 0.7 mmol/L (ref 0.5–1.9)

## 2017-08-14 LAB — TROPONIN I: Troponin I: <0.03 ng/mL (ref <0.03)

## 2017-08-14 LAB — MAGNESIUM: Magnesium: 1.2 mg/dL — ABNORMAL LOW (ref 1.7–2.4)

## 2017-08-14 LAB — AMMONIA: Ammonia: 26 umol/L (ref 9–35)

## 2017-08-14 MED ORDER — PROCHLORPERAZINE MALEATE 10 MG PO TABS
10.0000 mg | ORAL_TABLET | Freq: Four times a day (QID) | ORAL | Status: DC | PRN
  Administered 2017-08-15: 10 mg via ORAL
  Filled 2017-08-14 (×3): qty 1

## 2017-08-14 MED ORDER — MORPHINE SULFATE ER 15 MG PO TBCR
15.0000 mg | EXTENDED_RELEASE_TABLET | Freq: Two times a day (BID) | ORAL | Status: DC
  Administered 2017-08-15 – 2017-08-16 (×2): 15 mg via ORAL
  Filled 2017-08-14 (×4): qty 1

## 2017-08-14 MED ORDER — PANTOPRAZOLE SODIUM 40 MG PO TBEC
40.0000 mg | DELAYED_RELEASE_TABLET | Freq: Every day | ORAL | Status: DC
  Administered 2017-08-14 – 2017-08-16 (×3): 40 mg via ORAL
  Filled 2017-08-14 (×3): qty 1

## 2017-08-14 MED ORDER — SODIUM CHLORIDE 0.9 % IV BOLUS
1000.0000 mL | Freq: Once | INTRAVENOUS | Status: AC
  Administered 2017-08-14: 1000 mL via INTRAVENOUS

## 2017-08-14 MED ORDER — SODIUM CHLORIDE 0.9 % IV SOLN
Freq: Once | INTRAVENOUS | Status: AC
  Administered 2017-08-14: 11:00:00 via INTRAVENOUS
  Filled 2017-08-14: qty 1000

## 2017-08-14 MED ORDER — BENZONATATE 100 MG PO CAPS
100.0000 mg | ORAL_CAPSULE | Freq: Three times a day (TID) | ORAL | Status: DC | PRN
Start: 2017-08-14 — End: 2017-08-16

## 2017-08-14 MED ORDER — METOCLOPRAMIDE HCL 5 MG PO TABS
10.0000 mg | ORAL_TABLET | Freq: Four times a day (QID) | ORAL | Status: DC
  Administered 2017-08-14 – 2017-08-16 (×6): 10 mg via ORAL
  Filled 2017-08-14 (×7): qty 2

## 2017-08-14 MED ORDER — ARIPIPRAZOLE 5 MG PO TABS
5.0000 mg | ORAL_TABLET | Freq: Every day | ORAL | Status: DC
Start: 2017-08-14 — End: 2017-08-16
  Administered 2017-08-15 – 2017-08-16 (×2): 5 mg via ORAL
  Filled 2017-08-14 (×3): qty 1

## 2017-08-14 MED ORDER — IPRATROPIUM-ALBUTEROL 0.5-2.5 (3) MG/3ML IN SOLN: 3.0000 mL | RESPIRATORY_TRACT | Status: DC | PRN

## 2017-08-14 MED ORDER — MOMETASONE FURO-FORMOTEROL FUM 200-5 MCG/ACT IN AERO
2.0000 | INHALATION_SPRAY | Freq: Two times a day (BID) | RESPIRATORY_TRACT | Status: DC
  Administered 2017-08-15 – 2017-08-16 (×2): 2 via RESPIRATORY_TRACT
  Filled 2017-08-14: qty 8.8

## 2017-08-14 MED ORDER — LORAZEPAM 0.5 MG PO TABS
0.5000 mg | ORAL_TABLET | ORAL | Status: DC | PRN
Start: 2017-08-14 — End: 2017-08-16

## 2017-08-14 MED ORDER — SODIUM CHLORIDE 0.9 % IV SOLN
2.0000 g | INTRAVENOUS | Status: DC
  Filled 2017-08-14: qty 2

## 2017-08-14 MED ORDER — ACETAMINOPHEN 325 MG PO TABS: 650.0000 mg | ORAL_TABLET | Freq: Four times a day (QID) | ORAL | Status: DC | PRN

## 2017-08-14 MED ORDER — SODIUM CHLORIDE 0.9 % IV SOLN
INTRAVENOUS | Status: DC
  Administered 2017-08-14 – 2017-08-16 (×4): via INTRAVENOUS

## 2017-08-14 MED ORDER — MORPHINE SULFATE (PF) 4 MG/ML IV SOLN
4.0000 mg | INTRAVENOUS | Status: DC | PRN
  Administered 2017-08-14 – 2017-08-16 (×5): 4 mg via INTRAVENOUS
  Filled 2017-08-14 (×6): qty 1

## 2017-08-14 MED ORDER — ACETAMINOPHEN 650 MG RE SUPP: 650.0000 mg | Freq: Four times a day (QID) | RECTAL | Status: DC | PRN

## 2017-08-14 MED ORDER — PROMETHAZINE HCL 25 MG PO TABS
25.0000 mg | ORAL_TABLET | Freq: Three times a day (TID) | ORAL | Status: DC | PRN
  Administered 2017-08-15: 11:00:00 25 mg via ORAL
  Filled 2017-08-14 (×3): qty 1

## 2017-08-14 MED ORDER — SODIUM CHLORIDE 0.9 % IV SOLN
2.0000 g | Freq: Once | INTRAVENOUS | Status: AC
  Administered 2017-08-14: 2 g via INTRAVENOUS
  Filled 2017-08-14: qty 2

## 2017-08-14 MED ORDER — HEPARIN SODIUM (PORCINE) 5000 UNIT/ML IJ SOLN
5000.0000 [IU] | Freq: Three times a day (TID) | INTRAMUSCULAR | Status: DC
  Administered 2017-08-14 – 2017-08-16 (×5): 5000 [IU] via SUBCUTANEOUS
  Filled 2017-08-14 (×5): qty 1

## 2017-08-14 MED ORDER — SODIUM CHLORIDE 0.9 % IV BOLUS
500.0000 mL | Freq: Once | INTRAVENOUS | Status: AC
  Administered 2017-08-14: 500 mL via INTRAVENOUS

## 2017-08-14 MED ORDER — DULOXETINE HCL 30 MG PO CPEP
60.0000 mg | ORAL_CAPSULE | Freq: Every day | ORAL | Status: DC
  Administered 2017-08-14 – 2017-08-16 (×3): 60 mg via ORAL
  Filled 2017-08-14 (×3): qty 2

## 2017-08-14 MED ORDER — ONDANSETRON HCL 4 MG/2ML IJ SOLN: 4.0000 mg | Freq: Once | INTRAMUSCULAR | Status: DC

## 2017-08-14 MED ORDER — SENNOSIDES-DOCUSATE SODIUM 8.6-50 MG PO TABS: 1.0000 | ORAL_TABLET | Freq: Every evening | ORAL | Status: DC | PRN

## 2017-08-14 MED ORDER — VANCOMYCIN HCL IN DEXTROSE 1-5 GM/200ML-% IV SOLN
1000.0000 mg | Freq: Once | INTRAVENOUS | Status: AC
  Administered 2017-08-14: 1000 mg via INTRAVENOUS
  Filled 2017-08-14: qty 200

## 2017-08-14 NOTE — Progress Notes (Signed)
Pharmacy Antibiotic Note  Grace [name] is a 67 y.o. female admitted on 08/14/2017 with sepsis/ lung Cancer/?PNA  Pharmacy has been consulted for Vancomycin and Cefepime dosing.  Hx lung cancer with mets- on immunotherapy.   Plan: Patient received Vancomycin 1 gram IV x 1 in ER and Cefepime 2 gram IV x 1 in ER. Goal Vancomycin trough 15-20 mcg/ml. Patient with AKI, Crcl 18.4 ml/min, Scr 3.36, Will order Vancomycin random level for 24 hours after dose to determine next dose, as patient has unstable renal fxn at this time.  Cefepime 2 gram IV q24h    Height: 5\' 5"  (165.1 cm) Weight: 202 lb (91.6 kg) IBW/kg (Calculated) : 57  Temp (24hrs), Avg:97.2 F (36.2 C), Min:96.7 F (35.9 C), Max:98.2 F (36.8 C)  Recent Labs  Lab 08/14/17 1108 08/14/17 1312  WBC 12.9* 13.1*  CREATININE 4.02* 3.36*  LATICACIDVEN  --  1.1    Estimated Creatinine Clearance: 18.4 mL/min (A) (by C-G formula based on SCr of 3.36 mg/dL (H)).    Allergies  Allergen Reactions  . Fish Allergy Anaphylaxis    Per pt after eating [facility] she had throat swelling and SOB. MSY   . Fentanyl     Overly sedated and groggy, ineffective     Antimicrobials this admission: Vanc 7/1 >>   cefepime 7/1 >>    Dose adjustments this admission:    Microbiology results: 7/1 BCx: pending 7/1 UCx: pending    Sputum:    7/1 MRSA PCR: pending 7/1 CXR- did not show PNA?  Thank you for allowing pharmacy to be a part of this patient's care.  Allia Wiltsey A 08/14/2017 5:04 PM

## 2017-08-14 NOTE — ED Provider Notes (Addendum)
[facility] Emergency Department Provider Note    First MD Initiated Contact with Patient 08/14/17 1244     (approximate)  I have reviewed the triage vital signs and the nursing notes.   HISTORY  Chief Complaint Altered Mental Status    HPI Grace [name] is a 67 y.o. female on chemotherapy for lung cancer with mets to bone and liver presents to the ER very confused but found in clinic to be profoundly hypotensive and weak.  Family states that she is had similar presentations related to urinary tract infection.  She is currently on immunotherapy.  Denies any shortness of breath or chest pain.  No numbness or tingling.  Was found to be hypotensive to 25W systolic.  Does not wear home oxygen.    Past Medical History:  Diagnosis Date  . Anxiety   . Arthritis   . Asthma    COPD  . Cancer ([place])    Liver  . CAP (community acquired pneumonia) 06/30/2014  . Cardiomyopathy ([place])    nonischemic (EF 35-40%)  . CHF (congestive heart failure) ([place])   . COPD (chronic obstructive pulmonary disease) ([place])   . Depression   . Dyspnea   . GERD (gastroesophageal reflux disease)   . Hyperlipidemia   . Hypertension   . Pneumonia   . PVC's (premature ventricular contractions)    states 10 years ago  . Sepsis ([place]) 06/30/2014  . SOB (shortness of breath) 05/08/2014  . Spinal cord injury at T7-T12 level [facility])    2016   Family History  Problem Relation Age of Onset  . Heart attack Father 63       MI x 3    Past Surgical History:  Procedure Laterality Date  . APPENDECTOMY    . CARDIAC CATHETERIZATION    . CATARACT EXTRACTION     right eye   . CESAREAN SECTION    . COLONOSCOPY    . LAMINOTOMY    . LUMBAR LAMINECTOMY/DECOMPRESSION MICRODISCECTOMY Left 08/01/2016   Procedure: Left Lumbar Four-Five Laminectomy and Foraminotomy;  Surgeon: Earnie Larsson, MD;  Location: [facility];  Service: Neurosurgery;  Laterality: Left;  . PORTA CATH INSERTION N/A 02/03/2017   Procedure: PORTA CATH INSERTION;  Surgeon: Katha Cabal, MD;  Location: [facility];  Service: Cardiovascular;  Laterality: N/A;   Patient Active Problem List   Diagnosis Date Noted  . Hyponatremia 04/04/2017  . 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 Liver mass 01/10/2017  . Chronic acromioclavicular pain (Right) 12/27/2016  . Osteoarthritis acromioclavicular joint (Right) 12/27/2016  . Osteoarthritis glenohumeral joint (Left) 12/27/2016  . Medial epicondylitis of elbow (Right) 12/27/2016  . Chronic shoulder pain (Bilateral) (R>L) 12/26/2016  . Chronic elbow pain (Right) 12/26/2016  . Long term prescription benzodiazepine use 12/07/2016  . Long term prescription opiate use 12/07/2016  . Opiate use 12/07/2016  . NSAID long-term use 12/07/2016  . Osteoarthritis of knees (Bilateral) 11/22/2016  . Thoracic compression fracture (T4, T5, T6, and T7) 11/22/2016  . Breast cyst 11/21/2016  . Cardiomyopathy ([place]) 11/21/2016  . Diabetes mellitus type 2, uncomplicated ([place]) 38/93/7342  . Fibroids 11/21/2016  . Hidradenitis 11/21/2016  . Nicotine abuse 11/21/2016  . Osteoarthritis cervical spine 11/21/2016  . Osteoarthritis of hands (Bilateral) 11/21/2016  . PUD (peptic ulcer disease) 11/21/2016  . Pulmonary nodule 11/21/2016  . Chronic knee pain (Primary Area of Pain) (Bilateral) 11/21/2016  . Chronic pain syndrome 11/21/2016  . Lumbar stenosis with neurogenic claudication 08/01/2016  .  Systolic dysfunction with acute on chronic heart failure ([place])   . CHF (congestive heart failure), NYHA class I ([place]) 08/22/2014  . GERD (gastroesophageal reflux disease) 06/30/2014  . Depression 06/30/2014  . PVC's (premature ventricular contractions) 05/08/2014  . 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 Obesity 05/08/2014  . Tachycardia 05/08/2014  . 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 Authorizing Provider  ADVAIR DISKUS 250-50 MCG/DOSE AEPB Inhale 1 puff into the lungs 2 (two) times daily. 04/06/17  Yes [provider]  albuterol (PROAIR HFA) 108 (90 Base) MCG/ACT inhaler Inhale 2 puffs into the lungs every 6 (six) hours as needed for wheezing or shortness of breath.  08/09/16  Yes [provider]  alendronate (FOSAMAX) 70 MG tablet Take 70 mg by mouth every Thursday.  06/23/16  Yes [provider]  ARIPiprazole (ABILIFY) 5 MG tablet Take 5 mg by mouth daily.   Yes [provider]  benzonatate (TESSALON) 100 MG capsule Take 1 capsule (100 mg total) by mouth every 8 (eight) hours as needed for cough. 04/09/17  Yes Dustin Flock, MD  diphenoxylate-atropine (LOMOTIL) 2.5-0.025 MG tablet Take 1 tablet by mouth 4 (four) times daily as needed for diarrhea or loose stools. 04/19/17  Yes Lloyd Huger, MD  DULoxetine (CYMBALTA) 60 MG capsule Take 60 mg by mouth daily.   Yes [provider]  ipratropium-albuterol (DUONEB) 0.5-2.5 (3) MG/3ML SOLN Take 3 mLs by nebulization every 4 (four) hours as needed. 04/03/17  Yes Burns, Wandra Feinstein, NP  LORazepam (ATIVAN) 0.5 MG tablet Take 0.5 mg by mouth as needed. 04/18/17  Yes [provider]  metoprolol succinate (TOPROL-XL) 100 MG 24 hr tablet TAKE ONE TABLET BY MOUTH ONCE DAILY 08/28/15  Yes Gollan, Kathlene November, MD  morphine (MS CONTIN) 15 MG 12 hr tablet Take 1 tablet (15 mg total) by mouth every 12 (twelve) hours. 08/03/17  Yes Lloyd Huger, MD  pantoprazole (PROTONIX) 40 MG tablet Take 1 tablet by mouth daily. 05/23/17  Yes [provider]  prochlorperazine (COMPAZINE) 10 MG tablet Take 1 tablet (10 mg total) by mouth every 6 (six) hours as needed (Nausea or vomiting). 08/03/17  Yes Finnegan, Kathlene November, MD  sacubitril-valsartan (ENTRESTO) 49-51 MG Takes 1 1/2 tablet by mouth twice daily. Patient taking differently:  Takes 1 tablet in AM and 1/2 tablet in PM 05/29/17  Yes Gollan, Kathlene November, MD  metoCLOPramide (REGLAN) 10 MG tablet Take 1 tablet (10 mg total) by mouth 4 (four) times daily. 08/03/17   Lloyd Huger, MD  potassium chloride SA (K-DUR,KLOR-CON) 20 MEQ tablet Take 1 tablet (20 mEq total) by mouth daily. Patient not taking: Reported on 08/14/2017 06/05/17   Lloyd Huger, MD  promethazine (PHENERGAN) 25 MG tablet Take 1 tablet (25 mg total) by mouth every 8 (eight) hours as needed for nausea or vomiting. 08/03/17   Lloyd Huger, MD    Allergies Fish allergy and Fentanyl    Social History Social History   Tobacco Use  . Smoking status: Former Smoker    Packs/day: 0.50    Years: 50.00    Pack years: 25.00    Types: Cigarettes    Last attempt to quit: 01/27/2015    Years since quitting: 2.5  . Smokeless tobacco: Never Used  Substance Use Topics  . Alcohol use: No  . Drug use:  No    Review of Systems Patient denies headaches, rhinorrhea, blurry vision, numbness, shortness of breath, chest pain, edema, cough, abdominal pain, nausea, vomiting, diarrhea, dysuria, fevers, rashes or hallucinations unless otherwise stated above in HPI. ____________________________________________   PHYSICAL EXAM:  VITAL SIGNS: Vitals:   08/14/17 1415 08/14/17 1528  BP: (!) 158/134 (!) 91/58  Pulse:  86  Resp: 18 15  Temp:    SpO2:  98%    Constitutional: Alert and oriented but drowsy and fatigued appearing Eyes: Conjunctivae are normal.  Head: Atraumatic. Nose: No congestion/rhinnorhea. Mouth/Throat: Mucous membranes are moist.   Neck: No stridor. Painless ROM.  Cardiovascular: Normal rate, regular rhythm. Grossly normal heart sounds.  Good peripheral circulation. Respiratory: Normal respiratory effort.  No retractions. Lungs  With coarse bibasilar breathsounds Gastrointestinal: Soft and nontender. No distention. No abdominal bruits. No CVA tenderness. Genitourinary:  deferred Musculoskeletal: No lower extremity tenderness nor edema.  No joint effusions. Neurologic:  Normal speech and language. No gross focal neurologic deficits are appreciated. No facial droop Skin:  Skin is warm, dry and intact. No rash noted. Psychiatric: Mood and affect are normal. Speech and behavior are normal.  ____________________________________________   LABS (all labs ordered are listed, but only abnormal results are displayed)  Results for orders placed or performed during the hospital encounter of 08/14/17 (from the past 24 hour(s))  Lactic acid, plasma     Status: None   Collection Time: 08/14/17  1:12 PM  Result Value Ref Range   Lactic Acid, Venous 1.1 0.5 - 1.9 mmol/L  Comprehensive metabolic panel     Status: Abnormal   Collection Time: 08/14/17  1:12 PM  Result Value Ref Range   Sodium 132 (L) 135 - 145 mmol/L   Potassium 4.1 3.5 - 5.1 mmol/L   Chloride 106 98 - 111 mmol/L   CO2 17 (L) 22 - 32 mmol/L   Glucose, Bld 114 (H) 70 - 99 mg/dL   BUN 55 (H) 8 - 23 mg/dL   Creatinine, Ser 3.36 (H) 0.44 - 1.00 mg/dL   Calcium 7.2 (L) 8.9 - 10.3 mg/dL   Total Protein 7.0 6.5 - 8.1 g/dL   Albumin 3.3 (L) 3.5 - 5.0 g/dL   AST 23 15 - 41 U/L   ALT 18 0 - 44 U/L   Alkaline Phosphatase 51 38 - 126 U/L   Total Bilirubin 0.4 0.3 - 1.2 mg/dL   GFR calc non Af Amer 13 (L) >60 mL/min   GFR calc Af Amer 15 (L) >60 mL/min   Anion gap 9 5 - 15  Troponin I     Status: None   Collection Time: 08/14/17  1:12 PM  Result Value Ref Range   Troponin I <0.03 <0.03 ng/mL  CBC WITH DIFFERENTIAL     Status: Abnormal   Collection Time: 08/14/17  1:12 PM  Result Value Ref Range   WBC 13.1 (H) 3.6 - 11.0 K/uL   RBC 3.08 (L) 3.80 - 5.20 MIL/uL   Hemoglobin 9.6 (L) 12.0 - 16.0 g/dL   HCT 28.9 (L) 35.0 - 47.0 %   MCV 94.1 80.0 - 100.0 fL   MCH 31.2 26.0 - 34.0 pg   MCHC 33.1 32.0 - 36.0 g/dL   RDW 15.2 (H) 11.5 - 14.5 %   Platelets 188 150 - 440 K/uL   Neutrophils Relative % 79 %    Neutro Abs 10.5 (H) 1.4 - 6.5 K/uL   Lymphocytes Relative 10 %   Lymphs Abs 1.3  1.0 - 3.6 K/uL   Monocytes Relative 5 %   Monocytes Absolute 0.6 0.2 - 0.9 K/uL   Eosinophils Relative 6 %   Eosinophils Absolute 0.7 0 - 0.7 K/uL   Basophils Relative 0 %   Basophils Absolute 0.0 0 - 0.1 K/uL  Urinalysis, Complete w Microscopic     Status: Abnormal   Collection Time: 08/14/17  1:12 PM  Result Value Ref Range   Color, Urine YELLOW (A) YELLOW   APPearance HAZY (A) CLEAR   Specific Gravity, Urine 1.015 1.005 - 1.030   pH 5.0 5.0 - 8.0   Glucose, UA NEGATIVE NEGATIVE mg/dL   Hgb urine dipstick NEGATIVE NEGATIVE   Bilirubin Urine NEGATIVE NEGATIVE   Ketones, ur NEGATIVE NEGATIVE mg/dL   Protein, ur NEGATIVE NEGATIVE mg/dL   Nitrite NEGATIVE NEGATIVE   Leukocytes, UA NEGATIVE NEGATIVE   RBC / HPF 0-5 0 - 5 RBC/hpf   WBC, UA 6-10 0 - 5 WBC/hpf   Bacteria, UA NONE SEEN NONE SEEN   Squamous Epithelial / LPF 0-5 0 - 5   Mucus PRESENT    Hyaline Casts, UA PRESENT    Amorphous Crystal PRESENT    ____________________________________________  EKG My review and personal interpretation at Time: 12:58   Indication: hypotension  Rate: 90  Rhythm: sinus Axis: normal Other: normal intervals, no stemi ____________________________________________  RADIOLOGY  I personally reviewed all radiographic images ordered to evaluate for the above acute complaints and reviewed radiology reports and findings.  These findings were personally discussed with the patient.  Please see medical record for radiology report.  ____________________________________________   PROCEDURES  Procedure(s) performed:  .Critical Care Performed by: Merlyn Lot, MD Authorized by: Merlyn Lot, MD   Critical care provider statement:    Critical care time (minutes):  30   Critical care time was exclusive of:  Separately billable procedures and treating other patients   Critical care was necessary to treat or  prevent imminent or life-threatening deterioration of the following conditions:  Dehydration and shock   Critical care was time spent personally by me on the following activities:  Development of treatment plan with patient or surrogate, discussions with consultants, evaluation of patient's response to treatment, examination of patient, obtaining history from patient or surrogate, ordering and performing treatments and interventions, ordering and review of laboratory studies, ordering and review of radiographic studies, pulse oximetry, re-evaluation of patient's condition and review of old charts      Critical Care performed: yes ____________________________________________   INITIAL IMPRESSION / [place] / ED COURSE  Pertinent labs & imaging results that were available during my care of the patient were reviewed by me and considered in my medical decision making (see chart for details).   DDX: Sepsis, dehydration, anemia, lecture light abnormality, metabolic derangement, ACS, heart failure,  Roxi Hlavaty Wittmeyer is a 67 y.o. who presents to the ED with hypotension as described above.  Patient is ill-appearing with complex past medical history further complicate a by history of cardiomyopathy as well as metastatic lung cancer.  Patient will be given IV fluids for rehydration.  Abdominal exam is soft and benign.  Blood work sent for the above differential concern for sepsis.  The patient will be placed on continuous pulse oximetry and telemetry for monitoring.  Laboratory evaluation will be sent to evaluate for the above complaints.     Clinical Course as of Aug 14 1548  Mon Aug 14, 2017  1448 Significant AK I with metabolic acidosis  but normal anion gap.  Patient received IV antibiotics.  Is receiving IV fluids.  Does have some persistent right flank pain and states that she has been taking Motrin.  Will order CT imaging to exclude obstructive nephropathy.   [PR]  1539 At this point  this does not seem clinically consistent with sepsis she has no fever did receive initial empiric dosing.  I sent off procalcitonin further risk stratify.  Seems less clinically consistent with infectious process more likely hypovolemia and dehydration.  Patient will require hospitalization for further IV fluids and medical management.  Have discussed with the patient and available family all diagnostics and treatments performed thus far and all questions were answered to the best of my ability. The patient demonstrates understanding and agreement with plan.    [PR]    Clinical Course User Index [PR] Merlyn Lot, MD     As part of my medical decision making, I reviewed the following data within the [place] notes reviewed and incorporated, Labs reviewed, notes from prior ED visits.   ____________________________________________   FINAL CLINICAL IMPRESSION(S) / ED DIAGNOSES  Final diagnoses:  Hypotension due to hypovolemia  AKI (acute kidney injury) ([place])      NEW MEDICATIONS STARTED DURING THIS VISIT:  New Prescriptions   No medications on file     Note:  This document was prepared using Dragon voice recognition software and may include unintentional dictation errors.    Merlyn Lot, MD 08/14/17 1549    Merlyn Lot, MD 08/14/17 [place], MD 08/28/17 [phone]

## 2017-08-14 NOTE — H&P (Signed)
[facility] [place] [facility] NAME: Grace [name]    MR#:  [id number]  DATE OF BIRTH:  [birth year]  DATE OF ADMISSION:  08/14/2017  PRIMARY CARE PHYSICIAN: [name], MD   REQUESTING/REFERRING PHYSICIAN:   CHIEF COMPLAINT:   Chief Complaint  Patient presents with  . Altered Mental Status    HISTORY OF PRESENT ILLNESS: Grace [name]  is a 67 y.o. female with a known history of metastatic lung cancer, COPD, congestive heart failure, hyperlipidemia, hypertension was referred from oncology clinic for confusion and low blood pressure.  Patient currently on immunotherapy.  Her systolic blood pressure was around 60 mmHg in the oncology clinic and she was referred to the emergency room.  She was given IV fluid bolus.  Her confusion improved in the emergency room.  Code sepsis was called patient was given IV fluids and started on broad-spectrum IV antibiotics.  No complaints of any shortness of breath.  No headache.  Hospitalist service was consulted for further care.  Patient was worked up with CT abdomen which showed liver and bone mets but no obstruction.  PAST MEDICAL HISTORY:   Past Medical History:  Diagnosis Date  . Anxiety   . Arthritis   . Asthma    COPD  . Cancer ([place])    Liver  . CAP (community acquired pneumonia) 06/30/2014  . Cardiomyopathy ([place])    nonischemic (EF 35-40%)  . CHF (congestive heart failure) ([place])   . COPD (chronic obstructive pulmonary disease) ([place])   . Depression   . Dyspnea   . GERD (gastroesophageal reflux disease)   . Hyperlipidemia   . Hypertension   . Pneumonia   . PVC's (premature ventricular contractions)    states 10 years ago  . Sepsis ([place]) 06/30/2014  . SOB (shortness of breath) 05/08/2014  . Spinal cord injury at T7-T12 level [facility])    2016    PAST SURGICAL HISTORY:  Past Surgical History:  Procedure Laterality Date  . APPENDECTOMY    . CARDIAC CATHETERIZATION    . CATARACT EXTRACTION      right eye   . CESAREAN SECTION    . COLONOSCOPY    . LAMINOTOMY    . LUMBAR LAMINECTOMY/DECOMPRESSION MICRODISCECTOMY Left 08/01/2016   Procedure: Left Lumbar Four-Five Laminectomy and Foraminotomy;  Surgeon: Earnie Larsson, MD;  Location: [place];  Service: Neurosurgery;  Laterality: Left;  . PORTA CATH INSERTION N/A 02/03/2017   Procedure: PORTA CATH INSERTION;  Surgeon: Katha Cabal, MD;  Location: [place] [facility];  Service: Cardiovascular;  Laterality: N/A;    SOCIAL HISTORY:  Social History   Tobacco Use  . Smoking status: Former Smoker    Packs/day: 0.50    Years: 50.00    Pack years: 25.00    Types: Cigarettes    Last attempt to quit: 01/27/2015    Years since quitting: 2.5  . Smokeless tobacco: Never Used  Substance Use Topics  . Alcohol use: No    FAMILY HISTORY:  Family History  Problem Relation Age of Onset  . Heart attack Father 58       MI x 3     DRUG ALLERGIES:  Allergies  Allergen Reactions  . Fish Allergy Anaphylaxis    Per pt after eating [facility] she had throat swelling and SOB. MSY   . Fentanyl     Overly sedated and groggy, ineffective     REVIEW OF SYSTEMS:   CONSTITUTIONAL: No fever, has fatigue  and weakness.  EYES: No blurred or double vision.  EARS, NOSE, AND THROAT: No tinnitus or ear pain.  RESPIRATORY: No cough, shortness of breath, wheezing or hemoptysis.  CARDIOVASCULAR: No chest pain, orthopnea, edema.  GASTROINTESTINAL: No nausea, vomiting, diarrhea or abdominal pain.  GENITOURINARY: No dysuria, hematuria.  ENDOCRINE: No polyuria, nocturia,  HEMATOLOGY: No anemia, easy bruising or bleeding SKIN: No rash or lesion. MUSCULOSKELETAL: No joint pain or arthritis.   NEUROLOGIC: No tingling, numbness, weakness.  Was confused this am PSYCHIATRY: No anxiety or depression.   MEDICATIONS AT HOME:  Prior to Admission medications   Medication Sig Start Date End Date Taking? Authorizing Provider  ADVAIR DISKUS 250-50 MCG/DOSE AEPB  Inhale 1 puff into the lungs 2 (two) times daily. 04/06/17  Yes [provider]  albuterol (PROAIR HFA) 108 (90 Base) MCG/ACT inhaler Inhale 2 puffs into the lungs every 6 (six) hours as needed for wheezing or shortness of breath.  08/09/16  Yes [provider]  alendronate (FOSAMAX) 70 MG tablet Take 70 mg by mouth every Thursday.  06/23/16  Yes [provider]  ARIPiprazole (ABILIFY) 5 MG tablet Take 5 mg by mouth daily.   Yes [provider]  benzonatate (TESSALON) 100 MG capsule Take 1 capsule (100 mg total) by mouth every 8 (eight) hours as needed for cough. 04/09/17  Yes Dustin Flock, MD  diphenoxylate-atropine (LOMOTIL) 2.5-0.025 MG tablet Take 1 tablet by mouth 4 (four) times daily as needed for diarrhea or loose stools. 04/19/17  Yes Lloyd Huger, MD  DULoxetine (CYMBALTA) 60 MG capsule Take 60 mg by mouth daily.   Yes [provider]  ipratropium-albuterol (DUONEB) 0.5-2.5 (3) MG/3ML SOLN Take 3 mLs by nebulization every 4 (four) hours as needed. 04/03/17  Yes Burns, Wandra Feinstein, NP  LORazepam (ATIVAN) 0.5 MG tablet Take 0.5 mg by mouth as needed. 04/18/17  Yes [provider]  metoprolol succinate (TOPROL-XL) 100 MG 24 hr tablet TAKE ONE TABLET BY MOUTH ONCE DAILY 08/28/15  Yes Gollan, Kathlene November, MD  morphine (MS CONTIN) 15 MG 12 hr tablet Take 1 tablet (15 mg total) by mouth every 12 (twelve) hours. 08/03/17  Yes Lloyd Huger, MD  pantoprazole (PROTONIX) 40 MG tablet Take 1 tablet by mouth daily. 05/23/17  Yes [provider]  prochlorperazine (COMPAZINE) 10 MG tablet Take 1 tablet (10 mg total) by mouth every 6 (six) hours as needed (Nausea or vomiting). 08/03/17  Yes Finnegan, Kathlene November, MD  sacubitril-valsartan (ENTRESTO) 49-51 MG Takes 1 1/2 tablet by mouth twice daily. Patient taking differently: Takes 1 tablet in AM and 1/2 tablet in PM 05/29/17  Yes Gollan, Kathlene November, MD  metoCLOPramide (REGLAN) 10 MG tablet Take 1  tablet (10 mg total) by mouth 4 (four) times daily. 08/03/17   Lloyd Huger, MD  potassium chloride SA (K-DUR,KLOR-CON) 20 MEQ tablet Take 1 tablet (20 mEq total) by mouth daily. Patient not taking: Reported on 08/14/2017 06/05/17   Lloyd Huger, MD  promethazine (PHENERGAN) 25 MG tablet Take 1 tablet (25 mg total) by mouth every 8 (eight) hours as needed for nausea or vomiting. 08/03/17   Lloyd Huger, MD      PHYSICAL EXAMINATION:   VITAL SIGNS: Blood pressure (!) 94/50, pulse 85, temperature 98.2 F (36.8 C), temperature source Oral, resp. rate 19, height 5\' 5"  (1.651 m), weight 91.6 kg (202 lb), SpO2 99 %.  GENERAL:  67 y.o.-year-old patient lying in the bed with no acute distress.  EYES: Pupils equal, round, reactive to light and accommodation. No scleral icterus. Extraocular muscles intact.  HEENT: Head atraumatic, normocephalic. Oropharynx dry and nasopharynx clear.  NECK:  Supple, no jugular venous distention. No thyroid enlargement, no tenderness.  LUNGS: Normal breath sounds bilaterally, no wheezing, rales,rhonchi or crepitation. No use of accessory muscles of respiration.  CARDIOVASCULAR: S1, S2 normal. No murmurs, rubs, or gallops.  ABDOMEN: Soft, nontender, nondistended. Bowel sounds present. No organomegaly or mass.  EXTREMITIES: No pedal edema, cyanosis, or clubbing.  NEUROLOGIC: Cranial nerves II through XII are intact. Muscle strength 5/5 in all extremities. Sensation intact. Gait not checked.  PSYCHIATRIC: The patient is alert and oriented x 3.  SKIN: No obvious rash, lesion, or ulcer.   LABORATORY PANEL:   CBC Recent Labs  Lab 08/14/17 1108 08/14/17 1312  WBC 12.9* 13.1*  HGB 9.9* 9.6*  HCT 30.1* 28.9*  PLT 205 188  MCV 94.0 94.1  MCH 30.9 31.2  MCHC 32.9 33.1  RDW 15.3* 15.2*  LYMPHSABS 1.2 1.3  MONOABS 0.5 0.6  EOSABS 0.8* 0.7  BASOSABS 0.1 0.0    ------------------------------------------------------------------------------------------------------------------  Chemistries  Recent Labs  Lab 08/14/17 1108 08/14/17 1312  NA 132* 132*  K 4.0 4.1  CL 104 106  CO2 16* 17*  GLUCOSE 125* 114*  BUN 58* 55*  CREATININE 4.02* 3.36*  CALCIUM 7.6* 7.2*  MG 1.2*  --   AST 21 23  ALT 17 18  ALKPHOS 55 51  BILITOT 0.5 0.4   ------------------------------------------------------------------------------------------------------------------ estimated creatinine clearance is 18.4 mL/min (A) (by C-G formula based on SCr of 3.36 mg/dL (H)). ------------------------------------------------------------------------------------------------------------------ No results for input(s): TSH, T4TOTAL, T3FREE, THYROIDAB in the last 72 hours.  Invalid input(s): FREET3   Coagulation profile No results for input(s): INR, PROTIME in the last 168 hours. ------------------------------------------------------------------------------------------------------------------- No results for input(s): DDIMER in the last 72 hours. -------------------------------------------------------------------------------------------------------------------  Cardiac Enzymes Recent Labs  Lab 08/14/17 1312  TROPONINI <0.03   ------------------------------------------------------------------------------------------------------------------ Invalid input(s): POCBNP  ---------------------------------------------------------------------------------------------------------------  Urinalysis    Component Value Date/Time   COLORURINE YELLOW (A) 08/14/2017 1312   APPEARANCEUR HAZY (A) 08/14/2017 1312   APPEARANCEUR Clear 02/21/2014 0510   LABSPEC 1.015 08/14/2017 1312   LABSPEC 1.024 02/21/2014 0510   PHURINE 5.0 08/14/2017 1312   GLUCOSEU NEGATIVE 08/14/2017 1312   GLUCOSEU Negative 02/21/2014 0510   HGBUR NEGATIVE 08/14/2017 1312   BILIRUBINUR NEGATIVE 08/14/2017 1312    BILIRUBINUR Negative 02/21/2014 0510   KETONESUR NEGATIVE 08/14/2017 1312   PROTEINUR NEGATIVE 08/14/2017 1312   NITRITE NEGATIVE 08/14/2017 1312   LEUKOCYTESUR NEGATIVE 08/14/2017 1312   LEUKOCYTESUR Negative 02/21/2014 0510     RADIOLOGY: Ct Abdomen Pelvis Wo Contrast  Result Date: 08/14/2017 CLINICAL DATA:  67 year old female with right flank pain, acute renal failure since last week. Altered mental status. History of extensive stage small cell lung cancer. Currently on immunotherapy. EXAM: CT ABDOMEN AND PELVIS WITHOUT CONTRAST TECHNIQUE: Multidetector CT imaging of the abdomen and pelvis was performed following the standard protocol without IV contrast. COMPARISON:  Restaging CT chest, abdomen and Pelvis 06/14/2017 and earlier. FINDINGS: Lower chest: Visible lung bases are stable and negative. No pericardial or pleural effusion. Hepatobiliary: Hypodense liver metastases are poorly visible in the absence of IV contrast. Stable liver size and contour. The gallbladder is distended today, but there is no pericholecystic inflammation. CBD size is stable. Pancreas: Stable fatty atrophy. Spleen: Stable, negative. Adrenals/Urinary Tract: Stable and normal adrenal glands. No hydronephrosis. No perinephric stranding. Stable left renal artery calcified atherosclerosis.  No hydroureter. Small left greater than right retroperitoneal lymph nodes appear stable along the course of the ureters. Mildly distended but otherwise unremarkable noncontrast urinary bladder. No perivesical stranding is evident today. Stomach/Bowel: Decompressed distal colon. Mild to moderate diverticulosis and the descending and sigmoid colon without active inflammation. Gas and stool in the transverse colon. Negative right colon and terminal ileum. Diminutive or absent appendix. No dilated small bowel. Decompressed stomach. No abdominal free air, free fluid. Vascular/Lymphatic: Extensive Aortoiliac calcified atherosclerosis. Vascular  patency is not evaluated in the absence of IV contrast. Stable small lymph nodes in the abdomen and pelvis. Reproductive: Negative noncontrast appearance. Other: No pelvic free fluid. Musculoskeletal: Scattered sclerotic bone metastases appears stable since May including in the L1, L2 and L5 vertebral bodies. Chronic L4-L5 spondylolysis and spondylolisthesis with degenerative spinal stenosis. Stable femoral head sclerosis which might be degenerative or AVN related. Stable sclerotic right proximal femur possible intertrochanteric metastasis. No new osseous abnormality. IMPRESSION: 1. No acute or inflammatory process is evident in the noncontrast abdomen or pelvis. No renal obstruction. 2. Osseous metastatic disease appears stable since 06/14/2017. Liver metastases are poorly visible without contrast. 3.  Aortic Atherosclerosis (ICD10-I70.0). Electronically Signed   By: [name] M.D.   On: 08/14/2017 15:27   Dg Chest Port 1 View  Result Date: 08/14/2017 CLINICAL DATA:  Hypotension and hypoxia EXAM: PORTABLE CHEST 1 VIEW COMPARISON:  03/22/2017 FINDINGS: Cardiac shadow is enlarged but stable. Right chest wall port is noted and stable. The patchy nodular density seen on the prior exam have significantly improved when compare with the prior study. Some persistent scarring in the left upper lobe is noted. No sizable effusion is noted. No bony abnormality is seen. IMPRESSION: Significant improvement in nodular changes when compare with the prior exam. Some mild scarring is noted again in the left upper lobe. Electronically Signed   By: [name] M.D.   On: 08/14/2017 13:12    EKG: Orders placed or performed during the hospital encounter of 08/14/17  . ED EKG 12-Lead  . ED EKG 12-Lead    IMPRESSION AND PLAN:  67 year old female patient with history of metastatic lung cancer, congestive heart failure, COPD, hyperlipidemia, hypertension presented to the emergency room for low blood pressure and  confusion  -Sepsis Start patient on broad-spectrum IV antibiotics IV fluids Follow-up cultures  -Acute kidney injury IV fluid hydration  -Dehydration IV fluids  -Altered mental status secondary to metabolic encephalopathy Improving  -DVT prophylaxis with subcu heparin  -Metastatic lung cancer Supportive care  All the records are reviewed and case discussed with ED provider. Management plans discussed with the patient, family and they are in agreement.  CODE STATUS:Full code Code Status History    Date Active Date Inactive Code Status Order ID Comments User Context   04/04/2017 1558 04/09/2017 1805 Full Code [id number]  Epifanio Lesches, MD Inpatient   08/01/2016 1057 08/01/2016 2250 Full Code [id number]  Earnie Larsson, MD Inpatient   08/22/2014 1628 08/24/2014 1458 Full Code [id number]  Epifanio Lesches, MD ED   07/15/2014 1540 07/17/2014 1921 Full Code [id number]  Earnie Larsson, MD Inpatient   06/30/2014 0520 07/02/2014 1855 Full Code [id number]  Lance Coon, MD ED       TOTAL TIME TAKING CARE OF THIS PATIENT: 51 minutes.    [name] M.D on 08/14/2017 at 4:56 PM  Between 7am to 6pm - Pager - [phone]  After 6pm go to [email or web address] - [facility]  Office  [phone]  CC: Primary care physician; [name], MD

## 2017-08-14 NOTE — ED Triage Notes (Signed)
Patient presents to ED via POV with c/o altered mental status since Thursday. Family reports this has happened before when patient had a uti. Patient also reports N/V. Patient is a lung cancer patient. Patient finished her chemo treatments 6 weeks ago. Patient is currently on immunotherapy. Mask patient on patient.

## 2017-08-14 NOTE — Progress Notes (Signed)
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 Grayland Ormond on 08/03/2017 for evaluation prior to cycle 3 of Tecentriq.  She complained of intermittent nausea and vomiting that appeared to be unrelated to her treatments.  She complained of chronic pain and requested a long-acting narcotic.  Appetite remained fair and she denied any weight loss.  Antiemetics were continued and she was given a prescription for Reglan.   Oncology History   Patient with initial diagnosis in November 2018 where she presented for an abdominal ultrasound for mildly elevated liver enzymes.  Imaging revealed multiple lesions in her liver concerning for metastatic disease.  Ordered tumor markers as well as a CT scan of the chest abdomen and pelvis to assess for primary lesion.  Had liver biopsy confirming small cell carcinoma from lung.  PET scan revealed left hilar mass consistent with small cell lung cancer, hypermetabolic mediastinal adenopathy, mass within the left hepatic lobe and skeletal metastasis with nodal and upper abdomen metastasis.  MRI of the brain was negative for metastatic disease.   They decided to pursue palliative chemotherapy with carbo/Tecentriq and etoposide.   Day 1 cycle 1 began on 02/08/2017.  Completed a total of 6 cycles finishing on 06/02/2017.  Proceeded with single agent Tecentriq beginning on Jun 21, 2017.  Today it she has had a total of 3 single Tecentriq treatments.      Small cell lung cancer ([place])   01/28/2017  Initial Diagnosis    Small cell lung cancer ([place])       Interval history- Patient presents to Symptom Management clinic for confusion.  Symptoms presented yesterday.  Onset was abrupt. Symptoms are fluctuating. Describes symptoms as severe. Associated symptoms: Urinary retention, weakness, altered mental status and bilateral flank pain. Resting makes symptoms better. Movement makes symptoms worse.  Treatments tried None Evaluation to date None  ECOG FS:3 - Symptomatic, >50% confined to bed  Review of systems- Review of Systems  Constitutional: Positive for malaise/fatigue. Negative for chills, fever and weight loss.  HENT: Negative for congestion and ear pain.   Eyes: Negative.  Negative for blurred vision and double vision.  Respiratory: Negative.  Negative for cough, sputum production and shortness of breath.   Cardiovascular: Negative.  Negative for chest pain, palpitations and leg swelling.  Gastrointestinal: Negative.  Negative for abdominal pain, constipation, diarrhea, nausea and vomiting.  Genitourinary: Negative for dysuria, frequency and urgency.  Musculoskeletal: Negative for back pain and falls.  Skin: Negative.  Negative for rash.  Neurological: Positive for weakness. Negative for headaches.       Confusion   Endo/Heme/Allergies: Negative.  Does not bruise/bleed easily.  Psychiatric/Behavioral: Negative.  Negative for depression. The patient is not nervous/anxious and does not have insomnia.      Current treatment- S/p Cycle 3 Tecentriq.   Allergies  Allergen Reactions  . Fish Allergy Anaphylaxis    Per pt after eating [facility] she had throat swelling and SOB. MSY   . Fentanyl     Overly sedated and groggy, ineffective  Past Medical History:  Diagnosis Date  . Anxiety   . Arthritis   . Asthma    COPD  . Cancer ([place])    Liver  . CAP (community acquired pneumonia) 06/30/2014  . Cardiomyopathy ([place])    nonischemic (EF 35-40%)  . CHF (congestive  heart failure) ([place])   . COPD (chronic obstructive pulmonary disease) ([place])   . Depression   . Dyspnea   . GERD (gastroesophageal reflux disease)   . Hyperlipidemia   . Hypertension   . Pneumonia   . PVC's (premature ventricular contractions)    states 10 years ago  . Sepsis ([place]) 06/30/2014  . SOB (shortness of breath) 05/08/2014  . Spinal cord injury at T7-T12 level [facility])    2016     Past Surgical History:  Procedure Laterality Date  . APPENDECTOMY    . CARDIAC CATHETERIZATION    . CATARACT EXTRACTION     right eye   . CESAREAN SECTION    . COLONOSCOPY    . LAMINOTOMY    . LUMBAR LAMINECTOMY/DECOMPRESSION MICRODISCECTOMY Left 08/01/2016   Procedure: Left Lumbar Four-Five Laminectomy and Foraminotomy;  Surgeon: Earnie Larsson, MD;  Location: [facility];  Service: Neurosurgery;  Laterality: Left;  . PORTA CATH INSERTION N/A 02/03/2017   Procedure: PORTA CATH INSERTION;  Surgeon: Katha Cabal, MD;  Location: [place] [facility];  Service: Cardiovascular;  Laterality: N/A;    Social History   Socioeconomic History  . Marital status: Married    Spouse name: Not on file  . Number of children: Not on file  . Years of education: Not on file  . Highest education level: Not on file  Occupational History  . Not on file  Social Needs  . Financial resource strain: Not on file  . Food insecurity:    Worry: Not on file    Inability: Not on file  . Transportation needs:    Medical: Not on file    Non-medical: Not on file  Tobacco Use  . Smoking status: Former Smoker    Packs/day: 0.50    Years: 50.00    Pack years: 25.00    Types: Cigarettes    Last attempt to quit: 01/27/2015    Years since quitting: 2.5  . Smokeless tobacco: Never Used  Substance and Sexual Activity  . Alcohol use: No  . Drug use: No  . Sexual activity: Not Currently  Lifestyle  . Physical activity:    Days per week: Not on file    Minutes per session: Not on file  . Stress: Not on file    Relationships  . Social connections:    Talks on phone: Not on file    Gets together: Not on file    Attends religious service: Not on file    Active member of club or organization: Not on file    Attends meetings of clubs or organizations: Not on file    Relationship status: Not on file  . Intimate partner violence:    Fear of current or ex partner: Not on file    Emotionally abused: Not on file    Physically abused: Not on file    Forced sexual activity: Not on file  Other Topics Concern  . Not on file  Social History Narrative  . Not on file    Family History  Problem Relation Age of Onset  . Heart attack Father 13       MI x 3     No  current facility-administered medications for this visit.  No current outpatient medications on file.  Facility-Administered Medications Ordered in Other Visits:  .  0.9 %  sodium chloride infusion, , Intravenous, Continuous, Pyreddy, Pavan, MD, Last Rate: 100 mL/hr at 08/15/17 0359 .  acetaminophen (TYLENOL) tablet 650 mg, 650 mg, Oral, Q6H PRN **OR** acetaminophen (TYLENOL) suppository 650 mg, 650 mg, Rectal, Q6H PRN, Pyreddy, Pavan, MD .  ARIPiprazole (ABILIFY) tablet 5 mg, 5 mg, Oral, Daily, Pyreddy, Pavan, MD, 5 mg at 08/15/17 0858 .  benzonatate (TESSALON) capsule 100 mg, 100 mg, Oral, Q8H PRN, Pyreddy, Pavan, MD .  ceFEPIme (MAXIPIME) 2 g in sodium chloride 0.9 % 100 mL IVPB, 2 g, Intravenous, Q24H, Pyreddy, Pavan, MD .  DULoxetine (CYMBALTA) DR capsule 60 mg, 60 mg, Oral, Daily, Pyreddy, Pavan, MD, 60 mg at 08/15/17 0858 .  heparin injection 5,000 Units, 5,000 Units, Subcutaneous, Q8H, Pyreddy, Pavan, MD, 5,000 Units at 08/15/17 0600 .  heparin lock flush 100 unit/mL, 500 Units, Intracatheter, PRN, Grayland Ormond, Kathlene November, MD .  ipratropium-albuterol (DUONEB) 0.5-2.5 (3) MG/3ML nebulizer solution 3 mL, 3 mL, Nebulization, Q4H PRN, Pyreddy, Pavan, MD .  LORazepam (ATIVAN) tablet 0.5 mg, 0.5 mg, Oral, PRN, Pyreddy, Pavan, MD .   metoCLOPramide (REGLAN) tablet 10 mg, 10 mg, Oral, QID, Pyreddy, Pavan, MD, 10 mg at 08/15/17 0858 .  mometasone-formoterol (DULERA) 200-5 MCG/ACT inhaler 2 puff, 2 puff, Inhalation, BID, Pyreddy, Pavan, MD, 2 puff at 08/15/17 0858 .  morphine (MS CONTIN) 12 hr tablet 15 mg, 15 mg, Oral, Q12H, Pyreddy, Pavan, MD, 15 mg at 08/15/17 0900 .  morphine 4 MG/ML injection 4 mg, 4 mg, Intravenous, Q3H PRN, Merlyn Lot, MD, 4 mg at 08/14/17 2327 .  ondansetron (ZOFRAN) injection 4 mg, 4 mg, Intravenous, Once, Merlyn Lot, MD .  pantoprazole (PROTONIX) EC tablet 40 mg, 40 mg, Oral, Daily, Pyreddy, Pavan, MD, 40 mg at 08/15/17 0858 .  prochlorperazine (COMPAZINE) tablet 10 mg, 10 mg, Oral, Q6H PRN, Pyreddy, Pavan, MD .  promethazine (PHENERGAN) tablet 25 mg, 25 mg, Oral, Q8H PRN, Pyreddy, Pavan, MD .  senna-docusate (Senokot-S) tablet 1 tablet, 1 tablet, Oral, QHS PRN, Pyreddy, Pavan, MD .  sodium chloride flush (NS) 0.9 % injection 10 mL, 10 mL, Intravenous, PRN, Lloyd Huger, MD, 10 mL at 03/01/17 0841  Physical exam:  Vitals:   08/14/17 1040 08/14/17 1108  BP: (!) (P) 78/52 (!) 72/58  Pulse: (P) 88 88  Resp:  (!) 24  Temp: (!) (P) 96.7 F (35.9 C) (!) 96.7 F (35.9 C)  TempSrc: (P) Tympanic Tympanic  SpO2:  (!) 84%   Physical Exam  Constitutional: She is oriented to person, place, and time. Vital signs are normal. She appears well-developed and well-nourished.  HENT:  Head: Normocephalic and atraumatic.  Eyes: Pupils are equal, round, and reactive to light.  Neck: Normal range of motion.  Cardiovascular: Normal rate, regular rhythm and normal heart sounds.  No murmur heard. Pulmonary/Chest: Effort normal and breath sounds normal. She has no wheezes.  Abdominal: Soft. Normal appearance and bowel sounds are normal. She exhibits no distension. There is no tenderness.  Musculoskeletal: Normal range of motion.       Lumbar back: She exhibits tenderness, swelling and edema.    Neurological: She is alert and oriented to person, place, and time.  Skin: Skin is warm and dry. No rash noted. There is pallor.  Psychiatric: Judgment normal.     CMP Latest Ref Rng & Units 08/15/2017  Glucose  70 - 99 mg/dL 91  BUN 8 - 23 mg/dL 49(H)  Creatinine 0.44 - 1.00 mg/dL 1.94(H)  Sodium 135 - 145 mmol/L 136  Potassium 3.5 - 5.1 mmol/L 3.9  Chloride 98 - 111 mmol/L 111  CO2 22 - 32 mmol/L 16(L)  Calcium 8.9 - 10.3 mg/dL 6.8(L)  Total Protein 6.5 - 8.1 g/dL -  Total Bilirubin 0.3 - 1.2 mg/dL -  Alkaline Phos 38 - 126 U/L -  AST 15 - 41 U/L -  ALT 0 - 44 U/L -   CBC Latest Ref Rng & Units 08/15/2017  WBC 3.6 - 11.0 K/uL 8.1  Hemoglobin 12.0 - 16.0 g/dL 8.8(L)  Hematocrit 35.0 - 47.0 % 26.2(L)  Platelets 150 - 440 K/uL 164    No images are attached to the encounter.  Ct Abdomen Pelvis Wo Contrast  Result Date: 08/14/2017 CLINICAL DATA:  67 year old female with right flank pain, acute renal failure since last week. Altered mental status. History of extensive stage small cell lung cancer. Currently on immunotherapy. EXAM: CT ABDOMEN AND PELVIS WITHOUT CONTRAST TECHNIQUE: Multidetector CT imaging of the abdomen and pelvis was performed following the standard protocol without IV contrast. COMPARISON:  Restaging CT chest, abdomen and Pelvis 06/14/2017 and earlier. FINDINGS: Lower chest: Visible lung bases are stable and negative. No pericardial or pleural effusion. Hepatobiliary: Hypodense liver metastases are poorly visible in the absence of IV contrast. Stable liver size and contour. The gallbladder is distended today, but there is no pericholecystic inflammation. CBD size is stable. Pancreas: Stable fatty atrophy. Spleen: Stable, negative. Adrenals/Urinary Tract: Stable and normal adrenal glands. No hydronephrosis. No perinephric stranding. Stable left renal artery calcified atherosclerosis. No hydroureter. Small left greater than right retroperitoneal lymph nodes appear stable along  the course of the ureters. Mildly distended but otherwise unremarkable noncontrast urinary bladder. No perivesical stranding is evident today. Stomach/Bowel: Decompressed distal colon. Mild to moderate diverticulosis and the descending and sigmoid colon without active inflammation. Gas and stool in the transverse colon. Negative right colon and terminal ileum. Diminutive or absent appendix. No dilated small bowel. Decompressed stomach. No abdominal free air, free fluid. Vascular/Lymphatic: Extensive Aortoiliac calcified atherosclerosis. Vascular patency is not evaluated in the absence of IV contrast. Stable small lymph nodes in the abdomen and pelvis. Reproductive: Negative noncontrast appearance. Other: No pelvic free fluid. Musculoskeletal: Scattered sclerotic bone metastases appears stable since May including in the L1, L2 and L5 vertebral bodies. Chronic L4-L5 spondylolysis and spondylolisthesis with degenerative spinal stenosis. Stable femoral head sclerosis which might be degenerative or AVN related. Stable sclerotic right proximal femur possible intertrochanteric metastasis. No new osseous abnormality. IMPRESSION: 1. No acute or inflammatory process is evident in the noncontrast abdomen or pelvis. No renal obstruction. 2. Osseous metastatic disease appears stable since 06/14/2017. Liver metastases are poorly visible without contrast. 3.  Aortic Atherosclerosis (ICD10-I70.0). Electronically Signed   By: [name] M.D.   On: 08/14/2017 15:27   Dg Chest Port 1 View  Result Date: 08/14/2017 CLINICAL DATA:  Hypotension and hypoxia EXAM: PORTABLE CHEST 1 VIEW COMPARISON:  03/22/2017 FINDINGS: Cardiac shadow is enlarged but stable. Right chest wall port is noted and stable. The patchy nodular density seen on the prior exam have significantly improved when compare with the prior study. Some persistent scarring in the left upper lobe is noted. No sizable effusion is noted. No bony abnormality is seen. IMPRESSION:  Significant improvement in nodular changes when compare with the prior exam. Some mild scarring is noted again in  the left upper lobe. Electronically Signed   By: [name] M.D.   On: 08/14/2017 13:12     Assessment and plan- Patient is a 67 y.o. female who presents with altered mental status, dehydration and weakness.  1.  Lung cancer: s/p 3 cycles of Tecentriq.  Tolerated well.  Scheduled to return to clinic to see Dr. Grayland Ormond and cycle 4 of Tecentriq on 08/23/2017.  Most recent CT abdomen/pelvis on 06/14/2017 revealed interval positive treatment response.  Maintenance Tecentriq was recommended.  2.  Confusion/altered mental status: On initial assessment, patient is  alert and oriented with minimal confusion.  Daughter states she noticed the confusion yesterday.  She also noticed that she has not urinated in 24. Patient cannot remember when she last urinated.  Complains of 6 out of 10 bilateral flank pain worse on the right side.  Slight edema noted during assessment.  Patient is weak.  She is hypotensive.  Unable to  collect a UA or urine culture.  Collect stat labs: Patient found to be in acute renal failure with a creatinine of 4.02 and BUN of 58.  Slight leukocytosis with a white count of 12.9 and elevated ANC.  All  1 L NaCl while waiting on labs.  Patient was taken directly to the emergency room, give her hypotension and counts.  Report called ahead primary medical oncologist Dr. Grayland Ormond and notified.   Visit Diagnosis 1. Dehydration   2. Small cell lung cancer ([place])   3. Acute confusional state     Patient expressed understanding and was in agreement with this plan. She also understands that She can call clinic at any time with any questions, concerns, or complaints.   Greater than 50% was spent in counseling and coordination of care with this patient including but not limited to discussion of the relevant topics above (See A&P) including, but not limited to diagnosis and  management of acute and chronic medical conditions.    Faythe Casa, AGNP-C [facility] [place]- [phone] Pager- [phone] 08/15/2017 10:39 AM

## 2017-08-14 NOTE — Progress Notes (Signed)
CODE SEPSIS - PHARMACY COMMUNICATION  **Broad Spectrum Antibiotics should be administered within 1 hour of Sepsis diagnosis**  Time Code Sepsis Called/Page Received: 1250  Antibiotics Ordered: vancomycin and cefepime  Time of 1st antibiotic administration: 1315  Additional action taken by pharmacy:   If necessary, Name of Provider/Nurse Contacted:     Napoleon Form ,PharmD Clinical Pharmacist  08/14/2017  12:58 PM

## 2017-08-14 NOTE — ED Notes (Signed)
Patient states "I'm confused". A&O x4. Family reports episodes of aphagia. Patient has been forgetful.

## 2017-08-14 NOTE — ED Notes (Addendum)
Pt presents from cancer center for dehydration, confusion, acute renal failure and hypoxia (brought by cancer center staff) - they report she is not neutropenic

## 2017-08-14 NOTE — Telephone Encounter (Signed)
Daughter called and reports that patient has gotten worse over the weekend and has N/V and confusion. Feels she is dehydrated also. Denies fever. States same thing happened a couple of months ago and her electrolytes were off.  Appointment accepted for 1030 ofr lab/ NP/IVF

## 2017-08-14 NOTE — Progress Notes (Signed)
Advanced care plan. Purpose of the Encounter: CODE STATUS Parties in Attendance:Patient  Patient's Decision Capacity: Good Subjective/Patient's story: Presented to ER with low blood pressure, confusion Objective/Medical story Patient was referred from oncology clinic for low blood pressure and confusion She is dehydrated Goals of care determination:  Advance care directives and goals of care discussed Patient wants everything done for now which includes cpr and intubation and if need arises CODE STATUS: Full code Time spent discussing advanced care planning: 16 minutes

## 2017-08-15 LAB — URINE CULTURE: Culture: NO GROWTH

## 2017-08-15 LAB — BASIC METABOLIC PANEL
ANION GAP: 9 (ref 5–15)
BUN: 49 mg/dL — ABNORMAL HIGH (ref 8–23)
CO2: 16 mmol/L — AB (ref 22–32)
Calcium: 6.8 mg/dL — ABNORMAL LOW (ref 8.9–10.3)
Chloride: 111 mmol/L (ref 98–111)
Creatinine, Ser: 1.94 mg/dL — ABNORMAL HIGH (ref 0.44–1.00)
GFR calc non Af Amer: 26 mL/min — ABNORMAL LOW (ref >60)
GFR, EST AFRICAN AMERICAN: 30 mL/min — AB (ref >60)
GLUCOSE: 91 mg/dL (ref 70–99)
Potassium: 3.9 mmol/L (ref 3.5–5.1)
Sodium: 136 mmol/L (ref 135–145)

## 2017-08-15 LAB — CBC
HEMATOCRIT: 26.2 % — AB (ref 35.0–47.0)
HEMOGLOBIN: 8.8 g/dL — AB (ref 12.0–16.0)
MCH: 31.6 pg (ref 26.0–34.0)
MCHC: 33.4 g/dL (ref 32.0–36.0)
MCV: 94.8 fL (ref 80.0–100.0)
Platelets: 164 10*3/uL (ref 150–440)
RBC: 2.77 MIL/uL — AB (ref 3.80–5.20)
RDW: 15.1 % — ABNORMAL HIGH (ref 11.5–14.5)
WBC: 8.1 10*3/uL (ref 3.6–11.0)

## 2017-08-15 LAB — CORTISOL: CORTISOL PLASMA: 3.9 ug/dL

## 2017-08-15 MED ORDER — CYCLOBENZAPRINE HCL 10 MG PO TABS
5.0000 mg | ORAL_TABLET | Freq: Three times a day (TID) | ORAL | Status: DC
  Administered 2017-08-15 – 2017-08-16 (×3): 5 mg via ORAL
  Filled 2017-08-15 (×4): qty 1

## 2017-08-15 MED ORDER — CEFDINIR 300 MG PO CAPS
300.0000 mg | ORAL_CAPSULE | Freq: Two times a day (BID) | ORAL | Status: DC
  Administered 2017-08-15 – 2017-08-16 (×2): 300 mg via ORAL
  Filled 2017-08-15 (×4): qty 1

## 2017-08-15 MED ORDER — ENSURE ENLIVE PO LIQD
237.0000 mL | Freq: Three times a day (TID) | ORAL | Status: DC
  Administered 2017-08-15 – 2017-08-16 (×3): 237 mL via ORAL

## 2017-08-15 MED ORDER — MEGESTROL ACETATE 400 MG/10ML PO SUSP
400.0000 mg | Freq: Every day | ORAL | Status: DC
  Administered 2017-08-15 – 2017-08-16 (×2): 400 mg via ORAL
  Filled 2017-08-15 (×2): qty 10

## 2017-08-15 MED ORDER — BOOST / RESOURCE BREEZE PO LIQD CUSTOM
1.0000 | Freq: Three times a day (TID) | ORAL | Status: DC
  Administered 2017-08-15: 1 via ORAL

## 2017-08-15 NOTE — Progress Notes (Signed)
Initial Nutrition Assessment  DOCUMENTATION CODES:   Obesity unspecified  INTERVENTION:  Recommend liberalizing diet to regular.  Provide Ensure Enlive po TID, each supplement provides 350 kcal and 20 grams of protein. Patient prefers vanilla.  Reviewed strategies for managing nausea. Encouraged small, frequent meals throughout the day. Discussed having easy-to-digest food or beverages on hand to help prevent worsening of nausea.  NUTRITION DIAGNOSIS:   Increased nutrient needs related to catabolic illness, cancer and cancer related treatments as evidenced by estimated needs.  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   Met with patient at bedside. She reports that initially in February she began having N/V that was affecting her appetite. It eventually got better and her appetite returned to normal. Now for the past 2 weeks she has had more N/V and her appetite is poor again. She is only eating one meal per day now. For that meal she tries to eat a protein with a carbohydrate and also has some fruit juice. She reports the MD is starting her on an appetite stimulant today to see if that will help. Patient also enjoys vanilla Ensure and would like to drink them here.  UBW 220 lbs. Patient originally reported she has lost 30 lbs. However, RD obtained bed scale weight of 227.3 lbs.  Meal Completion: 100% of breakfast this morning (only one slice of [place] toast with margarine and syrup and 2 glasses of orange juice; 510 kcal and 10 grams of  protein)  Medications reviewed and include: Megace 400 mg daily, Reglan 10 mg QID, pantoprazole, NS @ 100 mL/hr.  Labs reviewed: CO2 16, BUN 49, Creatinine 1.94.  Patient does not meet criteria for malnutrition at this time, but will be at risk for development of malnutrition if decreased appetite and inadequate intake continue.  Discussed with RN.  NUTRITION - FOCUSED PHYSICAL EXAM:    Most Recent Value  Orbital Region  No depletion  Upper Arm Region  No depletion  Thoracic and Lumbar Region  No depletion  Buccal Region  No depletion  Temple Region  No depletion  Clavicle Bone Region  No depletion  Clavicle and Acromion Bone Region  No depletion  Scapular Bone Region  No depletion  Dorsal Hand  No depletion  Patellar Region  No depletion  Anterior Thigh Region  No depletion  Posterior Calf Region  No depletion  Edema (RD Assessment)  None  Hair  Reviewed  Eyes  Reviewed  Mouth  Reviewed  Skin  Reviewed  Nails  Reviewed     Diet Order:   Diet Order           Diet Heart Room service appropriate? Yes; Fluid consistency: Thin  Diet effective now          EDUCATION NEEDS:   Education needs have been addressed  Skin:  Skin Assessment: Reviewed RN Assessment  Last BM:  PTA (08/12/2017 per chart)  Height:   Ht Readings from Last 1 Encounters:  08/14/17 _0  (1.651 m)    Weight:   Wt Readings  from Last 1 Encounters:  08/15/17 227 lb 4.8 oz (103.1 kg)    Ideal Body Weight:  56.8 kg  BMI:  Body mass index is 37.82 kg/m.  Estimated Nutritional Needs:   Kcal:  2050-2360 (MSJ x 1.3-1.5)  Protein:  100-120 grams (1-1.2 grams/kg)  Fluid:  2-2.3 L/day (1 mL/kcal)  Willey Blade, MS, RD, LDN Office: [phone] Pager: [phone] After Hours/Weekend Pager: [phone]

## 2017-08-15 NOTE — Progress Notes (Signed)
[place] at [facility]                                                                                                                                                                                  Patient Demographics   Grace [name], is a 67 y.o. female, DOB - 1950/08/17, PZW:[id number]  Admit date - 08/14/2017   Admitting Physician [name], MD  Outpatient Primary MD for the patient is [name], MD   LOS - 1  Subjective: Patient admitted with hypotension and acute renal failure She is doing better feeling more stronger    Review of Systems:   CONSTITUTIONAL: No documented fever.  Positive fatigue, positive weakness. No weight gain, no weight loss.  EYES: No blurry or double vision.  ENT: No tinnitus. No postnasal drip. No redness of the oropharynx.  RESPIRATORY: No cough, no wheeze, no hemoptysis. No dyspnea.  CARDIOVASCULAR: No chest pain. No orthopnea. No palpitations. No syncope.  GASTROINTESTINAL: No nausea, no vomiting or diarrhea. No abdominal pain. No melena or hematochezia.  GENITOURINARY: No dysuria or hematuria.  ENDOCRINE: No polyuria or nocturia. No heat or cold intolerance.  HEMATOLOGY: No anemia. No bruising. No bleeding.  INTEGUMENTARY: No rashes. No lesions.  MUSCULOSKELETAL: No arthritis. No swelling. No gout.  NEUROLOGIC: No numbness, tingling, or ataxia. No seizure-type activity.  PSYCHIATRIC: No anxiety. No insomnia. No ADD.    Vitals:   Vitals:   08/14/17 2107 08/15/17 0558 08/15/17 0854 08/15/17 1317  BP: (!) 126/95 (!) 96/57 (!) 92/54   Pulse:  (!) 104 90   Resp:  18 18   Temp:  97.6 F (36.4 C) 98.2 F (36.8 C)   TempSrc:  Oral Oral   SpO2:  97%    Weight:    103.1 kg (227 lb 4.8 oz)  Height:        Wt Readings from Last 3 Encounters:  08/15/17 103.1 kg (227 lb 4.8 oz)  08/03/17 91.6 kg (202 lb)  07/31/17 91.6 kg (202 lb)     Intake/Output Summary (Last 24 hours) at 08/15/2017 1536 Last data  filed at 08/15/2017 1500 Gross per 24 hour  Intake 1157.67 ml  Output 2075 ml  Net -917.33 ml    Physical Exam:   GENERAL: Pleasant-appearing in no apparent distress.  HEAD, EYES, EARS, NOSE AND THROAT: Atraumatic, normocephalic. Extraocular muscles are intact. Pupils equal and reactive to light. Sclerae anicteric. No conjunctival injection. No oro-pharyngeal erythema.  NECK: Supple. There is no jugular venous distention. No bruits, no lymphadenopathy, no thyromegaly.  HEART: Regular rate and rhythm,. No murmurs, no rubs, no clicks.  LUNGS: Clear to auscultation bilaterally. No rales or  rhonchi. No wheezes.  ABDOMEN: Soft, flat, nontender, nondistended. Has good bowel sounds. No hepatosplenomegaly appreciated.  EXTREMITIES: No evidence of any cyanosis, clubbing, or peripheral edema.  +2 pedal and radial pulses bilaterally.  NEUROLOGIC: The patient is alert, awake, and oriented x3 with no focal motor or sensory deficits appreciated bilaterally.  SKIN: Moist and warm with no rashes appreciated.  Psych: Not anxious, depressed LN: No inguinal LN enlargement    Antibiotics   Anti-infectives (From admission, onward)   Start     Dose/Rate Route Frequency Ordered Stop   08/15/17 1200  ceFEPIme (MAXIPIME) 2 g in sodium chloride 0.9 % 100 mL IVPB  Status:  Discontinued     2 g 200 mL/hr over 30 Minutes Intravenous Every 24 hours 08/14/17 1703 08/15/17 1117   08/15/17 1200  cefdinir (OMNICEF) capsule 300 mg     300 mg Oral Every 12 hours 08/15/17 1117     08/14/17 1300  ceFEPIme (MAXIPIME) 2 g in sodium chloride 0.9 % 100 mL IVPB     2 g 200 mL/hr over 30 Minutes Intravenous  Once 08/14/17 1250 08/14/17 1345   08/14/17 1300  vancomycin (VANCOCIN) IVPB 1000 mg/200 mL premix     1,000 mg 200 mL/hr over 60 Minutes Intravenous  Once 08/14/17 1250 08/14/17 1445      Medications   Scheduled Meds: . ARIPiprazole  5 mg Oral Daily  . cefdinir  300 mg Oral Q12H  . cyclobenzaprine  5 mg Oral TID   . DULoxetine  60 mg Oral Daily  . feeding supplement (ENSURE ENLIVE)  237 mL Oral TID BM  . heparin  5,000 Units Subcutaneous Q8H  . megestrol  400 mg Oral Daily  . metoCLOPramide  10 mg Oral QID  . mometasone-formoterol  2 puff Inhalation BID  . morphine  15 mg Oral Q12H  . ondansetron (ZOFRAN) IV  4 mg Intravenous Once  . pantoprazole  40 mg Oral Daily   Continuous Infusions: . sodium chloride 100 mL/hr at 08/15/17 1459   PRN Meds:.acetaminophen **OR** acetaminophen, benzonatate, ipratropium-albuterol, LORazepam, morphine injection, prochlorperazine, promethazine, senna-docusate   Data Review:   Micro Results Recent Results (from the past 240 hour(s))  Urine culture     Status: None   Collection Time: 08/14/17  1:10 PM  Result Value Ref Range Status   Specimen Description   Final    URINE, CATHETERIZED Performed at [facility], [address], [place], [place] [address]    Special Requests   Final    NONE Performed at [facility], [address] [place], [place] [address]    Culture   Final    NO GROWTH Performed at [facility], [place] [address], [place], [place] [address]    Report Status 08/15/2017 FINAL  Final  Blood Culture (routine x 2)     Status: None (Preliminary result)   Collection Time: 08/14/17  1:12 PM  Result Value Ref Range Status   Specimen Description BLOOD PORT  Final   Special Requests   Final    BOTTLES DRAWN AEROBIC AND ANAEROBIC Blood Culture results may not be optimal due to an excessive volume of blood received in culture bottles   Culture   Final    NO GROWTH < 24 HOURS Performed at [facility], [place] [place],  [address]    Report Status PENDING  Incomplete  Blood Culture (routine x 2)     Status: None (Preliminary result)  Collection Time: 08/14/17  1:12 PM  Result Value Ref Range Status   Specimen Description BLOOD LEFT FOREARM  Final   Special Requests   Final    BOTTLES DRAWN  AEROBIC AND ANAEROBIC Blood Culture adequate volume   Culture   Final    NO GROWTH < 24 HOURS Performed at [facility], [address], [place], [place] [address]    Report Status PENDING  Incomplete  MRSA PCR Screening     Status: None   Collection Time: 08/14/17  5:06 PM  Result Value Ref Range Status   MRSA by PCR NEGATIVE NEGATIVE Final    Comment:        The GeneXpert MRSA Assay (FDA approved for NASAL specimens only), is one component of a comprehensive MRSA colonization surveillance program. It is not intended to diagnose MRSA infection nor to guide or monitor treatment for MRSA infections. Performed at [facility], [address], [place], [place] [address]     Radiology Reports Ct Abdomen Pelvis Wo Contrast  Result Date: 08/14/2017 CLINICAL DATA:  67 year old female with right flank pain, acute renal failure since last week. Altered mental status. History of extensive stage small cell lung cancer. Currently on immunotherapy. EXAM: CT ABDOMEN AND PELVIS WITHOUT CONTRAST TECHNIQUE: Multidetector CT imaging of the abdomen and pelvis was performed following the standard protocol without IV contrast. COMPARISON:  Restaging CT chest, abdomen and Pelvis 06/14/2017 and earlier. FINDINGS: Lower chest: Visible lung bases are stable and negative. No pericardial or pleural effusion. Hepatobiliary: Hypodense liver metastases are poorly visible in the absence of IV contrast. Stable liver size and contour. The gallbladder is distended today, but there is no pericholecystic inflammation. CBD size is stable. Pancreas: Stable fatty atrophy. Spleen: Stable, negative. Adrenals/Urinary Tract: Stable and normal adrenal glands. No hydronephrosis. No perinephric stranding. Stable left renal artery calcified atherosclerosis. No hydroureter. Small left greater than right retroperitoneal lymph nodes appear stable along the course of the ureters. Mildly distended but otherwise  unremarkable noncontrast urinary bladder. No perivesical stranding is evident today. Stomach/Bowel: Decompressed distal colon. Mild to moderate diverticulosis and the descending and sigmoid colon without active inflammation. Gas and stool in the transverse colon. Negative right colon and terminal ileum. Diminutive or absent appendix. No dilated small bowel. Decompressed stomach. No abdominal free air, free fluid. Vascular/Lymphatic: Extensive Aortoiliac calcified atherosclerosis. Vascular patency is not evaluated in the absence of IV contrast. Stable small lymph nodes in the abdomen and pelvis. Reproductive: Negative noncontrast appearance. Other: No pelvic free fluid. Musculoskeletal: Scattered sclerotic bone metastases appears stable since May including in the L1, L2 and L5 vertebral bodies. Chronic L4-L5 spondylolysis and spondylolisthesis with degenerative spinal stenosis. Stable femoral head sclerosis which might be degenerative or AVN related. Stable sclerotic right proximal femur possible intertrochanteric metastasis. No new osseous abnormality. IMPRESSION: 1. No acute or inflammatory process is evident in the noncontrast abdomen or pelvis. No renal obstruction. 2. Osseous metastatic disease appears stable since 06/14/2017. Liver metastases are poorly visible without contrast. 3.  Aortic Atherosclerosis (ICD10-I70.0). Electronically Signed   By: [name] M.D.   On: 08/14/2017 15:27   Dg Chest Port 1 View  Result Date: 08/14/2017 CLINICAL DATA:  Hypotension and hypoxia EXAM: PORTABLE CHEST 1 VIEW COMPARISON:  03/22/2017 FINDINGS: Cardiac shadow is enlarged but stable. Right chest wall port is noted and stable. The patchy nodular density seen on the prior exam have significantly improved when compare with the prior study. Some persistent scarring in the left upper  lobe is noted. No sizable effusion is noted. No bony abnormality is seen. IMPRESSION: Significant improvement in nodular changes when compare with  the prior exam. Some mild scarring is noted again in the left upper lobe. Electronically Signed   By: [name] M.D.   On: 08/14/2017 13:12     CBC Recent Labs  Lab 08/14/17 1108 08/14/17 1312 08/15/17 0543  WBC 12.9* 13.1* 8.1  HGB 9.9* 9.6* 8.8*  HCT 30.1* 28.9* 26.2*  PLT 205 188 164  MCV 94.0 94.1 94.8  MCH 30.9 31.2 31.6  MCHC 32.9 33.1 33.4  RDW 15.3* 15.2* 15.1*  LYMPHSABS 1.2 1.3  --   MONOABS 0.5 0.6  --   EOSABS 0.8* 0.7  --   BASOSABS 0.1 0.0  --     Chemistries  Recent Labs  Lab 08/14/17 1108 08/14/17 1312 08/15/17 0543  NA 132* 132* 136  K 4.0 4.1 3.9  CL 104 106 111  CO2 16* 17* 16*  GLUCOSE 125* 114* 91  BUN 58* 55* 49*  CREATININE 4.02* 3.36* 1.94*  CALCIUM 7.6* 7.2* 6.8*  MG 1.2*  --   --   AST 21 23  --   ALT 17 18  --   ALKPHOS 55 51  --   BILITOT 0.5 0.4  --    ------------------------------------------------------------------------------------------------------------------ estimated creatinine clearance is 34 mL/min (A) (by C-G formula based on SCr of 1.94 mg/dL (H)). ------------------------------------------------------------------------------------------------------------------ No results for input(s): HGBA1C in the last 72 hours. ------------------------------------------------------------------------------------------------------------------ No results for input(s): CHOL, HDL, LDLCALC, TRIG, CHOLHDL, LDLDIRECT in the last 72 hours. ------------------------------------------------------------------------------------------------------------------ No results for input(s): TSH, T4TOTAL, T3FREE, THYROIDAB in the last 72 hours.  Invalid input(s): FREET3 ------------------------------------------------------------------------------------------------------------------ No results for input(s): VITAMINB12, FOLATE, FERRITIN, TIBC, IRON, RETICCTPCT in the last 72 hours.  Coagulation profile No results for input(s): INR, PROTIME in the last 168  hours.  No results for input(s): DDIMER in the last 72 hours.  Cardiac Enzymes Recent Labs  Lab 08/14/17 1312  TROPONINI <0.03   ------------------------------------------------------------------------------------------------------------------ Invalid input(s): POCBNP    Assessment & Plan   66 year old female patient with history of metastatic lung cancer, congestive heart failure, COPD, hyperlipidemia, hypertension presented to the emergency room for low blood pressure and confusion  -Sepsis  Ruled out, lactic acid normal WBC count normal, urinalysis chest x-ray normal blood cultures negative to date Blood pressure was low likely due to dehydration and entresto Place patient on oral Omnicef she describes history of mastitis  -Acute kidney injury Improved with IV hydration continue IV fluids monitor renal function  -Acute metabolic encephalopathy Now resolved  -Metastatic lung cancer Supportive care Add Megace to current regimen  -COPD continue home inhalers   -History of systolic dysfunction with cardiomyopathy hold metoprolol and Entresto  -Depression anxiety continue Abilify and Cymbalta and Ativan    Code Status Orders  (From admission, onward)        Start     Ordered   08/14/17 1807  Full code  Continuous     08/14/17 1807    Code Status History    Date Active Date Inactive Code Status Order ID Comments User Context   04/04/2017 1558 04/09/2017 1805 Full Code [id number]  Epifanio Lesches, MD Inpatient   08/01/2016 1057 08/01/2016 2250 Full Code [id number]  Earnie Larsson, MD Inpatient   08/22/2014 1628 08/24/2014 1458 Full Code [id number]  Epifanio Lesches, MD ED   07/15/2014 1540 07/17/2014 1921 Full Code [id number]  Earnie Larsson, MD Inpatient   06/30/2014 [phone]  07/02/2014 1855 Full Code [id number]  Lance Coon, MD ED    Advance Directive Documentation     Most Recent Value  Type of Advance Directive  Living will  Pre-existing out of facility DNR order  (yellow form or pink MOST form)  -  "MOST" Form in Place?  -           Consults none  DVT Prophylaxis heparin  Lab Results  Component Value Date   PLT 164 08/15/2017     Time Spent in minutes  46min Greater than 50% of time spent in care coordination and counseling patient regarding the condition and plan of care.   Dustin Flock M.D on 08/15/2017 at 3:36 PM  Between 7am to 6pm - Pager - [phone]  After 6pm go to [email or web address] - Proofreader  [employer]   Office  [phone]

## 2017-08-16 ENCOUNTER — Ambulatory Visit: Payer: Medicare Other

## 2017-08-16 ENCOUNTER — Ambulatory Visit: Payer: Medicare Other | Admitting: Oncology

## 2017-08-16 ENCOUNTER — Other Ambulatory Visit: Payer: Medicare Other

## 2017-08-16 LAB — BASIC METABOLIC PANEL
ANION GAP: 3 — AB (ref 5–15)
BUN: 26 mg/dL — ABNORMAL HIGH (ref 8–23)
CALCIUM: 7.1 mg/dL — AB (ref 8.9–10.3)
CHLORIDE: 121 mmol/L — AB (ref 98–111)
CO2: 18 mmol/L — AB (ref 22–32)
Creatinine, Ser: 1.02 mg/dL — ABNORMAL HIGH (ref 0.44–1.00)
GFR calc non Af Amer: 56 mL/min — ABNORMAL LOW (ref >60)
Glucose, Bld: 138 mg/dL — ABNORMAL HIGH (ref 70–99)
Potassium: 3.4 mmol/L — ABNORMAL LOW (ref 3.5–5.1)
SODIUM: 142 mmol/L (ref 135–145)

## 2017-08-16 LAB — MAGNESIUM: MAGNESIUM: 1.3 mg/dL — AB (ref 1.7–2.4)

## 2017-08-16 LAB — HIV ANTIBODY (ROUTINE TESTING W REFLEX): HIV SCREEN 4TH GENERATION: NONREACTIVE

## 2017-08-16 MED ORDER — HEPARIN SOD (PORK) LOCK FLUSH 100 UNIT/ML IV SOLN
500.0000 [IU] | Freq: Once | INTRAVENOUS | Status: AC
  Administered 2017-08-16: 500 [IU] via INTRAVENOUS
  Filled 2017-08-16: qty 5

## 2017-08-16 MED ORDER — CEFDINIR 300 MG PO CAPS: 300.0000 mg | ORAL_CAPSULE | Freq: Two times a day (BID) | ORAL | 0 refills | Status: AC

## 2017-08-16 MED ORDER — MAGNESIUM OXIDE 400 (241.3 MG) MG PO TABS
800.0000 mg | ORAL_TABLET | Freq: Once | ORAL | Status: AC
  Administered 2017-08-16: 800 mg via ORAL
  Filled 2017-08-16: qty 2

## 2017-08-16 MED ORDER — POTASSIUM CHLORIDE CRYS ER 20 MEQ PO TBCR
40.0000 meq | EXTENDED_RELEASE_TABLET | Freq: Once | ORAL | Status: AC
  Administered 2017-08-16: 10:00:00 40 meq via ORAL
  Filled 2017-08-16: qty 2

## 2017-08-16 MED ORDER — MAGNESIUM OXIDE 400 MG PO TABS: 400.0000 mg | ORAL_TABLET | Freq: Two times a day (BID) | ORAL | 0 refills | Status: DC

## 2017-08-16 NOTE — Care Management (Signed)
Discharge to home per Dr.Patel. Lives with family. Dr. Kary Kos is listed as primary care physician.  Request transport home per [facility] due to shortness of breathe with exertion. History of lung cancer. Oxygen challenge test will performed prior to discharge. Discussed Home Health and physical therapy in the home. Declining home health services at time. Shelbie Ammons RN MSN CCM Care Management [phone]

## 2017-08-16 NOTE — Progress Notes (Signed)
SATURATION QUALIFICATIONS: (This note is used to comply with regulatory documentation for home oxygen)  Patient Saturations on Room Air at Rest = 96%  Patient Saturations on Room Air while standing = 97%  Patient Saturations on  Liters of oxygen while Ambulating = %  Please briefly explain why patient needs home oxygen:  Lung Cancer patient was unable to ambulate and was short of breath while standing even though o2 saturation remained in the 90's

## 2017-08-16 NOTE — Evaluation (Signed)
Physical Therapy Evaluation Patient Details Name: Grace [name] MRN: [id number] DOB: December 14, 1950 Today's Date: 08/16/2017   History of Present Illness  67 y.o. female on chemotherapy for lung cancer with mets to bone and liver presents to the ER very confused but found in clinic to be profoundly hypotensive and weak. PMH of COPD, congestive heart failure, hyperlipidemia, hypertension was referred from oncology clinic.  Clinical Impression  Patient A&Ox*4 with pain 6/10 in R buttock at start of session. Patient repots that she lives with spouse and has family support 24/7, uses a wheelchair for community activities and stand pivot transfer at home for toileting needs. States she has not walked in about 4 months. Patient BP at start of session 93/62 HR 113, at end of session back in chair 128/69 128HR. The patient exhibits fatigue and anxiety with transfers and uses arm supports to complete transfers. The patient would benefit from further skilled PT to address deficits in mobility to maximize independence and safety.    Follow Up Recommendations Supervision/Assistance - 24 hour;Home health PT;Other (comment)(OT consult aide/RN)    Equipment Recommendations  None recommended by PT    Recommendations for Other Services       Precautions / Restrictions Precautions Precautions: Fall Restrictions Weight Bearing Restrictions: No      Mobility  Bed Mobility               General bed mobility comments: Patient up in chair at start of session  Transfers Overall transfer level: Needs assistance   Transfers: Stand Pivot Transfers   Stand pivot transfers: Min guard       General transfer comment: Patient able to stand less than 1 minute due to fatigue/deconditioning. stand pivot tranfers commode <> chair CGA, but needed to use arms of chair/commode to complete transfer  Ambulation/Gait                Stairs            Wheelchair Mobility    Modified Rankin (Stroke  Patients Only)       Balance Overall balance assessment: Needs assistance   Sitting balance-Leahy Scale: Poor     Standing balance support: Bilateral upper extremity supported Standing balance-Leahy Scale: Poor                               Pertinent Vitals/Pain Pain Assessment: 0-10 Pain Score: 6  Pain Location: R buttock Pain Descriptors / Indicators: Spasm;Tightness Pain Intervention(s): Repositioned    Home Living Family/patient expects to be discharged to:: Private residence Living Arrangements: Spouse/significant other Available Help at Discharge: Family Type of Home: House Home Access: Ramped entrance     Home Layout: Two level Home Equipment: Bedside commode;Walker - 4 wheels;Wheelchair - manual;Grab bars - tub/shower;Grab bars - toilet Additional Comments: Patient states family is responsible for cooking, cleaning. States she has not been ambulatory for about 4 months. States she can perform stand pivot transfer on her own and bathes herself (sponge bath)    Prior Function Level of Independence: Needs assistance   Gait / Transfers Assistance Needed: States she has not ambulated in 4 months  ADL's / Homemaking Assistance Needed: family does cooking/cleaning. states someone is always in the house with her        Hand Dominance   Dominant Hand: Right    Extremity/Trunk Assessment   Upper Extremity Assessment Upper Extremity Assessment: Generalized weakness    Lower Extremity Assessment  Lower Extremity Assessment: Generalized weakness;RLE deficits/detail;LLE deficits/detail RLE Deficits / Details: 3+/5 LLE Deficits / Details: 3+/5       Communication   Communication: No difficulties  Cognition Arousal/Alertness: Awake/alert Behavior During Therapy: WFL for tasks assessed/performed                                          General Comments      Exercises     Assessment/Plan    PT Assessment Patient needs  continued PT services  PT Problem List Decreased strength;Cardiopulmonary status limiting activity;Decreased activity tolerance;Decreased balance;Decreased mobility       PT Treatment Interventions Therapeutic exercise;DME instruction;Gait training;Balance training;Stair training;Neuromuscular re-education;Functional mobility training;Therapeutic activities;Patient/family education    PT Goals (Current goals can be found in the Care Plan section)  Acute Rehab PT Goals Patient Stated Goal: Patient is ready to go home PT Goal Formulation: With patient Time For Goal Achievement: 08/30/17 Potential to Achieve Goals: Good    Frequency Min 2X/week   Barriers to discharge        Co-evaluation               AM-PAC PT "6 Clicks" Daily Activity  Outcome Measure Difficulty turning over in bed (including adjusting bedclothes, sheets and blankets)?: A Little Difficulty moving from lying on back to sitting on the side of the bed? : A Little Difficulty sitting down on and standing up from a chair with arms (e.g., wheelchair, bedside commode, etc,.)?: Unable Help needed moving to and from a bed to chair (including a wheelchair)?: A Lot Help needed walking in hospital room?: Total Help needed climbing 3-5 steps with a railing? : Total 6 Click Score: 11    End of Session Equipment Utilized During Treatment: Gait belt Activity Tolerance: Patient limited by fatigue Patient left: in chair;with nursing/sitter in room;with family/visitor present;with call bell/phone within reach Nurse Communication: Mobility status PT Visit Diagnosis: Unsteadiness on feet (R26.81);Difficulty in walking, not elsewhere classified (R26.2);Other abnormalities of gait and mobility (R26.89);Muscle weakness (generalized) (M62.81)    Time: 0134-0200 PT Time Calculation (min) (ACUTE ONLY): 26 min   Charges:   PT Evaluation $PT Eval Low Complexity: 1 Low PT Treatments $Therapeutic Activity: 8-22 mins   PT G  Codes:       Lieutenant Diego PT, DPT 2:14 PM,08/16/17 [phone]

## 2017-08-16 NOTE — Discharge Summary (Addendum)
Grace [name] at [name], 67 y.o., DOB 11-Apr-1950, MRN [id number]. Admission date: 08/14/2017 Discharge Date 08/16/2017 Primary MD Grace Pink, MD Admitting Physician Grace Shelling, MD  Admission Diagnosis  AKI (acute kidney injury) [name]) [N17.9] Hypotension due to hypovolemia [I95.89, E86.1]  Discharge Diagnosis   Active Problems: Hypotension related to dehydration and Entresto sepsis ruled out Acute kidney injury due to dehydration now improved with IV fluid Acute metabolic encephalopathy due to hypo-to Metastatic lung cancer COPD History of systolic dysfunction with cardiomyopathy Depression  Hospital Course   67 year old female patient with history of metastatic lung cancer, congestive heart failure, COPD, hyperlipidemia, hypertension presented to the emergency room for low blood pressure and confusion.  According to patient her blood pressure has been running low for a long time.  She is taking Entresto for cardiomyopathy.  She also had some confusion therefore she was admitted for hypotension patient was given IV fluids with improvement in her blood pressure. Her renal function also improved with IV hydration.  I recommended patient hold her Entresto and metoprolol until seen by her cardiologist.  Patient otherwise denies any chest pain or shortness of breath           Consults  None  Significant Tests:  See full reports for all details    Ct Abdomen Pelvis Wo Contrast  Result Date: 08/14/2017 CLINICAL DATA:  67 year old female with right flank pain, acute renal failure since last week. Altered mental status. History of extensive stage small cell lung cancer. Currently on immunotherapy. EXAM: CT ABDOMEN AND PELVIS WITHOUT CONTRAST TECHNIQUE: Multidetector CT imaging of the abdomen and pelvis was performed following the standard protocol without IV contrast. COMPARISON:  Restaging CT chest, abdomen and Pelvis 06/14/2017 and earlier.  FINDINGS: Lower chest: Visible lung bases are stable and negative. No pericardial or pleural effusion. Hepatobiliary: Hypodense liver metastases are poorly visible in the absence of IV contrast. Stable liver size and contour. The gallbladder is distended today, but there is no pericholecystic inflammation. CBD size is stable. Pancreas: Stable fatty atrophy. Spleen: Stable, negative. Adrenals/Urinary Tract: Stable and normal adrenal glands. No hydronephrosis. No perinephric stranding. Stable left renal artery calcified atherosclerosis. No hydroureter. Small left greater than right retroperitoneal lymph nodes appear stable along the course of the ureters. Mildly distended but otherwise unremarkable noncontrast urinary bladder. No perivesical stranding is evident today. Stomach/Bowel: Decompressed distal colon. Mild to moderate diverticulosis and the descending and sigmoid colon without active inflammation. Gas and stool in the transverse colon. Negative right colon and terminal ileum. Diminutive or absent appendix. No dilated small bowel. Decompressed stomach. No abdominal free air, free fluid. Vascular/Lymphatic: Extensive Aortoiliac calcified atherosclerosis. Vascular patency is not evaluated in the absence of IV contrast. Stable small lymph nodes in the abdomen and pelvis. Reproductive: Negative noncontrast appearance. Other: No pelvic free fluid. Musculoskeletal: Scattered sclerotic bone metastases appears stable since May including in the L1, L2 and L5 vertebral bodies. Chronic L4-L5 spondylolysis and spondylolisthesis with degenerative spinal stenosis. Stable femoral head sclerosis which might be degenerative or AVN related. Stable sclerotic right proximal femur possible intertrochanteric metastasis. No new osseous abnormality. IMPRESSION: 1. No acute or inflammatory process is evident in the noncontrast abdomen or pelvis. No renal obstruction. 2. Osseous metastatic disease appears stable since 06/14/2017. Liver  metastases are poorly visible without contrast. 3.  Aortic Atherosclerosis (ICD10-I70.0). Electronically Signed   By: Grace [name] M.D.   On: 08/14/2017 15:27   Dg Chest [facility]  Result Date: 08/14/2017 CLINICAL DATA:  Hypotension and hypoxia EXAM: PORTABLE CHEST 1 VIEW COMPARISON:  03/22/2017 FINDINGS: Cardiac shadow is enlarged but stable. Right chest wall port is noted and stable. The patchy nodular density seen on the prior exam have significantly improved when compare with the prior study. Some persistent scarring in the left upper lobe is noted. No sizable effusion is noted. No bony abnormality is seen. IMPRESSION: Significant improvement in nodular changes when compare with the prior exam. Some mild scarring is noted again in the left upper lobe. Electronically Signed   By: [name] M.D.   On: 08/14/2017 13:12       Today   Subjective:   Rod Can  Pt states she has some body aches no other  Objective:   Blood pressure 93/62, pulse (!) 112, temperature 97.7 F (36.5 C), temperature source Oral, resp. rate (!) 26, height 5\' 5"  (1.651 m), weight 103.1 kg (227 lb 4.8 oz), SpO2 95 %.  .  Intake/Output Summary (Last 24 hours) at 08/16/2017 1401 Last data filed at 08/16/2017 1032 Gross per 24 hour  Intake 2760 ml  Output 3925 ml  Net -1165 ml    Exam VITAL SIGNS: Blood pressure 93/62, pulse (!) 112, temperature 97.7 F (36.5 C), temperature source Oral, resp. rate (!) 26, height 5\' 5"  (1.651 m), weight 103.1 kg (227 lb 4.8 oz), SpO2 95 %.  GENERAL:  66 y.o.-year-old patient lying in the bed with no acute distress.  EYES: Pupils equal, round, reactive to light and accommodation. No scleral icterus. Extraocular muscles intact.  HEENT: Head atraumatic, normocephalic. Oropharynx and nasopharynx clear.  NECK:  Supple, no jugular venous distention. No thyroid enlargement, no tenderness.  LUNGS: Normal breath sounds bilaterally, no wheezing, rales,rhonchi or crepitation. No use of  accessory muscles of respiration.  CARDIOVASCULAR: S1, S2 normal. No murmurs, rubs, or gallops.  ABDOMEN: Soft, nontender, nondistended. Bowel sounds present. No organomegaly or mass.  EXTREMITIES: No pedal edema, cyanosis, or clubbing.  NEUROLOGIC: Cranial nerves II through XII are intact. Muscle strength 5/5 in all extremities. Sensation intact. Gait not checked.  PSYCHIATRIC: The patient is alert and oriented x 3.  SKIN: No obvious rash, lesion, or ulcer.   Data Review     CBC w Diff:  Lab Results  Component Value Date   WBC 8.1 08/15/2017   HGB 8.8 (L) 08/15/2017   HGB 15.3 02/21/2014   HCT 26.2 (L) 08/15/2017   HCT 45.5 02/21/2014   PLT 164 08/15/2017   PLT 247 02/21/2014   LYMPHOPCT 10 08/14/2017   LYMPHOPCT 24.5 02/21/2014   BANDSPCT 0 02/20/2017   MONOPCT 5 08/14/2017   MONOPCT 6.5 02/21/2014   EOSPCT 6 08/14/2017   EOSPCT 0.6 02/21/2014   BASOPCT 0 08/14/2017   BASOPCT 0.6 02/21/2014   CMP:  Lab Results  Component Value Date   NA 142 08/16/2017   NA 140 10/11/2016   NA 138 02/21/2014   K 3.4 (L) 08/16/2017   K 3.7 02/21/2014   CL 121 (H) 08/16/2017   CL 106 02/21/2014   CO2 18 (L) 08/16/2017   CO2 21 02/21/2014   BUN 26 (H) 08/16/2017   BUN 10 10/11/2016   BUN 11 02/21/2014   CREATININE 1.02 (H) 08/16/2017   CREATININE 0.89 02/21/2014   PROT 7.0 08/14/2017   PROT 7.8 02/21/2014   ALBUMIN 3.3 (L) 08/14/2017   ALBUMIN 4.0 02/21/2014   BILITOT 0.4 08/14/2017   BILITOT 0.4 02/21/2014   ALKPHOS 51 08/14/2017  ALKPHOS 110 02/21/2014   AST 23 08/14/2017   AST 29 02/21/2014   ALT 18 08/14/2017   ALT 38 02/21/2014  .  Micro Results Recent Results (from the past 240 hour(s))  Urine culture     Status: None   Collection Time: 08/14/17  1:10 PM  Result Value Ref Range Status   Specimen Description   Final    URINE, CATHETERIZED Performed at [facility], [name], [place], [place] [address]    Special Requests   Final     NONE Performed at [name], [name] [place], [place] [address]    Culture   Final    NO GROWTH Performed at [facility], [place] [address] [place], [place] [address]    Report Status 08/15/2017 FINAL  Final  Blood Culture (routine x 2)     Status: None (Preliminary result)   Collection Time: 08/14/17  1:12 PM  Result Value Ref Range Status   Specimen Description BLOOD PORT  Final   Special Requests   Final    BOTTLES DRAWN AEROBIC AND ANAEROBIC Blood Culture results may not be optimal due to an excessive volume of blood received in culture bottles   Culture   Final    NO GROWTH 2 DAYS Performed at [facility], [address] [place], [place] [address]    Report Status PENDING  Incomplete  Blood Culture (routine x 2)     Status: None (Preliminary result)   Collection Time: 08/14/17  1:12 PM  Result Value Ref Range Status   Specimen Description BLOOD LEFT FOREARM  Final   Special Requests   Final    BOTTLES DRAWN AEROBIC AND ANAEROBIC Blood Culture adequate volume   Culture   Final    NO GROWTH 2 DAYS Performed at [facility], [address], [place], [place] [address]    Report Status PENDING  Incomplete  MRSA PCR Screening     Status: None   Collection Time: 08/14/17  5:06 PM  Result Value Ref Range Status   MRSA by PCR NEGATIVE NEGATIVE Final    Comment:        The GeneXpert MRSA Assay (FDA approved for NASAL specimens only), is one component of a comprehensive MRSA colonization surveillance program. It is not intended to diagnose MRSA infection nor to guide or monitor treatment for MRSA infections. Performed at [facility], [address], [place],  [address]         Code Status Orders  (From admission, onward)        Start     Ordered   08/14/17 1807  Full code  Continuous     08/14/17 1807    Code Status History    Date Active Date Inactive Code Status Order ID Comments User Context    04/04/2017 1558 04/09/2017 1805 Full Code [id number]  Epifanio Lesches, MD Inpatient   08/01/2016 1057 08/01/2016 2250 Full Code [id number]  Earnie Larsson, MD Inpatient   08/22/2014 1628 08/24/2014 1458 Full Code [id number]  Epifanio Lesches, MD ED   07/15/2014 1540 07/17/2014 1921 Full Code [id number]  Earnie Larsson, MD Inpatient   06/30/2014 0520 07/02/2014 1855 Full Code [id number]  Lance Coon, MD ED    Advance Directive Documentation     Most Recent Value  Type of Advance Directive  Living will  Pre-existing out of facility DNR order (yellow form or [name] MOST form)  -  "MOST" Form in Place?  -  Follow-up Information    Minna Merritts, MD. Go on 09/08/2017.   Specialty:  Cardiology Why:  @ 9am Contact information: [place] [address] [phone]        Grace Pink, MD Follow up in 6 day(s).   Specialty:  Family Medicine Contact information: [place] [address] [phone]           Discharge Medications   Allergies as of 08/16/2017      Reactions   Fish Allergy Anaphylaxis   Per pt after eating [facility] she had throat swelling and SOB. MSY    Fentanyl    Overly sedated and groggy, ineffective       Medication List    STOP taking these medications   metoprolol succinate 100 MG 24 hr tablet Commonly known as:  TOPROL-XL   sacubitril-valsartan 49-51 MG Commonly known as:  ENTRESTO     TAKE these medications   ADVAIR DISKUS 250-50 MCG/DOSE Aepb Generic drug:  Fluticasone-Salmeterol Inhale 1 puff into the lungs 2 (two) times daily.   alendronate 70 MG tablet Commonly known as:  FOSAMAX Take 70 mg by mouth every Thursday.   ARIPiprazole 5 MG tablet Commonly known as:  ABILIFY Take 5 mg by mouth daily.   benzonatate 100 MG capsule Commonly known as:  TESSALON Take 1 capsule (100 mg total) by mouth every 8 (eight) hours as needed for cough.   cefdinir 300 MG capsule Commonly known as:   OMNICEF Take 1 capsule (300 mg total) by mouth every 12 (twelve) hours for 5 days.   diphenoxylate-atropine 2.5-0.025 MG tablet Commonly known as:  LOMOTIL Take 1 tablet by mouth 4 (four) times daily as needed for diarrhea or loose stools.   DULoxetine 60 MG capsule Commonly known as:  CYMBALTA Take 60 mg by mouth daily.   ipratropium-albuterol 0.5-2.5 (3) MG/3ML Soln Commonly known as:  DUONEB Take 3 mLs by nebulization every 4 (four) hours as needed.   LORazepam 0.5 MG tablet Commonly known as:  ATIVAN Take 0.5 mg by mouth as needed.   magnesium oxide 400 MG tablet Commonly known as:  MAG-OX Take 1 tablet (400 mg total) by mouth 2 (two) times daily.   metoCLOPramide 10 MG tablet Commonly known as:  REGLAN Take 1 tablet (10 mg total) by mouth 4 (four) times daily.   morphine 15 MG 12 hr tablet Commonly known as:  MS CONTIN Take 1 tablet (15 mg total) by mouth every 12 (twelve) hours.   pantoprazole 40 MG tablet Commonly known as:  PROTONIX Take 1 tablet by mouth daily.   potassium chloride SA 20 MEQ tablet Commonly known as:  K-DUR,KLOR-CON Take 1 tablet (20 mEq total) by mouth daily.   PROAIR HFA 108 (90 Base) MCG/ACT inhaler Generic drug:  albuterol Inhale 2 puffs into the lungs every 6 (six) hours as needed for wheezing or shortness of breath.   prochlorperazine 10 MG tablet Commonly known as:  COMPAZINE Take 1 tablet (10 mg total) by mouth every 6 (six) hours as needed (Nausea or vomiting).   promethazine 25 MG tablet Commonly known as:  PHENERGAN Take 1 tablet (25 mg total) by mouth every 8 (eight) hours as needed for nausea or vomiting.          Total Time in preparing paper work, data evaluation and todays exam - 32 minutes  Dustin Flock M.D on 08/16/2017 at 2:01 [employer]  [phone]

## 2017-08-19 LAB — CULTURE, BLOOD (ROUTINE X 2)
CULTURE: NO GROWTH
Culture: NO GROWTH
Special Requests: ADEQUATE

## 2017-08-20 NOTE — Progress Notes (Deleted)
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  INTERVAL HISTORY: Patient returns to clinic today for further evaluation and continuation of maintenance Tecentriq.  She continues to have intermittent nausea and vomiting that appears to be unrelated to her treatments.  She otherwise feels well.  She continues to have chronic pain, and is requesting a long-acting narcotic. She has no neurologic complaints.  She denies any fevers.  She has a fair appetite and denies weight loss.  She has chronic shortness of breath, but denies any chest pain or hemoptysis. She has no abdominal pain.  She denies any melena or hematochezia.  Patient offers no further specific complaints today.  REVIEW OF SYSTEMS:   Review of Systems  Constitutional: Positive for malaise/fatigue. Negative for fever and weight loss.  Respiratory: Positive for shortness of breath. Negative for cough, hemoptysis and wheezing.   Cardiovascular: Negative.  Negative for chest pain and leg swelling.  Gastrointestinal: Positive for nausea and vomiting. Negative for abdominal pain, blood in stool, diarrhea and melena.  Genitourinary: Negative.  Negative for dysuria and frequency.  Musculoskeletal: Positive for back pain, joint pain and neck pain.  Skin: Negative.  Negative for rash.  Neurological: Positive for weakness. Negative for sensory change and focal weakness.  Psychiatric/Behavioral: Negative.  The patient is not nervous/anxious.     As per HPI. Otherwise, a complete review of systems is negative.  PAST MEDICAL HISTORY: Past Medical History:  Diagnosis Date  . Anxiety   . Arthritis   . Asthma    COPD  . Cancer ([place])    Liver  .  CAP (community acquired pneumonia) 06/30/2014  . Cardiomyopathy ([place])    nonischemic (EF 35-40%)  . CHF (congestive heart failure) ([place])   . COPD (chronic obstructive pulmonary disease) ([place])   . Depression   . Dyspnea   . GERD (gastroesophageal reflux disease)   . Hyperlipidemia   . Hypertension   . Pneumonia   . PVC's (premature ventricular contractions)    states 10 years ago  . Sepsis ([place]) 06/30/2014  . SOB (shortness of breath) 05/08/2014  . Spinal cord injury at T7-T12 level [facility])    2016    PAST SURGICAL HISTORY: Past Surgical History:  Procedure Laterality Date  . APPENDECTOMY    . CARDIAC CATHETERIZATION    . CATARACT EXTRACTION     right eye   . CESAREAN SECTION    . COLONOSCOPY    . LAMINOTOMY    . LUMBAR LAMINECTOMY/DECOMPRESSION MICRODISCECTOMY Left 08/01/2016   Procedure: Left Lumbar Four-Five Laminectomy and Foraminotomy;  Surgeon: Earnie Larsson, MD;  Location: [place];  Service: Neurosurgery;  Laterality: Left;  . PORTA CATH INSERTION N/A 02/03/2017   Procedure: PORTA CATH INSERTION;  Surgeon: Katha Cabal, MD;  Location: [place] [facility] LAB;  Service: Cardiovascular;  Laterality: N/A;    FAMILY HISTORY: Family History  Problem Relation Age of Onset  . Heart attack Father 78       MI x 3     ADVANCED DIRECTIVES (Y/N):  N  HEALTH MAINTENANCE: Social History   Tobacco Use  . Smoking status: Former Smoker    Packs/day: 0.50    Years: 50.00    Pack years: 25.00  Types: Cigarettes    Last attempt to quit: 01/27/2015    Years since quitting: 2.5  . Smokeless tobacco: Never Used  Substance Use Topics  . Alcohol use: No  . Drug use: No     Colonoscopy:  PAP:  Bone density:  Lipid panel:  Allergies  Allergen Reactions  . Fish Allergy Anaphylaxis    Per pt after eating [facility] she had throat swelling and SOB. MSY   . Fentanyl     Overly sedated and groggy, ineffective     Current Outpatient Medications  Medication Sig Dispense  Refill  . ADVAIR DISKUS 250-50 MCG/DOSE AEPB Inhale 1 puff into the lungs 2 (two) times daily.    Marland Kitchen albuterol (PROAIR HFA) 108 (90 Base) MCG/ACT inhaler Inhale 2 puffs into the lungs every 6 (six) hours as needed for wheezing or shortness of breath.     Marland Kitchen alendronate (FOSAMAX) 70 MG tablet Take 70 mg by mouth every Thursday.     . ARIPiprazole (ABILIFY) 5 MG tablet Take 5 mg by mouth daily.    . benzonatate (TESSALON) 100 MG capsule Take 1 capsule (100 mg total) by mouth every 8 (eight) hours as needed for cough. 20 capsule 0  . cefdinir (OMNICEF) 300 MG capsule Take 1 capsule (300 mg total) by mouth every 12 (twelve) hours for 5 days. 10 capsule 0  . diphenoxylate-atropine (LOMOTIL) 2.5-0.025 MG tablet Take 1 tablet by mouth 4 (four) times daily as needed for diarrhea or loose stools. 30 tablet 0  . DULoxetine (CYMBALTA) 60 MG capsule Take 60 mg by mouth daily.    Marland Kitchen ipratropium-albuterol (DUONEB) 0.5-2.5 (3) MG/3ML SOLN Take 3 mLs by nebulization every 4 (four) hours as needed. 360 mL 1  . LORazepam (ATIVAN) 0.5 MG tablet Take 0.5 mg by mouth as needed.    . magnesium oxide (MAG-OX) 400 MG tablet Take 1 tablet (400 mg total) by mouth 2 (two) times daily. 20 tablet 0  . metoCLOPramide (REGLAN) 10 MG tablet Take 1 tablet (10 mg total) by mouth 4 (four) times daily. 60 tablet 2  . morphine (MS CONTIN) 15 MG 12 hr tablet Take 1 tablet (15 mg total) by mouth every 12 (twelve) hours. 60 tablet 0  . pantoprazole (PROTONIX) 40 MG tablet Take 1 tablet by mouth daily.    . potassium chloride SA (K-DUR,KLOR-CON) 20 MEQ tablet Take 1 tablet (20 mEq total) by mouth daily. (Patient not taking: Reported on 08/14/2017) 30 tablet 2  . prochlorperazine (COMPAZINE) 10 MG tablet Take 1 tablet (10 mg total) by mouth every 6 (six) hours as needed (Nausea or vomiting). 60 tablet 2  . promethazine (PHENERGAN) 25 MG tablet Take 1 tablet (25 mg total) by mouth every 8 (eight) hours as needed for nausea or vomiting. 30 tablet  2   No current facility-administered medications for this visit.    Facility-Administered Medications Ordered in Other Visits  Medication Dose Route Frequency Provider Last Rate Last Dose  . heparin lock flush 100 unit/mL  500 Units Intracatheter PRN Lloyd Huger, MD      . sodium chloride flush (NS) 0.9 % injection 10 mL  10 mL Intravenous PRN Lloyd Huger, MD   10 mL at 03/01/17 0841    OBJECTIVE: There were no vitals filed for this visit.   There is no height or weight on file to calculate BMI.    ECOG FS:1 - Symptomatic but completely ambulatory  General: Well-developed, well-nourished, no acute distress. Eyes:  Pink conjunctiva, anicteric sclera. HEENT: Normocephalic, moist mucous membranes, clear oropharnyx. Lungs: Clear to auscultation bilaterally. Heart: Regular rate and rhythm. No rubs, murmurs, or gallops. Abdomen: Soft, nontender, nondistended. No organomegaly noted, normoactive bowel sounds. Musculoskeletal: No edema, cyanosis, or clubbing. Neuro: Alert, answering all questions appropriately. Cranial nerves grossly intact. Skin: No rashes or petechiae noted. Psych: Normal affect.  LAB RESULTS:  Lab Results  Component Value Date   NA 142 08/16/2017   K 3.4 (L) 08/16/2017   CL 121 (H) 08/16/2017   CO2 18 (L) 08/16/2017   GLUCOSE 138 (H) 08/16/2017   BUN 26 (H) 08/16/2017   CREATININE 1.02 (H) 08/16/2017   CALCIUM 7.1 (L) 08/16/2017   PROT 7.0 08/14/2017   ALBUMIN 3.3 (L) 08/14/2017   AST 23 08/14/2017   ALT 18 08/14/2017   ALKPHOS 51 08/14/2017   BILITOT 0.4 08/14/2017   GFRNONAA 56 (L) 08/16/2017   GFRAA >60 08/16/2017    Lab Results  Component Value Date   WBC 8.1 08/15/2017   NEUTROABS 10.5 (H) 08/14/2017   HGB 8.8 (L) 08/15/2017   HCT 26.2 (L) 08/15/2017   MCV 94.8 08/15/2017   PLT 164 08/15/2017     STUDIES: Ct Abdomen Pelvis Wo Contrast  Result Date: 08/14/2017 CLINICAL DATA:  67 year old female with right flank pain, acute renal  failure since last week. Altered mental status. History of extensive stage small cell lung cancer. Currently on immunotherapy. EXAM: CT ABDOMEN AND PELVIS WITHOUT CONTRAST TECHNIQUE: Multidetector CT imaging of the abdomen and pelvis was performed following the standard protocol without IV contrast. COMPARISON:  Restaging CT chest, abdomen and Pelvis 06/14/2017 and earlier. FINDINGS: Lower chest: Visible lung bases are stable and negative. No pericardial or pleural effusion. Hepatobiliary: Hypodense liver metastases are poorly visible in the absence of IV contrast. Stable liver size and contour. The gallbladder is distended today, but there is no pericholecystic inflammation. CBD size is stable. Pancreas: Stable fatty atrophy. Spleen: Stable, negative. Adrenals/Urinary Tract: Stable and normal adrenal glands. No hydronephrosis. No perinephric stranding. Stable left renal artery calcified atherosclerosis. No hydroureter. Small left greater than right retroperitoneal lymph nodes appear stable along the course of the ureters. Mildly distended but otherwise unremarkable noncontrast urinary bladder. No perivesical stranding is evident today. Stomach/Bowel: Decompressed distal colon. Mild to moderate diverticulosis and the descending and sigmoid colon without active inflammation. Gas and stool in the transverse colon. Negative right colon and terminal ileum. Diminutive or absent appendix. No dilated small bowel. Decompressed stomach. No abdominal free air, free fluid. Vascular/Lymphatic: Extensive Aortoiliac calcified atherosclerosis. Vascular patency is not evaluated in the absence of IV contrast. Stable small lymph nodes in the abdomen and pelvis. Reproductive: Negative noncontrast appearance. Other: No pelvic free fluid. Musculoskeletal: Scattered sclerotic bone metastases appears stable since May including in the L1, L2 and L5 vertebral bodies. Chronic L4-L5 spondylolysis and spondylolisthesis with degenerative spinal  stenosis. Stable femoral head sclerosis which might be degenerative or AVN related. Stable sclerotic right proximal femur possible intertrochanteric metastasis. No new osseous abnormality. IMPRESSION: 1. No acute or inflammatory process is evident in the noncontrast abdomen or pelvis. No renal obstruction. 2. Osseous metastatic disease appears stable since 06/14/2017. Liver metastases are poorly visible without contrast. 3.  Aortic Atherosclerosis (ICD10-I70.0). Electronically Signed   By: [name] M.D.   On: 08/14/2017 15:27   Dg Chest Port 1 View  Result Date: 08/14/2017 CLINICAL DATA:  Hypotension and hypoxia EXAM: PORTABLE CHEST 1 VIEW COMPARISON:  03/22/2017 FINDINGS: Cardiac shadow is enlarged  but stable. Right chest wall port is noted and stable. The patchy nodular density seen on the prior exam have significantly improved when compare with the prior study. Some persistent scarring in the left upper lobe is noted. No sizable effusion is noted. No bony abnormality is seen. IMPRESSION: Significant improvement in nodular changes when compare with the prior exam. Some mild scarring is noted again in the left upper lobe. Electronically Signed   By: [name] M.D.   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  PLAN:  1. 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 MRI of the brain on February 04, 2017 did not reveal any metastatic disease.  Proceed with cycle 9 of maintenance Tecentriq today.  Patient will also receive Zometa today.  She receives Zometa on the odd-numbered cycles.  Return to clinic in 3 weeks for further evaluation and consideration of cycle 10.  Consider reimaging in August 2019.  2. Pain: Chronic and unchanged.  Patient previously refused a long-acting narcotic, but now has agreed to take 15 mg MS Contin  every 12 hours.  Continue Percocet as needed.   3.  Anemia: Improved after recent transfusion.  Monitor.  4.  COPD: Continue outpatient palliative care.  Continue current medications as prescribed. 5.  Disposition: Hospice and end-of-life care were previously discussed, but patient does not wish to pursue this option at this time.  She confirms that she is a DNR and is having outpatient palliative care visit. 6.  Diarrhea: Resolved.  Continue Lomotil as needed. 7.  Hypokalemia: Potassium decreased, but unchanged.  Continue oral supplementation as prescribed.    8.  Nausea: Continue Compazine and Phenergan as needed.  Patient was also given a prescription for Reglan today.  Patient expressed understanding and was in agreement with this plan. She also understands that She can call clinic at any time with any questions, concerns, or complaints.   Cancer Staging Small cell lung cancer [facility]) Staging form: Lung, AJCC 8th Edition - Clinical stage from 01/28/2017: Stage IV (cT4, cN3, pM1c) - Signed by Lloyd Huger, MD on 01/28/2017   Lloyd Huger, MD   08/20/2017 8:09 AM

## 2017-08-23 ENCOUNTER — Other Ambulatory Visit: Payer: Self-pay | Admitting: Oncology

## 2017-08-23 ENCOUNTER — Inpatient Hospital Stay: Payer: Medicare Other

## 2017-08-23 ENCOUNTER — Inpatient Hospital Stay: Payer: Medicare Other | Admitting: Oncology

## 2017-08-24 ENCOUNTER — Inpatient Hospital Stay (HOSPITAL_BASED_OUTPATIENT_CLINIC_OR_DEPARTMENT_OTHER): Payer: Medicare Other | Admitting: Oncology

## 2017-08-24 ENCOUNTER — Inpatient Hospital Stay: Payer: Medicare Other

## 2017-08-24 ENCOUNTER — Other Ambulatory Visit: Payer: Self-pay

## 2017-08-24 ENCOUNTER — Encounter: Payer: Self-pay | Admitting: Oncology

## 2017-08-24 VITALS — BP 109/73 | HR 83 | Temp 94.5°F | Resp 20

## 2017-08-24 DIAGNOSIS — I10 Essential (primary) hypertension: Secondary | ICD-10-CM

## 2017-08-24 DIAGNOSIS — K219 Gastro-esophageal reflux disease without esophagitis: Secondary | ICD-10-CM

## 2017-08-24 DIAGNOSIS — I2510 Atherosclerotic heart disease of native coronary artery without angina pectoris: Secondary | ICD-10-CM

## 2017-08-24 DIAGNOSIS — I509 Heart failure, unspecified: Secondary | ICD-10-CM | POA: Diagnosis not present

## 2017-08-24 DIAGNOSIS — N179 Acute kidney failure, unspecified: Secondary | ICD-10-CM | POA: Diagnosis not present

## 2017-08-24 DIAGNOSIS — C3490 Malignant neoplasm of unspecified part of unspecified bronchus or lung: Secondary | ICD-10-CM

## 2017-08-24 DIAGNOSIS — C787 Secondary malignant neoplasm of liver and intrahepatic bile duct: Secondary | ICD-10-CM | POA: Diagnosis not present

## 2017-08-24 DIAGNOSIS — R5383 Other fatigue: Secondary | ICD-10-CM | POA: Diagnosis not present

## 2017-08-24 DIAGNOSIS — E785 Hyperlipidemia, unspecified: Secondary | ICD-10-CM | POA: Diagnosis not present

## 2017-08-24 DIAGNOSIS — F418 Other specified anxiety disorders: Secondary | ICD-10-CM

## 2017-08-24 DIAGNOSIS — C7951 Secondary malignant neoplasm of bone: Secondary | ICD-10-CM

## 2017-08-24 DIAGNOSIS — Z5112 Encounter for antineoplastic immunotherapy: Secondary | ICD-10-CM | POA: Diagnosis not present

## 2017-08-24 DIAGNOSIS — R531 Weakness: Secondary | ICD-10-CM | POA: Diagnosis not present

## 2017-08-24 DIAGNOSIS — M129 Arthropathy, unspecified: Secondary | ICD-10-CM | POA: Diagnosis not present

## 2017-08-24 DIAGNOSIS — R4182 Altered mental status, unspecified: Secondary | ICD-10-CM | POA: Diagnosis not present

## 2017-08-24 DIAGNOSIS — Z9221 Personal history of antineoplastic chemotherapy: Secondary | ICD-10-CM | POA: Diagnosis not present

## 2017-08-24 DIAGNOSIS — J449 Chronic obstructive pulmonary disease, unspecified: Secondary | ICD-10-CM | POA: Diagnosis not present

## 2017-08-24 DIAGNOSIS — Z79899 Other long term (current) drug therapy: Secondary | ICD-10-CM

## 2017-08-24 DIAGNOSIS — I429 Cardiomyopathy, unspecified: Secondary | ICD-10-CM | POA: Diagnosis not present

## 2017-08-24 DIAGNOSIS — C3402 Malignant neoplasm of left main bronchus: Secondary | ICD-10-CM

## 2017-08-24 DIAGNOSIS — E860 Dehydration: Secondary | ICD-10-CM | POA: Diagnosis not present

## 2017-08-24 DIAGNOSIS — Z87891 Personal history of nicotine dependence: Secondary | ICD-10-CM | POA: Diagnosis not present

## 2017-08-24 DIAGNOSIS — R112 Nausea with vomiting, unspecified: Secondary | ICD-10-CM | POA: Diagnosis not present

## 2017-08-24 DIAGNOSIS — R0902 Hypoxemia: Secondary | ICD-10-CM | POA: Diagnosis not present

## 2017-08-24 DIAGNOSIS — R109 Unspecified abdominal pain: Secondary | ICD-10-CM | POA: Diagnosis not present

## 2017-08-24 LAB — COMPREHENSIVE METABOLIC PANEL
ALK PHOS: 54 U/L (ref 38–126)
ALT: 26 U/L (ref 0–44)
ANION GAP: 8 (ref 5–15)
AST: 28 U/L (ref 15–41)
Albumin: 3.7 g/dL (ref 3.5–5.0)
BUN: 24 mg/dL — ABNORMAL HIGH (ref 8–23)
CALCIUM: 9.1 mg/dL (ref 8.9–10.3)
CO2: 19 mmol/L — AB (ref 22–32)
Chloride: 110 mmol/L (ref 98–111)
Creatinine, Ser: 0.84 mg/dL (ref 0.44–1.00)
GFR calc non Af Amer: >60 mL/min (ref >60)
Glucose, Bld: 100 mg/dL — ABNORMAL HIGH (ref 70–99)
Potassium: 4.5 mmol/L (ref 3.5–5.1)
SODIUM: 137 mmol/L (ref 135–145)
Total Bilirubin: 0.4 mg/dL (ref 0.3–1.2)
Total Protein: 7.8 g/dL (ref 6.5–8.1)

## 2017-08-24 LAB — CBC WITH DIFFERENTIAL/PLATELET
BASOS ABS: 0.1 10*3/uL (ref 0–0.1)
BASOS PCT: 1 %
Eosinophils Absolute: 0.3 10*3/uL (ref 0–0.7)
Eosinophils Relative: 4 %
HEMATOCRIT: 31.8 % — AB (ref 35.0–47.0)
HEMOGLOBIN: 10.7 g/dL — AB (ref 12.0–16.0)
Lymphocytes Relative: 26 %
Lymphs Abs: 1.8 10*3/uL (ref 1.0–3.6)
MCH: 31.3 pg (ref 26.0–34.0)
MCHC: 33.7 g/dL (ref 32.0–36.0)
MCV: 93 fL (ref 80.0–100.0)
MONOS PCT: 8 %
Monocytes Absolute: 0.6 10*3/uL (ref 0.2–0.9)
NEUTROS ABS: 4.3 10*3/uL (ref 1.4–6.5)
NEUTROS PCT: 61 %
Platelets: 249 10*3/uL (ref 150–440)
RBC: 3.42 MIL/uL — AB (ref 3.80–5.20)
RDW: 15.7 % — ABNORMAL HIGH (ref 11.5–14.5)
WBC: 7 10*3/uL (ref 3.6–11.0)

## 2017-08-24 MED ORDER — SODIUM CHLORIDE 0.9% FLUSH
10.0000 mL | INTRAVENOUS | Status: DC | PRN
  Filled 2017-08-24: qty 10

## 2017-08-24 MED ORDER — SODIUM CHLORIDE 0.9% FLUSH
10.0000 mL | INTRAVENOUS | Status: DC | PRN
  Administered 2017-08-24: 10 mL via INTRAVENOUS
  Filled 2017-08-24: qty 10

## 2017-08-24 MED ORDER — HEPARIN SOD (PORK) LOCK FLUSH 100 UNIT/ML IV SOLN
500.0000 [IU] | Freq: Once | INTRAVENOUS | Status: DC | PRN
  Filled 2017-08-24: qty 5

## 2017-08-24 MED ORDER — SODIUM CHLORIDE 0.9 % IV SOLN
Freq: Once | INTRAVENOUS | Status: AC
  Administered 2017-08-24: 10:00:00 via INTRAVENOUS
  Filled 2017-08-24: qty 1000

## 2017-08-24 MED ORDER — HEPARIN SOD (PORK) LOCK FLUSH 100 UNIT/ML IV SOLN
500.0000 [IU] | Freq: Once | INTRAVENOUS | Status: AC
  Administered 2017-08-24: 500 [IU] via INTRAVENOUS

## 2017-08-24 MED ORDER — SODIUM CHLORIDE 0.9 % IV SOLN
1200.0000 mg | Freq: Once | INTRAVENOUS | Status: AC
  Administered 2017-08-24: 1200 mg via INTRAVENOUS
  Filled 2017-08-24: qty 20

## 2017-08-24 NOTE — Progress Notes (Signed)
Here for follow up. D/c the hosp last wed d/c  Renal failure per pt. Per pt " feeling better "

## 2017-08-24 NOTE — Progress Notes (Signed)
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  INTERVAL HISTORY: Patient returns to clinic today for further evaluation and continuation of maintenance Tecentriq.  She was recently admitted to the hospital with sepsis, dehydration, and acute renal failure.  This has now resolved and patient feels almost back to her baseline.  She continues to have intermittent nausea and vomiting that appears to be unrelated to her treatments. Her pain is well controlled on her current narcotic regimen.  She has no neurologic complaints.  She denies any fevers.  She has a fair appetite and denies weight loss.  She has chronic shortness of breath, but denies any chest pain or hemoptysis. She has no abdominal pain.  She denies any melena or hematochezia.  Patient offers no further specific complaints today.  REVIEW OF SYSTEMS:   Review of Systems  Constitutional: Positive for malaise/fatigue. Negative for fever and weight loss.  Respiratory: Positive for shortness of breath. Negative for cough, hemoptysis and wheezing.   Cardiovascular: Negative.  Negative for chest pain and leg swelling.  Gastrointestinal: Positive for nausea and vomiting. Negative for abdominal pain, blood in stool, diarrhea and melena.  Genitourinary: Negative.  Negative for dysuria and frequency.  Musculoskeletal: Positive for back pain, joint pain and neck pain.  Skin: Negative.  Negative for rash.  Neurological: Positive for weakness. Negative for sensory change and focal weakness.  Psychiatric/Behavioral: Negative.  The patient is not nervous/anxious.     As per HPI. Otherwise, a complete review of systems is negative.  PAST MEDICAL  HISTORY: Past Medical History:  Diagnosis Date  . Anxiety   . Arthritis   . Asthma    COPD  . Cancer ([place])    Liver  . CAP (community acquired pneumonia) 06/30/2014  . Cardiomyopathy ([place])    nonischemic (EF 35-40%)  . CHF (congestive heart failure) ([place])   . COPD (chronic obstructive pulmonary disease) ([place])   . Depression   . Dyspnea   . GERD (gastroesophageal reflux disease)   . Hyperlipidemia   . Hypertension   . Pneumonia   . PVC's (premature ventricular contractions)    states 10 years ago  . Sepsis ([place]) 06/30/2014  . SOB (shortness of breath) 05/08/2014  . Spinal cord injury at T7-T12 level [facility])    2016    PAST SURGICAL HISTORY: Past Surgical History:  Procedure Laterality Date  . APPENDECTOMY    . CARDIAC CATHETERIZATION    . CATARACT EXTRACTION     right eye   . CESAREAN SECTION    . COLONOSCOPY    . LAMINOTOMY    . LUMBAR LAMINECTOMY/DECOMPRESSION MICRODISCECTOMY Left 08/01/2016   Procedure: Left Lumbar Four-Five Laminectomy and Foraminotomy;  Surgeon: Earnie Larsson, MD;  Location: [place];  Service: Neurosurgery;  Laterality: Left;  . PORTA CATH INSERTION N/A 02/03/2017   Procedure: PORTA CATH INSERTION;  Surgeon: Katha Cabal, MD;  Location: [place] [facility];  Service: Cardiovascular;  Laterality: N/A;    FAMILY HISTORY: Family History  Problem Relation Age of Onset  . Heart attack Father 64       MI x 3     ADVANCED DIRECTIVES (Y/N):  N  HEALTH MAINTENANCE: Social History   Tobacco Use  .  Smoking status: Former Smoker    Packs/day: 0.50    Years: 50.00    Pack years: 25.00    Types: Cigarettes    Last attempt to quit: 01/27/2015    Years since quitting: 2.5  . Smokeless tobacco: Never Used  Substance Use Topics  . Alcohol use: No  . Drug use: No     Colonoscopy:  PAP:  Bone density:  Lipid panel:  Allergies  Allergen Reactions  . Fish Allergy Anaphylaxis    Per pt after eating [facility] she had throat swelling and SOB. MSY    . Fentanyl     Overly sedated and groggy, ineffective     Current Outpatient Medications  Medication Sig Dispense Refill  . ADVAIR DISKUS 250-50 MCG/DOSE AEPB Inhale 1 puff into the lungs 2 (two) times daily.    Marland Kitchen alendronate (FOSAMAX) 70 MG tablet Take 70 mg by mouth every Thursday.     . ARIPiprazole (ABILIFY) 5 MG tablet Take 5 mg by mouth daily.    . benzonatate (TESSALON) 100 MG capsule Take 1 capsule (100 mg total) by mouth every 8 (eight) hours as needed for cough. 20 capsule 0  . diphenoxylate-atropine (LOMOTIL) 2.5-0.025 MG tablet Take 1 tablet by mouth 4 (four) times daily as needed for diarrhea or loose stools. 30 tablet 0  . DULoxetine (CYMBALTA) 60 MG capsule Take 60 mg by mouth daily.    Marland Kitchen ipratropium-albuterol (DUONEB) 0.5-2.5 (3) MG/3ML SOLN Take 3 mLs by nebulization every 4 (four) hours as needed. 360 mL 1  . magnesium oxide (MAG-OX) 400 MG tablet Take 1 tablet (400 mg total) by mouth 2 (two) times daily. 20 tablet 0  . morphine (MS CONTIN) 15 MG 12 hr tablet Take 1 tablet (15 mg total) by mouth every 12 (twelve) hours. 60 tablet 0  . pantoprazole (PROTONIX) 40 MG tablet Take 1 tablet by mouth daily.    . potassium chloride SA (K-DUR,KLOR-CON) 20 MEQ tablet Take 1 tablet (20 mEq total) by mouth daily. 30 tablet 2  . prochlorperazine (COMPAZINE) 10 MG tablet Take 1 tablet (10 mg total) by mouth every 6 (six) hours as needed (Nausea or vomiting). 60 tablet 2  . promethazine (PHENERGAN) 25 MG tablet Take 1 tablet (25 mg total) by mouth every 8 (eight) hours as needed for nausea or vomiting. 30 tablet 2  . albuterol (PROAIR HFA) 108 (90 Base) MCG/ACT inhaler Inhale 2 puffs into the lungs every 6 (six) hours as needed for wheezing or shortness of breath.     Marland Kitchen LORazepam (ATIVAN) 0.5 MG tablet Take 0.5 mg by mouth as needed.    . metoCLOPramide (REGLAN) 10 MG tablet Take 1 tablet (10 mg total) by mouth 4 (four) times daily. (Patient not taking: Reported on 08/24/2017) 60 tablet 2    No current facility-administered medications for this visit.    Facility-Administered Medications Ordered in Other Visits  Medication Dose Route Frequency Provider Last Rate Last Dose  . heparin lock flush 100 unit/mL  500 Units Intracatheter PRN Lloyd Huger, MD      . sodium chloride flush (NS) 0.9 % injection 10 mL  10 mL Intravenous PRN Lloyd Huger, MD   10 mL at 03/01/17 0841    OBJECTIVE: Vitals:   08/24/17 0900  BP: 109/73  Pulse: 83  Resp: 20  Temp: (!) 94.5 F (34.7 C)     There is no height or weight on file to calculate BMI.    ECOG FS:1 -  Symptomatic but completely ambulatory  General: Well-developed, well-nourished, no acute distress.  Sitting in a wheelchair. Eyes: [name] conjunctiva, anicteric sclera. Lungs: Clear to auscultation bilaterally. Heart: Regular rate and rhythm. No rubs, murmurs, or gallops. Abdomen: Soft, nontender, nondistended. No organomegaly noted, normoactive bowel sounds. Musculoskeletal: No edema, cyanosis, or clubbing. Neuro: Alert, answering all questions appropriately. Cranial nerves grossly intact. Skin: No rashes or petechiae noted. Psych: Normal affect. Lymphatics: No cervical, calvicular, axillary or inguinal LAD.  LAB RESULTS:  Lab Results  Component Value Date   NA 137 08/24/2017   K 4.5 08/24/2017   CL 110 08/24/2017   CO2 19 (L) 08/24/2017   GLUCOSE 100 (H) 08/24/2017   BUN 24 (H) 08/24/2017   CREATININE 0.84 08/24/2017   CALCIUM 9.1 08/24/2017   PROT 7.8 08/24/2017   ALBUMIN 3.7 08/24/2017   AST 28 08/24/2017   ALT 26 08/24/2017   ALKPHOS 54 08/24/2017   BILITOT 0.4 08/24/2017   GFRNONAA >60 08/24/2017   GFRAA >60 08/24/2017    Lab Results  Component Value Date   WBC 7.0 08/24/2017   NEUTROABS 4.3 08/24/2017   HGB 10.7 (L) 08/24/2017   HCT 31.8 (L) 08/24/2017   MCV 93.0 08/24/2017   PLT 249 08/24/2017     STUDIES: Ct Abdomen Pelvis Wo Contrast  Result Date: 08/14/2017 CLINICAL DATA:   67 year old female with right flank pain, acute renal failure since last week. Altered mental status. History of extensive stage small cell lung cancer. Currently on immunotherapy. EXAM: CT ABDOMEN AND PELVIS WITHOUT CONTRAST TECHNIQUE: Multidetector CT imaging of the abdomen and pelvis was performed following the standard protocol without IV contrast. COMPARISON:  Restaging CT chest, abdomen and Pelvis 06/14/2017 and earlier. FINDINGS: Lower chest: Visible lung bases are stable and negative. No pericardial or pleural effusion. Hepatobiliary: Hypodense liver metastases are poorly visible in the absence of IV contrast. Stable liver size and contour. The gallbladder is distended today, but there is no pericholecystic inflammation. CBD size is stable. Pancreas: Stable fatty atrophy. Spleen: Stable, negative. Adrenals/Urinary Tract: Stable and normal adrenal glands. No hydronephrosis. No perinephric stranding. Stable left renal artery calcified atherosclerosis. No hydroureter. Small left greater than right retroperitoneal lymph nodes appear stable along the course of the ureters. Mildly distended but otherwise unremarkable noncontrast urinary bladder. No perivesical stranding is evident today. Stomach/Bowel: Decompressed distal colon. Mild to moderate diverticulosis and the descending and sigmoid colon without active inflammation. Gas and stool in the transverse colon. Negative right colon and terminal ileum. Diminutive or absent appendix. No dilated small bowel. Decompressed stomach. No abdominal free air, free fluid. Vascular/Lymphatic: Extensive Aortoiliac calcified atherosclerosis. Vascular patency is not evaluated in the absence of IV contrast. Stable small lymph nodes in the abdomen and pelvis. Reproductive: Negative noncontrast appearance. Other: No pelvic free fluid. Musculoskeletal: Scattered sclerotic bone metastases appears stable since May including in the L1, L2 and L5 vertebral bodies. Chronic L4-L5  spondylolysis and spondylolisthesis with degenerative spinal stenosis. Stable femoral head sclerosis which might be degenerative or AVN related. Stable sclerotic right proximal femur possible intertrochanteric metastasis. No new osseous abnormality. IMPRESSION: 1. No acute or inflammatory process is evident in the noncontrast abdomen or pelvis. No renal obstruction. 2. Osseous metastatic disease appears stable since 06/14/2017. Liver metastases are poorly visible without contrast. 3.  Aortic Atherosclerosis (ICD10-I70.0). Electronically Signed   By: [name] M.D.   On: 08/14/2017 15:27   Dg Chest Port 1 View  Result Date: 08/14/2017 CLINICAL DATA:  Hypotension and hypoxia EXAM: PORTABLE  CHEST 1 VIEW COMPARISON:  03/22/2017 FINDINGS: Cardiac shadow is enlarged but stable. Right chest wall port is noted and stable. The patchy nodular density seen on the prior exam have significantly improved when compare with the prior study. Some persistent scarring in the left upper lobe is noted. No sizable effusion is noted. No bony abnormality is seen. IMPRESSION: Significant improvement in nodular changes when compare with the prior exam. Some mild scarring is noted again in the left upper lobe. Electronically Signed   By: [name] M.D.   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  PLAN:  1. 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 MRI of the brain on February 04, 2017 did not reveal any metastatic disease.  Proceed with cycle 10 of maintenance Tecentriq today.  Patient will require Zometa at the odd-numbered treatments.  Return to clinic in 3 weeks for continuation of treatment and then in 6 weeks for further evaluation and consideration of cycle 12.  Plan to reimage in approximately August 2019.  2. Pain: Chronic and unchanged.   Continue 50 mg MS Contin every 12 hours and Percocet as needed. 3.  Anemia: Patient's hemoglobin significantly improved to 10.7 after transfusion.  Monitor. 4.  COPD: Continue outpatient palliative care.  Continue current medications as prescribed. 5.  Disposition: Hospice and end-of-life care were previously discussed, but patient does not wish to pursue this option at this time.  She confirms that she is a DNR and is having outpatient palliative care visit. 6.  Diarrhea: Resolved.  Continue Lomotil as needed. 7.  Hypokalemia: Resolved.  Continue oral supplementation as prescribed.    8.  Nausea: Continue Compazine, Phenergan, and Reglan as prescribed. 9.  Acute renal failure: Resolved.  Patient expressed understanding and was in agreement with this plan. She also understands that She can call clinic at any time with any questions, concerns, or complaints.   Cancer Staging Small cell lung cancer [facility]) Staging form: Lung, AJCC 8th Edition - Clinical stage from 01/28/2017: Stage IV (cT4, cN3, pM1c) - Signed by Lloyd Huger, MD on 01/28/2017   Lloyd Huger, MD   08/27/2017 7:42 AM

## 2017-08-25 LAB — THYROID PANEL WITH TSH
Free Thyroxine Index: 2.6 (ref 1.2–4.9)
T3 Uptake Ratio: 26 % (ref 24–39)
T4, Total: 9.9 ug/dL (ref 4.5–12.0)
TSH: 4.09 u[IU]/mL (ref 0.450–4.500)

## 2017-09-01 ENCOUNTER — Other Ambulatory Visit: Payer: Self-pay | Admitting: Oncology

## 2017-09-01 ENCOUNTER — Other Ambulatory Visit: Payer: Self-pay | Admitting: *Deleted

## 2017-09-01 MED ORDER — MORPHINE SULFATE ER 15 MG PO TBCR: 15.0000 mg | EXTENDED_RELEASE_TABLET | Freq: Two times a day (BID) | ORAL | 0 refills | Status: DC

## 2017-09-01 NOTE — Progress Notes (Signed)
Patient called for refill. Covering Dr.Finnegan who is on vacation.  Refill sent electronically.

## 2017-09-07 ENCOUNTER — Other Ambulatory Visit: Payer: Self-pay | Admitting: *Deleted

## 2017-09-07 DIAGNOSIS — C3490 Malignant neoplasm of unspecified part of unspecified bronchus or lung: Secondary | ICD-10-CM

## 2017-09-08 ENCOUNTER — Other Ambulatory Visit: Payer: Self-pay | Admitting: Oncology

## 2017-09-08 ENCOUNTER — Ambulatory Visit: Payer: Medicare Other | Admitting: Cardiovascular Disease

## 2017-09-11 DIAGNOSIS — L853 Xerosis cutis: Secondary | ICD-10-CM | POA: Diagnosis not present

## 2017-09-11 DIAGNOSIS — L821 Other seborrheic keratosis: Secondary | ICD-10-CM | POA: Diagnosis not present

## 2017-09-11 DIAGNOSIS — L820 Inflamed seborrheic keratosis: Secondary | ICD-10-CM | POA: Diagnosis not present

## 2017-09-11 DIAGNOSIS — L219 Seborrheic dermatitis, unspecified: Secondary | ICD-10-CM | POA: Diagnosis not present

## 2017-09-12 ENCOUNTER — Other Ambulatory Visit: Payer: Self-pay | Admitting: Cardiovascular Disease

## 2017-09-13 ENCOUNTER — Other Ambulatory Visit: Payer: Self-pay

## 2017-09-13 ENCOUNTER — Inpatient Hospital Stay: Payer: Medicare Other

## 2017-09-13 ENCOUNTER — Encounter: Payer: Self-pay | Admitting: Oncology

## 2017-09-13 ENCOUNTER — Inpatient Hospital Stay (HOSPITAL_BASED_OUTPATIENT_CLINIC_OR_DEPARTMENT_OTHER): Payer: Medicare Other | Admitting: Oncology

## 2017-09-13 ENCOUNTER — Other Ambulatory Visit: Payer: Self-pay | Admitting: *Deleted

## 2017-09-13 DIAGNOSIS — Z87891 Personal history of nicotine dependence: Secondary | ICD-10-CM

## 2017-09-13 DIAGNOSIS — R5383 Other fatigue: Secondary | ICD-10-CM

## 2017-09-13 DIAGNOSIS — M129 Arthropathy, unspecified: Secondary | ICD-10-CM

## 2017-09-13 DIAGNOSIS — C7951 Secondary malignant neoplasm of bone: Secondary | ICD-10-CM | POA: Diagnosis not present

## 2017-09-13 DIAGNOSIS — C787 Secondary malignant neoplasm of liver and intrahepatic bile duct: Secondary | ICD-10-CM

## 2017-09-13 DIAGNOSIS — I10 Essential (primary) hypertension: Secondary | ICD-10-CM

## 2017-09-13 DIAGNOSIS — R109 Unspecified abdominal pain: Secondary | ICD-10-CM | POA: Diagnosis not present

## 2017-09-13 DIAGNOSIS — R112 Nausea with vomiting, unspecified: Secondary | ICD-10-CM | POA: Diagnosis not present

## 2017-09-13 DIAGNOSIS — J449 Chronic obstructive pulmonary disease, unspecified: Secondary | ICD-10-CM

## 2017-09-13 DIAGNOSIS — N179 Acute kidney failure, unspecified: Secondary | ICD-10-CM

## 2017-09-13 DIAGNOSIS — I509 Heart failure, unspecified: Secondary | ICD-10-CM

## 2017-09-13 DIAGNOSIS — R531 Weakness: Secondary | ICD-10-CM | POA: Diagnosis not present

## 2017-09-13 DIAGNOSIS — R4182 Altered mental status, unspecified: Secondary | ICD-10-CM | POA: Diagnosis not present

## 2017-09-13 DIAGNOSIS — C3402 Malignant neoplasm of left main bronchus: Secondary | ICD-10-CM | POA: Diagnosis not present

## 2017-09-13 DIAGNOSIS — C3490 Malignant neoplasm of unspecified part of unspecified bronchus or lung: Secondary | ICD-10-CM

## 2017-09-13 DIAGNOSIS — Z9221 Personal history of antineoplastic chemotherapy: Secondary | ICD-10-CM

## 2017-09-13 DIAGNOSIS — F418 Other specified anxiety disorders: Secondary | ICD-10-CM

## 2017-09-13 DIAGNOSIS — K219 Gastro-esophageal reflux disease without esophagitis: Secondary | ICD-10-CM

## 2017-09-13 DIAGNOSIS — Z5112 Encounter for antineoplastic immunotherapy: Secondary | ICD-10-CM

## 2017-09-13 DIAGNOSIS — I2510 Atherosclerotic heart disease of native coronary artery without angina pectoris: Secondary | ICD-10-CM

## 2017-09-13 DIAGNOSIS — R0902 Hypoxemia: Secondary | ICD-10-CM

## 2017-09-13 DIAGNOSIS — Z79899 Other long term (current) drug therapy: Secondary | ICD-10-CM

## 2017-09-13 DIAGNOSIS — E785 Hyperlipidemia, unspecified: Secondary | ICD-10-CM

## 2017-09-13 DIAGNOSIS — I429 Cardiomyopathy, unspecified: Secondary | ICD-10-CM

## 2017-09-13 DIAGNOSIS — E860 Dehydration: Secondary | ICD-10-CM

## 2017-09-13 LAB — CBC WITH DIFFERENTIAL/PLATELET
Basophils Absolute: 0 10*3/uL (ref 0–0.1)
Basophils Relative: 1 %
EOS ABS: 0.2 10*3/uL (ref 0–0.7)
EOS PCT: 3 %
HCT: 32.7 % — ABNORMAL LOW (ref 35.0–47.0)
Hemoglobin: 10.9 g/dL — ABNORMAL LOW (ref 12.0–16.0)
LYMPHS ABS: 1.6 10*3/uL (ref 1.0–3.6)
Lymphocytes Relative: 20 %
MCH: 30.3 pg (ref 26.0–34.0)
MCHC: 33.3 g/dL (ref 32.0–36.0)
MCV: 91.2 fL (ref 80.0–100.0)
MONO ABS: 0.5 10*3/uL (ref 0.2–0.9)
MONOS PCT: 6 %
Neutro Abs: 5.6 10*3/uL (ref 1.4–6.5)
Neutrophils Relative %: 70 %
PLATELETS: 222 10*3/uL (ref 150–440)
RBC: 3.59 MIL/uL — AB (ref 3.80–5.20)
RDW: 15 % — AB (ref 11.5–14.5)
WBC: 7.9 10*3/uL (ref 3.6–11.0)

## 2017-09-13 LAB — COMPREHENSIVE METABOLIC PANEL
ALBUMIN: 3.5 g/dL (ref 3.5–5.0)
ALT: 18 U/L (ref 0–44)
AST: 23 U/L (ref 15–41)
Alkaline Phosphatase: 59 U/L (ref 38–126)
Anion gap: 9 (ref 5–15)
BUN: 14 mg/dL (ref 8–23)
CHLORIDE: 110 mmol/L (ref 98–111)
CO2: 18 mmol/L — ABNORMAL LOW (ref 22–32)
CREATININE: 1.08 mg/dL — AB (ref 0.44–1.00)
Calcium: 8.7 mg/dL — ABNORMAL LOW (ref 8.9–10.3)
GFR calc Af Amer: >60 mL/min (ref >60)
GFR, EST NON AFRICAN AMERICAN: 52 mL/min — AB (ref >60)
GLUCOSE: 136 mg/dL — AB (ref 70–99)
Potassium: 3.5 mmol/L (ref 3.5–5.1)
Sodium: 137 mmol/L (ref 135–145)
Total Bilirubin: 0.2 mg/dL — ABNORMAL LOW (ref 0.3–1.2)
Total Protein: 7.6 g/dL (ref 6.5–8.1)

## 2017-09-13 LAB — MAGNESIUM: MAGNESIUM: 1.8 mg/dL (ref 1.7–2.4)

## 2017-09-13 MED ORDER — SODIUM CHLORIDE 0.9 % IV SOLN
Freq: Once | INTRAVENOUS | Status: AC
  Administered 2017-09-13: 11:00:00 via INTRAVENOUS
  Filled 2017-09-13: qty 1000

## 2017-09-13 MED ORDER — SODIUM CHLORIDE 0.9 % IV SOLN
1200.0000 mg | Freq: Once | INTRAVENOUS | Status: AC
  Administered 2017-09-13: 1200 mg via INTRAVENOUS
  Filled 2017-09-13: qty 20

## 2017-09-13 MED ORDER — MORPHINE SULFATE ER 15 MG PO TBCR: 15.0000 mg | EXTENDED_RELEASE_TABLET | Freq: Two times a day (BID) | ORAL | 0 refills | Status: DC

## 2017-09-13 MED ORDER — HEPARIN SOD (PORK) LOCK FLUSH 100 UNIT/ML IV SOLN
500.0000 [IU] | Freq: Once | INTRAVENOUS | Status: AC | PRN
  Administered 2017-09-13: 500 [IU]
  Filled 2017-09-13: qty 5

## 2017-09-13 NOTE — Progress Notes (Signed)
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  INTERVAL HISTORY: Patient returns to clinic for further evaluation and consideration of maintenance Tecentriq.  Patient was last seen by primary medical oncologist Dr. Grayland Ormond on 08/24/2016 where she appeared to be back to baseline after recent admission to hospital with sepsis, dehydration and acute renal failure.  She continued to complain of intermittent nausea and vomiting   But otherwise felt well.  Today patient states she is fully recovered since being admitted to the hospital.  The confusion has subsided.  Has occasional nausea but is resolved with antiemetics.  Does not complain of pain today.  She denies any neurological complaints, fevers, weight loss, shortness of breath, chest pain, hemoptysis, abdominal pain, melena or hematuria.   REVIEW OF SYSTEMS:   Review of Systems  Constitutional: Positive for malaise/fatigue. Negative for chills, fever and weight loss.  HENT: Negative for congestion and ear pain.   Eyes: Negative.  Negative for blurred vision and double vision.  Respiratory: Positive for shortness of breath (With exertion). Negative for cough and sputum production.   Cardiovascular: Negative.  Negative for chest pain, palpitations and leg swelling.  Gastrointestinal: Positive for nausea. Negative for abdominal pain, constipation, diarrhea and vomiting.  Genitourinary: Negative for dysuria, frequency and urgency.  Musculoskeletal: Negative for back pain and falls.  Skin: Negative.  Negative for rash.  Neurological: Positive for weakness. Negative for headaches.  Endo/Heme/Allergies: Negative.  Does not bruise/bleed easily.    Psychiatric/Behavioral: Negative.  Negative for depression. The patient is not nervous/anxious and does not have insomnia.     As per HPI. Otherwise, a complete review of systems is negative.  PAST MEDICAL HISTORY: Past Medical History:  Diagnosis Date  . Anxiety   . Arthritis   . Asthma    COPD  . Cancer ([place])    Liver  . CAP (community acquired pneumonia) 06/30/2014  . Cardiomyopathy ([place])    nonischemic (EF 35-40%)  . CHF (congestive heart failure) ([place])   . COPD (chronic obstructive pulmonary disease) ([place])   . Depression   . Dyspnea   . GERD (gastroesophageal reflux disease)   . Hyperlipidemia   . Hypertension   . Pneumonia   . PVC's (premature ventricular contractions)    states 10 years ago  . Sepsis ([place]) 06/30/2014  . SOB (shortness of breath) 05/08/2014  . Spinal cord injury at T7-T12 level [facility])    2016    PAST SURGICAL HISTORY: Past Surgical History:  Procedure Laterality Date  . APPENDECTOMY    . CARDIAC CATHETERIZATION    . CATARACT EXTRACTION     right eye   . CESAREAN SECTION    . COLONOSCOPY    . LAMINOTOMY    . LUMBAR LAMINECTOMY/DECOMPRESSION MICRODISCECTOMY Left 08/01/2016   Procedure: Left Lumbar Four-Five Laminectomy and Foraminotomy;  Surgeon: Earnie Larsson, MD;  Location: [place];  Service: Neurosurgery;  Laterality: Left;  . PORTA CATH INSERTION N/A 02/03/2017   Procedure: PORTA CATH INSERTION;  Surgeon: Katha Cabal, MD;  Location: [place] [facility];  Service: Cardiovascular;  Laterality: N/A;    FAMILY HISTORY: Family History  Problem Relation Age of Onset  . Heart attack Father 61  MI x 3     ADVANCED DIRECTIVES (Y/N):  N  HEALTH MAINTENANCE: Social History   Tobacco Use  . Smoking status: Former Smoker    Packs/day: 0.50    Years: 50.00    Pack years: 25.00    Types: Cigarettes    Last attempt to quit: 01/27/2015    Years since quitting: 2.6  . Smokeless tobacco: Never Used  Substance Use Topics  . Alcohol  use: No  . Drug use: No     Colonoscopy:  PAP:  Bone density:  Lipid panel:  Allergies  Allergen Reactions  . Fish Allergy Anaphylaxis    Per pt after eating [facility] she had throat swelling and SOB. MSY   . Fentanyl     Overly sedated and groggy, ineffective     Current Outpatient Medications  Medication Sig Dispense Refill  . ADVAIR DISKUS 250-50 MCG/DOSE AEPB Inhale 1 puff into the lungs 2 (two) times daily.    Marland Kitchen albuterol (PROAIR HFA) 108 (90 Base) MCG/ACT inhaler Inhale 2 puffs into the lungs every 6 (six) hours as needed for wheezing or shortness of breath.     Marland Kitchen alendronate (FOSAMAX) 70 MG tablet Take 70 mg by mouth every Thursday.     . ARIPiprazole (ABILIFY) 5 MG tablet Take 5 mg by mouth daily.    . DULoxetine (CYMBALTA) 60 MG capsule Take 60 mg by mouth daily.    Marland Kitchen ipratropium-albuterol (DUONEB) 0.5-2.5 (3) MG/3ML SOLN Take 3 mLs by nebulization every 4 (four) hours as needed. 360 mL 1  . LORazepam (ATIVAN) 0.5 MG tablet Take 0.5 mg by mouth as needed.    . metoCLOPramide (REGLAN) 10 MG tablet Take 1 tablet (10 mg total) by mouth 4 (four) times daily. 60 tablet 2  . morphine (MS CONTIN) 15 MG 12 hr tablet Take 1 tablet (15 mg total) by mouth every 12 (twelve) hours. 30 tablet 0  . pantoprazole (PROTONIX) 40 MG tablet Take 1 tablet by mouth daily.    . potassium chloride SA (K-DUR,KLOR-CON) 20 MEQ tablet Take 1 tablet (20 mEq total) by mouth daily. 30 tablet 2  . prochlorperazine (COMPAZINE) 10 MG tablet Take 1 tablet (10 mg total) by mouth every 6 (six) hours as needed (Nausea or vomiting). 60 tablet 2  . promethazine (PHENERGAN) 25 MG tablet Take 1 tablet (25 mg total) by mouth every 8 (eight) hours as needed for nausea or vomiting. 30 tablet 2  . sacubitril-valsartan (ENTRESTO) 49-51 MG Take 1 tablet by mouth 2 (two) times daily. 60 tablet 3  . benzonatate (TESSALON) 100 MG capsule Take 1 capsule (100 mg total) by mouth every 8 (eight) hours as needed for cough.  (Patient not taking: Reported on 09/13/2017) 20 capsule 0  . diphenoxylate-atropine (LOMOTIL) 2.5-0.025 MG tablet Take 1 tablet by mouth 4 (four) times daily as needed for diarrhea or loose stools. (Patient not taking: Reported on 09/13/2017) 30 tablet 0   No current facility-administered medications for this visit.    Facility-Administered Medications Ordered in Other Visits  Medication Dose Route Frequency Provider Last Rate Last Dose  . sodium chloride flush (NS) 0.9 % injection 10 mL  10 mL Intravenous PRN Lloyd Huger, MD   10 mL at 03/01/17 0841    OBJECTIVE: Vitals:   09/13/17 0951  BP: 94/64  Pulse: 76  Resp: (!) 22  Temp: (!) 97.1 F (36.2 C)  SpO2: 95%     Body mass index is 33.61 kg/m.  ECOG FS:1 - Symptomatic but completely ambulatory  Physical Exam  Constitutional: She is oriented to person, place, and time. Vital signs are normal. She appears well-developed and well-nourished.  Obese  HENT:  Head: Normocephalic and atraumatic.  Eyes: Pupils are equal, round, and reactive to light.  Neck: Normal range of motion.  Cardiovascular: Normal rate, regular rhythm and normal heart sounds.  No murmur heard. Pulmonary/Chest: Effort normal and breath sounds normal. She has no wheezes.  Abdominal: Soft. Normal appearance and bowel sounds are normal. She exhibits no distension. There is no tenderness.  Musculoskeletal: Normal range of motion. She exhibits no edema.  Neurological: She is alert and oriented to person, place, and time.  Skin: Skin is warm and dry. No rash noted.  Psychiatric: Judgment normal.    LAB RESULTS:  Lab Results  Component Value Date   NA 137 09/13/2017   K 3.5 09/13/2017   CL 110 09/13/2017   CO2 18 (L) 09/13/2017   GLUCOSE 136 (H) 09/13/2017   BUN 14 09/13/2017   CREATININE 1.08 (H) 09/13/2017   CALCIUM 8.7 (L) 09/13/2017   PROT 7.6 09/13/2017   ALBUMIN 3.5 09/13/2017   AST 23 09/13/2017   ALT 18 09/13/2017   ALKPHOS 59 09/13/2017    BILITOT 0.2 (L) 09/13/2017   GFRNONAA 52 (L) 09/13/2017   GFRAA >60 09/13/2017    Lab Results  Component Value Date   WBC 7.9 09/13/2017   NEUTROABS 5.6 09/13/2017   HGB 10.9 (L) 09/13/2017   HCT 32.7 (L) 09/13/2017   MCV 91.2 09/13/2017   PLT 222 09/13/2017     STUDIES: Ct Abdomen Pelvis Wo Contrast  Result Date: 08/14/2017 CLINICAL DATA:  67 year old female with right flank pain, acute renal failure since last week. Altered mental status. History of extensive stage small cell lung cancer. Currently on immunotherapy. EXAM: CT ABDOMEN AND PELVIS WITHOUT CONTRAST TECHNIQUE: Multidetector CT imaging of the abdomen and pelvis was performed following the standard protocol without IV contrast. COMPARISON:  Restaging CT chest, abdomen and Pelvis 06/14/2017 and earlier. FINDINGS: Lower chest: Visible lung bases are stable and negative. No pericardial or pleural effusion. Hepatobiliary: Hypodense liver metastases are poorly visible in the absence of IV contrast. Stable liver size and contour. The gallbladder is distended today, but there is no pericholecystic inflammation. CBD size is stable. Pancreas: Stable fatty atrophy. Spleen: Stable, negative. Adrenals/Urinary Tract: Stable and normal adrenal glands. No hydronephrosis. No perinephric stranding. Stable left renal artery calcified atherosclerosis. No hydroureter. Small left greater than right retroperitoneal lymph nodes appear stable along the course of the ureters. Mildly distended but otherwise unremarkable noncontrast urinary bladder. No perivesical stranding is evident today. Stomach/Bowel: Decompressed distal colon. Mild to moderate diverticulosis and the descending and sigmoid colon without active inflammation. Gas and stool in the transverse colon. Negative right colon and terminal ileum. Diminutive or absent appendix. No dilated small bowel. Decompressed stomach. No abdominal free air, free fluid. Vascular/Lymphatic: Extensive Aortoiliac  calcified atherosclerosis. Vascular patency is not evaluated in the absence of IV contrast. Stable small lymph nodes in the abdomen and pelvis. Reproductive: Negative noncontrast appearance. Other: No pelvic free fluid. Musculoskeletal: Scattered sclerotic bone metastases appears stable since May including in the L1, L2 and L5 vertebral bodies. Chronic L4-L5 spondylolysis and spondylolisthesis with degenerative spinal stenosis. Stable femoral head sclerosis which might be degenerative or AVN related. Stable sclerotic right proximal femur possible intertrochanteric metastasis. No new osseous abnormality. IMPRESSION: 1. No acute or inflammatory process is evident in the noncontrast  abdomen or pelvis. No renal obstruction. 2. Osseous metastatic disease appears stable since 06/14/2017. Liver metastases are poorly visible without contrast. 3.  Aortic Atherosclerosis (ICD10-I70.0). Electronically Signed   By: [name] M.D.   On: 08/14/2017 15:27   Dg Chest Port 1 View  Result Date: 08/14/2017 CLINICAL DATA:  Hypotension and hypoxia EXAM: PORTABLE CHEST 1 VIEW COMPARISON:  03/22/2017 FINDINGS: Cardiac shadow is enlarged but stable. Right chest wall port is noted and stable. The patchy nodular density seen on the prior exam have significantly improved when compare with the prior study. Some persistent scarring in the left upper lobe is noted. No sizable effusion is noted. No bony abnormality is seen. IMPRESSION: Significant improvement in nodular changes when compare with the prior exam. Some mild scarring is noted again in the left upper lobe. Electronically Signed   By: [name] M.D.   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  PLAN:  1. 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 MRI of the brain on February 04, 2017 did not  reveal any metastatic disease.  Proceed with cycle 11 of maintenance Tecentriq today.  She will receive Zometa today as well.  Per Dr. Gary Fleet note she will need reimaging in August 2018.  We will get orders placed today and scheduled prior to next treatment. RTC 3 weeks for cycle 12 of Tecentriq.  TSH pending during dictation but was previously 1.090 2 weeks ago. 2. Pain: Does not complain of this today.  Continue narcotic regimen. Needs refill on MS Contin 15 mg BID.  Narcotic registry checked and okay for refill. Due on 09/14/17. 3.  Anemia: Has required transfusions in the past.  Hemoglobin stable today at 10.9. 4.  COPD: Continue outpatient palliative care.  Continue current medications as prescribed. 5.  Diarrhea: Resolved.  Continue Lomotil as needed. 6.  Confusion: Likely due to acute renal failure and electrolyte imbalances.  Resolved.  Greater than 50% was spent in counseling and coordination of care with this patient including but not limited to discussion of the relevant topics above (See A&P) including, but not limited to diagnosis and management of acute and chronic medical conditions.   Patient expressed understanding and was in agreement with this plan. She also understands that She can call clinic at any time with any questions, concerns, or complaints.   Cancer Staging Small cell lung cancer [facility]) Staging form: Lung, AJCC 8th Edition - Clinical stage from 01/28/2017: Stage IV (cT4, cN3, pM1c) - Signed by Lloyd Huger, MD on 01/28/2017   Jacquelin Hawking, NP   09/13/2017 10:08 AM

## 2017-09-13 NOTE — Telephone Encounter (Signed)
Already done

## 2017-09-14 LAB — THYROID PANEL WITH TSH
Free Thyroxine Index: 2.6 (ref 1.2–4.9)
T3 Uptake Ratio: 28 % (ref 24–39)
T4, Total: 9.3 ug/dL (ref 4.5–12.0)
TSH: 5.07 u[IU]/mL — AB (ref 0.450–4.500)

## 2017-09-26 DIAGNOSIS — I10 Essential (primary) hypertension: Secondary | ICD-10-CM | POA: Diagnosis not present

## 2017-09-26 DIAGNOSIS — R739 Hyperglycemia, unspecified: Secondary | ICD-10-CM | POA: Diagnosis not present

## 2017-09-28 ENCOUNTER — Other Ambulatory Visit: Payer: Self-pay | Admitting: *Deleted

## 2017-09-28 MED ORDER — MORPHINE SULFATE ER 15 MG PO TBCR: 15.0000 mg | EXTENDED_RELEASE_TABLET | Freq: Two times a day (BID) | ORAL | 0 refills | Status: DC

## 2017-09-28 NOTE — Telephone Encounter (Signed)
Patient request morphine refilled. Order Pended for MD approval.

## 2017-09-29 ENCOUNTER — Ambulatory Visit
Admission: RE | Admit: 2017-09-29 | Discharge: 2017-09-29 | Disposition: A | Discharge status: Home / Self Care | Payer: Medicare Other | Source: Ambulatory Visit | Attending: Oncology | Admitting: Oncology

## 2017-09-29 ENCOUNTER — Other Ambulatory Visit (HOSPITAL_COMMUNITY): Payer: Self-pay | Admitting: Oncology

## 2017-09-29 DIAGNOSIS — C7951 Secondary malignant neoplasm of bone: Secondary | ICD-10-CM | POA: Insufficient documentation

## 2017-09-29 DIAGNOSIS — C787 Secondary malignant neoplasm of liver and intrahepatic bile duct: Secondary | ICD-10-CM | POA: Diagnosis not present

## 2017-09-29 DIAGNOSIS — C3490 Malignant neoplasm of unspecified part of unspecified bronchus or lung: Secondary | ICD-10-CM | POA: Diagnosis not present

## 2017-09-29 DIAGNOSIS — K838 Other specified diseases of biliary tract: Secondary | ICD-10-CM | POA: Diagnosis not present

## 2017-09-29 DIAGNOSIS — R918 Other nonspecific abnormal finding of lung field: Secondary | ICD-10-CM | POA: Insufficient documentation

## 2017-09-29 MED ORDER — IOPAMIDOL (ISOVUE-300) INJECTION 61%
100.0000 mL | Freq: Once | INTRAVENOUS | Status: AC | PRN
  Administered 2017-09-29: 100 mL via INTRAVENOUS

## 2017-10-02 NOTE — Progress Notes (Signed)
Cardiology Office Note  Date:  10/03/2017   ID:  Grace [name], DOB [birth year], MRN [id number]  PCP:  [name], MD   Chief Complaint  Patient presents with  . other    follow up from [facility]; hypotenison. Meds reviewed by the pt. verbally. "doing well." Denies dizziness.     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  COPD, active smoker HTN Hyperlipidemia cardiac catheterization, nonischemic cardiopathy, echocardiogram  ejection fraction 35 to 40% in 2016 Stop smoking 2 years ago Who presents for f/u of her nonischemic cardiomyopathy   New diagnosis of lung cancer in the past year Undergoing chemotherapy Metastasis to liver  Recent CT scan performed Several days ago discussed with her Report indicates reduction of bilateral pulmonary nodules in left upper lobe consolidation Still with metastases  Echocardiogram 11/2016, Results discussed Previously 35% to 40 Up to 55-60%  several hospitalizations forvarious issues including acute kidney injury and dehydration Hospital records reviewed with the patient in detail  In a wheelchair SOB, chronic and reports she is unable to qualify for oxygen Weight coming down, down 30 pounds in the past year entresto nd metoprolol were held after recent discharge from the hospital She restarted these on her own  blood pressure running low Difficulty standing or walking secondary to "shortness of breath" She does not check orthostatics Unable to check in the office today as she is reports being unable to stand Chronic left lower extremity weakness   Not on Lasix Insisting on taking her metoprolol, without it she has symptomatically her cardia  In the past make her history reviewed In the ER 05/06/16 for back pain Previous L4-5 lumbar laminectomy on 08/01/16 w/ Dr. Earnie Larsson. [facility]  EKG personally reviewed by myself on todays visit Shows normal sinus rhythm rate 85 bpm no significant ST or  T-wave changes  Previous histories of bronchitis  November 2015, again February 2016 both treated with antibiotics and prednisone.  She stopped smoking 2 years ago   PMH:   has a past medical history of Anxiety, Arthritis, Asthma, Cancer ([place]), CAP (community acquired pneumonia) (06/30/2014), Cardiomyopathy ([place]), CHF (congestive heart failure) ([place]), COPD (chronic obstructive pulmonary disease) ([place]), Depression, Dyspnea, GERD (gastroesophageal reflux disease), Hyperlipidemia, Hypertension, Pneumonia, PVC's (premature ventricular contractions), Sepsis ([place]) (06/30/2014), SOB (shortness of breath) (05/08/2014), and Spinal cord injury at T7-T12 level [facility]).  PSH:    Past Surgical History:  Procedure Laterality Date  . APPENDECTOMY    . CARDIAC CATHETERIZATION    . CATARACT EXTRACTION     right eye   . CESAREAN SECTION    . COLONOSCOPY    . LAMINOTOMY    . LUMBAR LAMINECTOMY/DECOMPRESSION MICRODISCECTOMY Left 08/01/2016   Procedure: Left Lumbar Four-Five Laminectomy and Foraminotomy;  Surgeon: Earnie Larsson, MD;  Location: [place];  Service: Neurosurgery;  Laterality: Left;  . PORTA CATH INSERTION N/A 02/03/2017   Procedure: PORTA CATH INSERTION;  Surgeon: Katha Cabal, MD;  Location: [place] [facility];  Service: Cardiovascular;  Laterality: N/A;    Current Outpatient Medications  Medication Sig Dispense Refill  . ADVAIR DISKUS 250-50 MCG/DOSE AEPB Inhale 1 puff into the lungs 2 (two) times daily.    Marland Kitchen albuterol (PROAIR HFA) 108 (90 Base) MCG/ACT inhaler Inhale 2 puffs into the lungs every 6 (six) hours as needed for wheezing or shortness of breath.     Marland Kitchen alendronate (FOSAMAX) 70 MG tablet Take 70 mg by mouth every Thursday.     Marland Kitchen  ARIPiprazole (ABILIFY) 5 MG tablet Take 5 mg by mouth daily.    . benzonatate (TESSALON) 100 MG capsule Take 1 capsule (100 mg total) by mouth every 8 (eight) hours as needed for cough. 20 capsule 0  . diphenoxylate-atropine (LOMOTIL) 2.5-0.025 MG tablet  Take 1 tablet by mouth 4 (four) times daily as needed for diarrhea or loose stools. 30 tablet 0  . DULoxetine (CYMBALTA) 60 MG capsule Take 60 mg by mouth daily.    Marland Kitchen ipratropium-albuterol (DUONEB) 0.5-2.5 (3) MG/3ML SOLN Take 3 mLs by nebulization every 4 (four) hours as needed. 360 mL 1  . LORazepam (ATIVAN) 0.5 MG tablet Take 0.5 mg by mouth as needed.    . metoCLOPramide (REGLAN) 10 MG tablet Take 1 tablet (10 mg total) by mouth 4 (four) times daily. 60 tablet 2  . morphine (MS CONTIN) 15 MG 12 hr tablet Take 1 tablet (15 mg total) by mouth every 12 (twelve) hours. 30 tablet 0  . pantoprazole (PROTONIX) 40 MG tablet Take 1 tablet by mouth daily.    . potassium chloride SA (K-DUR,KLOR-CON) 20 MEQ tablet Take 1 tablet (20 mEq total) by mouth daily. 30 tablet 2  . prochlorperazine (COMPAZINE) 10 MG tablet Take 1 tablet (10 mg total) by mouth every 6 (six) hours as needed (Nausea or vomiting). 60 tablet 2  . promethazine (PHENERGAN) 25 MG tablet Take 1 tablet (25 mg total) by mouth every 8 (eight) hours as needed for nausea or vomiting. 30 tablet 2  . metoprolol succinate (TOPROL-XL) 50 MG 24 hr tablet Take 1 tablet (50 mg total) by mouth daily. Take with or immediately following a meal. 180 tablet 3  . sacubitril-valsartan (ENTRESTO) 24-26 MG Take 1 tablet by mouth 2 (two) times daily. 180 tablet 3   No current facility-administered medications for this visit.    Facility-Administered Medications Ordered in Other Visits  Medication Dose Route Frequency Provider Last Rate Last Dose  . sodium chloride flush (NS) 0.9 % injection 10 mL  10 mL Intravenous PRN Lloyd Huger, MD   10 mL at 03/01/17 9323     Allergies:   Fish allergy and Fentanyl   Social History:  The patient  reports that she quit smoking about 2 years ago. Her smoking use included cigarettes. She has a 25.00 pack-year smoking history. She has never used smokeless tobacco. She reports that she does not drink alcohol or use  drugs.   Family History:   family history includes Heart attack (age of onset: 87) in her father.    Review of Systems: Review of Systems  Constitutional: Negative.   Respiratory: Positive for shortness of breath.   Cardiovascular: Negative.   Gastrointestinal: Negative.   Musculoskeletal:       Leg weakness, difficulty standing  Neurological: Negative.   Psychiatric/Behavioral: Negative.   All other systems reviewed and are negative.    PHYSICAL EXAM: VS:  BP 100/70 (BP Location: Left Arm, Patient Position: Sitting, Cuff Size: Normal)   Pulse 85   Ht 5\' 5"  (1.651 m)   Wt 202 lb (91.6 kg)   BMI 33.61 kg/m  , BMI Body mass index is 33.61 kg/m. Constitutional:  oriented to person, place, and time. No distress. presenting a wheelchair HENT:  Head: Normocephalic and atraumatic.  Eyes:  no discharge. No scleral icterus.  Neck: Normal range of motion. Neck supple. No JVD present.  Cardiovascular: Normal rate, regular rhythm, normal heart sounds and intact distal pulses. Exam reveals no gallop and no  friction rub. No edema No murmur heard. Pulmonary/Chest: mildly decreased breath sounds throughout, No stridor. No respiratory distress.  no wheezes.  no rales.  no tenderness.  Abdominal: Soft.  no distension.  no tenderness.  Musculoskeletal: Normal range of motion.  no  tenderness or deformity.  Neurological:  normal muscle tone. Coordination normal. No atrophy Skin: Skin is warm and dry. No rash noted. not diaphoretic.  Psychiatric:  normal mood and affect. behavior is normal. Thought content normal.   Recent Labs: 09/13/2017: ALT 18; BUN 14; Creatinine, Ser 1.08; Hemoglobin 10.9; Magnesium 1.8; Platelets 222; Potassium 3.5; Sodium 137; TSH 5.070    Lipid Panel No results found for: CHOL, HDL, LDLCALC, TRIG    Wt Readings from Last 3 Encounters:  10/03/17 202 lb (91.6 kg)  09/13/17 202 lb (91.6 kg)  08/15/17 227 lb 4.8 oz (103.1 kg)      ASSESSMENT AND  PLAN:  Congestive dilated cardiomyopathy ([place]) - Plan: EKG 12-Lead ejection fraction 35-40%,2016 Up to normal ejection fraction October 2018 Blood pressure running low We have recommended she decrease entresto down to 24/26 mg twice a day She would like to stay on metoprolol succinate100 daily Suggested she try 50 twice a day May need to decrease medications further for additional weight loss and any symptoms of orthostasis Recommended she try to set hydrated given low blood pressure  SOB (shortness of breath) - Plan: EKG 12-Lead Appears euvolemic,secondary to COPD and deconditioning Sedentary at baseline secondary to her chronic back issues  COPD (chronic obstructive pulmonary disease) with chronic bronchitis (HCC) -  She stopped smoking 3 years ago Reports her breathing is stable, recommended walking program as legs tolerate  Closed compression fracture of thoracic vertebra, sequela - Plan: EKG 12-Lead Previous surgery Unable to stand secondary to leg weakness  Disposition:   F/U  6 months  Long discussion concerning above with patient and daughter who presents with her today  Total encounter time more than 45 minutes  Greater than 50% was spent in counseling and coordination of care with the patient    Orders Placed This Encounter  Procedures  . EKG 12-Lead     Signed, Esmond Plants, M.D., Ph.D. 10/03/2017  [place], [place]

## 2017-10-03 ENCOUNTER — Ambulatory Visit (INDEPENDENT_AMBULATORY_CARE_PROVIDER_SITE_OTHER): Payer: Medicare Other | Admitting: Cardiovascular Disease

## 2017-10-03 ENCOUNTER — Encounter: Payer: Self-pay | Admitting: Cardiovascular Disease

## 2017-10-03 VITALS — BP 100/70 | HR 85 | Ht 65.0 in | Wt 202.0 lb

## 2017-10-03 DIAGNOSIS — I420 Dilated cardiomyopathy: Secondary | ICD-10-CM | POA: Diagnosis not present

## 2017-10-03 DIAGNOSIS — M5417 Radiculopathy, lumbosacral region: Secondary | ICD-10-CM | POA: Diagnosis not present

## 2017-10-03 DIAGNOSIS — J449 Chronic obstructive pulmonary disease, unspecified: Secondary | ICD-10-CM | POA: Diagnosis not present

## 2017-10-03 DIAGNOSIS — I493 Ventricular premature depolarization: Secondary | ICD-10-CM | POA: Diagnosis not present

## 2017-10-03 DIAGNOSIS — I5023 Acute on chronic systolic (congestive) heart failure: Secondary | ICD-10-CM

## 2017-10-03 DIAGNOSIS — R739 Hyperglycemia, unspecified: Secondary | ICD-10-CM | POA: Diagnosis not present

## 2017-10-03 DIAGNOSIS — M9903 Segmental and somatic dysfunction of lumbar region: Secondary | ICD-10-CM | POA: Diagnosis not present

## 2017-10-03 DIAGNOSIS — J4489 Other specified chronic obstructive pulmonary disease: Secondary | ICD-10-CM

## 2017-10-03 DIAGNOSIS — M50122 Cervical disc disorder at C5-C6 level with radiculopathy: Secondary | ICD-10-CM | POA: Diagnosis not present

## 2017-10-03 DIAGNOSIS — R0602 Shortness of breath: Secondary | ICD-10-CM

## 2017-10-03 DIAGNOSIS — M9901 Segmental and somatic dysfunction of cervical region: Secondary | ICD-10-CM | POA: Diagnosis not present

## 2017-10-03 DIAGNOSIS — C3490 Malignant neoplasm of unspecified part of unspecified bronchus or lung: Secondary | ICD-10-CM

## 2017-10-03 MED ORDER — SACUBITRIL-VALSARTAN 24-26 MG PO TABS: 1.0000 | ORAL_TABLET | Freq: Two times a day (BID) | ORAL | 3 refills | Status: DC

## 2017-10-03 MED ORDER — METOPROLOL SUCCINATE ER 50 MG PO TB24: 50.0000 mg | ORAL_TABLET | Freq: Every day | ORAL | 3 refills | Status: DC

## 2017-10-03 NOTE — Progress Notes (Signed)
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  INTERVAL HISTORY: Patient returns to clinic today for further evaluation and consideration of her next infusion of maintenance Tecentriq.  She currently feels well and is asymptomatic.  Her pain is well controlled on her current narcotic regimen. She has no neurologic complaints.  She denies any recent fevers or illnesses.  She has a good appetite and denies weight loss.  She has chronic shortness of breath, but denies any chest pain or hemoptysis. She has no abdominal pain.  She denies any melena or hematochezia.  She has no urinary complaints.  Patient offers no further specific complaints today.  REVIEW OF SYSTEMS:   Review of Systems  Constitutional: Positive for malaise/fatigue. Negative for fever and weight loss.  Respiratory: Positive for shortness of breath. Negative for cough, hemoptysis and wheezing.   Cardiovascular: Negative.  Negative for chest pain and leg swelling.  Gastrointestinal: Negative.  Negative for abdominal pain, blood in stool, diarrhea, melena, nausea and vomiting.  Genitourinary: Negative.  Negative for dysuria and frequency.  Musculoskeletal: Positive for back pain, joint pain and neck pain.  Skin: Negative.  Negative for rash.  Neurological: Negative.  Negative for sensory change, focal weakness, weakness and headaches.  Psychiatric/Behavioral: Negative.  The patient is not nervous/anxious.     As per HPI. Otherwise, a complete review of systems is negative.  PAST MEDICAL HISTORY: Past Medical History:  Diagnosis Date  . Anxiety   . Arthritis   . Asthma    COPD  . Cancer ()    Liver  . CAP (community acquired  pneumonia) 06/30/2014  . Cardiomyopathy ([place])    nonischemic (EF 35-40%)  . CHF (congestive heart failure) ([place])   . COPD (chronic obstructive pulmonary disease) ([place])   . Depression   . Dyspnea   . GERD (gastroesophageal reflux disease)   . Hyperlipidemia   . Hypertension   . Pneumonia   . PVC's (premature ventricular contractions)    states 10 years ago  . Sepsis ([place]) 06/30/2014  . SOB (shortness of breath) 05/08/2014  . Spinal cord injury at T7-T12 level [facility])    2016    PAST SURGICAL HISTORY: Past Surgical History:  Procedure Laterality Date  . APPENDECTOMY    . CARDIAC CATHETERIZATION    . CATARACT EXTRACTION     right eye   . CESAREAN SECTION    . COLONOSCOPY    . LAMINOTOMY    . LUMBAR LAMINECTOMY/DECOMPRESSION MICRODISCECTOMY Left 08/01/2016   Procedure: Left Lumbar Four-Five Laminectomy and Foraminotomy;  Surgeon: Earnie Larsson, MD;  Location: [place];  Service: Neurosurgery;  Laterality: Left;  . PORTA CATH INSERTION N/A 02/03/2017   Procedure: PORTA CATH INSERTION;  Surgeon: Katha Cabal, MD;  Location: [place] CV LAB;  Service: Cardiovascular;  Laterality: N/A;    FAMILY HISTORY: Family History  Problem Relation Age of Onset  . Heart attack Father 40       MI x 3     ADVANCED DIRECTIVES (Y/N):  N  HEALTH MAINTENANCE: Social History   Tobacco Use  . Smoking status: Former Smoker    Packs/day: 0.50    Years: 50.00    Pack years: 25.00    Types:  Cigarettes    Last attempt to quit: 01/27/2015    Years since quitting: 2.6  . Smokeless tobacco: Never Used  Substance Use Topics  . Alcohol use: No  . Drug use: No     Colonoscopy:  PAP:  Bone density:  Lipid panel:  Allergies  Allergen Reactions  . Fish Allergy Anaphylaxis    Per pt after eating [facility] she had throat swelling and SOB. MSY   . Fentanyl     Overly sedated and groggy, ineffective     Current Outpatient Medications  Medication Sig Dispense Refill  . ADVAIR DISKUS  250-50 MCG/DOSE AEPB Inhale 1 puff into the lungs 2 (two) times daily.    Marland Kitchen alendronate (FOSAMAX) 70 MG tablet Take 70 mg by mouth every Thursday.     . ARIPiprazole (ABILIFY) 5 MG tablet Take 5 mg by mouth daily.    . DULoxetine (CYMBALTA) 60 MG capsule Take 60 mg by mouth daily.    . metoprolol succinate (TOPROL-XL) 50 MG 24 hr tablet Take 1 tablet (50 mg total) by mouth daily. Take with or immediately following a meal. 180 tablet 3  . morphine (MS CONTIN) 15 MG 12 hr tablet Take 1 tablet (15 mg total) by mouth every 8 (eight) hours. 90 tablet 0  . pantoprazole (PROTONIX) 40 MG tablet Take 1 tablet by mouth daily.    . potassium chloride SA (K-DUR,KLOR-CON) 20 MEQ tablet Take 1 tablet (20 mEq total) by mouth daily. 30 tablet 2  . sacubitril-valsartan (ENTRESTO) 24-26 MG Take 1 tablet by mouth 2 (two) times daily. 180 tablet 3  . albuterol (PROAIR HFA) 108 (90 Base) MCG/ACT inhaler Inhale 2 puffs into the lungs every 6 (six) hours as needed for wheezing or shortness of breath.     . benzonatate (TESSALON) 100 MG capsule Take 1 capsule (100 mg total) by mouth every 8 (eight) hours as needed for cough. (Patient not taking: Reported on 10/04/2017) 20 capsule 0  . diphenoxylate-atropine (LOMOTIL) 2.5-0.025 MG tablet Take 1 tablet by mouth 4 (four) times daily as needed for diarrhea or loose stools. (Patient not taking: Reported on 10/04/2017) 30 tablet 0  . ipratropium-albuterol (DUONEB) 0.5-2.5 (3) MG/3ML SOLN Take 3 mLs by nebulization every 4 (four) hours as needed. (Patient not taking: Reported on 10/04/2017) 360 mL 1  . LORazepam (ATIVAN) 0.5 MG tablet Take 0.5 mg by mouth as needed.    . prochlorperazine (COMPAZINE) 10 MG tablet Take 1 tablet (10 mg total) by mouth every 6 (six) hours as needed (Nausea or vomiting). (Patient not taking: Reported on 10/04/2017) 60 tablet 2  . promethazine (PHENERGAN) 25 MG tablet Take 1 tablet (25 mg total) by mouth every 8 (eight) hours as needed for nausea or  vomiting. (Patient not taking: Reported on 10/04/2017) 30 tablet 2   No current facility-administered medications for this visit.    Facility-Administered Medications Ordered in Other Visits  Medication Dose Route Frequency Provider Last Rate Last Dose  . sodium chloride flush (NS) 0.9 % injection 10 mL  10 mL Intravenous PRN Lloyd Huger, MD   10 mL at 03/01/17 0841    OBJECTIVE: Vitals:   10/04/17 1014  BP: 97/68  Pulse: 79  Resp: 18  Temp: (!) 96.5 F (35.8 C)     Body mass index is 33.95 kg/m.    ECOG FS:1 - Symptomatic but completely ambulatory  General: Well-developed, well-nourished, no acute distress.  Sitting in a wheelchair. Eyes: Pink conjunctiva, anicteric sclera. HEENT:  Normocephalic, moist mucous membranes. Lungs: Clear to auscultation bilaterally. Heart: Regular rate and rhythm. No rubs, murmurs, or gallops. Abdomen: Soft, nontender, nondistended. No organomegaly noted, normoactive bowel sounds. Musculoskeletal: No edema, cyanosis, or clubbing. Neuro: Alert, answering all questions appropriately. Cranial nerves grossly intact. Skin: No rashes or petechiae noted. Psych: Normal affect.  LAB RESULTS:  Lab Results  Component Value Date   NA 135 10/04/2017   K 3.6 10/04/2017   CL 108 10/04/2017   CO2 22 10/04/2017   GLUCOSE 122 (H) 10/04/2017   BUN 15 10/04/2017   CREATININE 0.85 10/04/2017   CALCIUM 8.9 10/04/2017   PROT 7.8 10/04/2017   ALBUMIN 3.5 10/04/2017   AST 21 10/04/2017   ALT 15 10/04/2017   ALKPHOS 66 10/04/2017   BILITOT 0.5 10/04/2017   GFRNONAA >60 10/04/2017   GFRAA >60 10/04/2017    Lab Results  Component Value Date   WBC 8.9 10/04/2017   NEUTROABS 6.1 10/04/2017   HGB 11.3 (L) 10/04/2017   HCT 34.2 (L) 10/04/2017   MCV 89.5 10/04/2017   PLT 228 10/04/2017     STUDIES: Ct Chest W Contrast  Result Date: 09/29/2017 CLINICAL DATA:  Stage IV small cell lung cancer with liver and skeletal metastasis. EXAM: CT CHEST,  ABDOMEN, AND PELVIS WITH CONTRAST TECHNIQUE: Multidetector CT imaging of the chest, abdomen and pelvis was performed following the standard protocol during bolus administration of intravenous contrast. CONTRAST:  142mL ISOVUE-300 IOPAMIDOL (ISOVUE-300) INJECTION 61%, 147mL ISOVUE-300 IOPAMIDOL (ISOVUE-300) INJECTION 61% COMPARISON:  CT 08/14/2017, 06/14/2017 FINDINGS: CT CHEST FINDINGS Cardiovascular: Coronary artery calcification and aortic atherosclerotic calcification. Mediastinum/Nodes: No axillary or supraclavicular adenopathy. No mediastinal adenopathy. No pericardial effusion. Esophagus normal Lungs/Pleura: Small nodules remain at site prior larger pleural metastasis. Example 4 mm nodule RIGHT lower lobe at site of prior 18 mm nodule. 4 mm nodule in the RIGHT lower lobe on image 39/3 at site of 21 mm nodule. Band of atelectasis in the LEFT upper lobe at site of dense consolidation. Compared to most recent CT scan 08/14/2017 these lesions have decreased slightly in size. Musculoskeletal: Several subtle sclerotic metastasis in the vertebral bodies are similar prior example image 32/3upper thoracic spine. LEFT rib fracture again noted. CT ABDOMEN AND PELVIS FINDINGS Hepatobiliary: Metastatic lesions in the liver again noted. Lesion inferior LEFT lateral hepatic lobe measures 20 mm by 16 mm compared to 15 mm x 13 mm Lesion along the falciform ligament similar. Lesion in the posterior RIGHT hepatic lobe (segment 7) measures 12 mm x 12 mm (image 52/1) compared to 12 mm x 13 mm. LEFT hepatic lobe capsular bulge just above the falciform ligament measuring 10 mm (image 52/1 appears) new. Common bile duct is mildly dilated 11 mm which is increased from 9 mm. Duct dilatation extends into the common hepatic duct measures 17 mm compared to 15 mm on CT 06/14/2017 Pancreas: . No pancreatic duct dilatation. No pancreatic inflammation. Spleen: Normal spleen Adrenals/urinary tract: Small nodule in the LEFT adrenal gland  measuring 7 mm is not changed from comparison exams. RIGHT adrenal gland normal. Kidneys, ureters and bladder normal. Stomach/Bowel: Stomach, small bowel, appendix, and cecum are normal. The colon and rectosigmoid colon are normal. Vascular/Lymphatic: Abdominal aorta is normal caliber with atherosclerotic calcification. There is no retroperitoneal or periportal lymphadenopathy. No pelvic lymphadenopathy. Reproductive: Uterus and ovaries normal Other: No peritoneal metastasis. Musculoskeletal: Stable smudgy sclerotic lesions throughout the pelvis and spine are not changed. For example round lesion in the LEFT iliac bone measuring 16 mm (image  89/1, unchanged IMPRESSION: Chest Impression: 1. Continued reduction size of bilateral pulmonary nodules and LEFT upper lobe consolidation. 2. No evidence disease progression in the thorax. Abdomen / Pelvis Impression: 1. Overall relatively stable hepatic lesions compared to prior. One new subcapsular nodule in the anterior LEFT hepatic lobe warrants attention on follow-up. 2. Persistent extrahepatic duct dilatation without obstructing lesion identified. Findings similar to 04/04/2017. 3. No lymphadenopathy in the abdomen pelvis. 4. Stable smudgy sclerotic skeletal metastasis. Electronically Signed   By: Suzy Bouchard M.D.   On: 09/29/2017 15:20   Ct Abdomen Pelvis W Contrast  Result Date: 09/29/2017 CLINICAL DATA:  Stage IV small cell lung cancer with liver and skeletal metastasis. EXAM: CT CHEST, ABDOMEN, AND PELVIS WITH CONTRAST TECHNIQUE: Multidetector CT imaging of the chest, abdomen and pelvis was performed following the standard protocol during bolus administration of intravenous contrast. CONTRAST:  138mL ISOVUE-300 IOPAMIDOL (ISOVUE-300) INJECTION 61%, 126mL ISOVUE-300 IOPAMIDOL (ISOVUE-300) INJECTION 61% COMPARISON:  CT 08/14/2017, 06/14/2017 FINDINGS: CT CHEST FINDINGS Cardiovascular: Coronary artery calcification and aortic atherosclerotic calcification.  Mediastinum/Nodes: No axillary or supraclavicular adenopathy. No mediastinal adenopathy. No pericardial effusion. Esophagus normal Lungs/Pleura: Small nodules remain at site prior larger pleural metastasis. Example 4 mm nodule RIGHT lower lobe at site of prior 18 mm nodule. 4 mm nodule in the RIGHT lower lobe on image 39/3 at site of 21 mm nodule. Band of atelectasis in the LEFT upper lobe at site of dense consolidation. Compared to most recent CT scan 08/14/2017 these lesions have decreased slightly in size. Musculoskeletal: Several subtle sclerotic metastasis in the vertebral bodies are similar prior example image 32/3upper thoracic spine. LEFT rib fracture again noted. CT ABDOMEN AND PELVIS FINDINGS Hepatobiliary: Metastatic lesions in the liver again noted. Lesion inferior LEFT lateral hepatic lobe measures 20 mm by 16 mm compared to 15 mm x 13 mm Lesion along the falciform ligament similar. Lesion in the posterior RIGHT hepatic lobe (segment 7) measures 12 mm x 12 mm (image 52/1) compared to 12 mm x 13 mm. LEFT hepatic lobe capsular bulge just above the falciform ligament measuring 10 mm (image 52/1 appears) new. Common bile duct is mildly dilated 11 mm which is increased from 9 mm. Duct dilatation extends into the common hepatic duct measures 17 mm compared to 15 mm on CT 06/14/2017 Pancreas: . No pancreatic duct dilatation. No pancreatic inflammation. Spleen: Normal spleen Adrenals/urinary tract: Small nodule in the LEFT adrenal gland measuring 7 mm is not changed from comparison exams. RIGHT adrenal gland normal. Kidneys, ureters and bladder normal. Stomach/Bowel: Stomach, small bowel, appendix, and cecum are normal. The colon and rectosigmoid colon are normal. Vascular/Lymphatic: Abdominal aorta is normal caliber with atherosclerotic calcification. There is no retroperitoneal or periportal lymphadenopathy. No pelvic lymphadenopathy. Reproductive: Uterus and ovaries normal Other: No peritoneal metastasis.  Musculoskeletal: Stable smudgy sclerotic lesions throughout the pelvis and spine are not changed. For example round lesion in the LEFT iliac bone measuring 16 mm (image 89/1, unchanged IMPRESSION: Chest Impression: 1. Continued reduction size of bilateral pulmonary nodules and LEFT upper lobe consolidation. 2. No evidence disease progression in the thorax. Abdomen / Pelvis Impression: 1. Overall relatively stable hepatic lesions compared to prior. One new subcapsular nodule in the anterior LEFT hepatic lobe warrants attention on follow-up. 2. Persistent extrahepatic duct dilatation without obstructing lesion identified. Findings similar to 04/04/2017. 3. No lymphadenopathy in the abdomen pelvis. 4. Stable smudgy sclerotic skeletal metastasis. Electronically Signed   By: Suzy Bouchard M.D.   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  PLAN:  1. 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  MRI of the brain on February 04, 2017 did not reveal any metastatic disease.  Proceed with her next infusion of maintenance Tecentriq today.  Patient last received Zometa on Jun 21, 2017.  Return to clinic in 3 weeks for continuation of treatment and then in 6 weeks for further evaluation and consideration of cycle 14.   2. Pain: Chronic and unchanged.  Continue 50 mg MS Contin every 12 hours and Percocet as needed. 3.  Anemia: Patient's hemoglobin continues to trend up and is now 11.3.  Monitor.  4.  COPD: Continue outpatient palliative care.  Continue current medications as prescribed. 5.  Disposition: Hospice and end-of-life care were previously discussed, but patient does not wish to pursue this option at this time.  She confirms that she is a DNR and is having  outpatient palliative care visit. 6.  Nausea: Patient does not complain of this today.  Continue Compazine, Phenergan, and Reglan as prescribed.   Patient expressed understanding and was in agreement with this plan. She also understands that She can call clinic at any time with any questions, concerns, or complaints.   Cancer Staging Small cell lung cancer [facility]) Staging form: Lung, AJCC 8th Edition - Clinical stage from 01/28/2017: Stage IV (cT4, cN3, pM1c) - Signed by Lloyd Huger, MD on 01/28/2017   Lloyd Huger, MD   10/06/2017 4:01 PM

## 2017-10-03 NOTE — Patient Instructions (Addendum)
Medication Instructions:   Please cut the metoprolol in 1/2 twice a day Entresto 1/2 pill twice a day Hydration  Labwork:  No new labs needed  Testing/Procedures:  No further testing at this time   Follow-Up: It was a pleasure seeing you in the office today. Please call us if you have new issues that need to be addressed before your next appt.  [phone]  Your physician wants you to follow-up in: 6 months.  You will receive a reminder letter in the mail two months in advance. If you don't receive a letter, please call our office to schedule the follow-up appointment.  If you need a refill on your cardiac medications before your next appointment, please call your pharmacy.  For educational health videos Log in to : [email or web address] Or : [email or web address], password : [email or web address]

## 2017-10-04 ENCOUNTER — Inpatient Hospital Stay: Payer: Medicare Other

## 2017-10-04 ENCOUNTER — Inpatient Hospital Stay (HOSPITAL_BASED_OUTPATIENT_CLINIC_OR_DEPARTMENT_OTHER): Payer: Medicare Other | Admitting: Oncology

## 2017-10-04 ENCOUNTER — Other Ambulatory Visit: Payer: Self-pay

## 2017-10-04 ENCOUNTER — Inpatient Hospital Stay: Payer: Medicare Other | Attending: Oncology

## 2017-10-04 VITALS — BP 97/68 | HR 79 | Temp 96.5°F | Resp 18 | Wt 204.0 lb

## 2017-10-04 DIAGNOSIS — I509 Heart failure, unspecified: Secondary | ICD-10-CM | POA: Diagnosis not present

## 2017-10-04 DIAGNOSIS — I10 Essential (primary) hypertension: Secondary | ICD-10-CM | POA: Diagnosis not present

## 2017-10-04 DIAGNOSIS — I429 Cardiomyopathy, unspecified: Secondary | ICD-10-CM

## 2017-10-04 DIAGNOSIS — Z87891 Personal history of nicotine dependence: Secondary | ICD-10-CM | POA: Insufficient documentation

## 2017-10-04 DIAGNOSIS — K838 Other specified diseases of biliary tract: Secondary | ICD-10-CM

## 2017-10-04 DIAGNOSIS — F418 Other specified anxiety disorders: Secondary | ICD-10-CM | POA: Insufficient documentation

## 2017-10-04 DIAGNOSIS — J449 Chronic obstructive pulmonary disease, unspecified: Secondary | ICD-10-CM | POA: Insufficient documentation

## 2017-10-04 DIAGNOSIS — E785 Hyperlipidemia, unspecified: Secondary | ICD-10-CM | POA: Insufficient documentation

## 2017-10-04 DIAGNOSIS — C7951 Secondary malignant neoplasm of bone: Secondary | ICD-10-CM | POA: Diagnosis not present

## 2017-10-04 DIAGNOSIS — K219 Gastro-esophageal reflux disease without esophagitis: Secondary | ICD-10-CM | POA: Insufficient documentation

## 2017-10-04 DIAGNOSIS — Z79899 Other long term (current) drug therapy: Secondary | ICD-10-CM | POA: Diagnosis not present

## 2017-10-04 DIAGNOSIS — Z5112 Encounter for antineoplastic immunotherapy: Secondary | ICD-10-CM | POA: Diagnosis not present

## 2017-10-04 DIAGNOSIS — C3431 Malignant neoplasm of lower lobe, right bronchus or lung: Secondary | ICD-10-CM | POA: Insufficient documentation

## 2017-10-04 DIAGNOSIS — C3490 Malignant neoplasm of unspecified part of unspecified bronchus or lung: Secondary | ICD-10-CM

## 2017-10-04 DIAGNOSIS — C787 Secondary malignant neoplasm of liver and intrahepatic bile duct: Secondary | ICD-10-CM | POA: Diagnosis not present

## 2017-10-04 LAB — COMPREHENSIVE METABOLIC PANEL
ALT: 15 U/L (ref 0–44)
ANION GAP: 5 (ref 5–15)
AST: 21 U/L (ref 15–41)
Albumin: 3.5 g/dL (ref 3.5–5.0)
Alkaline Phosphatase: 66 U/L (ref 38–126)
BUN: 15 mg/dL (ref 8–23)
CO2: 22 mmol/L (ref 22–32)
Calcium: 8.9 mg/dL (ref 8.9–10.3)
Chloride: 108 mmol/L (ref 98–111)
Creatinine, Ser: 0.85 mg/dL (ref 0.44–1.00)
GFR calc non Af Amer: >60 mL/min (ref >60)
GLUCOSE: 122 mg/dL — AB (ref 70–99)
POTASSIUM: 3.6 mmol/L (ref 3.5–5.1)
SODIUM: 135 mmol/L (ref 135–145)
Total Bilirubin: 0.5 mg/dL (ref 0.3–1.2)
Total Protein: 7.8 g/dL (ref 6.5–8.1)

## 2017-10-04 LAB — CBC WITH DIFFERENTIAL/PLATELET
BASOS PCT: 1 %
Basophils Absolute: 0.1 10*3/uL (ref 0–0.1)
Eosinophils Absolute: 0.5 10*3/uL (ref 0–0.7)
Eosinophils Relative: 5 %
HEMATOCRIT: 34.2 % — AB (ref 35.0–47.0)
Hemoglobin: 11.3 g/dL — ABNORMAL LOW (ref 12.0–16.0)
LYMPHS ABS: 1.7 10*3/uL (ref 1.0–3.6)
LYMPHS PCT: 20 %
MCH: 29.5 pg (ref 26.0–34.0)
MCHC: 33 g/dL (ref 32.0–36.0)
MCV: 89.5 fL (ref 80.0–100.0)
MONO ABS: 0.5 10*3/uL (ref 0.2–0.9)
MONOS PCT: 6 %
NEUTROS ABS: 6.1 10*3/uL (ref 1.4–6.5)
NEUTROS PCT: 68 %
Platelets: 228 10*3/uL (ref 150–440)
RBC: 3.82 MIL/uL (ref 3.80–5.20)
RDW: 14.8 % — AB (ref 11.5–14.5)
WBC: 8.9 10*3/uL (ref 3.6–11.0)

## 2017-10-04 MED ORDER — MORPHINE SULFATE ER 15 MG PO TBCR: 15.0000 mg | EXTENDED_RELEASE_TABLET | Freq: Three times a day (TID) | ORAL | 0 refills | Status: DC

## 2017-10-04 MED ORDER — SODIUM CHLORIDE 0.9% FLUSH
10.0000 mL | Freq: Once | INTRAVENOUS | Status: AC
  Administered 2017-10-04: 10 mL via INTRAVENOUS
  Filled 2017-10-04: qty 10

## 2017-10-04 MED ORDER — SODIUM CHLORIDE 0.9 % IV SOLN
1200.0000 mg | Freq: Once | INTRAVENOUS | Status: AC
  Administered 2017-10-04: 1200 mg via INTRAVENOUS
  Filled 2017-10-04: qty 20

## 2017-10-04 MED ORDER — SODIUM CHLORIDE 0.9 % IV SOLN
Freq: Once | INTRAVENOUS | Status: AC
  Administered 2017-10-04: 11:00:00 via INTRAVENOUS
  Filled 2017-10-04: qty 250

## 2017-10-04 MED ORDER — HEPARIN SOD (PORK) LOCK FLUSH 100 UNIT/ML IV SOLN
500.0000 [IU] | Freq: Once | INTRAVENOUS | Status: AC
  Administered 2017-10-04: 500 [IU] via INTRAVENOUS
  Filled 2017-10-04: qty 5

## 2017-10-04 NOTE — Progress Notes (Signed)
Here for follow up. No nausea / no vomiting per pt. Color good, animated . Went to [employer] w family .

## 2017-10-05 LAB — THYROID PANEL WITH TSH
Free Thyroxine Index: 2.4 (ref 1.2–4.9)
T3 UPTAKE RATIO: 24 % (ref 24–39)
T4, Total: 9.9 ug/dL (ref 4.5–12.0)
TSH: 7.24 u[IU]/mL — ABNORMAL HIGH (ref 0.450–4.500)

## 2017-10-09 DIAGNOSIS — M5417 Radiculopathy, lumbosacral region: Secondary | ICD-10-CM | POA: Diagnosis not present

## 2017-10-09 DIAGNOSIS — M9901 Segmental and somatic dysfunction of cervical region: Secondary | ICD-10-CM | POA: Diagnosis not present

## 2017-10-09 DIAGNOSIS — M9903 Segmental and somatic dysfunction of lumbar region: Secondary | ICD-10-CM | POA: Diagnosis not present

## 2017-10-09 DIAGNOSIS — M50122 Cervical disc disorder at C5-C6 level with radiculopathy: Secondary | ICD-10-CM | POA: Diagnosis not present

## 2017-10-25 ENCOUNTER — Inpatient Hospital Stay: Payer: Medicare Other

## 2017-10-25 ENCOUNTER — Inpatient Hospital Stay: Payer: Medicare Other | Attending: Oncology

## 2017-10-25 VITALS — BP 101/68 | HR 74 | Temp 95.0°F | Resp 18 | Wt 209.0 lb

## 2017-10-25 DIAGNOSIS — Z5111 Encounter for antineoplastic chemotherapy: Secondary | ICD-10-CM | POA: Diagnosis not present

## 2017-10-25 DIAGNOSIS — C7951 Secondary malignant neoplasm of bone: Secondary | ICD-10-CM | POA: Diagnosis not present

## 2017-10-25 DIAGNOSIS — C3490 Malignant neoplasm of unspecified part of unspecified bronchus or lung: Secondary | ICD-10-CM | POA: Diagnosis not present

## 2017-10-25 LAB — COMPREHENSIVE METABOLIC PANEL
ALK PHOS: 56 U/L (ref 38–126)
ALT: 16 U/L (ref 0–44)
AST: 23 U/L (ref 15–41)
Albumin: 3.4 g/dL — ABNORMAL LOW (ref 3.5–5.0)
Anion gap: 9 (ref 5–15)
BUN: 17 mg/dL (ref 8–23)
CALCIUM: 8.9 mg/dL (ref 8.9–10.3)
CO2: 24 mmol/L (ref 22–32)
Chloride: 104 mmol/L (ref 98–111)
Creatinine, Ser: 0.89 mg/dL (ref 0.44–1.00)
GFR calc Af Amer: >60 mL/min (ref >60)
GFR calc non Af Amer: >60 mL/min (ref >60)
Glucose, Bld: 122 mg/dL — ABNORMAL HIGH (ref 70–99)
Potassium: 3.9 mmol/L (ref 3.5–5.1)
SODIUM: 137 mmol/L (ref 135–145)
Total Bilirubin: 0.4 mg/dL (ref 0.3–1.2)
Total Protein: 7.5 g/dL (ref 6.5–8.1)

## 2017-10-25 LAB — CBC WITH DIFFERENTIAL/PLATELET
BASOS ABS: 0 10*3/uL (ref 0–0.1)
BASOS PCT: 1 %
Eosinophils Absolute: 0.2 10*3/uL (ref 0–0.7)
Eosinophils Relative: 3 %
HEMATOCRIT: 33.2 % — AB (ref 35.0–47.0)
HEMOGLOBIN: 10.9 g/dL — AB (ref 12.0–16.0)
Lymphocytes Relative: 23 %
Lymphs Abs: 1.8 10*3/uL (ref 1.0–3.6)
MCH: 29.1 pg (ref 26.0–34.0)
MCHC: 32.7 g/dL (ref 32.0–36.0)
MCV: 88.8 fL (ref 80.0–100.0)
MONOS PCT: 7 %
Monocytes Absolute: 0.5 10*3/uL (ref 0.2–0.9)
NEUTROS PCT: 66 %
Neutro Abs: 5.1 10*3/uL (ref 1.4–6.5)
Platelets: 197 10*3/uL (ref 150–440)
RBC: 3.74 MIL/uL — ABNORMAL LOW (ref 3.80–5.20)
RDW: 14.7 % — ABNORMAL HIGH (ref 11.5–14.5)
WBC: 7.6 10*3/uL (ref 3.6–11.0)

## 2017-10-25 MED ORDER — HEPARIN SOD (PORK) LOCK FLUSH 100 UNIT/ML IV SOLN
500.0000 [IU] | Freq: Once | INTRAVENOUS | Status: AC
  Administered 2017-10-25: 500 [IU] via INTRAVENOUS
  Filled 2017-10-25: qty 5

## 2017-10-25 MED ORDER — SODIUM CHLORIDE 0.9 % IV SOLN
Freq: Once | INTRAVENOUS | Status: AC
Start: 2017-10-25 — End: 2017-10-25
  Administered 2017-10-25: 10:00:00 via INTRAVENOUS
  Filled 2017-10-25: qty 250

## 2017-10-25 MED ORDER — SODIUM CHLORIDE 0.9 % IV SOLN
1200.0000 mg | Freq: Once | INTRAVENOUS | Status: AC
  Administered 2017-10-25: 1200 mg via INTRAVENOUS
  Filled 2017-10-25: qty 20

## 2017-10-25 MED ORDER — SODIUM CHLORIDE 0.9% FLUSH
10.0000 mL | INTRAVENOUS | Status: DC | PRN
  Administered 2017-10-25: 10 mL via INTRAVENOUS
  Filled 2017-10-25: qty 10

## 2017-10-26 LAB — THYROID PANEL WITH TSH
Free Thyroxine Index: 2.5 (ref 1.2–4.9)
T3 Uptake Ratio: 24 % (ref 24–39)
T4, Total: 10.3 ug/dL (ref 4.5–12.0)
TSH: 3.12 u[IU]/mL (ref 0.450–4.500)

## 2017-11-02 ENCOUNTER — Telehealth: Payer: Self-pay | Admitting: Pain Medicine

## 2017-11-02 NOTE — Telephone Encounter (Signed)
Patient lvmail stating she would like to have procedure. Last visit was follow up in June. Spoke with patient today and she is having bilateral knee pain, would like to get injections in both knees.

## 2017-11-02 NOTE — Telephone Encounter (Signed)
Patient has prn for bilateral Hyalgan knee injections.  I will release the order.  Please schedule without sedation.

## 2017-11-07 ENCOUNTER — Encounter: Payer: Self-pay | Admitting: Pain Medicine

## 2017-11-07 ENCOUNTER — Telehealth: Payer: Self-pay | Admitting: *Deleted

## 2017-11-07 ENCOUNTER — Other Ambulatory Visit: Payer: Self-pay | Admitting: Oncology

## 2017-11-07 ENCOUNTER — Ambulatory Visit: Payer: Medicare Other | Attending: Pain Medicine | Admitting: Pain Medicine

## 2017-11-07 ENCOUNTER — Other Ambulatory Visit: Payer: Self-pay

## 2017-11-07 VITALS — BP 124/93 | HR 131 | Temp 97.5°F | Resp 18 | Ht 65.0 in | Wt 204.0 lb

## 2017-11-07 DIAGNOSIS — J449 Chronic obstructive pulmonary disease, unspecified: Secondary | ICD-10-CM | POA: Insufficient documentation

## 2017-11-07 DIAGNOSIS — M25561 Pain in right knee: Secondary | ICD-10-CM | POA: Insufficient documentation

## 2017-11-07 DIAGNOSIS — G8929 Other chronic pain: Secondary | ICD-10-CM | POA: Diagnosis not present

## 2017-11-07 DIAGNOSIS — Z79899 Other long term (current) drug therapy: Secondary | ICD-10-CM | POA: Insufficient documentation

## 2017-11-07 DIAGNOSIS — M25512 Pain in left shoulder: Secondary | ICD-10-CM

## 2017-11-07 DIAGNOSIS — M25562 Pain in left knee: Secondary | ICD-10-CM | POA: Insufficient documentation

## 2017-11-07 DIAGNOSIS — Z91013 Allergy to seafood: Secondary | ICD-10-CM | POA: Diagnosis not present

## 2017-11-07 DIAGNOSIS — Z885 Allergy status to narcotic agent status: Secondary | ICD-10-CM | POA: Insufficient documentation

## 2017-11-07 DIAGNOSIS — Z7983 Long term (current) use of bisphosphonates: Secondary | ICD-10-CM | POA: Diagnosis not present

## 2017-11-07 DIAGNOSIS — Z7951 Long term (current) use of inhaled steroids: Secondary | ICD-10-CM | POA: Insufficient documentation

## 2017-11-07 DIAGNOSIS — M19012 Primary osteoarthritis, left shoulder: Secondary | ICD-10-CM | POA: Insufficient documentation

## 2017-11-07 DIAGNOSIS — Z79891 Long term (current) use of opiate analgesic: Secondary | ICD-10-CM | POA: Diagnosis not present

## 2017-11-07 DIAGNOSIS — M25511 Pain in right shoulder: Secondary | ICD-10-CM

## 2017-11-07 DIAGNOSIS — M170 Bilateral primary osteoarthritis of knee: Secondary | ICD-10-CM | POA: Diagnosis not present

## 2017-11-07 DIAGNOSIS — M19011 Primary osteoarthritis, right shoulder: Secondary | ICD-10-CM

## 2017-11-07 MED ORDER — ROPIVACAINE HCL 2 MG/ML IJ SOLN
5.0000 mL | Freq: Once | INTRAMUSCULAR | Status: AC
  Administered 2017-11-07: 10 mL via INTRA_ARTICULAR
  Filled 2017-11-07: qty 10

## 2017-11-07 MED ORDER — MORPHINE SULFATE ER 15 MG PO TBCR: 15.0000 mg | EXTENDED_RELEASE_TABLET | Freq: Three times a day (TID) | ORAL | 0 refills | Status: DC

## 2017-11-07 MED ORDER — LIDOCAINE HCL (PF) 1 % IJ SOLN
10.0000 mL | Freq: Once | INTRAMUSCULAR | Status: AC
  Administered 2017-11-07: 5 mL
  Filled 2017-11-07: qty 10

## 2017-11-07 MED ORDER — METHYLPREDNISOLONE ACETATE 80 MG/ML IJ SUSP
80.0000 mg | Freq: Once | INTRAMUSCULAR | Status: AC
  Administered 2017-11-07: 80 mg via INTRA_ARTICULAR
  Filled 2017-11-07: qty 1

## 2017-11-07 NOTE — Progress Notes (Signed)
Patient's Name: Grace [name]  MRN: [id number]  Referring Provider: [name], MD  DOB: Aug 08, 1950  PCP: [name], MD  DOS: 11/07/2017  Note by: Gaspar Cola, MD  Service setting: Ambulatory outpatient  Specialty: Interventional Pain Management  Patient type: Established  Location: [facility] (AMB) Pain Management Facility  Visit type: Interventional Procedure   Primary Reason for Visit: Interventional Pain Management Treatment. CC: Knee Pain (bilateral)  Procedure:          Anesthesia, Analgesia, Anxiolysis:  Type: Diagnostic Intra-Articular Local anesthetic and steroid Knee Injection #1  Region: Lateral infrapatellar Knee Region Level: Knee Joint Laterality: Bilateral  Type: Local Anesthesia Indication(s): Analgesia         Local Anesthetic: Lidocaine 1-2% Route: Infiltration (Big Pool/IM) IV Access: Declined Sedation: Declined    Indications: 1. Osteoarthritis of knees (Bilateral)   2. Chronic knee pain (Primary Area of Pain) (Bilateral)    Pain Score: Pre-procedure: 5 /10 Post-procedure: 0-No pain/10  Pre-op Assessment:  Grace [name] is a 67 y.o. (year old), female patient, seen today for interventional treatment. She  has a past surgical history that includes Cardiac catheterization; Cesarean section; Appendectomy; Cataract extraction; Colonoscopy; Lumbar laminectomy/decompression microdiscectomy (Left, 08/01/2016); Laminotomy; and PORTA CATH INSERTION (N/A, 02/03/2017). Grace [name] has a current medication list which includes the following prescription(s): advair diskus, albuterol, alendronate, aripiprazole, duloxetine, lorazepam, metoprolol succinate, morphine, pantoprazole, potassium chloride sa, sacubitril-valsartan, benzonatate, diphenoxylate-atropine, ipratropium-albuterol, prochlorperazine, and promethazine, and the following Facility-Administered Medications: sodium chloride flush. Her primarily concern today is the Knee Pain (bilateral)  Initial Vital Signs:   Pulse/HCG Rate: (!) 131  Temp: (!) 97.5 F (36.4 C) Resp: 18 BP: (!) 124/93 SpO2: 97 %  BMI: Estimated body mass index is 33.95 kg/m as calculated from the following:   Height as of this encounter: 5\' 5"  (1.651 m).   Weight as of this encounter: 204 lb (92.5 kg).  Risk Assessment: Allergies: Reviewed. She is allergic to fish allergy and fentanyl.  Allergy Precautions: None required Coagulopathies: Reviewed. None identified.  Blood-thinner therapy: None at this time Active Infection(s): Reviewed. None identified. Grace [name] is afebrile  Site Confirmation: Grace [name] was asked to confirm the procedure and laterality before marking the site Procedure checklist: Completed Consent: Before the procedure and under the influence of no sedative(s), amnesic(s), or anxiolytics, the patient was informed of the treatment options, risks and possible complications. To fulfill our ethical and legal obligations, as recommended by the American Medical Association's Code of Ethics, I have informed the patient of my clinical impression; the nature and purpose of the treatment or procedure; the risks, benefits, and possible complications of the intervention; the alternatives, including doing nothing; the risk(s) and benefit(s) of the alternative treatment(s) or procedure(s); and the risk(s) and benefit(s) of doing nothing. The patient was provided information about the general risks and possible complications associated with the procedure. These may include, but are not limited to: failure to achieve desired goals, infection, bleeding, organ or nerve damage, allergic reactions, paralysis, and death. In addition, the patient was informed of those risks and complications associated to the procedure, such as failure to decrease pain; infection; bleeding; organ or nerve damage with subsequent damage to sensory, motor, and/or autonomic systems, resulting in permanent pain, numbness, and/or weakness of one or  several areas of the body; allergic reactions; (i.e.: anaphylactic reaction); and/or death. Furthermore, the patient was informed of those risks and complications associated with the medications. These include, but are not limited to: allergic reactions (i.e.: anaphylactic or  anaphylactoid reaction(s)); adrenal axis suppression; blood sugar elevation that in diabetics may result in ketoacidosis or comma; water retention that in patients with history of congestive heart failure may result in shortness of breath, pulmonary edema, and decompensation with resultant heart failure; weight gain; swelling or edema; medication-induced neural toxicity; particulate matter embolism and blood vessel occlusion with resultant organ, and/or nervous system infarction; and/or aseptic necrosis of one or more joints. Finally, the patient was informed that Medicine is not an exact science; therefore, there is also the possibility of unforeseen or unpredictable risks and/or possible complications that may result in a catastrophic outcome. The patient indicated having understood very clearly. We have given the patient no guarantees and we have made no promises. Enough time was given to the patient to ask questions, all of which were answered to the patient's satisfaction. Grace [name] has indicated that she wanted to continue with the procedure. Attestation: I, the ordering provider, attest that I have discussed with the patient the benefits, risks, side-effects, alternatives, likelihood of achieving goals, and potential problems during recovery for the procedure that I have provided informed consent. Date  Time: 11/07/2017  1:32 PM  Pre-Procedure Preparation:  Monitoring: As per clinic protocol. Respiration, ETCO2, SpO2, BP, heart rate and rhythm monitor placed and checked for adequate function Safety Precautions: Patient was assessed for positional comfort and pressure points before starting the procedure. Time-out: I initiated  and conducted the "Time-out" before starting the procedure, as per protocol. The patient was asked to participate by confirming the accuracy of the "Time Out" information. Verification of the correct person, site, and procedure were performed and confirmed by me, the nursing staff, and the patient. "Time-out" conducted as per Joint Commission's Universal Protocol (UP.01.01.01). Time: 1358  Description of Procedure:          Position: Sitting Target Area: Knee Joint Approach: Just above the Lateral tibial plateau, lateral to the infrapatellar tendon. Area Prepped: Entire knee area, from the mid-thigh to the mid-shin. Prepping solution: ChloraPrep (2% chlorhexidine gluconate and 70% isopropyl alcohol) Safety Precautions: Aspiration looking for blood return was conducted prior to all injections. At no point did we inject any substances, as a needle was being advanced. No attempts were made at seeking any paresthesias. Safe injection practices and needle disposal techniques used. Medications properly checked for expiration dates. SDV (single dose vial) medications used. Description of the Procedure: Protocol guidelines were followed. The patient was placed in position over the fluoroscopy table. The target area was identified and the area prepped in the usual manner. Skin & deeper tissues infiltrated with local anesthetic. Appropriate amount of time allowed to pass for local anesthetics to take effect. The procedure needles were then advanced to the target area. Proper needle placement secured. Negative aspiration confirmed. Solution injected in intermittent fashion, asking for systemic symptoms every 0.5cc of injectate. The needles were then removed and the area cleansed, making sure to leave some of the prepping solution back to take advantage of its long term bactericidal properties. Vitals:   11/07/17 1330  BP: (!) 124/93  Pulse: (!) 131  Resp: 18  Temp: (!) 97.5 F (36.4 C)  TempSrc: Oral  SpO2:  97%  Weight: 204 lb (92.5 kg)  Height: 5\' 5"  (1.651 m)    Start Time: 1358 hrs. End Time: 1400 hrs. Materials:  Needle(s) Type: Regular needle Gauge: 25G Length: 1.5-in Medication(s): Please see orders for medications and dosing details. Imaging Guidance:          Type  of Imaging Technique: None used Indication(s): N/A Exposure Time: No patient exposure Contrast: None used. Fluoroscopic Guidance: N/A Ultrasound Guidance: N/A Interpretation: N/A  Antibiotic Prophylaxis:   Anti-infectives (From admission, onward)   None     Indication(s): None identified  Post-operative Assessment:  Post-procedure Vital Signs:  Pulse/HCG Rate: (!) 131  Temp: (!) 97.5 F (36.4 C) Resp: 18 BP: (!) 124/93 SpO2: 97 %  EBL: None  Complications: No immediate post-treatment complications observed by team, or reported by patient.  Note: The patient tolerated the entire procedure well. A repeat set of vitals were taken after the procedure and the patient was kept under observation following institutional policy, for this type of procedure. Post-procedural neurological assessment was performed, showing return to baseline, prior to discharge. The patient was provided with post-procedure discharge instructions, including a section on how to identify potential problems. Should any problems arise concerning this procedure, the patient was given instructions to immediately contact us, at any time, without hesitation. In any case, we plan to contact the patient by telephone for a follow-up status report regarding this interventional procedure.  Comments:  No additional relevant information.  Plan of Care   Possible POC:  Return in approximately 2 weeks for diagnostic/therapeutic bilateral intra-articular shoulder joint injection #1 under fluoroscopic guidance and IV sedation. In the past we have done a right acromioclavicular joint injection and a right suprascapular nerve block.  The patient did not  keep her follow-up appointment after the diagnostic suprascapular nerve block and therefore we do not have good information asked to how she responded to this diagnostic injection.  The intent was to determine if this was an appropriate way of controlling her shoulder pain so asked to consider the possibility of radiofrequency ablation for longer lasting results.   Imaging Orders  No imaging studies ordered today    Procedure Orders     KNEE INJECTION     SHOULDER INJECTION  Medications ordered for procedure: Meds ordered this encounter  Medications  . lidocaine (PF) (XYLOCAINE) 1 % injection 10 mL  . ropivacaine (PF) 2 mg/mL (0.2%) (NAROPIN) injection 5 mL  . methylPREDNISolone acetate (DEPO-MEDROL) injection 80 mg   Medications administered: We administered lidocaine (PF), ropivacaine (PF) 2 mg/mL (0.2%), and methylPREDNISolone acetate.  See the medical record for exact dosing, route, and time of administration.  New Prescriptions   No medications on file   Disposition: Discharge home  Discharge Date & Time: 11/07/2017; 1410 hrs.   Physician-requested Follow-up: Return for PPE (2 wks) + Procedure (no sedation): (B) Shoulder inj..  Future Appointments  Date Time Provider [place]  11/15/2017  9:30 AM [facility] FLUSH [facility] None  11/15/2017  9:45 AM Lloyd Huger, MD [facility] None  11/15/2017 10:00 AM [facility] INFUSION CHAIR 5 [facility] None  11/27/2017  1:00 PM Milinda Pointer, MD [facility] None   Primary Care Physician: [name], MD Location: [facility] Note by: Gaspar Cola, MD Date: 11/07/2017; Time: 2:06 PM  Disclaimer:  Medicine is not an Chief Strategy Officer. The only guarantee in medicine is that nothing is guaranteed. It is important to note that the decision to proceed with this intervention was based on the information collected from the patient. The Data and conclusions were drawn from the patient's  questionnaire, the interview, and the physical examination. Because the information was provided in large part by the patient, it cannot be guaranteed that it has not been purposely or unconsciously manipulated. Every effort has been made  to obtain as much relevant data as possible for this evaluation. It is important to note that the conclusions that lead to this procedure are derived in large part from the available data. Always take into account that the treatment will also be dependent on availability of resources and existing treatment guidelines, considered by other Pain Management Practitioners as being common knowledge and practice, at the time of the intervention. For Medico-Legal purposes, it is also important to point out that variation in procedural techniques and pharmacological choices are the acceptable norm. The indications, contraindications, technique, and results of the above procedure should only be interpreted and judged by a Board-Certified Interventional Pain Specialist with extensive familiarity and expertise in the same exact procedure and technique.

## 2017-11-07 NOTE — Telephone Encounter (Signed)
Patient requests refill for MS Contin.

## 2017-11-07 NOTE — Progress Notes (Signed)
Patient called cancer center requesting refill of MS Contin 15 mg q 12 hours.  [place] Controlled Substance Reporting System reviewed and refill is appropriate on or after 11/06/17. Medication e-scribed to her pharmacy ([facility]) using [employer] 2-step verification process.    [place] reviewed:     Faythe Casa, NP 11/07/2017 3:49 PM [phone]

## 2017-11-07 NOTE — Patient Instructions (Signed)
____________________________________________________________________________________________  Post-Procedure Discharge Instructions  Instructions:  Apply ice: Fill a plastic sandwich bag with crushed ice. Cover it with a small towel and apply to injection site. Apply for 15 minutes then remove x 15 minutes. Repeat sequence on day of procedure, until you go to bed. The purpose is to minimize swelling and discomfort after procedure.  Apply heat: Apply heat to procedure site starting the day following the procedure. The purpose is to treat any soreness and discomfort from the procedure.  Food intake: Start with clear liquids (like water) and advance to regular food, as tolerated.   Physical activities: Keep activities to a minimum for the first 8 hours after the procedure.   Driving: If you have received any sedation, you are not allowed to drive for 24 hours after your procedure.  Blood thinner: Restart your blood thinner 6 hours after your procedure. (Only for those taking blood thinners)  Insulin: As soon as you can eat, you may resume your normal dosing schedule. (Only for those taking insulin)  Infection prevention: Keep procedure site clean and dry.  Post-procedure Pain Diary: Extremely important that this be done correctly and accurately. Recorded information will be used to determine the next step in treatment.  Pain evaluated is that of treated area only. Do not include pain from an untreated area.  Complete every hour, on the hour, for the initial 8 hours. Set an alarm to help you do this part accurately.  Do not go to sleep and have it completed later. It will not be accurate.  Follow-up appointment: Keep your follow-up appointment after the procedure. Usually 2 weeks for most procedures. (6 weeks in the case of radiofrequency.) Bring you pain diary.   Expect:  From numbing medicine (AKA: Local Anesthetics): Numbness or decrease in pain.  Onset: Full effect within 15  minutes of injected.  Duration: It will depend on the type of local anesthetic used. On the average, 1 to 8 hours.   From steroids: Decrease in swelling or inflammation. Once inflammation is improved, relief of the pain will follow.  Onset of benefits: Depends on the amount of swelling present. The more swelling, the longer it will take for the benefits to be seen. In some cases, up to 10 days.  Duration: Steroids will stay in the system x 2 weeks. Duration of benefits will depend on multiple posibilities including persistent irritating factors.  Occasional side-effects: Facial flushing, cramps (if present, drink Gatorade and take over-the-counter Magnesium 450-500 mg once to twice a day).  From procedure: Some discomfort is to be expected once the numbing medicine wears off. This should be minimal if ice and heat are applied as instructed.  Call if:  You experience numbness and weakness that gets worse with time, as opposed to wearing off.  New onset bowel or bladder incontinence. (This applies to Spinal procedures only)  Emergency Numbers:  Durning business hours (Monday - Thursday, 8:00 AM - 4:00 PM) (Friday, 9:00 AM - 12:00 Noon): [phone]  After hours: [phone] ____________________________________________________________________________________________   ____________________________________________________________________________________________  Preparing for your procedure (without sedation)  Instructions: . Oral Intake: Do not eat or drink anything for at least 3 hours prior to your procedure. . Transportation: Unless otherwise stated by your physician, you may drive yourself after the procedure. . Blood Pressure Medicine: Take your blood pressure medicine with a sip of water the morning of the procedure. . Blood thinners: Notify our staff if you are taking any blood thinners. Depending on which  one you take, there will be specific instructions on how and when  to stop it. . Diabetics on insulin: Notify the staff so that you can be scheduled 1st case in the morning. If your diabetes requires high dose insulin, take only  of your normal insulin dose the morning of the procedure and notify the staff that you have done so. . Preventing infections: Shower with an antibacterial soap the morning of your procedure.  . Build-up your immune system: Take 1000 mg of Vitamin C with every meal (3 times a day) the day prior to your procedure. Marland Kitchen Antibiotics: Inform the staff if you have a condition or reason that requires you to take antibiotics before dental procedures. . Pregnancy: If you are pregnant, call and cancel the procedure. . Sickness: If you have a cold, fever, or any active infections, call and cancel the procedure. . Arrival: You must be in the facility at least 30 minutes prior to your scheduled procedure. . Children: Do not bring any children with you. . Dress appropriately: Bring dark clothing that you would not mind if they get stained. . Valuables: Do not bring any jewelry or valuables.  Procedure appointments are reserved for interventional treatments only. Marland Kitchen No Prescription Refills. . No medication changes will be discussed during procedure appointments. . No disability issues will be discussed.  Reasons to call and reschedule or cancel your procedure: (Following these recommendations will minimize the risk of a serious complication.) . Surgeries: Avoid having procedures within 2 weeks of any surgery. (Avoid for 2 weeks before or after any surgery). . Flu Shots: Avoid having procedures within 2 weeks of a flu shots or . (Avoid for 2 weeks before or after immunizations). . Barium: Avoid having a procedure within 7-10 days after having had a radiological study involving the use of radiological contrast. (Myelograms, Barium swallow or enema study). . Heart attacks: Avoid any elective procedures or surgeries for the initial 6 months after a  "Myocardial Infarction" (Heart Attack). . Blood thinners: It is imperative that you stop these medications before procedures. Let us know if you if you take any blood thinner.  . Infection: Avoid procedures during or within two weeks of an infection (including chest colds or gastrointestinal problems). Symptoms associated with infections include: Localized redness, fever, chills, night sweats or profuse sweating, burning sensation when voiding, cough, congestion, stuffiness, runny nose, sore throat, diarrhea, nausea, vomiting, cold or Flu symptoms, recent or current infections. It is specially important if the infection is over the area that we intend to treat. Marland Kitchen Heart and lung problems: Symptoms that may suggest an active cardiopulmonary problem include: cough, chest pain, breathing difficulties or shortness of breath, dizziness, ankle swelling, uncontrolled high or unusually low blood pressure, and/or palpitations. If you are experiencing any of these symptoms, cancel your procedure and contact your primary care physician for an evaluation.  Remember:  Regular Business hours are:  Monday to Thursday 8:00 AM to 4:00 PM  Provider's Schedule: Milinda Pointer, MD:  Procedure days: Tuesday and Thursday 7:30 AM to 4:00 PM  Gillis Santa, MD:  Procedure days: Monday and Wednesday 7:30 AM to 4:00 PM ____________________________________________________________________________________________

## 2017-11-08 ENCOUNTER — Telehealth: Payer: Self-pay | Admitting: *Deleted

## 2017-11-08 DIAGNOSIS — M546 Pain in thoracic spine: Secondary | ICD-10-CM | POA: Diagnosis not present

## 2017-11-08 DIAGNOSIS — M9903 Segmental and somatic dysfunction of lumbar region: Secondary | ICD-10-CM | POA: Diagnosis not present

## 2017-11-08 DIAGNOSIS — M5417 Radiculopathy, lumbosacral region: Secondary | ICD-10-CM | POA: Diagnosis not present

## 2017-11-08 DIAGNOSIS — M9901 Segmental and somatic dysfunction of cervical region: Secondary | ICD-10-CM | POA: Diagnosis not present

## 2017-11-08 DIAGNOSIS — G8929 Other chronic pain: Secondary | ICD-10-CM | POA: Diagnosis not present

## 2017-11-08 DIAGNOSIS — M50122 Cervical disc disorder at C5-C6 level with radiculopathy: Secondary | ICD-10-CM | POA: Diagnosis not present

## 2017-11-08 NOTE — Telephone Encounter (Signed)
Voicemail left for patient to call our office if there are questions or concerns re; procedure.

## 2017-11-11 ENCOUNTER — Other Ambulatory Visit: Payer: Self-pay | Admitting: Oncology

## 2017-11-13 NOTE — Progress Notes (Signed)
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  INTERVAL HISTORY: Patient returns to clinic today for further evaluation and consideration of her next treatment of maintenance Tecentriq.  She has chronic weakness and fatigue, but otherwise feels well.  She is tolerating her treatments without significant side effects. Her pain is well controlled on her current narcotic regimen. She has no neurologic complaints.  She denies any recent fevers or illnesses.  She has a good appetite and denies weight loss.  She has chronic shortness of breath, but denies any chest pain or hemoptysis. She has no abdominal pain.  She denies any melena or hematochezia.  She has no urinary complaints.  Patient offers no further specific complaints today.  REVIEW OF SYSTEMS:   Review of Systems  Constitutional: Positive for malaise/fatigue. Negative for fever and weight loss.  Respiratory: Positive for shortness of breath. Negative for cough, hemoptysis and wheezing.   Cardiovascular: Negative.  Negative for chest pain and leg swelling.  Gastrointestinal: Negative.  Negative for abdominal pain, blood in stool, diarrhea, melena, nausea and vomiting.  Genitourinary: Negative.  Negative for dysuria and frequency.  Musculoskeletal: Positive for back pain, joint pain and neck pain.  Skin: Negative.  Negative for rash.  Neurological: Positive for weakness. Negative for sensory change, focal weakness and headaches.  Psychiatric/Behavioral: Negative.  The patient is not nervous/anxious.     As per HPI. Otherwise, a complete review of systems is negative.  PAST MEDICAL HISTORY: Past Medical History:  Diagnosis Date  . Anxiety     . Arthritis   . Asthma    COPD  . Cancer ([place])    Liver  . CAP (community acquired pneumonia) 06/30/2014  . Cardiomyopathy ([place])    nonischemic (EF 35-40%)  . CHF (congestive heart failure) ([place])   . COPD (chronic obstructive pulmonary disease) ([place])   . Depression   . Dyspnea   . GERD (gastroesophageal reflux disease)   . Hyperlipidemia   . Hypertension   . Pneumonia   . PVC's (premature ventricular contractions)    states 10 years ago  . Sepsis ([place]) 06/30/2014  . SOB (shortness of breath) 05/08/2014  . Spinal cord injury at T7-T12 level [facility])    2016    PAST SURGICAL HISTORY: Past Surgical History:  Procedure Laterality Date  . APPENDECTOMY    . CARDIAC CATHETERIZATION    . CATARACT EXTRACTION     right eye   . CESAREAN SECTION    . COLONOSCOPY    . LAMINOTOMY    . LUMBAR LAMINECTOMY/DECOMPRESSION MICRODISCECTOMY Left 08/01/2016   Procedure: Left Lumbar Four-Five Laminectomy and Foraminotomy;  Surgeon: Earnie Larsson, MD;  Location: [place];  Service: Neurosurgery;  Laterality: Left;  . PORTA CATH INSERTION N/A 02/03/2017   Procedure: PORTA CATH INSERTION;  Surgeon: Katha Cabal, MD;  Location: [place] [facility];  Service: Cardiovascular;  Laterality: N/A;    FAMILY HISTORY: Family History  Problem Relation Age of Onset  . Heart attack Father 24       MI x 3     ADVANCED DIRECTIVES (Y/N):  N  HEALTH MAINTENANCE: Social History   Tobacco Use  . Smoking status: Former Smoker    Packs/day: 0.50  Years: 50.00    Pack years: 25.00    Types: Cigarettes    Last attempt to quit: 01/27/2015    Years since quitting: 2.8  . Smokeless tobacco: Never Used  Substance Use Topics  . Alcohol use: No  . Drug use: No     Colonoscopy:  PAP:  Bone density:  Lipid panel:  Allergies  Allergen Reactions  . Fish Allergy Anaphylaxis    Per pt after eating [facility] she had throat swelling and SOB. MSY   . Fentanyl     Overly sedated and groggy, ineffective      Current Outpatient Medications  Medication Sig Dispense Refill  . ADVAIR DISKUS 250-50 MCG/DOSE AEPB Inhale 1 puff into the lungs 2 (two) times daily.    Marland Kitchen albuterol (PROAIR HFA) 108 (90 Base) MCG/ACT inhaler Inhale 2 puffs into the lungs every 6 (six) hours as needed for wheezing or shortness of breath.     Marland Kitchen alendronate (FOSAMAX) 70 MG tablet Take 70 mg by mouth every Thursday.     . ARIPiprazole (ABILIFY) 5 MG tablet Take 5 mg by mouth daily.    . DULoxetine (CYMBALTA) 60 MG capsule Take 60 mg by mouth daily.    . metoprolol succinate (TOPROL-XL) 50 MG 24 hr tablet Take 1 tablet (50 mg total) by mouth daily. Take with or immediately following a meal. 180 tablet 3  . morphine (MS CONTIN) 15 MG 12 hr tablet Take 1 tablet (15 mg total) by mouth every 8 (eight) hours. 90 tablet 0  . pantoprazole (PROTONIX) 40 MG tablet Take 1 tablet by mouth daily.    . potassium chloride SA (K-DUR,KLOR-CON) 20 MEQ tablet Take 1 tablet (20 mEq total) by mouth daily. 30 tablet 2  . sacubitril-valsartan (ENTRESTO) 24-26 MG Take 1 tablet by mouth 2 (two) times daily. 180 tablet 3  . benzonatate (TESSALON) 100 MG capsule Take 1 capsule (100 mg total) by mouth every 8 (eight) hours as needed for cough. (Patient not taking: Reported on 11/15/2017) 20 capsule 0  . diphenoxylate-atropine (LOMOTIL) 2.5-0.025 MG tablet Take 1 tablet by mouth 4 (four) times daily as needed for diarrhea or loose stools. (Patient not taking: Reported on 11/15/2017) 30 tablet 0  . ipratropium-albuterol (DUONEB) 0.5-2.5 (3) MG/3ML SOLN Take 3 mLs by nebulization every 4 (four) hours as needed. (Patient not taking: Reported on 11/15/2017) 360 mL 1  . LORazepam (ATIVAN) 0.5 MG tablet Take 0.5 mg by mouth as needed.    . prochlorperazine (COMPAZINE) 10 MG tablet Take 1 tablet (10 mg total) by mouth every 6 (six) hours as needed (Nausea or vomiting). (Patient not taking: Reported on 11/15/2017) 60 tablet 2  . promethazine (PHENERGAN) 25 MG tablet  Take 1 tablet (25 mg total) by mouth every 8 (eight) hours as needed for nausea or vomiting. (Patient not taking: Reported on 11/15/2017) 30 tablet 2   No current facility-administered medications for this visit.    Facility-Administered Medications Ordered in Other Visits  Medication Dose Route Frequency Provider Last Rate Last Dose  . sodium chloride flush (NS) 0.9 % injection 10 mL  10 mL Intravenous PRN Lloyd Huger, MD   10 mL at 03/01/17 0841    OBJECTIVE: Vitals:   11/15/17 1039  BP: 106/71  Pulse: (!) 103  Resp: 18  Temp: (!) 94.8 F (34.9 C)     Body mass index is 33.95 kg/m.    ECOG FS:1 - Symptomatic but completely ambulatory  General: Well-developed, well-nourished, no  acute distress.  Sitting in a wheelchair. Eyes: Pink conjunctiva, anicteric sclera. HEENT: Normocephalic, moist mucous membranes. Lungs: Clear to auscultation bilaterally. Heart: Regular rate and rhythm. No rubs, murmurs, or gallops. Abdomen: Soft, nontender, nondistended. No organomegaly noted, normoactive bowel sounds. Musculoskeletal: No edema, cyanosis, or clubbing. Neuro: Alert, answering all questions appropriately. Cranial nerves grossly intact. Skin: No rashes or petechiae noted. Psych: Normal affect.  LAB RESULTS:  Lab Results  Component Value Date   NA 136 11/15/2017   K 3.7 11/15/2017   CL 104 11/15/2017   CO2 24 11/15/2017   GLUCOSE 108 (H) 11/15/2017   BUN 17 11/15/2017   CREATININE 0.81 11/15/2017   CALCIUM 8.9 11/15/2017   PROT 7.8 11/15/2017   ALBUMIN 3.5 11/15/2017   AST 25 11/15/2017   ALT 23 11/15/2017   ALKPHOS 68 11/15/2017   BILITOT 0.3 11/15/2017   GFRNONAA >60 11/15/2017   GFRAA >60 11/15/2017    Lab Results  Component Value Date   WBC 8.1 11/15/2017   NEUTROABS 5.8 11/15/2017   HGB 11.6 (L) 11/15/2017   HCT 34.9 (L) 11/15/2017   MCV 87.5 11/15/2017   PLT 223 11/15/2017     STUDIES: No results found.  ASSESSMENT: Stage IV small cell lung cancer  with liver and bone metastasis.  PLAN:  1. 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  Patient then initiated maintenance Tecentriq on Jun 21, 2017.  CT scan results from September 29, 2017 reviewed independently with continued reduction in size of bilateral pulmonary nodules and overall unchanged hepatic lesions.  MRI of the brain on February 04, 2017 did not reveal any metastatic disease.  Proceed with her next infusion of maintenance Tecentriq today.  Patient last received Zometa on Jun 21, 2017.  Return to clinic in 3 weeks for continuation of treatment and then in 6 weeks for further evaluation and consideration of cycle 16.  Plan to reimage in 6 weeks prior to cycle 16. 2. Pain: Chronic and unchanged.  Continue 50 mg MS Contin every 12 hours and Percocet as needed. 3.  Anemia: Patient's hemoglobin is mildly decreased, but stable.   4.  COPD: Chronic and unchanged.  Continue outpatient palliative care.  Continue current medications as prescribed. 5.  Disposition: Hospice and end-of-life care were previously discussed, but patient does not wish to pursue this option at this time.  She confirms that she is a DNR and is having outpatient palliative care visit. 6.  Nausea: Chronic and unchanged.  Patient does not complain of this today.  Continue Compazine, Phenergan, and Reglan as prescribed.   Patient expressed understanding and was in agreement with this plan. She also understands that She can call clinic at any time with any questions, concerns, or complaints.   Cancer Staging Small cell lung cancer [facility]) Staging form: Lung, AJCC 8th Edition - Clinical stage from 01/28/2017: Stage IV (cT4, cN3, pM1c) - Signed by Lloyd Huger, MD on 01/28/2017   Lloyd Huger, MD   11/18/2017 7:32 AM

## 2017-11-15 ENCOUNTER — Other Ambulatory Visit: Payer: Self-pay

## 2017-11-15 ENCOUNTER — Inpatient Hospital Stay: Payer: Medicare Other | Attending: Oncology

## 2017-11-15 ENCOUNTER — Inpatient Hospital Stay: Payer: Medicare Other

## 2017-11-15 ENCOUNTER — Inpatient Hospital Stay (HOSPITAL_BASED_OUTPATIENT_CLINIC_OR_DEPARTMENT_OTHER): Payer: Medicare Other | Admitting: Oncology

## 2017-11-15 VITALS — BP 106/71 | HR 103 | Temp 94.8°F | Resp 18 | Wt 204.0 lb

## 2017-11-15 DIAGNOSIS — F418 Other specified anxiety disorders: Secondary | ICD-10-CM

## 2017-11-15 DIAGNOSIS — C787 Secondary malignant neoplasm of liver and intrahepatic bile duct: Secondary | ICD-10-CM | POA: Diagnosis not present

## 2017-11-15 DIAGNOSIS — Z79899 Other long term (current) drug therapy: Secondary | ICD-10-CM | POA: Insufficient documentation

## 2017-11-15 DIAGNOSIS — K219 Gastro-esophageal reflux disease without esophagitis: Secondary | ICD-10-CM | POA: Diagnosis not present

## 2017-11-15 DIAGNOSIS — I429 Cardiomyopathy, unspecified: Secondary | ICD-10-CM | POA: Insufficient documentation

## 2017-11-15 DIAGNOSIS — Z5112 Encounter for antineoplastic immunotherapy: Secondary | ICD-10-CM

## 2017-11-15 DIAGNOSIS — D649 Anemia, unspecified: Secondary | ICD-10-CM | POA: Insufficient documentation

## 2017-11-15 DIAGNOSIS — M129 Arthropathy, unspecified: Secondary | ICD-10-CM | POA: Insufficient documentation

## 2017-11-15 DIAGNOSIS — E785 Hyperlipidemia, unspecified: Secondary | ICD-10-CM

## 2017-11-15 DIAGNOSIS — Z66 Do not resuscitate: Secondary | ICD-10-CM

## 2017-11-15 DIAGNOSIS — C7951 Secondary malignant neoplasm of bone: Secondary | ICD-10-CM | POA: Insufficient documentation

## 2017-11-15 DIAGNOSIS — C3490 Malignant neoplasm of unspecified part of unspecified bronchus or lung: Secondary | ICD-10-CM | POA: Insufficient documentation

## 2017-11-15 DIAGNOSIS — J449 Chronic obstructive pulmonary disease, unspecified: Secondary | ICD-10-CM

## 2017-11-15 DIAGNOSIS — I10 Essential (primary) hypertension: Secondary | ICD-10-CM

## 2017-11-15 DIAGNOSIS — Z87891 Personal history of nicotine dependence: Secondary | ICD-10-CM | POA: Diagnosis not present

## 2017-11-15 DIAGNOSIS — I509 Heart failure, unspecified: Secondary | ICD-10-CM

## 2017-11-15 DIAGNOSIS — R5383 Other fatigue: Secondary | ICD-10-CM | POA: Insufficient documentation

## 2017-11-15 DIAGNOSIS — R531 Weakness: Secondary | ICD-10-CM | POA: Insufficient documentation

## 2017-11-15 LAB — COMPREHENSIVE METABOLIC PANEL
ALBUMIN: 3.5 g/dL (ref 3.5–5.0)
ALT: 23 U/L (ref 0–44)
AST: 25 U/L (ref 15–41)
Alkaline Phosphatase: 68 U/L (ref 38–126)
Anion gap: 8 (ref 5–15)
BUN: 17 mg/dL (ref 8–23)
CO2: 24 mmol/L (ref 22–32)
Calcium: 8.9 mg/dL (ref 8.9–10.3)
Chloride: 104 mmol/L (ref 98–111)
Creatinine, Ser: 0.81 mg/dL (ref 0.44–1.00)
GFR calc Af Amer: >60 mL/min (ref >60)
GFR calc non Af Amer: >60 mL/min (ref >60)
GLUCOSE: 108 mg/dL — AB (ref 70–99)
POTASSIUM: 3.7 mmol/L (ref 3.5–5.1)
Sodium: 136 mmol/L (ref 135–145)
Total Bilirubin: 0.3 mg/dL (ref 0.3–1.2)
Total Protein: 7.8 g/dL (ref 6.5–8.1)

## 2017-11-15 LAB — CBC WITH DIFFERENTIAL/PLATELET
Basophils Absolute: 0.1 10*3/uL (ref 0–0.1)
Basophils Relative: 1 %
EOS PCT: 1 %
Eosinophils Absolute: 0.1 10*3/uL (ref 0–0.7)
HCT: 34.9 % — ABNORMAL LOW (ref 35.0–47.0)
Hemoglobin: 11.6 g/dL — ABNORMAL LOW (ref 12.0–16.0)
LYMPHS ABS: 1.6 10*3/uL (ref 1.0–3.6)
LYMPHS PCT: 20 %
MCH: 29.2 pg (ref 26.0–34.0)
MCHC: 33.4 g/dL (ref 32.0–36.0)
MCV: 87.5 fL (ref 80.0–100.0)
MONO ABS: 0.5 10*3/uL (ref 0.2–0.9)
MONOS PCT: 6 %
Neutro Abs: 5.8 10*3/uL (ref 1.4–6.5)
Neutrophils Relative %: 72 %
PLATELETS: 223 10*3/uL (ref 150–440)
RBC: 3.99 MIL/uL (ref 3.80–5.20)
RDW: 14.7 % — AB (ref 11.5–14.5)
WBC: 8.1 10*3/uL (ref 3.6–11.0)

## 2017-11-15 MED ORDER — HEPARIN SOD (PORK) LOCK FLUSH 100 UNIT/ML IV SOLN
500.0000 [IU] | Freq: Once | INTRAVENOUS | Status: AC
  Administered 2017-11-15: 500 [IU] via INTRAVENOUS

## 2017-11-15 MED ORDER — HEPARIN SOD (PORK) LOCK FLUSH 100 UNIT/ML IV SOLN
500.0000 [IU] | Freq: Once | INTRAVENOUS | Status: DC | PRN
  Filled 2017-11-15: qty 5

## 2017-11-15 MED ORDER — SODIUM CHLORIDE 0.9 % IV SOLN
1200.0000 mg | Freq: Once | INTRAVENOUS | Status: AC
  Administered 2017-11-15: 1200 mg via INTRAVENOUS
  Filled 2017-11-15: qty 20

## 2017-11-15 MED ORDER — SODIUM CHLORIDE 0.9 % IV SOLN
Freq: Once | INTRAVENOUS | Status: AC
  Administered 2017-11-15: 11:00:00 via INTRAVENOUS
  Filled 2017-11-15: qty 250

## 2017-11-15 MED ORDER — SODIUM CHLORIDE 0.9% FLUSH
10.0000 mL | Freq: Once | INTRAVENOUS | Status: AC
  Administered 2017-11-15: 10 mL via INTRAVENOUS
  Filled 2017-11-15: qty 10

## 2017-11-15 NOTE — Progress Notes (Signed)
Here for follow up. Per pt she has had nausea and vomiting over the last 3 wk-vomited x 2. Nausea intermittent.

## 2017-11-16 LAB — THYROID PANEL WITH TSH
FREE THYROXINE INDEX: 2.3 (ref 1.2–4.9)
T3 Uptake Ratio: 26 % (ref 24–39)
T4, Total: 8.9 ug/dL (ref 4.5–12.0)
TSH: 8.6 u[IU]/mL — AB (ref 0.450–4.500)

## 2017-11-27 ENCOUNTER — Ambulatory Visit: Payer: Medicare Other | Admitting: Pain Medicine

## 2017-11-28 ENCOUNTER — Ambulatory Visit
Admission: RE | Admit: 2017-11-28 | Discharge: 2017-11-28 | Disposition: A | Discharge status: Home / Self Care | Payer: Medicare Other | Source: Ambulatory Visit | Attending: Pain Medicine | Admitting: Pain Medicine

## 2017-11-28 ENCOUNTER — Ambulatory Visit (HOSPITAL_BASED_OUTPATIENT_CLINIC_OR_DEPARTMENT_OTHER): Payer: Medicare Other | Admitting: Pain Medicine

## 2017-11-28 ENCOUNTER — Other Ambulatory Visit: Payer: Self-pay

## 2017-11-28 ENCOUNTER — Encounter: Payer: Self-pay | Admitting: Pain Medicine

## 2017-11-28 VITALS — BP 94/67 | HR 70 | Temp 97.8°F | Resp 17 | Ht 65.0 in | Wt 204.0 lb

## 2017-11-28 DIAGNOSIS — M19011 Primary osteoarthritis, right shoulder: Secondary | ICD-10-CM | POA: Diagnosis not present

## 2017-11-28 DIAGNOSIS — M25511 Pain in right shoulder: Secondary | ICD-10-CM | POA: Diagnosis not present

## 2017-11-28 DIAGNOSIS — M25512 Pain in left shoulder: Secondary | ICD-10-CM | POA: Diagnosis not present

## 2017-11-28 DIAGNOSIS — M19012 Primary osteoarthritis, left shoulder: Secondary | ICD-10-CM | POA: Insufficient documentation

## 2017-11-28 DIAGNOSIS — G8929 Other chronic pain: Secondary | ICD-10-CM | POA: Insufficient documentation

## 2017-11-28 MED ORDER — ROPIVACAINE HCL 2 MG/ML IJ SOLN
9.0000 mL | Freq: Once | INTRAMUSCULAR | Status: AC
  Administered 2017-11-28: 10 mL via INTRA_ARTICULAR
  Filled 2017-11-28: qty 10

## 2017-11-28 MED ORDER — LIDOCAINE HCL 2 % IJ SOLN
20.0000 mL | Freq: Once | INTRAMUSCULAR | Status: AC
  Administered 2017-11-28: 400 mg

## 2017-11-28 MED ORDER — LIDOCAINE HCL 2 % IJ SOLN
INTRAMUSCULAR | Status: AC
  Filled 2017-11-28: qty 20

## 2017-11-28 MED ORDER — METHYLPREDNISOLONE ACETATE 80 MG/ML IJ SUSP
80.0000 mg | Freq: Once | INTRAMUSCULAR | Status: AC
  Administered 2017-11-28: 80 mg via INTRA_ARTICULAR
  Filled 2017-11-28: qty 1

## 2017-11-28 NOTE — Patient Instructions (Signed)

## 2017-11-28 NOTE — Progress Notes (Signed)
Patient's Name: Grace [name]  MRN: [id number]  Referring Provider: [name], MD  DOB: 1950-09-13  PCP: Grace Pink, MD  DOS: 11/28/2017  Note by: Grace Cola, MD  Service setting: Ambulatory outpatient  Specialty: Interventional Pain Management  Patient type: Established  Location: [facility] (AMB) Pain Management Facility  Visit type: Interventional Procedure   Primary Reason for Visit: Interventional Pain Management Treatment. CC: Shoulder Pain (biilateral)  Procedure:          Anesthesia, Analgesia, Anxiolysis:  Type: Diagnostic Glenohumeral Joint (shoulder) + acromioclavicular joint Injection #1  CPT: 20610-50 Primary Purpose: Diagnostic Region: Superior Shoulder Area Level:  Shoulder Target Area: Glenohumeral Joint (shoulder) + acromioclavicular joint Approach: Anterior approach. Laterality: Bilateral  Type: Local Anesthesia Indication(s): Analgesia         Route: Infiltration (/IM) IV Access: Declined Sedation: Declined  Local Anesthetic: Lidocaine 1-2%  Position: Supine   Indications: 1. Osteoarthritis of shoulders (Bilateral)   2. Chronic shoulder pain (Bilateral) (R>L)   3. Osteoarthritis glenohumeral joints (Bilateral)   4. Osteoarthritis of acromioclavicular joints (Bilateral)    Pain Score: Pre-procedure: 4 /10 Post-procedure: 0-No pain/10  Pre-op Assessment:  Grace [name] is a 67 y.o. (year old), female patient, seen today for interventional treatment. She  has a past surgical history that includes Cardiac catheterization; Cesarean section; Appendectomy; Cataract extraction; Colonoscopy; Lumbar laminectomy/decompression microdiscectomy (Left, 08/01/2016); Laminotomy; and PORTA CATH INSERTION (N/A, 02/03/2017). Grace [name] has a current medication list which includes the following prescription(s): advair diskus, albuterol, alendronate, aripiprazole, duloxetine, lorazepam, metoprolol succinate, morphine, pantoprazole, potassium chloride sa,  sacubitril-valsartan, benzonatate, diphenoxylate-atropine, ipratropium-albuterol, prochlorperazine, and promethazine, and the following Facility-Administered Medications: sodium chloride flush. Her primarily concern today is the Shoulder Pain (biilateral)  Initial Vital Signs:  Pulse/HCG Rate: 70ECG Heart Rate: 87 Temp: 97.8 F (36.6 C) Resp: 16 BP: 96/67 SpO2: 96 %  BMI: Estimated body mass index is 33.95 kg/m as calculated from the following:   Height as of this encounter: 5\' 5"  (1.651 m).   Weight as of this encounter: 204 lb (92.5 kg).  Risk Assessment: Allergies: Reviewed. She is allergic to fish allergy and fentanyl.  Allergy Precautions: None required Coagulopathies: Reviewed. None identified.  Blood-thinner therapy: None at this time Active Infection(s): Reviewed. None identified. Grace [name] is afebrile  Site Confirmation: Grace [name] was asked to confirm the procedure and laterality before marking the site Procedure checklist: Completed Consent: Before the procedure and under the influence of no sedative(s), amnesic(s), or anxiolytics, the patient was informed of the treatment options, risks and possible complications. To fulfill our ethical and legal obligations, as recommended by the American Medical Association's Code of Ethics, I have informed the patient of my clinical impression; the nature and purpose of the treatment or procedure; the risks, benefits, and possible complications of the intervention; the alternatives, including doing nothing; the risk(s) and benefit(s) of the alternative treatment(s) or procedure(s); and the risk(s) and benefit(s) of doing nothing. The patient was provided information about the general risks and possible complications associated with the procedure. These may include, but are not limited to: failure to achieve desired goals, infection, bleeding, organ or nerve damage, allergic reactions, paralysis, and death. In addition, the patient was  informed of those risks and complications associated to the procedure, such as failure to decrease pain; infection; bleeding; organ or nerve damage with subsequent damage to sensory, motor, and/or autonomic systems, resulting in permanent pain, numbness, and/or weakness of one or several areas of the body; allergic reactions; (i.e.: anaphylactic  reaction); and/or death. Furthermore, the patient was informed of those risks and complications associated with the medications. These include, but are not limited to: allergic reactions (i.e.: anaphylactic or anaphylactoid reaction(s)); adrenal axis suppression; blood sugar elevation that in diabetics may result in ketoacidosis or comma; water retention that in patients with history of congestive heart failure may result in shortness of breath, pulmonary edema, and decompensation with resultant heart failure; weight gain; swelling or edema; medication-induced neural toxicity; particulate matter embolism and blood vessel occlusion with resultant organ, and/or nervous system infarction; and/or aseptic necrosis of one or more joints. Finally, the patient was informed that Medicine is not an exact science; therefore, there is also the possibility of unforeseen or unpredictable risks and/or possible complications that may result in a catastrophic outcome. The patient indicated having understood very clearly. We have given the patient no guarantees and we have made no promises. Enough time was given to the patient to ask questions, all of which were answered to the patient's satisfaction. Grace [name] has indicated that she wanted to continue with the procedure. Attestation: I, the ordering provider, attest that I have discussed with the patient the benefits, risks, side-effects, alternatives, likelihood of achieving goals, and potential problems during recovery for the procedure that I have provided informed consent. Date  Time: 11/28/2017 12:12 PM  Pre-Procedure  Preparation:  Monitoring: As per clinic protocol. Respiration, ETCO2, SpO2, BP, heart rate and rhythm monitor placed and checked for adequate function Safety Precautions: Patient was assessed for positional comfort and pressure points before starting the procedure. Time-out: I initiated and conducted the "Time-out" before starting the procedure, as per protocol. The patient was asked to participate by confirming the accuracy of the "Time Out" information. Verification of the correct person, site, and procedure were performed and confirmed by me, the nursing staff, and the patient. "Time-out" conducted as per Joint Commission's Universal Protocol (UP.01.01.01). Time: 1240  Description of Procedure:          Area Prepped: Entire shoulder Area Prepping solution: ChloraPrep (2% chlorhexidine gluconate and 70% isopropyl alcohol) Safety Precautions: Aspiration looking for blood return was conducted prior to all injections. At no point did we inject any substances, as a needle was being advanced. No attempts were made at seeking any paresthesias. Safe injection practices and needle disposal techniques used. Medications properly checked for expiration dates. SDV (single dose vial) medications used. Description of the Procedure: Protocol guidelines were followed. The patient was placed in position over the procedure table. The target area was identified and the area prepped in the usual manner. Skin & deeper tissues infiltrated with local anesthetic. Appropriate amount of time allowed to pass for local anesthetics to take effect. The procedure needles were then advanced to the target area. Proper needle placement secured. Negative aspiration confirmed. Solution injected in intermittent fashion, asking for systemic symptoms every 0.5cc of injectate. The needles were then removed and the area cleansed, making sure to leave some of the prepping solution back to take advantage of its long term bactericidal  properties.    Vitals:   11/28/17 1235 11/28/17 1240 11/28/17 1245 11/28/17 1250  BP: (!) 65/40 (!) 84/58 (!) 86/58 94/67  Pulse:      Resp: (!) 21 (!) 22 18 17   Temp:      TempSrc:      SpO2: 96% 97% 98% 97%  Weight:      Height:        Start Time: 1240 hrs. End Time: 1250 hrs. Materials:  Needle(s) Type: Spinal Needle Gauge: 22G Length: 3.5-in Medication(s): Please see orders for medications and dosing details.  Imaging Guidance (Non-Spinal):          Type of Imaging Technique: Fluoroscopy Guidance (Non-Spinal) Indication(s): Assistance in needle guidance and placement for procedures requiring needle placement in or near specific anatomical locations not easily accessible without such assistance. Exposure Time: Please see nurses notes. Contrast: None used. Fluoroscopic Guidance: I was personally present during the use of fluoroscopy. "Tunnel Vision Technique" used to obtain the best possible view of the target area. Parallax error corrected before commencing the procedure. "Direction-depth-direction" technique used to introduce the needle under continuous pulsed fluoroscopy. Once target was reached, antero-posterior, oblique, and lateral fluoroscopic projection used confirm needle placement in all planes. Images permanently stored in EMR. Interpretation: No contrast injected. I personally interpreted the imaging intraoperatively. Adequate needle placement confirmed in multiple planes. Permanent images saved into the patient's record.  Antibiotic Prophylaxis:   Anti-infectives (From admission, onward)   None     Indication(s): None identified  Post-operative Assessment:  Post-procedure Vital Signs:  Pulse/HCG Rate: 7082 Temp: 97.8 F (36.6 C) Resp: 17 BP: 94/67 SpO2: 97 %  EBL: None  Complications: No immediate post-treatment complications observed by team, or reported by patient.  Note: The patient tolerated the entire procedure well. A repeat set of vitals were  taken after the procedure and the patient was kept under observation following institutional policy, for this type of procedure. Post-procedural neurological assessment was performed, showing return to baseline, prior to discharge. The patient was provided with post-procedure discharge instructions, including a section on how to identify potential problems. Should any problems arise concerning this procedure, the patient was given instructions to immediately contact us, at any time, without hesitation. In any case, we plan to contact the patient by telephone for a follow-up status report regarding this interventional procedure.  Comments:  No additional relevant information.  Plan of Care    Imaging Orders     DG C-Arm 1-60 Min-No Report  Procedure Orders     SHOULDER INJECTION  Medications ordered for procedure: Meds ordered this encounter  Medications  . ropivacaine (PF) 2 mg/mL (0.2%) (NAROPIN) injection 9 mL  . methylPREDNISolone acetate (DEPO-MEDROL) injection 80 mg  . lidocaine (XYLOCAINE) 2 % (with pres) injection 400 mg   Medications administered: We administered ropivacaine (PF) 2 mg/mL (0.2%), methylPREDNISolone acetate, and lidocaine.  See the medical record for exact dosing, route, and time of administration.  Disposition: Discharge home  Discharge Date & Time: 11/28/2017; 1252 hrs.   Physician-requested Follow-up: Return for post-procedure eval (2 wks), w/ Dr. Dossie Arbour.  Future Appointments  Date Time Provider [place]  12/06/2017  9:30 AM [facility] None  12/06/2017  9:45 AM [facility] INFUSION CHAIR 5 [facility] None  12/18/2017  1:15 PM Milinda Pointer, MD [facility] None  12/25/2017  8:00 AM [facility]  12/27/2017  9:30 AM [facility] None  12/27/2017  9:45 AM Grayland Ormond, Kathlene November, MD [facility] None  12/27/2017 10:00 AM [facility]FUSION CHAIR 5 [facility] None   Primary Care Physician: Grace Pink, MD Location:  [facility] Facility Note by: Grace Cola, MD Date: 11/28/2017; Time: 1:11 PM  Disclaimer:  Medicine is not an Chief Strategy Officer. The only guarantee in medicine is that nothing is guaranteed. It is important to note that the decision to proceed with this intervention was based on the information collected from the patient. The Data and conclusions  were drawn from the patient's questionnaire, the interview, and the physical examination. Because the information was provided in large part by the patient, it cannot be guaranteed that it has not been purposely or unconsciously manipulated. Every effort has been made to obtain as much relevant data as possible for this evaluation. It is important to note that the conclusions that lead to this procedure are derived in large part from the available data. Always take into account that the treatment will also be dependent on availability of resources and existing treatment guidelines, considered by other Pain Management Practitioners as being common knowledge and practice, at the time of the intervention. For Medico-Legal purposes, it is also important to point out that variation in procedural techniques and pharmacological choices are the acceptable norm. The indications, contraindications, technique, and results of the above procedure should only be interpreted and judged by a Board-Certified Interventional Pain Specialist with extensive familiarity and expertise in the same exact procedure and technique.

## 2017-11-29 ENCOUNTER — Telehealth: Payer: Self-pay

## 2017-11-29 NOTE — Telephone Encounter (Signed)
Denies any needs at this time. Instructed to call if needed. 

## 2017-12-04 DIAGNOSIS — M9903 Segmental and somatic dysfunction of lumbar region: Secondary | ICD-10-CM | POA: Diagnosis not present

## 2017-12-04 DIAGNOSIS — M9901 Segmental and somatic dysfunction of cervical region: Secondary | ICD-10-CM | POA: Diagnosis not present

## 2017-12-04 DIAGNOSIS — M5417 Radiculopathy, lumbosacral region: Secondary | ICD-10-CM | POA: Diagnosis not present

## 2017-12-04 DIAGNOSIS — M50122 Cervical disc disorder at C5-C6 level with radiculopathy: Secondary | ICD-10-CM | POA: Diagnosis not present

## 2017-12-06 ENCOUNTER — Inpatient Hospital Stay: Payer: Medicare Other

## 2017-12-06 ENCOUNTER — Ambulatory Visit: Payer: Medicare Other

## 2017-12-12 DIAGNOSIS — M9901 Segmental and somatic dysfunction of cervical region: Secondary | ICD-10-CM | POA: Diagnosis not present

## 2017-12-12 DIAGNOSIS — M50122 Cervical disc disorder at C5-C6 level with radiculopathy: Secondary | ICD-10-CM | POA: Diagnosis not present

## 2017-12-12 DIAGNOSIS — M9903 Segmental and somatic dysfunction of lumbar region: Secondary | ICD-10-CM | POA: Diagnosis not present

## 2017-12-12 DIAGNOSIS — M5417 Radiculopathy, lumbosacral region: Secondary | ICD-10-CM | POA: Diagnosis not present

## 2017-12-14 NOTE — Progress Notes (Signed)
Patient's Name: Grace [name]  MRN: [id number]  Referring Provider: [name], MD  DOB: Dec 19, 1950  PCP: [name], MD  DOS: 12/18/2017  Note by: Gaspar Cola, MD  Service setting: Ambulatory outpatient  Specialty: Interventional Pain Management  Location: [facility] (AMB) Pain Management Facility    Patient type: Established   Primary Reason(s) for Visit: Encounter for post-procedure evaluation of chronic illness with mild to moderate exacerbation CC: Back Pain (mid)  HPI  Grace [name] is a 67 y.o. year old, female patient, who comes today for a post-procedure evaluation. 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); Obesity; Tachycardia; Congestive dilated cardiomyopathy ([place]); GERD (gastroesophageal reflux disease); Depression; CHF (congestive heart failure), NYHA class I ([place]); Systolic dysfunction with acute on chronic heart failure ([place]); Lumbar stenosis with neurogenic claudication; Breast cyst; Cardiomyopathy ([place]); Diabetes mellitus type 2, uncomplicated ([place]); Fibroids; Hidradenitis; Nicotine abuse; Osteoarthritis cervical spine; Osteoarthritis of hands (Bilateral); PUD (peptic ulcer disease); Pulmonary nodule; Chronic knee pain (Primary Area of Pain) (Bilateral); Chronic pain syndrome; Osteoarthritis of knees (Bilateral); Thoracic compression fracture (T4, T5, T6, and T7); Long term prescription benzodiazepine use; Long term prescription opiate use; Opiate use; NSAID long-term use; Chronic shoulder pain (Bilateral) (R>L); Chronic elbow pain (Right); Chronic acromioclavicular pain (Right); Osteoarthritis of acromioclavicular joint (Right); Osteoarthritis glenohumeral joint (Left); Medial epicondylitis of elbow (Right); Liver mass; Small cell lung cancer ([place]); Hyponatremia; Dehydration; COPD (chronic obstructive pulmonary disease) ([place]); Osteoarthritis of acromioclavicular joint (Left);  Osteoarthritis of shoulders (Bilateral); Osteoarthritis of acromioclavicular joints (Bilateral); Osteoarthritis glenohumeral joints (Bilateral); Arthropathy/Arthralgia of the shoulder (Bilateral) (R>L); and Chronic musculoskeletal pain on their problem list. Her primarily concern today is the Back Pain (mid)  Pain Assessment: Location: Mid Back Radiating: shoulders at times Onset: More than a month ago Quality: Constant, Sharp, Burning Severity: 7 /10 (subjective, self-reported pain score)  Note: Reported level is inconsistent with clinical observations. Clinically the patient looks like a 3/10 A 3/10 is viewed as "Moderate" and described as significantly interfering with activities of daily living (ADL). It becomes difficult to feed, bathe, get dressed, get on and off the toilet or to perform personal hygiene functions. Difficult to get in and out of bed or a chair without assistance. Very distracting. With effort, it can be ignored when deeply involved in activities. Grace [name] does not seem to understand the use of our objective pain scale When using our objective Pain Scale, levels between 6 and 10/10 are said to belong in an emergency room, as it progressively worsens from a 6/10, described as severely limiting, requiring emergency care not usually available at an outpatient pain management facility. At a 6/10 level, communication becomes difficult and requires great effort. Assistance to reach the emergency department may be required. Facial flushing and profuse sweating along with potentially dangerous increases in heart rate and blood pressure will be evident. Timing: Constant Modifying factors: medications, heat, rest BP: (!) 153/94  HR: (!) 111  Grace [name] comes in today for post-procedure evaluation.  At this point patient is doing much better from her bilateral shoulder pain and bilateral knee pain.  Currently her primary pain is in the left upper back over her left thoracic and lung  area.  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  At this point, since he is still undergoing chemotherapy, I am  going to try to limit her interventional therapies and I will provide her with a trial of tizanidine 4 mg p.o. every 8 hours as needed for muscle pain.  It would seem that she does derive some benefit from having family members massage the area over the left upper back.  I inspected the area and I was unable to find a distinct, palpable trigger point.  Further details on both, my assessment(s), as well as the proposed treatment plan, please see below.  Post-Procedure #2 Assessment  11/28/2017 Procedure: Diagnostic Glenohumeral Joint (shoulder) + acromioclavicular joint Injection #1  under fluoroscopic guidance, no sedation Pre-procedure pain score:  4/10 Post-procedure pain score: 0/10 (100% relief) Influential Factors: BMI: 33.95 kg/m Intra-procedural challenges: None observed.         Assessment challenges: None detected.              Reported side-effects: None.        Post-procedural adverse reactions or complications: None reported         Sedation: No sedation used. When no sedatives are used, the analgesic levels obtained are directly associated to the effectiveness of the local anesthetics. However, when sedation is provided, the level of analgesia obtained during the initial 1 hour following the intervention, is believed to be the result of a combination of factors. These factors may include, but are not limited to: 1. The effectiveness of the local anesthetics used. 2. The effects of the analgesic(s) and/or anxiolytic(s) used. 3. The degree of discomfort experienced by the patient at the time of the procedure. 4. The patients ability and reliability in recalling and recording the events. 5. The presence and influence of possible secondary gains and/or  psychosocial factors. Reported result: Relief experienced during the 1st hour after the procedure: 100 % (Ultra-Short Term Relief)            Interpretative annotation: Clinically appropriate result. No IV Analgesic or Anxiolytic given, therefore benefits are completely due to Local Anesthetic effects.          Effects of local anesthetic: The analgesic effects attained during this period are directly associated to the localized infiltration of local anesthetics and therefore cary significant diagnostic value as to the etiological location, or anatomical origin, of the pain. Expected duration of relief is directly dependent on the pharmacodynamics of the local anesthetic used. Long-acting (4-6 hours) anesthetics used.  Reported result: Relief during the next 4 to 6 hour after the procedure: 100 % (Short-Term Relief)            Interpretative annotation: Clinically appropriate result. Analgesia during this period is likely to be Local Anesthetic-related.          Long-term benefit: Defined as the period of time past the expected duration of local anesthetics (1 hour for short-acting and 4-6 hours for long-acting). With the possible exception of prolonged sympathetic blockade from the local anesthetics, benefits during this period are typically attributed to, or associated with, other factors such as analgesic sensory neuropraxia, antiinflammatory effects, or beneficial biochemical changes provided by agents other than the local anesthetics.  Reported result: Extended relief following procedure: 100 % (Long-Term Relief)            Interpretative annotation: Clinically possible results. Good relief. Therapeutic success. Inflammation plays a part in the etiology to the pain.          Current benefits: Defined as reported results that persistent at this point in time.   Analgesia: 90 %  Function: Somewhat improved ROM: Somewhat improved Interpretative annotation: Ongoing benefit. Therapeutic  success. Effective therapeutic approach. Benefit could be steroid-related.  Interpretation: Results would suggest a successful therapeutic intervention.                  Plan:  Set up procedure as a PRN palliative treatment option for this patient.                Post-Procedure #1 Assessment  11/28/2017 Procedure: Diagnostic bilateral intra-articular knee joint injection with local anesthetic and steroid #1, no fluoroscopy or IV sedation Pre-procedure pain score:  5/10 Post-procedure pain score: 0/10 (100% relief) Influential Factors: BMI: 33.95 kg/m Intra-procedural challenges: None observed.         Assessment challenges: None detected.              Reported side-effects: None.        Post-procedural adverse reactions or complications: None reported         Sedation: No sedation used. When no sedatives are used, the analgesic levels obtained are directly associated to the effectiveness of the local anesthetics. However, when sedation is provided, the level of analgesia obtained during the initial 1 hour following the intervention, is believed to be the result of a combination of factors. These factors may include, but are not limited to: 1. The effectiveness of the local anesthetics used. 2. The effects of the analgesic(s) and/or anxiolytic(s) used. 3. The degree of discomfort experienced by the patient at the time of the procedure. 4. The patients ability and reliability in recalling and recording the events. 5. The presence and influence of possible secondary gains and/or psychosocial factors. Reported result: Relief experienced during the 1st hour after the procedure: 100 % (Ultra-Short Term Relief)            Interpretative annotation: Clinically appropriate result. Analgesia during this period is likely to be Local Anesthetic and/or IV Sedative (Analgesic/Anxiolytic) related.          Effects of local anesthetic: The analgesic effects attained during this period are directly  associated to the localized infiltration of local anesthetics and therefore cary significant diagnostic value as to the etiological location, or anatomical origin, of the pain. Expected duration of relief is directly dependent on the pharmacodynamics of the local anesthetic used. Long-acting (4-6 hours) anesthetics used.  Reported result: Relief during the next 4 to 6 hour after the procedure: 100 % (Short-Term Relief)            Interpretative annotation: Clinically appropriate result. Analgesia during this period is likely to be Local Anesthetic-related.          Long-term benefit: Defined as the period of time past the expected duration of local anesthetics (1 hour for short-acting and 4-6 hours for long-acting). With the possible exception of prolonged sympathetic blockade from the local anesthetics, benefits during this period are typically attributed to, or associated with, other factors such as analgesic sensory neuropraxia, antiinflammatory effects, or beneficial biochemical changes provided by agents other than the local anesthetics.  Reported result: Extended relief following procedure: 100 % (Long-Term Relief)            Interpretative annotation: Clinically possible results. Good relief. Therapeutic success. Inflammation plays a part in the etiology to the pain.          Current benefits: Defined as reported results that persistent at this point in time.   Analgesia: 100 %  Function: Ms. Heikkila reports improvement in function ROM: Ms. Shropshire reports improvement in ROM Interpretative annotation: Ongoing benefit. Therapeutic success. Effective therapeutic approach. Benefit could be steroid-related.  Interpretation: Results would suggest a successful therapeutic intervention.                  Plan:  Set up procedure as a PRN palliative treatment option for this patient.                 Laboratory Chemistry  Inflammation Markers (CRP: Acute Phase) (ESR: Chronic Phase) Lab  Results  Component Value Date   LATICACIDVEN 0.7 08/14/2017                         Rheumatology Markers No results found.  Renal Markers Lab Results  Component Value Date   BUN 17 11/15/2017   CREATININE 0.81 11/15/2017   BCR 13 10/11/2016   GFRAA >60 11/15/2017   GFRNONAA >60 11/15/2017                             Hepatic Markers Lab Results  Component Value Date   AST 25 11/15/2017   ALT 23 11/15/2017   ALBUMIN 3.5 11/15/2017                        Neuropathy Markers Lab Results  Component Value Date   HIV Non Reactive 08/15/2017                        Hematology Parameters Lab Results  Component Value Date   INR 0.97 01/26/2017   LABPROT 12.8 01/26/2017   APTT 26 01/13/2017   PLT 223 11/15/2017   HGB 11.6 (L) 11/15/2017   HCT 34.9 (L) 11/15/2017                        CV Markers Lab Results  Component Value Date   BNP 38.0 08/22/2014   TROPONINI <0.03 08/14/2017                         Note: Lab results reviewed.  Recent Imaging Results   Results for orders placed in visit on 11/28/17  DG C-Arm 1-60 Min-No Report   Narrative Fluoroscopy was utilized by the requesting physician.  No radiographic  interpretation.    Interpretation Report: Fluoroscopy was used during the procedure to assist with needle guidance. The images were interpreted intraoperatively by the requesting physician.  Meds   Current Outpatient Medications:  .  ADVAIR DISKUS 250-50 MCG/DOSE AEPB, Inhale 1 puff into the lungs 2 (two) times daily., Disp: , Rfl:  .  albuterol (PROAIR HFA) 108 (90 Base) MCG/ACT inhaler, Inhale 2 puffs into the lungs every 6 (six) hours as needed for wheezing or shortness of breath. , Disp: , Rfl:  .  alendronate (FOSAMAX) 70 MG tablet, Take 70 mg by mouth every Thursday. , Disp: , Rfl:  .  ARIPiprazole (ABILIFY) 5 MG tablet, Take 5 mg by mouth daily., Disp: , Rfl:  .  benzonatate (TESSALON) 100 MG capsule, Take 1 capsule (100 mg total) by mouth every 8  (eight) hours as needed for cough. (Patient not taking: Reported on 11/15/2017), Disp: 20 capsule, Rfl: 0 .  diphenoxylate-atropine (LOMOTIL) 2.5-0.025 MG tablet, Take 1 tablet by mouth 4 (four) times daily as needed  for diarrhea or loose stools. (Patient not taking: Reported on 11/15/2017), Disp: 30 tablet, Rfl: 0 .  DULoxetine (CYMBALTA) 60 MG capsule, Take 60 mg by mouth daily., Disp: , Rfl:  .  ipratropium-albuterol (DUONEB) 0.5-2.5 (3) MG/3ML SOLN, Take 3 mLs by nebulization every 4 (four) hours as needed. (Patient not taking: Reported on 11/15/2017), Disp: 360 mL, Rfl: 1 .  LORazepam (ATIVAN) 0.5 MG tablet, Take 0.5 mg by mouth as needed., Disp: , Rfl:  .  metoprolol succinate (TOPROL-XL) 50 MG 24 hr tablet, Take 1 tablet (50 mg total) by mouth daily. Take with or immediately following a meal., Disp: 180 tablet, Rfl: 3 .  morphine (MS CONTIN) 15 MG 12 hr tablet, Take 1 tablet (15 mg total) by mouth every 8 (eight) hours., Disp: 90 tablet, Rfl: 0 .  pantoprazole (PROTONIX) 40 MG tablet, Take 1 tablet by mouth daily., Disp: , Rfl:  .  potassium chloride SA (K-DUR,KLOR-CON) 20 MEQ tablet, Take 1 tablet (20 mEq total) by mouth daily., Disp: 30 tablet, Rfl: 2 .  prochlorperazine (COMPAZINE) 10 MG tablet, Take 1 tablet (10 mg total) by mouth every 6 (six) hours as needed (Nausea or vomiting). (Patient not taking: Reported on 11/15/2017), Disp: 60 tablet, Rfl: 2 .  promethazine (PHENERGAN) 25 MG tablet, Take 1 tablet (25 mg total) by mouth every 8 (eight) hours as needed for nausea or vomiting. (Patient not taking: Reported on 11/15/2017), Disp: 30 tablet, Rfl: 2 .  sacubitril-valsartan (ENTRESTO) 24-26 MG, Take 1 tablet by mouth 2 (two) times daily., Disp: 180 tablet, Rfl: 3 .  tiZANidine (ZANAFLEX) 4 MG tablet, Take 1 tablet (4 mg total) by mouth every 8 (eight) hours as needed for muscle spasms., Disp: 90 tablet, Rfl: 2 No current facility-administered medications for this visit.   Facility-Administered  Medications Ordered in Other Visits:  .  sodium chloride flush (NS) 0.9 % injection 10 mL, 10 mL, Intravenous, PRN, [place] Ormond, Kathlene November, MD, 10 mL at 03/01/17 0841  ROS  Constitutional: Denies any fever or chills Gastrointestinal: No reported hemesis, hematochezia, vomiting, or acute GI distress Musculoskeletal: Denies any acute onset joint swelling, redness, loss of ROM, or weakness Neurological: No reported episodes of acute onset apraxia, aphasia, dysarthria, agnosia, amnesia, paralysis, loss of coordination, or loss of consciousness  Allergies  Ms. Groseclose is allergic to fish allergy and fentanyl.  [place]  Drug: Ms. Salvador  reports that she does not use drugs. Alcohol:  reports that she does not drink alcohol. Tobacco:  reports that she quit smoking about 2 years ago. Her smoking use included cigarettes. She has a 25.00 pack-year smoking history. She has never used smokeless tobacco. Medical:  has a past medical history of Anxiety, Arthritis, Asthma, Cancer ([place]), CAP (community acquired pneumonia) (06/30/2014), Cardiomyopathy ([place]), CHF (congestive heart failure) ([place]), COPD (chronic obstructive pulmonary disease) ([place]), Depression, Dyspnea, GERD (gastroesophageal reflux disease), Hyperlipidemia, Hypertension, Pneumonia, PVC's (premature ventricular contractions), Sepsis ([place]) (06/30/2014), SOB (shortness of breath) (05/08/2014), and Spinal cord injury at T7-T12 level [facility]). Surgical: Ms. Pulver  has a past surgical history that includes Cardiac catheterization; Cesarean section; Appendectomy; Cataract extraction; Colonoscopy; Lumbar laminectomy/decompression microdiscectomy (Left, 08/01/2016); Laminotomy; and PORTA CATH INSERTION (N/A, 02/03/2017). Family: family history includes Heart attack (age of onset: 66) in her father.  Constitutional Exam  General appearance: Well nourished, well developed, and well hydrated. In no apparent acute distress Vitals:   12/18/17 1357  BP: (!) 153/94   Pulse: (!) 111  Resp: 18  Temp: 97.8 F (36.6  C)  TempSrc: Oral  SpO2: 98%  Weight: 204 lb (92.5 kg)  Height: '5\' 5"'$  (1.651 m)   BMI Assessment: Estimated body mass index is 33.95 kg/m as calculated from the following:   Height as of this encounter: '5\' 5"'$  (1.651 m).   Weight as of this encounter: 204 lb (92.5 kg).  BMI interpretation table: BMI level Category Range association with higher incidence of chronic pain  <18 kg/m2 Underweight   18.5-24.9 kg/m2 Ideal body weight   25-29.9 kg/m2 Overweight Increased incidence by 20%  30-34.9 kg/m2 Obese (Class I) Increased incidence by 68%  35-39.9 kg/m2 Severe obesity (Class II) Increased incidence by 136%  >40 kg/m2 Extreme obesity (Class III) Increased incidence by 254%   Patient's current BMI Ideal Body weight  Body mass index is 33.95 kg/m. Ideal body weight: 57 kg (125 lb 10.6 oz) Adjusted ideal body weight: 71.2 kg (157 lb)   BMI Readings from Last 4 Encounters:  12/18/17 33.95 kg/m  11/28/17 33.95 kg/m  11/15/17 33.95 kg/m  11/07/17 33.95 kg/m   Wt Readings from Last 4 Encounters:  12/18/17 204 lb (92.5 kg)  11/28/17 204 lb (92.5 kg)  11/15/17 204 lb (92.5 kg)  11/07/17 204 lb (92.5 kg)  Psych/Mental status: Alert, oriented x 3 (person, place, & time)       Eyes: PERLA Respiratory: No evidence of acute respiratory distress  Cervical Spine Area Exam  Skin & Axial Inspection: No masses, redness, edema, swelling, or associated skin lesions Alignment: Symmetrical Functional ROM: Unrestricted ROM      Stability: No instability detected Muscle Tone/Strength: Functionally intact. No obvious neuro-muscular anomalies detected. Sensory (Neurological): Unimpaired Palpation: No palpable anomalies              Upper Extremity (UE) Exam    Side: Right upper extremity  Side: Left upper extremity  Skin & Extremity Inspection: Skin color, temperature, and hair growth are WNL. No peripheral edema or cyanosis. No masses,  redness, swelling, asymmetry, or associated skin lesions. No contractures.  Skin & Extremity Inspection: Skin color, temperature, and hair growth are WNL. No peripheral edema or cyanosis. No masses, redness, swelling, asymmetry, or associated skin lesions. No contractures.  Functional ROM: Unrestricted ROM          Functional ROM: Unrestricted ROM          Muscle Tone/Strength: Functionally intact. No obvious neuro-muscular anomalies detected.  Muscle Tone/Strength: Functionally intact. No obvious neuro-muscular anomalies detected.  Sensory (Neurological): Unimpaired          Sensory (Neurological): Unimpaired          Palpation: No palpable anomalies              Palpation: No palpable anomalies              Provocative Test(s):  Phalen's test: deferred Tinel's test: deferred Apley's scratch test (touch opposite shoulder):  Action 1 (Across chest): deferred Action 2 (Overhead): deferred Action 3 (LB reach): deferred   Provocative Test(s):  Phalen's test: deferred Tinel's test: deferred Apley's scratch test (touch opposite shoulder):  Action 1 (Across chest): deferred Action 2 (Overhead): deferred Action 3 (LB reach): deferred    Thoracic Spine Area Exam  Skin & Axial Inspection: No masses, redness, or swelling Alignment: Symmetrical Functional ROM: Unrestricted ROM Stability: No instability detected Muscle Tone/Strength: Functionally intact. No obvious neuro-muscular anomalies detected. Sensory (Neurological): Unimpaired Muscle strength & Tone: No palpable anomalies  Lumbar Spine Area Exam  Skin & Axial Inspection: No  masses, redness, or swelling Alignment: Symmetrical Functional ROM: Unrestricted ROM       Stability: No instability detected Muscle Tone/Strength: Functionally intact. No obvious neuro-muscular anomalies detected. Sensory (Neurological): Unimpaired Palpation: No palpable anomalies       Provocative Tests: Hyperextension/rotation test: deferred today        Lumbar quadrant test (Kemp's test): deferred today       Lateral bending test: deferred today       Patrick's Maneuver: deferred today                   FABER test: deferred today                   S-I anterior distraction/compression test: deferred today         S-I lateral compression test: deferred today         S-I Thigh-thrust test: deferred today         S-I Gaenslen's test: deferred today          Gait & Posture Assessment  Ambulation: Patient came in today in a wheel chair, primarily due to weakness secondary to her cancer and the fact that she is currently undergoing chemotherapy. Gait: Significantly limited. Dependent on assistive device to ambulate Posture: Antalgic   Lower Extremity Exam    Side: Right lower extremity  Side: Left lower extremity  Stability: No instability observed          Stability: No instability observed          Skin & Extremity Inspection: Skin color, temperature, and hair growth are WNL. No peripheral edema or cyanosis. No masses, redness, swelling, asymmetry, or associated skin lesions. No contractures.  Skin & Extremity Inspection: Skin color, temperature, and hair growth are WNL. No peripheral edema or cyanosis. No masses, redness, swelling, asymmetry, or associated skin lesions. No contractures.  Functional ROM: Unrestricted ROM                  Functional ROM: Unrestricted ROM                  Muscle Tone/Strength: Functionally intact. No obvious neuro-muscular anomalies detected.  Muscle Tone/Strength: Functionally intact. No obvious neuro-muscular anomalies detected.  Sensory (Neurological): Unimpaired  Sensory (Neurological): Unimpaired  Palpation: No palpable anomalies  Palpation: No palpable anomalies   Assessment  Primary Diagnosis & Pertinent Problem List: The primary encounter diagnosis was Chronic pain syndrome. Diagnoses of Chronic knee pain (Primary Area of Pain) (Bilateral), Chronic shoulder pain (Bilateral) (R>L), Chronic  acromioclavicular pain (Right), Arthropathy/Arthralgia of the shoulder (Bilateral) (R>L), Osteoarthritis glenohumeral joint (Left), Osteoarthritis glenohumeral joints (Bilateral), Osteoarthritis of acromioclavicular joint (Left), Osteoarthritis of acromioclavicular joint (Right), Osteoarthritis of acromioclavicular joints (Bilateral), Osteoarthritis of shoulders (Bilateral), Osteoarthritis of knees (Bilateral), and Chronic musculoskeletal pain were also pertinent to this visit.  Status Diagnosis  Controlled Controlled Controlled 1. Chronic pain syndrome   2. Chronic knee pain (Primary Area of Pain) (Bilateral)   3. Chronic shoulder pain (Bilateral) (R>L)   4. Chronic acromioclavicular pain (Right)   5. Arthropathy/Arthralgia of the shoulder (Bilateral) (R>L)   6. Osteoarthritis glenohumeral joint (Left)   7. Osteoarthritis glenohumeral joints (Bilateral)   8. Osteoarthritis of acromioclavicular joint (Left)   9. Osteoarthritis of acromioclavicular joint (Right)   10. Osteoarthritis of acromioclavicular joints (Bilateral)   11. Osteoarthritis of shoulders (Bilateral)   12. Osteoarthritis of knees (Bilateral)   13. Chronic musculoskeletal pain     Problems updated and reviewed during this  visit: Problem  Chronic Musculoskeletal Pain   Plan of Care  Pharmacotherapy (Medications Ordered): Meds ordered this encounter  Medications  . tiZANidine (ZANAFLEX) 4 MG tablet    Sig: Take 1 tablet (4 mg total) by mouth every 8 (eight) hours as needed for muscle spasms.    Dispense:  90 tablet    Refill:  2    Do not place this medication, or any other prescription from our practice, on "Automatic Refill". Patient may have prescription filled one day early if pharmacy is closed on scheduled refill date.   Medications administered today: [name] had no medications administered during this visit.  Procedure Orders    No procedure(s) ordered today   Lab Orders  No laboratory test(s)  ordered today   Imaging Orders  No imaging studies ordered today   Referral Orders  No referral(s) requested today   Interventional management options: Planned, scheduled, and/or pending:   None at this time. Tizanidine trial.   Considering:   Diagnostic bilateral intra-articular knee joint injection with local anesthetic and steroid  Possible series of 5 bilateral intra-articular knee joint injections with Hyalgan  Diagnostic bilateral Genicular nerve block  Possible bilateral Genicular nerve RFA  Diagnostic bilateral intra-articular shoulder joint injection  Diagnostic bilateral suprascapular nerve block  Possible bilateral suprascapular nerve RFA    Palliative PRN treatment(s):   None at this time   Provider-requested follow-up: No follow-ups on file.  Future Appointments  Date Time Provider [place]  12/25/2017  8:00 AM [facility]  12/25/2017  2:00 PM Garald Balding, MD PO-CAR None  12/27/2017  9:30 AM [facility] LAB [facility] None  12/27/2017  9:45 AM Grayland Ormond, Kathlene November, MD [facility] None  12/27/2017 10:00 AM [facility] INFUSION CHAIR 5 [facility] None   Primary Care Physician: [name], MD Location: [facility] Note by: Gaspar Cola, MD Date: 12/18/2017; Time: 3:01 PM

## 2017-12-18 ENCOUNTER — Other Ambulatory Visit: Payer: Self-pay

## 2017-12-18 ENCOUNTER — Encounter: Payer: Self-pay | Admitting: Pain Medicine

## 2017-12-18 ENCOUNTER — Ambulatory Visit: Payer: Medicare Other | Attending: Pain Medicine | Admitting: Pain Medicine

## 2017-12-18 VITALS — BP 153/94 | HR 111 | Temp 97.8°F | Resp 18 | Ht 65.0 in | Wt 204.0 lb

## 2017-12-18 DIAGNOSIS — I429 Cardiomyopathy, unspecified: Secondary | ICD-10-CM | POA: Insufficient documentation

## 2017-12-18 DIAGNOSIS — M25561 Pain in right knee: Secondary | ICD-10-CM | POA: Diagnosis not present

## 2017-12-18 DIAGNOSIS — M25512 Pain in left shoulder: Secondary | ICD-10-CM | POA: Diagnosis not present

## 2017-12-18 DIAGNOSIS — Z87891 Personal history of nicotine dependence: Secondary | ICD-10-CM | POA: Insufficient documentation

## 2017-12-18 DIAGNOSIS — Z85118 Personal history of other malignant neoplasm of bronchus and lung: Secondary | ICD-10-CM | POA: Diagnosis not present

## 2017-12-18 DIAGNOSIS — J449 Chronic obstructive pulmonary disease, unspecified: Secondary | ICD-10-CM | POA: Diagnosis not present

## 2017-12-18 DIAGNOSIS — K219 Gastro-esophageal reflux disease without esophagitis: Secondary | ICD-10-CM | POA: Diagnosis not present

## 2017-12-18 DIAGNOSIS — Z9049 Acquired absence of other specified parts of digestive tract: Secondary | ICD-10-CM | POA: Insufficient documentation

## 2017-12-18 DIAGNOSIS — M25511 Pain in right shoulder: Secondary | ICD-10-CM | POA: Diagnosis not present

## 2017-12-18 DIAGNOSIS — F329 Major depressive disorder, single episode, unspecified: Secondary | ICD-10-CM | POA: Diagnosis not present

## 2017-12-18 DIAGNOSIS — Z8249 Family history of ischemic heart disease and other diseases of the circulatory system: Secondary | ICD-10-CM | POA: Insufficient documentation

## 2017-12-18 DIAGNOSIS — Z885 Allergy status to narcotic agent status: Secondary | ICD-10-CM | POA: Insufficient documentation

## 2017-12-18 DIAGNOSIS — M5417 Radiculopathy, lumbosacral region: Secondary | ICD-10-CM | POA: Diagnosis not present

## 2017-12-18 DIAGNOSIS — Z91013 Allergy to seafood: Secondary | ICD-10-CM | POA: Insufficient documentation

## 2017-12-18 DIAGNOSIS — M48062 Spinal stenosis, lumbar region with neurogenic claudication: Secondary | ICD-10-CM | POA: Insufficient documentation

## 2017-12-18 DIAGNOSIS — Z79899 Other long term (current) drug therapy: Secondary | ICD-10-CM | POA: Diagnosis not present

## 2017-12-18 DIAGNOSIS — I110 Hypertensive heart disease with heart failure: Secondary | ICD-10-CM | POA: Insufficient documentation

## 2017-12-18 DIAGNOSIS — Z7951 Long term (current) use of inhaled steroids: Secondary | ICD-10-CM | POA: Insufficient documentation

## 2017-12-18 DIAGNOSIS — E119 Type 2 diabetes mellitus without complications: Secondary | ICD-10-CM | POA: Insufficient documentation

## 2017-12-18 DIAGNOSIS — M19011 Primary osteoarthritis, right shoulder: Secondary | ICD-10-CM | POA: Diagnosis not present

## 2017-12-18 DIAGNOSIS — F419 Anxiety disorder, unspecified: Secondary | ICD-10-CM | POA: Diagnosis not present

## 2017-12-18 DIAGNOSIS — M47812 Spondylosis without myelopathy or radiculopathy, cervical region: Secondary | ICD-10-CM | POA: Insufficient documentation

## 2017-12-18 DIAGNOSIS — M25562 Pain in left knee: Secondary | ICD-10-CM | POA: Diagnosis not present

## 2017-12-18 DIAGNOSIS — M19012 Primary osteoarthritis, left shoulder: Secondary | ICD-10-CM

## 2017-12-18 DIAGNOSIS — M19042 Primary osteoarthritis, left hand: Secondary | ICD-10-CM | POA: Insufficient documentation

## 2017-12-18 DIAGNOSIS — E785 Hyperlipidemia, unspecified: Secondary | ICD-10-CM | POA: Insufficient documentation

## 2017-12-18 DIAGNOSIS — M7918 Myalgia, other site: Secondary | ICD-10-CM | POA: Insufficient documentation

## 2017-12-18 DIAGNOSIS — E669 Obesity, unspecified: Secondary | ICD-10-CM | POA: Insufficient documentation

## 2017-12-18 DIAGNOSIS — Z7983 Long term (current) use of bisphosphonates: Secondary | ICD-10-CM | POA: Insufficient documentation

## 2017-12-18 DIAGNOSIS — M549 Dorsalgia, unspecified: Secondary | ICD-10-CM | POA: Diagnosis present

## 2017-12-18 DIAGNOSIS — G894 Chronic pain syndrome: Secondary | ICD-10-CM

## 2017-12-18 DIAGNOSIS — M170 Bilateral primary osteoarthritis of knee: Secondary | ICD-10-CM

## 2017-12-18 DIAGNOSIS — G8929 Other chronic pain: Secondary | ICD-10-CM

## 2017-12-18 DIAGNOSIS — M9901 Segmental and somatic dysfunction of cervical region: Secondary | ICD-10-CM | POA: Diagnosis not present

## 2017-12-18 DIAGNOSIS — Z8711 Personal history of peptic ulcer disease: Secondary | ICD-10-CM | POA: Insufficient documentation

## 2017-12-18 DIAGNOSIS — Z6833 Body mass index (BMI) 33.0-33.9, adult: Secondary | ICD-10-CM | POA: Insufficient documentation

## 2017-12-18 DIAGNOSIS — M9903 Segmental and somatic dysfunction of lumbar region: Secondary | ICD-10-CM | POA: Diagnosis not present

## 2017-12-18 DIAGNOSIS — Z79891 Long term (current) use of opiate analgesic: Secondary | ICD-10-CM | POA: Insufficient documentation

## 2017-12-18 DIAGNOSIS — I509 Heart failure, unspecified: Secondary | ICD-10-CM | POA: Diagnosis not present

## 2017-12-18 DIAGNOSIS — M19041 Primary osteoarthritis, right hand: Secondary | ICD-10-CM | POA: Insufficient documentation

## 2017-12-18 DIAGNOSIS — M50122 Cervical disc disorder at C5-C6 level with radiculopathy: Secondary | ICD-10-CM | POA: Diagnosis not present

## 2017-12-18 MED ORDER — TIZANIDINE HCL 4 MG PO TABS: 4.0000 mg | ORAL_TABLET | Freq: Three times a day (TID) | ORAL | 2 refills | Status: DC | PRN

## 2017-12-20 DIAGNOSIS — M9901 Segmental and somatic dysfunction of cervical region: Secondary | ICD-10-CM | POA: Diagnosis not present

## 2017-12-20 DIAGNOSIS — M5417 Radiculopathy, lumbosacral region: Secondary | ICD-10-CM | POA: Diagnosis not present

## 2017-12-20 DIAGNOSIS — M9903 Segmental and somatic dysfunction of lumbar region: Secondary | ICD-10-CM | POA: Diagnosis not present

## 2017-12-20 DIAGNOSIS — M50122 Cervical disc disorder at C5-C6 level with radiculopathy: Secondary | ICD-10-CM | POA: Diagnosis not present

## 2017-12-22 ENCOUNTER — Ambulatory Visit (INDEPENDENT_AMBULATORY_CARE_PROVIDER_SITE_OTHER): Payer: Medicare Other | Admitting: Orthopaedic Surgery

## 2017-12-22 DIAGNOSIS — M9903 Segmental and somatic dysfunction of lumbar region: Secondary | ICD-10-CM | POA: Diagnosis not present

## 2017-12-22 DIAGNOSIS — M5417 Radiculopathy, lumbosacral region: Secondary | ICD-10-CM | POA: Diagnosis not present

## 2017-12-22 DIAGNOSIS — M50122 Cervical disc disorder at C5-C6 level with radiculopathy: Secondary | ICD-10-CM | POA: Diagnosis not present

## 2017-12-22 DIAGNOSIS — M9901 Segmental and somatic dysfunction of cervical region: Secondary | ICD-10-CM | POA: Diagnosis not present

## 2017-12-24 NOTE — Progress Notes (Signed)
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  INTERVAL HISTORY: Patient returns to clinic for further evaluation, discussion of her imaging results, and treatment planning.  She has chronic weakness and fatigue, but otherwise feels well.  She continues to have pain, but this is well controlled with her current narcotic regimen prescribed by pain clinic. She has no neurologic complaints.  She denies any recent fevers or illnesses.  She has a good appetite and denies weight loss.  She has chronic shortness of breath, but denies any chest pain or hemoptysis. She has no abdominal pain.  She denies any melena or hematochezia.  She has no urinary complaints.  Patient offers no specific complaints today.  REVIEW OF SYSTEMS:   Review of Systems  Constitutional: Positive for malaise/fatigue. Negative for fever and weight loss.  Respiratory: Positive for shortness of breath. Negative for cough, hemoptysis and wheezing.   Cardiovascular: Negative.  Negative for chest pain and leg swelling.  Gastrointestinal: Negative.  Negative for abdominal pain, blood in stool, diarrhea, melena, nausea and vomiting.  Genitourinary: Negative.  Negative for dysuria and frequency.  Musculoskeletal: Positive for back pain, joint pain and neck pain.  Skin: Negative.  Negative for rash.  Neurological: Positive for weakness. Negative for sensory change, focal weakness and headaches.  Psychiatric/Behavioral: Negative.  The patient is not nervous/anxious.     As per HPI. Otherwise, a complete review of systems is negative.  PAST MEDICAL HISTORY: Past Medical History:  Diagnosis Date  . Anxiety   . Arthritis   . Asthma    COPD  . Cancer ([place])    Liver  . CAP (community acquired pneumonia) 06/30/2014  . Cardiomyopathy ([place])    nonischemic (EF 35-40%)  . CHF (congestive heart failure) ([place])   . COPD (chronic obstructive pulmonary disease) ([place])   . Depression   . Dyspnea   . GERD (gastroesophageal reflux disease)   . Hyperlipidemia   . Hypertension   . Pneumonia   . PVC's (premature ventricular contractions)    states 10 years ago  . Sepsis ([place]) 06/30/2014  . SOB (shortness of breath) 05/08/2014  . Spinal cord injury at T7-T12 level [facility])    2016    PAST SURGICAL HISTORY: Past Surgical History:  Procedure Laterality Date  . APPENDECTOMY    . CARDIAC CATHETERIZATION    . CATARACT EXTRACTION     right eye   . CESAREAN SECTION    . COLONOSCOPY    . LAMINOTOMY    . LUMBAR LAMINECTOMY/DECOMPRESSION MICRODISCECTOMY Left 08/01/2016   Procedure: Left Lumbar Four-Five Laminectomy and Foraminotomy;  Surgeon: Earnie Larsson, MD;  Location: [place];  Service: Neurosurgery;  Laterality: Left;  . PORTA CATH INSERTION N/A 02/03/2017   Procedure: PORTA CATH INSERTION;  Surgeon: Katha Cabal, MD;  Location: [place] [facility];  Service: Cardiovascular;  Laterality: N/A;    FAMILY HISTORY: Family History  Problem Relation Age of Onset  . Heart attack Father 78       MI x 3     ADVANCED DIRECTIVES (Y/N):  N  HEALTH MAINTENANCE: Social History   Tobacco Use  . Smoking status: Former Smoker    Packs/day: 0.50    Years: 50.00  Pack years: 25.00    Types: Cigarettes    Last attempt to quit: 01/27/2015    Years since quitting: 2.9  . Smokeless tobacco: Never Used  Substance Use Topics  . Alcohol use: No  . Drug use: No     Colonoscopy:  PAP:  Bone density:  Lipid panel:  Allergies  Allergen Reactions  . Fish Allergy Anaphylaxis    Per pt after eating [facility] she had throat swelling and SOB. MSY   . Fentanyl     Overly sedated and groggy, ineffective     Current Outpatient  Medications  Medication Sig Dispense Refill  . ADVAIR DISKUS 250-50 MCG/DOSE AEPB Inhale 1 puff into the lungs 2 (two) times daily.    Marland Kitchen albuterol (PROAIR HFA) 108 (90 Base) MCG/ACT inhaler Inhale 2 puffs into the lungs every 6 (six) hours as needed for wheezing or shortness of breath.     Marland Kitchen alendronate (FOSAMAX) 70 MG tablet Take 70 mg by mouth every Thursday.     . ARIPiprazole (ABILIFY) 5 MG tablet Take 5 mg by mouth daily.    Marland Kitchen BELBUCA 150 MCG FILM Take 1 tablet by mouth daily.  0  . DULoxetine (CYMBALTA) 60 MG capsule Take 60 mg by mouth daily.    Marland Kitchen LORazepam (ATIVAN) 0.5 MG tablet Take 0.5 mg by mouth as needed.    . metoprolol succinate (TOPROL-XL) 50 MG 24 hr tablet Take 1 tablet (50 mg total) by mouth daily. Take with or immediately following a meal. 180 tablet 3  . pantoprazole (PROTONIX) 40 MG tablet Take 1 tablet by mouth daily.    . potassium chloride SA (K-DUR,KLOR-CON) 20 MEQ tablet Take 1 tablet (20 mEq total) by mouth daily. 30 tablet 2  . sacubitril-valsartan (ENTRESTO) 24-26 MG Take 1 tablet by mouth 2 (two) times daily. 180 tablet 3  . tiZANidine (ZANAFLEX) 4 MG tablet Take 1 tablet (4 mg total) by mouth every 8 (eight) hours as needed for muscle spasms. 90 tablet 2  . benzonatate (TESSALON) 100 MG capsule Take 1 capsule (100 mg total) by mouth every 8 (eight) hours as needed for cough. (Patient not taking: Reported on 11/15/2017) 20 capsule 0  . diphenoxylate-atropine (LOMOTIL) 2.5-0.025 MG tablet Take 1 tablet by mouth 4 (four) times daily as needed for diarrhea or loose stools. (Patient not taking: Reported on 11/15/2017) 30 tablet 0  . ipratropium-albuterol (DUONEB) 0.5-2.5 (3) MG/3ML SOLN Take 3 mLs by nebulization every 4 (four) hours as needed. (Patient not taking: Reported on 11/15/2017) 360 mL 1  . prochlorperazine (COMPAZINE) 10 MG tablet Take 1 tablet (10 mg total) by mouth every 6 (six) hours as needed (Nausea or vomiting). (Patient not taking: Reported on 11/15/2017) 60  tablet 2  . promethazine (PHENERGAN) 25 MG tablet Take 1 tablet (25 mg total) by mouth every 8 (eight) hours as needed for nausea or vomiting. (Patient not taking: Reported on 11/15/2017) 30 tablet 2   No current facility-administered medications for this visit.    Facility-Administered Medications Ordered in Other Visits  Medication Dose Route Frequency Provider Last Rate Last Dose  . sodium chloride flush (NS) 0.9 % injection 10 mL  10 mL Intravenous PRN Lloyd Huger, MD   10 mL at 03/01/17 0841    OBJECTIVE: Vitals:   12/27/17 1013  BP: 97/69  Pulse: 75  Resp: 18  Temp: 97.8 F (36.6 C)     Body mass index is 33.95 kg/m.    ECOG FS:1 -  Symptomatic but completely ambulatory  General: Well-developed, well-nourished, no acute distress.  Sitting in a wheelchair. Eyes: Pink conjunctiva, anicteric sclera. HEENT: Normocephalic, moist mucous membranes, clear oropharnyx. Lungs: Clear to auscultation bilaterally. Heart: Regular rate and rhythm. No rubs, murmurs, or gallops. Abdomen: Soft, nontender, nondistended. No organomegaly noted, normoactive bowel sounds. Musculoskeletal: No edema, cyanosis, or clubbing. Neuro: Alert, answering all questions appropriately. Cranial nerves grossly intact. Skin: No rashes or petechiae noted. Psych: Normal affect.  LAB RESULTS:  Lab Results  Component Value Date   NA 136 12/27/2017   K 3.7 12/27/2017   CL 104 12/27/2017   CO2 26 12/27/2017   GLUCOSE 111 (H) 12/27/2017   BUN 15 12/27/2017   CREATININE 0.91 12/27/2017   CALCIUM 8.8 (L) 12/27/2017   PROT 7.5 12/27/2017   ALBUMIN 3.3 (L) 12/27/2017   AST 27 12/27/2017   ALT 22 12/27/2017   ALKPHOS 72 12/27/2017   BILITOT 0.1 (L) 12/27/2017   GFRNONAA >60 12/27/2017   GFRAA >60 12/27/2017    Lab Results  Component Value Date   WBC 8.1 12/27/2017   NEUTROABS 5.9 12/27/2017   HGB 11.7 (L) 12/27/2017   HCT 37.6 12/27/2017   MCV 89.1 12/27/2017   PLT 195 12/27/2017      STUDIES: Ct Chest W Contrast  Result Date: 12/25/2017 CLINICAL DATA:  Follow-up stage IV small cell lung cancer with hepatic and osseous metastatic disease. Chemotherapy completed earlier this year. No current complaints. EXAM: CT CHEST, ABDOMEN, AND PELVIS WITH CONTRAST TECHNIQUE: Multidetector CT imaging of the chest, abdomen and pelvis was performed following the standard protocol during bolus administration of intravenous contrast. CONTRAST:  175mL ISOVUE-300 IOPAMIDOL (ISOVUE-300) INJECTION 61% COMPARISON:  Prior CTs 09/29/2017 and 08/14/2017. FINDINGS: CT CHEST FINDINGS Cardiovascular: There is atherosclerosis of the aorta, great vessels and coronary arteries. There is occlusion of the left upper lobe pulmonary artery by recurrent tumor in the left suprahilar area. No acute vascular findings are seen. The heart size is normal. There is no pericardial effusion. Mediastinum/Nodes: There is recurrent, confluent adenopathy in the AP window. There is a 2.6 x 1.9 cm component medially on image 16/2. There is a 1.3 x 1.6 cm subcarinal node on image 22/2. There is a left hilar/suprahilar mass, measuring up to 2.9 x 4.7 cm on image 16/2. No axillary adenopathy. The thyroid gland, trachea and esophagus demonstrate no significant findings. Lungs/Pleura: There is no pleural effusion. As above, there is a recurrent left hilar mass with suprahilar extension. There is mild associated postobstructive pneumonitis in the left upper lobe which has increased. There is stable residual linear right lower lobe nodularity at the site of previously demonstrated metastases (axial images 63 and 82/4). Subpleural nodularity in the left lower lobe measuring 9 x 5 mm on image 87/4 stable. There are no other new or enlarging nodules. Mild underlying centrilobular emphysema. Musculoskeletal/Chest wall: There are stable sclerotic lesions within the thoracic vertebral bodies. Severe compression deformity and osseous retropulsion at  T5 is stable. There are stable mild compression deformities involving the inferior endplate of T4 and the superior endplate of T7. Incompletely healed fracture of the left 7th rib is stable. CT ABDOMEN AND PELVIS FINDINGS Hepatobiliary: Interval progression of multifocal hepatic metastatic disease. Lesions within the left lobe are confluent and difficult to measure, although measure approximately 6.9 x 5.0 cm on image 54/2 and 7.5 x 5.0 cm on image 55/2. There is a 2.5 x 1.5 cm lesion in the right lobe on image 49/2. The gallbladder is  incompletely distended. No evidence of gallstones, gallbladder wall thickening or significant biliary dilatation. Pancreas: Unremarkable. No pancreatic ductal dilatation or surrounding inflammatory changes. Spleen: Normal in size without focal abnormality. Adrenals/Urinary Tract: Both adrenal glands appear normal. The kidneys appear normal without evidence of urinary tract calculus, suspicious lesion or hydronephrosis. No bladder abnormalities are seen. Stomach/Bowel: No evidence of bowel wall thickening, distention or surrounding inflammatory change. There is moderate stool throughout the colon. There is mild fecalization of the distal small bowel without wall thickening or surrounding inflammation. There are mild diverticular changes in the distal colon. Vascular/Lymphatic: Porta hepatis and peripancreatic adenopathy has mildly progressed. There is a 12 mm portacaval node on image 58/2 and a 13 mm celiac node on image 55/2. No pelvic adenopathy. There is moderate aortic and branch vessel atherosclerosis without evidence of acute vascular findings. Reproductive: The uterus and ovaries appear normal. No adnexal mass. Other: Intact anterior abdominal wall. No ascites or peritoneal nodularity. Musculoskeletal: There are stable probable sclerotic metastases within the bony pelvis and lumbar spine. Most prominent lesion in the left iliac bone measures 1.7 cm on image 89/2. There are  stable chronic superior endplate compression deformities at L1 and L5. There are chronic bilateral L4 pars fractures with an associated grade 1 anterolisthesis and biforaminal narrowing at L4-5. IMPRESSION: 1. Local thoracic recurrence with a left hilar/suprahilar mass and progressive mediastinal lymphadenopathy. 2. Significant progression in multifocal hepatic metastatic disease. Mildly enlarged lymph nodes in the upper abdomen are likely metastases as well. 3. No change in residual mild peripheral lung nodularity and osseous metastatic disease. 4. Stable thoracolumbar compression fractures. Electronically Signed   By: Richardean Sale M.D.   On: 12/25/2017 14:59   Ct Abdomen Pelvis W Contrast  Result Date: 12/25/2017 CLINICAL DATA:  Follow-up stage IV small cell lung cancer with hepatic and osseous metastatic disease. Chemotherapy completed earlier this year. No current complaints. EXAM: CT CHEST, ABDOMEN, AND PELVIS WITH CONTRAST TECHNIQUE: Multidetector CT imaging of the chest, abdomen and pelvis was performed following the standard protocol during bolus administration of intravenous contrast. CONTRAST:  136mL ISOVUE-300 IOPAMIDOL (ISOVUE-300) INJECTION 61% COMPARISON:  Prior CTs 09/29/2017 and 08/14/2017. FINDINGS: CT CHEST FINDINGS Cardiovascular: There is atherosclerosis of the aorta, great vessels and coronary arteries. There is occlusion of the left upper lobe pulmonary artery by recurrent tumor in the left suprahilar area. No acute vascular findings are seen. The heart size is normal. There is no pericardial effusion. Mediastinum/Nodes: There is recurrent, confluent adenopathy in the AP window. There is a 2.6 x 1.9 cm component medially on image 16/2. There is a 1.3 x 1.6 cm subcarinal node on image 22/2. There is a left hilar/suprahilar mass, measuring up to 2.9 x 4.7 cm on image 16/2. No axillary adenopathy. The thyroid gland, trachea and esophagus demonstrate no significant findings. Lungs/Pleura:  There is no pleural effusion. As above, there is a recurrent left hilar mass with suprahilar extension. There is mild associated postobstructive pneumonitis in the left upper lobe which has increased. There is stable residual linear right lower lobe nodularity at the site of previously demonstrated metastases (axial images 63 and 82/4). Subpleural nodularity in the left lower lobe measuring 9 x 5 mm on image 87/4 stable. There are no other new or enlarging nodules. Mild underlying centrilobular emphysema. Musculoskeletal/Chest wall: There are stable sclerotic lesions within the thoracic vertebral bodies. Severe compression deformity and osseous retropulsion at T5 is stable. There are stable mild compression deformities involving the inferior endplate of T4 and  the superior endplate of T7. Incompletely healed fracture of the left 7th rib is stable. CT ABDOMEN AND PELVIS FINDINGS Hepatobiliary: Interval progression of multifocal hepatic metastatic disease. Lesions within the left lobe are confluent and difficult to measure, although measure approximately 6.9 x 5.0 cm on image 54/2 and 7.5 x 5.0 cm on image 55/2. There is a 2.5 x 1.5 cm lesion in the right lobe on image 49/2. The gallbladder is incompletely distended. No evidence of gallstones, gallbladder wall thickening or significant biliary dilatation. Pancreas: Unremarkable. No pancreatic ductal dilatation or surrounding inflammatory changes. Spleen: Normal in size without focal abnormality. Adrenals/Urinary Tract: Both adrenal glands appear normal. The kidneys appear normal without evidence of urinary tract calculus, suspicious lesion or hydronephrosis. No bladder abnormalities are seen. Stomach/Bowel: No evidence of bowel wall thickening, distention or surrounding inflammatory change. There is moderate stool throughout the colon. There is mild fecalization of the distal small bowel without wall thickening or surrounding inflammation. There are mild diverticular  changes in the distal colon. Vascular/Lymphatic: Porta hepatis and peripancreatic adenopathy has mildly progressed. There is a 12 mm portacaval node on image 58/2 and a 13 mm celiac node on image 55/2. No pelvic adenopathy. There is moderate aortic and branch vessel atherosclerosis without evidence of acute vascular findings. Reproductive: The uterus and ovaries appear normal. No adnexal mass. Other: Intact anterior abdominal wall. No ascites or peritoneal nodularity. Musculoskeletal: There are stable probable sclerotic metastases within the bony pelvis and lumbar spine. Most prominent lesion in the left iliac bone measures 1.7 cm on image 89/2. There are stable chronic superior endplate compression deformities at L1 and L5. There are chronic bilateral L4 pars fractures with an associated grade 1 anterolisthesis and biforaminal narrowing at L4-5. IMPRESSION: 1. Local thoracic recurrence with a left hilar/suprahilar mass and progressive mediastinal lymphadenopathy. 2. Significant progression in multifocal hepatic metastatic disease. Mildly enlarged lymph nodes in the upper abdomen are likely metastases as well. 3. No change in residual mild peripheral lung nodularity and osseous metastatic disease. 4. Stable thoracolumbar compression fractures. Electronically Signed   By: Richardean Sale M.D.   On: 12/25/2017 14:59   Dg C-arm 1-60 Min-no Report  Result Date: 11/28/2017 Fluoroscopy was utilized by the requesting physician.  No radiographic interpretation.    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  PLAN:  1. 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  Patient then initiated maintenance Tecentriq on Jun 21, 2017.  CT scan results from December 25, 2017 reviewed independently and reported as above with progressive disease in  her mediastinum as well as significant progression of multifocal hepatic disease.  Hospice was briefly discussed, but patient wishes to continue aggressive treatment therefore will return to clinic next week to initiate topotecan days 1 through 5 on a 21-day cycle.  Patient last received Zometa on Jun 21, 2017.  Return to clinic on Monday to initiate cycle 1, day 1.  Plan to reimage after 4 cycles. 2. Pain: Chronic and unchanged.  Patient is now seen in pain clinic.  States her narcotic has been switched to belbuca. 3.  Anemia: Hemoglobin decreased but stable, monitor. 4.  COPD: Chronic and unchanged.  Continue outpatient palliative care.  Continue current medications as prescribed. 5.  Disposition: Hospice and end-of-life care were once again discussed today, but patient wishes to continue aggressive  treatments.  She expressed understanding that her treatment options are limited.  She confirms that she is a DNR and is having outpatient palliative care visit.   Patient expressed understanding and was in agreement with this plan. She also understands that She can call clinic at any time with any questions, concerns, or complaints.   Cancer Staging Small cell lung cancer [facility]) Staging form: Lung, AJCC 8th Edition - Clinical stage from 01/28/2017: Stage IV (cT4, cN3, pM1c) - Signed by Lloyd Huger, MD on 01/28/2017   Lloyd Huger, MD   12/28/2017 6:52 AM

## 2017-12-25 ENCOUNTER — Ambulatory Visit (INDEPENDENT_AMBULATORY_CARE_PROVIDER_SITE_OTHER): Payer: Medicare Other | Admitting: Orthopaedic Surgery

## 2017-12-25 ENCOUNTER — Ambulatory Visit
Admission: RE | Admit: 2017-12-25 | Discharge: 2017-12-25 | Disposition: A | Discharge status: Home / Self Care | Payer: Medicare Other | Source: Ambulatory Visit | Attending: Oncology | Admitting: Oncology

## 2017-12-25 DIAGNOSIS — R590 Localized enlarged lymph nodes: Secondary | ICD-10-CM | POA: Diagnosis not present

## 2017-12-25 DIAGNOSIS — C787 Secondary malignant neoplasm of liver and intrahepatic bile duct: Secondary | ICD-10-CM | POA: Diagnosis not present

## 2017-12-25 DIAGNOSIS — C7951 Secondary malignant neoplasm of bone: Secondary | ICD-10-CM | POA: Insufficient documentation

## 2017-12-25 DIAGNOSIS — M4855XA Collapsed vertebra, not elsewhere classified, thoracolumbar region, initial encounter for fracture: Secondary | ICD-10-CM | POA: Insufficient documentation

## 2017-12-25 DIAGNOSIS — R222 Localized swelling, mass and lump, trunk: Secondary | ICD-10-CM | POA: Diagnosis not present

## 2017-12-25 DIAGNOSIS — C3490 Malignant neoplasm of unspecified part of unspecified bronchus or lung: Secondary | ICD-10-CM | POA: Insufficient documentation

## 2017-12-25 MED ORDER — IOPAMIDOL (ISOVUE-300) INJECTION 61%
100.0000 mL | Freq: Once | INTRAVENOUS | Status: AC | PRN
  Administered 2017-12-25: 100 mL via INTRAVENOUS

## 2017-12-27 ENCOUNTER — Inpatient Hospital Stay (HOSPITAL_BASED_OUTPATIENT_CLINIC_OR_DEPARTMENT_OTHER): Payer: Medicare Other | Admitting: Oncology

## 2017-12-27 ENCOUNTER — Inpatient Hospital Stay: Payer: Medicare Other

## 2017-12-27 ENCOUNTER — Inpatient Hospital Stay: Payer: Medicare Other | Attending: Oncology

## 2017-12-27 ENCOUNTER — Encounter: Payer: Self-pay | Admitting: Oncology

## 2017-12-27 VITALS — BP 97/69 | HR 75 | Temp 97.8°F | Resp 18 | Wt 204.0 lb

## 2017-12-27 DIAGNOSIS — Z5111 Encounter for antineoplastic chemotherapy: Secondary | ICD-10-CM | POA: Diagnosis not present

## 2017-12-27 DIAGNOSIS — C3412 Malignant neoplasm of upper lobe, left bronchus or lung: Secondary | ICD-10-CM

## 2017-12-27 DIAGNOSIS — Z95828 Presence of other vascular implants and grafts: Secondary | ICD-10-CM

## 2017-12-27 DIAGNOSIS — C7951 Secondary malignant neoplasm of bone: Secondary | ICD-10-CM | POA: Insufficient documentation

## 2017-12-27 DIAGNOSIS — C3490 Malignant neoplasm of unspecified part of unspecified bronchus or lung: Secondary | ICD-10-CM

## 2017-12-27 DIAGNOSIS — C787 Secondary malignant neoplasm of liver and intrahepatic bile duct: Secondary | ICD-10-CM | POA: Diagnosis not present

## 2017-12-27 DIAGNOSIS — D509 Iron deficiency anemia, unspecified: Secondary | ICD-10-CM | POA: Insufficient documentation

## 2017-12-27 DIAGNOSIS — K219 Gastro-esophageal reflux disease without esophagitis: Secondary | ICD-10-CM | POA: Diagnosis not present

## 2017-12-27 DIAGNOSIS — F418 Other specified anxiety disorders: Secondary | ICD-10-CM | POA: Diagnosis not present

## 2017-12-27 DIAGNOSIS — E785 Hyperlipidemia, unspecified: Secondary | ICD-10-CM | POA: Insufficient documentation

## 2017-12-27 DIAGNOSIS — Z79899 Other long term (current) drug therapy: Secondary | ICD-10-CM

## 2017-12-27 DIAGNOSIS — Z87891 Personal history of nicotine dependence: Secondary | ICD-10-CM

## 2017-12-27 DIAGNOSIS — I429 Cardiomyopathy, unspecified: Secondary | ICD-10-CM

## 2017-12-27 DIAGNOSIS — I110 Hypertensive heart disease with heart failure: Secondary | ICD-10-CM | POA: Insufficient documentation

## 2017-12-27 DIAGNOSIS — J449 Chronic obstructive pulmonary disease, unspecified: Secondary | ICD-10-CM

## 2017-12-27 DIAGNOSIS — I509 Heart failure, unspecified: Secondary | ICD-10-CM | POA: Insufficient documentation

## 2017-12-27 LAB — CBC WITH DIFFERENTIAL/PLATELET
ABS IMMATURE GRANULOCYTES: 0.03 10*3/uL (ref 0.00–0.07)
BASOS ABS: 0 10*3/uL (ref 0.0–0.1)
Basophils Relative: 0 %
Eosinophils Absolute: 0.1 10*3/uL (ref 0.0–0.5)
Eosinophils Relative: 2 %
HCT: 37.6 % (ref 36.0–46.0)
HEMOGLOBIN: 11.7 g/dL — AB (ref 12.0–15.0)
Immature Granulocytes: 0 %
LYMPHS PCT: 18 %
Lymphs Abs: 1.4 10*3/uL (ref 0.7–4.0)
MCH: 27.7 pg (ref 26.0–34.0)
MCHC: 31.1 g/dL (ref 30.0–36.0)
MCV: 89.1 fL (ref 80.0–100.0)
MONO ABS: 0.6 10*3/uL (ref 0.1–1.0)
Monocytes Relative: 7 %
NEUTROS ABS: 5.9 10*3/uL (ref 1.7–7.7)
NEUTROS PCT: 73 %
NRBC: 0 % (ref 0.0–0.2)
Platelets: 195 10*3/uL (ref 150–400)
RBC: 4.22 MIL/uL (ref 3.87–5.11)
RDW: 13.8 % (ref 11.5–15.5)
WBC: 8.1 10*3/uL (ref 4.0–10.5)

## 2017-12-27 LAB — COMPREHENSIVE METABOLIC PANEL
ALBUMIN: 3.3 g/dL — AB (ref 3.5–5.0)
ALT: 22 U/L (ref 0–44)
ANION GAP: 6 (ref 5–15)
AST: 27 U/L (ref 15–41)
Alkaline Phosphatase: 72 U/L (ref 38–126)
BUN: 15 mg/dL (ref 8–23)
CHLORIDE: 104 mmol/L (ref 98–111)
CO2: 26 mmol/L (ref 22–32)
Calcium: 8.8 mg/dL — ABNORMAL LOW (ref 8.9–10.3)
Creatinine, Ser: 0.91 mg/dL (ref 0.44–1.00)
GFR calc Af Amer: >60 mL/min (ref >60)
Glucose, Bld: 111 mg/dL — ABNORMAL HIGH (ref 70–99)
POTASSIUM: 3.7 mmol/L (ref 3.5–5.1)
Sodium: 136 mmol/L (ref 135–145)
Total Bilirubin: 0.1 mg/dL — ABNORMAL LOW (ref 0.3–1.2)
Total Protein: 7.5 g/dL (ref 6.5–8.1)

## 2017-12-27 MED ORDER — HEPARIN SOD (PORK) LOCK FLUSH 100 UNIT/ML IV SOLN
500.0000 [IU] | Freq: Once | INTRAVENOUS | Status: AC
  Administered 2017-12-27: 500 [IU] via INTRAVENOUS
  Filled 2017-12-27: qty 5

## 2017-12-27 NOTE — Progress Notes (Signed)
DISCONTINUE ON PATHWAY REGIMEN - Small Cell Lung     Cycles 1 through 4, every 21 days:     Atezolizumab      Carboplatin      Etoposide    Cycles 5 and beyond, every 21 days:     Atezolizumab   **Always confirm dose/schedule in your pharmacy ordering system**  REASON: Disease Progression PRIOR TREATMENT: [id number]: Atezolizumab 1200 mg D1 + Carboplatin AUC=5 D1 + Etoposide 100 mg/m2 D1-3 q21 Days x 4 Cycles, Followed by Atezolizumab 1200 mg Maintenance Until Progression or Unacceptable Toxicity TREATMENT RESPONSE: Progressive Disease (PD)  START ON PATHWAY REGIMEN - Small Cell Lung     A cycle is every 21 days:     Topotecan   **Always confirm dose/schedule in your pharmacy ordering system**  Patient Characteristics: Relapsed or Progressive Disease, Second Line, Relapse 3 - 6 Months Therapeutic Status: Relapsed or Progressive Disease Line of Therapy: Second Line Time to Relapse: Relapse 3 - 6 Months Intent of Therapy: Non-Curative / Palliative Intent, Discussed with Patient

## 2017-12-27 NOTE — Progress Notes (Signed)
Pt in for follow up denies any difficulties or concerns.  Pt reports pain med switched to belbuca.

## 2017-12-28 DIAGNOSIS — M5417 Radiculopathy, lumbosacral region: Secondary | ICD-10-CM | POA: Diagnosis not present

## 2017-12-28 DIAGNOSIS — M9903 Segmental and somatic dysfunction of lumbar region: Secondary | ICD-10-CM | POA: Diagnosis not present

## 2017-12-28 DIAGNOSIS — M9901 Segmental and somatic dysfunction of cervical region: Secondary | ICD-10-CM | POA: Diagnosis not present

## 2017-12-28 DIAGNOSIS — M50122 Cervical disc disorder at C5-C6 level with radiculopathy: Secondary | ICD-10-CM | POA: Diagnosis not present

## 2017-12-28 LAB — THYROID PANEL WITH TSH
Free Thyroxine Index: 2.1 (ref 1.2–4.9)
T3 UPTAKE RATIO: 24 % (ref 24–39)
T4 TOTAL: 8.8 ug/dL (ref 4.5–12.0)
TSH: 9.38 u[IU]/mL — AB (ref 0.450–4.500)

## 2017-12-30 NOTE — Progress Notes (Signed)
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  INTERVAL HISTORY: Patient returns to clinic today for further evaluation and initiation of cycle 1, day 1 of topotecan.  She continues to have chronic weakness and fatigue, but otherwise feels well.  She continues to have pain that is also chronic and unchanged.  She has no neurologic complaints.  She denies any recent fevers or illnesses.  She has a good appetite and denies weight loss.  She has chronic shortness of breath, but denies any chest pain or hemoptysis. She has no abdominal pain.  She denies any melena or hematochezia.  She has no urinary complaints.  Patient offers no specific complaints today.  REVIEW OF SYSTEMS:   Review of Systems  Constitutional: Positive for malaise/fatigue. Negative for fever and weight loss.  Respiratory: Positive for shortness of breath. Negative for cough, hemoptysis and wheezing.   Cardiovascular: Negative.  Negative for chest pain and leg swelling.  Gastrointestinal: Negative.  Negative for abdominal pain, blood in stool, diarrhea, melena, nausea and vomiting.  Genitourinary: Negative.  Negative for dysuria and frequency.  Musculoskeletal: Positive for back pain, joint pain and neck pain.  Skin: Negative.  Negative for rash.  Neurological: Positive for weakness. Negative for sensory change, focal weakness and headaches.  Psychiatric/Behavioral: Negative.  The patient is not nervous/anxious.     As per HPI. Otherwise, a complete review of systems is negative.  PAST MEDICAL HISTORY: Past Medical History:  Diagnosis Date  . Anxiety   . Arthritis   . Asthma    COPD  . Cancer ([place])    Liver  . CAP  (community acquired pneumonia) 06/30/2014  . Cardiomyopathy ([place])    nonischemic (EF 35-40%)  . CHF (congestive heart failure) ([place])   . COPD (chronic obstructive pulmonary disease) ([place])   . Depression   . Dyspnea   . GERD (gastroesophageal reflux disease)   . Hyperlipidemia   . Hypertension   . Pneumonia   . PVC's (premature ventricular contractions)    states 10 years ago  . Sepsis ([place]) 06/30/2014  . SOB (shortness of breath) 05/08/2014  . Spinal cord injury at T7-T12 level [facility])    2016    PAST SURGICAL HISTORY: Past Surgical History:  Procedure Laterality Date  . APPENDECTOMY    . CARDIAC CATHETERIZATION    . CATARACT EXTRACTION     right eye   . CESAREAN SECTION    . COLONOSCOPY    . LAMINOTOMY    . LUMBAR LAMINECTOMY/DECOMPRESSION MICRODISCECTOMY Left 08/01/2016   Procedure: Left Lumbar Four-Five Laminectomy and Foraminotomy;  Surgeon: Earnie Larsson, MD;  Location: [place];  Service: Neurosurgery;  Laterality: Left;  . PORTA CATH INSERTION N/A 02/03/2017   Procedure: PORTA CATH INSERTION;  Surgeon: Katha Cabal, MD;  Location: [place] [facility];  Service: Cardiovascular;  Laterality: N/A;    FAMILY HISTORY: Family History  Problem Relation Age of Onset  . Heart attack Father 21       MI x 3     ADVANCED DIRECTIVES (Y/N):  N  HEALTH MAINTENANCE: Social History   Tobacco Use  . Smoking status: Former Smoker    Packs/day: 0.50    Years: 50.00    Pack  years: 25.00    Types: Cigarettes    Last attempt to quit: 01/27/2015    Years since quitting: 2.9  . Smokeless tobacco: Never Used  Substance Use Topics  . Alcohol use: No  . Drug use: No     Colonoscopy:  PAP:  Bone density:  Lipid panel:  Allergies  Allergen Reactions  . Fish Allergy Anaphylaxis    Per pt after eating [facility] she had throat swelling and SOB. MSY   . Fentanyl     Overly sedated and groggy, ineffective     Current Outpatient Medications  Medication Sig Dispense Refill    . ADVAIR DISKUS 250-50 MCG/DOSE AEPB Inhale 1 puff into the lungs 2 (two) times daily.    Marland Kitchen albuterol (PROAIR HFA) 108 (90 Base) MCG/ACT inhaler Inhale 2 puffs into the lungs every 6 (six) hours as needed for wheezing or shortness of breath.     Marland Kitchen alendronate (FOSAMAX) 70 MG tablet Take 70 mg by mouth every Thursday.     . ARIPiprazole (ABILIFY) 5 MG tablet Take 5 mg by mouth daily.    Marland Kitchen BELBUCA 150 MCG FILM Take 1 tablet by mouth daily.  0  . DULoxetine (CYMBALTA) 60 MG capsule Take 60 mg by mouth daily.    Marland Kitchen ipratropium-albuterol (DUONEB) 0.5-2.5 (3) MG/3ML SOLN Take 3 mLs by nebulization every 4 (four) hours as needed. 360 mL 1  . metoprolol succinate (TOPROL-XL) 50 MG 24 hr tablet Take 1 tablet (50 mg total) by mouth daily. Take with or immediately following a meal. 180 tablet 3  . pantoprazole (PROTONIX) 40 MG tablet Take 1 tablet by mouth daily.    . potassium chloride SA (K-DUR,KLOR-CON) 20 MEQ tablet Take 1 tablet (20 mEq total) by mouth daily. 30 tablet 2  . sacubitril-valsartan (ENTRESTO) 24-26 MG Take 1 tablet by mouth 2 (two) times daily. 180 tablet 3  . benzonatate (TESSALON) 100 MG capsule Take 1 capsule (100 mg total) by mouth every 8 (eight) hours as needed for cough. (Patient not taking: Reported on 01/01/2018) 20 capsule 0  . diphenoxylate-atropine (LOMOTIL) 2.5-0.025 MG tablet Take 1 tablet by mouth 4 (four) times daily as needed for diarrhea or loose stools. (Patient not taking: Reported on 01/01/2018) 30 tablet 0  . LORazepam (ATIVAN) 0.5 MG tablet Take 0.5 mg by mouth as needed.    . prochlorperazine (COMPAZINE) 10 MG tablet Take 1 tablet (10 mg total) by mouth every 6 (six) hours as needed (Nausea or vomiting). (Patient not taking: Reported on 01/01/2018) 60 tablet 2  . promethazine (PHENERGAN) 25 MG tablet Take 1 tablet (25 mg total) by mouth every 8 (eight) hours as needed for nausea or vomiting. (Patient not taking: Reported on 01/01/2018) 30 tablet 2  . tiZANidine  (ZANAFLEX) 4 MG tablet Take 1 tablet (4 mg total) by mouth every 8 (eight) hours as needed for muscle spasms. (Patient not taking: Reported on 01/01/2018) 90 tablet 2   No current facility-administered medications for this visit.    Facility-Administered Medications Ordered in Other Visits  Medication Dose Route Frequency Provider Last Rate Last Dose  . sodium chloride flush (NS) 0.9 % injection 10 mL  10 mL Intravenous PRN Lloyd Huger, MD   10 mL at 03/01/17 0841  . sodium chloride flush (NS) 0.9 % injection 10 mL  10 mL Intracatheter PRN Lloyd Huger, MD        OBJECTIVE: Vitals:   01/01/18 1039  BP: 103/74  Pulse: 76  Resp: 18  Temp: (!) 94.9 F (34.9 C)     Body mass index is 33.95 kg/m.    ECOG FS:1 - Symptomatic but completely ambulatory  General: Well-developed, well-nourished, no acute distress.  Sitting in a wheelchair. Eyes: Pink conjunctiva, anicteric sclera. HEENT: Normocephalic, moist mucous membranes. Lungs: Clear to auscultation bilaterally. Heart: Regular rate and rhythm. No rubs, murmurs, or gallops. Abdomen: Soft, nontender, nondistended. No organomegaly noted, normoactive bowel sounds. Musculoskeletal: No edema, cyanosis, or clubbing. Neuro: Alert, answering all questions appropriately. Cranial nerves grossly intact. Skin: No rashes or petechiae noted. Psych: Normal affect.  LAB RESULTS:  Lab Results  Component Value Date   NA 138 01/01/2018   K 3.9 01/01/2018   CL 104 01/01/2018   CO2 25 01/01/2018   GLUCOSE 112 (H) 01/01/2018   BUN 14 01/01/2018   CREATININE 0.91 01/01/2018   CALCIUM 8.9 01/01/2018   PROT 7.6 01/01/2018   ALBUMIN 3.5 01/01/2018   AST 32 01/01/2018   ALT 27 01/01/2018   ALKPHOS 80 01/01/2018   BILITOT 0.3 01/01/2018   GFRNONAA >60 01/01/2018   GFRAA >60 01/01/2018    Lab Results  Component Value Date   WBC 8.2 01/01/2018   NEUTROABS 6.2 01/01/2018   HGB 11.8 (L) 01/01/2018   HCT 37.5 01/01/2018   MCV 88.9  01/01/2018   PLT 192 01/01/2018     STUDIES: Ct Chest W Contrast  Result Date: 12/25/2017 CLINICAL DATA:  Follow-up stage IV small cell lung cancer with hepatic and osseous metastatic disease. Chemotherapy completed earlier this year. No current complaints. EXAM: CT CHEST, ABDOMEN, AND PELVIS WITH CONTRAST TECHNIQUE: Multidetector CT imaging of the chest, abdomen and pelvis was performed following the standard protocol during bolus administration of intravenous contrast. CONTRAST:  12mL ISOVUE-300 IOPAMIDOL (ISOVUE-300) INJECTION 61% COMPARISON:  Prior CTs 09/29/2017 and 08/14/2017. FINDINGS: CT CHEST FINDINGS Cardiovascular: There is atherosclerosis of the aorta, great vessels and coronary arteries. There is occlusion of the left upper lobe pulmonary artery by recurrent tumor in the left suprahilar area. No acute vascular findings are seen. The heart size is normal. There is no pericardial effusion. Mediastinum/Nodes: There is recurrent, confluent adenopathy in the AP window. There is a 2.6 x 1.9 cm component medially on image 16/2. There is a 1.3 x 1.6 cm subcarinal node on image 22/2. There is a left hilar/suprahilar mass, measuring up to 2.9 x 4.7 cm on image 16/2. No axillary adenopathy. The thyroid gland, trachea and esophagus demonstrate no significant findings. Lungs/Pleura: There is no pleural effusion. As above, there is a recurrent left hilar mass with suprahilar extension. There is mild associated postobstructive pneumonitis in the left upper lobe which has increased. There is stable residual linear right lower lobe nodularity at the site of previously demonstrated metastases (axial images 63 and 82/4). Subpleural nodularity in the left lower lobe measuring 9 x 5 mm on image 87/4 stable. There are no other new or enlarging nodules. Mild underlying centrilobular emphysema. Musculoskeletal/Chest wall: There are stable sclerotic lesions within the thoracic vertebral bodies. Severe compression  deformity and osseous retropulsion at T5 is stable. There are stable mild compression deformities involving the inferior endplate of T4 and the superior endplate of T7. Incompletely healed fracture of the left 7th rib is stable. CT ABDOMEN AND PELVIS FINDINGS Hepatobiliary: Interval progression of multifocal hepatic metastatic disease. Lesions within the left lobe are confluent and difficult to measure, although measure approximately 6.9 x 5.0 cm on image 54/2 and 7.5 x 5.0 cm on  image 55/2. There is a 2.5 x 1.5 cm lesion in the right lobe on image 49/2. The gallbladder is incompletely distended. No evidence of gallstones, gallbladder wall thickening or significant biliary dilatation. Pancreas: Unremarkable. No pancreatic ductal dilatation or surrounding inflammatory changes. Spleen: Normal in size without focal abnormality. Adrenals/Urinary Tract: Both adrenal glands appear normal. The kidneys appear normal without evidence of urinary tract calculus, suspicious lesion or hydronephrosis. No bladder abnormalities are seen. Stomach/Bowel: No evidence of bowel wall thickening, distention or surrounding inflammatory change. There is moderate stool throughout the colon. There is mild fecalization of the distal small bowel without wall thickening or surrounding inflammation. There are mild diverticular changes in the distal colon. Vascular/Lymphatic: Porta hepatis and peripancreatic adenopathy has mildly progressed. There is a 12 mm portacaval node on image 58/2 and a 13 mm celiac node on image 55/2. No pelvic adenopathy. There is moderate aortic and branch vessel atherosclerosis without evidence of acute vascular findings. Reproductive: The uterus and ovaries appear normal. No adnexal mass. Other: Intact anterior abdominal wall. No ascites or peritoneal nodularity. Musculoskeletal: There are stable probable sclerotic metastases within the bony pelvis and lumbar spine. Most prominent lesion in the left iliac bone measures  1.7 cm on image 89/2. There are stable chronic superior endplate compression deformities at L1 and L5. There are chronic bilateral L4 pars fractures with an associated grade 1 anterolisthesis and biforaminal narrowing at L4-5. IMPRESSION: 1. Local thoracic recurrence with a left hilar/suprahilar mass and progressive mediastinal lymphadenopathy. 2. Significant progression in multifocal hepatic metastatic disease. Mildly enlarged lymph nodes in the upper abdomen are likely metastases as well. 3. No change in residual mild peripheral lung nodularity and osseous metastatic disease. 4. Stable thoracolumbar compression fractures. Electronically Signed   By: Richardean Sale M.D.   On: 12/25/2017 14:59   Ct Abdomen Pelvis W Contrast  Result Date: 12/25/2017 CLINICAL DATA:  Follow-up stage IV small cell lung cancer with hepatic and osseous metastatic disease. Chemotherapy completed earlier this year. No current complaints. EXAM: CT CHEST, ABDOMEN, AND PELVIS WITH CONTRAST TECHNIQUE: Multidetector CT imaging of the chest, abdomen and pelvis was performed following the standard protocol during bolus administration of intravenous contrast. CONTRAST:  197mL ISOVUE-300 IOPAMIDOL (ISOVUE-300) INJECTION 61% COMPARISON:  Prior CTs 09/29/2017 and 08/14/2017. FINDINGS: CT CHEST FINDINGS Cardiovascular: There is atherosclerosis of the aorta, great vessels and coronary arteries. There is occlusion of the left upper lobe pulmonary artery by recurrent tumor in the left suprahilar area. No acute vascular findings are seen. The heart size is normal. There is no pericardial effusion. Mediastinum/Nodes: There is recurrent, confluent adenopathy in the AP window. There is a 2.6 x 1.9 cm component medially on image 16/2. There is a 1.3 x 1.6 cm subcarinal node on image 22/2. There is a left hilar/suprahilar mass, measuring up to 2.9 x 4.7 cm on image 16/2. No axillary adenopathy. The thyroid gland, trachea and esophagus demonstrate no  significant findings. Lungs/Pleura: There is no pleural effusion. As above, there is a recurrent left hilar mass with suprahilar extension. There is mild associated postobstructive pneumonitis in the left upper lobe which has increased. There is stable residual linear right lower lobe nodularity at the site of previously demonstrated metastases (axial images 63 and 82/4). Subpleural nodularity in the left lower lobe measuring 9 x 5 mm on image 87/4 stable. There are no other new or enlarging nodules. Mild underlying centrilobular emphysema. Musculoskeletal/Chest wall: There are stable sclerotic lesions within the thoracic vertebral bodies. Severe compression deformity  and osseous retropulsion at T5 is stable. There are stable mild compression deformities involving the inferior endplate of T4 and the superior endplate of T7. Incompletely healed fracture of the left 7th rib is stable. CT ABDOMEN AND PELVIS FINDINGS Hepatobiliary: Interval progression of multifocal hepatic metastatic disease. Lesions within the left lobe are confluent and difficult to measure, although measure approximately 6.9 x 5.0 cm on image 54/2 and 7.5 x 5.0 cm on image 55/2. There is a 2.5 x 1.5 cm lesion in the right lobe on image 49/2. The gallbladder is incompletely distended. No evidence of gallstones, gallbladder wall thickening or significant biliary dilatation. Pancreas: Unremarkable. No pancreatic ductal dilatation or surrounding inflammatory changes. Spleen: Normal in size without focal abnormality. Adrenals/Urinary Tract: Both adrenal glands appear normal. The kidneys appear normal without evidence of urinary tract calculus, suspicious lesion or hydronephrosis. No bladder abnormalities are seen. Stomach/Bowel: No evidence of bowel wall thickening, distention or surrounding inflammatory change. There is moderate stool throughout the colon. There is mild fecalization of the distal small bowel without wall thickening or surrounding  inflammation. There are mild diverticular changes in the distal colon. Vascular/Lymphatic: Porta hepatis and peripancreatic adenopathy has mildly progressed. There is a 12 mm portacaval node on image 58/2 and a 13 mm celiac node on image 55/2. No pelvic adenopathy. There is moderate aortic and branch vessel atherosclerosis without evidence of acute vascular findings. Reproductive: The uterus and ovaries appear normal. No adnexal mass. Other: Intact anterior abdominal wall. No ascites or peritoneal nodularity. Musculoskeletal: There are stable probable sclerotic metastases within the bony pelvis and lumbar spine. Most prominent lesion in the left iliac bone measures 1.7 cm on image 89/2. There are stable chronic superior endplate compression deformities at L1 and L5. There are chronic bilateral L4 pars fractures with an associated grade 1 anterolisthesis and biforaminal narrowing at L4-5. IMPRESSION: 1. Local thoracic recurrence with a left hilar/suprahilar mass and progressive mediastinal lymphadenopathy. 2. Significant progression in multifocal hepatic metastatic disease. Mildly enlarged lymph nodes in the upper abdomen are likely metastases as well. 3. No change in residual mild peripheral lung nodularity and osseous metastatic disease. 4. Stable thoracolumbar compression fractures. Electronically Signed   By: Richardean Sale M.D.   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  PLAN:  1. 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  Patient then initiated maintenance Tecentriq on Jun 21, 2017.  CT scan results from December 25, 2017 reviewed independently and reported as above with progressive disease in her mediastinum as well as significant progression of multifocal hepatic disease.  Hospice was briefly  discussed, but patient wishes to continue aggressive treatment.  Proceed with cycle 1, day 1 of topotecan today.  Return to clinic Tuesday through Friday for treatment only.  Patient will then return to clinic in 1 week for laboratory work and then in 2 weeks for laboratory work and further evaluation.  This will be a 21-day cycle.  Plan to reimage after cycle 4.   2. Pain: Chronic and unchanged.  Patient is now seen in pain clinic.  States her narcotic has been switched to belbuca.  She was given a prescription for morphine today. 3.  Anemia: Hemoglobin decreased but stable, monitor. 4.  COPD: Chronic and unchanged.  Continue outpatient palliative care.  Continue current  medications as prescribed. 5.  Disposition: Hospice and end-of-life care were recently discussed, but patient wishes to continue aggressive treatments.  She expressed understanding that her treatment options are limited.  She confirms that she is a DNR and is having outpatient palliative care visit.   Patient expressed understanding and was in agreement with this plan. She also understands that She can call clinic at any time with any questions, concerns, or complaints.   Cancer Staging Small cell lung cancer [facility]) Staging form: Lung, AJCC 8th Edition - Clinical stage from 01/28/2017: Stage IV (cT4, cN3, pM1c) - Signed by Lloyd Huger, MD on 01/28/2017   Lloyd Huger, MD   01/01/2018 1:17 PM

## 2018-01-01 ENCOUNTER — Other Ambulatory Visit: Payer: Self-pay

## 2018-01-01 ENCOUNTER — Inpatient Hospital Stay (HOSPITAL_BASED_OUTPATIENT_CLINIC_OR_DEPARTMENT_OTHER): Payer: Medicare Other | Admitting: Oncology

## 2018-01-01 ENCOUNTER — Inpatient Hospital Stay: Payer: Medicare Other

## 2018-01-01 VITALS — BP 103/74 | HR 76 | Temp 94.9°F | Resp 18 | Wt 204.0 lb

## 2018-01-01 DIAGNOSIS — F418 Other specified anxiety disorders: Secondary | ICD-10-CM | POA: Diagnosis not present

## 2018-01-01 DIAGNOSIS — E785 Hyperlipidemia, unspecified: Secondary | ICD-10-CM | POA: Diagnosis not present

## 2018-01-01 DIAGNOSIS — K219 Gastro-esophageal reflux disease without esophagitis: Secondary | ICD-10-CM

## 2018-01-01 DIAGNOSIS — C787 Secondary malignant neoplasm of liver and intrahepatic bile duct: Secondary | ICD-10-CM | POA: Diagnosis not present

## 2018-01-01 DIAGNOSIS — I429 Cardiomyopathy, unspecified: Secondary | ICD-10-CM

## 2018-01-01 DIAGNOSIS — J449 Chronic obstructive pulmonary disease, unspecified: Secondary | ICD-10-CM | POA: Diagnosis not present

## 2018-01-01 DIAGNOSIS — Z5111 Encounter for antineoplastic chemotherapy: Secondary | ICD-10-CM | POA: Diagnosis not present

## 2018-01-01 DIAGNOSIS — I110 Hypertensive heart disease with heart failure: Secondary | ICD-10-CM

## 2018-01-01 DIAGNOSIS — C3412 Malignant neoplasm of upper lobe, left bronchus or lung: Secondary | ICD-10-CM

## 2018-01-01 DIAGNOSIS — Z79899 Other long term (current) drug therapy: Secondary | ICD-10-CM

## 2018-01-01 DIAGNOSIS — C7951 Secondary malignant neoplasm of bone: Secondary | ICD-10-CM | POA: Diagnosis not present

## 2018-01-01 DIAGNOSIS — C3490 Malignant neoplasm of unspecified part of unspecified bronchus or lung: Secondary | ICD-10-CM

## 2018-01-01 DIAGNOSIS — D509 Iron deficiency anemia, unspecified: Secondary | ICD-10-CM | POA: Diagnosis not present

## 2018-01-01 DIAGNOSIS — I509 Heart failure, unspecified: Secondary | ICD-10-CM

## 2018-01-01 DIAGNOSIS — Z87891 Personal history of nicotine dependence: Secondary | ICD-10-CM

## 2018-01-01 LAB — CBC WITH DIFFERENTIAL/PLATELET
ABS IMMATURE GRANULOCYTES: 0.03 10*3/uL (ref 0.00–0.07)
Basophils Absolute: 0 10*3/uL (ref 0.0–0.1)
Basophils Relative: 0 %
EOS PCT: 1 %
Eosinophils Absolute: 0.1 10*3/uL (ref 0.0–0.5)
HEMATOCRIT: 37.5 % (ref 36.0–46.0)
HEMOGLOBIN: 11.8 g/dL — AB (ref 12.0–15.0)
Immature Granulocytes: 0 %
LYMPHS ABS: 1.3 10*3/uL (ref 0.7–4.0)
LYMPHS PCT: 16 %
MCH: 28 pg (ref 26.0–34.0)
MCHC: 31.5 g/dL (ref 30.0–36.0)
MCV: 88.9 fL (ref 80.0–100.0)
MONO ABS: 0.5 10*3/uL (ref 0.1–1.0)
MONOS PCT: 6 %
NEUTROS ABS: 6.2 10*3/uL (ref 1.7–7.7)
Neutrophils Relative %: 77 %
Platelets: 192 10*3/uL (ref 150–400)
RBC: 4.22 MIL/uL (ref 3.87–5.11)
RDW: 13.8 % (ref 11.5–15.5)
WBC: 8.2 10*3/uL (ref 4.0–10.5)
nRBC: 0 % (ref 0.0–0.2)

## 2018-01-01 LAB — COMPREHENSIVE METABOLIC PANEL
ALBUMIN: 3.5 g/dL (ref 3.5–5.0)
ALK PHOS: 80 U/L (ref 38–126)
ALT: 27 U/L (ref 0–44)
AST: 32 U/L (ref 15–41)
Anion gap: 9 (ref 5–15)
BILIRUBIN TOTAL: 0.3 mg/dL (ref 0.3–1.2)
BUN: 14 mg/dL (ref 8–23)
CHLORIDE: 104 mmol/L (ref 98–111)
CO2: 25 mmol/L (ref 22–32)
CREATININE: 0.91 mg/dL (ref 0.44–1.00)
Calcium: 8.9 mg/dL (ref 8.9–10.3)
GFR calc Af Amer: >60 mL/min (ref >60)
GFR calc non Af Amer: >60 mL/min (ref >60)
GLUCOSE: 112 mg/dL — AB (ref 70–99)
POTASSIUM: 3.9 mmol/L (ref 3.5–5.1)
SODIUM: 138 mmol/L (ref 135–145)
Total Protein: 7.6 g/dL (ref 6.5–8.1)

## 2018-01-01 MED ORDER — HEPARIN SOD (PORK) LOCK FLUSH 100 UNIT/ML IV SOLN
500.0000 [IU] | Freq: Once | INTRAVENOUS | Status: AC | PRN
  Administered 2018-01-01: 500 [IU]
  Filled 2018-01-01: qty 5

## 2018-01-01 MED ORDER — TOPOTECAN HCL CHEMO INJECTION 4 MG
1.5000 mg/m2 | Freq: Once | INTRAVENOUS | Status: AC
  Administered 2018-01-01: 3.1 mg via INTRAVENOUS
  Filled 2018-01-01: qty 3.1

## 2018-01-01 MED ORDER — SODIUM CHLORIDE 0.9 % IV SOLN
Freq: Once | INTRAVENOUS | Status: AC
  Administered 2018-01-01: 11:00:00 via INTRAVENOUS
  Filled 2018-01-01: qty 250

## 2018-01-01 MED ORDER — PROCHLORPERAZINE MALEATE 10 MG PO TABS
10.0000 mg | ORAL_TABLET | Freq: Once | ORAL | Status: AC
  Administered 2018-01-01: 10 mg via ORAL
  Filled 2018-01-01: qty 1

## 2018-01-01 MED ORDER — SODIUM CHLORIDE 0.9% FLUSH
10.0000 mL | INTRAVENOUS | Status: DC | PRN
  Filled 2018-01-01: qty 10

## 2018-01-01 NOTE — Progress Notes (Signed)
Here for follow up. Overall doing well.

## 2018-01-02 ENCOUNTER — Other Ambulatory Visit: Payer: Self-pay | Admitting: *Deleted

## 2018-01-02 ENCOUNTER — Inpatient Hospital Stay: Payer: Medicare Other

## 2018-01-02 VITALS — BP 86/66 | HR 71 | Temp 95.1°F | Resp 18

## 2018-01-02 DIAGNOSIS — J449 Chronic obstructive pulmonary disease, unspecified: Secondary | ICD-10-CM | POA: Diagnosis not present

## 2018-01-02 DIAGNOSIS — Z5111 Encounter for antineoplastic chemotherapy: Secondary | ICD-10-CM | POA: Diagnosis not present

## 2018-01-02 DIAGNOSIS — C7951 Secondary malignant neoplasm of bone: Secondary | ICD-10-CM | POA: Diagnosis not present

## 2018-01-02 DIAGNOSIS — C787 Secondary malignant neoplasm of liver and intrahepatic bile duct: Secondary | ICD-10-CM | POA: Diagnosis not present

## 2018-01-02 DIAGNOSIS — C3490 Malignant neoplasm of unspecified part of unspecified bronchus or lung: Secondary | ICD-10-CM

## 2018-01-02 DIAGNOSIS — C3412 Malignant neoplasm of upper lobe, left bronchus or lung: Secondary | ICD-10-CM | POA: Diagnosis not present

## 2018-01-02 DIAGNOSIS — D509 Iron deficiency anemia, unspecified: Secondary | ICD-10-CM | POA: Diagnosis not present

## 2018-01-02 LAB — THYROID PANEL WITH TSH
Free Thyroxine Index: 2.1 (ref 1.2–4.9)
T3 UPTAKE RATIO: 22 % — AB (ref 24–39)
T4 TOTAL: 9.4 ug/dL (ref 4.5–12.0)
TSH: 10.3 u[IU]/mL — AB (ref 0.450–4.500)

## 2018-01-02 MED ORDER — HEPARIN SOD (PORK) LOCK FLUSH 100 UNIT/ML IV SOLN
500.0000 [IU] | Freq: Once | INTRAVENOUS | Status: AC | PRN
  Administered 2018-01-02: 500 [IU]
  Filled 2018-01-02: qty 5

## 2018-01-02 MED ORDER — SODIUM CHLORIDE 0.9 % IV SOLN
Freq: Once | INTRAVENOUS | Status: AC
  Administered 2018-01-02: 15:00:00 via INTRAVENOUS
  Filled 2018-01-02: qty 250

## 2018-01-02 MED ORDER — TOPOTECAN HCL CHEMO INJECTION 4 MG
1.5000 mg/m2 | Freq: Once | INTRAVENOUS | Status: AC
  Administered 2018-01-02: 3.1 mg via INTRAVENOUS
  Filled 2018-01-02: qty 3.1

## 2018-01-02 MED ORDER — PROCHLORPERAZINE MALEATE 10 MG PO TABS
10.0000 mg | ORAL_TABLET | Freq: Once | ORAL | Status: AC
  Administered 2018-01-02: 10 mg via ORAL
  Filled 2018-01-02: qty 1

## 2018-01-02 NOTE — Telephone Encounter (Signed)
Dr. Grayland Ormond did tell her we would refill at [place] clinic visit, Morphine 15 mg has been sent to him for approval.

## 2018-01-02 NOTE — Progress Notes (Signed)
B/P 89/66 pt denies any symptoms and states she feels at baseline. Pt reports that she took a "whole one" of her B/P medications instead of "half" MD aware, okay to proceed with treatment and pt to receive 1 liter NS over one hour.

## 2018-01-02 NOTE — Telephone Encounter (Signed)
Patient asking for a refill of her MS ER 15 mg every 8 hours This medicine has been discontinued from her medicine list as she was not taking it per documentation. Per office note 01/01/18 prescription for MS was to be given , but I do not see it on her Med list. 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  PLAN:  1. 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  Patient then initiated maintenance Tecentriq on Jun 21, 2017.  CT scan results from December 25, 2017 reviewed independently and reported as above with progressive disease in her mediastinum as well as significant progression of multifocal hepatic disease.  Hospice was briefly discussed, but patient wishes to continue aggressive treatment.  Proceed with cycle 1, day 1 of topotecan today.  Return to clinic Tuesday through Friday for treatment only.  Patient will then return to clinic in 1 week for laboratory work and then in 2 weeks for laboratory work and further evaluation.  This will be a 21-day cycle.  Plan to reimage after cycle 4.   2. Pain: Chronic and unchanged.  Patient is now seen in pain clinic.  States her narcotic has been switched to belbuca.  She was given a prescription for morphine today. 3.  Anemia: Hemoglobin decreased but stable, monitor. 4.  COPD: Chronic and unchanged.  Continue outpatient palliative care.  Continue current medications as prescribed. 5.  Disposition: Hospice and end-of-life care were recently discussed, but patient wishes to continue aggressive treatments.  She expressed understanding that her treatment options are limited.  She confirms that she is a DNR and is having outpatient palliative care visit.   Patient expressed understanding and was in agreement with this plan. She also understands that She can call  clinic at any time with any questions, concerns, or complaints.   Cancer Staging Small cell lung cancer [facility]) Staging form: Lung, AJCC 8th Edition - Clinical stage from 01/28/2017: Stage IV (cT4, cN3, pM1c) - Signed by Lloyd Huger, MD on 01/28/2017   Lloyd Huger, MD   01/01/2018 1:17 PM

## 2018-01-03 ENCOUNTER — Inpatient Hospital Stay: Payer: Medicare Other

## 2018-01-03 VITALS — BP 101/69 | HR 74 | Temp 96.1°F | Resp 20

## 2018-01-03 DIAGNOSIS — M50122 Cervical disc disorder at C5-C6 level with radiculopathy: Secondary | ICD-10-CM | POA: Diagnosis not present

## 2018-01-03 DIAGNOSIS — C7951 Secondary malignant neoplasm of bone: Secondary | ICD-10-CM | POA: Diagnosis not present

## 2018-01-03 DIAGNOSIS — M9901 Segmental and somatic dysfunction of cervical region: Secondary | ICD-10-CM | POA: Diagnosis not present

## 2018-01-03 DIAGNOSIS — C3490 Malignant neoplasm of unspecified part of unspecified bronchus or lung: Secondary | ICD-10-CM

## 2018-01-03 DIAGNOSIS — C3412 Malignant neoplasm of upper lobe, left bronchus or lung: Secondary | ICD-10-CM | POA: Diagnosis not present

## 2018-01-03 DIAGNOSIS — J449 Chronic obstructive pulmonary disease, unspecified: Secondary | ICD-10-CM | POA: Diagnosis not present

## 2018-01-03 DIAGNOSIS — M9903 Segmental and somatic dysfunction of lumbar region: Secondary | ICD-10-CM | POA: Diagnosis not present

## 2018-01-03 DIAGNOSIS — Z5111 Encounter for antineoplastic chemotherapy: Secondary | ICD-10-CM | POA: Diagnosis not present

## 2018-01-03 DIAGNOSIS — M5417 Radiculopathy, lumbosacral region: Secondary | ICD-10-CM | POA: Diagnosis not present

## 2018-01-03 DIAGNOSIS — C787 Secondary malignant neoplasm of liver and intrahepatic bile duct: Secondary | ICD-10-CM | POA: Diagnosis not present

## 2018-01-03 DIAGNOSIS — D509 Iron deficiency anemia, unspecified: Secondary | ICD-10-CM | POA: Diagnosis not present

## 2018-01-03 MED ORDER — SODIUM CHLORIDE 0.9 % IV SOLN
Freq: Once | INTRAVENOUS | Status: AC
  Administered 2018-01-03: 09:00:00 via INTRAVENOUS
  Filled 2018-01-03: qty 250

## 2018-01-03 MED ORDER — PROCHLORPERAZINE MALEATE 10 MG PO TABS
10.0000 mg | ORAL_TABLET | Freq: Once | ORAL | Status: AC
  Administered 2018-01-03: 10 mg via ORAL
  Filled 2018-01-03: qty 1

## 2018-01-03 MED ORDER — MORPHINE SULFATE ER 15 MG PO TBCR: 15.0000 mg | EXTENDED_RELEASE_TABLET | Freq: Two times a day (BID) | ORAL | 0 refills | Status: DC

## 2018-01-03 MED ORDER — TOPOTECAN HCL CHEMO INJECTION 4 MG
1.5000 mg/m2 | Freq: Once | INTRAVENOUS | Status: AC
  Administered 2018-01-03: 3.1 mg via INTRAVENOUS
  Filled 2018-01-03: qty 3.1

## 2018-01-03 MED ORDER — HEPARIN SOD (PORK) LOCK FLUSH 100 UNIT/ML IV SOLN
500.0000 [IU] | Freq: Once | INTRAVENOUS | Status: AC | PRN
  Administered 2018-01-03: 500 [IU]
  Filled 2018-01-03: qty 5

## 2018-01-04 ENCOUNTER — Inpatient Hospital Stay: Payer: Medicare Other

## 2018-01-04 VITALS — BP 111/75 | HR 79 | Temp 96.9°F | Resp 20

## 2018-01-04 DIAGNOSIS — C3412 Malignant neoplasm of upper lobe, left bronchus or lung: Secondary | ICD-10-CM | POA: Diagnosis not present

## 2018-01-04 DIAGNOSIS — D509 Iron deficiency anemia, unspecified: Secondary | ICD-10-CM | POA: Diagnosis not present

## 2018-01-04 DIAGNOSIS — C3490 Malignant neoplasm of unspecified part of unspecified bronchus or lung: Secondary | ICD-10-CM

## 2018-01-04 DIAGNOSIS — C7951 Secondary malignant neoplasm of bone: Secondary | ICD-10-CM | POA: Diagnosis not present

## 2018-01-04 DIAGNOSIS — J449 Chronic obstructive pulmonary disease, unspecified: Secondary | ICD-10-CM | POA: Diagnosis not present

## 2018-01-04 DIAGNOSIS — C787 Secondary malignant neoplasm of liver and intrahepatic bile duct: Secondary | ICD-10-CM | POA: Diagnosis not present

## 2018-01-04 DIAGNOSIS — Z5111 Encounter for antineoplastic chemotherapy: Secondary | ICD-10-CM | POA: Diagnosis not present

## 2018-01-04 MED ORDER — HEPARIN SOD (PORK) LOCK FLUSH 100 UNIT/ML IV SOLN
INTRAVENOUS | Status: AC
  Filled 2018-01-04: qty 5

## 2018-01-04 MED ORDER — TOPOTECAN HCL CHEMO INJECTION 4 MG
1.5000 mg/m2 | Freq: Once | INTRAVENOUS | Status: AC
  Administered 2018-01-04: 3.1 mg via INTRAVENOUS
  Filled 2018-01-04: qty 3.1

## 2018-01-04 MED ORDER — SODIUM CHLORIDE 0.9% FLUSH
10.0000 mL | INTRAVENOUS | Status: DC | PRN
  Administered 2018-01-04: 10 mL via INTRAVENOUS
  Filled 2018-01-04: qty 10

## 2018-01-04 MED ORDER — HEPARIN SOD (PORK) LOCK FLUSH 100 UNIT/ML IV SOLN
500.0000 [IU] | Freq: Once | INTRAVENOUS | Status: AC
  Administered 2018-01-04: 500 [IU] via INTRAVENOUS

## 2018-01-04 MED ORDER — PROCHLORPERAZINE MALEATE 10 MG PO TABS
10.0000 mg | ORAL_TABLET | Freq: Once | ORAL | Status: AC
  Administered 2018-01-04: 10 mg via ORAL
  Filled 2018-01-04: qty 1

## 2018-01-04 MED ORDER — SODIUM CHLORIDE 0.9 % IV SOLN
Freq: Once | INTRAVENOUS | Status: AC
  Administered 2018-01-04: 09:00:00 via INTRAVENOUS
  Filled 2018-01-04: qty 250

## 2018-01-05 ENCOUNTER — Inpatient Hospital Stay: Payer: Medicare Other

## 2018-01-05 VITALS — BP 115/80 | HR 93 | Temp 96.2°F | Resp 20

## 2018-01-05 DIAGNOSIS — Z5111 Encounter for antineoplastic chemotherapy: Secondary | ICD-10-CM | POA: Diagnosis not present

## 2018-01-05 DIAGNOSIS — C787 Secondary malignant neoplasm of liver and intrahepatic bile duct: Secondary | ICD-10-CM | POA: Diagnosis not present

## 2018-01-05 DIAGNOSIS — C7951 Secondary malignant neoplasm of bone: Secondary | ICD-10-CM | POA: Diagnosis not present

## 2018-01-05 DIAGNOSIS — C3412 Malignant neoplasm of upper lobe, left bronchus or lung: Secondary | ICD-10-CM | POA: Diagnosis not present

## 2018-01-05 DIAGNOSIS — J449 Chronic obstructive pulmonary disease, unspecified: Secondary | ICD-10-CM | POA: Diagnosis not present

## 2018-01-05 DIAGNOSIS — C3490 Malignant neoplasm of unspecified part of unspecified bronchus or lung: Secondary | ICD-10-CM

## 2018-01-05 DIAGNOSIS — D509 Iron deficiency anemia, unspecified: Secondary | ICD-10-CM | POA: Diagnosis not present

## 2018-01-05 MED ORDER — PROCHLORPERAZINE MALEATE 10 MG PO TABS
10.0000 mg | ORAL_TABLET | Freq: Once | ORAL | Status: AC
  Administered 2018-01-05: 10 mg via ORAL
  Filled 2018-01-05: qty 1

## 2018-01-05 MED ORDER — TOPOTECAN HCL CHEMO INJECTION 4 MG
1.5000 mg/m2 | Freq: Once | INTRAVENOUS | Status: AC
  Administered 2018-01-05: 3.1 mg via INTRAVENOUS
  Filled 2018-01-05: qty 3.1

## 2018-01-05 MED ORDER — SODIUM CHLORIDE 0.9 % IV SOLN
Freq: Once | INTRAVENOUS | Status: AC
  Administered 2018-01-05: 09:00:00 via INTRAVENOUS
  Filled 2018-01-05: qty 250

## 2018-01-05 MED ORDER — HEPARIN SOD (PORK) LOCK FLUSH 100 UNIT/ML IV SOLN
500.0000 [IU] | Freq: Once | INTRAVENOUS | Status: AC | PRN
  Administered 2018-01-05: 500 [IU]
  Filled 2018-01-05: qty 5

## 2018-01-08 ENCOUNTER — Other Ambulatory Visit: Payer: Self-pay

## 2018-01-08 ENCOUNTER — Encounter (INDEPENDENT_AMBULATORY_CARE_PROVIDER_SITE_OTHER): Payer: Self-pay | Admitting: Orthopaedic Surgery

## 2018-01-08 ENCOUNTER — Ambulatory Visit (INDEPENDENT_AMBULATORY_CARE_PROVIDER_SITE_OTHER): Payer: Medicare Other | Admitting: Orthopaedic Surgery

## 2018-01-08 ENCOUNTER — Inpatient Hospital Stay: Payer: Medicare Other

## 2018-01-08 VITALS — BP 111/77 | HR 76 | Ht 65.0 in | Wt 204.0 lb

## 2018-01-08 DIAGNOSIS — M25512 Pain in left shoulder: Secondary | ICD-10-CM

## 2018-01-08 DIAGNOSIS — C3490 Malignant neoplasm of unspecified part of unspecified bronchus or lung: Secondary | ICD-10-CM

## 2018-01-08 DIAGNOSIS — C7951 Secondary malignant neoplasm of bone: Secondary | ICD-10-CM | POA: Diagnosis not present

## 2018-01-08 DIAGNOSIS — Z5111 Encounter for antineoplastic chemotherapy: Secondary | ICD-10-CM | POA: Diagnosis not present

## 2018-01-08 DIAGNOSIS — D509 Iron deficiency anemia, unspecified: Secondary | ICD-10-CM | POA: Diagnosis not present

## 2018-01-08 DIAGNOSIS — C3412 Malignant neoplasm of upper lobe, left bronchus or lung: Secondary | ICD-10-CM | POA: Diagnosis not present

## 2018-01-08 DIAGNOSIS — M25511 Pain in right shoulder: Secondary | ICD-10-CM | POA: Diagnosis not present

## 2018-01-08 DIAGNOSIS — G8929 Other chronic pain: Secondary | ICD-10-CM | POA: Diagnosis not present

## 2018-01-08 DIAGNOSIS — C787 Secondary malignant neoplasm of liver and intrahepatic bile duct: Secondary | ICD-10-CM | POA: Diagnosis not present

## 2018-01-08 DIAGNOSIS — J449 Chronic obstructive pulmonary disease, unspecified: Secondary | ICD-10-CM | POA: Diagnosis not present

## 2018-01-08 LAB — CBC WITH DIFFERENTIAL/PLATELET
Abs Immature Granulocytes: 0.09 10*3/uL — ABNORMAL HIGH (ref 0.00–0.07)
Basophils Absolute: 0 10*3/uL (ref 0.0–0.1)
Basophils Relative: 2 %
Eosinophils Absolute: 0 10*3/uL (ref 0.0–0.5)
Eosinophils Relative: 1 %
HCT: 35.1 % — ABNORMAL LOW (ref 36.0–46.0)
HEMOGLOBIN: 11.1 g/dL — AB (ref 12.0–15.0)
Immature Granulocytes: 5 %
Lymphocytes Relative: 59 %
Lymphs Abs: 1.1 10*3/uL (ref 0.7–4.0)
MCH: 27.9 pg (ref 26.0–34.0)
MCHC: 31.6 g/dL (ref 30.0–36.0)
MCV: 88.2 fL (ref 80.0–100.0)
MONO ABS: 0 10*3/uL — AB (ref 0.1–1.0)
Monocytes Relative: 2 %
Neutro Abs: 0.6 10*3/uL — ABNORMAL LOW (ref 1.7–7.7)
Neutrophils Relative %: 31 %
Platelets: 84 10*3/uL — ABNORMAL LOW (ref 150–400)
RBC: 3.98 MIL/uL (ref 3.87–5.11)
RDW: 13.2 % (ref 11.5–15.5)
Smear Review: DECREASED
WBC: 1.8 10*3/uL — AB (ref 4.0–10.5)
nRBC: 0 % (ref 0.0–0.2)

## 2018-01-08 LAB — COMPREHENSIVE METABOLIC PANEL
ALT: 34 U/L (ref 0–44)
AST: 35 U/L (ref 15–41)
Albumin: 3.4 g/dL — ABNORMAL LOW (ref 3.5–5.0)
Alkaline Phosphatase: 73 U/L (ref 38–126)
Anion gap: 6 (ref 5–15)
BUN: 17 mg/dL (ref 8–23)
CHLORIDE: 103 mmol/L (ref 98–111)
CO2: 28 mmol/L (ref 22–32)
Calcium: 8.9 mg/dL (ref 8.9–10.3)
Creatinine, Ser: 0.8 mg/dL (ref 0.44–1.00)
GFR calc Af Amer: >60 mL/min (ref >60)
GLUCOSE: 113 mg/dL — AB (ref 70–99)
POTASSIUM: 3.9 mmol/L (ref 3.5–5.1)
SODIUM: 137 mmol/L (ref 135–145)
Total Bilirubin: 0.5 mg/dL (ref 0.3–1.2)
Total Protein: 7.6 g/dL (ref 6.5–8.1)

## 2018-01-08 NOTE — Progress Notes (Signed)
Office Visit Note   Patient: Grace [name]           Date of Birth: 05/10/1950           MRN: [id number] Visit Date: 01/08/2018              Requested by: Grace [name], [address], [place] [address] PCP: Grace Pink, MD   Assessment & Plan: Visit Diagnoses:  1. Chronic pain of both shoulders     Plan: Bilateral shoulder osteoarthritis left greater than right by film.  Grace [name] has stage IV lung cancer and is in the midst of active treatment.  She is not interested in any surgery.  She has had intra-articular cortisone injections per her pain clinic physician.  I would suggest a course of therapy at [facility] in [place] where she lives.  Follow-Up Instructions: Return if symptoms worsen or fail to improve.   Orders:  No orders of the defined types were placed in this encounter.  No orders of the defined types were placed in this encounter.     Procedures: No procedures performed   Clinical Data: No additional findings.   Subjective: Chief Complaint  Patient presents with  . Right Shoulder - Pain  . Left Shoulder - Pain  Patient presents with bilateral shoulder pain. She states that the pain is not as bad recently because she has had recent bilateral shoulder injections with relief. She does have decreased range of motion with both shoulders. She denies one shoulder being worse than the other and states they seem to "take turns".  She has had plain film x-rays done. She takes morphine daily for chronic pain.   Grace [name] is 67 years old and accompanied by her daughter and here for evaluation of bilateral shoulder pain.  She has been diagnosed with osteoarthritis and has had several cortisone injections over the last year or 2 on each occasion they provide significant relief of her pain.  She also has stage IV lung cancer being followed by her oncologist and also pain clinic physician.  She does take morphine.  She is  had some difficulty of placing her arms behind her back.  On occasion she has more trouble with overhead motion and pain.  Presently she is fairly comfortable.  She is referred for further evaluation and treatment options.  I did review the films of both of her shoulders from 2018.  There are significant degenerative changes bilaterally but worse left than right.  I think the humeral heads are centered about the glenoid so I suspect that her rotator cuff might be intact bilaterally at least based on the films from 2018 HPI  Review of Systems   Objective: Vital Signs: BP 111/77   Pulse 76   Ht 5\' 5"  (1.651 m)   Wt 204 lb (92.5 kg)   BMI 33.95 kg/m   Physical Exam  Constitutional: She is oriented to person, place, and time. She appears well-developed and well-nourished.  HENT:  Mouth/Throat: Oropharynx is clear and moist.  Eyes: Pupils are equal, round, and reactive to light. EOM are normal.  Pulmonary/Chest: Effort normal.  Neurological: She is alert and oriented to person, place, and time.  Skin: Skin is warm and dry.  Psychiatric: She has a normal mood and affect. Her behavior is normal.    Ortho Exam awake alert and oriented x3.  Evaluated in wheelchair.  She actually had quite good range of motion of both of  her shoulders with almost full overhead motion.  She could not scratch the middle of her back with either upper extremity.  Good grip and good release.  Little bit of crepitation with motion more on the left than the right but no significant pain.  Skin intact good grip and good release.  Specialty Comments:  No specialty comments available.  Imaging: No results found.   PMFS History: Patient Active Problem List   Diagnosis Date Noted  . Chronic musculoskeletal pain 12/18/2017  . Arthropathy/Arthralgia of the shoulder (Bilateral) (R>L) 11/28/2017  . COPD (chronic obstructive pulmonary disease) ([employer]) 11/07/2017  . Osteoarthritis of acromioclavicular joint (Left)  11/07/2017  . Osteoarthritis of shoulders (Bilateral) 11/07/2017  . Osteoarthritis of acromioclavicular joints (Bilateral) 11/07/2017  . Osteoarthritis glenohumeral joints (Bilateral) 11/07/2017  . Dehydration 08/14/2017  . Hyponatremia 04/04/2017  . 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 Liver mass 01/10/2017  . Chronic acromioclavicular pain (Right) 12/27/2016  . Osteoarthritis of acromioclavicular joint (Right) 12/27/2016  . Osteoarthritis glenohumeral joint (Left) 12/27/2016  . Medial epicondylitis of elbow (Right) 12/27/2016  . Chronic shoulder pain (Bilateral) (R>L) 12/26/2016  . Chronic elbow pain (Right) 12/26/2016  . Long term prescription benzodiazepine use 12/07/2016  . Long term prescription opiate use 12/07/2016  . Opiate use 12/07/2016  . NSAID long-term use 12/07/2016  . Osteoarthritis of knees (Bilateral) 11/22/2016  . Thoracic compression fracture (T4, T5, T6, and T7) 11/22/2016  . Breast cyst 11/21/2016  . Cardiomyopathy ([place]) 11/21/2016  . Diabetes mellitus type 2, uncomplicated ([place]) 27/78/2423  . Fibroids 11/21/2016  . Hidradenitis 11/21/2016  . Nicotine abuse 11/21/2016  . Osteoarthritis cervical spine 11/21/2016  . Osteoarthritis of hands (Bilateral) 11/21/2016  . PUD (peptic ulcer disease) 11/21/2016  . Pulmonary nodule 11/21/2016  . Chronic knee pain (Primary Area of Pain) (Bilateral) 11/21/2016  . Chronic pain syndrome 11/21/2016  . Lumbar stenosis with neurogenic claudication 08/01/2016  . Systolic dysfunction with acute on chronic heart failure ([place])   . CHF (congestive heart failure), NYHA class I ([place]) 08/22/2014  . GERD (gastroesophageal reflux disease) 06/30/2014  . Depression 06/30/2014  . PVC's (premature ventricular contractions) 05/08/2014  . 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 Obesity 05/08/2014  . Tachycardia 05/08/2014  . Congestive dilated cardiomyopathy ([place])  05/08/2014   Past Medical History:  Diagnosis Date  . Anxiety   . Arthritis   . Asthma    COPD  . Cancer ([place])    Liver  . CAP (community acquired pneumonia) 06/30/2014  . Cardiomyopathy ([place])    nonischemic (EF 35-40%)  . CHF (congestive heart failure) ([place])   . COPD (chronic obstructive pulmonary disease) ([place])   . Depression   . Dyspnea   . GERD (gastroesophageal reflux disease)   . Hyperlipidemia   . Hypertension   . Pneumonia   . PVC's (premature ventricular contractions)    states 10 years ago  . Sepsis ([place]) 06/30/2014  . SOB (shortness of breath) 05/08/2014  . Spinal cord injury at T7-T12 level [facility])    2016    Family History  Problem Relation Age of Onset  . Heart attack Father 91       MI x 3     Past Surgical History:  Procedure Laterality Date  . APPENDECTOMY    . CARDIAC CATHETERIZATION    . CATARACT EXTRACTION     right eye   . CESAREAN SECTION    . COLONOSCOPY    . LAMINOTOMY    .  LUMBAR LAMINECTOMY/DECOMPRESSION MICRODISCECTOMY Left 08/01/2016   Procedure: Left Lumbar Four-Five Laminectomy and Foraminotomy;  Surgeon: Earnie Larsson, MD;  Location: [facility];  Service: Neurosurgery;  Laterality: Left;  . PORTA CATH INSERTION N/A 02/03/2017   Procedure: PORTA CATH INSERTION;  Surgeon: Katha Cabal, MD;  Location: [facility];  Service: Cardiovascular;  Laterality: N/A;   Social History   Occupational History  . Not on file  Tobacco Use  . Smoking status: Former Smoker    Packs/day: 0.50    Years: 50.00    Pack years: 25.00    Types: Cigarettes    Last attempt to quit: 01/27/2015    Years since quitting: 2.9  . Smokeless tobacco: Never Used  Substance and Sexual Activity  . Alcohol use: No  . Drug use: No  . Sexual activity: Not Currently

## 2018-01-09 LAB — THYROID PANEL WITH TSH
Free Thyroxine Index: 2 (ref 1.2–4.9)
T3 Uptake Ratio: 21 % — ABNORMAL LOW (ref 24–39)
T4 TOTAL: 9.7 ug/dL (ref 4.5–12.0)
TSH: 8.79 u[IU]/mL — ABNORMAL HIGH (ref 0.450–4.500)

## 2018-01-14 NOTE — Progress Notes (Signed)
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  INTERVAL HISTORY: Patient returns to clinic today for further evaluation and to assess her toleration of cycle 1 of topotecan.  She continues to have chronic weakness and fatigue, but otherwise tolerated her treatments well.  Pain is chronic and unchanged, but well controlled with her current narcotic regimen.  She has no neurologic complaints.  She denies any recent fevers or illnesses.  She has a good appetite and denies weight loss.  She has chronic shortness of breath, but denies any chest pain or hemoptysis. She has no abdominal pain.  She denies any melena or hematochezia.  She has no urinary complaints.  Patient offers no further specific complaints today.  REVIEW OF SYSTEMS:   Review of Systems  Constitutional: Positive for malaise/fatigue. Negative for fever and weight loss.  Respiratory: Positive for shortness of breath. Negative for cough, hemoptysis and wheezing.   Cardiovascular: Negative.  Negative for chest pain and leg swelling.  Gastrointestinal: Negative.  Negative for abdominal pain, blood in stool, diarrhea, melena, nausea and vomiting.  Genitourinary: Negative.  Negative for dysuria and frequency.  Musculoskeletal: Positive for back pain, joint pain and neck pain.  Skin: Negative.  Negative for rash.  Neurological: Positive for weakness. Negative for sensory change, focal weakness and headaches.  Psychiatric/Behavioral: Negative.  The patient is not nervous/anxious.     As per HPI. Otherwise, a complete review of systems is negative.  PAST MEDICAL HISTORY: Past Medical History:  Diagnosis Date  . Anxiety   . Arthritis   .  Asthma    COPD  . Cancer ([place])    Liver  . CAP (community acquired pneumonia) 06/30/2014  . Cardiomyopathy ([place])    nonischemic (EF 35-40%)  . CHF (congestive heart failure) ([place])   . COPD (chronic obstructive pulmonary disease) ([place])   . Depression   . Dyspnea   . GERD (gastroesophageal reflux disease)   . Hyperlipidemia   . Hypertension   . Pneumonia   . PVC's (premature ventricular contractions)    states 10 years ago  . Sepsis ([place]) 06/30/2014  . SOB (shortness of breath) 05/08/2014  . Spinal cord injury at T7-T12 level [facility])    2016    PAST SURGICAL HISTORY: Past Surgical History:  Procedure Laterality Date  . APPENDECTOMY    . CARDIAC CATHETERIZATION    . CATARACT EXTRACTION     right eye   . CESAREAN SECTION    . COLONOSCOPY    . LAMINOTOMY    . LUMBAR LAMINECTOMY/DECOMPRESSION MICRODISCECTOMY Left 08/01/2016   Procedure: Left Lumbar Four-Five Laminectomy and Foraminotomy;  Surgeon: Earnie Larsson, MD;  Location: [facility];  Service: Neurosurgery;  Laterality: Left;  . PORTA CATH INSERTION N/A 02/03/2017   Procedure: PORTA CATH INSERTION;  Surgeon: Katha Cabal, MD;  Location: [facility];  Service: Cardiovascular;  Laterality: N/A;    FAMILY HISTORY: Family History  Problem Relation Age of Onset  . Heart attack Father 57       MI x 3     ADVANCED DIRECTIVES (Y/N):  N  HEALTH MAINTENANCE: Social History   Tobacco Use  . Smoking status: Former Smoker    Packs/day: 0.50  Years: 50.00    Pack years: 25.00    Types: Cigarettes    Last attempt to quit: 01/27/2015    Years since quitting: 2.9  . Smokeless tobacco: Never Used  Substance Use Topics  . Alcohol use: No  . Drug use: No     Colonoscopy:  PAP:  Bone density:  Lipid panel:  Allergies  Allergen Reactions  . Fish Allergy Anaphylaxis    Per pt after eating [facility] she had throat swelling and SOB. MSY   . Fentanyl     Overly sedated and groggy, ineffective     Current  Outpatient Medications  Medication Sig Dispense Refill  . ADVAIR DISKUS 250-50 MCG/DOSE AEPB Inhale 1 puff into the lungs 2 (two) times daily.    Marland Kitchen albuterol (PROAIR HFA) 108 (90 Base) MCG/ACT inhaler Inhale 2 puffs into the lungs every 6 (six) hours as needed for wheezing or shortness of breath.     Marland Kitchen alendronate (FOSAMAX) 70 MG tablet Take 70 mg by mouth every Thursday.     . ARIPiprazole (ABILIFY) 5 MG tablet Take 5 mg by mouth daily.    Marland Kitchen BELBUCA 150 MCG FILM Take 1 tablet by mouth daily.  0  . DULoxetine (CYMBALTA) 60 MG capsule Take 60 mg by mouth daily.    Marland Kitchen ipratropium-albuterol (DUONEB) 0.5-2.5 (3) MG/3ML SOLN Take 3 mLs by nebulization every 4 (four) hours as needed. 360 mL 1  . LORazepam (ATIVAN) 0.5 MG tablet Take 0.5 mg by mouth as needed.    . metoprolol succinate (TOPROL-XL) 50 MG 24 hr tablet Take 1 tablet (50 mg total) by mouth daily. Take with or immediately following a meal. 180 tablet 3  . morphine (MS CONTIN) 15 MG 12 hr tablet Take 1 tablet (15 mg total) by mouth every 12 (twelve) hours. 60 tablet 0  . pantoprazole (PROTONIX) 40 MG tablet Take 1 tablet by mouth daily.    . potassium chloride SA (K-DUR,KLOR-CON) 20 MEQ tablet Take 1 tablet (20 mEq total) by mouth daily. 30 tablet 2  . sacubitril-valsartan (ENTRESTO) 24-26 MG Take 1 tablet by mouth 2 (two) times daily. 180 tablet 3  . benzonatate (TESSALON) 100 MG capsule Take 1 capsule (100 mg total) by mouth every 8 (eight) hours as needed for cough. (Patient not taking: Reported on 01/01/2018) 20 capsule 0  . diphenoxylate-atropine (LOMOTIL) 2.5-0.025 MG tablet Take 1 tablet by mouth 4 (four) times daily as needed for diarrhea or loose stools. (Patient not taking: Reported on 01/01/2018) 30 tablet 0  . prochlorperazine (COMPAZINE) 10 MG tablet Take 1 tablet (10 mg total) by mouth every 6 (six) hours as needed (Nausea or vomiting). (Patient not taking: Reported on 01/01/2018) 60 tablet 2  . promethazine (PHENERGAN) 25 MG  tablet Take 1 tablet (25 mg total) by mouth every 8 (eight) hours as needed for nausea or vomiting. (Patient not taking: Reported on 01/01/2018) 30 tablet 2  . tiZANidine (ZANAFLEX) 4 MG tablet Take 1 tablet (4 mg total) by mouth every 8 (eight) hours as needed for muscle spasms. (Patient not taking: Reported on 01/01/2018) 90 tablet 2   No current facility-administered medications for this visit.    Facility-Administered Medications Ordered in Other Visits  Medication Dose Route Frequency Provider Last Rate Last Dose  . sodium chloride flush (NS) 0.9 % injection 10 mL  10 mL Intravenous PRN Lloyd Huger, MD   10 mL at 03/01/17 0841    OBJECTIVE: Vitals:   01/15/18 1136  BP: 119/78  Pulse: 96  Resp: 18  Temp: 98.5 F (36.9 C)     There is no height or weight on file to calculate BMI.    ECOG FS:1 - Symptomatic but completely ambulatory  General: Well-developed, well-nourished, no acute distress.  Sitting in a wheelchair. Eyes: Pink conjunctiva, anicteric sclera. HEENT: Normocephalic, moist mucous membranes. Lungs: Clear to auscultation bilaterally. Heart: Regular rate and rhythm. No rubs, murmurs, or gallops. Abdomen: Soft, nontender, nondistended. No organomegaly noted, normoactive bowel sounds. Musculoskeletal: No edema, cyanosis, or clubbing. Neuro: Alert, answering all questions appropriately. Cranial nerves grossly intact. Skin: No rashes or petechiae noted. Psych: Normal affect.  LAB RESULTS:  Lab Results  Component Value Date   NA 135 01/15/2018   K 3.9 01/15/2018   CL 100 01/15/2018   CO2 28 01/15/2018   GLUCOSE 118 (H) 01/15/2018   BUN 15 01/15/2018   CREATININE 0.89 01/15/2018   CALCIUM 8.6 (L) 01/15/2018   PROT 7.6 01/08/2018   ALBUMIN 3.4 (L) 01/08/2018   AST 35 01/08/2018   ALT 34 01/08/2018   ALKPHOS 73 01/08/2018   BILITOT 0.5 01/08/2018   GFRNONAA >60 01/15/2018   GFRAA >60 01/15/2018    Lab Results  Component Value Date   WBC 1.0 (LL)  01/15/2018   NEUTROABS 0.2 (L) 01/15/2018   HGB 9.2 (L) 01/15/2018   HCT 28.8 (L) 01/15/2018   MCV 86.7 01/15/2018   PLT 7 (LL) 01/15/2018     STUDIES: Ct Chest W Contrast  Result Date: 12/25/2017 CLINICAL DATA:  Follow-up stage IV small cell lung cancer with hepatic and osseous metastatic disease. Chemotherapy completed earlier this year. No current complaints. EXAM: CT CHEST, ABDOMEN, AND PELVIS WITH CONTRAST TECHNIQUE: Multidetector CT imaging of the chest, abdomen and pelvis was performed following the standard protocol during bolus administration of intravenous contrast. CONTRAST:  144mL ISOVUE-300 IOPAMIDOL (ISOVUE-300) INJECTION 61% COMPARISON:  Prior CTs 09/29/2017 and 08/14/2017. FINDINGS: CT CHEST FINDINGS Cardiovascular: There is atherosclerosis of the aorta, great vessels and coronary arteries. There is occlusion of the left upper lobe pulmonary artery by recurrent tumor in the left suprahilar area. No acute vascular findings are seen. The heart size is normal. There is no pericardial effusion. Mediastinum/Nodes: There is recurrent, confluent adenopathy in the AP window. There is a 2.6 x 1.9 cm component medially on image 16/2. There is a 1.3 x 1.6 cm subcarinal node on image 22/2. There is a left hilar/suprahilar mass, measuring up to 2.9 x 4.7 cm on image 16/2. No axillary adenopathy. The thyroid gland, trachea and esophagus demonstrate no significant findings. Lungs/Pleura: There is no pleural effusion. As above, there is a recurrent left hilar mass with suprahilar extension. There is mild associated postobstructive pneumonitis in the left upper lobe which has increased. There is stable residual linear right lower lobe nodularity at the site of previously demonstrated metastases (axial images 63 and 82/4). Subpleural nodularity in the left lower lobe measuring 9 x 5 mm on image 87/4 stable. There are no other new or enlarging nodules. Mild underlying centrilobular emphysema.  Musculoskeletal/Chest wall: There are stable sclerotic lesions within the thoracic vertebral bodies. Severe compression deformity and osseous retropulsion at T5 is stable. There are stable mild compression deformities involving the inferior endplate of T4 and the superior endplate of T7. Incompletely healed fracture of the left 7th rib is stable. CT ABDOMEN AND PELVIS FINDINGS Hepatobiliary: Interval progression of multifocal hepatic metastatic disease. Lesions within the left lobe are confluent and difficult to measure,  although measure approximately 6.9 x 5.0 cm on image 54/2 and 7.5 x 5.0 cm on image 55/2. There is a 2.5 x 1.5 cm lesion in the right lobe on image 49/2. The gallbladder is incompletely distended. No evidence of gallstones, gallbladder wall thickening or significant biliary dilatation. Pancreas: Unremarkable. No pancreatic ductal dilatation or surrounding inflammatory changes. Spleen: Normal in size without focal abnormality. Adrenals/Urinary Tract: Both adrenal glands appear normal. The kidneys appear normal without evidence of urinary tract calculus, suspicious lesion or hydronephrosis. No bladder abnormalities are seen. Stomach/Bowel: No evidence of bowel wall thickening, distention or surrounding inflammatory change. There is moderate stool throughout the colon. There is mild fecalization of the distal small bowel without wall thickening or surrounding inflammation. There are mild diverticular changes in the distal colon. Vascular/Lymphatic: Porta hepatis and peripancreatic adenopathy has mildly progressed. There is a 12 mm portacaval node on image 58/2 and a 13 mm celiac node on image 55/2. No pelvic adenopathy. There is moderate aortic and branch vessel atherosclerosis without evidence of acute vascular findings. Reproductive: The uterus and ovaries appear normal. No adnexal mass. Other: Intact anterior abdominal wall. No ascites or peritoneal nodularity. Musculoskeletal: There are stable  probable sclerotic metastases within the bony pelvis and lumbar spine. Most prominent lesion in the left iliac bone measures 1.7 cm on image 89/2. There are stable chronic superior endplate compression deformities at L1 and L5. There are chronic bilateral L4 pars fractures with an associated grade 1 anterolisthesis and biforaminal narrowing at L4-5. IMPRESSION: 1. Local thoracic recurrence with a left hilar/suprahilar mass and progressive mediastinal lymphadenopathy. 2. Significant progression in multifocal hepatic metastatic disease. Mildly enlarged lymph nodes in the upper abdomen are likely metastases as well. 3. No change in residual mild peripheral lung nodularity and osseous metastatic disease. 4. Stable thoracolumbar compression fractures. Electronically Signed   By: Richardean Sale M.D.   On: 12/25/2017 14:59   Ct Abdomen Pelvis W Contrast  Result Date: 12/25/2017 CLINICAL DATA:  Follow-up stage IV small cell lung cancer with hepatic and osseous metastatic disease. Chemotherapy completed earlier this year. No current complaints. EXAM: CT CHEST, ABDOMEN, AND PELVIS WITH CONTRAST TECHNIQUE: Multidetector CT imaging of the chest, abdomen and pelvis was performed following the standard protocol during bolus administration of intravenous contrast. CONTRAST:  154mL ISOVUE-300 IOPAMIDOL (ISOVUE-300) INJECTION 61% COMPARISON:  Prior CTs 09/29/2017 and 08/14/2017. FINDINGS: CT CHEST FINDINGS Cardiovascular: There is atherosclerosis of the aorta, great vessels and coronary arteries. There is occlusion of the left upper lobe pulmonary artery by recurrent tumor in the left suprahilar area. No acute vascular findings are seen. The heart size is normal. There is no pericardial effusion. Mediastinum/Nodes: There is recurrent, confluent adenopathy in the AP window. There is a 2.6 x 1.9 cm component medially on image 16/2. There is a 1.3 x 1.6 cm subcarinal node on image 22/2. There is a left hilar/suprahilar mass,  measuring up to 2.9 x 4.7 cm on image 16/2. No axillary adenopathy. The thyroid gland, trachea and esophagus demonstrate no significant findings. Lungs/Pleura: There is no pleural effusion. As above, there is a recurrent left hilar mass with suprahilar extension. There is mild associated postobstructive pneumonitis in the left upper lobe which has increased. There is stable residual linear right lower lobe nodularity at the site of previously demonstrated metastases (axial images 63 and 82/4). Subpleural nodularity in the left lower lobe measuring 9 x 5 mm on image 87/4 stable. There are no other new or enlarging nodules. Mild underlying centrilobular  emphysema. Musculoskeletal/Chest wall: There are stable sclerotic lesions within the thoracic vertebral bodies. Severe compression deformity and osseous retropulsion at T5 is stable. There are stable mild compression deformities involving the inferior endplate of T4 and the superior endplate of T7. Incompletely healed fracture of the left 7th rib is stable. CT ABDOMEN AND PELVIS FINDINGS Hepatobiliary: Interval progression of multifocal hepatic metastatic disease. Lesions within the left lobe are confluent and difficult to measure, although measure approximately 6.9 x 5.0 cm on image 54/2 and 7.5 x 5.0 cm on image 55/2. There is a 2.5 x 1.5 cm lesion in the right lobe on image 49/2. The gallbladder is incompletely distended. No evidence of gallstones, gallbladder wall thickening or significant biliary dilatation. Pancreas: Unremarkable. No pancreatic ductal dilatation or surrounding inflammatory changes. Spleen: Normal in size without focal abnormality. Adrenals/Urinary Tract: Both adrenal glands appear normal. The kidneys appear normal without evidence of urinary tract calculus, suspicious lesion or hydronephrosis. No bladder abnormalities are seen. Stomach/Bowel: No evidence of bowel wall thickening, distention or surrounding inflammatory change. There is moderate  stool throughout the colon. There is mild fecalization of the distal small bowel without wall thickening or surrounding inflammation. There are mild diverticular changes in the distal colon. Vascular/Lymphatic: Porta hepatis and peripancreatic adenopathy has mildly progressed. There is a 12 mm portacaval node on image 58/2 and a 13 mm celiac node on image 55/2. No pelvic adenopathy. There is moderate aortic and branch vessel atherosclerosis without evidence of acute vascular findings. Reproductive: The uterus and ovaries appear normal. No adnexal mass. Other: Intact anterior abdominal wall. No ascites or peritoneal nodularity. Musculoskeletal: There are stable probable sclerotic metastases within the bony pelvis and lumbar spine. Most prominent lesion in the left iliac bone measures 1.7 cm on image 89/2. There are stable chronic superior endplate compression deformities at L1 and L5. There are chronic bilateral L4 pars fractures with an associated grade 1 anterolisthesis and biforaminal narrowing at L4-5. IMPRESSION: 1. Local thoracic recurrence with a left hilar/suprahilar mass and progressive mediastinal lymphadenopathy. 2. Significant progression in multifocal hepatic metastatic disease. Mildly enlarged lymph nodes in the upper abdomen are likely metastases as well. 3. No change in residual mild peripheral lung nodularity and osseous metastatic disease. 4. Stable thoracolumbar compression fractures. Electronically Signed   By: Richardean Sale M.D.   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  PLAN:  1. 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  Patient then received maintenance Tecentriq from Jun 21, 2017 through November 15, 2017.  CT scan results from December 25, 2017 reviewed independently with  progressive disease in her mediastinum as well as significant progression of multifocal hepatic disease.  Hospice was briefly discussed, but patient wished to continue aggressive treatment.  She completed cycle 1 of 5 consecutive days of topotecan approximately 2 weeks ago. Other than pancytopenia, she tolerated her treatments well.  Return to clinic in 1 week for repeat laboratory work, further evaluation, consideration of cycle 2, day 1 if her blood counts recover.  Plan to reimage after cycle 4. 2. Pain: Chronic and unchanged.  Patient is now seen in pain clinic.  Continue narcotics as prescribed.   3. Thrombocytopenia: Patient will return to clinic tomorrow to receive a platelet transfusion. 4.  Anemia: Hemoglobin has decreased to 9.2 likely secondary  to chemotherapy.  Monitor. 5.  Neutropenia: Secondary to treatment, monitor. 6.  COPD: Chronic and unchanged.  Continue outpatient palliative care.  Continue current medications as prescribed. 7.  Disposition: Hospice and end-of-life care were previously discussed, but patient wishes to continue aggressive treatments.  She expressed understanding that her treatment options are limited.  She confirms that she is a DNR and is having outpatient palliative care visit.   Patient expressed understanding and was in agreement with this plan. She also understands that She can call clinic at any time with any questions, concerns, or complaints.   Cancer Staging Small cell lung cancer [facility]) Staging form: Lung, AJCC 8th Edition - Clinical stage from 01/28/2017: Stage IV (cT4, cN3, pM1c) - Signed by Lloyd Huger, MD on 01/28/2017   Lloyd Huger, MD   01/17/2018 6:42 AM

## 2018-01-15 ENCOUNTER — Other Ambulatory Visit: Payer: Self-pay

## 2018-01-15 ENCOUNTER — Inpatient Hospital Stay: Payer: Medicare Other | Attending: Oncology

## 2018-01-15 ENCOUNTER — Other Ambulatory Visit: Payer: Self-pay | Admitting: *Deleted

## 2018-01-15 ENCOUNTER — Inpatient Hospital Stay (HOSPITAL_BASED_OUTPATIENT_CLINIC_OR_DEPARTMENT_OTHER): Payer: Medicare Other | Admitting: Oncology

## 2018-01-15 VITALS — BP 119/78 | HR 96 | Temp 98.5°F | Resp 18

## 2018-01-15 DIAGNOSIS — M48061 Spinal stenosis, lumbar region without neurogenic claudication: Secondary | ICD-10-CM

## 2018-01-15 DIAGNOSIS — R599 Enlarged lymph nodes, unspecified: Secondary | ICD-10-CM

## 2018-01-15 DIAGNOSIS — I10 Essential (primary) hypertension: Secondary | ICD-10-CM | POA: Insufficient documentation

## 2018-01-15 DIAGNOSIS — J189 Pneumonia, unspecified organism: Secondary | ICD-10-CM | POA: Insufficient documentation

## 2018-01-15 DIAGNOSIS — Z8701 Personal history of pneumonia (recurrent): Secondary | ICD-10-CM

## 2018-01-15 DIAGNOSIS — I429 Cardiomyopathy, unspecified: Secondary | ICD-10-CM | POA: Insufficient documentation

## 2018-01-15 DIAGNOSIS — K219 Gastro-esophageal reflux disease without esophagitis: Secondary | ICD-10-CM | POA: Insufficient documentation

## 2018-01-15 DIAGNOSIS — D61818 Other pancytopenia: Secondary | ICD-10-CM | POA: Diagnosis not present

## 2018-01-15 DIAGNOSIS — D649 Anemia, unspecified: Secondary | ICD-10-CM | POA: Diagnosis not present

## 2018-01-15 DIAGNOSIS — Z79899 Other long term (current) drug therapy: Secondary | ICD-10-CM | POA: Insufficient documentation

## 2018-01-15 DIAGNOSIS — F329 Major depressive disorder, single episode, unspecified: Secondary | ICD-10-CM | POA: Diagnosis not present

## 2018-01-15 DIAGNOSIS — E785 Hyperlipidemia, unspecified: Secondary | ICD-10-CM | POA: Diagnosis not present

## 2018-01-15 DIAGNOSIS — Z87891 Personal history of nicotine dependence: Secondary | ICD-10-CM | POA: Diagnosis not present

## 2018-01-15 DIAGNOSIS — I509 Heart failure, unspecified: Secondary | ICD-10-CM | POA: Diagnosis not present

## 2018-01-15 DIAGNOSIS — J449 Chronic obstructive pulmonary disease, unspecified: Secondary | ICD-10-CM

## 2018-01-15 DIAGNOSIS — G893 Neoplasm related pain (acute) (chronic): Secondary | ICD-10-CM

## 2018-01-15 DIAGNOSIS — M129 Arthropathy, unspecified: Secondary | ICD-10-CM | POA: Diagnosis not present

## 2018-01-15 DIAGNOSIS — C7951 Secondary malignant neoplasm of bone: Secondary | ICD-10-CM

## 2018-01-15 DIAGNOSIS — C3412 Malignant neoplasm of upper lobe, left bronchus or lung: Secondary | ICD-10-CM

## 2018-01-15 DIAGNOSIS — R110 Nausea: Secondary | ICD-10-CM | POA: Diagnosis not present

## 2018-01-15 DIAGNOSIS — Z5111 Encounter for antineoplastic chemotherapy: Secondary | ICD-10-CM | POA: Insufficient documentation

## 2018-01-15 DIAGNOSIS — C3490 Malignant neoplasm of unspecified part of unspecified bronchus or lung: Secondary | ICD-10-CM

## 2018-01-15 DIAGNOSIS — C787 Secondary malignant neoplasm of liver and intrahepatic bile duct: Secondary | ICD-10-CM | POA: Diagnosis not present

## 2018-01-15 LAB — CBC WITH DIFFERENTIAL/PLATELET
Abs Immature Granulocytes: 0.01 10*3/uL (ref 0.00–0.07)
BASOS PCT: 1 %
Basophils Absolute: 0 10*3/uL (ref 0.0–0.1)
EOS ABS: 0 10*3/uL (ref 0.0–0.5)
Eosinophils Relative: 0 %
HCT: 28.8 % — ABNORMAL LOW (ref 36.0–46.0)
Hemoglobin: 9.2 g/dL — ABNORMAL LOW (ref 12.0–15.0)
Immature Granulocytes: 1 %
LYMPHS ABS: 0.7 10*3/uL (ref 0.7–4.0)
Lymphocytes Relative: 73 %
MCH: 27.7 pg (ref 26.0–34.0)
MCHC: 31.9 g/dL (ref 30.0–36.0)
MCV: 86.7 fL (ref 80.0–100.0)
Monocytes Absolute: 0.1 10*3/uL (ref 0.1–1.0)
Monocytes Relative: 6 %
NEUTROS PCT: 19 %
NRBC: 0 % (ref 0.0–0.2)
Neutro Abs: 0.2 10*3/uL — ABNORMAL LOW (ref 1.7–7.7)
PLATELETS: 7 10*3/uL — AB (ref 150–400)
RBC: 3.32 MIL/uL — AB (ref 3.87–5.11)
RDW: 12.3 % (ref 11.5–15.5)
WBC: 1 10*3/uL — CL (ref 4.0–10.5)

## 2018-01-15 LAB — BASIC METABOLIC PANEL
ANION GAP: 7 (ref 5–15)
BUN: 15 mg/dL (ref 8–23)
CALCIUM: 8.6 mg/dL — AB (ref 8.9–10.3)
CO2: 28 mmol/L (ref 22–32)
Chloride: 100 mmol/L (ref 98–111)
Creatinine, Ser: 0.89 mg/dL (ref 0.44–1.00)
GFR calc non Af Amer: >60 mL/min (ref >60)
Glucose, Bld: 118 mg/dL — ABNORMAL HIGH (ref 70–99)
Potassium: 3.9 mmol/L (ref 3.5–5.1)
Sodium: 135 mmol/L (ref 135–145)

## 2018-01-15 NOTE — Progress Notes (Signed)
Patient here today for follow up regarding lung cancer, follow up from chemotherapy. Patient denies nausea or vomiting with treatment, denies other concerns today.

## 2018-01-16 ENCOUNTER — Inpatient Hospital Stay: Payer: Medicare Other

## 2018-01-16 DIAGNOSIS — R599 Enlarged lymph nodes, unspecified: Secondary | ICD-10-CM | POA: Diagnosis not present

## 2018-01-16 DIAGNOSIS — R110 Nausea: Secondary | ICD-10-CM | POA: Diagnosis not present

## 2018-01-16 DIAGNOSIS — Z5111 Encounter for antineoplastic chemotherapy: Secondary | ICD-10-CM | POA: Diagnosis not present

## 2018-01-16 DIAGNOSIS — C3412 Malignant neoplasm of upper lobe, left bronchus or lung: Secondary | ICD-10-CM | POA: Diagnosis not present

## 2018-01-16 DIAGNOSIS — C7951 Secondary malignant neoplasm of bone: Secondary | ICD-10-CM | POA: Diagnosis not present

## 2018-01-16 DIAGNOSIS — C787 Secondary malignant neoplasm of liver and intrahepatic bile duct: Secondary | ICD-10-CM | POA: Diagnosis not present

## 2018-01-16 DIAGNOSIS — C3490 Malignant neoplasm of unspecified part of unspecified bronchus or lung: Secondary | ICD-10-CM

## 2018-01-16 LAB — THYROID PANEL WITH TSH
Free Thyroxine Index: 2 (ref 1.2–4.9)
T3 Uptake Ratio: 24 % (ref 24–39)
T4, Total: 8.4 ug/dL (ref 4.5–12.0)
TSH: 5.31 u[IU]/mL — AB (ref 0.450–4.500)

## 2018-01-16 MED ORDER — ACETAMINOPHEN 325 MG PO TABS
650.0000 mg | ORAL_TABLET | Freq: Once | ORAL | Status: AC
  Administered 2018-01-16: 650 mg via ORAL
  Filled 2018-01-16: qty 2

## 2018-01-16 MED ORDER — DIPHENHYDRAMINE HCL 50 MG/ML IJ SOLN
25.0000 mg | Freq: Once | INTRAMUSCULAR | Status: AC
  Administered 2018-01-16: 25 mg via INTRAVENOUS
  Filled 2018-01-16: qty 1

## 2018-01-16 MED ORDER — SODIUM CHLORIDE 0.9% IV SOLUTION
250.0000 mL | Freq: Once | INTRAVENOUS | Status: AC
  Administered 2018-01-16: 250 mL via INTRAVENOUS
  Filled 2018-01-16: qty 250

## 2018-01-17 LAB — BPAM PLATELET PHERESIS
Blood Product Expiration Date: 201912042359
ISSUE DATE / TIME: 201912031208
Unit Type and Rh: 6200

## 2018-01-17 LAB — PREPARE PLATELET PHERESIS: Unit division: 0

## 2018-01-21 NOTE — Progress Notes (Signed)
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  INTERVAL HISTORY: Patient returns to clinic today for further evaluation and consideration of cycle 2 of topotecan.  Grace [name] currently feels well and is at her baseline.  Grace [name] continues to have chronic weakness and fatigue.  Her pain is chronic and unchanged as well, but well controlled with her current narcotic regimen.  Grace [name] has no neurologic complaints.  Grace [name] denies any recent fevers or illnesses.  Grace [name] has a good appetite and denies weight loss.  Grace [name] has chronic shortness of breath, but denies any chest pain or hemoptysis. Grace [name] has no abdominal pain.  Grace [name] denies any melena or hematochezia.  Grace [name] has no urinary complaints.  Patient offers no further specific complaints today.  REVIEW OF SYSTEMS:   Review of Systems  Constitutional: Positive for malaise/fatigue. Negative for fever and weight loss.  Respiratory: Positive for shortness of breath. Negative for cough, hemoptysis and wheezing.   Cardiovascular: Negative.  Negative for chest pain and leg swelling.  Gastrointestinal: Negative.  Negative for abdominal pain, blood in stool, diarrhea, melena, nausea and vomiting.  Genitourinary: Negative.  Negative for dysuria and frequency.  Musculoskeletal: Positive for back pain, joint pain and neck pain.  Skin: Negative.  Negative for rash.  Neurological: Positive for weakness. Negative for sensory change, focal weakness and headaches.  Psychiatric/Behavioral: Negative.  The patient is not nervous/anxious.     As per HPI. Otherwise, a complete review of systems is negative.  PAST MEDICAL HISTORY: Past Medical History:  Diagnosis Date  . Anxiety   . Arthritis     . Asthma    COPD  . Cancer ([place])    Liver  . CAP (community acquired pneumonia) 06/30/2014  . Cardiomyopathy ([place])    nonischemic (EF 35-40%)  . CHF (congestive heart failure) ([place])   . COPD (chronic obstructive pulmonary disease) ([place])   . Depression   . Dyspnea   . GERD (gastroesophageal reflux disease)   . Hyperlipidemia   . Hypertension   . Pneumonia   . PVC's (premature ventricular contractions)    states 10 years ago  . Sepsis ([place]) 06/30/2014  . SOB (shortness of breath) 05/08/2014  . Spinal cord injury at T7-T12 level [facility])    2016    PAST SURGICAL HISTORY: Past Surgical History:  Procedure Laterality Date  . APPENDECTOMY    . CARDIAC CATHETERIZATION    . CATARACT EXTRACTION     right eye   . CESAREAN SECTION    . COLONOSCOPY    . LAMINOTOMY    . LUMBAR LAMINECTOMY/DECOMPRESSION MICRODISCECTOMY Left 08/01/2016   Procedure: Left Lumbar Four-Five Laminectomy and Foraminotomy;  Surgeon: Earnie Larsson, MD;  Location: [facility];  Service: Neurosurgery;  Laterality: Left;  . PORTA CATH INSERTION N/A 02/03/2017   Procedure: PORTA CATH INSERTION;  Surgeon: Katha Cabal, MD;  Location: [facility];  Service: Cardiovascular;  Laterality: N/A;    FAMILY HISTORY: Family History  Problem Relation Age of Onset  . Heart attack Father 69       MI x 3     ADVANCED DIRECTIVES (Y/N):  N  HEALTH MAINTENANCE: Social History   Tobacco Use  . Smoking status: Former Smoker  Packs/day: 0.50    Years: 50.00    Pack years: 25.00    Types: Cigarettes    Last attempt to quit: 01/27/2015    Years since quitting: 2.9  . Smokeless tobacco: Never Used  Substance Use Topics  . Alcohol use: No  . Drug use: No     Colonoscopy:  PAP:  Bone density:  Lipid panel:  Allergies  Allergen Reactions  . Fish Allergy Anaphylaxis    Per pt after eating [facility] Grace [name] had throat swelling and SOB. MSY   . Fentanyl     Overly sedated and groggy, ineffective     Current  Outpatient Medications  Medication Sig Dispense Refill  . ADVAIR DISKUS 250-50 MCG/DOSE AEPB Inhale 1 puff into the lungs 2 (two) times daily.    Marland Kitchen albuterol (PROAIR HFA) 108 (90 Base) MCG/ACT inhaler Inhale 2 puffs into the lungs every 6 (six) hours as needed for wheezing or shortness of breath.     Marland Kitchen alendronate (FOSAMAX) 70 MG tablet Take 70 mg by mouth every Thursday.     . ARIPiprazole (ABILIFY) 5 MG tablet Take 5 mg by mouth daily.    Marland Kitchen BELBUCA 150 MCG FILM Take 1 tablet by mouth daily.  0  . benzonatate (TESSALON) 100 MG capsule Take 1 capsule (100 mg total) by mouth every 8 (eight) hours as needed for cough. 20 capsule 0  . diphenoxylate-atropine (LOMOTIL) 2.5-0.025 MG tablet Take 1 tablet by mouth 4 (four) times daily as needed for diarrhea or loose stools. 30 tablet 0  . DULoxetine (CYMBALTA) 60 MG capsule Take 60 mg by mouth daily.    Marland Kitchen ipratropium-albuterol (DUONEB) 0.5-2.5 (3) MG/3ML SOLN Take 3 mLs by nebulization every 4 (four) hours as needed. 360 mL 1  . LORazepam (ATIVAN) 0.5 MG tablet Take 0.5 mg by mouth as needed.    . metoprolol succinate (TOPROL-XL) 50 MG 24 hr tablet Take 1 tablet (50 mg total) by mouth daily. Take with or immediately following a meal. 180 tablet 3  . morphine (MS CONTIN) 15 MG 12 hr tablet Take 1 tablet (15 mg total) by mouth every 12 (twelve) hours. 60 tablet 0  . pantoprazole (PROTONIX) 40 MG tablet Take 1 tablet by mouth daily.    . potassium chloride SA (K-DUR,KLOR-CON) 20 MEQ tablet Take 1 tablet (20 mEq total) by mouth daily. 30 tablet 2  . prochlorperazine (COMPAZINE) 10 MG tablet Take 1 tablet (10 mg total) by mouth every 6 (six) hours as needed (Nausea or vomiting). 60 tablet 2  . promethazine (PHENERGAN) 25 MG tablet Take 1 tablet (25 mg total) by mouth every 8 (eight) hours as needed for nausea or vomiting. 30 tablet 2  . sacubitril-valsartan (ENTRESTO) 24-26 MG Take 1 tablet by mouth 2 (two) times daily. 180 tablet 3  . tiZANidine (ZANAFLEX) 4  MG tablet Take 1 tablet (4 mg total) by mouth every 8 (eight) hours as needed for muscle spasms. 90 tablet 2   No current facility-administered medications for this visit.    Facility-Administered Medications Ordered in Other Visits  Medication Dose Route Frequency Provider Last Rate Last Dose  . sodium chloride flush (NS) 0.9 % injection 10 mL  10 mL Intravenous PRN Lloyd Huger, MD   10 mL at 03/01/17 0841  . sodium chloride flush (NS) 0.9 % injection 10 mL  10 mL Intravenous PRN Lloyd Huger, MD   10 mL at 01/22/18 0951    OBJECTIVE: Vitals:   01/22/18  1001 01/22/18 1006  BP:  120/78  Pulse:  73  Resp: 16   Temp:  (!) 97.5 F (36.4 C)     Body mass index is 33.95 kg/m.    ECOG FS:1 - Symptomatic but completely ambulatory  General: Well-developed, well-nourished, no acute distress.  Sitting in a wheelchair. Eyes: Pink conjunctiva, anicteric sclera. HEENT: Normocephalic, moist mucous membranes. Lungs: Clear to auscultation bilaterally. Heart: Regular rate and rhythm. No rubs, murmurs, or gallops. Abdomen: Soft, nontender, nondistended. No organomegaly noted, normoactive bowel sounds. Musculoskeletal: No edema, cyanosis, or clubbing. Neuro: Alert, answering all questions appropriately. Cranial nerves grossly intact. Skin: No rashes or petechiae noted. Psych: Normal affect.  LAB RESULTS:  Lab Results  Component Value Date   NA 138 01/22/2018   K 3.4 (L) 01/22/2018   CL 105 01/22/2018   CO2 26 01/22/2018   GLUCOSE 143 (H) 01/22/2018   BUN 14 01/22/2018   CREATININE 1.01 (H) 01/22/2018   CALCIUM 8.7 (L) 01/22/2018   PROT 7.6 01/08/2018   ALBUMIN 3.4 (L) 01/08/2018   AST 35 01/08/2018   ALT 34 01/08/2018   ALKPHOS 73 01/08/2018   BILITOT 0.5 01/08/2018   GFRNONAA 58 (L) 01/22/2018   GFRAA >60 01/22/2018    Lab Results  Component Value Date   WBC 3.6 (L) 01/22/2018   NEUTROABS 1.5 (L) 01/22/2018   HGB 9.5 (L) 01/22/2018   HCT 29.6 (L) 01/22/2018    MCV 87.8 01/22/2018   PLT 72 (L) 01/22/2018     STUDIES: Ct Chest W Contrast  Result Date: 12/25/2017 CLINICAL DATA:  Follow-up stage IV small cell lung cancer with hepatic and osseous metastatic disease. Chemotherapy completed earlier this year. No current complaints. EXAM: CT CHEST, ABDOMEN, AND PELVIS WITH CONTRAST TECHNIQUE: Multidetector CT imaging of the chest, abdomen and pelvis was performed following the standard protocol during bolus administration of intravenous contrast. CONTRAST:  131mL ISOVUE-300 IOPAMIDOL (ISOVUE-300) INJECTION 61% COMPARISON:  Prior CTs 09/29/2017 and 08/14/2017. FINDINGS: CT CHEST FINDINGS Cardiovascular: There is atherosclerosis of the aorta, great vessels and coronary arteries. There is occlusion of the left upper lobe pulmonary artery by recurrent tumor in the left suprahilar area. No acute vascular findings are seen. The heart size is normal. There is no pericardial effusion. Mediastinum/Nodes: There is recurrent, confluent adenopathy in the AP window. There is a 2.6 x 1.9 cm component medially on image 16/2. There is a 1.3 x 1.6 cm subcarinal node on image 22/2. There is a left hilar/suprahilar mass, measuring up to 2.9 x 4.7 cm on image 16/2. No axillary adenopathy. The thyroid gland, trachea and esophagus demonstrate no significant findings. Lungs/Pleura: There is no pleural effusion. As above, there is a recurrent left hilar mass with suprahilar extension. There is mild associated postobstructive pneumonitis in the left upper lobe which has increased. There is stable residual linear right lower lobe nodularity at the site of previously demonstrated metastases (axial images 63 and 82/4). Subpleural nodularity in the left lower lobe measuring 9 x 5 mm on image 87/4 stable. There are no other new or enlarging nodules. Mild underlying centrilobular emphysema. Musculoskeletal/Chest wall: There are stable sclerotic lesions within the thoracic vertebral bodies. Severe  compression deformity and osseous retropulsion at T5 is stable. There are stable mild compression deformities involving the inferior endplate of T4 and the superior endplate of T7. Incompletely healed fracture of the left 7th rib is stable. CT ABDOMEN AND PELVIS FINDINGS Hepatobiliary: Interval progression of multifocal hepatic metastatic disease. Lesions within the left  lobe are confluent and difficult to measure, although measure approximately 6.9 x 5.0 cm on image 54/2 and 7.5 x 5.0 cm on image 55/2. There is a 2.5 x 1.5 cm lesion in the right lobe on image 49/2. The gallbladder is incompletely distended. No evidence of gallstones, gallbladder wall thickening or significant biliary dilatation. Pancreas: Unremarkable. No pancreatic ductal dilatation or surrounding inflammatory changes. Spleen: Normal in size without focal abnormality. Adrenals/Urinary Tract: Both adrenal glands appear normal. The kidneys appear normal without evidence of urinary tract calculus, suspicious lesion or hydronephrosis. No bladder abnormalities are seen. Stomach/Bowel: No evidence of bowel wall thickening, distention or surrounding inflammatory change. There is moderate stool throughout the colon. There is mild fecalization of the distal small bowel without wall thickening or surrounding inflammation. There are mild diverticular changes in the distal colon. Vascular/Lymphatic: Porta hepatis and peripancreatic adenopathy has mildly progressed. There is a 12 mm portacaval node on image 58/2 and a 13 mm celiac node on image 55/2. No pelvic adenopathy. There is moderate aortic and branch vessel atherosclerosis without evidence of acute vascular findings. Reproductive: The uterus and ovaries appear normal. No adnexal mass. Other: Intact anterior abdominal wall. No ascites or peritoneal nodularity. Musculoskeletal: There are stable probable sclerotic metastases within the bony pelvis and lumbar spine. Most prominent lesion in the left iliac  bone measures 1.7 cm on image 89/2. There are stable chronic superior endplate compression deformities at L1 and L5. There are chronic bilateral L4 pars fractures with an associated grade 1 anterolisthesis and biforaminal narrowing at L4-5. IMPRESSION: 1. Local thoracic recurrence with a left hilar/suprahilar mass and progressive mediastinal lymphadenopathy. 2. Significant progression in multifocal hepatic metastatic disease. Mildly enlarged lymph nodes in the upper abdomen are likely metastases as well. 3. No change in residual mild peripheral lung nodularity and osseous metastatic disease. 4. Stable thoracolumbar compression fractures. Electronically Signed   By: Richardean Sale M.D.   On: 12/25/2017 14:59   Ct Abdomen Pelvis W Contrast  Result Date: 12/25/2017 CLINICAL DATA:  Follow-up stage IV small cell lung cancer with hepatic and osseous metastatic disease. Chemotherapy completed earlier this year. No current complaints. EXAM: CT CHEST, ABDOMEN, AND PELVIS WITH CONTRAST TECHNIQUE: Multidetector CT imaging of the chest, abdomen and pelvis was performed following the standard protocol during bolus administration of intravenous contrast. CONTRAST:  169mL ISOVUE-300 IOPAMIDOL (ISOVUE-300) INJECTION 61% COMPARISON:  Prior CTs 09/29/2017 and 08/14/2017. FINDINGS: CT CHEST FINDINGS Cardiovascular: There is atherosclerosis of the aorta, great vessels and coronary arteries. There is occlusion of the left upper lobe pulmonary artery by recurrent tumor in the left suprahilar area. No acute vascular findings are seen. The heart size is normal. There is no pericardial effusion. Mediastinum/Nodes: There is recurrent, confluent adenopathy in the AP window. There is a 2.6 x 1.9 cm component medially on image 16/2. There is a 1.3 x 1.6 cm subcarinal node on image 22/2. There is a left hilar/suprahilar mass, measuring up to 2.9 x 4.7 cm on image 16/2. No axillary adenopathy. The thyroid gland, trachea and esophagus  demonstrate no significant findings. Lungs/Pleura: There is no pleural effusion. As above, there is a recurrent left hilar mass with suprahilar extension. There is mild associated postobstructive pneumonitis in the left upper lobe which has increased. There is stable residual linear right lower lobe nodularity at the site of previously demonstrated metastases (axial images 63 and 82/4). Subpleural nodularity in the left lower lobe measuring 9 x 5 mm on image 87/4 stable. There are no other  new or enlarging nodules. Mild underlying centrilobular emphysema. Musculoskeletal/Chest wall: There are stable sclerotic lesions within the thoracic vertebral bodies. Severe compression deformity and osseous retropulsion at T5 is stable. There are stable mild compression deformities involving the inferior endplate of T4 and the superior endplate of T7. Incompletely healed fracture of the left 7th rib is stable. CT ABDOMEN AND PELVIS FINDINGS Hepatobiliary: Interval progression of multifocal hepatic metastatic disease. Lesions within the left lobe are confluent and difficult to measure, although measure approximately 6.9 x 5.0 cm on image 54/2 and 7.5 x 5.0 cm on image 55/2. There is a 2.5 x 1.5 cm lesion in the right lobe on image 49/2. The gallbladder is incompletely distended. No evidence of gallstones, gallbladder wall thickening or significant biliary dilatation. Pancreas: Unremarkable. No pancreatic ductal dilatation or surrounding inflammatory changes. Spleen: Normal in size without focal abnormality. Adrenals/Urinary Tract: Both adrenal glands appear normal. The kidneys appear normal without evidence of urinary tract calculus, suspicious lesion or hydronephrosis. No bladder abnormalities are seen. Stomach/Bowel: No evidence of bowel wall thickening, distention or surrounding inflammatory change. There is moderate stool throughout the colon. There is mild fecalization of the distal small bowel without wall thickening or  surrounding inflammation. There are mild diverticular changes in the distal colon. Vascular/Lymphatic: Porta hepatis and peripancreatic adenopathy has mildly progressed. There is a 12 mm portacaval node on image 58/2 and a 13 mm celiac node on image 55/2. No pelvic adenopathy. There is moderate aortic and branch vessel atherosclerosis without evidence of acute vascular findings. Reproductive: The uterus and ovaries appear normal. No adnexal mass. Other: Intact anterior abdominal wall. No ascites or peritoneal nodularity. Musculoskeletal: There are stable probable sclerotic metastases within the bony pelvis and lumbar spine. Most prominent lesion in the left iliac bone measures 1.7 cm on image 89/2. There are stable chronic superior endplate compression deformities at L1 and L5. There are chronic bilateral L4 pars fractures with an associated grade 1 anterolisthesis and biforaminal narrowing at L4-5. IMPRESSION: 1. Local thoracic recurrence with a left hilar/suprahilar mass and progressive mediastinal lymphadenopathy. 2. Significant progression in multifocal hepatic metastatic disease. Mildly enlarged lymph nodes in the upper abdomen are likely metastases as well. 3. No change in residual mild peripheral lung nodularity and osseous metastatic disease. 4. Stable thoracolumbar compression fractures. Electronically Signed   By: Richardean Sale M.D.   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  PLAN:  1. 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  Patient then received maintenance Tecentriq from Jun 21, 2017 through November 15, 2017.  CT scan results from December 25, 2017 reviewed independently with progressive disease in her mediastinum as well as significant progression of multifocal hepatic disease.   Hospice was briefly discussed, but patient wished to continue aggressive treatment.  Patient was initially scheduled to get topotecan on days 1 through 5 every 21 days, but given her persistent thrombocytopenia Grace [name] may only be able to receive treatment every 28 days.  We will also dose reduce topotecan with next cycle.  Delay cycle 2 today.  Return to clinic in 1 week for further evaluation and reconsideration of cycle 2, day 1.  Plan to reimage after cycle 4. 2. Pain: Chronic and unchanged.  Patient is now seen in pain clinic.  Continue narcotics as prescribed.  3. Thrombocytopenia: Significantly improved, but will still delay treatment as above.   4.  Anemia: Hemoglobin decreased, but slowly improving to 9.5. Monitor. 5.  Neutropenia: Resolved.   6.  COPD: Chronic and unchanged.  Continue outpatient palliative care.  Continue current medications as prescribed. 7.  Disposition: Hospice and end-of-life care were previously discussed, but patient wishes to continue aggressive treatments.  Grace [name] expressed understanding that her treatment options are limited.  Grace [name] confirms that Grace [name] is a DNR and is having outpatient palliative care visit.   Patient expressed understanding and was in agreement with this plan. Grace [name] also understands that Grace [name] can call clinic at any time with any questions, concerns, or complaints.   Cancer Staging Small cell lung cancer [facility]) Staging form: Lung, AJCC 8th Edition - Clinical stage from 01/28/2017: Stage IV (cT4, cN3, pM1c) - Signed by Lloyd Huger, MD on 01/28/2017   Lloyd Huger, MD   01/22/2018 10:39 AM

## 2018-01-22 ENCOUNTER — Encounter: Payer: Self-pay | Admitting: Oncology

## 2018-01-22 ENCOUNTER — Inpatient Hospital Stay: Payer: Medicare Other

## 2018-01-22 ENCOUNTER — Inpatient Hospital Stay (HOSPITAL_BASED_OUTPATIENT_CLINIC_OR_DEPARTMENT_OTHER): Payer: Medicare Other | Admitting: Oncology

## 2018-01-22 ENCOUNTER — Other Ambulatory Visit: Payer: Self-pay

## 2018-01-22 VITALS — BP 120/78 | HR 73 | Temp 97.5°F | Resp 16 | Ht 65.0 in

## 2018-01-22 DIAGNOSIS — R599 Enlarged lymph nodes, unspecified: Secondary | ICD-10-CM

## 2018-01-22 DIAGNOSIS — I509 Heart failure, unspecified: Secondary | ICD-10-CM

## 2018-01-22 DIAGNOSIS — I429 Cardiomyopathy, unspecified: Secondary | ICD-10-CM | POA: Diagnosis not present

## 2018-01-22 DIAGNOSIS — C7951 Secondary malignant neoplasm of bone: Secondary | ICD-10-CM | POA: Diagnosis not present

## 2018-01-22 DIAGNOSIS — C3490 Malignant neoplasm of unspecified part of unspecified bronchus or lung: Secondary | ICD-10-CM

## 2018-01-22 DIAGNOSIS — R110 Nausea: Secondary | ICD-10-CM | POA: Diagnosis not present

## 2018-01-22 DIAGNOSIS — J449 Chronic obstructive pulmonary disease, unspecified: Secondary | ICD-10-CM | POA: Diagnosis not present

## 2018-01-22 DIAGNOSIS — C3412 Malignant neoplasm of upper lobe, left bronchus or lung: Secondary | ICD-10-CM

## 2018-01-22 DIAGNOSIS — D61818 Other pancytopenia: Secondary | ICD-10-CM

## 2018-01-22 DIAGNOSIS — K219 Gastro-esophageal reflux disease without esophagitis: Secondary | ICD-10-CM

## 2018-01-22 DIAGNOSIS — M48061 Spinal stenosis, lumbar region without neurogenic claudication: Secondary | ICD-10-CM

## 2018-01-22 DIAGNOSIS — M129 Arthropathy, unspecified: Secondary | ICD-10-CM | POA: Diagnosis not present

## 2018-01-22 DIAGNOSIS — Z79899 Other long term (current) drug therapy: Secondary | ICD-10-CM

## 2018-01-22 DIAGNOSIS — C787 Secondary malignant neoplasm of liver and intrahepatic bile duct: Secondary | ICD-10-CM

## 2018-01-22 DIAGNOSIS — F329 Major depressive disorder, single episode, unspecified: Secondary | ICD-10-CM | POA: Diagnosis not present

## 2018-01-22 DIAGNOSIS — J189 Pneumonia, unspecified organism: Secondary | ICD-10-CM | POA: Diagnosis not present

## 2018-01-22 DIAGNOSIS — Z87891 Personal history of nicotine dependence: Secondary | ICD-10-CM

## 2018-01-22 DIAGNOSIS — Z5111 Encounter for antineoplastic chemotherapy: Secondary | ICD-10-CM | POA: Diagnosis not present

## 2018-01-22 DIAGNOSIS — G893 Neoplasm related pain (acute) (chronic): Secondary | ICD-10-CM | POA: Diagnosis not present

## 2018-01-22 DIAGNOSIS — Z8701 Personal history of pneumonia (recurrent): Secondary | ICD-10-CM

## 2018-01-22 DIAGNOSIS — I10 Essential (primary) hypertension: Secondary | ICD-10-CM

## 2018-01-22 DIAGNOSIS — E785 Hyperlipidemia, unspecified: Secondary | ICD-10-CM

## 2018-01-22 LAB — BASIC METABOLIC PANEL
Anion gap: 7 (ref 5–15)
BUN: 14 mg/dL (ref 8–23)
CO2: 26 mmol/L (ref 22–32)
Calcium: 8.7 mg/dL — ABNORMAL LOW (ref 8.9–10.3)
Chloride: 105 mmol/L (ref 98–111)
Creatinine, Ser: 1.01 mg/dL — ABNORMAL HIGH (ref 0.44–1.00)
GFR calc non Af Amer: 58 mL/min — ABNORMAL LOW (ref >60)
Glucose, Bld: 143 mg/dL — ABNORMAL HIGH (ref 70–99)
Potassium: 3.4 mmol/L — ABNORMAL LOW (ref 3.5–5.1)
SODIUM: 138 mmol/L (ref 135–145)

## 2018-01-22 LAB — CBC WITH DIFFERENTIAL/PLATELET
Abs Immature Granulocytes: 0.08 10*3/uL — ABNORMAL HIGH (ref 0.00–0.07)
BASOS PCT: 1 %
Basophils Absolute: 0 10*3/uL (ref 0.0–0.1)
Eosinophils Absolute: 0 10*3/uL (ref 0.0–0.5)
Eosinophils Relative: 0 %
HCT: 29.6 % — ABNORMAL LOW (ref 36.0–46.0)
Hemoglobin: 9.5 g/dL — ABNORMAL LOW (ref 12.0–15.0)
Immature Granulocytes: 2 %
Lymphocytes Relative: 37 %
Lymphs Abs: 1.3 10*3/uL (ref 0.7–4.0)
MCH: 28.2 pg (ref 26.0–34.0)
MCHC: 32.1 g/dL (ref 30.0–36.0)
MCV: 87.8 fL (ref 80.0–100.0)
Monocytes Absolute: 0.6 10*3/uL (ref 0.1–1.0)
Monocytes Relative: 17 %
NEUTROS PCT: 43 %
Neutro Abs: 1.5 10*3/uL — ABNORMAL LOW (ref 1.7–7.7)
Platelets: 72 10*3/uL — ABNORMAL LOW (ref 150–400)
RBC: 3.37 MIL/uL — ABNORMAL LOW (ref 3.87–5.11)
RDW: 12.7 % (ref 11.5–15.5)
WBC: 3.6 10*3/uL — ABNORMAL LOW (ref 4.0–10.5)
nRBC: 0 % (ref 0.0–0.2)

## 2018-01-22 MED ORDER — SODIUM CHLORIDE 0.9% FLUSH
10.0000 mL | INTRAVENOUS | Status: DC | PRN
  Administered 2018-01-22: 10 mL via INTRAVENOUS
  Filled 2018-01-22: qty 10

## 2018-01-22 MED ORDER — HEPARIN SOD (PORK) LOCK FLUSH 100 UNIT/ML IV SOLN
500.0000 [IU] | Freq: Once | INTRAVENOUS | Status: AC
  Administered 2018-01-22: 500 [IU] via INTRAVENOUS
  Filled 2018-01-22: qty 5

## 2018-01-22 NOTE — Progress Notes (Signed)
Patient reports feeling weak and fatigued and nauseous at least once a day. Poor appetite as well.

## 2018-01-23 ENCOUNTER — Inpatient Hospital Stay: Payer: Medicare Other

## 2018-01-23 LAB — THYROID PANEL WITH TSH
FREE THYROXINE INDEX: 2.1 (ref 1.2–4.9)
T3 Uptake Ratio: 24 % (ref 24–39)
T4, Total: 8.8 ug/dL (ref 4.5–12.0)
TSH: 5.21 u[IU]/mL — ABNORMAL HIGH (ref 0.450–4.500)

## 2018-01-24 ENCOUNTER — Ambulatory Visit: Payer: Medicare Other

## 2018-01-25 ENCOUNTER — Ambulatory Visit: Payer: Medicare Other

## 2018-01-26 ENCOUNTER — Ambulatory Visit: Payer: Medicare Other

## 2018-01-28 NOTE — Progress Notes (Signed)
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  INTERVAL HISTORY: Patient returns to clinic today for further evaluation and reconsideration of cycle 2 of topotecan.  She continues to have chronic weakness and fatigue.  She also has occasional nausea.  Her pain is chronic and unchanged as well, but well controlled with her current narcotic regimen.  She has no neurologic complaints.  She denies any recent fevers or illnesses.  She has a good appetite and denies weight loss.  She has chronic shortness of breath, but denies any chest pain or hemoptysis. She has no abdominal pain.  She denies any melena or hematochezia.  She has no urinary complaints.  Patient offers no further specific complaints today.  REVIEW OF SYSTEMS:   Review of Systems  Constitutional: Positive for malaise/fatigue. Negative for fever and weight loss.  Respiratory: Positive for shortness of breath. Negative for cough, hemoptysis and wheezing.   Cardiovascular: Negative.  Negative for chest pain and leg swelling.  Gastrointestinal: Negative.  Negative for abdominal pain, blood in stool, diarrhea, melena, nausea and vomiting.  Genitourinary: Negative.  Negative for dysuria and frequency.  Musculoskeletal: Positive for back pain, joint pain and neck pain.  Skin: Negative.  Negative for rash.  Neurological: Positive for weakness. Negative for sensory change, focal weakness and headaches.  Psychiatric/Behavioral: Negative.  The patient is not nervous/anxious.     As per HPI. Otherwise, a complete review of systems is negative.  PAST MEDICAL HISTORY: Past Medical History:  Diagnosis Date  . Anxiety   . Arthritis   . Asthma    COPD  . Cancer ([place])    Liver  . CAP (community acquired pneumonia) 06/30/2014  . Cardiomyopathy ([place])    nonischemic (EF 35-40%)  . CHF (congestive heart failure) ([place])   . COPD (chronic obstructive pulmonary disease) ([place])   . Depression   . Dyspnea   . GERD (gastroesophageal reflux disease)   . Hyperlipidemia   . Hypertension   . Pneumonia   . PVC's (premature ventricular contractions)    states 10 years ago  . Sepsis ([place]) 06/30/2014  . SOB (shortness of breath) 05/08/2014  . Spinal cord injury at T7-T12 level [facility])    2016    PAST SURGICAL HISTORY: Past Surgical History:  Procedure Laterality Date  . APPENDECTOMY    . CARDIAC CATHETERIZATION    . CATARACT EXTRACTION     right eye   . CESAREAN SECTION    . COLONOSCOPY    . LAMINOTOMY    . LUMBAR LAMINECTOMY/DECOMPRESSION MICRODISCECTOMY Left 08/01/2016   Procedure: Left Lumbar Four-Five Laminectomy and Foraminotomy;  Surgeon: Earnie Larsson, MD;  Location: [place];  Service: Neurosurgery;  Laterality: Left;  . PORTA CATH INSERTION N/A 02/03/2017   Procedure: PORTA CATH INSERTION;  Surgeon: Katha Cabal, MD;  Location: [place] [facility];  Service: Cardiovascular;  Laterality: N/A;    FAMILY HISTORY: Family History  Problem Relation Age of Onset  . Heart attack Father 36       MI x 3     ADVANCED DIRECTIVES (Y/N):  N  HEALTH MAINTENANCE: Social History   Tobacco Use  . Smoking status: Former Smoker    Packs/day: 0.50    Years:  50.00    Pack years: 25.00    Types: Cigarettes    Last attempt to quit: 01/27/2015    Years since quitting: 3.0  . Smokeless tobacco: Never Used  Substance Use Topics  . Alcohol use: No  . Drug use: No     Colonoscopy:  PAP:  Bone density:  Lipid panel:  Allergies  Allergen Reactions  . Fish Allergy Anaphylaxis    Per pt after eating [facility] she had throat swelling and SOB. MSY   . Fentanyl     Overly sedated and groggy, ineffective     Current Outpatient  Medications  Medication Sig Dispense Refill  . ADVAIR DISKUS 250-50 MCG/DOSE AEPB Inhale 1 puff into the lungs 2 (two) times daily.    Marland Kitchen albuterol (PROAIR HFA) 108 (90 Base) MCG/ACT inhaler Inhale 2 puffs into the lungs every 6 (six) hours as needed for wheezing or shortness of breath.     Marland Kitchen alendronate (FOSAMAX) 70 MG tablet Take 70 mg by mouth every Thursday.     . ARIPiprazole (ABILIFY) 5 MG tablet Take 5 mg by mouth daily.    Marland Kitchen BELBUCA 150 MCG FILM Take 1 tablet by mouth daily.  0  . benzonatate (TESSALON) 100 MG capsule Take 1 capsule (100 mg total) by mouth every 8 (eight) hours as needed for cough. 20 capsule 0  . diphenoxylate-atropine (LOMOTIL) 2.5-0.025 MG tablet Take 1 tablet by mouth 4 (four) times daily as needed for diarrhea or loose stools. 30 tablet 0  . DULoxetine (CYMBALTA) 60 MG capsule Take 60 mg by mouth daily.    Marland Kitchen ipratropium-albuterol (DUONEB) 0.5-2.5 (3) MG/3ML SOLN Take 3 mLs by nebulization every 4 (four) hours as needed. 360 mL 1  . LORazepam (ATIVAN) 0.5 MG tablet Take 0.5 mg by mouth as needed.    . metoprolol succinate (TOPROL-XL) 50 MG 24 hr tablet Take 1 tablet (50 mg total) by mouth daily. Take with or immediately following a meal. 180 tablet 3  . pantoprazole (PROTONIX) 40 MG tablet Take 1 tablet by mouth daily.    . potassium chloride SA (K-DUR,KLOR-CON) 20 MEQ tablet Take 1 tablet (20 mEq total) by mouth daily. 30 tablet 2  . prochlorperazine (COMPAZINE) 10 MG tablet Take 1 tablet (10 mg total) by mouth every 6 (six) hours as needed (Nausea or vomiting). 60 tablet 2  . promethazine (PHENERGAN) 25 MG tablet Take 1 tablet (25 mg total) by mouth every 8 (eight) hours as needed for nausea or vomiting. 30 tablet 2  . sacubitril-valsartan (ENTRESTO) 24-26 MG Take 1 tablet by mouth 2 (two) times daily. 180 tablet 3  . tiZANidine (ZANAFLEX) 4 MG tablet Take 1 tablet (4 mg total) by mouth every 8 (eight) hours as needed for muscle spasms. 90 tablet 2  . morphine (MS  CONTIN) 15 MG 12 hr tablet Take 1 tablet (15 mg total) by mouth every 8 (eight) hours. 90 tablet 0   No current facility-administered medications for this visit.    Facility-Administered Medications Ordered in Other Visits  Medication Dose Route Frequency Provider Last Rate Last Dose  . sodium chloride flush (NS) 0.9 % injection 10 mL  10 mL Intravenous PRN Lloyd Huger, MD   10 mL at 03/01/17 0841    OBJECTIVE: Vitals:   01/29/18 0923  BP: 118/77  Pulse: (!) 107  Temp: 97.7 F (36.5 C)     There is no height or weight on file to calculate BMI.    ECOG  FS:1 - Symptomatic but completely ambulatory  General: Well-developed, well-nourished, no acute distress.  Sitting in a wheelchair. Eyes: Pink conjunctiva, anicteric sclera. HEENT: Normocephalic, moist mucous membranes. Lungs: Clear to auscultation bilaterally. Heart: Regular rate and rhythm. No rubs, murmurs, or gallops. Abdomen: Soft, nontender, nondistended. No organomegaly noted, normoactive bowel sounds. Musculoskeletal: No edema, cyanosis, or clubbing. Neuro: Alert, answering all questions appropriately. Cranial nerves grossly intact. Skin: No rashes or petechiae noted. Psych: Normal affect.  LAB RESULTS:  Lab Results  Component Value Date   NA 136 01/29/2018   K 3.4 (L) 01/29/2018   CL 103 01/29/2018   CO2 25 01/29/2018   GLUCOSE 125 (H) 01/29/2018   BUN 13 01/29/2018   CREATININE 0.83 01/29/2018   CALCIUM 8.4 (L) 01/29/2018   PROT 7.6 01/08/2018   ALBUMIN 3.4 (L) 01/08/2018   AST 35 01/08/2018   ALT 34 01/08/2018   ALKPHOS 73 01/08/2018   BILITOT 0.5 01/08/2018   GFRNONAA >60 01/29/2018   GFRAA >60 01/29/2018    Lab Results  Component Value Date   WBC 6.2 01/29/2018   NEUTROABS 3.8 01/29/2018   HGB 9.4 (L) 01/29/2018   HCT 30.0 (L) 01/29/2018   MCV 88.8 01/29/2018   PLT 263 01/29/2018     STUDIES: No results found.  ASSESSMENT: Stage IV small cell lung cancer with liver and bone  metastasis.  PLAN:  1. 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  Patient then received maintenance Tecentriq from Jun 21, 2017 through November 15, 2017.  CT scan results from December 25, 2017 reviewed independently with progressive disease in her mediastinum as well as significant progression of multifocal hepatic disease.  Hospice was briefly discussed, but patient wished to continue aggressive treatment.  Patient was initially scheduled to get topotecan on days 1 through 5 every 21 days, but given her persistent thrombocytopenia she may only be able to receive treatment every 28 days.  Proceed with cycle 2, day 1 of dose reduced topotecan today.  Return to clinic Tuesday through Friday for topotecan only.  Patient will then return weekly for laboratory work and then in 4 weeks for further evaluation and consideration of cycle 3. 2. Pain: Chronic and unchanged.  Patient states she has "fired" her pain doctor.  She was given a prescription for MS Contin 15 mg every 8 hours today. 3. Thrombocytopenia: Resolved.  Proceed with treatment as above. 4.  Anemia: Decreased, but stable at 9.4.  Monitor. 5.  Neutropenia: Resolved.   6.  COPD: Chronic and unchanged.  Continue outpatient palliative care.  Continue current medications as prescribed. 7.  Disposition: Hospice and end-of-life care were previously discussed, but patient wishes to continue aggressive treatments.  She expressed understanding that her treatment options are limited.  She confirms that she is a DNR and is having outpatient palliative care visit.  I spent a total of 30 minutes face-to-face with the patient of which greater than 50% of the visit was spent in counseling and coordination of care as detailed above.   Patient expressed understanding and was in agreement with this plan. She also  understands that She can call clinic at any time with any questions, concerns, or complaints.   Cancer Staging Small cell lung cancer [facility]) Staging form: Lung, AJCC 8th Edition - Clinical stage from 01/28/2017: Stage IV (cT4, cN3, pM1c) - Signed by Grayland Ormond,  Kathlene November, MD on 01/28/2017   Lloyd Huger, MD   01/30/2018 6:55 AM

## 2018-01-29 ENCOUNTER — Other Ambulatory Visit: Payer: Self-pay | Admitting: *Deleted

## 2018-01-29 ENCOUNTER — Other Ambulatory Visit: Payer: Self-pay

## 2018-01-29 ENCOUNTER — Inpatient Hospital Stay (HOSPITAL_BASED_OUTPATIENT_CLINIC_OR_DEPARTMENT_OTHER): Payer: Medicare Other | Admitting: Oncology

## 2018-01-29 ENCOUNTER — Inpatient Hospital Stay: Payer: Medicare Other

## 2018-01-29 VITALS — BP 118/77 | HR 107 | Temp 97.7°F

## 2018-01-29 DIAGNOSIS — Z87891 Personal history of nicotine dependence: Secondary | ICD-10-CM

## 2018-01-29 DIAGNOSIS — G893 Neoplasm related pain (acute) (chronic): Secondary | ICD-10-CM | POA: Diagnosis not present

## 2018-01-29 DIAGNOSIS — R110 Nausea: Secondary | ICD-10-CM | POA: Diagnosis not present

## 2018-01-29 DIAGNOSIS — F329 Major depressive disorder, single episode, unspecified: Secondary | ICD-10-CM

## 2018-01-29 DIAGNOSIS — I10 Essential (primary) hypertension: Secondary | ICD-10-CM

## 2018-01-29 DIAGNOSIS — C7951 Secondary malignant neoplasm of bone: Secondary | ICD-10-CM | POA: Diagnosis not present

## 2018-01-29 DIAGNOSIS — R599 Enlarged lymph nodes, unspecified: Secondary | ICD-10-CM

## 2018-01-29 DIAGNOSIS — Z8701 Personal history of pneumonia (recurrent): Secondary | ICD-10-CM

## 2018-01-29 DIAGNOSIS — C3412 Malignant neoplasm of upper lobe, left bronchus or lung: Secondary | ICD-10-CM

## 2018-01-29 DIAGNOSIS — I429 Cardiomyopathy, unspecified: Secondary | ICD-10-CM

## 2018-01-29 DIAGNOSIS — D649 Anemia, unspecified: Secondary | ICD-10-CM | POA: Diagnosis not present

## 2018-01-29 DIAGNOSIS — J449 Chronic obstructive pulmonary disease, unspecified: Secondary | ICD-10-CM | POA: Diagnosis not present

## 2018-01-29 DIAGNOSIS — J189 Pneumonia, unspecified organism: Secondary | ICD-10-CM | POA: Diagnosis not present

## 2018-01-29 DIAGNOSIS — M48061 Spinal stenosis, lumbar region without neurogenic claudication: Secondary | ICD-10-CM

## 2018-01-29 DIAGNOSIS — Z5111 Encounter for antineoplastic chemotherapy: Secondary | ICD-10-CM

## 2018-01-29 DIAGNOSIS — C787 Secondary malignant neoplasm of liver and intrahepatic bile duct: Secondary | ICD-10-CM | POA: Diagnosis not present

## 2018-01-29 DIAGNOSIS — E785 Hyperlipidemia, unspecified: Secondary | ICD-10-CM

## 2018-01-29 DIAGNOSIS — I509 Heart failure, unspecified: Secondary | ICD-10-CM

## 2018-01-29 DIAGNOSIS — C3490 Malignant neoplasm of unspecified part of unspecified bronchus or lung: Secondary | ICD-10-CM

## 2018-01-29 DIAGNOSIS — K219 Gastro-esophageal reflux disease without esophagitis: Secondary | ICD-10-CM

## 2018-01-29 DIAGNOSIS — M129 Arthropathy, unspecified: Secondary | ICD-10-CM | POA: Diagnosis not present

## 2018-01-29 DIAGNOSIS — Z79899 Other long term (current) drug therapy: Secondary | ICD-10-CM

## 2018-01-29 LAB — BASIC METABOLIC PANEL
Anion gap: 8 (ref 5–15)
BUN: 13 mg/dL (ref 8–23)
CO2: 25 mmol/L (ref 22–32)
Calcium: 8.4 mg/dL — ABNORMAL LOW (ref 8.9–10.3)
Chloride: 103 mmol/L (ref 98–111)
Creatinine, Ser: 0.83 mg/dL (ref 0.44–1.00)
GFR calc Af Amer: >60 mL/min (ref >60)
Glucose, Bld: 125 mg/dL — ABNORMAL HIGH (ref 70–99)
POTASSIUM: 3.4 mmol/L — AB (ref 3.5–5.1)
Sodium: 136 mmol/L (ref 135–145)

## 2018-01-29 LAB — CBC WITH DIFFERENTIAL/PLATELET
Abs Immature Granulocytes: 0.07 10*3/uL (ref 0.00–0.07)
BASOS PCT: 1 %
Basophils Absolute: 0 10*3/uL (ref 0.0–0.1)
Eosinophils Absolute: 0.2 10*3/uL (ref 0.0–0.5)
Eosinophils Relative: 2 %
HCT: 30 % — ABNORMAL LOW (ref 36.0–46.0)
Hemoglobin: 9.4 g/dL — ABNORMAL LOW (ref 12.0–15.0)
Immature Granulocytes: 1 %
Lymphocytes Relative: 19 %
Lymphs Abs: 1.2 10*3/uL (ref 0.7–4.0)
MCH: 27.8 pg (ref 26.0–34.0)
MCHC: 31.3 g/dL (ref 30.0–36.0)
MCV: 88.8 fL (ref 80.0–100.0)
Monocytes Absolute: 0.9 10*3/uL (ref 0.1–1.0)
Monocytes Relative: 15 %
NEUTROS PCT: 62 %
Neutro Abs: 3.8 10*3/uL (ref 1.7–7.7)
Platelets: 263 10*3/uL (ref 150–400)
RBC: 3.38 MIL/uL — AB (ref 3.87–5.11)
RDW: 14.1 % (ref 11.5–15.5)
WBC: 6.2 10*3/uL (ref 4.0–10.5)
nRBC: 0 % (ref 0.0–0.2)

## 2018-01-29 MED ORDER — TOPOTECAN HCL CHEMO INJECTION 4 MG
1.3000 mg/m2 | Freq: Once | INTRAVENOUS | Status: AC
  Administered 2018-01-29: 2.7 mg via INTRAVENOUS
  Filled 2018-01-29: qty 2.7

## 2018-01-29 MED ORDER — HEPARIN SOD (PORK) LOCK FLUSH 100 UNIT/ML IV SOLN
500.0000 [IU] | Freq: Once | INTRAVENOUS | Status: AC
  Administered 2018-01-29: 500 [IU] via INTRAVENOUS
  Filled 2018-01-29: qty 5

## 2018-01-29 MED ORDER — PROCHLORPERAZINE MALEATE 10 MG PO TABS
10.0000 mg | ORAL_TABLET | Freq: Once | ORAL | Status: AC
  Administered 2018-01-29: 10 mg via ORAL
  Filled 2018-01-29: qty 1

## 2018-01-29 MED ORDER — MORPHINE SULFATE ER 15 MG PO TBCR: 15.0000 mg | EXTENDED_RELEASE_TABLET | Freq: Three times a day (TID) | ORAL | 0 refills | Status: DC

## 2018-01-29 MED ORDER — SODIUM CHLORIDE 0.9 % IV SOLN
Freq: Once | INTRAVENOUS | Status: AC
  Administered 2018-01-29: 10:00:00 via INTRAVENOUS
  Filled 2018-01-29: qty 250

## 2018-01-29 MED ORDER — SODIUM CHLORIDE 0.9% FLUSH
10.0000 mL | Freq: Once | INTRAVENOUS | Status: AC
  Administered 2018-01-29: 10 mL via INTRAVENOUS
  Filled 2018-01-29: qty 10

## 2018-01-29 NOTE — Progress Notes (Signed)
Patient is here today to follow up on her small cell lung cancer. Patient also wants to talk about her pain medication.

## 2018-01-30 ENCOUNTER — Inpatient Hospital Stay: Payer: Medicare Other

## 2018-01-30 VITALS — BP 100/67 | HR 80 | Temp 97.8°F | Resp 18

## 2018-01-30 DIAGNOSIS — Z5111 Encounter for antineoplastic chemotherapy: Secondary | ICD-10-CM | POA: Diagnosis not present

## 2018-01-30 DIAGNOSIS — R599 Enlarged lymph nodes, unspecified: Secondary | ICD-10-CM | POA: Diagnosis not present

## 2018-01-30 DIAGNOSIS — C7951 Secondary malignant neoplasm of bone: Secondary | ICD-10-CM | POA: Diagnosis not present

## 2018-01-30 DIAGNOSIS — C787 Secondary malignant neoplasm of liver and intrahepatic bile duct: Secondary | ICD-10-CM | POA: Diagnosis not present

## 2018-01-30 DIAGNOSIS — R110 Nausea: Secondary | ICD-10-CM | POA: Diagnosis not present

## 2018-01-30 DIAGNOSIS — C3412 Malignant neoplasm of upper lobe, left bronchus or lung: Secondary | ICD-10-CM | POA: Diagnosis not present

## 2018-01-30 DIAGNOSIS — C3490 Malignant neoplasm of unspecified part of unspecified bronchus or lung: Secondary | ICD-10-CM

## 2018-01-30 LAB — THYROID PANEL WITH TSH
Free Thyroxine Index: 2.2 (ref 1.2–4.9)
T3 Uptake Ratio: 23 % — ABNORMAL LOW (ref 24–39)
T4, Total: 9.5 ug/dL (ref 4.5–12.0)
TSH: 5.74 u[IU]/mL — ABNORMAL HIGH (ref 0.450–4.500)

## 2018-01-30 MED ORDER — HEPARIN SOD (PORK) LOCK FLUSH 100 UNIT/ML IV SOLN
500.0000 [IU] | Freq: Once | INTRAVENOUS | Status: AC | PRN
  Administered 2018-01-30: 500 [IU]
  Filled 2018-01-30: qty 5

## 2018-01-30 MED ORDER — TOPOTECAN HCL CHEMO INJECTION 4 MG
1.3000 mg/m2 | Freq: Once | INTRAVENOUS | Status: AC
  Administered 2018-01-30: 2.7 mg via INTRAVENOUS
  Filled 2018-01-30: qty 2.7

## 2018-01-30 MED ORDER — SODIUM CHLORIDE 0.9% FLUSH
10.0000 mL | INTRAVENOUS | Status: DC | PRN
  Filled 2018-01-30: qty 10

## 2018-01-30 MED ORDER — SODIUM CHLORIDE 0.9 % IV SOLN
Freq: Once | INTRAVENOUS | Status: AC
  Administered 2018-01-30: 10:00:00 via INTRAVENOUS
  Filled 2018-01-30: qty 250

## 2018-01-30 MED ORDER — PROCHLORPERAZINE MALEATE 10 MG PO TABS
10.0000 mg | ORAL_TABLET | Freq: Once | ORAL | Status: AC
  Administered 2018-01-30: 10 mg via ORAL
  Filled 2018-01-30: qty 1

## 2018-01-31 ENCOUNTER — Inpatient Hospital Stay: Payer: Medicare Other

## 2018-01-31 VITALS — BP 98/63 | HR 74 | Temp 96.2°F | Resp 18

## 2018-01-31 DIAGNOSIS — C3412 Malignant neoplasm of upper lobe, left bronchus or lung: Secondary | ICD-10-CM | POA: Diagnosis not present

## 2018-01-31 DIAGNOSIS — R599 Enlarged lymph nodes, unspecified: Secondary | ICD-10-CM | POA: Diagnosis not present

## 2018-01-31 DIAGNOSIS — Z5111 Encounter for antineoplastic chemotherapy: Secondary | ICD-10-CM | POA: Diagnosis not present

## 2018-01-31 DIAGNOSIS — C7951 Secondary malignant neoplasm of bone: Secondary | ICD-10-CM | POA: Diagnosis not present

## 2018-01-31 DIAGNOSIS — C787 Secondary malignant neoplasm of liver and intrahepatic bile duct: Secondary | ICD-10-CM | POA: Diagnosis not present

## 2018-01-31 DIAGNOSIS — C3490 Malignant neoplasm of unspecified part of unspecified bronchus or lung: Secondary | ICD-10-CM

## 2018-01-31 DIAGNOSIS — R110 Nausea: Secondary | ICD-10-CM | POA: Diagnosis not present

## 2018-01-31 MED ORDER — SODIUM CHLORIDE 0.9 % IV SOLN
Freq: Once | INTRAVENOUS | Status: AC
  Administered 2018-01-31: 09:00:00 via INTRAVENOUS
  Filled 2018-01-31: qty 250

## 2018-01-31 MED ORDER — PROCHLORPERAZINE MALEATE 10 MG PO TABS
10.0000 mg | ORAL_TABLET | Freq: Once | ORAL | Status: AC
  Administered 2018-01-31: 10 mg via ORAL
  Filled 2018-01-31: qty 1

## 2018-01-31 MED ORDER — TOPOTECAN HCL CHEMO INJECTION 4 MG
1.3000 mg/m2 | Freq: Once | INTRAVENOUS | Status: AC
  Administered 2018-01-31: 2.7 mg via INTRAVENOUS
  Filled 2018-01-31: qty 2.7

## 2018-01-31 MED ORDER — HEPARIN SOD (PORK) LOCK FLUSH 100 UNIT/ML IV SOLN
500.0000 [IU] | Freq: Once | INTRAVENOUS | Status: AC | PRN
  Administered 2018-01-31: 500 [IU]
  Filled 2018-01-31: qty 5

## 2018-01-31 NOTE — Progress Notes (Signed)
Patient here for treatment, BP 98/63.  Dr. Grayland Ormond notified, this is patients baseline and he is ok to proceed with treatment. Order to add IVF if patient feels that she needs them.  Patient declined IVF.

## 2018-02-01 ENCOUNTER — Inpatient Hospital Stay: Payer: Medicare Other

## 2018-02-01 VITALS — BP 96/66 | HR 90 | Temp 96.6°F | Resp 18

## 2018-02-01 DIAGNOSIS — C3412 Malignant neoplasm of upper lobe, left bronchus or lung: Secondary | ICD-10-CM | POA: Diagnosis not present

## 2018-02-01 DIAGNOSIS — R599 Enlarged lymph nodes, unspecified: Secondary | ICD-10-CM | POA: Diagnosis not present

## 2018-02-01 DIAGNOSIS — C3490 Malignant neoplasm of unspecified part of unspecified bronchus or lung: Secondary | ICD-10-CM

## 2018-02-01 DIAGNOSIS — R110 Nausea: Secondary | ICD-10-CM | POA: Diagnosis not present

## 2018-02-01 DIAGNOSIS — C787 Secondary malignant neoplasm of liver and intrahepatic bile duct: Secondary | ICD-10-CM | POA: Diagnosis not present

## 2018-02-01 DIAGNOSIS — C7951 Secondary malignant neoplasm of bone: Secondary | ICD-10-CM | POA: Diagnosis not present

## 2018-02-01 DIAGNOSIS — Z5111 Encounter for antineoplastic chemotherapy: Secondary | ICD-10-CM | POA: Diagnosis not present

## 2018-02-01 MED ORDER — PROCHLORPERAZINE MALEATE 10 MG PO TABS
10.0000 mg | ORAL_TABLET | Freq: Once | ORAL | Status: AC
  Administered 2018-02-01: 10 mg via ORAL
  Filled 2018-02-01: qty 1

## 2018-02-01 MED ORDER — SODIUM CHLORIDE 0.9 % IV SOLN
Freq: Once | INTRAVENOUS | Status: AC
  Administered 2018-02-01: 10:00:00 via INTRAVENOUS
  Filled 2018-02-01: qty 250

## 2018-02-01 MED ORDER — TOPOTECAN HCL CHEMO INJECTION 4 MG
1.3000 mg/m2 | Freq: Once | INTRAVENOUS | Status: AC
  Administered 2018-02-01: 2.7 mg via INTRAVENOUS
  Filled 2018-02-01: qty 2.7

## 2018-02-01 MED ORDER — HEPARIN SOD (PORK) LOCK FLUSH 100 UNIT/ML IV SOLN
500.0000 [IU] | Freq: Once | INTRAVENOUS | Status: DC | PRN
  Filled 2018-02-01: qty 5

## 2018-02-02 ENCOUNTER — Inpatient Hospital Stay: Payer: Medicare Other

## 2018-02-02 VITALS — BP 108/68 | HR 75 | Temp 96.3°F | Resp 18

## 2018-02-02 DIAGNOSIS — R110 Nausea: Secondary | ICD-10-CM | POA: Diagnosis not present

## 2018-02-02 DIAGNOSIS — C787 Secondary malignant neoplasm of liver and intrahepatic bile duct: Secondary | ICD-10-CM | POA: Diagnosis not present

## 2018-02-02 DIAGNOSIS — C3490 Malignant neoplasm of unspecified part of unspecified bronchus or lung: Secondary | ICD-10-CM

## 2018-02-02 DIAGNOSIS — C7951 Secondary malignant neoplasm of bone: Secondary | ICD-10-CM | POA: Diagnosis not present

## 2018-02-02 DIAGNOSIS — R599 Enlarged lymph nodes, unspecified: Secondary | ICD-10-CM | POA: Diagnosis not present

## 2018-02-02 DIAGNOSIS — C3412 Malignant neoplasm of upper lobe, left bronchus or lung: Secondary | ICD-10-CM | POA: Diagnosis not present

## 2018-02-02 DIAGNOSIS — Z5111 Encounter for antineoplastic chemotherapy: Secondary | ICD-10-CM | POA: Diagnosis not present

## 2018-02-02 MED ORDER — SODIUM CHLORIDE 0.9% FLUSH
10.0000 mL | INTRAVENOUS | Status: DC | PRN
  Administered 2018-02-02: 10 mL via INTRAVENOUS
  Filled 2018-02-02: qty 10

## 2018-02-02 MED ORDER — SODIUM CHLORIDE 0.9 % IV SOLN
Freq: Once | INTRAVENOUS | Status: AC
  Administered 2018-02-02: 10:00:00 via INTRAVENOUS
  Filled 2018-02-02: qty 250

## 2018-02-02 MED ORDER — PROCHLORPERAZINE MALEATE 10 MG PO TABS
10.0000 mg | ORAL_TABLET | Freq: Once | ORAL | Status: AC
  Administered 2018-02-02: 10 mg via ORAL
  Filled 2018-02-02: qty 1

## 2018-02-02 MED ORDER — TOPOTECAN HCL CHEMO INJECTION 4 MG
1.3000 mg/m2 | Freq: Once | INTRAVENOUS | Status: AC
  Administered 2018-02-02: 2.7 mg via INTRAVENOUS
  Filled 2018-02-02: qty 2.7

## 2018-02-02 MED ORDER — HEPARIN SOD (PORK) LOCK FLUSH 100 UNIT/ML IV SOLN
500.0000 [IU] | Freq: Once | INTRAVENOUS | Status: AC
  Administered 2018-02-02: 500 [IU] via INTRAVENOUS
  Filled 2018-02-02: qty 5

## 2018-02-05 ENCOUNTER — Inpatient Hospital Stay: Payer: Medicare Other

## 2018-02-05 ENCOUNTER — Other Ambulatory Visit: Payer: Self-pay

## 2018-02-05 DIAGNOSIS — R599 Enlarged lymph nodes, unspecified: Secondary | ICD-10-CM | POA: Diagnosis not present

## 2018-02-05 DIAGNOSIS — C787 Secondary malignant neoplasm of liver and intrahepatic bile duct: Secondary | ICD-10-CM | POA: Diagnosis not present

## 2018-02-05 DIAGNOSIS — C3490 Malignant neoplasm of unspecified part of unspecified bronchus or lung: Secondary | ICD-10-CM

## 2018-02-05 DIAGNOSIS — R110 Nausea: Secondary | ICD-10-CM | POA: Diagnosis not present

## 2018-02-05 DIAGNOSIS — Z5111 Encounter for antineoplastic chemotherapy: Secondary | ICD-10-CM | POA: Diagnosis not present

## 2018-02-05 DIAGNOSIS — C3412 Malignant neoplasm of upper lobe, left bronchus or lung: Secondary | ICD-10-CM | POA: Diagnosis not present

## 2018-02-05 DIAGNOSIS — C7951 Secondary malignant neoplasm of bone: Secondary | ICD-10-CM | POA: Diagnosis not present

## 2018-02-05 LAB — BASIC METABOLIC PANEL
Anion gap: 11 (ref 5–15)
BUN: 17 mg/dL (ref 8–23)
CO2: 26 mmol/L (ref 22–32)
Calcium: 9 mg/dL (ref 8.9–10.3)
Chloride: 103 mmol/L (ref 98–111)
Creatinine, Ser: 0.78 mg/dL (ref 0.44–1.00)
GFR calc Af Amer: >60 mL/min (ref >60)
GFR calc non Af Amer: >60 mL/min (ref >60)
Glucose, Bld: 116 mg/dL — ABNORMAL HIGH (ref 70–99)
Potassium: 3.8 mmol/L (ref 3.5–5.1)
Sodium: 140 mmol/L (ref 135–145)

## 2018-02-05 LAB — CBC WITH DIFFERENTIAL/PLATELET
Abs Immature Granulocytes: 0.12 10*3/uL — ABNORMAL HIGH (ref 0.00–0.07)
BASOS ABS: 0 10*3/uL (ref 0.0–0.1)
Basophils Relative: 1 %
Eosinophils Absolute: 0 10*3/uL (ref 0.0–0.5)
Eosinophils Relative: 0 %
HCT: 29.2 % — ABNORMAL LOW (ref 36.0–46.0)
Hemoglobin: 9.1 g/dL — ABNORMAL LOW (ref 12.0–15.0)
Immature Granulocytes: 9 %
LYMPHS PCT: 60 %
Lymphs Abs: 0.8 10*3/uL (ref 0.7–4.0)
MCH: 27.3 pg (ref 26.0–34.0)
MCHC: 31.2 g/dL (ref 30.0–36.0)
MCV: 87.7 fL (ref 80.0–100.0)
Monocytes Absolute: 0 10*3/uL — ABNORMAL LOW (ref 0.1–1.0)
Monocytes Relative: 3 %
Neutro Abs: 0.4 10*3/uL — ABNORMAL LOW (ref 1.7–7.7)
Neutrophils Relative %: 27 %
Platelets: 177 10*3/uL (ref 150–400)
RBC: 3.33 MIL/uL — AB (ref 3.87–5.11)
RDW: 13.5 % (ref 11.5–15.5)
WBC: 1.4 10*3/uL — AB (ref 4.0–10.5)
nRBC: 0 % (ref 0.0–0.2)

## 2018-02-06 LAB — THYROID PANEL WITH TSH
Free Thyroxine Index: 2.3 (ref 1.2–4.9)
T3 UPTAKE RATIO: 23 % — AB (ref 24–39)
T4, Total: 9.8 ug/dL (ref 4.5–12.0)
TSH: 4.64 u[IU]/mL — ABNORMAL HIGH (ref 0.450–4.500)

## 2018-02-09 ENCOUNTER — Other Ambulatory Visit: Payer: Self-pay | Admitting: *Deleted

## 2018-02-09 DIAGNOSIS — C3490 Malignant neoplasm of unspecified part of unspecified bronchus or lung: Secondary | ICD-10-CM

## 2018-02-12 ENCOUNTER — Inpatient Hospital Stay: Payer: Medicare Other

## 2018-02-12 DIAGNOSIS — R110 Nausea: Secondary | ICD-10-CM | POA: Diagnosis not present

## 2018-02-12 DIAGNOSIS — C3490 Malignant neoplasm of unspecified part of unspecified bronchus or lung: Secondary | ICD-10-CM

## 2018-02-12 DIAGNOSIS — C3412 Malignant neoplasm of upper lobe, left bronchus or lung: Secondary | ICD-10-CM | POA: Diagnosis not present

## 2018-02-12 DIAGNOSIS — C7951 Secondary malignant neoplasm of bone: Secondary | ICD-10-CM | POA: Diagnosis not present

## 2018-02-12 DIAGNOSIS — Z5111 Encounter for antineoplastic chemotherapy: Secondary | ICD-10-CM | POA: Diagnosis not present

## 2018-02-12 DIAGNOSIS — R599 Enlarged lymph nodes, unspecified: Secondary | ICD-10-CM | POA: Diagnosis not present

## 2018-02-12 DIAGNOSIS — C787 Secondary malignant neoplasm of liver and intrahepatic bile duct: Secondary | ICD-10-CM | POA: Diagnosis not present

## 2018-02-12 LAB — BASIC METABOLIC PANEL
Anion gap: 10 (ref 5–15)
BUN: 15 mg/dL (ref 8–23)
CO2: 26 mmol/L (ref 22–32)
Calcium: 8.3 mg/dL — ABNORMAL LOW (ref 8.9–10.3)
Chloride: 103 mmol/L (ref 98–111)
Creatinine, Ser: 1 mg/dL (ref 0.44–1.00)
GFR calc non Af Amer: 59 mL/min — ABNORMAL LOW (ref >60)
Glucose, Bld: 119 mg/dL — ABNORMAL HIGH (ref 70–99)
Potassium: 3.8 mmol/L (ref 3.5–5.1)
SODIUM: 139 mmol/L (ref 135–145)

## 2018-02-12 LAB — CBC WITH DIFFERENTIAL/PLATELET
Abs Immature Granulocytes: 0 10*3/uL (ref 0.00–0.07)
BASOS ABS: 0 10*3/uL (ref 0.0–0.1)
Basophils Relative: 0 %
Eosinophils Absolute: 0 10*3/uL (ref 0.0–0.5)
Eosinophils Relative: 1 %
HCT: 25.4 % — ABNORMAL LOW (ref 36.0–46.0)
Hemoglobin: 8 g/dL — ABNORMAL LOW (ref 12.0–15.0)
Lymphocytes Relative: 55 %
Lymphs Abs: 1.3 10*3/uL (ref 0.7–4.0)
MCH: 27.5 pg (ref 26.0–34.0)
MCHC: 31.5 g/dL (ref 30.0–36.0)
MCV: 87.3 fL (ref 80.0–100.0)
Monocytes Absolute: 0.2 10*3/uL (ref 0.1–1.0)
Monocytes Relative: 7 %
Neutro Abs: 0.9 10*3/uL — ABNORMAL LOW (ref 1.7–7.7)
Neutrophils Relative %: 36 %
Other: 1 %
Platelets: 22 10*3/uL — CL (ref 150–400)
RBC: 2.91 MIL/uL — AB (ref 3.87–5.11)
RDW: 13.2 % (ref 11.5–15.5)
WBC: 2.4 10*3/uL — ABNORMAL LOW (ref 4.0–10.5)
nRBC: 0 % (ref 0.0–0.2)

## 2018-02-12 LAB — PATHOLOGIST SMEAR REVIEW

## 2018-02-12 LAB — SAMPLE TO BLOOD BANK

## 2018-02-14 LAB — THYROID PANEL WITH TSH
Free Thyroxine Index: 2.1 (ref 1.2–4.9)
T3 Uptake Ratio: 26 % (ref 24–39)
T4 TOTAL: 7.9 ug/dL (ref 4.5–12.0)
TSH: 6.93 u[IU]/mL — ABNORMAL HIGH (ref 0.450–4.500)

## 2018-02-19 ENCOUNTER — Other Ambulatory Visit: Payer: Self-pay

## 2018-02-19 ENCOUNTER — Inpatient Hospital Stay: Payer: Medicare Other | Attending: Oncology

## 2018-02-19 DIAGNOSIS — I509 Heart failure, unspecified: Secondary | ICD-10-CM | POA: Insufficient documentation

## 2018-02-19 DIAGNOSIS — F329 Major depressive disorder, single episode, unspecified: Secondary | ICD-10-CM | POA: Diagnosis not present

## 2018-02-19 DIAGNOSIS — Z5111 Encounter for antineoplastic chemotherapy: Secondary | ICD-10-CM | POA: Insufficient documentation

## 2018-02-19 DIAGNOSIS — M9901 Segmental and somatic dysfunction of cervical region: Secondary | ICD-10-CM | POA: Diagnosis not present

## 2018-02-19 DIAGNOSIS — C3490 Malignant neoplasm of unspecified part of unspecified bronchus or lung: Secondary | ICD-10-CM | POA: Insufficient documentation

## 2018-02-19 DIAGNOSIS — R531 Weakness: Secondary | ICD-10-CM | POA: Insufficient documentation

## 2018-02-19 DIAGNOSIS — E785 Hyperlipidemia, unspecified: Secondary | ICD-10-CM | POA: Diagnosis not present

## 2018-02-19 DIAGNOSIS — D649 Anemia, unspecified: Secondary | ICD-10-CM | POA: Diagnosis not present

## 2018-02-19 DIAGNOSIS — Z79899 Other long term (current) drug therapy: Secondary | ICD-10-CM | POA: Insufficient documentation

## 2018-02-19 DIAGNOSIS — R5383 Other fatigue: Secondary | ICD-10-CM | POA: Insufficient documentation

## 2018-02-19 DIAGNOSIS — K219 Gastro-esophageal reflux disease without esophagitis: Secondary | ICD-10-CM | POA: Diagnosis not present

## 2018-02-19 DIAGNOSIS — J449 Chronic obstructive pulmonary disease, unspecified: Secondary | ICD-10-CM | POA: Insufficient documentation

## 2018-02-19 DIAGNOSIS — Z87891 Personal history of nicotine dependence: Secondary | ICD-10-CM | POA: Insufficient documentation

## 2018-02-19 DIAGNOSIS — M5417 Radiculopathy, lumbosacral region: Secondary | ICD-10-CM | POA: Diagnosis not present

## 2018-02-19 DIAGNOSIS — M129 Arthropathy, unspecified: Secondary | ICD-10-CM | POA: Insufficient documentation

## 2018-02-19 DIAGNOSIS — C7951 Secondary malignant neoplasm of bone: Secondary | ICD-10-CM | POA: Diagnosis not present

## 2018-02-19 DIAGNOSIS — R110 Nausea: Secondary | ICD-10-CM | POA: Diagnosis not present

## 2018-02-19 DIAGNOSIS — M9903 Segmental and somatic dysfunction of lumbar region: Secondary | ICD-10-CM | POA: Diagnosis not present

## 2018-02-19 DIAGNOSIS — C787 Secondary malignant neoplasm of liver and intrahepatic bile duct: Secondary | ICD-10-CM | POA: Insufficient documentation

## 2018-02-19 DIAGNOSIS — I10 Essential (primary) hypertension: Secondary | ICD-10-CM | POA: Insufficient documentation

## 2018-02-19 DIAGNOSIS — M50122 Cervical disc disorder at C5-C6 level with radiculopathy: Secondary | ICD-10-CM | POA: Diagnosis not present

## 2018-02-19 LAB — CBC WITH DIFFERENTIAL/PLATELET
Abs Immature Granulocytes: 0.09 10*3/uL — ABNORMAL HIGH (ref 0.00–0.07)
BASOS PCT: 0 %
Basophils Absolute: 0 10*3/uL (ref 0.0–0.1)
Eosinophils Absolute: 0 10*3/uL (ref 0.0–0.5)
Eosinophils Relative: 1 %
HCT: 26.2 % — ABNORMAL LOW (ref 36.0–46.0)
Hemoglobin: 8 g/dL — ABNORMAL LOW (ref 12.0–15.0)
Immature Granulocytes: 2 %
Lymphocytes Relative: 39 %
Lymphs Abs: 1.7 10*3/uL (ref 0.7–4.0)
MCH: 28.2 pg (ref 26.0–34.0)
MCHC: 30.5 g/dL (ref 30.0–36.0)
MCV: 92.3 fL (ref 80.0–100.0)
Monocytes Absolute: 0.5 10*3/uL (ref 0.1–1.0)
Monocytes Relative: 12 %
Neutro Abs: 1.9 10*3/uL (ref 1.7–7.7)
Neutrophils Relative %: 46 %
PLATELETS: 142 10*3/uL — AB (ref 150–400)
RBC: 2.84 MIL/uL — ABNORMAL LOW (ref 3.87–5.11)
RDW: 16.4 % — ABNORMAL HIGH (ref 11.5–15.5)
WBC: 4.3 10*3/uL (ref 4.0–10.5)
nRBC: 0.9 % — ABNORMAL HIGH (ref 0.0–0.2)

## 2018-02-19 LAB — BASIC METABOLIC PANEL
ANION GAP: 7 (ref 5–15)
BUN: 12 mg/dL (ref 8–23)
CO2: 24 mmol/L (ref 22–32)
Calcium: 8.2 mg/dL — ABNORMAL LOW (ref 8.9–10.3)
Chloride: 107 mmol/L (ref 98–111)
Creatinine, Ser: 1.04 mg/dL — ABNORMAL HIGH (ref 0.44–1.00)
GFR calc Af Amer: >60 mL/min (ref >60)
GFR calc non Af Amer: 56 mL/min — ABNORMAL LOW (ref >60)
Glucose, Bld: 166 mg/dL — ABNORMAL HIGH (ref 70–99)
Potassium: 3.6 mmol/L (ref 3.5–5.1)
Sodium: 138 mmol/L (ref 135–145)

## 2018-02-19 LAB — SAMPLE TO BLOOD BANK

## 2018-02-20 LAB — THYROID PANEL WITH TSH
Free Thyroxine Index: 1.9 (ref 1.2–4.9)
T3 Uptake Ratio: 25 % (ref 24–39)
T4, Total: 7.7 ug/dL (ref 4.5–12.0)
TSH: 8.47 u[IU]/mL — ABNORMAL HIGH (ref 0.450–4.500)

## 2018-02-23 NOTE — Progress Notes (Signed)
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  INTERVAL HISTORY: Patient returns to clinic today for further evaluation and consideration of cycle 4 of topotecan.  She admits to worsening memory and forgetfulness.  She continues to have chronic weakness and fatigue that is unchanged.  She continues to have occasional nausea.  Her pain is chronic and unchanged as well, but well controlled with her current narcotic regimen.  She has no neurologic complaints.  She denies any recent fevers or illnesses.  She has a good appetite and denies weight loss.  She has chronic shortness of breath, but denies any chest pain or hemoptysis. She has no abdominal pain.  She denies any melena or hematochezia.  She has no urinary complaints.  Patient offers no further specific complaints today.  REVIEW OF SYSTEMS:   Review of Systems  Constitutional: Positive for malaise/fatigue. Negative for fever and weight loss.  Respiratory: Positive for shortness of breath. Negative for cough, hemoptysis and wheezing.   Cardiovascular: Negative.  Negative for chest pain and leg swelling.  Gastrointestinal: Negative.  Negative for abdominal pain, blood in stool, diarrhea, melena, nausea and vomiting.  Genitourinary: Negative.  Negative for dysuria and frequency.  Musculoskeletal: Positive for back pain, joint pain and neck pain.  Skin: Negative.  Negative for rash.  Neurological: Positive for weakness. Negative for sensory change, focal weakness and headaches.  Psychiatric/Behavioral: Positive for memory loss. The patient is not nervous/anxious.     As per HPI. Otherwise, a complete review of systems is negative.  PAST MEDICAL  HISTORY: Past Medical History:  Diagnosis Date  . Anxiety   . Arthritis   . Asthma    COPD  . Cancer ([place])    Liver  . CAP (community acquired pneumonia) 06/30/2014  . Cardiomyopathy ([place])    nonischemic (EF 35-40%)  . CHF (congestive heart failure) ([place])   . COPD (chronic obstructive pulmonary disease) ([place])   . Depression   . Dyspnea   . GERD (gastroesophageal reflux disease)   . Hyperlipidemia   . Hypertension   . Pneumonia   . PVC's (premature ventricular contractions)    states 10 years ago  . Sepsis ([place]) 06/30/2014  . SOB (shortness of breath) 05/08/2014  . Spinal cord injury at T7-T12 level [facility])    2016    PAST SURGICAL HISTORY: Past Surgical History:  Procedure Laterality Date  . APPENDECTOMY    . CARDIAC CATHETERIZATION    . CATARACT EXTRACTION     right eye   . CESAREAN SECTION    . COLONOSCOPY    . LAMINOTOMY    . LUMBAR LAMINECTOMY/DECOMPRESSION MICRODISCECTOMY Left 08/01/2016   Procedure: Left Lumbar Four-Five Laminectomy and Foraminotomy;  Surgeon: Earnie Larsson, MD;  Location: [place];  Service: Neurosurgery;  Laterality: Left;  . PORTA CATH INSERTION N/A 02/03/2017   Procedure: PORTA CATH INSERTION;  Surgeon: Katha Cabal, MD;  Location: [place] [facility];  Service: Cardiovascular;  Laterality: N/A;    FAMILY HISTORY: Family History  Problem Relation Age of Onset  . Heart attack Father 54       MI x 3     ADVANCED DIRECTIVES (Y/N):  N  HEALTH MAINTENANCE: Social History   Tobacco Use  .  Smoking status: Former Smoker    Packs/day: 0.50    Years: 50.00    Pack years: 25.00    Types: Cigarettes    Last attempt to quit: 01/27/2015    Years since quitting: 3.0  . Smokeless tobacco: Never Used  Substance Use Topics  . Alcohol use: No  . Drug use: No     Colonoscopy:  PAP:  Bone density:  Lipid panel:  Allergies  Allergen Reactions  . Fish Allergy Anaphylaxis    Per pt after eating [facility] she had throat swelling and SOB. MSY    . Fentanyl     Overly sedated and groggy, ineffective     Current Outpatient Medications  Medication Sig Dispense Refill  . ADVAIR DISKUS 250-50 MCG/DOSE AEPB Inhale 1 puff into the lungs 2 (two) times daily.    Marland Kitchen albuterol (PROAIR HFA) 108 (90 Base) MCG/ACT inhaler Inhale 2 puffs into the lungs every 6 (six) hours as needed for wheezing or shortness of breath.     Marland Kitchen alendronate (FOSAMAX) 70 MG tablet Take 70 mg by mouth every Thursday.     . ARIPiprazole (ABILIFY) 5 MG tablet Take 5 mg by mouth daily.    Marland Kitchen BELBUCA 150 MCG FILM Take 1 tablet by mouth daily.  0  . benzonatate (TESSALON) 100 MG capsule Take 1 capsule (100 mg total) by mouth every 8 (eight) hours as needed for cough. 20 capsule 0  . diphenoxylate-atropine (LOMOTIL) 2.5-0.025 MG tablet Take 1 tablet by mouth 4 (four) times daily as needed for diarrhea or loose stools. 30 tablet 0  . DULoxetine (CYMBALTA) 60 MG capsule Take 60 mg by mouth daily.    Marland Kitchen ipratropium-albuterol (DUONEB) 0.5-2.5 (3) MG/3ML SOLN Take 3 mLs by nebulization every 4 (four) hours as needed. 360 mL 1  . LORazepam (ATIVAN) 0.5 MG tablet Take 0.5 mg by mouth as needed.    . metoprolol succinate (TOPROL-XL) 50 MG 24 hr tablet Take 1 tablet (50 mg total) by mouth daily. Take with or immediately following a meal. 180 tablet 3  . morphine (MS CONTIN) 15 MG 12 hr tablet Take 1 tablet (15 mg total) by mouth every 8 (eight) hours. 90 tablet 0  . pantoprazole (PROTONIX) 40 MG tablet Take 1 tablet by mouth daily.    . potassium chloride SA (K-DUR,KLOR-CON) 20 MEQ tablet Take 1 tablet (20 mEq total) by mouth daily. 30 tablet 2  . prochlorperazine (COMPAZINE) 10 MG tablet Take 1 tablet (10 mg total) by mouth every 6 (six) hours as needed (Nausea or vomiting). 60 tablet 2  . promethazine (PHENERGAN) 25 MG tablet Take 1 tablet (25 mg total) by mouth every 8 (eight) hours as needed for nausea or vomiting. 30 tablet 2  . sacubitril-valsartan (ENTRESTO) 24-26 MG Take 1 tablet  by mouth 2 (two) times daily. 180 tablet 3  . tiZANidine (ZANAFLEX) 4 MG tablet Take 1 tablet (4 mg total) by mouth every 8 (eight) hours as needed for muscle spasms. 90 tablet 2   No current facility-administered medications for this visit.    Facility-Administered Medications Ordered in Other Visits  Medication Dose Route Frequency Provider Last Rate Last Dose  . sodium chloride flush (NS) 0.9 % injection 10 mL  10 mL Intravenous PRN Lloyd Huger, MD   10 mL at 03/01/17 0841  . sodium chloride flush (NS) 0.9 % injection 10 mL  10 mL Intravenous PRN Lloyd Huger, MD   10 mL at 02/26/18 [phone]  OBJECTIVE: Vitals:   02/26/18 0954  BP: 103/71  Pulse: 89  Resp: 20  Temp: 97.8 F (36.6 C)  SpO2: 93%     Body mass index is 34.28 kg/m.    ECOG FS:2 - Symptomatic, <50% confined to bed  General: Well-developed, well-nourished, no acute distress.  Sitting in a wheelchair Eyes: Pink conjunctiva, anicteric sclera. HEENT: Normocephalic, moist mucous membranes, clear oropharnyx. Lungs: Clear to auscultation bilaterally. Heart: Regular rate and rhythm. No rubs, murmurs, or gallops. Abdomen: Soft, nontender, nondistended. No organomegaly noted, normoactive bowel sounds. Musculoskeletal: No edema, cyanosis, or clubbing. Neuro: Alert, answering all questions appropriately. Cranial nerves grossly intact. Skin: No rashes or petechiae noted. Psych: Normal affect.  LAB RESULTS:  Lab Results  Component Value Date   NA 138 02/26/2018   K 3.4 (L) 02/26/2018   CL 104 02/26/2018   CO2 27 02/26/2018   GLUCOSE 118 (H) 02/26/2018   BUN 11 02/26/2018   CREATININE 0.83 02/26/2018   CALCIUM 8.5 (L) 02/26/2018   PROT 7.6 01/08/2018   ALBUMIN 3.4 (L) 01/08/2018   AST 35 01/08/2018   ALT 34 01/08/2018   ALKPHOS 73 01/08/2018   BILITOT 0.5 01/08/2018   GFRNONAA >60 02/26/2018   GFRAA >60 02/26/2018    Lab Results  Component Value Date   WBC 7.5 02/26/2018   NEUTROABS 5.1  02/26/2018   HGB 8.6 (L) 02/26/2018   HCT 28.7 (L) 02/26/2018   MCV 93.2 02/26/2018   PLT 289 02/26/2018     STUDIES: No results found.  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  PLAN:  1. 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  Patient then received maintenance Tecentriq from Jun 21, 2017 through November 15, 2017.  CT scan results from December 25, 2017 reviewed independently with progressive disease in her mediastinum as well as significant progression of multifocal hepatic disease.  Hospice was briefly discussed, but patient wished to continue aggressive treatment.  Patient was initially scheduled to get topotecan on days 1 through 5 every 21 days, but given her persistent thrombocytopenia she can only receive treatment every 28 days.  Proceed with cycle 2, day 1 of dose reduced topotecan today.  Return to clinic Tuesday through Friday for treatment only.  Patient will then return to clinic in 2 weeks for laboratory work only and then in 4 weeks for further evaluation and consideration of cycle 4.  Will reimage with brain MRI and CT scans prior to cycle 4.   2. Pain: Chronic and unchanged.  Patient states she has "fired" her pain doctor.  Continue MS Contin 15 mg every 8 hours today. 3. Thrombocytopenia: Resolved.  Proceed with treatment as above. 4.  Anemia: Hemoglobin is decreased, but has trended up slightly to 8.6.  Monitor.   5.  Neutropenia: Resolved.   6.  COPD: Chronic and unchanged.  Continue outpatient palliative care.  Continue current medications as prescribed. 7.  Disposition: Hospice and end-of-life care were previously discussed, but patient wishes to continue aggressive treatments.  She expressed understanding that her treatment options are limited.  She confirms that she is a DNR and is  having outpatient palliative care visit.  Patient expressed understanding and was in agreement with this plan. She also understands that She can call clinic at any time with any questions, concerns, or complaints.  Cancer Staging Small cell lung cancer [facility]) Staging form: Lung, AJCC 8th Edition - Clinical stage from 01/28/2017: Stage IV (cT4, cN3, pM1c) - Signed by Lloyd Huger, MD on 01/28/2017   Lloyd Huger, MD   02/26/2018 12:39 PM

## 2018-02-26 ENCOUNTER — Inpatient Hospital Stay (HOSPITAL_BASED_OUTPATIENT_CLINIC_OR_DEPARTMENT_OTHER): Payer: Medicare Other | Admitting: Oncology

## 2018-02-26 ENCOUNTER — Other Ambulatory Visit: Payer: Self-pay | Admitting: *Deleted

## 2018-02-26 ENCOUNTER — Inpatient Hospital Stay: Payer: Medicare Other

## 2018-02-26 ENCOUNTER — Encounter: Payer: Self-pay | Admitting: Oncology

## 2018-02-26 VITALS — BP 103/71 | HR 89 | Temp 97.8°F | Resp 20 | Wt 206.0 lb

## 2018-02-26 DIAGNOSIS — K219 Gastro-esophageal reflux disease without esophagitis: Secondary | ICD-10-CM

## 2018-02-26 DIAGNOSIS — E785 Hyperlipidemia, unspecified: Secondary | ICD-10-CM

## 2018-02-26 DIAGNOSIS — C3490 Malignant neoplasm of unspecified part of unspecified bronchus or lung: Secondary | ICD-10-CM

## 2018-02-26 DIAGNOSIS — Z5111 Encounter for antineoplastic chemotherapy: Secondary | ICD-10-CM | POA: Diagnosis not present

## 2018-02-26 DIAGNOSIS — M129 Arthropathy, unspecified: Secondary | ICD-10-CM

## 2018-02-26 DIAGNOSIS — R110 Nausea: Secondary | ICD-10-CM | POA: Diagnosis not present

## 2018-02-26 DIAGNOSIS — R531 Weakness: Secondary | ICD-10-CM | POA: Diagnosis not present

## 2018-02-26 DIAGNOSIS — F329 Major depressive disorder, single episode, unspecified: Secondary | ICD-10-CM | POA: Diagnosis not present

## 2018-02-26 DIAGNOSIS — C7951 Secondary malignant neoplasm of bone: Secondary | ICD-10-CM | POA: Diagnosis not present

## 2018-02-26 DIAGNOSIS — I509 Heart failure, unspecified: Secondary | ICD-10-CM

## 2018-02-26 DIAGNOSIS — I10 Essential (primary) hypertension: Secondary | ICD-10-CM

## 2018-02-26 DIAGNOSIS — C787 Secondary malignant neoplasm of liver and intrahepatic bile duct: Secondary | ICD-10-CM | POA: Diagnosis not present

## 2018-02-26 DIAGNOSIS — Z79899 Other long term (current) drug therapy: Secondary | ICD-10-CM

## 2018-02-26 DIAGNOSIS — J449 Chronic obstructive pulmonary disease, unspecified: Secondary | ICD-10-CM

## 2018-02-26 DIAGNOSIS — Z87891 Personal history of nicotine dependence: Secondary | ICD-10-CM

## 2018-02-26 DIAGNOSIS — D649 Anemia, unspecified: Secondary | ICD-10-CM

## 2018-02-26 DIAGNOSIS — R5383 Other fatigue: Secondary | ICD-10-CM | POA: Diagnosis not present

## 2018-02-26 LAB — CBC WITH DIFFERENTIAL/PLATELET
ABS IMMATURE GRANULOCYTES: 0.12 10*3/uL — AB (ref 0.00–0.07)
BASOS ABS: 0 10*3/uL (ref 0.0–0.1)
Basophils Relative: 0 %
Eosinophils Absolute: 0 10*3/uL (ref 0.0–0.5)
Eosinophils Relative: 0 %
HCT: 28.7 % — ABNORMAL LOW (ref 36.0–46.0)
Hemoglobin: 8.6 g/dL — ABNORMAL LOW (ref 12.0–15.0)
Immature Granulocytes: 2 %
Lymphocytes Relative: 20 %
Lymphs Abs: 1.5 10*3/uL (ref 0.7–4.0)
MCH: 27.9 pg (ref 26.0–34.0)
MCHC: 30 g/dL (ref 30.0–36.0)
MCV: 93.2 fL (ref 80.0–100.0)
Monocytes Absolute: 0.8 10*3/uL (ref 0.1–1.0)
Monocytes Relative: 11 %
NEUTROS PCT: 67 %
Neutro Abs: 5.1 10*3/uL (ref 1.7–7.7)
Platelets: 289 10*3/uL (ref 150–400)
RBC: 3.08 MIL/uL — ABNORMAL LOW (ref 3.87–5.11)
RDW: 18.9 % — ABNORMAL HIGH (ref 11.5–15.5)
WBC: 7.5 10*3/uL (ref 4.0–10.5)
nRBC: 0.4 % — ABNORMAL HIGH (ref 0.0–0.2)

## 2018-02-26 LAB — BASIC METABOLIC PANEL
Anion gap: 7 (ref 5–15)
BUN: 11 mg/dL (ref 8–23)
CO2: 27 mmol/L (ref 22–32)
Calcium: 8.5 mg/dL — ABNORMAL LOW (ref 8.9–10.3)
Chloride: 104 mmol/L (ref 98–111)
Creatinine, Ser: 0.83 mg/dL (ref 0.44–1.00)
GFR calc Af Amer: >60 mL/min (ref >60)
Glucose, Bld: 118 mg/dL — ABNORMAL HIGH (ref 70–99)
Potassium: 3.4 mmol/L — ABNORMAL LOW (ref 3.5–5.1)
Sodium: 138 mmol/L (ref 135–145)

## 2018-02-26 LAB — SAMPLE TO BLOOD BANK

## 2018-02-26 MED ORDER — TOPOTECAN HCL CHEMO INJECTION 4 MG
1.3000 mg/m2 | Freq: Once | INTRAVENOUS | Status: AC
  Administered 2018-02-26: 2.7 mg via INTRAVENOUS
  Filled 2018-02-26: qty 2.7

## 2018-02-26 MED ORDER — PROCHLORPERAZINE MALEATE 10 MG PO TABS
10.0000 mg | ORAL_TABLET | Freq: Once | ORAL | Status: AC
  Administered 2018-02-26: 10 mg via ORAL
  Filled 2018-02-26: qty 1

## 2018-02-26 MED ORDER — SODIUM CHLORIDE 0.9% FLUSH
10.0000 mL | INTRAVENOUS | Status: DC | PRN
  Administered 2018-02-26: 10 mL via INTRAVENOUS
  Filled 2018-02-26: qty 10

## 2018-02-26 MED ORDER — MORPHINE SULFATE ER 15 MG PO TBCR: 15.0000 mg | EXTENDED_RELEASE_TABLET | Freq: Three times a day (TID) | ORAL | 0 refills | Status: DC

## 2018-02-26 MED ORDER — SODIUM CHLORIDE 0.9 % IV SOLN
Freq: Once | INTRAVENOUS | Status: AC
  Administered 2018-02-26: 11:00:00 via INTRAVENOUS
  Filled 2018-02-26: qty 250

## 2018-02-26 MED ORDER — HEPARIN SOD (PORK) LOCK FLUSH 100 UNIT/ML IV SOLN
500.0000 [IU] | Freq: Once | INTRAVENOUS | Status: AC
  Administered 2018-02-26: 500 [IU] via INTRAVENOUS
  Filled 2018-02-26: qty 5

## 2018-02-26 NOTE — Progress Notes (Signed)
Patient here today for evaluation and treatment with Topotecan. She states that she is more confused and has trouble thinking clearly which she feels started a week or two ago. She denies any headaches or dizziness.

## 2018-02-27 ENCOUNTER — Inpatient Hospital Stay: Payer: Medicare Other

## 2018-02-27 VITALS — BP 106/70 | HR 82 | Temp 97.0°F | Resp 20

## 2018-02-27 DIAGNOSIS — D649 Anemia, unspecified: Secondary | ICD-10-CM | POA: Diagnosis not present

## 2018-02-27 DIAGNOSIS — J449 Chronic obstructive pulmonary disease, unspecified: Secondary | ICD-10-CM | POA: Diagnosis not present

## 2018-02-27 DIAGNOSIS — C3490 Malignant neoplasm of unspecified part of unspecified bronchus or lung: Secondary | ICD-10-CM | POA: Diagnosis not present

## 2018-02-27 DIAGNOSIS — Z5111 Encounter for antineoplastic chemotherapy: Secondary | ICD-10-CM | POA: Diagnosis not present

## 2018-02-27 DIAGNOSIS — C7951 Secondary malignant neoplasm of bone: Secondary | ICD-10-CM | POA: Diagnosis not present

## 2018-02-27 DIAGNOSIS — C787 Secondary malignant neoplasm of liver and intrahepatic bile duct: Secondary | ICD-10-CM | POA: Diagnosis not present

## 2018-02-27 LAB — THYROID PANEL WITH TSH
Free Thyroxine Index: 2 (ref 1.2–4.9)
T3 Uptake Ratio: 22 % — ABNORMAL LOW (ref 24–39)
T4, Total: 8.9 ug/dL (ref 4.5–12.0)
TSH: 5.46 u[IU]/mL — ABNORMAL HIGH (ref 0.450–4.500)

## 2018-02-27 MED ORDER — PROCHLORPERAZINE MALEATE 10 MG PO TABS
10.0000 mg | ORAL_TABLET | Freq: Once | ORAL | Status: AC
  Administered 2018-02-27: 10 mg via ORAL
  Filled 2018-02-27: qty 1

## 2018-02-27 MED ORDER — HEPARIN SOD (PORK) LOCK FLUSH 100 UNIT/ML IV SOLN
500.0000 [IU] | Freq: Once | INTRAVENOUS | Status: AC | PRN
  Administered 2018-02-27: 500 [IU]
  Filled 2018-02-27: qty 5

## 2018-02-27 MED ORDER — SODIUM CHLORIDE 0.9% FLUSH
10.0000 mL | INTRAVENOUS | Status: DC | PRN
  Administered 2018-02-27: 10 mL
  Filled 2018-02-27: qty 10

## 2018-02-27 MED ORDER — SODIUM CHLORIDE 0.9 % IV SOLN
Freq: Once | INTRAVENOUS | Status: AC
  Administered 2018-02-27: 09:00:00 via INTRAVENOUS
  Filled 2018-02-27: qty 250

## 2018-02-27 MED ORDER — TOPOTECAN HCL CHEMO INJECTION 4 MG
1.3000 mg/m2 | Freq: Once | INTRAVENOUS | Status: AC
  Administered 2018-02-27: 2.7 mg via INTRAVENOUS
  Filled 2018-02-27: qty 2.7

## 2018-02-28 ENCOUNTER — Inpatient Hospital Stay: Payer: Medicare Other

## 2018-02-28 VITALS — BP 106/69 | HR 83 | Resp 20 | Wt 206.0 lb

## 2018-02-28 DIAGNOSIS — Z5111 Encounter for antineoplastic chemotherapy: Secondary | ICD-10-CM | POA: Diagnosis not present

## 2018-02-28 DIAGNOSIS — C7951 Secondary malignant neoplasm of bone: Secondary | ICD-10-CM | POA: Diagnosis not present

## 2018-02-28 DIAGNOSIS — C787 Secondary malignant neoplasm of liver and intrahepatic bile duct: Secondary | ICD-10-CM | POA: Diagnosis not present

## 2018-02-28 DIAGNOSIS — D649 Anemia, unspecified: Secondary | ICD-10-CM | POA: Diagnosis not present

## 2018-02-28 DIAGNOSIS — C3490 Malignant neoplasm of unspecified part of unspecified bronchus or lung: Secondary | ICD-10-CM

## 2018-02-28 DIAGNOSIS — J449 Chronic obstructive pulmonary disease, unspecified: Secondary | ICD-10-CM | POA: Diagnosis not present

## 2018-02-28 MED ORDER — PROCHLORPERAZINE MALEATE 10 MG PO TABS
10.0000 mg | ORAL_TABLET | Freq: Once | ORAL | Status: AC
  Administered 2018-02-28: 10 mg via ORAL
  Filled 2018-02-28: qty 1

## 2018-02-28 MED ORDER — TOPOTECAN HCL CHEMO INJECTION 4 MG
1.3000 mg/m2 | Freq: Once | INTRAVENOUS | Status: AC
  Administered 2018-02-28: 2.7 mg via INTRAVENOUS
  Filled 2018-02-28: qty 2.7

## 2018-02-28 MED ORDER — HEPARIN SOD (PORK) LOCK FLUSH 100 UNIT/ML IV SOLN
500.0000 [IU] | Freq: Once | INTRAVENOUS | Status: AC | PRN
  Administered 2018-02-28: 500 [IU]
  Filled 2018-02-28: qty 5

## 2018-02-28 MED ORDER — SODIUM CHLORIDE 0.9 % IV SOLN
Freq: Once | INTRAVENOUS | Status: AC
  Administered 2018-02-28: 09:00:00 via INTRAVENOUS
  Filled 2018-02-28: qty 250

## 2018-03-01 ENCOUNTER — Inpatient Hospital Stay: Payer: Medicare Other

## 2018-03-01 VITALS — BP 108/61 | HR 83 | Temp 98.0°F | Resp 18 | Wt 206.0 lb

## 2018-03-01 DIAGNOSIS — C3490 Malignant neoplasm of unspecified part of unspecified bronchus or lung: Secondary | ICD-10-CM

## 2018-03-01 DIAGNOSIS — C7951 Secondary malignant neoplasm of bone: Secondary | ICD-10-CM | POA: Diagnosis not present

## 2018-03-01 DIAGNOSIS — J449 Chronic obstructive pulmonary disease, unspecified: Secondary | ICD-10-CM | POA: Diagnosis not present

## 2018-03-01 DIAGNOSIS — Z5111 Encounter for antineoplastic chemotherapy: Secondary | ICD-10-CM | POA: Diagnosis not present

## 2018-03-01 DIAGNOSIS — D649 Anemia, unspecified: Secondary | ICD-10-CM | POA: Diagnosis not present

## 2018-03-01 DIAGNOSIS — C787 Secondary malignant neoplasm of liver and intrahepatic bile duct: Secondary | ICD-10-CM | POA: Diagnosis not present

## 2018-03-01 MED ORDER — HEPARIN SOD (PORK) LOCK FLUSH 100 UNIT/ML IV SOLN
500.0000 [IU] | Freq: Once | INTRAVENOUS | Status: AC | PRN
  Administered 2018-03-01: 500 [IU]
  Filled 2018-03-01: qty 5

## 2018-03-01 MED ORDER — PROCHLORPERAZINE MALEATE 10 MG PO TABS
10.0000 mg | ORAL_TABLET | Freq: Once | ORAL | Status: AC
  Administered 2018-03-01: 10 mg via ORAL
  Filled 2018-03-01: qty 1

## 2018-03-01 MED ORDER — SODIUM CHLORIDE 0.9 % IV SOLN
Freq: Once | INTRAVENOUS | Status: AC
  Administered 2018-03-01: 10:00:00 via INTRAVENOUS
  Filled 2018-03-01: qty 250

## 2018-03-01 MED ORDER — TOPOTECAN HCL CHEMO INJECTION 4 MG
1.3000 mg/m2 | Freq: Once | INTRAVENOUS | Status: AC
  Administered 2018-03-01: 2.7 mg via INTRAVENOUS
  Filled 2018-03-01: qty 2.7

## 2018-03-01 NOTE — Progress Notes (Signed)
2 RNs unable to obtain blood return from pt's port. Pt reports no pain during flushing of port and no swelling or redness present at site. Per Tillie Rung, Dr. Grayland Ormond ok to proceed with ordered treatment through port today.

## 2018-03-02 ENCOUNTER — Inpatient Hospital Stay: Payer: Medicare Other

## 2018-03-02 VITALS — BP 93/61 | HR 90 | Temp 97.1°F | Resp 18

## 2018-03-02 DIAGNOSIS — D649 Anemia, unspecified: Secondary | ICD-10-CM | POA: Diagnosis not present

## 2018-03-02 DIAGNOSIS — C7951 Secondary malignant neoplasm of bone: Secondary | ICD-10-CM | POA: Diagnosis not present

## 2018-03-02 DIAGNOSIS — C3490 Malignant neoplasm of unspecified part of unspecified bronchus or lung: Secondary | ICD-10-CM | POA: Diagnosis not present

## 2018-03-02 DIAGNOSIS — C787 Secondary malignant neoplasm of liver and intrahepatic bile duct: Secondary | ICD-10-CM | POA: Diagnosis not present

## 2018-03-02 DIAGNOSIS — Z5111 Encounter for antineoplastic chemotherapy: Secondary | ICD-10-CM | POA: Diagnosis not present

## 2018-03-02 DIAGNOSIS — J449 Chronic obstructive pulmonary disease, unspecified: Secondary | ICD-10-CM | POA: Diagnosis not present

## 2018-03-02 MED ORDER — TOPOTECAN HCL CHEMO INJECTION 4 MG
1.3000 mg/m2 | Freq: Once | INTRAVENOUS | Status: AC
  Administered 2018-03-02: 2.7 mg via INTRAVENOUS
  Filled 2018-03-02: qty 2.7

## 2018-03-02 MED ORDER — SODIUM CHLORIDE 0.9 % IV SOLN
Freq: Once | INTRAVENOUS | Status: AC
  Administered 2018-03-02: 09:00:00 via INTRAVENOUS
  Filled 2018-03-02: qty 250

## 2018-03-02 MED ORDER — HEPARIN SOD (PORK) LOCK FLUSH 100 UNIT/ML IV SOLN
500.0000 [IU] | Freq: Once | INTRAVENOUS | Status: AC | PRN
  Administered 2018-03-02: 500 [IU]
  Filled 2018-03-02: qty 5

## 2018-03-02 MED ORDER — PROCHLORPERAZINE MALEATE 10 MG PO TABS
10.0000 mg | ORAL_TABLET | Freq: Once | ORAL | Status: AC
  Administered 2018-03-02: 10 mg via ORAL
  Filled 2018-03-02: qty 1

## 2018-03-08 ENCOUNTER — Other Ambulatory Visit: Payer: Self-pay | Admitting: Pain Medicine

## 2018-03-08 DIAGNOSIS — M7918 Myalgia, other site: Principal | ICD-10-CM

## 2018-03-08 DIAGNOSIS — G8929 Other chronic pain: Secondary | ICD-10-CM

## 2018-03-12 ENCOUNTER — Inpatient Hospital Stay: Payer: Medicare Other

## 2018-03-12 DIAGNOSIS — D649 Anemia, unspecified: Secondary | ICD-10-CM | POA: Diagnosis not present

## 2018-03-12 DIAGNOSIS — J449 Chronic obstructive pulmonary disease, unspecified: Secondary | ICD-10-CM | POA: Diagnosis not present

## 2018-03-12 DIAGNOSIS — C787 Secondary malignant neoplasm of liver and intrahepatic bile duct: Secondary | ICD-10-CM | POA: Diagnosis not present

## 2018-03-12 DIAGNOSIS — Z5111 Encounter for antineoplastic chemotherapy: Secondary | ICD-10-CM | POA: Diagnosis not present

## 2018-03-12 DIAGNOSIS — C3490 Malignant neoplasm of unspecified part of unspecified bronchus or lung: Secondary | ICD-10-CM | POA: Diagnosis not present

## 2018-03-12 DIAGNOSIS — C7951 Secondary malignant neoplasm of bone: Secondary | ICD-10-CM | POA: Diagnosis not present

## 2018-03-12 LAB — CBC WITH DIFFERENTIAL/PLATELET
Abs Immature Granulocytes: 0.01 10*3/uL (ref 0.00–0.07)
BASOS PCT: 0 %
Basophils Absolute: 0 10*3/uL (ref 0.0–0.1)
Eosinophils Absolute: 0 10*3/uL (ref 0.0–0.5)
Eosinophils Relative: 1 %
HCT: 20.8 % — ABNORMAL LOW (ref 36.0–46.0)
Hemoglobin: 6.5 g/dL — ABNORMAL LOW (ref 12.0–15.0)
Immature Granulocytes: 1 %
Lymphocytes Relative: 52 %
Lymphs Abs: 0.9 10*3/uL (ref 0.7–4.0)
MCH: 28.3 pg (ref 26.0–34.0)
MCHC: 31.3 g/dL (ref 30.0–36.0)
MCV: 90.4 fL (ref 80.0–100.0)
Monocytes Absolute: 0.2 10*3/uL (ref 0.1–1.0)
Monocytes Relative: 9 %
NEUTROS PCT: 37 %
Neutro Abs: 0.6 10*3/uL — ABNORMAL LOW (ref 1.7–7.7)
Platelets: 23 10*3/uL — CL (ref 150–400)
RBC: 2.3 MIL/uL — AB (ref 3.87–5.11)
RDW: 16 % — ABNORMAL HIGH (ref 11.5–15.5)
Smear Review: DECREASED
WBC: 1.6 10*3/uL — ABNORMAL LOW (ref 4.0–10.5)
nRBC: 0 % (ref 0.0–0.2)

## 2018-03-12 LAB — SAMPLE TO BLOOD BANK

## 2018-03-12 LAB — BASIC METABOLIC PANEL
ANION GAP: 10 (ref 5–15)
BUN: 12 mg/dL (ref 8–23)
CO2: 25 mmol/L (ref 22–32)
Calcium: 8.4 mg/dL — ABNORMAL LOW (ref 8.9–10.3)
Chloride: 103 mmol/L (ref 98–111)
Creatinine, Ser: 0.9 mg/dL (ref 0.44–1.00)
GFR calc non Af Amer: >60 mL/min (ref >60)
Glucose, Bld: 126 mg/dL — ABNORMAL HIGH (ref 70–99)
Potassium: 3.2 mmol/L — ABNORMAL LOW (ref 3.5–5.1)
Sodium: 138 mmol/L (ref 135–145)

## 2018-03-13 LAB — THYROID PANEL WITH TSH
Free Thyroxine Index: 2.3 (ref 1.2–4.9)
T3 Uptake Ratio: 27 % (ref 24–39)
T4, Total: 8.7 ug/dL (ref 4.5–12.0)
TSH: 6.79 u[IU]/mL — ABNORMAL HIGH (ref 0.450–4.500)

## 2018-03-21 DIAGNOSIS — M9901 Segmental and somatic dysfunction of cervical region: Secondary | ICD-10-CM | POA: Diagnosis not present

## 2018-03-21 DIAGNOSIS — M50122 Cervical disc disorder at C5-C6 level with radiculopathy: Secondary | ICD-10-CM | POA: Diagnosis not present

## 2018-03-21 DIAGNOSIS — M5417 Radiculopathy, lumbosacral region: Secondary | ICD-10-CM | POA: Diagnosis not present

## 2018-03-21 DIAGNOSIS — M9903 Segmental and somatic dysfunction of lumbar region: Secondary | ICD-10-CM | POA: Diagnosis not present

## 2018-03-22 ENCOUNTER — Ambulatory Visit
Admission: RE | Admit: 2018-03-22 | Discharge: 2018-03-22 | Disposition: A | Discharge status: Home / Self Care | Payer: Medicare Other | Source: Ambulatory Visit | Attending: Oncology | Admitting: Oncology

## 2018-03-22 DIAGNOSIS — C3490 Malignant neoplasm of unspecified part of unspecified bronchus or lung: Secondary | ICD-10-CM | POA: Diagnosis not present

## 2018-03-22 DIAGNOSIS — C7951 Secondary malignant neoplasm of bone: Secondary | ICD-10-CM | POA: Diagnosis not present

## 2018-03-22 MED ORDER — GADOBUTROL 1 MMOL/ML IV SOLN
9.0000 mL | Freq: Once | INTRAVENOUS | Status: AC | PRN
  Administered 2018-03-22: 9 mL via INTRAVENOUS

## 2018-03-22 MED ORDER — IOPAMIDOL (ISOVUE-300) INJECTION 61%
100.0000 mL | Freq: Once | INTRAVENOUS | Status: AC | PRN
  Administered 2018-03-22: 100 mL via INTRAVENOUS

## 2018-03-26 ENCOUNTER — Encounter: Payer: Self-pay | Admitting: Nurse Practitioner

## 2018-03-26 ENCOUNTER — Inpatient Hospital Stay: Payer: Medicare Other | Attending: Nurse Practitioner

## 2018-03-26 ENCOUNTER — Inpatient Hospital Stay: Payer: Medicare Other

## 2018-03-26 ENCOUNTER — Inpatient Hospital Stay (HOSPITAL_BASED_OUTPATIENT_CLINIC_OR_DEPARTMENT_OTHER): Payer: Medicare Other | Admitting: Nurse Practitioner

## 2018-03-26 ENCOUNTER — Other Ambulatory Visit: Payer: Self-pay

## 2018-03-26 ENCOUNTER — Inpatient Hospital Stay: Payer: Medicare Other | Admitting: Oncology

## 2018-03-26 ENCOUNTER — Ambulatory Visit: Payer: Medicare Other | Admitting: Oncology

## 2018-03-26 VITALS — BP 129/79 | HR 91 | Temp 96.9°F | Resp 22

## 2018-03-26 DIAGNOSIS — F329 Major depressive disorder, single episode, unspecified: Secondary | ICD-10-CM | POA: Insufficient documentation

## 2018-03-26 DIAGNOSIS — I2510 Atherosclerotic heart disease of native coronary artery without angina pectoris: Secondary | ICD-10-CM | POA: Insufficient documentation

## 2018-03-26 DIAGNOSIS — C3490 Malignant neoplasm of unspecified part of unspecified bronchus or lung: Secondary | ICD-10-CM

## 2018-03-26 DIAGNOSIS — Z87891 Personal history of nicotine dependence: Secondary | ICD-10-CM

## 2018-03-26 DIAGNOSIS — C3412 Malignant neoplasm of upper lobe, left bronchus or lung: Secondary | ICD-10-CM

## 2018-03-26 DIAGNOSIS — I700 Atherosclerosis of aorta: Secondary | ICD-10-CM | POA: Insufficient documentation

## 2018-03-26 DIAGNOSIS — I429 Cardiomyopathy, unspecified: Secondary | ICD-10-CM

## 2018-03-26 DIAGNOSIS — T451X5A Adverse effect of antineoplastic and immunosuppressive drugs, initial encounter: Secondary | ICD-10-CM | POA: Diagnosis not present

## 2018-03-26 DIAGNOSIS — I509 Heart failure, unspecified: Secondary | ICD-10-CM | POA: Diagnosis not present

## 2018-03-26 DIAGNOSIS — Z79899 Other long term (current) drug therapy: Secondary | ICD-10-CM | POA: Insufficient documentation

## 2018-03-26 DIAGNOSIS — C787 Secondary malignant neoplasm of liver and intrahepatic bile duct: Secondary | ICD-10-CM | POA: Insufficient documentation

## 2018-03-26 DIAGNOSIS — R410 Disorientation, unspecified: Secondary | ICD-10-CM

## 2018-03-26 DIAGNOSIS — R0981 Nasal congestion: Secondary | ICD-10-CM | POA: Insufficient documentation

## 2018-03-26 DIAGNOSIS — D6959 Other secondary thrombocytopenia: Secondary | ICD-10-CM | POA: Insufficient documentation

## 2018-03-26 DIAGNOSIS — E785 Hyperlipidemia, unspecified: Secondary | ICD-10-CM | POA: Diagnosis not present

## 2018-03-26 DIAGNOSIS — E876 Hypokalemia: Secondary | ICD-10-CM | POA: Diagnosis not present

## 2018-03-26 DIAGNOSIS — K219 Gastro-esophageal reflux disease without esophagitis: Secondary | ICD-10-CM | POA: Diagnosis not present

## 2018-03-26 DIAGNOSIS — C7951 Secondary malignant neoplasm of bone: Secondary | ICD-10-CM

## 2018-03-26 DIAGNOSIS — I110 Hypertensive heart disease with heart failure: Secondary | ICD-10-CM | POA: Insufficient documentation

## 2018-03-26 DIAGNOSIS — J449 Chronic obstructive pulmonary disease, unspecified: Secondary | ICD-10-CM | POA: Insufficient documentation

## 2018-03-26 DIAGNOSIS — K8689 Other specified diseases of pancreas: Secondary | ICD-10-CM

## 2018-03-26 DIAGNOSIS — Z95828 Presence of other vascular implants and grafts: Secondary | ICD-10-CM

## 2018-03-26 LAB — BASIC METABOLIC PANEL
Anion gap: 4 — ABNORMAL LOW (ref 5–15)
BUN: 10 mg/dL (ref 8–23)
CALCIUM: 8 mg/dL — AB (ref 8.9–10.3)
CO2: 24 mmol/L (ref 22–32)
Chloride: 109 mmol/L (ref 98–111)
Creatinine, Ser: 0.84 mg/dL (ref 0.44–1.00)
GFR calc Af Amer: >60 mL/min (ref >60)
GFR calc non Af Amer: >60 mL/min (ref >60)
Glucose, Bld: 159 mg/dL — ABNORMAL HIGH (ref 70–99)
Potassium: 3.2 mmol/L — ABNORMAL LOW (ref 3.5–5.1)
Sodium: 137 mmol/L (ref 135–145)

## 2018-03-26 LAB — CBC WITH DIFFERENTIAL/PLATELET
Abs Immature Granulocytes: 0.05 10*3/uL (ref 0.00–0.07)
Basophils Absolute: 0.1 10*3/uL (ref 0.0–0.1)
Basophils Relative: 1 %
Eosinophils Absolute: 0 10*3/uL (ref 0.0–0.5)
Eosinophils Relative: 1 %
HCT: 31.4 % — ABNORMAL LOW (ref 36.0–46.0)
Hemoglobin: 9.1 g/dL — ABNORMAL LOW (ref 12.0–15.0)
Immature Granulocytes: 1 %
Lymphocytes Relative: 19 %
Lymphs Abs: 1.5 10*3/uL (ref 0.7–4.0)
MCH: 28.3 pg (ref 26.0–34.0)
MCHC: 29 g/dL — ABNORMAL LOW (ref 30.0–36.0)
MCV: 97.5 fL (ref 80.0–100.0)
Monocytes Absolute: 0.7 10*3/uL (ref 0.1–1.0)
Monocytes Relative: 9 %
Neutro Abs: 5.4 10*3/uL (ref 1.7–7.7)
Neutrophils Relative %: 69 %
Platelets: 359 10*3/uL (ref 150–400)
RBC: 3.22 MIL/uL — AB (ref 3.87–5.11)
RDW: 21.2 % — AB (ref 11.5–15.5)
WBC: 7.8 10*3/uL (ref 4.0–10.5)
nRBC: 0 % (ref 0.0–0.2)

## 2018-03-26 LAB — SAMPLE TO BLOOD BANK

## 2018-03-26 MED ORDER — HEPARIN SOD (PORK) LOCK FLUSH 100 UNIT/ML IV SOLN
500.0000 [IU] | Freq: Once | INTRAVENOUS | Status: AC
  Administered 2018-03-26: 500 [IU] via INTRAVENOUS
  Filled 2018-03-26: qty 5

## 2018-03-26 MED ORDER — TOPOTECAN HCL CHEMO INJECTION 4 MG
1.3000 mg/m2 | Freq: Once | INTRAVENOUS | Status: AC
  Administered 2018-03-26: 2.7 mg via INTRAVENOUS
  Filled 2018-03-26: qty 2.7

## 2018-03-26 MED ORDER — SODIUM CHLORIDE 0.9% FLUSH
10.0000 mL | Freq: Once | INTRAVENOUS | Status: AC
  Administered 2018-03-26: 10 mL via INTRAVENOUS
  Filled 2018-03-26: qty 10

## 2018-03-26 MED ORDER — PROCHLORPERAZINE MALEATE 10 MG PO TABS
10.0000 mg | ORAL_TABLET | Freq: Once | ORAL | Status: AC
  Administered 2018-03-26: 10 mg via ORAL
  Filled 2018-03-26: qty 1

## 2018-03-26 MED ORDER — SODIUM CHLORIDE 0.9 % IV SOLN
Freq: Once | INTRAVENOUS | Status: AC
  Administered 2018-03-26: 11:00:00 via INTRAVENOUS
  Filled 2018-03-26: qty 250

## 2018-03-26 NOTE — Progress Notes (Signed)
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  She reports sinus congestion that started 3 days ago with sore throat.  Has been taking Mucinex at home with some improvement.  Has not been taking her potassium pill. She feels her cough has improved since starting treatment. Her pain is chronic and unchanged but reports well controlled with current regimen. Denies constipation.  Denies neurologic complaints. Reports appetite is stable and denies weight loss though she refuses weighing today. Says she is tired and ready to proceed with treatment. She has chronic shortness of breath with exertion but denies chest pain or hemoptysis. Denies abdominal pain. Denies fever or chills. Denies melena or hematochezia. Denies urinary complaints. Denies further specific complaints today.   REVIEW OF SYSTEMS:   Review of Systems  Constitutional: Positive for malaise/fatigue. Negative for fever and weight loss.  HENT: Positive for congestion, sinus pain and sore throat. Negative for ear pain and nosebleeds.   Eyes: Negative.  Negative for pain, discharge and redness.  Respiratory: Positive for shortness of breath. Negative for cough, hemoptysis, wheezing and stridor.   Cardiovascular: Negative.  Negative for chest pain and leg swelling.  Gastrointestinal: Negative.  Negative for abdominal pain, blood in stool, diarrhea, melena, nausea and vomiting.  Genitourinary: Negative.  Negative for dysuria and frequency.  Musculoskeletal: Positive for back pain, joint pain and neck  pain.  Skin: Negative.  Negative for rash.  Neurological: Positive for weakness. Negative for sensory change, focal weakness and headaches.  Psychiatric/Behavioral: Positive for memory loss. The patient is not nervous/anxious.    As per HPI. Otherwise, a complete review of systems is negative.  PAST MEDICAL HISTORY: Past Medical History:  Diagnosis Date  . Anxiety   . Arthritis   . Asthma    COPD  . Cancer ([place])    Liver  . CAP (community acquired pneumonia) 06/30/2014  . Cardiomyopathy ([place])    nonischemic (EF 35-40%)  . CHF (congestive heart failure) ([place])   . COPD (chronic obstructive pulmonary disease) ([place])   . Depression   . Dyspnea   . GERD (gastroesophageal reflux disease)   . Hyperlipidemia   . Hypertension   . Pneumonia   . PVC's (premature ventricular contractions)    states 10 years ago  . Sepsis ([place]) 06/30/2014  . SOB (shortness of breath) 05/08/2014  . Spinal cord injury at T7-T12 level [facility])    2016    PAST SURGICAL HISTORY: Past Surgical History:  Procedure Laterality Date  . APPENDECTOMY    . CARDIAC CATHETERIZATION    . CATARACT EXTRACTION     right eye   . CESAREAN SECTION    . COLONOSCOPY    . LAMINOTOMY    . LUMBAR LAMINECTOMY/DECOMPRESSION MICRODISCECTOMY Left 08/01/2016   Procedure: Left Lumbar Four-Five Laminectomy and Foraminotomy;  Surgeon: Earnie Larsson, MD;  Location: [place];  Service: Neurosurgery;  Laterality: Left;  . PORTA CATH INSERTION N/A 02/03/2017   Procedure: PORTA CATH INSERTION;  Surgeon: Katha Cabal, MD;  Location: [place] [facility];  Service: Cardiovascular;  Laterality: N/A;    FAMILY HISTORY: Family History  Problem Relation Age of Onset  . Heart attack Father 5       MI x 3     ADVANCED DIRECTIVES (Y/N):  N - per patient she has DNR filed. Requested copy.   HEALTH MAINTENANCE: Social History   Tobacco Use  . Smoking status: Former Smoker    Packs/day: 0.50    Years: 50.00    Pack years: 25.00     Types: Cigarettes    Last attempt to quit: 01/27/2015    Years since quitting: 3.1  . Smokeless tobacco: Never Used  Substance Use Topics  . Alcohol use: No  . Drug use: No    Colonoscopy:  PAP:  Bone density:  Lipid panel:  Allergies  Allergen Reactions  . Fish Allergy Anaphylaxis    Per pt after eating [facility] she had throat swelling and SOB. MSY   . Fentanyl     Overly sedated and groggy, ineffective     Current Outpatient Medications  Medication Sig Dispense Refill  . ADVAIR DISKUS 250-50 MCG/DOSE AEPB Inhale 1 puff into the lungs 2 (two) times daily.    Marland Kitchen albuterol (PROAIR HFA) 108 (90 Base) MCG/ACT inhaler Inhale 2 puffs into the lungs every 6 (six) hours as needed for wheezing or shortness of breath.     Marland Kitchen alendronate (FOSAMAX) 70 MG tablet Take 70 mg by mouth every Thursday.     . ARIPiprazole (ABILIFY) 5 MG tablet Take 5 mg by mouth daily.    Marland Kitchen BELBUCA 150 MCG FILM Take 1 tablet by mouth daily.  0  . dextromethorphan-guaiFENesin (MUCINEX DM) 30-600 MG 12hr tablet Take 1 tablet by mouth 2 (two) times daily as needed for cough.    . diphenoxylate-atropine (LOMOTIL) 2.5-0.025 MG tablet Take 1 tablet by mouth 4 (four) times daily as needed for diarrhea or loose stools. 30 tablet 0  . DULoxetine (CYMBALTA) 60 MG capsule Take 60 mg by mouth daily.    Marland Kitchen ipratropium-albuterol (DUONEB) 0.5-2.5 (3) MG/3ML SOLN Take 3 mLs by nebulization every 4 (four) hours as needed. 360 mL 1  . LORazepam (ATIVAN) 0.5 MG tablet Take 0.5 mg by mouth as needed.    . metoprolol succinate (TOPROL-XL) 50 MG 24 hr tablet Take 1 tablet (50 mg total) by mouth daily. Take with or immediately following a meal. 180 tablet 3  . morphine (MS CONTIN) 15 MG 12 hr tablet Take 1 tablet (15 mg total) by mouth every 8 (eight) hours. 90 tablet 0  . pantoprazole (PROTONIX) 40 MG tablet Take 1 tablet by mouth daily.    . potassium chloride SA (K-DUR,KLOR-CON) 20 MEQ tablet Take 1 tablet (20 mEq total) by mouth daily.  30 tablet 2  . prochlorperazine (COMPAZINE) 10 MG tablet Take 1 tablet (10 mg total) by mouth every 6 (six) hours as needed (Nausea or vomiting). 60 tablet 2  . promethazine (PHENERGAN) 25 MG tablet Take 1 tablet (25 mg total) by mouth every 8 (eight) hours as needed for nausea or vomiting. 30 tablet 2  . sacubitril-valsartan (ENTRESTO) 24-26 MG Take 1 tablet by mouth 2 (two) times daily. 180 tablet 3  . tiZANidine (ZANAFLEX) 4 MG tablet Take 1 tablet (4 mg total) by mouth every 8 (eight) hours as needed for muscle spasms. 90 tablet 2  . benzonatate (TESSALON) 100 MG capsule Take 1 capsule (100 mg total) by mouth every 8 (eight) hours as needed for cough. (Patient not taking: Reported on 03/26/2018) 20 capsule 0   No current  facility-administered medications for this visit.    Facility-Administered Medications Ordered in Other Visits  Medication Dose Route Frequency Provider Last Rate Last Dose  . sodium chloride flush (NS) 0.9 % injection 10 mL  10 mL Intravenous PRN Lloyd Huger, MD   10 mL at 03/01/17 0841    OBJECTIVE: Vitals:   03/26/18 0949  BP: 129/79  Pulse: 91  Resp: (!) 22  Temp: (!) 96.9 F (36.1 C)  SpO2: 96%     There is no height or weight on file to calculate BMI.     ECOG FS:2 - Symptomatic, <50% confined to bed  General: Well-developed, well-nourished, no acute distress. Sitting in wheelchair. Accompanied.  Eyes: Pink conjunctiva, anicteric sclera. HEENT: right sinus ttp, right turbinate inflamed. Throat clear- no erythema or exudate. Cerumen impaction bilaterally. Lungs: Clear to auscultation bilaterally Heart: Regular rate and rhythm. No rubs, murmurs, or gallops Abdomen: Soft, nontender, nondistended. No organomegaly noted, normoactive bowel sounds Musculoskeletal: No edema, cyanosis, or clubbing Neuro: Alert, answering all questions appropriately. Cranial nerves grossly intact Skin: No rashes or petechiae noted Psych: Normal affect   LAB  RESULTS:  Lab Results  Component Value Date   NA 137 03/26/2018   K 3.2 (L) 03/26/2018   CL 109 03/26/2018   CO2 24 03/26/2018   GLUCOSE 159 (H) 03/26/2018   BUN 10 03/26/2018   CREATININE 0.84 03/26/2018   CALCIUM 8.0 (L) 03/26/2018   PROT 7.6 01/08/2018   ALBUMIN 3.4 (L) 01/08/2018   AST 35 01/08/2018   ALT 34 01/08/2018   ALKPHOS 73 01/08/2018   BILITOT 0.5 01/08/2018   GFRNONAA >60 03/26/2018   GFRAA >60 03/26/2018    Lab Results  Component Value Date   WBC 7.8 03/26/2018   NEUTROABS 5.4 03/26/2018   HGB 9.1 (L) 03/26/2018   HCT 31.4 (L) 03/26/2018   MCV 97.5 03/26/2018   PLT 359 03/26/2018     STUDIES: Ct Chest W Contrast  Result Date: 03/22/2018 CLINICAL DATA:  Small cell lung cancer EXAM: CT CHEST, ABDOMEN, AND PELVIS WITH CONTRAST TECHNIQUE: Multidetector CT imaging of the chest, abdomen and pelvis was performed following the standard protocol during bolus administration of intravenous contrast. CONTRAST:  132mL ISOVUE-300 IOPAMIDOL (ISOVUE-300) INJECTION 61% COMPARISON:  Multiple exams, including 12/25/2017 FINDINGS: CT CHEST FINDINGS Cardiovascular: Port-A-Cath tip: Cavoatrial junction. Coronary, aortic arch, and branch vessel atherosclerotic vascular disease. Mediastinum/Nodes: Right hilar lymph node 1.1 cm in short axis on image 25/2, formerly 0.9 cm. Subcarinal lymph node 0.6 cm in short axis on image 25/2, formerly 1.2 cm. Pericardial lymph node 0.6 cm in short axis on image 36/2, formerly 0.8 cm. Lungs/Pleura: Left suprahilar mass causing obstruction of the apicoposterior segmental bronchus with subsequent postobstructive atelectasis. The atelectasis makes this lesion difficult to measure in long axis, but its anterior-posterior measurement is 2.3 cm and formerly up to 3.4 cm, and subjectively the mass appears significantly reduced in size. Centrilobular emphysema. Mildly irregular mixed density nodule in the right lower lobe measures 1.1 by 0.8 cm on image 70/4,  stable by my measurements. 1.0 by 0.4 cm left lower lobe subpleural nodule on image 96/4 appears stable, and likewise other small areas of scarring minimal nodularity appears stable. Musculoskeletal: Late phase healing left seventh rib fracture with increased bony bridging compared to the prior exam. Stable thoracic compression fractures at T5 and T7 with resulting thoracic kyphosis. Stable pattern of sclerotic bony metastatic lesions especially notable in the spine. CT ABDOMEN PELVIS FINDINGS Hepatobiliary: Prominent improvement in  prior hepatic metastatic disease. For example, a mass in the lateral segment left hepatic lobe measuring 2.7 by 2.0 cm previously measured 6.2 by 5.6 cm. Other masses are similarly reduced. Common hepatic duct 1.6 cm, previously 1.0 cm. Common bile duct 1.3 cm, formerly 0.8 cm. There is also mild but stable dilatation of the dorsal pancreatic duct particularly in the pancreatic head. Uncertain cause of the biliary dilatation. As noted above there is mild but chronic dorsal pancreatic duct dilatation in the pancreatic head. Pancreas: Unremarkable Spleen: Unremarkable Adrenals/Urinary Tract: Unremarkable Stomach/Bowel: Unremarkable Vascular/Lymphatic: Aortoiliac atherosclerotic vascular disease. No pathologic adenopathy identified. Reproductive: Unremarkable Other: Stable 6 mm omental nodule in the left upper quadrant on image 75/2, lack of change of this lesion suggests that it is likely benign. Musculoskeletal: Stable findings of sclerotic metastatic disease. Stable superior endplate compression fracture at L1. No change in the grade 1 anterolisthesis at L4-5 associated with chronic right L4 pars defect and degenerative left facet arthropathy. There is notable disc uncovering at this level contributing to the bilateral foraminal impingement at L4-5. No new bony lesions are observed. IMPRESSION: 1. Overall significant improvement with reduced size of the left upper lobe suprahilar mass,  and prominent improvement in the hepatic metastatic lesions. The left upper lobe mass continues to obstruct the apicoposterior segmental bronchus. 2. Improve subcarinal lymph node, previously 1.2 cm in diameter and currently 0.6 cm. 3. Additional small pulmonary nodules appear stable, including the mildly irregular mixed density nodule in the right lower lobe on image 70/4. 4. Stable pattern of sclerotic osseous metastatic disease without new bony lesions. 5. There is new extrahepatic biliary dilatation, cause uncertain. Currently the common hepatic duct is 1.6 cm in diameter and the common bile duct is 1.3 cm. This could be further investigated with MRCP or ERCP if clinically warranted. 6. Other imaging findings of potential clinical significance: Aortic Atherosclerosis (ICD10-I70.0) and Emphysema (ICD10-J43.9). Coronary atherosclerosis. No change in appearance of compression fractures at T5, T7, and L1. Chronic subluxation due to pars defects and degenerative facet arthropathy at L4-5 resulting in bilateral foraminal impingement. Electronically Signed   By: Van Clines M.D.   On: 03/22/2018 14:46   Mr Jeri Cos IN Contrast  Result Date: 03/22/2018 CLINICAL DATA:  Small cell lung cancer.  Increased confusion. EXAM: MRI HEAD WITHOUT AND WITH CONTRAST TECHNIQUE: Multiplanar, multiecho pulse sequences of the brain and surrounding structures were obtained without and with intravenous contrast. CONTRAST:  9 mL MultiHance IV COMPARISON:  CT head 04/04/2017, MRI head 02/04/2017 FINDINGS: Brain: Mild atrophy. Ventricle size normal. Patchy white matter hyperintensity bilaterally is unchanged from 2018. No acute infarct. Negative for hemorrhage or mass. No enhancing metastatic deposits identified postcontrast administration Vascular: Small enhancing vessel in the right parietal white matter unchanged compatible with developmental venous anomaly. Normal arterial flow voids. Skull and upper cervical spine: 15 mm lesion  in the left parietal bone over the convexity is low signal on T1 and shows mild enhancement. This also shows restricted diffusion. No change from 2018. No destructive lesion on CT. Sinuses/Orbits: Paranasal sinuses clear.  Right cataract surgery Other: None IMPRESSION: Negative for metastatic disease 15 mm lesion left parietal bone unchanged from 2018. Moderate chronic white matter changes stable since 2018. Electronically Signed   By: Franchot Gallo M.D.   On: 03/22/2018 11:49   Ct Abdomen Pelvis W Contrast  Result Date: 03/22/2018 CLINICAL DATA:  Small cell lung cancer EXAM: CT CHEST, ABDOMEN, AND PELVIS WITH CONTRAST TECHNIQUE: Multidetector CT imaging of the  chest, abdomen and pelvis was performed following the standard protocol during bolus administration of intravenous contrast. CONTRAST:  181mL ISOVUE-300 IOPAMIDOL (ISOVUE-300) INJECTION 61% COMPARISON:  Multiple exams, including 12/25/2017 FINDINGS: CT CHEST FINDINGS Cardiovascular: Port-A-Cath tip: Cavoatrial junction. Coronary, aortic arch, and branch vessel atherosclerotic vascular disease. Mediastinum/Nodes: Right hilar lymph node 1.1 cm in short axis on image 25/2, formerly 0.9 cm. Subcarinal lymph node 0.6 cm in short axis on image 25/2, formerly 1.2 cm. Pericardial lymph node 0.6 cm in short axis on image 36/2, formerly 0.8 cm. Lungs/Pleura: Left suprahilar mass causing obstruction of the apicoposterior segmental bronchus with subsequent postobstructive atelectasis. The atelectasis makes this lesion difficult to measure in long axis, but its anterior-posterior measurement is 2.3 cm and formerly up to 3.4 cm, and subjectively the mass appears significantly reduced in size. Centrilobular emphysema. Mildly irregular mixed density nodule in the right lower lobe measures 1.1 by 0.8 cm on image 70/4, stable by my measurements. 1.0 by 0.4 cm left lower lobe subpleural nodule on image 96/4 appears stable, and likewise other small areas of scarring minimal  nodularity appears stable. Musculoskeletal: Late phase healing left seventh rib fracture with increased bony bridging compared to the prior exam. Stable thoracic compression fractures at T5 and T7 with resulting thoracic kyphosis. Stable pattern of sclerotic bony metastatic lesions especially notable in the spine. CT ABDOMEN PELVIS FINDINGS Hepatobiliary: Prominent improvement in prior hepatic metastatic disease. For example, a mass in the lateral segment left hepatic lobe measuring 2.7 by 2.0 cm previously measured 6.2 by 5.6 cm. Other masses are similarly reduced. Common hepatic duct 1.6 cm, previously 1.0 cm. Common bile duct 1.3 cm, formerly 0.8 cm. There is also mild but stable dilatation of the dorsal pancreatic duct particularly in the pancreatic head. Uncertain cause of the biliary dilatation. As noted above there is mild but chronic dorsal pancreatic duct dilatation in the pancreatic head. Pancreas: Unremarkable Spleen: Unremarkable Adrenals/Urinary Tract: Unremarkable Stomach/Bowel: Unremarkable Vascular/Lymphatic: Aortoiliac atherosclerotic vascular disease. No pathologic adenopathy identified. Reproductive: Unremarkable Other: Stable 6 mm omental nodule in the left upper quadrant on image 75/2, lack of change of this lesion suggests that it is likely benign. Musculoskeletal: Stable findings of sclerotic metastatic disease. Stable superior endplate compression fracture at L1. No change in the grade 1 anterolisthesis at L4-5 associated with chronic right L4 pars defect and degenerative left facet arthropathy. There is notable disc uncovering at this level contributing to the bilateral foraminal impingement at L4-5. No new bony lesions are observed. IMPRESSION: 1. Overall significant improvement with reduced size of the left upper lobe suprahilar mass, and prominent improvement in the hepatic metastatic lesions. The left upper lobe mass continues to obstruct the apicoposterior segmental bronchus. 2. Improve  subcarinal lymph node, previously 1.2 cm in diameter and currently 0.6 cm. 3. Additional small pulmonary nodules appear stable, including the mildly irregular mixed density nodule in the right lower lobe on image 70/4. 4. Stable pattern of sclerotic osseous metastatic disease without new bony lesions. 5. There is new extrahepatic biliary dilatation, cause uncertain. Currently the common hepatic duct is 1.6 cm in diameter and the common bile duct is 1.3 cm. This could be further investigated with MRCP or ERCP if clinically warranted. 6. Other imaging findings of potential clinical significance: Aortic Atherosclerosis (ICD10-I70.0) and Emphysema (ICD10-J43.9). Coronary atherosclerosis. No change in appearance of compression fractures at T5, T7, and L1. Chronic subluxation due to pars defects and degenerative facet arthropathy at L4-5 resulting in bilateral foraminal impingement. Electronically Signed  By: Van Clines M.D.   On: 03/22/2018 14:46    ASSESSMENT: Stage IV small cell lung cancer with liver and bone metastasis  PLAN:  1. 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  Patient then received maintenance Tecentriq from Jun 21, 2017 through November 15, 2017.  CT scan from December 25, 2017 showed progressive disease in her mediastinum as well as significant progression of multifocal hepatic disease.  Hospice was briefly discussed, but patient wished to continue aggressive treatment.  Patient was initially scheduled to get topotecan on days 1 through 5 every 21 days, but given her persistent thrombocytopenia she can only receive treatment every 28 days. Interval imaging independently review by Dr. Grayland Ormond. Findings discussed and today discussed with patient. MRI Brain negative for metastatic disease; stable 84mm left parietal bone lesion stable. CT  C/A/P improved to stable.   Proceed with cycle 4, day 1 of dose reduced topotecan today. Return to clinic Tuesday through Friday for Topotecan only. In 2 weeks, rtc for labs only then in 4 weeks for labs and consideration of cycle 5 of topotecan with Dr. Grayland Ormond.  2. Pain: Chronic and unchanged.  Patient states she has "fired" her pain doctor.  Continue MS Contin 15 mg every 8 hours as prescribed. Patient does not request refill today.   3. Thrombocytopenia: Secondary to chemotherapy. Dose adjusted. Plt 359 today. No active bleeding.  Resolved. Continue to monitor.   4.  Anemia: Hmg 9.4, Hct 31.4. Improved. Likely secondary to treatment and chronic disease. Continue to monitor.     5.  Neutropenia: ANC 5.4 today. WBC 7.8. Resolved.  Continue to monitor.   6.  COPD: Chronic and unchanged. Continue medications as prescribed.   7. Bone metastases - last dose 06/21/2017. Overall stable on imaging. Monitor.   8. Sinus congestion- 3 day history of sinus drainage. No fever. Discussed with Dr. Grayland Ormond who recommends holding antibiotics at this time. Patient to call clinic if persistent or progressive symptoms.   9. Hypokalemia- K 3.2 today. Has not been taking oral potassium. Encouraged compliance. Continue to monitor.   10.  Disposition: Hospice and end-of-life care were previously discussed, but patient wishes to continue aggressive treatments.  She expressed understanding that her treatment options are limited.  She confirms that she is a DNR. She has not yet been seen by outpatient palliative care but will refer today for ACP.   Patient expressed understanding and was in agreement with this plan. She also understands that She can call clinic at any time with any questions, concerns, or complaints.   Cancer Staging Small cell lung cancer [facility]) Staging form: Lung, AJCC 8th Edition - Clinical stage from 01/28/2017: Stage IV (cT4, cN3, pM1c) - Signed by Lloyd Huger, MD on  01/28/2017   Verlon Au, NP   03/26/2018 4:21 PM   CC: Dr. Grayland Ormond

## 2018-03-27 ENCOUNTER — Inpatient Hospital Stay: Payer: Medicare Other

## 2018-03-27 VITALS — BP 122/83 | HR 106 | Temp 98.6°F | Resp 18

## 2018-03-27 DIAGNOSIS — C3490 Malignant neoplasm of unspecified part of unspecified bronchus or lung: Secondary | ICD-10-CM

## 2018-03-27 DIAGNOSIS — D6959 Other secondary thrombocytopenia: Secondary | ICD-10-CM | POA: Diagnosis not present

## 2018-03-27 DIAGNOSIS — C787 Secondary malignant neoplasm of liver and intrahepatic bile duct: Secondary | ICD-10-CM | POA: Diagnosis not present

## 2018-03-27 DIAGNOSIS — R0981 Nasal congestion: Secondary | ICD-10-CM | POA: Diagnosis not present

## 2018-03-27 DIAGNOSIS — C3412 Malignant neoplasm of upper lobe, left bronchus or lung: Secondary | ICD-10-CM | POA: Diagnosis not present

## 2018-03-27 DIAGNOSIS — T451X5A Adverse effect of antineoplastic and immunosuppressive drugs, initial encounter: Secondary | ICD-10-CM | POA: Diagnosis not present

## 2018-03-27 DIAGNOSIS — C7951 Secondary malignant neoplasm of bone: Secondary | ICD-10-CM | POA: Diagnosis not present

## 2018-03-27 LAB — THYROID PANEL WITH TSH
FREE THYROXINE INDEX: 1.7 (ref 1.2–4.9)
T3 Uptake Ratio: 23 % — ABNORMAL LOW (ref 24–39)
T4, Total: 7.4 ug/dL (ref 4.5–12.0)
TSH: 6.99 u[IU]/mL — ABNORMAL HIGH (ref 0.450–4.500)

## 2018-03-27 MED ORDER — SODIUM CHLORIDE 0.9 % IV SOLN
Freq: Once | INTRAVENOUS | Status: AC
  Administered 2018-03-27: 11:00:00 via INTRAVENOUS
  Filled 2018-03-27: qty 250

## 2018-03-27 MED ORDER — HEPARIN SOD (PORK) LOCK FLUSH 100 UNIT/ML IV SOLN
INTRAVENOUS | Status: AC
  Filled 2018-03-27: qty 5

## 2018-03-27 MED ORDER — TOPOTECAN HCL CHEMO INJECTION 4 MG
1.3000 mg/m2 | Freq: Once | INTRAVENOUS | Status: AC
  Administered 2018-03-27: 2.7 mg via INTRAVENOUS
  Filled 2018-03-27: qty 2.7

## 2018-03-27 MED ORDER — SODIUM CHLORIDE 0.9% FLUSH
10.0000 mL | INTRAVENOUS | Status: DC | PRN
  Administered 2018-03-27: 10 mL
  Filled 2018-03-27: qty 10

## 2018-03-27 MED ORDER — HEPARIN SOD (PORK) LOCK FLUSH 100 UNIT/ML IV SOLN
500.0000 [IU] | Freq: Once | INTRAVENOUS | Status: AC | PRN
  Administered 2018-03-27: 500 [IU]

## 2018-03-27 MED ORDER — PROCHLORPERAZINE MALEATE 10 MG PO TABS
10.0000 mg | ORAL_TABLET | Freq: Once | ORAL | Status: AC
  Administered 2018-03-27: 10 mg via ORAL
  Filled 2018-03-27: qty 1

## 2018-03-27 NOTE — Progress Notes (Signed)
RN has flushed port with ease, no blood return noted, pt reports no pain while port was flushed.Per Dr Grayland Ormond ok to proceed with treatment using port.   Layci Stenglein [employer]

## 2018-03-28 ENCOUNTER — Inpatient Hospital Stay: Payer: Medicare Other

## 2018-03-29 ENCOUNTER — Inpatient Hospital Stay: Payer: Medicare Other

## 2018-03-29 VITALS — BP 100/66 | HR 69 | Temp 96.6°F | Resp 20

## 2018-03-29 DIAGNOSIS — R0981 Nasal congestion: Secondary | ICD-10-CM | POA: Diagnosis not present

## 2018-03-29 DIAGNOSIS — T451X5A Adverse effect of antineoplastic and immunosuppressive drugs, initial encounter: Secondary | ICD-10-CM | POA: Diagnosis not present

## 2018-03-29 DIAGNOSIS — C787 Secondary malignant neoplasm of liver and intrahepatic bile duct: Secondary | ICD-10-CM | POA: Diagnosis not present

## 2018-03-29 DIAGNOSIS — C7951 Secondary malignant neoplasm of bone: Secondary | ICD-10-CM | POA: Diagnosis not present

## 2018-03-29 DIAGNOSIS — C3412 Malignant neoplasm of upper lobe, left bronchus or lung: Secondary | ICD-10-CM | POA: Diagnosis not present

## 2018-03-29 DIAGNOSIS — C3490 Malignant neoplasm of unspecified part of unspecified bronchus or lung: Secondary | ICD-10-CM

## 2018-03-29 DIAGNOSIS — D6959 Other secondary thrombocytopenia: Secondary | ICD-10-CM | POA: Diagnosis not present

## 2018-03-29 MED ORDER — SODIUM CHLORIDE 0.9 % IV SOLN
Freq: Once | INTRAVENOUS | Status: AC
  Administered 2018-03-29: 09:00:00 via INTRAVENOUS
  Filled 2018-03-29: qty 250

## 2018-03-29 MED ORDER — SODIUM CHLORIDE 0.9% FLUSH
10.0000 mL | INTRAVENOUS | Status: DC | PRN
  Administered 2018-03-29: 10 mL
  Filled 2018-03-29: qty 10

## 2018-03-29 MED ORDER — HEPARIN SOD (PORK) LOCK FLUSH 100 UNIT/ML IV SOLN
500.0000 [IU] | Freq: Once | INTRAVENOUS | Status: AC | PRN
  Administered 2018-03-29: 500 [IU]
  Filled 2018-03-29: qty 5

## 2018-03-29 MED ORDER — PROCHLORPERAZINE MALEATE 10 MG PO TABS
10.0000 mg | ORAL_TABLET | Freq: Once | ORAL | Status: AC
  Administered 2018-03-29: 10 mg via ORAL
  Filled 2018-03-29: qty 1

## 2018-03-29 MED ORDER — TOPOTECAN HCL CHEMO INJECTION 4 MG
1.3000 mg/m2 | Freq: Once | INTRAVENOUS | Status: AC
  Administered 2018-03-29: 2.7 mg via INTRAVENOUS
  Filled 2018-03-29: qty 2.7

## 2018-03-30 ENCOUNTER — Other Ambulatory Visit: Payer: Self-pay | Admitting: *Deleted

## 2018-03-30 ENCOUNTER — Inpatient Hospital Stay: Payer: Medicare Other

## 2018-03-30 VITALS — BP 116/76 | HR 89 | Temp 96.7°F | Resp 20

## 2018-03-30 DIAGNOSIS — R0981 Nasal congestion: Secondary | ICD-10-CM | POA: Diagnosis not present

## 2018-03-30 DIAGNOSIS — D6959 Other secondary thrombocytopenia: Secondary | ICD-10-CM | POA: Diagnosis not present

## 2018-03-30 DIAGNOSIS — C7951 Secondary malignant neoplasm of bone: Secondary | ICD-10-CM | POA: Diagnosis not present

## 2018-03-30 DIAGNOSIS — C787 Secondary malignant neoplasm of liver and intrahepatic bile duct: Secondary | ICD-10-CM | POA: Diagnosis not present

## 2018-03-30 DIAGNOSIS — C3490 Malignant neoplasm of unspecified part of unspecified bronchus or lung: Secondary | ICD-10-CM

## 2018-03-30 DIAGNOSIS — T451X5A Adverse effect of antineoplastic and immunosuppressive drugs, initial encounter: Secondary | ICD-10-CM | POA: Diagnosis not present

## 2018-03-30 DIAGNOSIS — C3412 Malignant neoplasm of upper lobe, left bronchus or lung: Secondary | ICD-10-CM | POA: Diagnosis not present

## 2018-03-30 MED ORDER — SODIUM CHLORIDE 0.9% FLUSH
10.0000 mL | INTRAVENOUS | Status: DC | PRN
  Administered 2018-03-30: 10 mL
  Filled 2018-03-30: qty 10

## 2018-03-30 MED ORDER — PROCHLORPERAZINE MALEATE 10 MG PO TABS
10.0000 mg | ORAL_TABLET | Freq: Once | ORAL | Status: AC
  Administered 2018-03-30: 10 mg via ORAL
  Filled 2018-03-30: qty 1

## 2018-03-30 MED ORDER — HEPARIN SOD (PORK) LOCK FLUSH 100 UNIT/ML IV SOLN
500.0000 [IU] | Freq: Once | INTRAVENOUS | Status: AC | PRN
  Administered 2018-03-30: 500 [IU]
  Filled 2018-03-30: qty 5

## 2018-03-30 MED ORDER — SODIUM CHLORIDE 0.9 % IV SOLN
Freq: Once | INTRAVENOUS | Status: AC
  Administered 2018-03-30: 09:00:00 via INTRAVENOUS
  Filled 2018-03-30: qty 250

## 2018-03-30 MED ORDER — TOPOTECAN HCL CHEMO INJECTION 4 MG
1.3000 mg/m2 | Freq: Once | INTRAVENOUS | Status: AC
  Administered 2018-03-30: 2.7 mg via INTRAVENOUS
  Filled 2018-03-30: qty 2.7

## 2018-03-30 MED ORDER — MORPHINE SULFATE ER 15 MG PO TBCR: 15.0000 mg | EXTENDED_RELEASE_TABLET | Freq: Three times a day (TID) | ORAL | 0 refills | Status: DC

## 2018-04-09 DIAGNOSIS — M9903 Segmental and somatic dysfunction of lumbar region: Secondary | ICD-10-CM | POA: Diagnosis not present

## 2018-04-09 DIAGNOSIS — M50122 Cervical disc disorder at C5-C6 level with radiculopathy: Secondary | ICD-10-CM | POA: Diagnosis not present

## 2018-04-09 DIAGNOSIS — M5417 Radiculopathy, lumbosacral region: Secondary | ICD-10-CM | POA: Diagnosis not present

## 2018-04-09 DIAGNOSIS — M9901 Segmental and somatic dysfunction of cervical region: Secondary | ICD-10-CM | POA: Diagnosis not present

## 2018-04-10 DIAGNOSIS — M50122 Cervical disc disorder at C5-C6 level with radiculopathy: Secondary | ICD-10-CM | POA: Diagnosis not present

## 2018-04-10 DIAGNOSIS — M5417 Radiculopathy, lumbosacral region: Secondary | ICD-10-CM | POA: Diagnosis not present

## 2018-04-10 DIAGNOSIS — M9903 Segmental and somatic dysfunction of lumbar region: Secondary | ICD-10-CM | POA: Diagnosis not present

## 2018-04-10 DIAGNOSIS — M9901 Segmental and somatic dysfunction of cervical region: Secondary | ICD-10-CM | POA: Diagnosis not present

## 2018-04-13 ENCOUNTER — Inpatient Hospital Stay: Payer: Medicare Other

## 2018-04-13 DIAGNOSIS — T451X5A Adverse effect of antineoplastic and immunosuppressive drugs, initial encounter: Secondary | ICD-10-CM | POA: Diagnosis not present

## 2018-04-13 DIAGNOSIS — C787 Secondary malignant neoplasm of liver and intrahepatic bile duct: Secondary | ICD-10-CM | POA: Diagnosis not present

## 2018-04-13 DIAGNOSIS — C3412 Malignant neoplasm of upper lobe, left bronchus or lung: Secondary | ICD-10-CM | POA: Diagnosis not present

## 2018-04-13 DIAGNOSIS — R0981 Nasal congestion: Secondary | ICD-10-CM | POA: Diagnosis not present

## 2018-04-13 DIAGNOSIS — C7951 Secondary malignant neoplasm of bone: Secondary | ICD-10-CM | POA: Diagnosis not present

## 2018-04-13 DIAGNOSIS — C3490 Malignant neoplasm of unspecified part of unspecified bronchus or lung: Secondary | ICD-10-CM

## 2018-04-13 DIAGNOSIS — D6959 Other secondary thrombocytopenia: Secondary | ICD-10-CM | POA: Diagnosis not present

## 2018-04-13 LAB — CBC WITH DIFFERENTIAL/PLATELET
Abs Immature Granulocytes: 0.02 10*3/uL (ref 0.00–0.07)
BASOS PCT: 1 %
Basophils Absolute: 0 10*3/uL (ref 0.0–0.1)
Eosinophils Absolute: 0.2 10*3/uL (ref 0.0–0.5)
Eosinophils Relative: 5 %
HCT: 26.9 % — ABNORMAL LOW (ref 36.0–46.0)
Hemoglobin: 8.1 g/dL — ABNORMAL LOW (ref 12.0–15.0)
Immature Granulocytes: 1 %
Lymphocytes Relative: 46 %
Lymphs Abs: 1.8 10*3/uL (ref 0.7–4.0)
MCH: 29.9 pg (ref 26.0–34.0)
MCHC: 30.1 g/dL (ref 30.0–36.0)
MCV: 99.3 fL (ref 80.0–100.0)
Monocytes Absolute: 0.3 10*3/uL (ref 0.1–1.0)
Monocytes Relative: 8 %
NRBC: 0 % (ref 0.0–0.2)
Neutro Abs: 1.6 10*3/uL — ABNORMAL LOW (ref 1.7–7.7)
Neutrophils Relative %: 39 %
Platelets: 112 10*3/uL — ABNORMAL LOW (ref 150–400)
RBC: 2.71 MIL/uL — ABNORMAL LOW (ref 3.87–5.11)
RDW: 20.9 % — ABNORMAL HIGH (ref 11.5–15.5)
WBC: 4 10*3/uL (ref 4.0–10.5)

## 2018-04-13 LAB — BASIC METABOLIC PANEL
Anion gap: 6 (ref 5–15)
BUN: 10 mg/dL (ref 8–23)
CO2: 24 mmol/L (ref 22–32)
Calcium: 8.2 mg/dL — ABNORMAL LOW (ref 8.9–10.3)
Chloride: 109 mmol/L (ref 98–111)
Creatinine, Ser: 0.93 mg/dL (ref 0.44–1.00)
GFR calc Af Amer: >60 mL/min (ref >60)
Glucose, Bld: 106 mg/dL — ABNORMAL HIGH (ref 70–99)
POTASSIUM: 3.5 mmol/L (ref 3.5–5.1)
Sodium: 139 mmol/L (ref 135–145)

## 2018-04-13 LAB — SAMPLE TO BLOOD BANK

## 2018-04-14 LAB — THYROID PANEL WITH TSH
Free Thyroxine Index: 2.4 (ref 1.2–4.9)
T3 Uptake Ratio: 28 % (ref 24–39)
T4 TOTAL: 8.7 ug/dL (ref 4.5–12.0)
TSH: 6.78 u[IU]/mL — ABNORMAL HIGH (ref 0.450–4.500)

## 2018-04-17 DIAGNOSIS — M9903 Segmental and somatic dysfunction of lumbar region: Secondary | ICD-10-CM | POA: Diagnosis not present

## 2018-04-17 DIAGNOSIS — M5417 Radiculopathy, lumbosacral region: Secondary | ICD-10-CM | POA: Diagnosis not present

## 2018-04-17 DIAGNOSIS — M50122 Cervical disc disorder at C5-C6 level with radiculopathy: Secondary | ICD-10-CM | POA: Diagnosis not present

## 2018-04-17 DIAGNOSIS — M9901 Segmental and somatic dysfunction of cervical region: Secondary | ICD-10-CM | POA: Diagnosis not present

## 2018-04-21 ENCOUNTER — Other Ambulatory Visit: Payer: Self-pay | Admitting: Oncology

## 2018-04-22 NOTE — Progress Notes (Signed)
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  INTERVAL HISTORY: Patient returns to clinic today for further evaluation and consideration of cycle 5 of topotecan.  She continues to have chronic pain that is not well controlled on her current dose of MS Contin.  She continues to have chronic weakness and fatigue as well as occasional nausea. She has no neurologic complaints.  She denies any recent fevers or illnesses.  She has a good appetite and denies weight loss.  She has chronic shortness of breath, but denies any chest pain or hemoptysis. She has no abdominal pain.  She denies any melena or hematochezia.  She has no urinary complaints.  Patient offers no further specific complaints today.  REVIEW OF SYSTEMS:   Review of Systems  Constitutional: Positive for malaise/fatigue. Negative for fever and weight loss.  Respiratory: Positive for shortness of breath. Negative for cough, hemoptysis and wheezing.   Cardiovascular: Negative.  Negative for chest pain and leg swelling.  Gastrointestinal: Negative.  Negative for abdominal pain, blood in stool, diarrhea, melena, nausea and vomiting.  Genitourinary: Negative.  Negative for dysuria and frequency.  Musculoskeletal: Positive for back pain, joint pain and neck pain.  Skin: Negative.  Negative for rash.  Neurological: Positive for weakness. Negative for sensory change, focal weakness and headaches.  Psychiatric/Behavioral: Positive for memory loss. The patient is not nervous/anxious.     As per HPI. Otherwise, a complete review of systems is negative.  PAST MEDICAL HISTORY: Past Medical History:  Diagnosis Date  . Anxiety   . Arthritis   .  Asthma    COPD  . Cancer ([place])    Liver  . CAP (community acquired pneumonia) 06/30/2014  . Cardiomyopathy ([place])    nonischemic (EF 35-40%)  . CHF (congestive heart failure) ([place])   . COPD (chronic obstructive pulmonary disease) ([place])   . Depression   . Dyspnea   . GERD (gastroesophageal reflux disease)   . Hyperlipidemia   . Hypertension   . Pneumonia   . PVC's (premature ventricular contractions)    states 10 years ago  . Sepsis ([place]) 06/30/2014  . SOB (shortness of breath) 05/08/2014  . Spinal cord injury at T7-T12 level [facility])    2016    PAST SURGICAL HISTORY: Past Surgical History:  Procedure Laterality Date  . APPENDECTOMY    . CARDIAC CATHETERIZATION    . CATARACT EXTRACTION     right eye   . CESAREAN SECTION    . COLONOSCOPY    . LAMINOTOMY    . LUMBAR LAMINECTOMY/DECOMPRESSION MICRODISCECTOMY Left 08/01/2016   Procedure: Left Lumbar Four-Five Laminectomy and Foraminotomy;  Surgeon: Earnie Larsson, MD;  Location: [place];  Service: Neurosurgery;  Laterality: Left;  . PORTA CATH INSERTION N/A 02/03/2017   Procedure: PORTA CATH INSERTION;  Surgeon: Katha Cabal, MD;  Location: [place] [facility];  Service: Cardiovascular;  Laterality: N/A;    FAMILY HISTORY: Family History  Problem Relation Age of Onset  . Heart attack Father 1       MI x 3     ADVANCED DIRECTIVES (Y/N):  N  HEALTH MAINTENANCE: Social History   Tobacco Use  . Smoking status: Former Smoker    Packs/day: 0.50  Years: 50.00    Pack years: 25.00    Types: Cigarettes    Last attempt to quit: 01/27/2015    Years since quitting: 3.2  . Smokeless tobacco: Never Used  Substance Use Topics  . Alcohol use: No  . Drug use: No     Colonoscopy:  PAP:  Bone density:  Lipid panel:  Allergies  Allergen Reactions  . Fish Allergy Anaphylaxis    Per pt after eating [facility] she had throat swelling and SOB. MSY   . Fentanyl     Overly sedated and groggy, ineffective     Current  Outpatient Medications  Medication Sig Dispense Refill  . ADVAIR DISKUS 250-50 MCG/DOSE AEPB Inhale 1 puff into the lungs 2 (two) times daily.    Marland Kitchen albuterol (PROAIR HFA) 108 (90 Base) MCG/ACT inhaler Inhale 2 puffs into the lungs every 6 (six) hours as needed for wheezing or shortness of breath.     Marland Kitchen alendronate (FOSAMAX) 70 MG tablet Take 70 mg by mouth every Thursday.     . ARIPiprazole (ABILIFY) 5 MG tablet Take 5 mg by mouth daily.    . benzonatate (TESSALON) 100 MG capsule Take 1 capsule (100 mg total) by mouth every 8 (eight) hours as needed for cough. 20 capsule 0  . dextromethorphan-guaiFENesin (MUCINEX DM) 30-600 MG 12hr tablet Take 1 tablet by mouth 2 (two) times daily as needed for cough.    . diphenoxylate-atropine (LOMOTIL) 2.5-0.025 MG tablet Take 1 tablet by mouth 4 (four) times daily as needed for diarrhea or loose stools. 30 tablet 0  . DULoxetine (CYMBALTA) 60 MG capsule Take 60 mg by mouth daily.    Marland Kitchen ipratropium-albuterol (DUONEB) 0.5-2.5 (3) MG/3ML SOLN Take 3 mLs by nebulization every 4 (four) hours as needed. 360 mL 1  . LORazepam (ATIVAN) 0.5 MG tablet Take 0.5 mg by mouth as needed.    . metoprolol succinate (TOPROL-XL) 50 MG 24 hr tablet Take 1 tablet (50 mg total) by mouth daily. Take with or immediately following a meal. 180 tablet 3  . pantoprazole (PROTONIX) 40 MG tablet Take 1 tablet by mouth daily.    . potassium chloride SA (K-DUR,KLOR-CON) 20 MEQ tablet Take 1 tablet (20 mEq total) by mouth daily. 30 tablet 2  . prochlorperazine (COMPAZINE) 10 MG tablet Take 1 tablet (10 mg total) by mouth every 6 (six) hours as needed (Nausea or vomiting). 60 tablet 2  . promethazine (PHENERGAN) 25 MG tablet Take 1 tablet (25 mg total) by mouth every 8 (eight) hours as needed for nausea or vomiting. 30 tablet 2  . sacubitril-valsartan (ENTRESTO) 24-26 MG Take 1 tablet by mouth 2 (two) times daily. 180 tablet 3  . BELBUCA 150 MCG FILM Take 1 tablet by mouth daily.  0  . morphine  (MS CONTIN) 30 MG 12 hr tablet Take 1 tablet (30 mg total) by mouth every 8 (eight) hours. 90 tablet 0  . tiZANidine (ZANAFLEX) 4 MG tablet Take 1 tablet (4 mg total) by mouth every 8 (eight) hours as needed for muscle spasms. 90 tablet 2   No current facility-administered medications for this visit.    Facility-Administered Medications Ordered in Other Visits  Medication Dose Route Frequency Provider Last Rate Last Dose  . sodium chloride flush (NS) 0.9 % injection 10 mL  10 mL Intravenous PRN Lloyd Huger, MD   10 mL at 03/01/17 0841    OBJECTIVE: Vitals:   04/23/18 1013  BP: (!) 121/93  Pulse: Marland Kitchen)  115  Temp: 97.9 F (36.6 C)     Body mass index is 34.95 kg/m.    ECOG FS:2 - Symptomatic, <50% confined to bed  General: Well-developed, well-nourished, no acute distress.  Sitting in a wheelchair. Eyes: Pink conjunctiva, anicteric sclera. HEENT: Normocephalic, moist mucous membranes. Lungs: Clear to auscultation bilaterally. Heart: Regular rate and rhythm. No rubs, murmurs, or gallops. Abdomen: Soft, nontender, nondistended. No organomegaly noted, normoactive bowel sounds. Musculoskeletal: No edema, cyanosis, or clubbing. Neuro: Alert, answering all questions appropriately. Cranial nerves grossly intact. Skin: No rashes or petechiae noted. Psych: Normal affect.  LAB RESULTS:  Lab Results  Component Value Date   NA 137 04/23/2018   K 3.2 (L) 04/23/2018   CL 109 04/23/2018   CO2 21 (L) 04/23/2018   GLUCOSE 157 (H) 04/23/2018   BUN 17 04/23/2018   CREATININE 0.98 04/23/2018   CALCIUM 8.2 (L) 04/23/2018   PROT 7.6 01/08/2018   ALBUMIN 3.4 (L) 01/08/2018   AST 35 01/08/2018   ALT 34 01/08/2018   ALKPHOS 73 01/08/2018   BILITOT 0.5 01/08/2018   GFRNONAA 60 (L) 04/23/2018   GFRAA >60 04/23/2018    Lab Results  Component Value Date   WBC 8.1 04/23/2018   NEUTROABS 5.8 04/23/2018   HGB 9.9 (L) 04/23/2018   HCT 32.3 (L) 04/23/2018   MCV 98.2 04/23/2018   PLT 258  04/23/2018     STUDIES: No results found.  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  PLAN:  1. 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  Patient then received maintenance Tecentriq from Jun 21, 2017 through November 15, 2017.  CT scan results from December 25, 2017 reviewed independently with progressive disease in her mediastinum as well as significant progression of multifocal hepatic disease.  Hospice was briefly discussed, but patient wished to continue aggressive treatment.  Patient was initially scheduled to get topotecan on days 1 through 5 every 21 days, but given her persistent thrombocytopenia she can only receive treatment every 28 days.  Proceed with cycle 5, day 1 of dose reduced topotecan today.  Return to clinic Tuesday through Thursday for treatment only.  Patient does not have a ride to clinic and plans to skip her Friday treatment.  Return to clinic in 2 weeks for laboratory work only and then in 4 weeks for further evaluation and consideration of cycle 6.  2. Pain: Chronic and slightly worse.  Will increase MS Contin to 30 mg every 8 hours. Patient states she has "fired" her pain doctor.  3. Thrombocytopenia: Resolved.  Proceed with treatment every 28 days as above. 4.  Anemia: Hemoglobin has trended up slightly to 9.9.  Monitor.   5.  Neutropenia: Resolved.   6.  COPD: Chronic and unchanged.  Continue outpatient palliative care.  Continue current medications as prescribed. 7.  Hypokalemia: Mild, monitor.  Consider oral potassium supplementation in the future. 8.  Disposition: Hospice and end-of-life care were previously discussed, but patient wishes to continue aggressive treatments.  She expressed understanding that her treatment options are limited.  She confirms that she is a DNR and is having  outpatient palliative care visit.  Patient expressed understanding and was in agreement with this plan. She also understands that She can call clinic at any time with any questions, concerns, or complaints.   Cancer Staging  Small cell lung cancer [facility]) Staging form: Lung, AJCC 8th Edition - Clinical stage from 01/28/2017: Stage IV (cT4, cN3, pM1c) - Signed by Lloyd Huger, MD on 01/28/2017   Lloyd Huger, MD   04/23/2018 12:58 PM

## 2018-04-23 ENCOUNTER — Inpatient Hospital Stay: Payer: Medicare Other

## 2018-04-23 ENCOUNTER — Other Ambulatory Visit: Payer: Self-pay

## 2018-04-23 ENCOUNTER — Inpatient Hospital Stay: Payer: Medicare Other | Attending: Oncology

## 2018-04-23 ENCOUNTER — Inpatient Hospital Stay (HOSPITAL_BASED_OUTPATIENT_CLINIC_OR_DEPARTMENT_OTHER): Payer: Medicare Other | Admitting: Oncology

## 2018-04-23 VITALS — BP 121/93 | HR 115 | Temp 97.9°F | Wt 210.0 lb

## 2018-04-23 DIAGNOSIS — J449 Chronic obstructive pulmonary disease, unspecified: Secondary | ICD-10-CM | POA: Insufficient documentation

## 2018-04-23 DIAGNOSIS — R531 Weakness: Secondary | ICD-10-CM | POA: Insufficient documentation

## 2018-04-23 DIAGNOSIS — I509 Heart failure, unspecified: Secondary | ICD-10-CM | POA: Diagnosis not present

## 2018-04-23 DIAGNOSIS — J45909 Unspecified asthma, uncomplicated: Secondary | ICD-10-CM

## 2018-04-23 DIAGNOSIS — C3490 Malignant neoplasm of unspecified part of unspecified bronchus or lung: Secondary | ICD-10-CM

## 2018-04-23 DIAGNOSIS — D649 Anemia, unspecified: Secondary | ICD-10-CM | POA: Insufficient documentation

## 2018-04-23 DIAGNOSIS — M129 Arthropathy, unspecified: Secondary | ICD-10-CM

## 2018-04-23 DIAGNOSIS — Z87891 Personal history of nicotine dependence: Secondary | ICD-10-CM

## 2018-04-23 DIAGNOSIS — R110 Nausea: Secondary | ICD-10-CM

## 2018-04-23 DIAGNOSIS — Z79899 Other long term (current) drug therapy: Secondary | ICD-10-CM

## 2018-04-23 DIAGNOSIS — G893 Neoplasm related pain (acute) (chronic): Secondary | ICD-10-CM

## 2018-04-23 DIAGNOSIS — R5383 Other fatigue: Secondary | ICD-10-CM | POA: Insufficient documentation

## 2018-04-23 DIAGNOSIS — C7951 Secondary malignant neoplasm of bone: Secondary | ICD-10-CM | POA: Insufficient documentation

## 2018-04-23 DIAGNOSIS — Z5111 Encounter for antineoplastic chemotherapy: Secondary | ICD-10-CM | POA: Diagnosis not present

## 2018-04-23 DIAGNOSIS — F329 Major depressive disorder, single episode, unspecified: Secondary | ICD-10-CM | POA: Insufficient documentation

## 2018-04-23 DIAGNOSIS — K219 Gastro-esophageal reflux disease without esophagitis: Secondary | ICD-10-CM

## 2018-04-23 DIAGNOSIS — I10 Essential (primary) hypertension: Secondary | ICD-10-CM | POA: Insufficient documentation

## 2018-04-23 DIAGNOSIS — C787 Secondary malignant neoplasm of liver and intrahepatic bile duct: Secondary | ICD-10-CM | POA: Diagnosis not present

## 2018-04-23 DIAGNOSIS — E785 Hyperlipidemia, unspecified: Secondary | ICD-10-CM

## 2018-04-23 DIAGNOSIS — E876 Hypokalemia: Secondary | ICD-10-CM

## 2018-04-23 LAB — CBC WITH DIFFERENTIAL/PLATELET
Abs Immature Granulocytes: 0.04 10*3/uL (ref 0.00–0.07)
Basophils Absolute: 0 10*3/uL (ref 0.0–0.1)
Basophils Relative: 1 %
Eosinophils Absolute: 0.2 10*3/uL (ref 0.0–0.5)
Eosinophils Relative: 2 %
HCT: 32.3 % — ABNORMAL LOW (ref 36.0–46.0)
Hemoglobin: 9.9 g/dL — ABNORMAL LOW (ref 12.0–15.0)
Immature Granulocytes: 1 %
LYMPHS PCT: 18 %
Lymphs Abs: 1.5 10*3/uL (ref 0.7–4.0)
MCH: 30.1 pg (ref 26.0–34.0)
MCHC: 30.7 g/dL (ref 30.0–36.0)
MCV: 98.2 fL (ref 80.0–100.0)
Monocytes Absolute: 0.6 10*3/uL (ref 0.1–1.0)
Monocytes Relative: 7 %
Neutro Abs: 5.8 10*3/uL (ref 1.7–7.7)
Neutrophils Relative %: 71 %
PLATELETS: 258 10*3/uL (ref 150–400)
RBC: 3.29 MIL/uL — ABNORMAL LOW (ref 3.87–5.11)
RDW: 19.5 % — ABNORMAL HIGH (ref 11.5–15.5)
WBC: 8.1 10*3/uL (ref 4.0–10.5)
nRBC: 0 % (ref 0.0–0.2)

## 2018-04-23 LAB — SAMPLE TO BLOOD BANK

## 2018-04-23 LAB — BASIC METABOLIC PANEL
Anion gap: 7 (ref 5–15)
BUN: 17 mg/dL (ref 8–23)
CHLORIDE: 109 mmol/L (ref 98–111)
CO2: 21 mmol/L — ABNORMAL LOW (ref 22–32)
Calcium: 8.2 mg/dL — ABNORMAL LOW (ref 8.9–10.3)
Creatinine, Ser: 0.98 mg/dL (ref 0.44–1.00)
GFR calc Af Amer: >60 mL/min (ref >60)
GFR calc non Af Amer: 60 mL/min — ABNORMAL LOW (ref >60)
Glucose, Bld: 157 mg/dL — ABNORMAL HIGH (ref 70–99)
Potassium: 3.2 mmol/L — ABNORMAL LOW (ref 3.5–5.1)
Sodium: 137 mmol/L (ref 135–145)

## 2018-04-23 MED ORDER — TOPOTECAN HCL CHEMO INJECTION 4 MG
1.3000 mg/m2 | Freq: Once | INTRAVENOUS | Status: AC
  Administered 2018-04-23: 2.7 mg via INTRAVENOUS
  Filled 2018-04-23: qty 2.7

## 2018-04-23 MED ORDER — PROCHLORPERAZINE MALEATE 10 MG PO TABS
10.0000 mg | ORAL_TABLET | Freq: Once | ORAL | Status: AC
  Administered 2018-04-23: 10 mg via ORAL
  Filled 2018-04-23: qty 1

## 2018-04-23 MED ORDER — SODIUM CHLORIDE 0.9 % IV SOLN
Freq: Once | INTRAVENOUS | Status: AC
  Administered 2018-04-23: 11:00:00 via INTRAVENOUS
  Filled 2018-04-23: qty 250

## 2018-04-23 MED ORDER — MORPHINE SULFATE ER 30 MG PO TBCR: 30.0000 mg | EXTENDED_RELEASE_TABLET | Freq: Three times a day (TID) | ORAL | 0 refills | Status: DC

## 2018-04-23 MED ORDER — HEPARIN SOD (PORK) LOCK FLUSH 100 UNIT/ML IV SOLN
500.0000 [IU] | Freq: Once | INTRAVENOUS | Status: AC
  Administered 2018-04-23: 500 [IU] via INTRAVENOUS
  Filled 2018-04-23: qty 5

## 2018-04-23 MED ORDER — SODIUM CHLORIDE 0.9% FLUSH
10.0000 mL | Freq: Once | INTRAVENOUS | Status: AC
  Administered 2018-04-23: 10 mL via INTRAVENOUS
  Filled 2018-04-23: qty 10

## 2018-04-23 MED ORDER — HEPARIN SOD (PORK) LOCK FLUSH 100 UNIT/ML IV SOLN
INTRAVENOUS | Status: AC
  Filled 2018-04-23: qty 5

## 2018-04-24 ENCOUNTER — Inpatient Hospital Stay: Payer: Medicare Other

## 2018-04-24 VITALS — BP 106/71 | HR 92 | Temp 97.0°F

## 2018-04-24 DIAGNOSIS — Z5111 Encounter for antineoplastic chemotherapy: Secondary | ICD-10-CM | POA: Diagnosis not present

## 2018-04-24 DIAGNOSIS — G893 Neoplasm related pain (acute) (chronic): Secondary | ICD-10-CM | POA: Diagnosis not present

## 2018-04-24 DIAGNOSIS — D649 Anemia, unspecified: Secondary | ICD-10-CM | POA: Diagnosis not present

## 2018-04-24 DIAGNOSIS — C3490 Malignant neoplasm of unspecified part of unspecified bronchus or lung: Secondary | ICD-10-CM

## 2018-04-24 DIAGNOSIS — C7951 Secondary malignant neoplasm of bone: Secondary | ICD-10-CM | POA: Diagnosis not present

## 2018-04-24 DIAGNOSIS — C787 Secondary malignant neoplasm of liver and intrahepatic bile duct: Secondary | ICD-10-CM | POA: Diagnosis not present

## 2018-04-24 LAB — THYROID PANEL WITH TSH
Free Thyroxine Index: 1.9 (ref 1.2–4.9)
T3 Uptake Ratio: 24 % (ref 24–39)
T4, Total: 7.9 ug/dL (ref 4.5–12.0)
TSH: 4.04 u[IU]/mL (ref 0.450–4.500)

## 2018-04-24 MED ORDER — HEPARIN SOD (PORK) LOCK FLUSH 100 UNIT/ML IV SOLN
500.0000 [IU] | Freq: Once | INTRAVENOUS | Status: AC | PRN
  Administered 2018-04-24: 500 [IU]
  Filled 2018-04-24: qty 5

## 2018-04-24 MED ORDER — SODIUM CHLORIDE 0.9 % IV SOLN
Freq: Once | INTRAVENOUS | Status: AC
  Administered 2018-04-24: 11:00:00 via INTRAVENOUS
  Filled 2018-04-24: qty 250

## 2018-04-24 MED ORDER — PROCHLORPERAZINE MALEATE 10 MG PO TABS
10.0000 mg | ORAL_TABLET | Freq: Once | ORAL | Status: AC
  Administered 2018-04-24: 10 mg via ORAL
  Filled 2018-04-24: qty 1

## 2018-04-24 MED ORDER — TOPOTECAN HCL CHEMO INJECTION 4 MG
1.3000 mg/m2 | Freq: Once | INTRAVENOUS | Status: AC
  Administered 2018-04-24: 2.7 mg via INTRAVENOUS
  Filled 2018-04-24: qty 2.7

## 2018-04-25 ENCOUNTER — Other Ambulatory Visit: Payer: Self-pay

## 2018-04-25 ENCOUNTER — Inpatient Hospital Stay: Payer: Medicare Other

## 2018-04-25 VITALS — BP 110/72 | HR 85 | Temp 97.2°F | Resp 18

## 2018-04-25 DIAGNOSIS — C3490 Malignant neoplasm of unspecified part of unspecified bronchus or lung: Secondary | ICD-10-CM | POA: Diagnosis not present

## 2018-04-25 DIAGNOSIS — C7951 Secondary malignant neoplasm of bone: Secondary | ICD-10-CM | POA: Diagnosis not present

## 2018-04-25 DIAGNOSIS — D649 Anemia, unspecified: Secondary | ICD-10-CM | POA: Diagnosis not present

## 2018-04-25 DIAGNOSIS — Z5111 Encounter for antineoplastic chemotherapy: Secondary | ICD-10-CM | POA: Diagnosis not present

## 2018-04-25 DIAGNOSIS — G893 Neoplasm related pain (acute) (chronic): Secondary | ICD-10-CM | POA: Diagnosis not present

## 2018-04-25 DIAGNOSIS — C787 Secondary malignant neoplasm of liver and intrahepatic bile duct: Secondary | ICD-10-CM | POA: Diagnosis not present

## 2018-04-25 MED ORDER — PROCHLORPERAZINE MALEATE 10 MG PO TABS
10.0000 mg | ORAL_TABLET | Freq: Once | ORAL | Status: AC
  Administered 2018-04-25: 10 mg via ORAL
  Filled 2018-04-25: qty 1

## 2018-04-25 MED ORDER — SODIUM CHLORIDE 0.9 % IV SOLN
Freq: Once | INTRAVENOUS | Status: AC
  Administered 2018-04-25: 11:00:00 via INTRAVENOUS
  Filled 2018-04-25: qty 250

## 2018-04-25 MED ORDER — TOPOTECAN HCL CHEMO INJECTION 4 MG
1.3000 mg/m2 | Freq: Once | INTRAVENOUS | Status: AC
  Administered 2018-04-25: 2.7 mg via INTRAVENOUS
  Filled 2018-04-25: qty 2.7

## 2018-04-25 MED ORDER — HEPARIN SOD (PORK) LOCK FLUSH 100 UNIT/ML IV SOLN
500.0000 [IU] | Freq: Once | INTRAVENOUS | Status: AC
  Administered 2018-04-25: 500 [IU] via INTRAVENOUS

## 2018-04-26 ENCOUNTER — Inpatient Hospital Stay: Payer: Medicare Other

## 2018-04-26 VITALS — BP 119/78 | HR 99 | Temp 96.0°F | Resp 20

## 2018-04-26 DIAGNOSIS — C3490 Malignant neoplasm of unspecified part of unspecified bronchus or lung: Secondary | ICD-10-CM

## 2018-04-26 DIAGNOSIS — Z5111 Encounter for antineoplastic chemotherapy: Secondary | ICD-10-CM | POA: Diagnosis not present

## 2018-04-26 DIAGNOSIS — G893 Neoplasm related pain (acute) (chronic): Secondary | ICD-10-CM | POA: Diagnosis not present

## 2018-04-26 DIAGNOSIS — C7951 Secondary malignant neoplasm of bone: Secondary | ICD-10-CM | POA: Diagnosis not present

## 2018-04-26 DIAGNOSIS — C787 Secondary malignant neoplasm of liver and intrahepatic bile duct: Secondary | ICD-10-CM | POA: Diagnosis not present

## 2018-04-26 DIAGNOSIS — D649 Anemia, unspecified: Secondary | ICD-10-CM | POA: Diagnosis not present

## 2018-04-26 MED ORDER — PROCHLORPERAZINE MALEATE 10 MG PO TABS
10.0000 mg | ORAL_TABLET | Freq: Once | ORAL | Status: AC
  Administered 2018-04-26: 10 mg via ORAL

## 2018-04-26 MED ORDER — TOPOTECAN HCL CHEMO INJECTION 4 MG
1.3000 mg/m2 | Freq: Once | INTRAVENOUS | Status: AC
  Administered 2018-04-26: 2.7 mg via INTRAVENOUS
  Filled 2018-04-26: qty 2.7

## 2018-04-26 MED ORDER — HEPARIN SOD (PORK) LOCK FLUSH 100 UNIT/ML IV SOLN
500.0000 [IU] | Freq: Once | INTRAVENOUS | Status: AC | PRN
  Administered 2018-04-26: 500 [IU]
  Filled 2018-04-26: qty 5

## 2018-04-26 MED ORDER — SODIUM CHLORIDE 0.9 % IV SOLN
Freq: Once | INTRAVENOUS | Status: AC
  Administered 2018-04-26: 11:00:00 via INTRAVENOUS
  Filled 2018-04-26: qty 250

## 2018-04-26 MED ORDER — SODIUM CHLORIDE 0.9% FLUSH
10.0000 mL | INTRAVENOUS | Status: DC | PRN
  Administered 2018-04-26: 10 mL
  Filled 2018-04-26: qty 10

## 2018-04-27 ENCOUNTER — Other Ambulatory Visit: Payer: Medicare Other

## 2018-04-27 ENCOUNTER — Ambulatory Visit: Payer: Medicare Other | Admitting: Oncology

## 2018-04-27 ENCOUNTER — Ambulatory Visit: Payer: Medicare Other

## 2018-05-04 ENCOUNTER — Other Ambulatory Visit: Payer: Self-pay

## 2018-05-07 ENCOUNTER — Inpatient Hospital Stay: Payer: Medicare Other

## 2018-05-07 ENCOUNTER — Other Ambulatory Visit: Payer: Self-pay

## 2018-05-07 DIAGNOSIS — G893 Neoplasm related pain (acute) (chronic): Secondary | ICD-10-CM | POA: Diagnosis not present

## 2018-05-07 DIAGNOSIS — C787 Secondary malignant neoplasm of liver and intrahepatic bile duct: Secondary | ICD-10-CM | POA: Diagnosis not present

## 2018-05-07 DIAGNOSIS — C3490 Malignant neoplasm of unspecified part of unspecified bronchus or lung: Secondary | ICD-10-CM

## 2018-05-07 DIAGNOSIS — D649 Anemia, unspecified: Secondary | ICD-10-CM | POA: Diagnosis not present

## 2018-05-07 DIAGNOSIS — Z5111 Encounter for antineoplastic chemotherapy: Secondary | ICD-10-CM | POA: Diagnosis not present

## 2018-05-07 DIAGNOSIS — C7951 Secondary malignant neoplasm of bone: Secondary | ICD-10-CM | POA: Diagnosis not present

## 2018-05-07 LAB — CBC WITH DIFFERENTIAL/PLATELET
Abs Immature Granulocytes: 0 10*3/uL (ref 0.00–0.07)
Basophils Absolute: 0 10*3/uL (ref 0.0–0.1)
Basophils Relative: 0 %
Eosinophils Absolute: 0.1 10*3/uL (ref 0.0–0.5)
Eosinophils Relative: 3 %
HCT: 28.1 % — ABNORMAL LOW (ref 36.0–46.0)
Hemoglobin: 8.8 g/dL — ABNORMAL LOW (ref 12.0–15.0)
Immature Granulocytes: 0 %
Lymphocytes Relative: 54 %
Lymphs Abs: 1.2 10*3/uL (ref 0.7–4.0)
MCH: 29.8 pg (ref 26.0–34.0)
MCHC: 31.3 g/dL (ref 30.0–36.0)
MCV: 95.3 fL (ref 80.0–100.0)
Monocytes Absolute: 0.1 10*3/uL (ref 0.1–1.0)
Monocytes Relative: 6 %
NEUTROS PCT: 37 %
Neutro Abs: 0.8 10*3/uL — ABNORMAL LOW (ref 1.7–7.7)
Platelets: 26 10*3/uL — CL (ref 150–400)
RBC: 2.95 MIL/uL — AB (ref 3.87–5.11)
RDW: 16 % — ABNORMAL HIGH (ref 11.5–15.5)
Smear Review: NORMAL
WBC: 2.2 10*3/uL — ABNORMAL LOW (ref 4.0–10.5)
nRBC: 0 % (ref 0.0–0.2)

## 2018-05-07 LAB — COMPREHENSIVE METABOLIC PANEL
ALT: 30 U/L (ref 0–44)
ANION GAP: 8 (ref 5–15)
AST: 30 U/L (ref 15–41)
Albumin: 3.5 g/dL (ref 3.5–5.0)
Alkaline Phosphatase: 88 U/L (ref 38–126)
BUN: 10 mg/dL (ref 8–23)
CO2: 24 mmol/L (ref 22–32)
Calcium: 8.4 mg/dL — ABNORMAL LOW (ref 8.9–10.3)
Chloride: 105 mmol/L (ref 98–111)
Creatinine, Ser: 0.83 mg/dL (ref 0.44–1.00)
GFR calc Af Amer: >60 mL/min (ref >60)
GFR calc non Af Amer: >60 mL/min (ref >60)
Glucose, Bld: 131 mg/dL — ABNORMAL HIGH (ref 70–99)
Potassium: 3.7 mmol/L (ref 3.5–5.1)
Sodium: 137 mmol/L (ref 135–145)
Total Bilirubin: 0.4 mg/dL (ref 0.3–1.2)
Total Protein: 8.2 g/dL — ABNORMAL HIGH (ref 6.5–8.1)

## 2018-05-07 LAB — SAMPLE TO BLOOD BANK

## 2018-05-14 ENCOUNTER — Inpatient Hospital Stay: Payer: Medicare Other

## 2018-05-15 ENCOUNTER — Inpatient Hospital Stay: Payer: Medicare Other

## 2018-05-16 ENCOUNTER — Inpatient Hospital Stay: Payer: Medicare Other

## 2018-05-17 ENCOUNTER — Inpatient Hospital Stay: Payer: Medicare Other

## 2018-05-18 ENCOUNTER — Inpatient Hospital Stay: Payer: Medicare Other

## 2018-05-20 NOTE — Progress Notes (Signed)
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  INTERVAL HISTORY: Patient returns to clinic today for further evaluation and consideration of cycle 6 of Toprol T can.  She continues to have chronic pain, but this is well controlled on her current dose of MS Contin.  She has chronic weakness and fatigue as well as occasional nausea.  She has no neurologic complaints.  She denies any recent fevers or illnesses.  She has a good appetite and denies weight loss.  She has chronic shortness of breath, but denies any chest pain or hemoptysis. She has no abdominal pain.  She denies any melena or hematochezia.  She has no urinary complaints.  Patient offers no further specific complaints today.  REVIEW OF SYSTEMS:   Review of Systems  Constitutional: Positive for malaise/fatigue. Negative for fever and weight loss.  Respiratory: Positive for shortness of breath. Negative for cough, hemoptysis and wheezing.   Cardiovascular: Negative.  Negative for chest pain and leg swelling.  Gastrointestinal: Positive for nausea. Negative for abdominal pain, blood in stool, diarrhea, melena and vomiting.  Genitourinary: Negative.  Negative for dysuria and frequency.  Musculoskeletal: Positive for back pain, joint pain and neck pain.  Skin: Negative.  Negative for rash.  Neurological: Positive for weakness. Negative for sensory change, focal weakness and headaches.  Psychiatric/Behavioral: Negative.  Negative for memory loss. The patient is not nervous/anxious.     As per HPI. Otherwise, a complete review of systems is negative.  PAST MEDICAL HISTORY: Past Medical History:  Diagnosis Date  . Anxiety   . Arthritis   .  Asthma    COPD  . Cancer ([place])    Liver  . CAP (community acquired pneumonia) 06/30/2014  . Cardiomyopathy ([place])    nonischemic (EF 35-40%)  . CHF (congestive heart failure) ([place])   . COPD (chronic obstructive pulmonary disease) ([place])   . Depression   . Dyspnea   . GERD (gastroesophageal reflux disease)   . Hyperlipidemia   . Hypertension   . Pneumonia   . PVC's (premature ventricular contractions)    states 10 years ago  . Sepsis ([place]) 06/30/2014  . SOB (shortness of breath) 05/08/2014  . Spinal cord injury at T7-T12 level [facility])    2016    PAST SURGICAL HISTORY: Past Surgical History:  Procedure Laterality Date  . APPENDECTOMY    . CARDIAC CATHETERIZATION    . CATARACT EXTRACTION     right eye   . CESAREAN SECTION    . COLONOSCOPY    . LAMINOTOMY    . LUMBAR LAMINECTOMY/DECOMPRESSION MICRODISCECTOMY Left 08/01/2016   Procedure: Left Lumbar Four-Five Laminectomy and Foraminotomy;  Surgeon: Earnie Larsson, MD;  Location: [place];  Service: Neurosurgery;  Laterality: Left;  . PORTA CATH INSERTION N/A 02/03/2017   Procedure: PORTA CATH INSERTION;  Surgeon: Katha Cabal, MD;  Location: [place] [facility] LAB;  Service: Cardiovascular;  Laterality: N/A;    FAMILY HISTORY: Family History  Problem Relation Age of Onset  . Heart attack Father 29       MI x 3     ADVANCED DIRECTIVES (Y/N):  N  HEALTH MAINTENANCE: Social History   Tobacco Use  . Smoking status: Former Smoker  Packs/day: 0.50    Years: 50.00    Pack years: 25.00    Types: Cigarettes    Last attempt to quit: 01/27/2015    Years since quitting: 3.3  . Smokeless tobacco: Never Used  Substance Use Topics  . Alcohol use: No  . Drug use: No     Colonoscopy:  PAP:  Bone density:  Lipid panel:  Allergies  Allergen Reactions  . Fish Allergy Anaphylaxis    Per pt after eating [facility] she had throat swelling and SOB. MSY   . Fentanyl     Overly sedated and groggy, ineffective     Current  Outpatient Medications  Medication Sig Dispense Refill  . ADVAIR DISKUS 250-50 MCG/DOSE AEPB Inhale 1 puff into the lungs 2 (two) times daily.    Marland Kitchen albuterol (PROAIR HFA) 108 (90 Base) MCG/ACT inhaler Inhale 2 puffs into the lungs every 6 (six) hours as needed for wheezing or shortness of breath.     Marland Kitchen alendronate (FOSAMAX) 70 MG tablet Take 70 mg by mouth every Thursday.     . ARIPiprazole (ABILIFY) 5 MG tablet Take 5 mg by mouth daily.    Marland Kitchen dextromethorphan-guaiFENesin (MUCINEX DM) 30-600 MG 12hr tablet Take 1 tablet by mouth 2 (two) times daily as needed for cough.    . diphenoxylate-atropine (LOMOTIL) 2.5-0.025 MG tablet Take 1 tablet by mouth 4 (four) times daily as needed for diarrhea or loose stools. 30 tablet 0  . DULoxetine (CYMBALTA) 60 MG capsule Take 60 mg by mouth daily.    Marland Kitchen ipratropium-albuterol (DUONEB) 0.5-2.5 (3) MG/3ML SOLN Take 3 mLs by nebulization every 4 (four) hours as needed. 360 mL 1  . LORazepam (ATIVAN) 0.5 MG tablet Take 0.5 mg by mouth as needed.    . metoprolol succinate (TOPROL-XL) 100 MG 24 hr tablet Take 1 tablet by mouth 1 day or 1 dose.    . pantoprazole (PROTONIX) 40 MG tablet Take 1 tablet by mouth daily.    . potassium chloride SA (K-DUR,KLOR-CON) 20 MEQ tablet Take 1 tablet (20 mEq total) by mouth daily. 30 tablet 2  . prochlorperazine (COMPAZINE) 10 MG tablet Take 1 tablet (10 mg total) by mouth every 6 (six) hours as needed (Nausea or vomiting). 60 tablet 2  . promethazine (PHENERGAN) 25 MG tablet Take 1 tablet (25 mg total) by mouth every 8 (eight) hours as needed for nausea or vomiting. 30 tablet 2  . sacubitril-valsartan (ENTRESTO) 24-26 MG Take 1 tablet by mouth 2 (two) times daily. 180 tablet 3  . BELBUCA 150 MCG FILM Take 1 tablet by mouth daily.  0  . benzonatate (TESSALON) 100 MG capsule Take 1 capsule (100 mg total) by mouth every 8 (eight) hours as needed for cough. (Patient not taking: Reported on 05/21/2018) 20 capsule 0  . morphine (MS CONTIN)  30 MG 12 hr tablet Take 1 tablet (30 mg total) by mouth every 8 (eight) hours. 90 tablet 0  . tiZANidine (ZANAFLEX) 4 MG tablet Take 1 tablet (4 mg total) by mouth every 8 (eight) hours as needed for muscle spasms. (Patient not taking: Reported on 05/21/2018) 90 tablet 2   No current facility-administered medications for this visit.    Facility-Administered Medications Ordered in Other Visits  Medication Dose Route Frequency Provider Last Rate Last Dose  . sodium chloride flush (NS) 0.9 % injection 10 mL  10 mL Intravenous PRN Lloyd Huger, MD   10 mL at 03/01/17 0841    OBJECTIVE: Vitals:  05/21/18 1029  BP: 90/61  Pulse: 80  Temp: 97.8 F (36.6 C)     Body mass index is 34.95 kg/m.    ECOG FS:2 - Symptomatic, <50% confined to bed  General: Well-developed, well-nourished, no acute distress.  Sitting in a wheelchair. Eyes: Pink conjunctiva, anicteric sclera. HEENT: Normocephalic, moist mucous membranes. Lungs: Clear to auscultation bilaterally. Heart: Regular rate and rhythm. No rubs, murmurs, or gallops. Abdomen: Soft, nontender, nondistended. No organomegaly noted, normoactive bowel sounds. Musculoskeletal: No edema, cyanosis, or clubbing. Neuro: Alert, answering all questions appropriately. Cranial nerves grossly intact. Skin: No rashes or petechiae noted. Psych: Normal affect.  LAB RESULTS:  Lab Results  Component Value Date   NA 135 05/21/2018   K 3.6 05/21/2018   CL 106 05/21/2018   CO2 22 05/21/2018   GLUCOSE 136 (H) 05/21/2018   BUN 19 05/21/2018   CREATININE 0.90 05/21/2018   CALCIUM 8.1 (L) 05/21/2018   PROT 7.8 05/21/2018   ALBUMIN 3.5 05/21/2018   AST 34 05/21/2018   ALT 33 05/21/2018   ALKPHOS 91 05/21/2018   BILITOT 0.4 05/21/2018   GFRNONAA >60 05/21/2018   GFRAA >60 05/21/2018    Lab Results  Component Value Date   WBC 7.1 05/21/2018   NEUTROABS 4.8 05/21/2018   HGB 9.8 (L) 05/21/2018   HCT 31.6 (L) 05/21/2018   MCV 97.5 05/21/2018    PLT 253 05/21/2018     STUDIES: No results found.  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  PLAN:  1. 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  Patient then received maintenance Tecentriq from Jun 21, 2017 through November 15, 2017.  CT scan results from December 25, 2017 reviewed independently with progressive disease in her mediastinum as well as significant progression of multifocal hepatic disease.  Hospice was briefly discussed, but patient wished to continue aggressive treatment.  Patient was initially scheduled to get topotecan on days 1 through 5 every 21 days, but given her persistent thrombocytopenia she can only receive treatment every 28 days.  Her most recent imaging on March 22, 2018 revealed a significant improvement of disease burden.  Proceed with cycle 6, day 1 of dose reduced Toprol taken today.  Return to clinic Tuesday through Friday for treatment only.  Patient will then return to clinic in 1 month for repeat laboratory work, further evaluation, and consideration of cycle 7.  Will reimage prior to next treatment.   2. Pain: Continue MS Contin to 30 mg every 8 hours. Patient states she has "fired" her pain doctor.  3. Thrombocytopenia: Resolved.  Proceed with treatment every 28 days as above. 4.  Anemia: Patient's hemoglobin is chronically decreased, but relatively unchanged at 9.8. 5.  Neutropenia: Resolved.   6.  COPD: Chronic and unchanged.  Continue outpatient palliative care.  Continue current medications as prescribed. 7.  Hypokalemia: Resolved.  Consider oral potassium supplementation in the future. 8.  Disposition: Hospice and end-of-life care were previously discussed, but patient wishes to continue aggressive treatments.  She expressed understanding that her treatment options are  limited.  She confirms that she is a DNR and is having outpatient palliative care visit.  Patient expressed understanding and was in agreement with this plan. She also understands that She can call clinic at any time with any questions, concerns, or complaints.   Cancer  Staging Small cell lung cancer [facility]) Staging form: Lung, AJCC 8th Edition - Clinical stage from 01/28/2017: Stage IV (cT4, cN3, pM1c) - Signed by Lloyd Huger, MD on 01/28/2017   Lloyd Huger, MD   05/21/2018 4:52 PM

## 2018-05-21 ENCOUNTER — Other Ambulatory Visit: Payer: Self-pay

## 2018-05-21 ENCOUNTER — Inpatient Hospital Stay (HOSPITAL_BASED_OUTPATIENT_CLINIC_OR_DEPARTMENT_OTHER): Payer: Medicare Other | Admitting: Oncology

## 2018-05-21 ENCOUNTER — Encounter: Payer: Self-pay | Admitting: Oncology

## 2018-05-21 ENCOUNTER — Other Ambulatory Visit: Payer: Self-pay | Admitting: *Deleted

## 2018-05-21 ENCOUNTER — Inpatient Hospital Stay: Payer: Medicare Other

## 2018-05-21 ENCOUNTER — Inpatient Hospital Stay: Payer: Medicare Other | Attending: Oncology

## 2018-05-21 VITALS — BP 90/61 | HR 80 | Temp 97.8°F | Ht 65.0 in

## 2018-05-21 DIAGNOSIS — M129 Arthropathy, unspecified: Secondary | ICD-10-CM

## 2018-05-21 DIAGNOSIS — G8929 Other chronic pain: Secondary | ICD-10-CM | POA: Diagnosis not present

## 2018-05-21 DIAGNOSIS — F329 Major depressive disorder, single episode, unspecified: Secondary | ICD-10-CM | POA: Diagnosis not present

## 2018-05-21 DIAGNOSIS — K219 Gastro-esophageal reflux disease without esophagitis: Secondary | ICD-10-CM

## 2018-05-21 DIAGNOSIS — C7951 Secondary malignant neoplasm of bone: Secondary | ICD-10-CM | POA: Insufficient documentation

## 2018-05-21 DIAGNOSIS — Z5111 Encounter for antineoplastic chemotherapy: Secondary | ICD-10-CM | POA: Insufficient documentation

## 2018-05-21 DIAGNOSIS — C787 Secondary malignant neoplasm of liver and intrahepatic bile duct: Secondary | ICD-10-CM | POA: Diagnosis not present

## 2018-05-21 DIAGNOSIS — J449 Chronic obstructive pulmonary disease, unspecified: Secondary | ICD-10-CM | POA: Diagnosis not present

## 2018-05-21 DIAGNOSIS — I10 Essential (primary) hypertension: Secondary | ICD-10-CM | POA: Insufficient documentation

## 2018-05-21 DIAGNOSIS — Z95828 Presence of other vascular implants and grafts: Secondary | ICD-10-CM

## 2018-05-21 DIAGNOSIS — Z87891 Personal history of nicotine dependence: Secondary | ICD-10-CM | POA: Diagnosis not present

## 2018-05-21 DIAGNOSIS — R110 Nausea: Secondary | ICD-10-CM | POA: Insufficient documentation

## 2018-05-21 DIAGNOSIS — C3490 Malignant neoplasm of unspecified part of unspecified bronchus or lung: Secondary | ICD-10-CM | POA: Insufficient documentation

## 2018-05-21 DIAGNOSIS — Z79899 Other long term (current) drug therapy: Secondary | ICD-10-CM

## 2018-05-21 DIAGNOSIS — E785 Hyperlipidemia, unspecified: Secondary | ICD-10-CM | POA: Diagnosis not present

## 2018-05-21 DIAGNOSIS — R531 Weakness: Secondary | ICD-10-CM | POA: Insufficient documentation

## 2018-05-21 DIAGNOSIS — R5383 Other fatigue: Secondary | ICD-10-CM | POA: Insufficient documentation

## 2018-05-21 DIAGNOSIS — I509 Heart failure, unspecified: Secondary | ICD-10-CM | POA: Diagnosis not present

## 2018-05-21 DIAGNOSIS — D649 Anemia, unspecified: Secondary | ICD-10-CM

## 2018-05-21 LAB — CBC WITH DIFFERENTIAL/PLATELET
Abs Immature Granulocytes: 0.05 10*3/uL (ref 0.00–0.07)
Basophils Absolute: 0 10*3/uL (ref 0.0–0.1)
Basophils Relative: 0 %
Eosinophils Absolute: 0 10*3/uL (ref 0.0–0.5)
Eosinophils Relative: 1 %
HCT: 31.6 % — ABNORMAL LOW (ref 36.0–46.0)
Hemoglobin: 9.8 g/dL — ABNORMAL LOW (ref 12.0–15.0)
Immature Granulocytes: 1 %
Lymphocytes Relative: 21 %
Lymphs Abs: 1.5 10*3/uL (ref 0.7–4.0)
MCH: 30.2 pg (ref 26.0–34.0)
MCHC: 31 g/dL (ref 30.0–36.0)
MCV: 97.5 fL (ref 80.0–100.0)
Monocytes Absolute: 0.6 10*3/uL (ref 0.1–1.0)
Monocytes Relative: 9 %
Neutro Abs: 4.8 10*3/uL (ref 1.7–7.7)
Neutrophils Relative %: 68 %
Platelets: 253 10*3/uL (ref 150–400)
RBC: 3.24 MIL/uL — ABNORMAL LOW (ref 3.87–5.11)
RDW: 17.8 % — ABNORMAL HIGH (ref 11.5–15.5)
WBC: 7.1 10*3/uL (ref 4.0–10.5)
nRBC: 0 % (ref 0.0–0.2)

## 2018-05-21 LAB — COMPREHENSIVE METABOLIC PANEL
ALT: 33 U/L (ref 0–44)
AST: 34 U/L (ref 15–41)
Albumin: 3.5 g/dL (ref 3.5–5.0)
Alkaline Phosphatase: 91 U/L (ref 38–126)
Anion gap: 7 (ref 5–15)
BUN: 19 mg/dL (ref 8–23)
CO2: 22 mmol/L (ref 22–32)
Calcium: 8.1 mg/dL — ABNORMAL LOW (ref 8.9–10.3)
Chloride: 106 mmol/L (ref 98–111)
Creatinine, Ser: 0.9 mg/dL (ref 0.44–1.00)
GFR calc Af Amer: >60 mL/min (ref >60)
GFR calc non Af Amer: >60 mL/min (ref >60)
Glucose, Bld: 136 mg/dL — ABNORMAL HIGH (ref 70–99)
Potassium: 3.6 mmol/L (ref 3.5–5.1)
Sodium: 135 mmol/L (ref 135–145)
Total Bilirubin: 0.4 mg/dL (ref 0.3–1.2)
Total Protein: 7.8 g/dL (ref 6.5–8.1)

## 2018-05-21 LAB — SAMPLE TO BLOOD BANK

## 2018-05-21 MED ORDER — TOPOTECAN HCL CHEMO INJECTION 4 MG
1.3000 mg/m2 | Freq: Once | INTRAVENOUS | Status: AC
  Administered 2018-05-21: 2.7 mg via INTRAVENOUS
  Filled 2018-05-21: qty 2.7

## 2018-05-21 MED ORDER — HEPARIN SOD (PORK) LOCK FLUSH 100 UNIT/ML IV SOLN
500.0000 [IU] | Freq: Once | INTRAVENOUS | Status: AC | PRN
  Administered 2018-05-21: 13:00:00 500 [IU]
  Filled 2018-05-21: qty 5

## 2018-05-21 MED ORDER — SODIUM CHLORIDE 0.9 % IV SOLN
Freq: Once | INTRAVENOUS | Status: AC
  Administered 2018-05-21: 11:00:00 via INTRAVENOUS
  Filled 2018-05-21: qty 250

## 2018-05-21 MED ORDER — SODIUM CHLORIDE 0.9% FLUSH
10.0000 mL | Freq: Once | INTRAVENOUS | Status: AC
  Administered 2018-05-21: 10 mL via INTRAVENOUS
  Filled 2018-05-21: qty 10

## 2018-05-21 MED ORDER — MORPHINE SULFATE ER 30 MG PO TBCR: 30.0000 mg | EXTENDED_RELEASE_TABLET | Freq: Three times a day (TID) | ORAL | 0 refills | Status: DC

## 2018-05-21 MED ORDER — PROCHLORPERAZINE MALEATE 10 MG PO TABS
10.0000 mg | ORAL_TABLET | Freq: Once | ORAL | Status: AC
  Administered 2018-05-21: 10 mg via ORAL
  Filled 2018-05-21: qty 1

## 2018-05-21 NOTE — Progress Notes (Signed)
Patient stated that she had been doing well with no complaints. 

## 2018-05-22 ENCOUNTER — Other Ambulatory Visit: Payer: Self-pay

## 2018-05-22 ENCOUNTER — Inpatient Hospital Stay: Payer: Medicare Other

## 2018-05-22 VITALS — BP 106/68 | HR 81 | Temp 98.0°F | Resp 21

## 2018-05-22 DIAGNOSIS — Z5111 Encounter for antineoplastic chemotherapy: Secondary | ICD-10-CM | POA: Diagnosis not present

## 2018-05-22 DIAGNOSIS — J449 Chronic obstructive pulmonary disease, unspecified: Secondary | ICD-10-CM | POA: Diagnosis not present

## 2018-05-22 DIAGNOSIS — D649 Anemia, unspecified: Secondary | ICD-10-CM | POA: Diagnosis not present

## 2018-05-22 DIAGNOSIS — C787 Secondary malignant neoplasm of liver and intrahepatic bile duct: Secondary | ICD-10-CM | POA: Diagnosis not present

## 2018-05-22 DIAGNOSIS — C3490 Malignant neoplasm of unspecified part of unspecified bronchus or lung: Secondary | ICD-10-CM | POA: Diagnosis not present

## 2018-05-22 DIAGNOSIS — C7951 Secondary malignant neoplasm of bone: Secondary | ICD-10-CM | POA: Diagnosis not present

## 2018-05-22 MED ORDER — TOPOTECAN HCL CHEMO INJECTION 4 MG
1.3000 mg/m2 | Freq: Once | INTRAVENOUS | Status: AC
  Administered 2018-05-22: 10:00:00 2.7 mg via INTRAVENOUS
  Filled 2018-05-22: qty 2.7

## 2018-05-22 MED ORDER — SODIUM CHLORIDE 0.9 % IV SOLN
Freq: Once | INTRAVENOUS | Status: AC
  Administered 2018-05-22: 09:00:00 via INTRAVENOUS
  Filled 2018-05-22: qty 250

## 2018-05-22 MED ORDER — PROCHLORPERAZINE MALEATE 10 MG PO TABS
10.0000 mg | ORAL_TABLET | Freq: Once | ORAL | Status: AC
  Administered 2018-05-22: 10 mg via ORAL
  Filled 2018-05-22: qty 1

## 2018-05-22 MED ORDER — HEPARIN SOD (PORK) LOCK FLUSH 100 UNIT/ML IV SOLN
500.0000 [IU] | Freq: Once | INTRAVENOUS | Status: AC | PRN
  Administered 2018-05-22: 11:00:00 500 [IU]
  Filled 2018-05-22: qty 5

## 2018-05-23 ENCOUNTER — Inpatient Hospital Stay: Payer: Medicare Other

## 2018-05-23 ENCOUNTER — Other Ambulatory Visit: Payer: Self-pay

## 2018-05-23 VITALS — BP 100/64 | HR 86 | Temp 97.2°F | Resp 20

## 2018-05-23 DIAGNOSIS — C3490 Malignant neoplasm of unspecified part of unspecified bronchus or lung: Secondary | ICD-10-CM | POA: Diagnosis not present

## 2018-05-23 DIAGNOSIS — J449 Chronic obstructive pulmonary disease, unspecified: Secondary | ICD-10-CM | POA: Diagnosis not present

## 2018-05-23 DIAGNOSIS — Z5111 Encounter for antineoplastic chemotherapy: Secondary | ICD-10-CM | POA: Diagnosis not present

## 2018-05-23 DIAGNOSIS — C787 Secondary malignant neoplasm of liver and intrahepatic bile duct: Secondary | ICD-10-CM | POA: Diagnosis not present

## 2018-05-23 DIAGNOSIS — D649 Anemia, unspecified: Secondary | ICD-10-CM | POA: Diagnosis not present

## 2018-05-23 DIAGNOSIS — C7951 Secondary malignant neoplasm of bone: Secondary | ICD-10-CM | POA: Diagnosis not present

## 2018-05-23 MED ORDER — SODIUM CHLORIDE 0.9 % IV SOLN
Freq: Once | INTRAVENOUS | Status: AC
  Administered 2018-05-23: 09:00:00 via INTRAVENOUS
  Filled 2018-05-23: qty 250

## 2018-05-23 MED ORDER — HEPARIN SOD (PORK) LOCK FLUSH 100 UNIT/ML IV SOLN
500.0000 [IU] | Freq: Once | INTRAVENOUS | Status: AC
  Administered 2018-05-23: 500 [IU] via INTRAVENOUS
  Filled 2018-05-23: qty 5

## 2018-05-23 MED ORDER — SODIUM CHLORIDE 0.9% FLUSH
10.0000 mL | INTRAVENOUS | Status: DC | PRN
  Filled 2018-05-23: qty 10

## 2018-05-23 MED ORDER — PROCHLORPERAZINE MALEATE 5 MG PO TABS
10.0000 mg | ORAL_TABLET | Freq: Four times a day (QID) | ORAL | Status: DC | PRN
  Administered 2018-05-23: 10:00:00 10 mg via ORAL
  Filled 2018-05-23: qty 2

## 2018-05-23 MED ORDER — TOPOTECAN HCL CHEMO INJECTION 4 MG
1.3000 mg/m2 | Freq: Once | INTRAVENOUS | Status: AC
  Administered 2018-05-23: 2.7 mg via INTRAVENOUS
  Filled 2018-05-23: qty 2.7

## 2018-05-23 MED ORDER — PROCHLORPERAZINE MALEATE 10 MG PO TABS: 10.0000 mg | ORAL_TABLET | Freq: Once | ORAL | Status: DC

## 2018-05-24 ENCOUNTER — Other Ambulatory Visit: Payer: Self-pay

## 2018-05-24 ENCOUNTER — Inpatient Hospital Stay: Payer: Medicare Other

## 2018-05-24 VITALS — BP 98/66 | HR 93 | Temp 98.2°F | Resp 19

## 2018-05-24 DIAGNOSIS — J449 Chronic obstructive pulmonary disease, unspecified: Secondary | ICD-10-CM | POA: Diagnosis not present

## 2018-05-24 DIAGNOSIS — C787 Secondary malignant neoplasm of liver and intrahepatic bile duct: Secondary | ICD-10-CM | POA: Diagnosis not present

## 2018-05-24 DIAGNOSIS — Z5111 Encounter for antineoplastic chemotherapy: Secondary | ICD-10-CM | POA: Diagnosis not present

## 2018-05-24 DIAGNOSIS — C3490 Malignant neoplasm of unspecified part of unspecified bronchus or lung: Secondary | ICD-10-CM

## 2018-05-24 DIAGNOSIS — D649 Anemia, unspecified: Secondary | ICD-10-CM | POA: Diagnosis not present

## 2018-05-24 DIAGNOSIS — C7951 Secondary malignant neoplasm of bone: Secondary | ICD-10-CM | POA: Diagnosis not present

## 2018-05-24 MED ORDER — SODIUM CHLORIDE 0.9% FLUSH
10.0000 mL | INTRAVENOUS | Status: DC | PRN
  Administered 2018-05-24: 09:00:00 10 mL
  Filled 2018-05-24: qty 10

## 2018-05-24 MED ORDER — TOPOTECAN HCL CHEMO INJECTION 4 MG
1.3000 mg/m2 | Freq: Once | INTRAVENOUS | Status: AC
  Administered 2018-05-24: 10:00:00 2.7 mg via INTRAVENOUS
  Filled 2018-05-24: qty 2.7

## 2018-05-24 MED ORDER — SODIUM CHLORIDE 0.9 % IV SOLN
Freq: Once | INTRAVENOUS | Status: AC
  Administered 2018-05-24: 09:00:00 via INTRAVENOUS
  Filled 2018-05-24: qty 250

## 2018-05-24 MED ORDER — PROCHLORPERAZINE MALEATE 5 MG PO TABS
10.0000 mg | ORAL_TABLET | Freq: Once | ORAL | Status: AC
  Administered 2018-05-24: 10:00:00 10 mg via ORAL
  Filled 2018-05-24: qty 2

## 2018-05-24 MED ORDER — HEPARIN SOD (PORK) LOCK FLUSH 100 UNIT/ML IV SOLN
500.0000 [IU] | Freq: Once | INTRAVENOUS | Status: AC | PRN
  Administered 2018-05-24: 500 [IU]
  Filled 2018-05-24: qty 5

## 2018-05-25 ENCOUNTER — Other Ambulatory Visit: Payer: Self-pay

## 2018-05-25 ENCOUNTER — Inpatient Hospital Stay: Payer: Medicare Other

## 2018-05-25 VITALS — BP 115/74 | HR 99 | Temp 97.9°F | Resp 18

## 2018-05-25 DIAGNOSIS — C7951 Secondary malignant neoplasm of bone: Secondary | ICD-10-CM | POA: Diagnosis not present

## 2018-05-25 DIAGNOSIS — C3490 Malignant neoplasm of unspecified part of unspecified bronchus or lung: Secondary | ICD-10-CM | POA: Diagnosis not present

## 2018-05-25 DIAGNOSIS — D649 Anemia, unspecified: Secondary | ICD-10-CM | POA: Diagnosis not present

## 2018-05-25 DIAGNOSIS — C787 Secondary malignant neoplasm of liver and intrahepatic bile duct: Secondary | ICD-10-CM | POA: Diagnosis not present

## 2018-05-25 DIAGNOSIS — J449 Chronic obstructive pulmonary disease, unspecified: Secondary | ICD-10-CM | POA: Diagnosis not present

## 2018-05-25 DIAGNOSIS — Z5111 Encounter for antineoplastic chemotherapy: Secondary | ICD-10-CM | POA: Diagnosis not present

## 2018-05-25 MED ORDER — HEPARIN SOD (PORK) LOCK FLUSH 100 UNIT/ML IV SOLN
500.0000 [IU] | Freq: Once | INTRAVENOUS | Status: AC
  Administered 2018-05-25: 11:00:00 500 [IU] via INTRAVENOUS

## 2018-05-25 MED ORDER — SODIUM CHLORIDE 0.9 % IV SOLN
Freq: Once | INTRAVENOUS | Status: AC
  Administered 2018-05-25: 10:00:00 via INTRAVENOUS
  Filled 2018-05-25: qty 250

## 2018-05-25 MED ORDER — HEPARIN SOD (PORK) LOCK FLUSH 100 UNIT/ML IV SOLN
INTRAVENOUS | Status: AC
  Filled 2018-05-25: qty 5

## 2018-05-25 MED ORDER — SODIUM CHLORIDE 0.9% FLUSH
10.0000 mL | INTRAVENOUS | Status: DC | PRN
  Administered 2018-05-25: 10 mL via INTRAVENOUS
  Filled 2018-05-25: qty 10

## 2018-05-25 MED ORDER — TOPOTECAN HCL CHEMO INJECTION 4 MG
1.3000 mg/m2 | Freq: Once | INTRAVENOUS | Status: AC
  Administered 2018-05-25: 2.7 mg via INTRAVENOUS
  Filled 2018-05-25: qty 2.7

## 2018-05-25 MED ORDER — PROCHLORPERAZINE MALEATE 10 MG PO TABS
10.0000 mg | ORAL_TABLET | Freq: Once | ORAL | Status: AC
  Administered 2018-05-25: 10 mg via ORAL
  Filled 2018-05-25: qty 1

## 2018-06-05 ENCOUNTER — Telehealth: Payer: Self-pay

## 2018-06-05 NOTE — Telephone Encounter (Signed)
No answer and unable to leave voicemail when calling patient to set up follow up palliative care appointment in the home. Will reattempt at a later date.

## 2018-06-07 ENCOUNTER — Other Ambulatory Visit: Payer: Self-pay | Admitting: Oncology

## 2018-06-07 ENCOUNTER — Telehealth: Payer: Self-pay | Admitting: *Deleted

## 2018-06-07 MED ORDER — MORPHINE SULFATE ER 30 MG PO TBCR: 30.0000 mg | EXTENDED_RELEASE_TABLET | Freq: Three times a day (TID) | ORAL | 0 refills | Status: DC

## 2018-06-07 NOTE — Telephone Encounter (Signed)
That's fine.  I cannot sign the pended script for some reason though.
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
## 2018-06-07 NOTE — Telephone Encounter (Signed)
Left message on Grace [name]'s voice mail that prescription was sent but they may have to pay for it out of pocket

## 2018-06-07 NOTE — Telephone Encounter (Signed)
That worked. Thank you.

## 2018-06-15 ENCOUNTER — Ambulatory Visit
Admission: RE | Admit: 2018-06-15 | Discharge: 2018-06-15 | Disposition: A | Discharge status: Home / Self Care | Payer: Medicare Other | Source: Ambulatory Visit | Attending: Oncology | Admitting: Oncology

## 2018-06-15 ENCOUNTER — Other Ambulatory Visit: Payer: Self-pay

## 2018-06-15 DIAGNOSIS — C3490 Malignant neoplasm of unspecified part of unspecified bronchus or lung: Secondary | ICD-10-CM | POA: Diagnosis not present

## 2018-06-15 DIAGNOSIS — C7951 Secondary malignant neoplasm of bone: Secondary | ICD-10-CM | POA: Diagnosis not present

## 2018-06-15 DIAGNOSIS — C787 Secondary malignant neoplasm of liver and intrahepatic bile duct: Secondary | ICD-10-CM | POA: Diagnosis not present

## 2018-06-15 HISTORY — DX: Malignant neoplasm of unspecified part of unspecified bronchus or lung: C34.90

## 2018-06-15 MED ORDER — IOHEXOL 300 MG/ML  SOLN
100.0000 mL | Freq: Once | INTRAMUSCULAR | Status: AC | PRN
  Administered 2018-06-15: 100 mL via INTRAVENOUS

## 2018-06-17 ENCOUNTER — Other Ambulatory Visit: Payer: Self-pay

## 2018-06-17 NOTE — Progress Notes (Signed)
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  INTERVAL HISTORY: Patient returns to clinic today for further evaluation, discussion of her imaging results, and consideration of cycle 7 of topotecan.  She has tolerated her treatments relatively well.  She continues to have chronic pain, but this is well controlled on her current dose of MS Contin.  She has chronic weakness and fatigue as well as occasional nausea.  She has no neurologic complaints.  She denies any recent fevers or illnesses.  She has a good appetite and denies weight loss.  She has chronic shortness of breath, but denies any chest pain or hemoptysis. She has no abdominal pain.  She denies any melena or hematochezia.  She has no urinary complaints.  Patient offers no further specific complaints today.  REVIEW OF SYSTEMS:   Review of Systems  Constitutional: Positive for malaise/fatigue. Negative for fever and weight loss.  Respiratory: Positive for shortness of breath. Negative for cough, hemoptysis and wheezing.   Cardiovascular: Negative.  Negative for chest pain and leg swelling.  Gastrointestinal: Positive for nausea. Negative for abdominal pain, blood in stool, diarrhea, melena and vomiting.  Genitourinary: Negative.  Negative for dysuria and frequency.  Musculoskeletal: Positive for back pain, joint pain and neck pain.  Skin: Negative.  Negative for rash.  Neurological: Positive for weakness. Negative for sensory change, focal weakness and headaches.  Psychiatric/Behavioral: Negative.  Negative for memory loss. The patient is not nervous/anxious.     As per HPI. Otherwise, a complete review of systems is negative.  PAST  MEDICAL HISTORY: Past Medical History:  Diagnosis Date   Anxiety    Arthritis    Asthma    COPD   CAP (community acquired pneumonia) 06/30/2014   Cardiomyopathy ([place])    nonischemic (EF 35-40%)   CHF (congestive heart failure) (HCC)    COPD (chronic obstructive pulmonary disease) (HCC)    Depression    Dyspnea    GERD (gastroesophageal reflux disease)    Hyperlipidemia    Hypertension    Pneumonia    PVC's (premature ventricular contractions)    states 10 years ago   Sepsis ([place]) 06/30/2014   Small cell lung cancer ([place]) 12/2016   Chemo tx's.   SOB (shortness of breath) 05/08/2014   Spinal cord injury at T7-T12 level [facility])    2016    PAST SURGICAL HISTORY: Past Surgical History:  Procedure Laterality Date   APPENDECTOMY     CARDIAC CATHETERIZATION     CATARACT EXTRACTION     right eye    CESAREAN SECTION     COLONOSCOPY     LAMINOTOMY     LUMBAR LAMINECTOMY/DECOMPRESSION MICRODISCECTOMY Left 08/01/2016   Procedure: Left Lumbar Four-Five Laminectomy and Foraminotomy;  Surgeon: Earnie Larsson, MD;  Location: [place];  Service: Neurosurgery;  Laterality: Left;   PORTA CATH INSERTION N/A 02/03/2017   Procedure: PORTA CATH INSERTION;  Surgeon: Katha Cabal, MD;  Location: [facility];  Service: Cardiovascular;  Laterality: N/A;    FAMILY HISTORY: Family History  Problem Relation Age of Onset   Heart attack Father 74       MI x 3     ADVANCED DIRECTIVES (Y/N):  N  HEALTH MAINTENANCE:  Social History   Tobacco Use   Smoking status: Former Smoker    Packs/day: 0.50    Years: 50.00    Pack years: 25.00    Types: Cigarettes    Last attempt to quit: 01/27/2015    Years since quitting: 3.3   Smokeless tobacco: Never Used  Substance Use Topics   Alcohol use: No   Drug use: No     Colonoscopy:  PAP:  Bone density:  Lipid panel:  Allergies  Allergen Reactions   Fish Allergy Anaphylaxis    Per pt after eating [facility]  she had throat swelling and SOB. MSY    Fentanyl     Overly sedated and groggy, ineffective     Current Outpatient Medications  Medication Sig Dispense Refill   ADVAIR DISKUS 250-50 MCG/DOSE AEPB Inhale 1 puff into the lungs 2 (two) times daily.     albuterol (PROAIR HFA) 108 (90 Base) MCG/ACT inhaler Inhale 2 puffs into the lungs every 6 (six) hours as needed for wheezing or shortness of breath.      alendronate (FOSAMAX) 70 MG tablet Take 70 mg by mouth every Thursday.      ARIPiprazole (ABILIFY) 5 MG tablet Take 5 mg by mouth daily.     BELBUCA 150 MCG FILM Take 1 tablet by mouth daily.  0   benzonatate (TESSALON) 100 MG capsule Take 1 capsule (100 mg total) by mouth every 8 (eight) hours as needed for cough. (Patient not taking: Reported on 05/21/2018) 20 capsule 0   dextromethorphan-guaiFENesin (MUCINEX DM) 30-600 MG 12hr tablet Take 1 tablet by mouth 2 (two) times daily as needed for cough.     diphenoxylate-atropine (LOMOTIL) 2.5-0.025 MG tablet Take 1 tablet by mouth 4 (four) times daily as needed for diarrhea or loose stools. 30 tablet 0   DULoxetine (CYMBALTA) 60 MG capsule Take 60 mg by mouth daily.     ipratropium-albuterol (DUONEB) 0.5-2.5 (3) MG/3ML SOLN Take 3 mLs by nebulization every 4 (four) hours as needed. 360 mL 1   LORazepam (ATIVAN) 0.5 MG tablet Take 0.5 mg by mouth as needed.     metoprolol succinate (TOPROL-XL) 100 MG 24 hr tablet Take 1 tablet by mouth 1 day or 1 dose.     morphine (MS CONTIN) 30 MG 12 hr tablet Take 1 tablet (30 mg total) by mouth every 8 (eight) hours. 90 tablet 0   pantoprazole (PROTONIX) 40 MG tablet Take 1 tablet by mouth daily.     potassium chloride SA (K-DUR,KLOR-CON) 20 MEQ tablet Take 1 tablet (20 mEq total) by mouth daily. 30 tablet 2   prochlorperazine (COMPAZINE) 10 MG tablet Take 1 tablet (10 mg total) by mouth every 6 (six) hours as needed (Nausea or vomiting). 60 tablet 2   promethazine (PHENERGAN) 25 MG tablet Take 1  tablet (25 mg total) by mouth every 8 (eight) hours as needed for nausea or vomiting. 30 tablet 2   sacubitril-valsartan (ENTRESTO) 24-26 MG Take 1 tablet by mouth 2 (two) times daily. 180 tablet 3   tiZANidine (ZANAFLEX) 4 MG tablet Take 1 tablet (4 mg total) by mouth every 8 (eight) hours as needed for muscle spasms. (Patient not taking: Reported on 05/21/2018) 90 tablet 2   No current facility-administered medications for this visit.    Facility-Administered Medications Ordered in Other Visits  Medication Dose Route Frequency Provider Last Rate Last Dose   sodium chloride flush (NS) 0.9 % injection 10 mL  10 mL Intravenous PRN Delight Hoh  J, MD   10 mL at 03/01/17 0841   sodium chloride flush (NS) 0.9 % injection 10 mL  10 mL Intravenous PRN Lloyd Huger, MD   10 mL at 06/18/18 0931    OBJECTIVE: Vitals:   06/18/18 0955  BP: 101/72  Pulse: 78  Resp: 18  Temp: (!) 97.5 F (36.4 C)     There is no height or weight on file to calculate BMI.    ECOG FS:2 - Symptomatic, <50% confined to bed  General: Well-developed, well-nourished, no acute distress.  Sitting in a wheelchair. Eyes: Pink conjunctiva, anicteric sclera. HEENT: Normocephalic, moist mucous membranes. Lungs: Clear to auscultation bilaterally. Heart: Regular rate and rhythm. No rubs, murmurs, or gallops. Abdomen: Soft, nontender, nondistended. No organomegaly noted, normoactive bowel sounds. Musculoskeletal: No edema, cyanosis, or clubbing. Neuro: Alert, answering all questions appropriately. Cranial nerves grossly intact. Skin: No rashes or petechiae noted. Psych: Normal affect.  LAB RESULTS:  Lab Results  Component Value Date   NA 138 06/18/2018   K 3.2 (L) 06/18/2018   CL 105 06/18/2018   CO2 26 06/18/2018   GLUCOSE 118 (H) 06/18/2018   BUN 11 06/18/2018   CREATININE 0.88 06/18/2018   CALCIUM 8.2 (L) 06/18/2018   PROT 7.5 06/18/2018   ALBUMIN 3.3 (L) 06/18/2018   AST 26 06/18/2018   ALT 25  06/18/2018   ALKPHOS 80 06/18/2018   BILITOT 0.3 06/18/2018   GFRNONAA >60 06/18/2018   GFRAA >60 06/18/2018    Lab Results  Component Value Date   WBC 5.9 06/18/2018   NEUTROABS 3.5 06/18/2018   HGB 9.1 (L) 06/18/2018   HCT 29.6 (L) 06/18/2018   MCV 96.4 06/18/2018   PLT 250 06/18/2018     STUDIES: Ct Chest W Contrast  Result Date: 06/15/2018 CLINICAL DATA:  Stage IV small cell lung cancer, with hepatic and osseous metastases, status post radiation therapy, on immunotherapy. EXAM: CT CHEST, ABDOMEN, AND PELVIS WITH CONTRAST TECHNIQUE: Multidetector CT imaging of the chest, abdomen and pelvis was performed following the standard protocol during bolus administration of intravenous contrast. CONTRAST:  155mL OMNIPAQUE IOHEXOL 300 MG/ML  SOLN COMPARISON:  03/22/2018 FINDINGS: CT CHEST FINDINGS Cardiovascular: Heart is normal in size.  No pericardial effusion. No evidence of thoracic aortic aneurysm. Atherosclerotic calcifications of the aortic arch. Three vessel coronary atherosclerosis. Right chest port terminates the cavoatrial junction. Mediastinum/Nodes: 10 mm short axis right hilar node (series 2/image 27), previously 11 mm. Prior subcarinal node has resolved. Visualized thyroid is unremarkable. Lungs/Pleura: Left suprahilar/posterior left upper lobe nodule now measures 2.1 cm in maximal AP dimension (series 3/image 50), previously 2.3 cm. Surrounding radiation changes. 7 x 6 mm mixed density nodule in the lateral right lower lobe (series 3/image 93), previously 8 x 11 mm, mildly decreased. 8 x 5 mm subpleural nodule in the left lower lobe (series 3/image 96), grossly unchanged. Mild centrilobular emphysematous changes, upper lung predominant. No focal consolidation. No pleural effusion or pneumothorax. Musculoskeletal: Severe compression fracture deformity at T5 with associated kyphotic deformity. Mild to moderate compression fracture deformity at T7. Multifocal osseous sclerotic metastases,  including T2, T8, T10, and T11, similar to the prior. CT ABDOMEN PELVIS FINDINGS Hepatobiliary: Nodular hepatic contour. Improving hepatic metastases, including a 1.9 x 2.5 cm index lesion in segment 3 (series 2/image 62), previously 2.7 x 2.0 cm. Additional 3.1 x 2.6 cm lesion in segment 4B (series 2/image 60), previously 3.7 x 2.7 cm. Gallbladder is unremarkable. No intrahepatic ductal dilatation. Dilated common duct, measuring  1.8 cm, previously 1.6 cm. No choledocholithiasis is evident on CT. Pancreas: Within normal limits. Spleen: Within normal limits. Adrenals/Urinary Tract: Adrenal glands are within normal limits. Kidneys are within normal limits.  No hydronephrosis. Bladder is within normal limits. Stomach/Bowel: Stomach is within normal limits. No evidence of bowel obstruction. Appendix is not discretely visualized. Vascular/Lymphatic: No evidence of abdominal aortic aneurysm. Atherosclerotic calcifications of the abdominal aorta and branch vessels. No suspicious abdominopelvic lymphadenopathy. Reproductive: Uterus is unremarkable. Bilateral ovaries are within normal limits. Other: No abdominopelvic ascites. Musculoskeletal: Mild superior endplate compression fracture deformity at L1. Grade 1 anterolisthesis of L4 on L5. Mild degenerative changes of the lumbar spine. Stable sclerotic metastases involving the bilateral iliac bones. Stable sclerosis involving the bilateral femoral heads, nonspecific. IMPRESSION: 2.1 cm nodule in the posterior left upper lobe, mildly improved. Associated radiation changes. Subcentimeter right lower lobe nodule is mildly decreased. Small thoracic nodes, improved. Improving hepatic metastases, with index lesions as above. Multifocal osseous metastases, grossly unchanged. Associated severe compression fracture at T5 with kyphosis. Progressive extrahepatic ductal dilatation, measuring up to 1.8 cm. No choledocholithiasis is evident on CT. Correlate with LFTs and consider ERCP/MRCP  as clinically warranted. Electronically Signed   By: Julian Hy M.D.   On: 06/15/2018 13:07   Ct Abdomen Pelvis W Contrast  Result Date: 06/15/2018 CLINICAL DATA:  Stage IV small cell lung cancer, with hepatic and osseous metastases, status post radiation therapy, on immunotherapy. EXAM: CT CHEST, ABDOMEN, AND PELVIS WITH CONTRAST TECHNIQUE: Multidetector CT imaging of the chest, abdomen and pelvis was performed following the standard protocol during bolus administration of intravenous contrast. CONTRAST:  138mL OMNIPAQUE IOHEXOL 300 MG/ML  SOLN COMPARISON:  03/22/2018 FINDINGS: CT CHEST FINDINGS Cardiovascular: Heart is normal in size.  No pericardial effusion. No evidence of thoracic aortic aneurysm. Atherosclerotic calcifications of the aortic arch. Three vessel coronary atherosclerosis. Right chest port terminates the cavoatrial junction. Mediastinum/Nodes: 10 mm short axis right hilar node (series 2/image 27), previously 11 mm. Prior subcarinal node has resolved. Visualized thyroid is unremarkable. Lungs/Pleura: Left suprahilar/posterior left upper lobe nodule now measures 2.1 cm in maximal AP dimension (series 3/image 50), previously 2.3 cm. Surrounding radiation changes. 7 x 6 mm mixed density nodule in the lateral right lower lobe (series 3/image 93), previously 8 x 11 mm, mildly decreased. 8 x 5 mm subpleural nodule in the left lower lobe (series 3/image 96), grossly unchanged. Mild centrilobular emphysematous changes, upper lung predominant. No focal consolidation. No pleural effusion or pneumothorax. Musculoskeletal: Severe compression fracture deformity at T5 with associated kyphotic deformity. Mild to moderate compression fracture deformity at T7. Multifocal osseous sclerotic metastases, including T2, T8, T10, and T11, similar to the prior. CT ABDOMEN PELVIS FINDINGS Hepatobiliary: Nodular hepatic contour. Improving hepatic metastases, including a 1.9 x 2.5 cm index lesion in segment 3 (series  2/image 62), previously 2.7 x 2.0 cm. Additional 3.1 x 2.6 cm lesion in segment 4B (series 2/image 60), previously 3.7 x 2.7 cm. Gallbladder is unremarkable. No intrahepatic ductal dilatation. Dilated common duct, measuring 1.8 cm, previously 1.6 cm. No choledocholithiasis is evident on CT. Pancreas: Within normal limits. Spleen: Within normal limits. Adrenals/Urinary Tract: Adrenal glands are within normal limits. Kidneys are within normal limits.  No hydronephrosis. Bladder is within normal limits. Stomach/Bowel: Stomach is within normal limits. No evidence of bowel obstruction. Appendix is not discretely visualized. Vascular/Lymphatic: No evidence of abdominal aortic aneurysm. Atherosclerotic calcifications of the abdominal aorta and branch vessels. No suspicious abdominopelvic lymphadenopathy. Reproductive: Uterus is unremarkable. Bilateral  ovaries are within normal limits. Other: No abdominopelvic ascites. Musculoskeletal: Mild superior endplate compression fracture deformity at L1. Grade 1 anterolisthesis of L4 on L5. Mild degenerative changes of the lumbar spine. Stable sclerotic metastases involving the bilateral iliac bones. Stable sclerosis involving the bilateral femoral heads, nonspecific. IMPRESSION: 2.1 cm nodule in the posterior left upper lobe, mildly improved. Associated radiation changes. Subcentimeter right lower lobe nodule is mildly decreased. Small thoracic nodes, improved. Improving hepatic metastases, with index lesions as above. Multifocal osseous metastases, grossly unchanged. Associated severe compression fracture at T5 with kyphosis. Progressive extrahepatic ductal dilatation, measuring up to 1.8 cm. No choledocholithiasis is evident on CT. Correlate with LFTs and consider ERCP/MRCP as clinically warranted. Electronically Signed   By: Julian Hy M.D.   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  PLAN:  1. Stage IV  small cell lung cancer with liver and bone metastasis: Patient received 6 cycles of carboplatin, etoposide, and Tecentriq starting on February 08, 2017 and completing on June 02, 2017.  Patient then received maintenance Tecentriq from Jun 21, 2017 through November 15, 2017.  CT scan results from December 25, 2017 reviewed independently with progressive disease in her mediastinum as well as significant progression of multifocal hepatic disease.  Hospice was briefly discussed, but patient wished to continue aggressive treatment.  Patient was initially scheduled to get topotecan on days 1 through 5 every 21 days, but given her persistent thrombocytopenia she can only receive treatment every 28 days.  Patient's most recent imaging on Jun 15, 2018 reviewed independently and reported as above with improvement in patient's known metastatic disease in her liver.  Proceed with cycle 7, day 1 of dose reduced topotecan today.  Return to clinic Tuesday through Friday for treatment only.  Patient will then return to clinic in 1 month for further evaluation and consideration of cycle 8, day 1.   2. Pain: Continue MS Contin to 30 mg every 8 hours. Patient states she has "fired" her pain doctor.  3. Thrombocytopenia: Resolved.  Proceed with treatment every 28 days as above. 4.  Anemia: Patient's hemoglobin has trended down slightly to 9.1 today, monitor. 5.  Neutropenia: Resolved.   6.  COPD: Chronic and unchanged.  Continue outpatient palliative care.  Continue current medications as prescribed. 7.  Hypokalemia: Mild.  Patient has been instructed to reinitiate her oral potassium supplementation.   8.  Disposition: Hospice and end-of-life care were previously discussed, but patient wishes to continue aggressive treatments.  She expressed understanding that her treatment options are limited.  She confirms that she is a DNR and is having outpatient palliative care visit.  Patient expressed understanding and was in agreement  with this plan. She also understands that She can call clinic at any time with any questions, concerns, or complaints.   Cancer Staging Small cell lung cancer [facility]) Staging form: Lung, AJCC 8th Edition - Clinical stage from 01/28/2017: Stage IV (cT4, cN3, pM1c) - Signed by Lloyd Huger, MD on 01/28/2017   Lloyd Huger, MD   06/18/2018 12:47 PM

## 2018-06-18 ENCOUNTER — Inpatient Hospital Stay: Payer: Medicare Other

## 2018-06-18 ENCOUNTER — Encounter: Payer: Self-pay | Admitting: Oncology

## 2018-06-18 ENCOUNTER — Other Ambulatory Visit: Payer: Self-pay

## 2018-06-18 ENCOUNTER — Inpatient Hospital Stay: Payer: Medicare Other | Attending: Oncology | Admitting: Oncology

## 2018-06-18 VITALS — BP 101/72 | HR 78 | Temp 97.5°F | Resp 18

## 2018-06-18 DIAGNOSIS — I110 Hypertensive heart disease with heart failure: Secondary | ICD-10-CM | POA: Diagnosis not present

## 2018-06-18 DIAGNOSIS — E876 Hypokalemia: Secondary | ICD-10-CM | POA: Diagnosis not present

## 2018-06-18 DIAGNOSIS — R110 Nausea: Secondary | ICD-10-CM | POA: Insufficient documentation

## 2018-06-18 DIAGNOSIS — C787 Secondary malignant neoplasm of liver and intrahepatic bile duct: Secondary | ICD-10-CM

## 2018-06-18 DIAGNOSIS — I509 Heart failure, unspecified: Secondary | ICD-10-CM | POA: Insufficient documentation

## 2018-06-18 DIAGNOSIS — R5383 Other fatigue: Secondary | ICD-10-CM | POA: Diagnosis not present

## 2018-06-18 DIAGNOSIS — Z87891 Personal history of nicotine dependence: Secondary | ICD-10-CM

## 2018-06-18 DIAGNOSIS — D649 Anemia, unspecified: Secondary | ICD-10-CM | POA: Insufficient documentation

## 2018-06-18 DIAGNOSIS — J449 Chronic obstructive pulmonary disease, unspecified: Secondary | ICD-10-CM | POA: Diagnosis not present

## 2018-06-18 DIAGNOSIS — Z5111 Encounter for antineoplastic chemotherapy: Secondary | ICD-10-CM

## 2018-06-18 DIAGNOSIS — G893 Neoplasm related pain (acute) (chronic): Secondary | ICD-10-CM | POA: Diagnosis not present

## 2018-06-18 DIAGNOSIS — Z79899 Other long term (current) drug therapy: Secondary | ICD-10-CM | POA: Diagnosis not present

## 2018-06-18 DIAGNOSIS — C3490 Malignant neoplasm of unspecified part of unspecified bronchus or lung: Secondary | ICD-10-CM

## 2018-06-18 DIAGNOSIS — C3412 Malignant neoplasm of upper lobe, left bronchus or lung: Secondary | ICD-10-CM | POA: Diagnosis not present

## 2018-06-18 DIAGNOSIS — R531 Weakness: Secondary | ICD-10-CM | POA: Diagnosis not present

## 2018-06-18 DIAGNOSIS — C7951 Secondary malignant neoplasm of bone: Secondary | ICD-10-CM | POA: Diagnosis not present

## 2018-06-18 DIAGNOSIS — F329 Major depressive disorder, single episode, unspecified: Secondary | ICD-10-CM | POA: Insufficient documentation

## 2018-06-18 DIAGNOSIS — I429 Cardiomyopathy, unspecified: Secondary | ICD-10-CM | POA: Diagnosis not present

## 2018-06-18 LAB — CBC WITH DIFFERENTIAL/PLATELET
Abs Immature Granulocytes: 0.06 10*3/uL (ref 0.00–0.07)
Basophils Absolute: 0 10*3/uL (ref 0.0–0.1)
Basophils Relative: 1 %
Eosinophils Absolute: 0 10*3/uL (ref 0.0–0.5)
Eosinophils Relative: 0 %
HCT: 29.6 % — ABNORMAL LOW (ref 36.0–46.0)
Hemoglobin: 9.1 g/dL — ABNORMAL LOW (ref 12.0–15.0)
Immature Granulocytes: 1 %
Lymphocytes Relative: 28 %
Lymphs Abs: 1.7 10*3/uL (ref 0.7–4.0)
MCH: 29.6 pg (ref 26.0–34.0)
MCHC: 30.7 g/dL (ref 30.0–36.0)
MCV: 96.4 fL (ref 80.0–100.0)
Monocytes Absolute: 0.6 10*3/uL (ref 0.1–1.0)
Monocytes Relative: 11 %
Neutro Abs: 3.5 10*3/uL (ref 1.7–7.7)
Neutrophils Relative %: 59 %
Platelets: 250 10*3/uL (ref 150–400)
RBC: 3.07 MIL/uL — ABNORMAL LOW (ref 3.87–5.11)
RDW: 18.2 % — ABNORMAL HIGH (ref 11.5–15.5)
WBC: 5.9 10*3/uL (ref 4.0–10.5)
nRBC: 0 % (ref 0.0–0.2)

## 2018-06-18 LAB — COMPREHENSIVE METABOLIC PANEL
ALT: 25 U/L (ref 0–44)
AST: 26 U/L (ref 15–41)
Albumin: 3.3 g/dL — ABNORMAL LOW (ref 3.5–5.0)
Alkaline Phosphatase: 80 U/L (ref 38–126)
Anion gap: 7 (ref 5–15)
BUN: 11 mg/dL (ref 8–23)
CO2: 26 mmol/L (ref 22–32)
Calcium: 8.2 mg/dL — ABNORMAL LOW (ref 8.9–10.3)
Chloride: 105 mmol/L (ref 98–111)
Creatinine, Ser: 0.88 mg/dL (ref 0.44–1.00)
GFR calc Af Amer: >60 mL/min (ref >60)
GFR calc non Af Amer: >60 mL/min (ref >60)
Glucose, Bld: 118 mg/dL — ABNORMAL HIGH (ref 70–99)
Potassium: 3.2 mmol/L — ABNORMAL LOW (ref 3.5–5.1)
Sodium: 138 mmol/L (ref 135–145)
Total Bilirubin: 0.3 mg/dL (ref 0.3–1.2)
Total Protein: 7.5 g/dL (ref 6.5–8.1)

## 2018-06-18 LAB — SAMPLE TO BLOOD BANK

## 2018-06-18 MED ORDER — TOPOTECAN HCL CHEMO INJECTION 4 MG
1.3000 mg/m2 | Freq: Once | INTRAVENOUS | Status: AC
  Administered 2018-06-18: 11:00:00 2.7 mg via INTRAVENOUS
  Filled 2018-06-18: qty 2.7

## 2018-06-18 MED ORDER — PROCHLORPERAZINE MALEATE 10 MG PO TABS
10.0000 mg | ORAL_TABLET | Freq: Once | ORAL | Status: AC
  Administered 2018-06-18: 10 mg via ORAL
  Filled 2018-06-18: qty 1

## 2018-06-18 MED ORDER — SODIUM CHLORIDE 0.9% FLUSH
10.0000 mL | INTRAVENOUS | Status: DC | PRN
  Administered 2018-06-18: 10 mL via INTRAVENOUS
  Filled 2018-06-18: qty 10

## 2018-06-18 MED ORDER — SODIUM CHLORIDE 0.9 % IV SOLN
Freq: Once | INTRAVENOUS | Status: AC
  Administered 2018-06-18: 11:00:00 via INTRAVENOUS
  Filled 2018-06-18: qty 250

## 2018-06-18 MED ORDER — HEPARIN SOD (PORK) LOCK FLUSH 100 UNIT/ML IV SOLN
500.0000 [IU] | Freq: Once | INTRAVENOUS | Status: AC
  Administered 2018-06-18: 12:00:00 500 [IU] via INTRAVENOUS
  Filled 2018-06-18: qty 5

## 2018-06-18 NOTE — Progress Notes (Signed)
Patient here today for follow up regarding lung cancer, CT results.

## 2018-06-19 ENCOUNTER — Other Ambulatory Visit: Payer: Self-pay

## 2018-06-19 ENCOUNTER — Inpatient Hospital Stay: Payer: Medicare Other

## 2018-06-19 VITALS — BP 112/74 | HR 88 | Temp 96.0°F | Resp 18

## 2018-06-19 DIAGNOSIS — C787 Secondary malignant neoplasm of liver and intrahepatic bile duct: Secondary | ICD-10-CM | POA: Diagnosis not present

## 2018-06-19 DIAGNOSIS — C3412 Malignant neoplasm of upper lobe, left bronchus or lung: Secondary | ICD-10-CM | POA: Diagnosis not present

## 2018-06-19 DIAGNOSIS — D649 Anemia, unspecified: Secondary | ICD-10-CM | POA: Diagnosis not present

## 2018-06-19 DIAGNOSIS — C7951 Secondary malignant neoplasm of bone: Secondary | ICD-10-CM | POA: Diagnosis not present

## 2018-06-19 DIAGNOSIS — Z5111 Encounter for antineoplastic chemotherapy: Secondary | ICD-10-CM | POA: Diagnosis not present

## 2018-06-19 DIAGNOSIS — C3490 Malignant neoplasm of unspecified part of unspecified bronchus or lung: Secondary | ICD-10-CM

## 2018-06-19 DIAGNOSIS — J449 Chronic obstructive pulmonary disease, unspecified: Secondary | ICD-10-CM | POA: Diagnosis not present

## 2018-06-19 MED ORDER — HEPARIN SOD (PORK) LOCK FLUSH 100 UNIT/ML IV SOLN
500.0000 [IU] | Freq: Once | INTRAVENOUS | Status: AC | PRN
  Administered 2018-06-19: 15:00:00 500 [IU]
  Filled 2018-06-19: qty 5

## 2018-06-19 MED ORDER — PROCHLORPERAZINE MALEATE 10 MG PO TABS
10.0000 mg | ORAL_TABLET | Freq: Once | ORAL | Status: AC
  Administered 2018-06-19: 10 mg via ORAL
  Filled 2018-06-19: qty 1

## 2018-06-19 MED ORDER — TOPOTECAN HCL CHEMO INJECTION 4 MG
1.3000 mg/m2 | Freq: Once | INTRAVENOUS | Status: AC
  Administered 2018-06-19: 14:00:00 2.7 mg via INTRAVENOUS
  Filled 2018-06-19: qty 2.7

## 2018-06-19 MED ORDER — SODIUM CHLORIDE 0.9 % IV SOLN
Freq: Once | INTRAVENOUS | Status: AC
  Administered 2018-06-19: 14:00:00 via INTRAVENOUS
  Filled 2018-06-19: qty 250

## 2018-06-20 ENCOUNTER — Inpatient Hospital Stay: Payer: Medicare Other

## 2018-06-20 ENCOUNTER — Other Ambulatory Visit: Payer: Self-pay

## 2018-06-20 DIAGNOSIS — Z5111 Encounter for antineoplastic chemotherapy: Secondary | ICD-10-CM | POA: Diagnosis not present

## 2018-06-20 DIAGNOSIS — C3412 Malignant neoplasm of upper lobe, left bronchus or lung: Secondary | ICD-10-CM | POA: Diagnosis not present

## 2018-06-20 DIAGNOSIS — C7951 Secondary malignant neoplasm of bone: Secondary | ICD-10-CM | POA: Diagnosis not present

## 2018-06-20 DIAGNOSIS — C787 Secondary malignant neoplasm of liver and intrahepatic bile duct: Secondary | ICD-10-CM | POA: Diagnosis not present

## 2018-06-20 DIAGNOSIS — J449 Chronic obstructive pulmonary disease, unspecified: Secondary | ICD-10-CM | POA: Diagnosis not present

## 2018-06-20 DIAGNOSIS — D649 Anemia, unspecified: Secondary | ICD-10-CM | POA: Diagnosis not present

## 2018-06-20 DIAGNOSIS — C3490 Malignant neoplasm of unspecified part of unspecified bronchus or lung: Secondary | ICD-10-CM

## 2018-06-20 MED ORDER — SODIUM CHLORIDE 0.9% FLUSH
10.0000 mL | INTRAVENOUS | Status: DC | PRN
  Administered 2018-06-20: 10 mL via INTRAVENOUS
  Filled 2018-06-20: qty 10

## 2018-06-20 MED ORDER — TOPOTECAN HCL CHEMO INJECTION 4 MG
1.3000 mg/m2 | Freq: Once | INTRAVENOUS | Status: AC
  Administered 2018-06-20: 2.7 mg via INTRAVENOUS
  Filled 2018-06-20: qty 2.7

## 2018-06-20 MED ORDER — HEPARIN SOD (PORK) LOCK FLUSH 100 UNIT/ML IV SOLN
500.0000 [IU] | Freq: Once | INTRAVENOUS | Status: AC
  Administered 2018-06-20: 500 [IU] via INTRAVENOUS
  Filled 2018-06-20: qty 5

## 2018-06-20 MED ORDER — PROCHLORPERAZINE MALEATE 10 MG PO TABS
10.0000 mg | ORAL_TABLET | Freq: Once | ORAL | Status: AC
  Administered 2018-06-20: 10 mg via ORAL
  Filled 2018-06-20: qty 1

## 2018-06-20 MED ORDER — SODIUM CHLORIDE 0.9 % IV SOLN
Freq: Once | INTRAVENOUS | Status: AC
  Administered 2018-06-20: 10:00:00 via INTRAVENOUS
  Filled 2018-06-20: qty 250

## 2018-06-21 ENCOUNTER — Inpatient Hospital Stay: Payer: Medicare Other

## 2018-06-21 ENCOUNTER — Other Ambulatory Visit: Payer: Self-pay

## 2018-06-21 DIAGNOSIS — J449 Chronic obstructive pulmonary disease, unspecified: Secondary | ICD-10-CM | POA: Diagnosis not present

## 2018-06-21 DIAGNOSIS — C787 Secondary malignant neoplasm of liver and intrahepatic bile duct: Secondary | ICD-10-CM | POA: Diagnosis not present

## 2018-06-21 DIAGNOSIS — C3490 Malignant neoplasm of unspecified part of unspecified bronchus or lung: Secondary | ICD-10-CM

## 2018-06-21 DIAGNOSIS — D649 Anemia, unspecified: Secondary | ICD-10-CM | POA: Diagnosis not present

## 2018-06-21 DIAGNOSIS — C3412 Malignant neoplasm of upper lobe, left bronchus or lung: Secondary | ICD-10-CM | POA: Diagnosis not present

## 2018-06-21 DIAGNOSIS — C7951 Secondary malignant neoplasm of bone: Secondary | ICD-10-CM | POA: Diagnosis not present

## 2018-06-21 DIAGNOSIS — Z5111 Encounter for antineoplastic chemotherapy: Secondary | ICD-10-CM | POA: Diagnosis not present

## 2018-06-21 MED ORDER — TOPOTECAN HCL CHEMO INJECTION 4 MG
1.3000 mg/m2 | Freq: Once | INTRAVENOUS | Status: AC
  Administered 2018-06-21: 10:00:00 2.7 mg via INTRAVENOUS
  Filled 2018-06-21: qty 2.7

## 2018-06-21 MED ORDER — SODIUM CHLORIDE 0.9% FLUSH
10.0000 mL | INTRAVENOUS | Status: DC | PRN
  Administered 2018-06-21: 09:00:00 10 mL
  Filled 2018-06-21: qty 10

## 2018-06-21 MED ORDER — PROCHLORPERAZINE MALEATE 10 MG PO TABS
10.0000 mg | ORAL_TABLET | Freq: Once | ORAL | Status: AC
  Administered 2018-06-21: 10:00:00 10 mg via ORAL
  Filled 2018-06-21: qty 1

## 2018-06-21 MED ORDER — SODIUM CHLORIDE 0.9 % IV SOLN
Freq: Once | INTRAVENOUS | Status: AC
  Administered 2018-06-21: 10:00:00 via INTRAVENOUS
  Filled 2018-06-21: qty 250

## 2018-06-21 MED ORDER — HEPARIN SOD (PORK) LOCK FLUSH 100 UNIT/ML IV SOLN
500.0000 [IU] | Freq: Once | INTRAVENOUS | Status: AC | PRN
  Administered 2018-06-21: 11:00:00 500 [IU]
  Filled 2018-06-21: qty 5

## 2018-06-22 ENCOUNTER — Inpatient Hospital Stay: Payer: Medicare Other

## 2018-06-22 ENCOUNTER — Other Ambulatory Visit: Payer: Self-pay

## 2018-06-22 VITALS — BP 113/72 | HR 99 | Temp 97.9°F | Resp 20

## 2018-06-22 DIAGNOSIS — D649 Anemia, unspecified: Secondary | ICD-10-CM | POA: Diagnosis not present

## 2018-06-22 DIAGNOSIS — Z5111 Encounter for antineoplastic chemotherapy: Secondary | ICD-10-CM | POA: Diagnosis not present

## 2018-06-22 DIAGNOSIS — C787 Secondary malignant neoplasm of liver and intrahepatic bile duct: Secondary | ICD-10-CM | POA: Diagnosis not present

## 2018-06-22 DIAGNOSIS — C3490 Malignant neoplasm of unspecified part of unspecified bronchus or lung: Secondary | ICD-10-CM

## 2018-06-22 DIAGNOSIS — C3412 Malignant neoplasm of upper lobe, left bronchus or lung: Secondary | ICD-10-CM | POA: Diagnosis not present

## 2018-06-22 DIAGNOSIS — C7951 Secondary malignant neoplasm of bone: Secondary | ICD-10-CM | POA: Diagnosis not present

## 2018-06-22 DIAGNOSIS — J449 Chronic obstructive pulmonary disease, unspecified: Secondary | ICD-10-CM | POA: Diagnosis not present

## 2018-06-22 MED ORDER — TOPOTECAN HCL CHEMO INJECTION 4 MG
1.3000 mg/m2 | Freq: Once | INTRAVENOUS | Status: AC
  Administered 2018-06-22: 10:00:00 2.7 mg via INTRAVENOUS
  Filled 2018-06-22: qty 2.7

## 2018-06-22 MED ORDER — SODIUM CHLORIDE 0.9 % IV SOLN
Freq: Once | INTRAVENOUS | Status: AC
  Administered 2018-06-22: 09:00:00 via INTRAVENOUS
  Filled 2018-06-22: qty 250

## 2018-06-22 MED ORDER — HEPARIN SOD (PORK) LOCK FLUSH 100 UNIT/ML IV SOLN
500.0000 [IU] | Freq: Once | INTRAVENOUS | Status: AC | PRN
  Administered 2018-06-22: 500 [IU]
  Filled 2018-06-22: qty 5

## 2018-06-22 MED ORDER — SODIUM CHLORIDE 0.9% FLUSH
10.0000 mL | INTRAVENOUS | Status: DC | PRN
  Administered 2018-06-22: 09:00:00 10 mL
  Filled 2018-06-22: qty 10

## 2018-06-22 MED ORDER — PROCHLORPERAZINE MALEATE 10 MG PO TABS
10.0000 mg | ORAL_TABLET | Freq: Once | ORAL | Status: AC
  Administered 2018-06-22: 09:00:00 10 mg via ORAL
  Filled 2018-06-22: qty 1

## 2018-06-25 ENCOUNTER — Telehealth: Payer: Self-pay | Admitting: Student

## 2018-06-25 NOTE — Telephone Encounter (Signed)
Palliative NP called patient's phone number x 2; unable to leave voicemail.. Was able to leave message on daughter Megan's voicemail regarding scheduled telephone visit today; awaiting return call.

## 2018-06-27 ENCOUNTER — Other Ambulatory Visit: Payer: Self-pay | Admitting: Oncology

## 2018-07-03 ENCOUNTER — Telehealth: Payer: Self-pay | Admitting: Student

## 2018-07-03 NOTE — Telephone Encounter (Signed)
Charlann Boxer, NP had a telephone follow-up Palliative visit scheduled for 06/25/18 and was unable to reach patient, she requested that I call and reschedule.  Spoke with patient and have rescheduled the follow-up visit for 07/05/18 @ 10 AM.

## 2018-07-05 ENCOUNTER — Other Ambulatory Visit: Payer: Self-pay

## 2018-07-05 ENCOUNTER — Other Ambulatory Visit: Payer: Self-pay | Admitting: Student

## 2018-07-05 DIAGNOSIS — Z515 Encounter for palliative care: Secondary | ICD-10-CM | POA: Diagnosis not present

## 2018-07-05 NOTE — Progress Notes (Signed)
[facility] Consult Note Telephone: [phone]  Fax: [phone]  PATIENT NAME: Grace [name] DOB: [birth year] MRN: [id number]  PRIMARY CARE PROVIDER:   [name], MD  REFERRING PROVIDER:  [name], MD [address] [facility] [place], [place] [address]  RESPONSIBLE PARTY: Daughter, Grace [name]   ASSESSMENT: Due to the COVID-19 crisis, this telephone evaluation and treatment contact was done via telephone and it was initiated and consent by this patient and or family. Palliative NP made several attempts to reach patient by phone. She did answer late afternoon today; she states she had been asleep earlier. We discussed role of Palliative Medicine.  We discussed ongoing goals of care and symptom management. Palliative Medicine will continue to provide support and make recommendations. No discussion of Hospice today; patient expresses being very hopeful of continuing her current treatment regimen.      RECOMMENDATIONS and PLAN:  1. Medical goals of therapy: Grace [name] will continue chemotherapy as ordered. Palliative Medicine will monitor for changes and make recommendations as needed.  2. Symptom management: Dyspnea: Continue Advair as directed, albuterol 2 puffs every 6 hours prn, duoneb 41ml every 4 hours prn. Pain: MS contin 30mg  every 8 hours as directed.   Palliative Medicine will follow up in 8 weeks or sooner, if needed.   I spent 15 minutes providing this consultation,  from 3:00pm to 3:15pm. More than 50% of the time in this consultation was spent coordinating communication.   HISTORY OF PRESENT ILLNESS:  Grace [name] is a 68 y.o.  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  Palliative Care was asked to help address goals of care; she is seen for follow up visit  today. Grace [name] states she has been "doing fairly well." She is currently receiving topotecan; her next treatment is in two weeks and repeat scan in 3 months. She states her pain has been managed with the MS Contin. She has dyspnea with exertion; denies shortness of breath at rest. She has a walker, but states she uses wheel chair if she has to go long distances. She also uses bedside commode. Since her last treatment, she reports not having much of an appetite, but is still eating. She denies any weight loss. She had an episode of nausea last week; takes phenergan or compazine prn. She is sleeping okay. She denies any needs at this time.   CODE STATUS: DNR  PPS:  HOSPICE ELIGIBILITY/DIAGNOSIS: TBD  PAST MEDICAL HISTORY:  Past Medical History:  Diagnosis Date  . Anxiety   . Arthritis   . Asthma    COPD  . CAP (community acquired pneumonia) 06/30/2014  . Cardiomyopathy ([place])    nonischemic (EF 35-40%)  . CHF (congestive heart failure) ([place])   . COPD (chronic obstructive pulmonary disease) ([place])   . Depression   . Dyspnea   . GERD (gastroesophageal reflux disease)   . Hyperlipidemia   . Hypertension   . Pneumonia   . PVC's (premature ventricular contractions)    states 10 years ago  . Sepsis ([place]) 06/30/2014  . Small cell lung cancer ([place]) 12/2016   Chemo tx's.  . SOB (shortness of breath) 05/08/2014  . Spinal cord injury at T7-T12 level [facility])    2016    SOCIAL HX:  Social History   Tobacco Use  . Smoking status: Former Smoker  Packs/day: 0.50    Years: 50.00    Pack years: 25.00    Types: Cigarettes    Last attempt to quit: 01/27/2015    Years since quitting: 3.4  . Smokeless tobacco: Never Used  Substance Use Topics  . Alcohol use: No    ALLERGIES:  Allergies  Allergen Reactions  . Fish Allergy Anaphylaxis    Per pt after eating [facility] she had throat swelling and SOB. MSY   . Fentanyl     Overly sedated and groggy, ineffective      PERTINENT  MEDICATIONS:  Outpatient Encounter Medications as of 07/05/2018  Medication Sig  . ADVAIR DISKUS 250-50 MCG/DOSE AEPB Inhale 1 puff into the lungs 2 (two) times daily.  Marland Kitchen albuterol (PROAIR HFA) 108 (90 Base) MCG/ACT inhaler Inhale 2 puffs into the lungs every 6 (six) hours as needed for wheezing or shortness of breath.   Marland Kitchen alendronate (FOSAMAX) 70 MG tablet Take 70 mg by mouth every Thursday.   . ARIPiprazole (ABILIFY) 5 MG tablet Take 5 mg by mouth daily.  Marland Kitchen BELBUCA 150 MCG FILM Take 1 tablet by mouth daily.  . benzonatate (TESSALON) 100 MG capsule Take 1 capsule (100 mg total) by mouth every 8 (eight) hours as needed for cough. (Patient not taking: Reported on 05/21/2018)  . dextromethorphan-guaiFENesin (MUCINEX DM) 30-600 MG 12hr tablet Take 1 tablet by mouth 2 (two) times daily as needed for cough.  . diphenoxylate-atropine (LOMOTIL) 2.5-0.025 MG tablet Take 1 tablet by mouth 4 (four) times daily as needed for diarrhea or loose stools.  . DULoxetine (CYMBALTA) 60 MG capsule Take 60 mg by mouth daily.  Marland Kitchen ipratropium-albuterol (DUONEB) 0.5-2.5 (3) MG/3ML SOLN Take 3 mLs by nebulization every 4 (four) hours as needed.  Marland Kitchen LORazepam (ATIVAN) 0.5 MG tablet Take 0.5 mg by mouth as needed.  . metoprolol succinate (TOPROL-XL) 100 MG 24 hr tablet Take 1 tablet by mouth 1 day or 1 dose.  . morphine (MS CONTIN) 30 MG 12 hr tablet Take 1 tablet (30 mg total) by mouth every 8 (eight) hours.  . pantoprazole (PROTONIX) 40 MG tablet Take 1 tablet by mouth daily.  . potassium chloride SA (K-DUR,KLOR-CON) 20 MEQ tablet Take 1 tablet (20 mEq total) by mouth daily.  . prochlorperazine (COMPAZINE) 10 MG tablet Take 1 tablet (10 mg total) by mouth every 6 (six) hours as needed (Nausea or vomiting).  . promethazine (PHENERGAN) 25 MG tablet Take 1 tablet (25 mg total) by mouth every 8 (eight) hours as needed for nausea or vomiting.  . sacubitril-valsartan (ENTRESTO) 24-26 MG Take 1 tablet by mouth 2 (two) times daily.   Marland Kitchen tiZANidine (ZANAFLEX) 4 MG tablet Take 1 tablet (4 mg total) by mouth every 8 (eight) hours as needed for muscle spasms. (Patient not taking: Reported on 05/21/2018)   Facility-Administered Encounter Medications as of 07/05/2018  Medication  . sodium chloride flush (NS) 0.9 % injection 10 mL    PHYSICAL EXAM:   Physical exam deferred.   Ezekiel Slocumb, NP

## 2018-07-10 ENCOUNTER — Other Ambulatory Visit: Payer: Self-pay | Admitting: *Deleted

## 2018-07-10 MED ORDER — MORPHINE SULFATE ER 30 MG PO TBCR: 30.0000 mg | EXTENDED_RELEASE_TABLET | Freq: Three times a day (TID) | ORAL | 0 refills | Status: DC

## 2018-07-13 NOTE — Progress Notes (Signed)
[facility]  [phone] [phone]  ID: [name] OB: [birth year]  MR#: [id number]  LGX#:[id number]  Patient Care Team: [name], MD as PCP - General (Family Medicine) [name], RN as Registered Nurse  CHIEF COMPLAINT: Stage IV small cell lung cancer with liver and bone metastasis.  INTERVAL HISTORY: Patient returns to clinic today for further evaluation and consideration of cycle 8 of  topotecan.  She continues to have intermittent, but persistent nausea.  Her pain is well controlled on her current dose of MS Contin.  She has chronic weakness and fatigue, but otherwise is tolerating her treatments well.  She has no neurologic complaints.  She denies any recent fevers or illnesses.  She has a good appetite and denies weight loss.  She has chronic shortness of breath, but denies any chest pain, cough, or hemoptysis. She has no abdominal pain.  She denies any melena or hematochezia.  She has no urinary complaints.  Patient offers no further specific complaints today.  REVIEW OF SYSTEMS:   Review of Systems  Constitutional: Positive for malaise/fatigue. Negative for fever and weight loss.  Respiratory: Positive for shortness of breath. Negative for cough, hemoptysis and wheezing.   Cardiovascular: Negative.  Negative for chest pain and leg swelling.  Gastrointestinal: Positive for nausea. Negative for abdominal pain, blood in stool, diarrhea, melena and vomiting.  Genitourinary: Negative.  Negative for dysuria and frequency.  Musculoskeletal: Positive for back pain, joint pain and neck pain.  Skin: Negative.  Negative for rash.  Neurological: Positive for weakness. Negative for sensory change, focal weakness and headaches.  Psychiatric/Behavioral: Negative.  Negative for memory loss. The patient is not nervous/anxious.     As per HPI. Otherwise, a complete review of systems is negative.  PAST MEDICAL HISTORY: Past Medical History:   Diagnosis Date  . Anxiety   . Arthritis   . Asthma    COPD  . CAP (community acquired pneumonia) 06/30/2014  . Cardiomyopathy ([place])    nonischemic (EF 35-40%)  . CHF (congestive heart failure) ([place])   . COPD (chronic obstructive pulmonary disease) ([place])   . Depression   . Dyspnea   . GERD (gastroesophageal reflux disease)   . Hyperlipidemia   . Hypertension   . Pneumonia   . PVC's (premature ventricular contractions)    states 10 years ago  . Sepsis ([place]) 06/30/2014  . Small cell lung cancer ([place]) 12/2016   Chemo tx's.  . SOB (shortness of breath) 05/08/2014  . Spinal cord injury at T7-T12 level [facility])    2016    PAST SURGICAL HISTORY: Past Surgical History:  Procedure Laterality Date  . APPENDECTOMY    . CARDIAC CATHETERIZATION    . CATARACT EXTRACTION     right eye   . CESAREAN SECTION    . COLONOSCOPY    . LAMINOTOMY    . LUMBAR LAMINECTOMY/DECOMPRESSION MICRODISCECTOMY Left 08/01/2016   Procedure: Left Lumbar Four-Five Laminectomy and Foraminotomy;  Surgeon: Earnie Larsson, MD;  Location: [facility];  Service: Neurosurgery;  Laterality: Left;  . PORTA CATH INSERTION N/A 02/03/2017   Procedure: PORTA CATH INSERTION;  Surgeon: Katha Cabal, MD;  Location: [facility];  Service: Cardiovascular;  Laterality: N/A;    FAMILY HISTORY: Family History  Problem Relation Age of Onset  . Heart attack Father 42       MI x 3     ADVANCED DIRECTIVES (Y/N):  N  HEALTH MAINTENANCE: Social History   Tobacco Use  .  Smoking status: Former Smoker    Packs/day: 0.50    Years: 50.00    Pack years: 25.00    Types: Cigarettes    Last attempt to quit: 01/27/2015    Years since quitting: 3.4  . Smokeless tobacco: Never Used  Substance Use Topics  . Alcohol use: No  . Drug use: No     Colonoscopy:  PAP:  Bone density:  Lipid panel:  Allergies  Allergen Reactions  . Fish Allergy Anaphylaxis    Per pt after eating [facility] she had throat swelling and SOB. MSY    . Fentanyl     Overly sedated and groggy, ineffective     Current Outpatient Medications  Medication Sig Dispense Refill  . ADVAIR DISKUS 250-50 MCG/DOSE AEPB Inhale 1 puff into the lungs 2 (two) times daily.    Marland Kitchen albuterol (PROAIR HFA) 108 (90 Base) MCG/ACT inhaler Inhale 2 puffs into the lungs every 6 (six) hours as needed for wheezing or shortness of breath.     Marland Kitchen alendronate (FOSAMAX) 70 MG tablet Take 70 mg by mouth every Thursday.     . ARIPiprazole (ABILIFY) 5 MG tablet Take 5 mg by mouth daily.    Marland Kitchen BELBUCA 150 MCG FILM Take 1 tablet by mouth daily.  0  . benzonatate (TESSALON) 100 MG capsule Take 1 capsule (100 mg total) by mouth every 8 (eight) hours as needed for cough. 20 capsule 0  . dextromethorphan-guaiFENesin (MUCINEX DM) 30-600 MG 12hr tablet Take 1 tablet by mouth 2 (two) times daily as needed for cough.    . diphenoxylate-atropine (LOMOTIL) 2.5-0.025 MG tablet Take 1 tablet by mouth 4 (four) times daily as needed for diarrhea or loose stools. 30 tablet 0  . DULoxetine (CYMBALTA) 60 MG capsule Take 60 mg by mouth daily.    Marland Kitchen ipratropium-albuterol (DUONEB) 0.5-2.5 (3) MG/3ML SOLN Take 3 mLs by nebulization every 4 (four) hours as needed. 360 mL 1  . LORazepam (ATIVAN) 0.5 MG tablet Take 0.5 mg by mouth as needed.    . metoprolol succinate (TOPROL-XL) 100 MG 24 hr tablet Take 1 tablet by mouth 1 day or 1 dose.    . morphine (MS CONTIN) 30 MG 12 hr tablet Take 1 tablet (30 mg total) by mouth every 8 (eight) hours. 90 tablet 0  . pantoprazole (PROTONIX) 40 MG tablet Take 1 tablet by mouth daily.    . potassium chloride SA (K-DUR,KLOR-CON) 20 MEQ tablet Take 1 tablet (20 mEq total) by mouth daily. 30 tablet 2  . prochlorperazine (COMPAZINE) 10 MG tablet Take 1 tablet (10 mg total) by mouth every 6 (six) hours as needed (Nausea or vomiting). 60 tablet 2  . promethazine (PHENERGAN) 25 MG tablet Take 1 tablet (25 mg total) by mouth every 8 (eight) hours as needed for nausea or  vomiting. 30 tablet 2  . sacubitril-valsartan (ENTRESTO) 24-26 MG Take 1 tablet by mouth 2 (two) times daily. 180 tablet 3  . tiZANidine (ZANAFLEX) 4 MG tablet Take 1 tablet (4 mg total) by mouth every 8 (eight) hours as needed for muscle spasms. (Patient not taking: Reported on 05/21/2018) 90 tablet 2   No current facility-administered medications for this visit.    Facility-Administered Medications Ordered in Other Visits  Medication Dose Route Frequency Provider Last Rate Last Dose  . sodium chloride flush (NS) 0.9 % injection 10 mL  10 mL Intravenous PRN Lloyd Huger, MD   10 mL at 03/01/17 0841    OBJECTIVE: Vitals:  07/16/18 0923  BP: (!) 89/60  Pulse: 70  Temp: 97.9 F (36.6 C)     There is no height or weight on file to calculate BMI.    ECOG FS:2 - Symptomatic, <50% confined to bed  General: Well-developed, well-nourished, no acute distress.  Sitting in a wheelchair. Eyes: Pink conjunctiva, anicteric sclera. HEENT: Normocephalic, moist mucous membranes. Lungs: Clear to auscultation bilaterally. Heart: Regular rate and rhythm. No rubs, murmurs, or gallops. Abdomen: Soft, nontender, nondistended. No organomegaly noted, normoactive bowel sounds. Musculoskeletal: No edema, cyanosis, or clubbing. Neuro: Alert, answering all questions appropriately. Cranial nerves grossly intact. Skin: No rashes or petechiae noted. Psych: Normal affect.  LAB RESULTS:  Lab Results  Component Value Date   NA 134 (L) 07/16/2018   K 3.1 (L) 07/16/2018   CL 104 07/16/2018   CO2 24 07/16/2018   GLUCOSE 158 (H) 07/16/2018   BUN 14 07/16/2018   CREATININE 0.96 07/16/2018   CALCIUM 7.8 (L) 07/16/2018   PROT 8.0 07/16/2018   ALBUMIN 3.1 (L) 07/16/2018   AST 28 07/16/2018   ALT 26 07/16/2018   ALKPHOS 76 07/16/2018   BILITOT 0.3 07/16/2018   GFRNONAA >60 07/16/2018   GFRAA >60 07/16/2018    Lab Results  Component Value Date   WBC 5.7 07/16/2018   NEUTROABS 3.6 07/16/2018   HGB  8.2 (L) 07/16/2018   HCT 27.5 (L) 07/16/2018   MCV 95.8 07/16/2018   PLT 281 07/16/2018     STUDIES: No results found.  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  PLAN:  1. 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  Patient then received maintenance Tecentriq from Jun 21, 2017 through November 15, 2017.  CT scan results from December 25, 2017 reviewed independently with progressive disease in her mediastinum as well as significant progression of multifocal hepatic disease.  Hospice was briefly discussed, but patient wished to continue aggressive treatment.  Patient was initially scheduled to get topotecan on days 1 through 5 every 21 days, but given her persistent thrombocytopenia she can only receive treatment every 28 days.  Patient's most recent imaging on Jun 15, 2018 reviewed independently and reported as above with improvement in patient's known metastatic disease in her liver.  Proceed with cycle 8, day 1 of dose reduced Toprol taken today.  Return to clinic Tuesday through Friday for treatment only.  Patient will then return to clinic in 1 month for further evaluation and consideration of cycle 9.     2. Pain: Continue MS Contin to 30 mg every 8 hours. Patient states she has "fired" her pain doctor.  3. Thrombocytopenia: Resolved.  Proceed with treatment every 28 days as above. 4.  Anemia: Patient's hemoglobin continues to slowly trend down and is now 8.2 today.  Monitor. 5.  Neutropenia: Resolved.   6.  COPD: Chronic and unchanged.  Continue outpatient palliative care.  Continue current medications as prescribed. 7.  Hypokalemia: Chronic and unchanged.  Patient has reinitiated her oral supplementation.  She will also receive 40 mEq of IV potassium later this week.   8.  Disposition: Hospice and end-of-life  care were previously discussed, but patient wishes to continue aggressive treatments.  She expressed understanding that her treatment options are limited.  She confirms that she is a DNR and is having outpatient palliative care visit.  I spent a total of 30 minutes face-to-face with the patient of which greater than 50% of the visit was spent in counseling and coordination of care as detailed above.   Patient expressed understanding and was in agreement with this plan. She also understands that She can call clinic at any time with any questions, concerns, or complaints.   Cancer Staging Small cell lung cancer [facility]) Staging form: Lung, AJCC 8th Edition - Clinical stage from 01/28/2017: Stage IV (cT4, cN3, pM1c) - Signed by Lloyd Huger, MD on 01/28/2017   Lloyd Huger, MD   07/16/2018 9:40 AM

## 2018-07-16 ENCOUNTER — Inpatient Hospital Stay (HOSPITAL_BASED_OUTPATIENT_CLINIC_OR_DEPARTMENT_OTHER): Payer: Medicare Other | Admitting: Oncology

## 2018-07-16 ENCOUNTER — Inpatient Hospital Stay: Payer: Medicare Other | Attending: Oncology

## 2018-07-16 ENCOUNTER — Other Ambulatory Visit: Payer: Self-pay

## 2018-07-16 ENCOUNTER — Encounter: Payer: Self-pay | Admitting: Oncology

## 2018-07-16 ENCOUNTER — Inpatient Hospital Stay: Payer: Medicare Other

## 2018-07-16 VITALS — BP 89/60 | HR 70 | Temp 97.9°F

## 2018-07-16 VITALS — BP 88/58 | HR 80

## 2018-07-16 DIAGNOSIS — Z5111 Encounter for antineoplastic chemotherapy: Secondary | ICD-10-CM

## 2018-07-16 DIAGNOSIS — R110 Nausea: Secondary | ICD-10-CM | POA: Insufficient documentation

## 2018-07-16 DIAGNOSIS — J449 Chronic obstructive pulmonary disease, unspecified: Secondary | ICD-10-CM

## 2018-07-16 DIAGNOSIS — C7951 Secondary malignant neoplasm of bone: Secondary | ICD-10-CM

## 2018-07-16 DIAGNOSIS — E785 Hyperlipidemia, unspecified: Secondary | ICD-10-CM | POA: Insufficient documentation

## 2018-07-16 DIAGNOSIS — Z87891 Personal history of nicotine dependence: Secondary | ICD-10-CM | POA: Insufficient documentation

## 2018-07-16 DIAGNOSIS — I509 Heart failure, unspecified: Secondary | ICD-10-CM | POA: Insufficient documentation

## 2018-07-16 DIAGNOSIS — K219 Gastro-esophageal reflux disease without esophagitis: Secondary | ICD-10-CM

## 2018-07-16 DIAGNOSIS — C787 Secondary malignant neoplasm of liver and intrahepatic bile duct: Secondary | ICD-10-CM

## 2018-07-16 DIAGNOSIS — E876 Hypokalemia: Secondary | ICD-10-CM | POA: Diagnosis not present

## 2018-07-16 DIAGNOSIS — I110 Hypertensive heart disease with heart failure: Secondary | ICD-10-CM

## 2018-07-16 DIAGNOSIS — R531 Weakness: Secondary | ICD-10-CM | POA: Insufficient documentation

## 2018-07-16 DIAGNOSIS — M129 Arthropathy, unspecified: Secondary | ICD-10-CM | POA: Diagnosis not present

## 2018-07-16 DIAGNOSIS — C3490 Malignant neoplasm of unspecified part of unspecified bronchus or lung: Secondary | ICD-10-CM | POA: Diagnosis not present

## 2018-07-16 DIAGNOSIS — I429 Cardiomyopathy, unspecified: Secondary | ICD-10-CM

## 2018-07-16 DIAGNOSIS — D649 Anemia, unspecified: Secondary | ICD-10-CM | POA: Insufficient documentation

## 2018-07-16 DIAGNOSIS — F329 Major depressive disorder, single episode, unspecified: Secondary | ICD-10-CM | POA: Insufficient documentation

## 2018-07-16 DIAGNOSIS — R5383 Other fatigue: Secondary | ICD-10-CM | POA: Insufficient documentation

## 2018-07-16 DIAGNOSIS — Z79899 Other long term (current) drug therapy: Secondary | ICD-10-CM | POA: Diagnosis not present

## 2018-07-16 DIAGNOSIS — Z95828 Presence of other vascular implants and grafts: Secondary | ICD-10-CM

## 2018-07-16 LAB — COMPREHENSIVE METABOLIC PANEL
ALT: 26 U/L (ref 0–44)
AST: 28 U/L (ref 15–41)
Albumin: 3.1 g/dL — ABNORMAL LOW (ref 3.5–5.0)
Alkaline Phosphatase: 76 U/L (ref 38–126)
Anion gap: 6 (ref 5–15)
BUN: 14 mg/dL (ref 8–23)
CO2: 24 mmol/L (ref 22–32)
Calcium: 7.8 mg/dL — ABNORMAL LOW (ref 8.9–10.3)
Chloride: 104 mmol/L (ref 98–111)
Creatinine, Ser: 0.96 mg/dL (ref 0.44–1.00)
GFR calc Af Amer: >60 mL/min (ref >60)
GFR calc non Af Amer: >60 mL/min (ref >60)
Glucose, Bld: 158 mg/dL — ABNORMAL HIGH (ref 70–99)
Potassium: 3.1 mmol/L — ABNORMAL LOW (ref 3.5–5.1)
Sodium: 134 mmol/L — ABNORMAL LOW (ref 135–145)
Total Bilirubin: 0.3 mg/dL (ref 0.3–1.2)
Total Protein: 8 g/dL (ref 6.5–8.1)

## 2018-07-16 LAB — CBC WITH DIFFERENTIAL/PLATELET
Abs Immature Granulocytes: 0.02 10*3/uL (ref 0.00–0.07)
Basophils Absolute: 0 10*3/uL (ref 0.0–0.1)
Basophils Relative: 1 %
Eosinophils Absolute: 0 10*3/uL (ref 0.0–0.5)
Eosinophils Relative: 0 %
HCT: 27.5 % — ABNORMAL LOW (ref 36.0–46.0)
Hemoglobin: 8.2 g/dL — ABNORMAL LOW (ref 12.0–15.0)
Immature Granulocytes: 0 %
Lymphocytes Relative: 26 %
Lymphs Abs: 1.5 10*3/uL (ref 0.7–4.0)
MCH: 28.6 pg (ref 26.0–34.0)
MCHC: 29.8 g/dL — ABNORMAL LOW (ref 30.0–36.0)
MCV: 95.8 fL (ref 80.0–100.0)
Monocytes Absolute: 0.6 10*3/uL (ref 0.1–1.0)
Monocytes Relative: 11 %
Neutro Abs: 3.6 10*3/uL (ref 1.7–7.7)
Neutrophils Relative %: 62 %
Platelets: 281 10*3/uL (ref 150–400)
RBC: 2.87 MIL/uL — ABNORMAL LOW (ref 3.87–5.11)
RDW: 18 % — ABNORMAL HIGH (ref 11.5–15.5)
WBC: 5.7 10*3/uL (ref 4.0–10.5)
nRBC: 0 % (ref 0.0–0.2)

## 2018-07-16 LAB — SAMPLE TO BLOOD BANK

## 2018-07-16 MED ORDER — TOPOTECAN HCL CHEMO INJECTION 4 MG
1.3000 mg/m2 | Freq: Once | INTRAVENOUS | Status: AC
  Administered 2018-07-16: 11:00:00 2.7 mg via INTRAVENOUS
  Filled 2018-07-16: qty 2.7

## 2018-07-16 MED ORDER — HEPARIN SOD (PORK) LOCK FLUSH 100 UNIT/ML IV SOLN
500.0000 [IU] | Freq: Once | INTRAVENOUS | Status: AC | PRN
  Administered 2018-07-16: 500 [IU]
  Filled 2018-07-16: qty 5

## 2018-07-16 MED ORDER — SODIUM CHLORIDE 0.9% FLUSH
10.0000 mL | Freq: Once | INTRAVENOUS | Status: AC
  Administered 2018-07-16: 10 mL via INTRAVENOUS
  Filled 2018-07-16: qty 10

## 2018-07-16 MED ORDER — SODIUM CHLORIDE 0.9 % IV SOLN
Freq: Once | INTRAVENOUS | Status: AC
  Administered 2018-07-16: 10:00:00 via INTRAVENOUS
  Filled 2018-07-16: qty 250

## 2018-07-16 MED ORDER — PROCHLORPERAZINE MALEATE 10 MG PO TABS
10.0000 mg | ORAL_TABLET | Freq: Once | ORAL | Status: AC
  Administered 2018-07-16: 10 mg via ORAL
  Filled 2018-07-16: qty 1

## 2018-07-16 NOTE — Progress Notes (Signed)
BP 88/58 when rechecked in infusion. Per Dr Grayland Ormond okay to proceed with treatment today

## 2018-07-16 NOTE — Progress Notes (Signed)
Patient stated that she had been doing well with no complaints. 

## 2018-07-17 ENCOUNTER — Other Ambulatory Visit: Payer: Self-pay

## 2018-07-17 ENCOUNTER — Inpatient Hospital Stay: Payer: Medicare Other

## 2018-07-17 VITALS — BP 104/66 | HR 73 | Temp 97.3°F | Resp 18

## 2018-07-17 DIAGNOSIS — C787 Secondary malignant neoplasm of liver and intrahepatic bile duct: Secondary | ICD-10-CM | POA: Diagnosis not present

## 2018-07-17 DIAGNOSIS — D649 Anemia, unspecified: Secondary | ICD-10-CM | POA: Diagnosis not present

## 2018-07-17 DIAGNOSIS — E876 Hypokalemia: Secondary | ICD-10-CM | POA: Diagnosis not present

## 2018-07-17 DIAGNOSIS — C3490 Malignant neoplasm of unspecified part of unspecified bronchus or lung: Secondary | ICD-10-CM

## 2018-07-17 DIAGNOSIS — Z5111 Encounter for antineoplastic chemotherapy: Secondary | ICD-10-CM | POA: Diagnosis not present

## 2018-07-17 DIAGNOSIS — C7951 Secondary malignant neoplasm of bone: Secondary | ICD-10-CM | POA: Diagnosis not present

## 2018-07-17 MED ORDER — PROCHLORPERAZINE MALEATE 10 MG PO TABS
10.0000 mg | ORAL_TABLET | Freq: Once | ORAL | Status: AC
  Administered 2018-07-17: 10 mg via ORAL
  Filled 2018-07-17: qty 1

## 2018-07-17 MED ORDER — HEPARIN SOD (PORK) LOCK FLUSH 100 UNIT/ML IV SOLN
500.0000 [IU] | Freq: Once | INTRAVENOUS | Status: AC | PRN
  Administered 2018-07-17: 500 [IU]
  Filled 2018-07-17: qty 5

## 2018-07-17 MED ORDER — TOPOTECAN HCL CHEMO INJECTION 4 MG
1.3000 mg/m2 | Freq: Once | INTRAVENOUS | Status: AC
  Administered 2018-07-17: 2.7 mg via INTRAVENOUS
  Filled 2018-07-17: qty 2.7

## 2018-07-17 MED ORDER — SODIUM CHLORIDE 0.9 % IV SOLN
Freq: Once | INTRAVENOUS | Status: AC
  Administered 2018-07-17: 09:00:00 via INTRAVENOUS
  Filled 2018-07-17: qty 250

## 2018-07-18 ENCOUNTER — Other Ambulatory Visit: Payer: Self-pay

## 2018-07-18 ENCOUNTER — Inpatient Hospital Stay: Payer: Medicare Other

## 2018-07-18 VITALS — BP 104/70 | HR 80 | Temp 98.2°F | Resp 20

## 2018-07-18 DIAGNOSIS — C3490 Malignant neoplasm of unspecified part of unspecified bronchus or lung: Secondary | ICD-10-CM | POA: Diagnosis not present

## 2018-07-18 DIAGNOSIS — E876 Hypokalemia: Secondary | ICD-10-CM | POA: Diagnosis not present

## 2018-07-18 DIAGNOSIS — C787 Secondary malignant neoplasm of liver and intrahepatic bile duct: Secondary | ICD-10-CM | POA: Diagnosis not present

## 2018-07-18 DIAGNOSIS — D649 Anemia, unspecified: Secondary | ICD-10-CM | POA: Diagnosis not present

## 2018-07-18 DIAGNOSIS — C7951 Secondary malignant neoplasm of bone: Secondary | ICD-10-CM | POA: Diagnosis not present

## 2018-07-18 DIAGNOSIS — Z5111 Encounter for antineoplastic chemotherapy: Secondary | ICD-10-CM | POA: Diagnosis not present

## 2018-07-18 MED ORDER — SODIUM CHLORIDE 0.9 % IV SOLN
Freq: Once | INTRAVENOUS | Status: AC
  Administered 2018-07-18: 09:00:00 via INTRAVENOUS
  Filled 2018-07-18: qty 250

## 2018-07-18 MED ORDER — SODIUM CHLORIDE 0.9% FLUSH
10.0000 mL | INTRAVENOUS | Status: DC | PRN
  Administered 2018-07-18: 09:00:00 10 mL
  Filled 2018-07-18: qty 10

## 2018-07-18 MED ORDER — TOPOTECAN HCL CHEMO INJECTION 4 MG
1.3000 mg/m2 | Freq: Once | INTRAVENOUS | Status: AC
  Administered 2018-07-18: 2.7 mg via INTRAVENOUS
  Filled 2018-07-18: qty 2.7

## 2018-07-18 MED ORDER — HEPARIN SOD (PORK) LOCK FLUSH 100 UNIT/ML IV SOLN
500.0000 [IU] | Freq: Once | INTRAVENOUS | Status: AC | PRN
  Administered 2018-07-18: 11:00:00 500 [IU]
  Filled 2018-07-18 (×2): qty 5

## 2018-07-18 MED ORDER — PROCHLORPERAZINE MALEATE 10 MG PO TABS
10.0000 mg | ORAL_TABLET | Freq: Once | ORAL | Status: AC
  Administered 2018-07-18: 10 mg via ORAL
  Filled 2018-07-18: qty 1

## 2018-07-19 ENCOUNTER — Other Ambulatory Visit: Payer: Self-pay

## 2018-07-19 ENCOUNTER — Inpatient Hospital Stay: Payer: Medicare Other

## 2018-07-20 ENCOUNTER — Inpatient Hospital Stay: Payer: Medicare Other

## 2018-07-20 ENCOUNTER — Ambulatory Visit: Payer: Medicare Other

## 2018-07-20 ENCOUNTER — Other Ambulatory Visit: Payer: Self-pay

## 2018-07-20 VITALS — BP 120/82 | HR 96 | Temp 98.0°F | Resp 19

## 2018-07-20 DIAGNOSIS — C3490 Malignant neoplasm of unspecified part of unspecified bronchus or lung: Secondary | ICD-10-CM

## 2018-07-20 DIAGNOSIS — Z5111 Encounter for antineoplastic chemotherapy: Secondary | ICD-10-CM | POA: Diagnosis not present

## 2018-07-20 DIAGNOSIS — D649 Anemia, unspecified: Secondary | ICD-10-CM | POA: Diagnosis not present

## 2018-07-20 DIAGNOSIS — C787 Secondary malignant neoplasm of liver and intrahepatic bile duct: Secondary | ICD-10-CM | POA: Diagnosis not present

## 2018-07-20 DIAGNOSIS — C7951 Secondary malignant neoplasm of bone: Secondary | ICD-10-CM | POA: Diagnosis not present

## 2018-07-20 DIAGNOSIS — E876 Hypokalemia: Secondary | ICD-10-CM | POA: Diagnosis not present

## 2018-07-20 MED ORDER — PROCHLORPERAZINE MALEATE 10 MG PO TABS
10.0000 mg | ORAL_TABLET | Freq: Once | ORAL | Status: AC
  Administered 2018-07-20: 10 mg via ORAL
  Filled 2018-07-20: qty 1

## 2018-07-20 MED ORDER — SODIUM CHLORIDE 0.9 % IV SOLN
Freq: Once | INTRAVENOUS | Status: AC
  Administered 2018-07-20: 09:00:00 via INTRAVENOUS
  Filled 2018-07-20: qty 250

## 2018-07-20 MED ORDER — HEPARIN SOD (PORK) LOCK FLUSH 100 UNIT/ML IV SOLN
500.0000 [IU] | Freq: Once | INTRAVENOUS | Status: AC | PRN
  Administered 2018-07-20: 11:00:00 500 [IU]
  Filled 2018-07-20: qty 5

## 2018-07-20 MED ORDER — TOPOTECAN HCL CHEMO INJECTION 4 MG
1.3000 mg/m2 | Freq: Once | INTRAVENOUS | Status: AC
  Administered 2018-07-20: 10:00:00 2.7 mg via INTRAVENOUS
  Filled 2018-07-20: qty 2.7

## 2018-07-23 ENCOUNTER — Inpatient Hospital Stay: Payer: Medicare Other

## 2018-07-23 ENCOUNTER — Other Ambulatory Visit: Payer: Self-pay

## 2018-07-23 DIAGNOSIS — C787 Secondary malignant neoplasm of liver and intrahepatic bile duct: Secondary | ICD-10-CM | POA: Diagnosis not present

## 2018-07-23 DIAGNOSIS — C3490 Malignant neoplasm of unspecified part of unspecified bronchus or lung: Secondary | ICD-10-CM

## 2018-07-23 DIAGNOSIS — D649 Anemia, unspecified: Secondary | ICD-10-CM | POA: Diagnosis not present

## 2018-07-23 DIAGNOSIS — E876 Hypokalemia: Secondary | ICD-10-CM | POA: Diagnosis not present

## 2018-07-23 DIAGNOSIS — C7951 Secondary malignant neoplasm of bone: Secondary | ICD-10-CM | POA: Diagnosis not present

## 2018-07-23 DIAGNOSIS — Z5111 Encounter for antineoplastic chemotherapy: Secondary | ICD-10-CM | POA: Diagnosis not present

## 2018-07-23 LAB — CBC WITH DIFFERENTIAL/PLATELET
Abs Immature Granulocytes: 0.06 10*3/uL (ref 0.00–0.07)
Basophils Absolute: 0 10*3/uL (ref 0.0–0.1)
Basophils Relative: 2 %
Eosinophils Absolute: 0 10*3/uL (ref 0.0–0.5)
Eosinophils Relative: 1 %
HCT: 27.2 % — ABNORMAL LOW (ref 36.0–46.0)
Hemoglobin: 8.2 g/dL — ABNORMAL LOW (ref 12.0–15.0)
Immature Granulocytes: 3 %
Lymphocytes Relative: 57 %
Lymphs Abs: 1 10*3/uL (ref 0.7–4.0)
MCH: 27.9 pg (ref 26.0–34.0)
MCHC: 30.1 g/dL (ref 30.0–36.0)
MCV: 92.5 fL (ref 80.0–100.0)
Monocytes Absolute: 0.1 10*3/uL (ref 0.1–1.0)
Monocytes Relative: 4 %
Neutro Abs: 0.6 10*3/uL — ABNORMAL LOW (ref 1.7–7.7)
Neutrophils Relative %: 33 %
Platelets: 157 10*3/uL (ref 150–400)
RBC: 2.94 MIL/uL — ABNORMAL LOW (ref 3.87–5.11)
RDW: 17.4 % — ABNORMAL HIGH (ref 11.5–15.5)
Smear Review: NORMAL
WBC: 1.8 10*3/uL — ABNORMAL LOW (ref 4.0–10.5)
nRBC: 0 % (ref 0.0–0.2)

## 2018-07-23 LAB — COMPREHENSIVE METABOLIC PANEL
ALT: 28 U/L (ref 0–44)
AST: 29 U/L (ref 15–41)
Albumin: 3.3 g/dL — ABNORMAL LOW (ref 3.5–5.0)
Alkaline Phosphatase: 74 U/L (ref 38–126)
Anion gap: 8 (ref 5–15)
BUN: 14 mg/dL (ref 8–23)
CO2: 25 mmol/L (ref 22–32)
Calcium: 8.4 mg/dL — ABNORMAL LOW (ref 8.9–10.3)
Chloride: 101 mmol/L (ref 98–111)
Creatinine, Ser: 0.84 mg/dL (ref 0.44–1.00)
GFR calc Af Amer: >60 mL/min (ref >60)
GFR calc non Af Amer: >60 mL/min (ref >60)
Glucose, Bld: 112 mg/dL — ABNORMAL HIGH (ref 70–99)
Potassium: 3.8 mmol/L (ref 3.5–5.1)
Sodium: 134 mmol/L — ABNORMAL LOW (ref 135–145)
Total Bilirubin: 0.7 mg/dL (ref 0.3–1.2)
Total Protein: 8.3 g/dL — ABNORMAL HIGH (ref 6.5–8.1)

## 2018-07-23 LAB — SAMPLE TO BLOOD BANK

## 2018-07-23 MED ORDER — HEPARIN SOD (PORK) LOCK FLUSH 100 UNIT/ML IV SOLN
500.0000 [IU] | Freq: Once | INTRAVENOUS | Status: AC
  Administered 2018-07-23: 10:00:00 500 [IU] via INTRAVENOUS

## 2018-07-23 MED ORDER — HEPARIN SOD (PORK) LOCK FLUSH 100 UNIT/ML IV SOLN
INTRAVENOUS | Status: AC
  Filled 2018-07-23: qty 5

## 2018-07-29 DIAGNOSIS — N61 Mastitis without abscess: Secondary | ICD-10-CM | POA: Diagnosis not present

## 2018-07-29 DIAGNOSIS — L304 Erythema intertrigo: Secondary | ICD-10-CM | POA: Diagnosis not present

## 2018-07-30 ENCOUNTER — Other Ambulatory Visit: Payer: Self-pay

## 2018-07-31 ENCOUNTER — Other Ambulatory Visit: Payer: Self-pay

## 2018-07-31 ENCOUNTER — Inpatient Hospital Stay: Payer: Medicare Other

## 2018-07-31 DIAGNOSIS — E876 Hypokalemia: Secondary | ICD-10-CM | POA: Diagnosis not present

## 2018-07-31 DIAGNOSIS — C7951 Secondary malignant neoplasm of bone: Secondary | ICD-10-CM | POA: Diagnosis not present

## 2018-07-31 DIAGNOSIS — C3490 Malignant neoplasm of unspecified part of unspecified bronchus or lung: Secondary | ICD-10-CM | POA: Diagnosis not present

## 2018-07-31 DIAGNOSIS — N61 Mastitis without abscess: Secondary | ICD-10-CM | POA: Diagnosis not present

## 2018-07-31 DIAGNOSIS — Z5111 Encounter for antineoplastic chemotherapy: Secondary | ICD-10-CM | POA: Diagnosis not present

## 2018-07-31 DIAGNOSIS — L304 Erythema intertrigo: Secondary | ICD-10-CM | POA: Diagnosis not present

## 2018-07-31 DIAGNOSIS — C787 Secondary malignant neoplasm of liver and intrahepatic bile duct: Secondary | ICD-10-CM | POA: Diagnosis not present

## 2018-07-31 DIAGNOSIS — D649 Anemia, unspecified: Secondary | ICD-10-CM | POA: Diagnosis not present

## 2018-07-31 LAB — COMPREHENSIVE METABOLIC PANEL
ALT: 27 U/L (ref 0–44)
AST: 30 U/L (ref 15–41)
Albumin: 3.4 g/dL — ABNORMAL LOW (ref 3.5–5.0)
Alkaline Phosphatase: 88 U/L (ref 38–126)
Anion gap: 10 (ref 5–15)
BUN: 10 mg/dL (ref 8–23)
CO2: 23 mmol/L (ref 22–32)
Calcium: 8.9 mg/dL (ref 8.9–10.3)
Chloride: 106 mmol/L (ref 98–111)
Creatinine, Ser: 0.84 mg/dL (ref 0.44–1.00)
GFR calc Af Amer: >60 mL/min (ref >60)
GFR calc non Af Amer: >60 mL/min (ref >60)
Glucose, Bld: 127 mg/dL — ABNORMAL HIGH (ref 70–99)
Potassium: 3.6 mmol/L (ref 3.5–5.1)
Sodium: 139 mmol/L (ref 135–145)
Total Bilirubin: 0.4 mg/dL (ref 0.3–1.2)
Total Protein: 8.7 g/dL — ABNORMAL HIGH (ref 6.5–8.1)

## 2018-07-31 LAB — CBC WITH DIFFERENTIAL/PLATELET
Abs Immature Granulocytes: 0.01 10*3/uL (ref 0.00–0.07)
Basophils Absolute: 0 10*3/uL (ref 0.0–0.1)
Basophils Relative: 0 %
Eosinophils Absolute: 0.1 10*3/uL (ref 0.0–0.5)
Eosinophils Relative: 5 %
HCT: 24.9 % — ABNORMAL LOW (ref 36.0–46.0)
Hemoglobin: 7.7 g/dL — ABNORMAL LOW (ref 12.0–15.0)
Immature Granulocytes: 1 %
Lymphocytes Relative: 51 %
Lymphs Abs: 0.9 10*3/uL (ref 0.7–4.0)
MCH: 28.3 pg (ref 26.0–34.0)
MCHC: 30.9 g/dL (ref 30.0–36.0)
MCV: 91.5 fL (ref 80.0–100.0)
Monocytes Absolute: 0.1 10*3/uL (ref 0.1–1.0)
Monocytes Relative: 6 %
Neutro Abs: 0.6 10*3/uL — ABNORMAL LOW (ref 1.7–7.7)
Neutrophils Relative %: 37 %
Platelets: 48 10*3/uL — ABNORMAL LOW (ref 150–400)
RBC: 2.72 MIL/uL — ABNORMAL LOW (ref 3.87–5.11)
RDW: 17 % — ABNORMAL HIGH (ref 11.5–15.5)
Smear Review: NORMAL
WBC: 1.7 10*3/uL — ABNORMAL LOW (ref 4.0–10.5)
nRBC: 0 % (ref 0.0–0.2)

## 2018-07-31 LAB — SAMPLE TO BLOOD BANK

## 2018-08-03 DIAGNOSIS — L821 Other seborrheic keratosis: Secondary | ICD-10-CM | POA: Diagnosis not present

## 2018-08-03 DIAGNOSIS — L820 Inflamed seborrheic keratosis: Secondary | ICD-10-CM | POA: Diagnosis not present

## 2018-08-09 ENCOUNTER — Other Ambulatory Visit: Payer: Self-pay | Admitting: Oncology

## 2018-08-10 ENCOUNTER — Other Ambulatory Visit: Payer: Self-pay

## 2018-08-12 NOTE — Progress Notes (Signed)
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  INTERVAL HISTORY: Patient returns to clinic today for further evaluation and consideration of cycle 9 of topotecan.  She currently feels well and is asymptomatic. Her pain is well controlled on her current dose of MS Contin.  She has chronic weakness and fatigue, but otherwise is tolerating her treatments well.  She has no neurologic complaints.  She denies any recent fevers or illnesses.  She has a good appetite and denies weight loss.  She has chronic shortness of breath, but denies any chest pain, cough, or hemoptysis. She has no abdominal pain.  She denies any nausea, vomiting, constipation, or diarrhea.  She denies any melena or hematochezia.  She has no urinary complaints.  Patient feels at her baseline offers no further specific complaints today.  REVIEW OF SYSTEMS:   Review of Systems  Constitutional: Positive for malaise/fatigue. Negative for fever and weight loss.  Respiratory: Positive for shortness of breath. Negative for cough, hemoptysis and wheezing.   Cardiovascular: Negative.  Negative for chest pain and leg swelling.  Gastrointestinal: Positive for nausea. Negative for abdominal pain, blood in stool, diarrhea, melena and vomiting.  Genitourinary: Negative.  Negative for dysuria and frequency.  Musculoskeletal: Positive for back pain, joint pain and neck pain.  Skin: Negative.  Negative for rash.  Neurological: Positive for weakness. Negative for sensory change, focal weakness and headaches.  Psychiatric/Behavioral: Negative.  Negative for memory loss. The patient is not nervous/anxious.     As per HPI. Otherwise, a complete review of  systems is negative.  PAST MEDICAL HISTORY: Past Medical History:  Diagnosis Date  . Anxiety   . Arthritis   . Asthma    COPD  . CAP (community acquired pneumonia) 06/30/2014  . Cardiomyopathy ([place])    nonischemic (EF 35-40%)  . CHF (congestive heart failure) ([place])   . COPD (chronic obstructive pulmonary disease) ([place])   . Depression   . Dyspnea   . GERD (gastroesophageal reflux disease)   . Hyperlipidemia   . Hypertension   . Pneumonia   . PVC's (premature ventricular contractions)    states 10 years ago  . Sepsis ([place]) 06/30/2014  . Small cell lung cancer ([place]) 12/2016   Chemo tx's.  . SOB (shortness of breath) 05/08/2014  . Spinal cord injury at T7-T12 level [facility])    2016    PAST SURGICAL HISTORY: Past Surgical History:  Procedure Laterality Date  . APPENDECTOMY    . CARDIAC CATHETERIZATION    . CATARACT EXTRACTION     right eye   . CESAREAN SECTION    . COLONOSCOPY    . LAMINOTOMY    . LUMBAR LAMINECTOMY/DECOMPRESSION MICRODISCECTOMY Left 08/01/2016   Procedure: Left Lumbar Four-Five Laminectomy and Foraminotomy;  Surgeon: Earnie Larsson, MD;  Location: [place];  Service: Neurosurgery;  Laterality: Left;  . PORTA CATH INSERTION N/A 02/03/2017   Procedure: PORTA CATH INSERTION;  Surgeon: Katha Cabal, MD;  Location: [place] [facility];  Service: Cardiovascular;  Laterality: N/A;    FAMILY HISTORY: Family History  Problem Relation Age of Onset  . Heart attack Father 60       MI x 3     ADVANCED DIRECTIVES (Y/N):  N  HEALTH MAINTENANCE: Social History   Tobacco Use  . Smoking status: Former Smoker    Packs/day: 0.50    Years: 50.00    Pack years: 25.00    Types: Cigarettes    Quit date: 01/27/2015    Years since quitting: 3.5  . Smokeless tobacco: Never Used  Substance Use Topics  . Alcohol use: No  . Drug use: No     Colonoscopy:  PAP:  Bone density:  Lipid panel:  Allergies  Allergen Reactions  . Fish Allergy Anaphylaxis    Per pt  after eating [facility] she had throat swelling and SOB. MSY   . Fentanyl     Overly sedated and groggy, ineffective     Current Outpatient Medications  Medication Sig Dispense Refill  . ADVAIR DISKUS 250-50 MCG/DOSE AEPB Inhale 1 puff into the lungs 2 (two) times daily.    Marland Kitchen albuterol (PROAIR HFA) 108 (90 Base) MCG/ACT inhaler Inhale 2 puffs into the lungs every 6 (six) hours as needed for wheezing or shortness of breath.     Marland Kitchen alendronate (FOSAMAX) 70 MG tablet Take 70 mg by mouth every Thursday.     . ARIPiprazole (ABILIFY) 5 MG tablet Take 5 mg by mouth daily.    Marland Kitchen BELBUCA 150 MCG FILM Take 1 tablet by mouth daily.  0  . benzonatate (TESSALON) 100 MG capsule Take 1 capsule (100 mg total) by mouth every 8 (eight) hours as needed for cough. 20 capsule 0  . dextromethorphan-guaiFENesin (MUCINEX DM) 30-600 MG 12hr tablet Take 1 tablet by mouth 2 (two) times daily as needed for cough.    . diphenoxylate-atropine (LOMOTIL) 2.5-0.025 MG tablet Take 1 tablet by mouth 4 (four) times daily as needed for diarrhea or loose stools. 30 tablet 0  . DULoxetine (CYMBALTA) 60 MG capsule Take 60 mg by mouth daily.    Marland Kitchen ipratropium-albuterol (DUONEB) 0.5-2.5 (3) MG/3ML SOLN Take 3 mLs by nebulization every 4 (four) hours as needed. 360 mL 1  . LORazepam (ATIVAN) 0.5 MG tablet Take 0.5 mg by mouth as needed.    . metoprolol succinate (TOPROL-XL) 100 MG 24 hr tablet Take 1 tablet by mouth 1 day or 1 dose.    . morphine (MS CONTIN) 30 MG 12 hr tablet Take 1 tablet (30 mg total) by mouth every 8 (eight) hours. 90 tablet 0  . pantoprazole (PROTONIX) 40 MG tablet Take 1 tablet by mouth daily.    . potassium chloride SA (K-DUR,KLOR-CON) 20 MEQ tablet Take 1 tablet (20 mEq total) by mouth daily. 30 tablet 2  . prochlorperazine (COMPAZINE) 10 MG tablet Take 1 tablet (10 mg total) by mouth every 6 (six) hours as needed (Nausea or vomiting). 60 tablet 2  . promethazine (PHENERGAN) 25 MG tablet Take 1 tablet (25 mg total)  by mouth every 8 (eight) hours as needed for nausea or vomiting. 30 tablet 2  . sacubitril-valsartan (ENTRESTO) 24-26 MG Take 1 tablet by mouth 2 (two) times daily. 180 tablet 3  . tiZANidine (ZANAFLEX) 4 MG tablet Take 1 tablet (4 mg total) by mouth every 8 (eight) hours as needed for muscle spasms. (Patient not taking: Reported on 05/21/2018) 90 tablet 2   No current facility-administered medications for this visit.    Facility-Administered Medications Ordered in Other Visits  Medication Dose Route Frequency Provider Last Rate Last Dose  . heparin lock flush 100 unit/mL  500 Units Intracatheter Once PRN Lloyd Huger, MD      .  sodium chloride flush (NS) 0.9 % injection 10 mL  10 mL Intravenous PRN Lloyd Huger, MD   10 mL at 03/01/17 0841  . topotecan (HYCAMTIN) 2.7 mg in sodium chloride 0.9 % 100 mL chemo infusion  1.3 mg/m2 (Treatment Plan Recorded) Intravenous Once Lloyd Huger, MD 205.4 mL/hr at 08/13/18 1219 2.7 mg at 08/13/18 1219    OBJECTIVE: Vitals:   08/13/18 1056 08/13/18 1057  BP:  110/72  Pulse:  72  Resp: 18 18     There is no height or weight on file to calculate BMI.    ECOG FS:2 - Symptomatic, <50% confined to bed  General: Well-developed, well-nourished, no acute distress.  Sitting in a wheelchair. Eyes: Pink conjunctiva, anicteric sclera. HEENT: Normocephalic, moist mucous membranes. Lungs: Clear to auscultation bilaterally. Heart: Regular rate and rhythm. No rubs, murmurs, or gallops. Abdomen: Soft, nontender, nondistended. No organomegaly noted, normoactive bowel sounds. Musculoskeletal: No edema, cyanosis, or clubbing. Neuro: Alert, answering all questions appropriately. Cranial nerves grossly intact. Skin: No rashes or petechiae noted. Psych: Normal affect.  LAB RESULTS:  Lab Results  Component Value Date   NA 138 08/13/2018   K 3.6 08/13/2018   CL 104 08/13/2018   CO2 26 08/13/2018   GLUCOSE 116 (H) 08/13/2018   BUN 17 08/13/2018    CREATININE 0.82 08/13/2018   CALCIUM 8.5 (L) 08/13/2018   PROT 8.1 08/13/2018   ALBUMIN 3.4 (L) 08/13/2018   AST 30 08/13/2018   ALT 36 08/13/2018   ALKPHOS 88 08/13/2018   BILITOT 0.4 08/13/2018   GFRNONAA >60 08/13/2018   GFRAA >60 08/13/2018    Lab Results  Component Value Date   WBC 7.2 08/13/2018   NEUTROABS 4.4 08/13/2018   HGB 9.4 (L) 08/13/2018   HCT 31.4 (L) 08/13/2018   MCV 94.9 08/13/2018   PLT 212 08/13/2018     STUDIES: No results found.  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  PLAN:  1. 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  Patient then received maintenance Tecentriq from Jun 21, 2017 through November 15, 2017.  CT scan results from December 25, 2017 reviewed independently with progressive disease in her mediastinum as well as significant progression of multifocal hepatic disease.  Hospice was briefly discussed, but patient wished to continue aggressive treatment.  Patient was initially scheduled to get topotecan on days 1 through 5 every 21 days, but given her persistent thrombocytopenia she can only receive treatment every 28 days.  Patient's most recent imaging on Jun 15, 2018 reviewed independently with improvement in patient's known metastatic disease in her liver.  Proceed with cycle 9, day 1 of dose reduced topotecan today.  Patient will only receive 4 infusions this week secondary to 4 July holiday.  Return to clinic in 1 month for further evaluation and consideration of cycle 10.  Will reimage in August 2020 prior to cycle 11.    2. Pain: Continue MS Contin to 30 mg every 8 hours. Patient states she has "fired" her pain doctor.  3. Thrombocytopenia: Resolved.  Proceed with treatment every 28 days as above. 4.  Anemia: Patient's hemoglobin improved to 9.4, monitor. 5.   Neutropenia: Resolved.   6.  COPD: Chronic and unchanged.  Continue outpatient palliative care.  Continue current medications as prescribed. 7.  Hypokalemia: Resolved.  Continue  oral potassium supplementation. 8.  Disposition: Hospice and end-of-life care were previously discussed, but patient wishes to continue aggressive treatments.  She expressed understanding that her treatment options are limited.  She confirms that she is a DNR and is having outpatient palliative care visit.   Patient expressed understanding and was in agreement with this plan. She also understands that She can call clinic at any time with any questions, concerns, or complaints.   Cancer Staging Small cell lung cancer [facility]) Staging form: Lung, AJCC 8th Edition - Clinical stage from 01/28/2017: Stage IV (cT4, cN3, pM1c) - Signed by Lloyd Huger, MD on 01/28/2017   Lloyd Huger, MD   08/13/2018 12:19 PM

## 2018-08-13 ENCOUNTER — Encounter: Payer: Self-pay | Admitting: Oncology

## 2018-08-13 ENCOUNTER — Inpatient Hospital Stay (HOSPITAL_BASED_OUTPATIENT_CLINIC_OR_DEPARTMENT_OTHER): Payer: Medicare Other | Admitting: Oncology

## 2018-08-13 ENCOUNTER — Other Ambulatory Visit: Payer: Self-pay | Admitting: *Deleted

## 2018-08-13 ENCOUNTER — Inpatient Hospital Stay: Payer: Medicare Other

## 2018-08-13 ENCOUNTER — Other Ambulatory Visit: Payer: Self-pay

## 2018-08-13 VITALS — BP 110/72 | HR 72 | Resp 18

## 2018-08-13 DIAGNOSIS — F329 Major depressive disorder, single episode, unspecified: Secondary | ICD-10-CM

## 2018-08-13 DIAGNOSIS — J449 Chronic obstructive pulmonary disease, unspecified: Secondary | ICD-10-CM | POA: Diagnosis not present

## 2018-08-13 DIAGNOSIS — Z5111 Encounter for antineoplastic chemotherapy: Secondary | ICD-10-CM

## 2018-08-13 DIAGNOSIS — E876 Hypokalemia: Secondary | ICD-10-CM

## 2018-08-13 DIAGNOSIS — R531 Weakness: Secondary | ICD-10-CM

## 2018-08-13 DIAGNOSIS — I429 Cardiomyopathy, unspecified: Secondary | ICD-10-CM

## 2018-08-13 DIAGNOSIS — D649 Anemia, unspecified: Secondary | ICD-10-CM

## 2018-08-13 DIAGNOSIS — Z87891 Personal history of nicotine dependence: Secondary | ICD-10-CM

## 2018-08-13 DIAGNOSIS — E785 Hyperlipidemia, unspecified: Secondary | ICD-10-CM

## 2018-08-13 DIAGNOSIS — R5383 Other fatigue: Secondary | ICD-10-CM

## 2018-08-13 DIAGNOSIS — M129 Arthropathy, unspecified: Secondary | ICD-10-CM | POA: Diagnosis not present

## 2018-08-13 DIAGNOSIS — Z79899 Other long term (current) drug therapy: Secondary | ICD-10-CM

## 2018-08-13 DIAGNOSIS — C7951 Secondary malignant neoplasm of bone: Secondary | ICD-10-CM | POA: Diagnosis not present

## 2018-08-13 DIAGNOSIS — I110 Hypertensive heart disease with heart failure: Secondary | ICD-10-CM

## 2018-08-13 DIAGNOSIS — C787 Secondary malignant neoplasm of liver and intrahepatic bile duct: Secondary | ICD-10-CM

## 2018-08-13 DIAGNOSIS — C3490 Malignant neoplasm of unspecified part of unspecified bronchus or lung: Secondary | ICD-10-CM

## 2018-08-13 DIAGNOSIS — R110 Nausea: Secondary | ICD-10-CM

## 2018-08-13 DIAGNOSIS — K219 Gastro-esophageal reflux disease without esophagitis: Secondary | ICD-10-CM

## 2018-08-13 DIAGNOSIS — Z95828 Presence of other vascular implants and grafts: Secondary | ICD-10-CM

## 2018-08-13 DIAGNOSIS — I509 Heart failure, unspecified: Secondary | ICD-10-CM

## 2018-08-13 LAB — SAMPLE TO BLOOD BANK

## 2018-08-13 LAB — CBC WITH DIFFERENTIAL/PLATELET
Abs Immature Granulocytes: 0.04 10*3/uL (ref 0.00–0.07)
Basophils Absolute: 0.1 10*3/uL (ref 0.0–0.1)
Basophils Relative: 1 %
Eosinophils Absolute: 0 10*3/uL (ref 0.0–0.5)
Eosinophils Relative: 0 %
HCT: 31.4 % — ABNORMAL LOW (ref 36.0–46.0)
Hemoglobin: 9.4 g/dL — ABNORMAL LOW (ref 12.0–15.0)
Immature Granulocytes: 1 %
Lymphocytes Relative: 28 %
Lymphs Abs: 2 10*3/uL (ref 0.7–4.0)
MCH: 28.4 pg (ref 26.0–34.0)
MCHC: 29.9 g/dL — ABNORMAL LOW (ref 30.0–36.0)
MCV: 94.9 fL (ref 80.0–100.0)
Monocytes Absolute: 0.7 10*3/uL (ref 0.1–1.0)
Monocytes Relative: 10 %
Neutro Abs: 4.4 10*3/uL (ref 1.7–7.7)
Neutrophils Relative %: 60 %
Platelets: 212 10*3/uL (ref 150–400)
RBC: 3.31 MIL/uL — ABNORMAL LOW (ref 3.87–5.11)
RDW: 19.9 % — ABNORMAL HIGH (ref 11.5–15.5)
WBC: 7.2 10*3/uL (ref 4.0–10.5)
nRBC: 0 % (ref 0.0–0.2)

## 2018-08-13 LAB — COMPREHENSIVE METABOLIC PANEL
ALT: 36 U/L (ref 0–44)
AST: 30 U/L (ref 15–41)
Albumin: 3.4 g/dL — ABNORMAL LOW (ref 3.5–5.0)
Alkaline Phosphatase: 88 U/L (ref 38–126)
Anion gap: 8 (ref 5–15)
BUN: 17 mg/dL (ref 8–23)
CO2: 26 mmol/L (ref 22–32)
Calcium: 8.5 mg/dL — ABNORMAL LOW (ref 8.9–10.3)
Chloride: 104 mmol/L (ref 98–111)
Creatinine, Ser: 0.82 mg/dL (ref 0.44–1.00)
GFR calc Af Amer: >60 mL/min (ref >60)
GFR calc non Af Amer: >60 mL/min (ref >60)
Glucose, Bld: 116 mg/dL — ABNORMAL HIGH (ref 70–99)
Potassium: 3.6 mmol/L (ref 3.5–5.1)
Sodium: 138 mmol/L (ref 135–145)
Total Bilirubin: 0.4 mg/dL (ref 0.3–1.2)
Total Protein: 8.1 g/dL (ref 6.5–8.1)

## 2018-08-13 MED ORDER — SODIUM CHLORIDE 0.9% FLUSH
10.0000 mL | Freq: Once | INTRAVENOUS | Status: AC
  Administered 2018-08-13: 11:00:00 10 mL via INTRAVENOUS
  Filled 2018-08-13: qty 10

## 2018-08-13 MED ORDER — TOPOTECAN HCL CHEMO INJECTION 4 MG
1.3000 mg/m2 | Freq: Once | INTRAVENOUS | Status: AC
  Administered 2018-08-13: 12:00:00 2.7 mg via INTRAVENOUS
  Filled 2018-08-13: qty 2.7

## 2018-08-13 MED ORDER — ZOLEDRONIC ACID 4 MG/100ML IV SOLN
4.0000 mg | Freq: Once | INTRAVENOUS | Status: AC
  Administered 2018-08-13: 4 mg via INTRAVENOUS
  Filled 2018-08-13: qty 100

## 2018-08-13 MED ORDER — PROCHLORPERAZINE MALEATE 10 MG PO TABS
10.0000 mg | ORAL_TABLET | Freq: Once | ORAL | Status: AC
  Administered 2018-08-13: 12:00:00 10 mg via ORAL
  Filled 2018-08-13: qty 1

## 2018-08-13 MED ORDER — SODIUM CHLORIDE 0.9 % IV SOLN
Freq: Once | INTRAVENOUS | Status: AC
  Administered 2018-08-13: 12:00:00 via INTRAVENOUS
  Filled 2018-08-13: qty 250

## 2018-08-13 MED ORDER — MORPHINE SULFATE ER 30 MG PO TBCR: 30.0000 mg | EXTENDED_RELEASE_TABLET | Freq: Three times a day (TID) | ORAL | 0 refills | Status: DC

## 2018-08-13 MED ORDER — HEPARIN SOD (PORK) LOCK FLUSH 100 UNIT/ML IV SOLN
500.0000 [IU] | Freq: Once | INTRAVENOUS | Status: AC | PRN
  Administered 2018-08-13: 13:00:00 500 [IU]
  Filled 2018-08-13: qty 5

## 2018-08-13 NOTE — Progress Notes (Signed)
Patient denies any concerns today.  

## 2018-08-14 ENCOUNTER — Other Ambulatory Visit: Payer: Self-pay

## 2018-08-14 ENCOUNTER — Inpatient Hospital Stay: Payer: Medicare Other

## 2018-08-14 DIAGNOSIS — Z5111 Encounter for antineoplastic chemotherapy: Secondary | ICD-10-CM | POA: Diagnosis not present

## 2018-08-14 DIAGNOSIS — C787 Secondary malignant neoplasm of liver and intrahepatic bile duct: Secondary | ICD-10-CM | POA: Diagnosis not present

## 2018-08-14 DIAGNOSIS — C3490 Malignant neoplasm of unspecified part of unspecified bronchus or lung: Secondary | ICD-10-CM

## 2018-08-14 DIAGNOSIS — E876 Hypokalemia: Secondary | ICD-10-CM | POA: Diagnosis not present

## 2018-08-14 DIAGNOSIS — D649 Anemia, unspecified: Secondary | ICD-10-CM | POA: Diagnosis not present

## 2018-08-14 DIAGNOSIS — C7951 Secondary malignant neoplasm of bone: Secondary | ICD-10-CM | POA: Diagnosis not present

## 2018-08-14 MED ORDER — TOPOTECAN HCL CHEMO INJECTION 4 MG
1.3000 mg/m2 | Freq: Once | INTRAVENOUS | Status: AC
  Administered 2018-08-14: 2.7 mg via INTRAVENOUS
  Filled 2018-08-14: qty 2.7

## 2018-08-14 MED ORDER — SODIUM CHLORIDE 0.9 % IV SOLN
Freq: Once | INTRAVENOUS | Status: AC
  Administered 2018-08-14: 10:00:00 via INTRAVENOUS
  Filled 2018-08-14: qty 250

## 2018-08-14 MED ORDER — PROCHLORPERAZINE MALEATE 10 MG PO TABS
10.0000 mg | ORAL_TABLET | Freq: Once | ORAL | Status: AC
  Administered 2018-08-14: 10:00:00 10 mg via ORAL
  Filled 2018-08-14: qty 1

## 2018-08-14 MED ORDER — HEPARIN SOD (PORK) LOCK FLUSH 100 UNIT/ML IV SOLN
500.0000 [IU] | Freq: Once | INTRAVENOUS | Status: AC | PRN
  Administered 2018-08-14: 11:00:00 500 [IU]
  Filled 2018-08-14: qty 5

## 2018-08-15 ENCOUNTER — Other Ambulatory Visit: Payer: Self-pay

## 2018-08-15 ENCOUNTER — Inpatient Hospital Stay: Payer: Medicare Other | Attending: Oncology

## 2018-08-15 VITALS — BP 118/80 | HR 69 | Temp 97.9°F | Resp 18

## 2018-08-15 DIAGNOSIS — J449 Chronic obstructive pulmonary disease, unspecified: Secondary | ICD-10-CM | POA: Insufficient documentation

## 2018-08-15 DIAGNOSIS — C7951 Secondary malignant neoplasm of bone: Secondary | ICD-10-CM | POA: Insufficient documentation

## 2018-08-15 DIAGNOSIS — E785 Hyperlipidemia, unspecified: Secondary | ICD-10-CM | POA: Insufficient documentation

## 2018-08-15 DIAGNOSIS — I110 Hypertensive heart disease with heart failure: Secondary | ICD-10-CM | POA: Insufficient documentation

## 2018-08-15 DIAGNOSIS — Z79899 Other long term (current) drug therapy: Secondary | ICD-10-CM | POA: Diagnosis not present

## 2018-08-15 DIAGNOSIS — M129 Arthropathy, unspecified: Secondary | ICD-10-CM | POA: Insufficient documentation

## 2018-08-15 DIAGNOSIS — R5383 Other fatigue: Secondary | ICD-10-CM | POA: Insufficient documentation

## 2018-08-15 DIAGNOSIS — D649 Anemia, unspecified: Secondary | ICD-10-CM | POA: Diagnosis not present

## 2018-08-15 DIAGNOSIS — K219 Gastro-esophageal reflux disease without esophagitis: Secondary | ICD-10-CM | POA: Insufficient documentation

## 2018-08-15 DIAGNOSIS — C787 Secondary malignant neoplasm of liver and intrahepatic bile duct: Secondary | ICD-10-CM | POA: Insufficient documentation

## 2018-08-15 DIAGNOSIS — R531 Weakness: Secondary | ICD-10-CM | POA: Insufficient documentation

## 2018-08-15 DIAGNOSIS — Z5111 Encounter for antineoplastic chemotherapy: Secondary | ICD-10-CM | POA: Diagnosis not present

## 2018-08-15 DIAGNOSIS — I429 Cardiomyopathy, unspecified: Secondary | ICD-10-CM | POA: Diagnosis not present

## 2018-08-15 DIAGNOSIS — C3490 Malignant neoplasm of unspecified part of unspecified bronchus or lung: Secondary | ICD-10-CM | POA: Diagnosis not present

## 2018-08-15 DIAGNOSIS — I509 Heart failure, unspecified: Secondary | ICD-10-CM | POA: Insufficient documentation

## 2018-08-15 DIAGNOSIS — Z87891 Personal history of nicotine dependence: Secondary | ICD-10-CM | POA: Insufficient documentation

## 2018-08-15 MED ORDER — TOPOTECAN HCL CHEMO INJECTION 4 MG
1.3000 mg/m2 | Freq: Once | INTRAVENOUS | Status: AC
  Administered 2018-08-15: 2.7 mg via INTRAVENOUS
  Filled 2018-08-15: qty 2.7

## 2018-08-15 MED ORDER — HEPARIN SOD (PORK) LOCK FLUSH 100 UNIT/ML IV SOLN
500.0000 [IU] | Freq: Once | INTRAVENOUS | Status: AC
  Administered 2018-08-15: 500 [IU] via INTRAVENOUS
  Filled 2018-08-15: qty 5

## 2018-08-15 MED ORDER — SODIUM CHLORIDE 0.9% FLUSH
10.0000 mL | INTRAVENOUS | Status: DC | PRN
  Administered 2018-08-15: 10 mL via INTRAVENOUS
  Filled 2018-08-15: qty 10

## 2018-08-15 MED ORDER — SODIUM CHLORIDE 0.9 % IV SOLN
Freq: Once | INTRAVENOUS | Status: AC
  Administered 2018-08-15: 10:00:00 via INTRAVENOUS
  Filled 2018-08-15: qty 250

## 2018-08-15 MED ORDER — PROCHLORPERAZINE MALEATE 10 MG PO TABS
10.0000 mg | ORAL_TABLET | Freq: Once | ORAL | Status: AC
  Administered 2018-08-15: 10 mg via ORAL
  Filled 2018-08-15: qty 1

## 2018-08-16 ENCOUNTER — Inpatient Hospital Stay: Payer: Medicare Other

## 2018-08-16 ENCOUNTER — Other Ambulatory Visit: Payer: Self-pay

## 2018-08-16 VITALS — BP 114/77 | HR 98 | Temp 96.6°F | Resp 20

## 2018-08-16 DIAGNOSIS — Z5111 Encounter for antineoplastic chemotherapy: Secondary | ICD-10-CM | POA: Diagnosis not present

## 2018-08-16 DIAGNOSIS — C3490 Malignant neoplasm of unspecified part of unspecified bronchus or lung: Secondary | ICD-10-CM

## 2018-08-16 DIAGNOSIS — R531 Weakness: Secondary | ICD-10-CM | POA: Diagnosis not present

## 2018-08-16 DIAGNOSIS — C7951 Secondary malignant neoplasm of bone: Secondary | ICD-10-CM | POA: Diagnosis not present

## 2018-08-16 DIAGNOSIS — R5383 Other fatigue: Secondary | ICD-10-CM | POA: Diagnosis not present

## 2018-08-16 DIAGNOSIS — C787 Secondary malignant neoplasm of liver and intrahepatic bile duct: Secondary | ICD-10-CM | POA: Diagnosis not present

## 2018-08-16 MED ORDER — PROCHLORPERAZINE MALEATE 10 MG PO TABS
10.0000 mg | ORAL_TABLET | Freq: Once | ORAL | Status: AC
  Administered 2018-08-16: 09:00:00 10 mg via ORAL
  Filled 2018-08-16: qty 1

## 2018-08-16 MED ORDER — TOPOTECAN HCL CHEMO INJECTION 4 MG
1.3000 mg/m2 | Freq: Once | INTRAVENOUS | Status: AC
  Administered 2018-08-16: 2.7 mg via INTRAVENOUS
  Filled 2018-08-16: qty 2.7

## 2018-08-16 MED ORDER — HEPARIN SOD (PORK) LOCK FLUSH 100 UNIT/ML IV SOLN
500.0000 [IU] | Freq: Once | INTRAVENOUS | Status: AC | PRN
  Administered 2018-08-16: 11:00:00 500 [IU]
  Filled 2018-08-16: qty 5

## 2018-08-16 MED ORDER — SODIUM CHLORIDE 0.9 % IV SOLN
Freq: Once | INTRAVENOUS | Status: AC
  Administered 2018-08-16: 09:00:00 via INTRAVENOUS
  Filled 2018-08-16: qty 250

## 2018-08-16 MED ORDER — SODIUM CHLORIDE 0.9% FLUSH
10.0000 mL | INTRAVENOUS | Status: DC | PRN
  Administered 2018-08-16: 10 mL
  Filled 2018-08-16: qty 10

## 2018-08-24 NOTE — Progress Notes (Signed)
PSN has been attempting to reach patient by phone.  Patient does not have an answering machine attached to her home phone, and her voice mail is full on her cell phone.  PSN will continue to call patient to offer assistance.

## 2018-09-07 ENCOUNTER — Other Ambulatory Visit: Payer: Self-pay

## 2018-09-09 NOTE — Progress Notes (Signed)
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  INTERVAL HISTORY: Patient returns to clinic today for further evaluation and consideration of cycle 10 topotecan.  She continues to have chronic weakness and fatigue, but otherwise is tolerating her treatments well. Her pain is well controlled on her current dose of MS Contin. She has no neurologic complaints.  She denies any recent fevers or illnesses.  She has a good appetite and denies weight loss.  She has chronic shortness of breath, but denies any chest pain, cough, or hemoptysis. She has no abdominal pain.  She denies any nausea, vomiting, constipation, or diarrhea.  She denies any melena or hematochezia.  She has no urinary complaints.  Patient feels at her baseline offers no further specific complaints today.  REVIEW OF SYSTEMS:   Review of Systems  Constitutional: Positive for malaise/fatigue. Negative for fever and weight loss.  Respiratory: Positive for shortness of breath. Negative for cough, hemoptysis and wheezing.   Cardiovascular: Negative.  Negative for chest pain and leg swelling.  Gastrointestinal: Positive for nausea. Negative for abdominal pain, blood in stool, diarrhea, melena and vomiting.  Genitourinary: Negative.  Negative for dysuria and frequency.  Musculoskeletal: Positive for back pain, joint pain and neck pain.  Skin: Negative.  Negative for rash.  Neurological: Positive for weakness. Negative for sensory change, focal weakness and headaches.  Psychiatric/Behavioral: Negative.  Negative for memory loss. The patient is not nervous/anxious.     As per HPI. Otherwise, a complete review of systems is negative.  PAST MEDICAL  HISTORY: Past Medical History:  Diagnosis Date  . Anxiety   . Arthritis   . Asthma    COPD  . CAP (community acquired pneumonia) 06/30/2014  . Cardiomyopathy ([place])    nonischemic (EF 35-40%)  . CHF (congestive heart failure) ([place])   . COPD (chronic obstructive pulmonary disease) ([place])   . Depression   . Dyspnea   . GERD (gastroesophageal reflux disease)   . Hyperlipidemia   . Hypertension   . Pneumonia   . PVC's (premature ventricular contractions)    states 10 years ago  . Sepsis ([place]) 06/30/2014  . Small cell lung cancer ([place]) 12/2016   Chemo tx's.  . SOB (shortness of breath) 05/08/2014  . Spinal cord injury at T7-T12 level [facility])    2016    PAST SURGICAL HISTORY: Past Surgical History:  Procedure Laterality Date  . APPENDECTOMY    . CARDIAC CATHETERIZATION    . CATARACT EXTRACTION     right eye   . CESAREAN SECTION    . COLONOSCOPY    . LAMINOTOMY    . LUMBAR LAMINECTOMY/DECOMPRESSION MICRODISCECTOMY Left 08/01/2016   Procedure: Left Lumbar Four-Five Laminectomy and Foraminotomy;  Surgeon: Earnie Larsson, MD;  Location: [place];  Service: Neurosurgery;  Laterality: Left;  . PORTA CATH INSERTION N/A 02/03/2017   Procedure: PORTA CATH INSERTION;  Surgeon: Katha Cabal, MD;  Location: [place] [facility];  Service: Cardiovascular;  Laterality: N/A;    FAMILY HISTORY: Family History  Problem Relation Age of Onset  . Heart attack Father 110       MI x 3     ADVANCED DIRECTIVES (Y/N):  N  HEALTH MAINTENANCE: Social History  Tobacco Use  . Smoking status: Former Smoker    Packs/day: 0.50    Years: 50.00    Pack years: 25.00    Types: Cigarettes    Quit date: 01/27/2015    Years since quitting: 3.6  . Smokeless tobacco: Never Used  Substance Use Topics  . Alcohol use: No  . Drug use: No     Colonoscopy:  PAP:  Bone density:  Lipid panel:  Allergies  Allergen Reactions  . Fish Allergy Anaphylaxis    Per pt after eating [facility] she had throat  swelling and SOB. MSY  Per pt after eating [facility] she had throat swelling and SOB. MSY   . Fentanyl     Overly sedated and groggy, ineffective     Current Outpatient Medications  Medication Sig Dispense Refill  . ADVAIR DISKUS 250-50 MCG/DOSE AEPB Inhale 1 puff into the lungs 2 (two) times daily.    Marland Kitchen albuterol (PROAIR HFA) 108 (90 Base) MCG/ACT inhaler Inhale 2 puffs into the lungs every 6 (six) hours as needed for wheezing or shortness of breath.     Marland Kitchen alendronate (FOSAMAX) 70 MG tablet Take 70 mg by mouth every Thursday.     . ARIPiprazole (ABILIFY) 5 MG tablet Take 5 mg by mouth daily.    Marland Kitchen BELBUCA 150 MCG FILM Take 1 tablet by mouth daily.  0  . benzonatate (TESSALON) 100 MG capsule Take 1 capsule (100 mg total) by mouth every 8 (eight) hours as needed for cough. 20 capsule 0  . dextromethorphan-guaiFENesin (MUCINEX DM) 30-600 MG 12hr tablet Take 1 tablet by mouth 2 (two) times daily as needed for cough.    . diphenoxylate-atropine (LOMOTIL) 2.5-0.025 MG tablet Take 1 tablet by mouth 4 (four) times daily as needed for diarrhea or loose stools. 30 tablet 0  . DULoxetine (CYMBALTA) 60 MG capsule Take 60 mg by mouth daily.    Marland Kitchen ipratropium-albuterol (DUONEB) 0.5-2.5 (3) MG/3ML SOLN Take 3 mLs by nebulization every 4 (four) hours as needed. 360 mL 1  . LORazepam (ATIVAN) 0.5 MG tablet Take 0.5 mg by mouth as needed.    . metoprolol succinate (TOPROL-XL) 100 MG 24 hr tablet Take 1 tablet by mouth 1 day or 1 dose.    . morphine (MS CONTIN) 30 MG 12 hr tablet Take 1 tablet (30 mg total) by mouth every 8 (eight) hours. 90 tablet 0  . pantoprazole (PROTONIX) 40 MG tablet Take 1 tablet by mouth daily.    . potassium chloride SA (K-DUR,KLOR-CON) 20 MEQ tablet Take 1 tablet (20 mEq total) by mouth daily. 30 tablet 2  . prochlorperazine (COMPAZINE) 10 MG tablet Take 1 tablet (10 mg total) by mouth every 6 (six) hours as needed (Nausea or vomiting). 60 tablet 2  . promethazine (PHENERGAN) 25 MG  tablet Take 1 tablet (25 mg total) by mouth every 8 (eight) hours as needed for nausea or vomiting. 30 tablet 2  . sacubitril-valsartan (ENTRESTO) 24-26 MG Take 1 tablet by mouth 2 (two) times daily. 180 tablet 3  . tiZANidine (ZANAFLEX) 4 MG tablet Take 1 tablet (4 mg total) by mouth every 8 (eight) hours as needed for muscle spasms. 90 tablet 2   No current facility-administered medications for this visit.    Facility-Administered Medications Ordered in Other Visits  Medication Dose Route Frequency Provider Last Rate Last Dose  . sodium chloride flush (NS) 0.9 % injection 10 mL  10 mL Intravenous PRN Lloyd Huger, MD  10 mL at 03/01/17 0841  . sodium chloride flush (NS) 0.9 % injection 10 mL  10 mL Intravenous PRN Lloyd Huger, MD   10 mL at 09/11/18 0918    OBJECTIVE: Vitals:   09/10/18 1003  BP: 96/69  Pulse: (!) 54  Temp: (!) 97.5 F (36.4 C)     There is no height or weight on file to calculate BMI.    ECOG FS:2 - Symptomatic, <50% confined to bed  General: Well-developed, well-nourished, no acute distress.  Sitting in a wheelchair. Eyes: Pink conjunctiva, anicteric sclera. HEENT: Normocephalic, moist mucous membranes. Lungs: Clear to auscultation bilaterally. Heart: Regular rate and rhythm. No rubs, murmurs, or gallops. Abdomen: Soft, nontender, nondistended. No organomegaly noted, normoactive bowel sounds. Musculoskeletal: No edema, cyanosis, or clubbing. Neuro: Alert, answering all questions appropriately. Cranial nerves grossly intact. Skin: No rashes or petechiae noted. Psych: Normal affect.  LAB RESULTS:  Lab Results  Component Value Date   NA 136 09/10/2018   K 4.0 09/10/2018   CL 104 09/10/2018   CO2 25 09/10/2018   GLUCOSE 105 (H) 09/10/2018   BUN 14 09/10/2018   CREATININE 0.91 09/10/2018   CALCIUM 8.2 (L) 09/10/2018   PROT 7.9 09/10/2018   ALBUMIN 3.5 09/10/2018   AST 37 09/10/2018   ALT 33 09/10/2018   ALKPHOS 79 09/10/2018   BILITOT  0.5 09/10/2018   GFRNONAA >60 09/10/2018   GFRAA >60 09/10/2018    Lab Results  Component Value Date   WBC 6.2 09/10/2018   NEUTROABS 3.8 09/10/2018   HGB 9.7 (L) 09/10/2018   HCT 32.5 (L) 09/10/2018   MCV 95.9 09/10/2018   PLT 270 09/10/2018     STUDIES: No results found.  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  PLAN:  1. 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  Patient then received maintenance Tecentriq from Jun 21, 2017 through November 15, 2017.  CT scan results from December 25, 2017 reviewed independently with progressive disease in her mediastinum as well as significant progression of multifocal hepatic disease.  Hospice was briefly discussed, but patient wished to continue aggressive treatment.  Patient was initially scheduled to get topotecan on days 1 through 5 every 21 days, but given her persistent thrombocytopenia she can only receive treatment every 28 days.  Patient's most recent imaging on Jun 15, 2018 reviewed independently with improvement in patient's known metastatic disease in her liver.  Proceed with cycle 10, day 1 of dose reduced topotecan today.  Return to clinic Tuesday through Friday for topotecan only.  Patient will then return to clinic in 1 month for further evaluation and consideration of cycle 11.  Will reimage 1 to 2 days prior to her next treatment.  2. Pain: Continue MS Contin to 30 mg every 8 hours. Patient states she has "fired" her pain doctor.  Patient was given a prescription today. 3. Thrombocytopenia: Resolved.  Proceed with treatment every 28 days as above. 4.  Anemia: Hemoglobin is decreased, but essentially stable at 9.7.  Monitor. 5.  Neutropenia: Resolved.   6.  COPD: Chronic and unchanged.  Continue outpatient palliative care.  Continue current medications  as prescribed. 7.  Hypokalemia: Resolved.  Continue oral potassium supplementation. 8.  Disposition: Hospice and end-of-life care were previously discussed, but patient wishes to continue aggressive treatments.  She  expressed understanding that her treatment options are limited.  She confirms that she is a DNR and is having outpatient palliative care visit.   Patient expressed understanding and was in agreement with this plan. She also understands that She can call clinic at any time with any questions, concerns, or complaints.   Cancer Staging Small cell lung cancer [facility]) Staging form: Lung, AJCC 8th Edition - Clinical stage from 01/28/2017: Stage IV (cT4, cN3, pM1c) - Signed by Lloyd Huger, MD on 01/28/2017   Lloyd Huger, MD   09/11/2018 4:10 PM

## 2018-09-10 ENCOUNTER — Other Ambulatory Visit: Payer: Self-pay

## 2018-09-10 ENCOUNTER — Encounter: Payer: Self-pay | Admitting: Oncology

## 2018-09-10 ENCOUNTER — Inpatient Hospital Stay: Payer: Medicare Other

## 2018-09-10 ENCOUNTER — Inpatient Hospital Stay (HOSPITAL_BASED_OUTPATIENT_CLINIC_OR_DEPARTMENT_OTHER): Payer: Medicare Other | Admitting: Oncology

## 2018-09-10 VITALS — Resp 19

## 2018-09-10 VITALS — BP 96/69 | HR 54 | Temp 97.5°F

## 2018-09-10 DIAGNOSIS — J449 Chronic obstructive pulmonary disease, unspecified: Secondary | ICD-10-CM | POA: Diagnosis not present

## 2018-09-10 DIAGNOSIS — I429 Cardiomyopathy, unspecified: Secondary | ICD-10-CM | POA: Diagnosis not present

## 2018-09-10 DIAGNOSIS — C7951 Secondary malignant neoplasm of bone: Secondary | ICD-10-CM

## 2018-09-10 DIAGNOSIS — Z87891 Personal history of nicotine dependence: Secondary | ICD-10-CM

## 2018-09-10 DIAGNOSIS — M129 Arthropathy, unspecified: Secondary | ICD-10-CM

## 2018-09-10 DIAGNOSIS — C3490 Malignant neoplasm of unspecified part of unspecified bronchus or lung: Secondary | ICD-10-CM | POA: Diagnosis not present

## 2018-09-10 DIAGNOSIS — Z95828 Presence of other vascular implants and grafts: Secondary | ICD-10-CM

## 2018-09-10 DIAGNOSIS — Z79899 Other long term (current) drug therapy: Secondary | ICD-10-CM

## 2018-09-10 DIAGNOSIS — K219 Gastro-esophageal reflux disease without esophagitis: Secondary | ICD-10-CM | POA: Diagnosis not present

## 2018-09-10 DIAGNOSIS — D649 Anemia, unspecified: Secondary | ICD-10-CM

## 2018-09-10 DIAGNOSIS — R531 Weakness: Secondary | ICD-10-CM

## 2018-09-10 DIAGNOSIS — I509 Heart failure, unspecified: Secondary | ICD-10-CM | POA: Diagnosis not present

## 2018-09-10 DIAGNOSIS — Z5111 Encounter for antineoplastic chemotherapy: Secondary | ICD-10-CM

## 2018-09-10 DIAGNOSIS — C787 Secondary malignant neoplasm of liver and intrahepatic bile duct: Secondary | ICD-10-CM

## 2018-09-10 DIAGNOSIS — R5383 Other fatigue: Secondary | ICD-10-CM

## 2018-09-10 DIAGNOSIS — I110 Hypertensive heart disease with heart failure: Secondary | ICD-10-CM

## 2018-09-10 DIAGNOSIS — E785 Hyperlipidemia, unspecified: Secondary | ICD-10-CM

## 2018-09-10 LAB — CBC WITH DIFFERENTIAL/PLATELET
Abs Immature Granulocytes: 0.01 10*3/uL (ref 0.00–0.07)
Basophils Absolute: 0 10*3/uL (ref 0.0–0.1)
Basophils Relative: 1 %
Eosinophils Absolute: 0 10*3/uL (ref 0.0–0.5)
Eosinophils Relative: 1 %
HCT: 32.5 % — ABNORMAL LOW (ref 36.0–46.0)
Hemoglobin: 9.7 g/dL — ABNORMAL LOW (ref 12.0–15.0)
Immature Granulocytes: 0 %
Lymphocytes Relative: 28 %
Lymphs Abs: 1.8 10*3/uL (ref 0.7–4.0)
MCH: 28.6 pg (ref 26.0–34.0)
MCHC: 29.8 g/dL — ABNORMAL LOW (ref 30.0–36.0)
MCV: 95.9 fL (ref 80.0–100.0)
Monocytes Absolute: 0.6 10*3/uL (ref 0.1–1.0)
Monocytes Relative: 10 %
Neutro Abs: 3.8 10*3/uL (ref 1.7–7.7)
Neutrophils Relative %: 60 %
Platelets: 270 10*3/uL (ref 150–400)
RBC: 3.39 MIL/uL — ABNORMAL LOW (ref 3.87–5.11)
RDW: 19.9 % — ABNORMAL HIGH (ref 11.5–15.5)
WBC: 6.2 10*3/uL (ref 4.0–10.5)
nRBC: 0 % (ref 0.0–0.2)

## 2018-09-10 LAB — COMPREHENSIVE METABOLIC PANEL
ALT: 33 U/L (ref 0–44)
AST: 37 U/L (ref 15–41)
Albumin: 3.5 g/dL (ref 3.5–5.0)
Alkaline Phosphatase: 79 U/L (ref 38–126)
Anion gap: 7 (ref 5–15)
BUN: 14 mg/dL (ref 8–23)
CO2: 25 mmol/L (ref 22–32)
Calcium: 8.2 mg/dL — ABNORMAL LOW (ref 8.9–10.3)
Chloride: 104 mmol/L (ref 98–111)
Creatinine, Ser: 0.91 mg/dL (ref 0.44–1.00)
GFR calc Af Amer: >60 mL/min (ref >60)
GFR calc non Af Amer: >60 mL/min (ref >60)
Glucose, Bld: 105 mg/dL — ABNORMAL HIGH (ref 70–99)
Potassium: 4 mmol/L (ref 3.5–5.1)
Sodium: 136 mmol/L (ref 135–145)
Total Bilirubin: 0.5 mg/dL (ref 0.3–1.2)
Total Protein: 7.9 g/dL (ref 6.5–8.1)

## 2018-09-10 LAB — SAMPLE TO BLOOD BANK

## 2018-09-10 MED ORDER — ZOLEDRONIC ACID 4 MG/100ML IV SOLN
4.0000 mg | Freq: Once | INTRAVENOUS | Status: AC
  Administered 2018-09-10: 4 mg via INTRAVENOUS
  Filled 2018-09-10: qty 100

## 2018-09-10 MED ORDER — TOPOTECAN HCL CHEMO INJECTION 4 MG
1.3000 mg/m2 | Freq: Once | INTRAVENOUS | Status: AC
  Administered 2018-09-10: 2.7 mg via INTRAVENOUS
  Filled 2018-09-10: qty 2.7

## 2018-09-10 MED ORDER — HEPARIN SOD (PORK) LOCK FLUSH 100 UNIT/ML IV SOLN
500.0000 [IU] | Freq: Once | INTRAVENOUS | Status: AC | PRN
  Administered 2018-09-10: 500 [IU]
  Filled 2018-09-10: qty 5

## 2018-09-10 MED ORDER — MORPHINE SULFATE ER 30 MG PO TBCR: 30.0000 mg | EXTENDED_RELEASE_TABLET | Freq: Three times a day (TID) | ORAL | 0 refills | Status: DC

## 2018-09-10 MED ORDER — PROCHLORPERAZINE MALEATE 10 MG PO TABS
10.0000 mg | ORAL_TABLET | Freq: Once | ORAL | Status: AC
  Administered 2018-09-10: 11:00:00 10 mg via ORAL
  Filled 2018-09-10: qty 1

## 2018-09-10 MED ORDER — SODIUM CHLORIDE 0.9% FLUSH
10.0000 mL | Freq: Once | INTRAVENOUS | Status: AC
  Administered 2018-09-10: 10 mL via INTRAVENOUS
  Filled 2018-09-10: qty 10

## 2018-09-10 MED ORDER — SODIUM CHLORIDE 0.9 % IV SOLN
Freq: Once | INTRAVENOUS | Status: AC
  Administered 2018-09-10: 10:00:00 via INTRAVENOUS
  Filled 2018-09-10: qty 250

## 2018-09-10 NOTE — Progress Notes (Signed)
Patient stated that she had been doing well. Patient is requesting  Refill on her Morphine.

## 2018-09-10 NOTE — Progress Notes (Signed)
Ca 8.2, proceed with Zometa per MD

## 2018-09-11 ENCOUNTER — Other Ambulatory Visit: Payer: Self-pay

## 2018-09-11 ENCOUNTER — Inpatient Hospital Stay: Payer: Medicare Other

## 2018-09-11 VITALS — BP 100/60 | HR 80 | Temp 97.4°F | Resp 20

## 2018-09-11 DIAGNOSIS — R531 Weakness: Secondary | ICD-10-CM | POA: Diagnosis not present

## 2018-09-11 DIAGNOSIS — R5383 Other fatigue: Secondary | ICD-10-CM | POA: Diagnosis not present

## 2018-09-11 DIAGNOSIS — C3490 Malignant neoplasm of unspecified part of unspecified bronchus or lung: Secondary | ICD-10-CM | POA: Diagnosis not present

## 2018-09-11 DIAGNOSIS — C787 Secondary malignant neoplasm of liver and intrahepatic bile duct: Secondary | ICD-10-CM | POA: Diagnosis not present

## 2018-09-11 DIAGNOSIS — C7951 Secondary malignant neoplasm of bone: Secondary | ICD-10-CM | POA: Diagnosis not present

## 2018-09-11 DIAGNOSIS — Z5111 Encounter for antineoplastic chemotherapy: Secondary | ICD-10-CM | POA: Diagnosis not present

## 2018-09-11 MED ORDER — HEPARIN SOD (PORK) LOCK FLUSH 100 UNIT/ML IV SOLN
INTRAVENOUS | Status: AC
  Filled 2018-09-11: qty 5

## 2018-09-11 MED ORDER — TOPOTECAN HCL CHEMO INJECTION 4 MG
1.3000 mg/m2 | Freq: Once | INTRAVENOUS | Status: AC
  Administered 2018-09-11: 2.7 mg via INTRAVENOUS
  Filled 2018-09-11: qty 2.7

## 2018-09-11 MED ORDER — PROCHLORPERAZINE MALEATE 10 MG PO TABS
10.0000 mg | ORAL_TABLET | Freq: Once | ORAL | Status: AC
  Administered 2018-09-11: 10 mg via ORAL
  Filled 2018-09-11: qty 1

## 2018-09-11 MED ORDER — SODIUM CHLORIDE 0.9% FLUSH
10.0000 mL | INTRAVENOUS | Status: DC | PRN
  Administered 2018-09-11: 10 mL via INTRAVENOUS
  Filled 2018-09-11: qty 10

## 2018-09-11 MED ORDER — HEPARIN SOD (PORK) LOCK FLUSH 100 UNIT/ML IV SOLN
500.0000 [IU] | Freq: Once | INTRAVENOUS | Status: AC
  Administered 2018-09-11: 500 [IU] via INTRAVENOUS

## 2018-09-11 MED ORDER — SODIUM CHLORIDE 0.9 % IV SOLN
Freq: Once | INTRAVENOUS | Status: AC
  Administered 2018-09-11: 09:00:00 via INTRAVENOUS
  Filled 2018-09-11: qty 250

## 2018-09-12 ENCOUNTER — Inpatient Hospital Stay: Payer: Medicare Other

## 2018-09-12 ENCOUNTER — Other Ambulatory Visit: Payer: Self-pay

## 2018-09-12 VITALS — BP 112/75 | HR 85 | Temp 98.2°F | Resp 19

## 2018-09-12 DIAGNOSIS — R531 Weakness: Secondary | ICD-10-CM | POA: Diagnosis not present

## 2018-09-12 DIAGNOSIS — C7951 Secondary malignant neoplasm of bone: Secondary | ICD-10-CM | POA: Diagnosis not present

## 2018-09-12 DIAGNOSIS — R5383 Other fatigue: Secondary | ICD-10-CM | POA: Diagnosis not present

## 2018-09-12 DIAGNOSIS — Z5111 Encounter for antineoplastic chemotherapy: Secondary | ICD-10-CM | POA: Diagnosis not present

## 2018-09-12 DIAGNOSIS — C3490 Malignant neoplasm of unspecified part of unspecified bronchus or lung: Secondary | ICD-10-CM

## 2018-09-12 DIAGNOSIS — C787 Secondary malignant neoplasm of liver and intrahepatic bile duct: Secondary | ICD-10-CM | POA: Diagnosis not present

## 2018-09-12 MED ORDER — SODIUM CHLORIDE 0.9 % IV SOLN
Freq: Once | INTRAVENOUS | Status: AC
  Administered 2018-09-12: 09:00:00 via INTRAVENOUS
  Filled 2018-09-12: qty 250

## 2018-09-12 MED ORDER — HEPARIN SOD (PORK) LOCK FLUSH 100 UNIT/ML IV SOLN
500.0000 [IU] | Freq: Once | INTRAVENOUS | Status: AC | PRN
  Administered 2018-09-12: 500 [IU]
  Filled 2018-09-12: qty 5

## 2018-09-12 MED ORDER — PROCHLORPERAZINE MALEATE 10 MG PO TABS
10.0000 mg | ORAL_TABLET | Freq: Once | ORAL | Status: AC
  Administered 2018-09-12: 10 mg via ORAL
  Filled 2018-09-12: qty 1

## 2018-09-12 MED ORDER — TOPOTECAN HCL CHEMO INJECTION 4 MG
1.3000 mg/m2 | Freq: Once | INTRAVENOUS | Status: AC
  Administered 2018-09-12: 2.7 mg via INTRAVENOUS
  Filled 2018-09-12: qty 2.7

## 2018-09-13 ENCOUNTER — Inpatient Hospital Stay: Payer: Medicare Other

## 2018-09-13 ENCOUNTER — Other Ambulatory Visit: Payer: Self-pay

## 2018-09-13 VITALS — BP 119/77 | HR 85 | Resp 19

## 2018-09-13 DIAGNOSIS — Z5111 Encounter for antineoplastic chemotherapy: Secondary | ICD-10-CM | POA: Diagnosis not present

## 2018-09-13 DIAGNOSIS — C3490 Malignant neoplasm of unspecified part of unspecified bronchus or lung: Secondary | ICD-10-CM

## 2018-09-13 DIAGNOSIS — C787 Secondary malignant neoplasm of liver and intrahepatic bile duct: Secondary | ICD-10-CM | POA: Diagnosis not present

## 2018-09-13 DIAGNOSIS — C7951 Secondary malignant neoplasm of bone: Secondary | ICD-10-CM | POA: Diagnosis not present

## 2018-09-13 DIAGNOSIS — R5383 Other fatigue: Secondary | ICD-10-CM | POA: Diagnosis not present

## 2018-09-13 DIAGNOSIS — R531 Weakness: Secondary | ICD-10-CM | POA: Diagnosis not present

## 2018-09-13 MED ORDER — HEPARIN SOD (PORK) LOCK FLUSH 100 UNIT/ML IV SOLN
500.0000 [IU] | Freq: Once | INTRAVENOUS | Status: AC | PRN
  Administered 2018-09-13: 500 [IU]
  Filled 2018-09-13: qty 5

## 2018-09-13 MED ORDER — TOPOTECAN HCL CHEMO INJECTION 4 MG
1.3000 mg/m2 | Freq: Once | INTRAVENOUS | Status: AC
  Administered 2018-09-13: 2.7 mg via INTRAVENOUS
  Filled 2018-09-13: qty 2.7

## 2018-09-13 MED ORDER — PROCHLORPERAZINE MALEATE 10 MG PO TABS
10.0000 mg | ORAL_TABLET | Freq: Once | ORAL | Status: AC
  Administered 2018-09-13: 10 mg via ORAL
  Filled 2018-09-13: qty 1

## 2018-09-13 MED ORDER — SODIUM CHLORIDE 0.9 % IV SOLN
Freq: Once | INTRAVENOUS | Status: AC
  Administered 2018-09-13: 09:00:00 via INTRAVENOUS
  Filled 2018-09-13: qty 250

## 2018-09-14 ENCOUNTER — Other Ambulatory Visit: Payer: Self-pay

## 2018-09-14 ENCOUNTER — Inpatient Hospital Stay: Payer: Medicare Other

## 2018-09-14 VITALS — BP 116/79 | HR 94 | Temp 97.2°F | Resp 18

## 2018-09-14 DIAGNOSIS — C3490 Malignant neoplasm of unspecified part of unspecified bronchus or lung: Secondary | ICD-10-CM | POA: Diagnosis not present

## 2018-09-14 DIAGNOSIS — C7951 Secondary malignant neoplasm of bone: Secondary | ICD-10-CM | POA: Diagnosis not present

## 2018-09-14 DIAGNOSIS — R5383 Other fatigue: Secondary | ICD-10-CM | POA: Diagnosis not present

## 2018-09-14 DIAGNOSIS — Z5111 Encounter for antineoplastic chemotherapy: Secondary | ICD-10-CM | POA: Diagnosis not present

## 2018-09-14 DIAGNOSIS — C787 Secondary malignant neoplasm of liver and intrahepatic bile duct: Secondary | ICD-10-CM | POA: Diagnosis not present

## 2018-09-14 DIAGNOSIS — R531 Weakness: Secondary | ICD-10-CM | POA: Diagnosis not present

## 2018-09-14 MED ORDER — SODIUM CHLORIDE 0.9 % IV SOLN
Freq: Once | INTRAVENOUS | Status: AC
  Administered 2018-09-14: 10:00:00 via INTRAVENOUS
  Filled 2018-09-14: qty 250

## 2018-09-14 MED ORDER — HEPARIN SOD (PORK) LOCK FLUSH 100 UNIT/ML IV SOLN
500.0000 [IU] | Freq: Once | INTRAVENOUS | Status: AC | PRN
  Administered 2018-09-14: 11:00:00 500 [IU]
  Filled 2018-09-14: qty 5

## 2018-09-14 MED ORDER — PROCHLORPERAZINE MALEATE 10 MG PO TABS
10.0000 mg | ORAL_TABLET | Freq: Once | ORAL | Status: AC
  Administered 2018-09-14: 10:00:00 10 mg via ORAL
  Filled 2018-09-14: qty 1

## 2018-09-14 MED ORDER — TOPOTECAN HCL CHEMO INJECTION 4 MG
1.3000 mg/m2 | Freq: Once | INTRAVENOUS | Status: AC
  Administered 2018-09-14: 10:00:00 2.7 mg via INTRAVENOUS
  Filled 2018-09-14: qty 2.7

## 2018-10-02 DIAGNOSIS — M9901 Segmental and somatic dysfunction of cervical region: Secondary | ICD-10-CM | POA: Diagnosis not present

## 2018-10-02 DIAGNOSIS — M5417 Radiculopathy, lumbosacral region: Secondary | ICD-10-CM | POA: Diagnosis not present

## 2018-10-02 DIAGNOSIS — M9903 Segmental and somatic dysfunction of lumbar region: Secondary | ICD-10-CM | POA: Diagnosis not present

## 2018-10-02 DIAGNOSIS — M50122 Cervical disc disorder at C5-C6 level with radiculopathy: Secondary | ICD-10-CM | POA: Diagnosis not present

## 2018-10-04 ENCOUNTER — Ambulatory Visit
Admission: RE | Admit: 2018-10-04 | Discharge: 2018-10-04 | Disposition: A | Discharge status: Home / Self Care | Payer: Medicare Other | Source: Ambulatory Visit | Attending: Oncology | Admitting: Oncology

## 2018-10-04 ENCOUNTER — Other Ambulatory Visit: Payer: Self-pay

## 2018-10-04 DIAGNOSIS — C3490 Malignant neoplasm of unspecified part of unspecified bronchus or lung: Secondary | ICD-10-CM | POA: Diagnosis not present

## 2018-10-04 DIAGNOSIS — C3492 Malignant neoplasm of unspecified part of left bronchus or lung: Secondary | ICD-10-CM | POA: Diagnosis not present

## 2018-10-04 DIAGNOSIS — R918 Other nonspecific abnormal finding of lung field: Secondary | ICD-10-CM | POA: Diagnosis not present

## 2018-10-04 MED ORDER — IOHEXOL 300 MG/ML  SOLN
100.0000 mL | Freq: Once | INTRAMUSCULAR | Status: AC | PRN
  Administered 2018-10-04: 09:00:00 100 mL via INTRAVENOUS

## 2018-10-05 ENCOUNTER — Other Ambulatory Visit: Payer: Self-pay

## 2018-10-05 NOTE — Progress Notes (Signed)
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  INTERVAL HISTORY: Patient returns to clinic today for further evaluation, discussion of her imaging results, and consideration of cycle 11 of topotecan.  She continues to have chronic weakness and fatigue.  She continues to have chronic pain, but this is well controlled on her current dose of MS Contin.  She otherwise feels well. She has no neurologic complaints.  She denies any recent fevers or illnesses.  She has a good appetite and denies weight loss.  She has chronic shortness of breath, but denies any chest pain, cough, or hemoptysis. She has no abdominal pain.  She denies any nausea, vomiting, constipation, or diarrhea.  She denies any melena or hematochezia.  She has no urinary complaints.  Patient offers no further specific complaints today.  REVIEW OF SYSTEMS:   Review of Systems  Constitutional: Positive for malaise/fatigue. Negative for fever and weight loss.  Respiratory: Positive for shortness of breath. Negative for cough, hemoptysis and wheezing.   Cardiovascular: Negative.  Negative for chest pain and leg swelling.  Gastrointestinal: Positive for nausea. Negative for abdominal pain, blood in stool, diarrhea, melena and vomiting.  Genitourinary: Negative.  Negative for dysuria and frequency.  Musculoskeletal: Positive for back pain, joint pain and neck pain.  Skin: Negative.  Negative for rash.  Neurological: Positive for weakness. Negative for sensory change, focal weakness and headaches.  Psychiatric/Behavioral: Negative.  Negative for memory loss. The patient is not nervous/anxious.     As per HPI. Otherwise, a complete review of  systems is negative.  PAST MEDICAL HISTORY: Past Medical History:  Diagnosis Date   Anxiety    Arthritis    Asthma    COPD   CAP (community acquired pneumonia) 06/30/2014   Cardiomyopathy ([place])    nonischemic (EF 35-40%)   CHF (congestive heart failure) (HCC)    COPD (chronic obstructive pulmonary disease) (HCC)    Depression    Dyspnea    GERD (gastroesophageal reflux disease)    Hyperlipidemia    Hypertension    Pneumonia    PVC's (premature ventricular contractions)    states 10 years ago   Sepsis ([place]) 06/30/2014   Small cell lung cancer ([place]) 12/2016   Chemo tx's.   SOB (shortness of breath) 05/08/2014   Spinal cord injury at T7-T12 level [facility])    2016    PAST SURGICAL HISTORY: Past Surgical History:  Procedure Laterality Date   APPENDECTOMY     CARDIAC CATHETERIZATION     CATARACT EXTRACTION     right eye    CESAREAN SECTION     COLONOSCOPY     LAMINOTOMY     LUMBAR LAMINECTOMY/DECOMPRESSION MICRODISCECTOMY Left 08/01/2016   Procedure: Left Lumbar Four-Five Laminectomy and Foraminotomy;  Surgeon: Earnie Larsson, MD;  Location: [place];  Service: Neurosurgery;  Laterality: Left;   PORTA CATH INSERTION N/A 02/03/2017   Procedure: PORTA CATH INSERTION;  Surgeon: Katha Cabal, MD;  Location: [place] [facility];  Service: Cardiovascular;  Laterality: N/A;    FAMILY HISTORY: Family History  Problem Relation Age of Onset   Heart attack Father 10       MI x 3     ADVANCED DIRECTIVES (Y/N):  N  HEALTH MAINTENANCE: Social History   Tobacco Use   Smoking status: Former Smoker    Packs/day: 0.50    Years: 50.00    Pack years: 25.00    Types: Cigarettes    Quit date: 01/27/2015    Years since quitting: 3.6   Smokeless tobacco: Never Used  Substance Use Topics   Alcohol use: No   Drug use: No     Colonoscopy:  PAP:  Bone density:  Lipid panel:  Allergies  Allergen Reactions   Fish Allergy Anaphylaxis    Per pt  after eating [facility] she had throat swelling and SOB. MSY  Per pt after eating [facility] she had throat swelling and SOB. MSY    Fentanyl     Overly sedated and groggy, ineffective     Current Outpatient Medications  Medication Sig Dispense Refill   ADVAIR DISKUS 250-50 MCG/DOSE AEPB Inhale 1 puff into the lungs 2 (two) times daily.     albuterol (PROAIR HFA) 108 (90 Base) MCG/ACT inhaler Inhale 2 puffs into the lungs every 6 (six) hours as needed for wheezing or shortness of breath.      alendronate (FOSAMAX) 70 MG tablet Take 70 mg by mouth every Thursday.      ARIPiprazole (ABILIFY) 5 MG tablet Take 5 mg by mouth daily.     BELBUCA 150 MCG FILM Take 1 tablet by mouth daily.  0   benzonatate (TESSALON) 100 MG capsule Take 1 capsule (100 mg total) by mouth every 8 (eight) hours as needed for cough. 20 capsule 0   dextromethorphan-guaiFENesin (MUCINEX DM) 30-600 MG 12hr tablet Take 1 tablet by mouth 2 (two) times daily as needed for cough.     diphenoxylate-atropine (LOMOTIL) 2.5-0.025 MG tablet Take 1 tablet by mouth 4 (four) times daily as needed for diarrhea or loose stools. 30 tablet 0   DULoxetine (CYMBALTA) 60 MG capsule Take 60 mg by mouth daily.     ipratropium-albuterol (DUONEB) 0.5-2.5 (3) MG/3ML SOLN Take 3 mLs by nebulization every 4 (four) hours as needed. 360 mL 1   LORazepam (ATIVAN) 0.5 MG tablet Take 0.5 mg by mouth as needed.     metoprolol succinate (TOPROL-XL) 100 MG 24 hr tablet Take 1 tablet by mouth 1 day or 1 dose.     morphine (MS CONTIN) 30 MG 12 hr tablet Take 1 tablet (30 mg total) by mouth every 8 (eight) hours. 90 tablet 0   pantoprazole (PROTONIX) 40 MG tablet Take 1 tablet by mouth daily.     potassium chloride SA (K-DUR,KLOR-CON) 20 MEQ tablet Take 1 tablet (20 mEq total) by mouth daily. 30 tablet 2   prochlorperazine (COMPAZINE) 10 MG tablet Take 1 tablet (10 mg total) by mouth every 6 (six) hours as needed (Nausea or vomiting). 60 tablet 2     promethazine (PHENERGAN) 25 MG tablet Take 1 tablet (25 mg total) by mouth every 8 (eight) hours as needed for nausea or vomiting. 30 tablet 2   sacubitril-valsartan (ENTRESTO) 24-26 MG Take 1 tablet by mouth 2 (two) times daily. 180 tablet 3   tiZANidine (ZANAFLEX) 4 MG tablet Take 1 tablet (4 mg total) by mouth every 8 (eight) hours as needed for muscle spasms. 90 tablet 2   No current facility-administered medications for this visit.    Facility-Administered Medications Ordered in Other Visits  Medication Dose Route Frequency Provider Last Rate Last Dose   sodium chloride flush (NS) 0.9 % injection 10 mL  10 mL Intravenous  PRN Lloyd Huger, MD   10 mL at 03/01/17 0841    OBJECTIVE: Vitals:   10/08/18 0853  BP: 103/70  Pulse: 79  Resp: 16  Temp: (!) 97.4 F (36.3 C)     Body mass index is 34.11 kg/m.    ECOG FS:1 - Symptomatic but completely ambulatory  General: Well-developed, well-nourished, no acute distress.  Sitting in a wheelchair. Eyes: Pink conjunctiva, anicteric sclera. HEENT: Normocephalic, moist mucous membranes, clear oropharnyx. Lungs: Clear to auscultation bilaterally. Heart: Regular rate and rhythm. No rubs, murmurs, or gallops. Abdomen: Soft, nontender, nondistended. No organomegaly noted, normoactive bowel sounds. Musculoskeletal: No edema, cyanosis, or clubbing. Neuro: Alert, answering all questions appropriately. Cranial nerves grossly intact. Skin: No rashes or petechiae noted. Psych: Normal affect.  LAB RESULTS:  Lab Results  Component Value Date   NA 137 10/08/2018   K 3.2 (L) 10/08/2018   CL 103 10/08/2018   CO2 25 10/08/2018   GLUCOSE 135 (H) 10/08/2018   BUN 12 10/08/2018   CREATININE 0.71 10/08/2018   CALCIUM 8.2 (L) 10/08/2018   PROT 7.9 10/08/2018   ALBUMIN 3.3 (L) 10/08/2018   AST 30 10/08/2018   ALT 29 10/08/2018   ALKPHOS 72 10/08/2018   BILITOT 0.4 10/08/2018   GFRNONAA >60 10/08/2018   GFRAA >60 10/08/2018    Lab  Results  Component Value Date   WBC 6.5 10/08/2018   NEUTROABS 4.2 10/08/2018   HGB 9.1 (L) 10/08/2018   HCT 30.1 (L) 10/08/2018   MCV 93.2 10/08/2018   PLT 283 10/08/2018     STUDIES: Ct Chest W Contrast  Result Date: 10/04/2018 CLINICAL DATA:  Stage IV small-cell lung lung cancer. EXAM: CT CHEST, ABDOMEN, AND PELVIS WITH CONTRAST TECHNIQUE: Multidetector CT imaging of the chest, abdomen and pelvis was performed following the standard protocol during bolus administration of intravenous contrast. CONTRAST:  183mL OMNIPAQUE IOHEXOL 300 MG/ML  SOLN COMPARISON:  06/15/2018 FINDINGS: CT CHEST FINDINGS Cardiovascular: The heart size is normal. No substantial pericardial effusion. Coronary artery calcification is evident. Atherosclerotic calcification is noted in the wall of the thoracic aorta. Right Port-A-Cath tip is positioned in the distal SVC. Mediastinum/Nodes: No mediastinal lymphadenopathy. 10 mm short axis right hilar lymph node measured previously is 9 mm short axis today. Tiny left hilar node is stable. Contrast material in the esophagus may be related to dysmotility or reflux. There is no axillary lymphadenopathy. Lungs/Pleura: Centrilobular emphsyema noted. Left suprahilar nodule measured previously at 2.1 cm is now 2.0 cm (48/3). 6 x 11 mm mixed attenuation nodule in the right lower lobe (71/3) is stable. 6 x 7 mm peripheral right lower lobe nodule in the right lower lobe on 93/3 is unchanged. Left lower lobe 7 mm subpleural nodule is stable. No new suspicious nodule or mass. No focal consolidation. No pleural effusion. Musculoskeletal: Multiple sclerotic thoracic spine lesions are similar nonacute bilateral rib fractures evident. Sclerotic lesion inferior right scapula is stable. CT ABDOMEN PELVIS FINDINGS Hepatobiliary: 3.1 x 2.6 cm medial segment left liver metastasis measured previously is now 3.0 x 2.5 cm. 2.5 x 1.9 cm lesion in the lateral segment left liver measured previously is now 1.9  x 1.7 cm. No definite new liver lesion. There is no evidence for gallstones, gallbladder wall thickening, or pericholecystic fluid. Mild extrahepatic biliary duct dilatation is stable. Pancreas: No focal mass lesion. No dilatation of the main duct. No intraparenchymal cyst. No peripancreatic edema. Spleen: No splenomegaly. No focal mass lesion. Adrenals/Urinary Tract: No adrenal nodule or  mass. Kidneys unremarkable. No evidence for hydroureter. The urinary bladder appears normal for the degree of distention. Stomach/Bowel: Stomach is unremarkable. No gastric wall thickening. No evidence of outlet obstruction. Duodenum is normally positioned as is the ligament of Treitz. No small bowel wall thickening. No small bowel dilatation. The terminal ileum is normal. The appendix is not visualized, but there is no edema or inflammation in the region of the cecum. No gross colonic mass. No colonic wall thickening. Vascular/Lymphatic: There is abdominal aortic atherosclerosis without aneurysm. There is no gastrohepatic or hepatoduodenal ligament lymphadenopathy. No intraperitoneal or retroperitoneal lymphadenopathy. No pelvic sidewall lymphadenopathy. Reproductive: The uterus is unremarkable.  There is no adnexal mass. Other: No intraperitoneal free fluid. Musculoskeletal: Sclerotic lesions noted in the femoral heads may reflect a vascular necrosis with metastatic disease a less likely consideration. Similar appearance of sclerotic lesions posterior iliac bones bilaterally. Multilevel compression fractures in the thoracic spine is stable. Right pars defect at L4 with spondylolisthesis at L4-5 is stable. IMPRESSION: 1. Stable exam.  No new or progressive interval findings. 2. Bilateral pulmonary nodules, including dominant left suprahilar 2 cm nodule show no substantial change. 3. Similar appearance of sclerotic bone metastases. 4. Similar appearance of liver metastases. 5. Similar appearance of extrahepatic biliary duct  dilatation. 6.  Aortic Atherosclerois (ICD10-170.0) Electronically Signed   By: Misty Stanley M.D.   On: 10/04/2018 11:37   Ct Abdomen Pelvis W Contrast  Result Date: 10/04/2018 CLINICAL DATA:  Stage IV small-cell lung lung cancer. EXAM: CT CHEST, ABDOMEN, AND PELVIS WITH CONTRAST TECHNIQUE: Multidetector CT imaging of the chest, abdomen and pelvis was performed following the standard protocol during bolus administration of intravenous contrast. CONTRAST:  157mL OMNIPAQUE IOHEXOL 300 MG/ML  SOLN COMPARISON:  06/15/2018 FINDINGS: CT CHEST FINDINGS Cardiovascular: The heart size is normal. No substantial pericardial effusion. Coronary artery calcification is evident. Atherosclerotic calcification is noted in the wall of the thoracic aorta. Right Port-A-Cath tip is positioned in the distal SVC. Mediastinum/Nodes: No mediastinal lymphadenopathy. 10 mm short axis right hilar lymph node measured previously is 9 mm short axis today. Tiny left hilar node is stable. Contrast material in the esophagus may be related to dysmotility or reflux. There is no axillary lymphadenopathy. Lungs/Pleura: Centrilobular emphsyema noted. Left suprahilar nodule measured previously at 2.1 cm is now 2.0 cm (48/3). 6 x 11 mm mixed attenuation nodule in the right lower lobe (71/3) is stable. 6 x 7 mm peripheral right lower lobe nodule in the right lower lobe on 93/3 is unchanged. Left lower lobe 7 mm subpleural nodule is stable. No new suspicious nodule or mass. No focal consolidation. No pleural effusion. Musculoskeletal: Multiple sclerotic thoracic spine lesions are similar nonacute bilateral rib fractures evident. Sclerotic lesion inferior right scapula is stable. CT ABDOMEN PELVIS FINDINGS Hepatobiliary: 3.1 x 2.6 cm medial segment left liver metastasis measured previously is now 3.0 x 2.5 cm. 2.5 x 1.9 cm lesion in the lateral segment left liver measured previously is now 1.9 x 1.7 cm. No definite new liver lesion. There is no evidence  for gallstones, gallbladder wall thickening, or pericholecystic fluid. Mild extrahepatic biliary duct dilatation is stable. Pancreas: No focal mass lesion. No dilatation of the main duct. No intraparenchymal cyst. No peripancreatic edema. Spleen: No splenomegaly. No focal mass lesion. Adrenals/Urinary Tract: No adrenal nodule or mass. Kidneys unremarkable. No evidence for hydroureter. The urinary bladder appears normal for the degree of distention. Stomach/Bowel: Stomach is unremarkable. No gastric wall thickening. No evidence of outlet obstruction. Duodenum is  normally positioned as is the ligament of Treitz. No small bowel wall thickening. No small bowel dilatation. The terminal ileum is normal. The appendix is not visualized, but there is no edema or inflammation in the region of the cecum. No gross colonic mass. No colonic wall thickening. Vascular/Lymphatic: There is abdominal aortic atherosclerosis without aneurysm. There is no gastrohepatic or hepatoduodenal ligament lymphadenopathy. No intraperitoneal or retroperitoneal lymphadenopathy. No pelvic sidewall lymphadenopathy. Reproductive: The uterus is unremarkable.  There is no adnexal mass. Other: No intraperitoneal free fluid. Musculoskeletal: Sclerotic lesions noted in the femoral heads may reflect a vascular necrosis with metastatic disease a less likely consideration. Similar appearance of sclerotic lesions posterior iliac bones bilaterally. Multilevel compression fractures in the thoracic spine is stable. Right pars defect at L4 with spondylolisthesis at L4-5 is stable. IMPRESSION: 1. Stable exam.  No new or progressive interval findings. 2. Bilateral pulmonary nodules, including dominant left suprahilar 2 cm nodule show no substantial change. 3. Similar appearance of sclerotic bone metastases. 4. Similar appearance of liver metastases. 5. Similar appearance of extrahepatic biliary duct dilatation. 6.  Aortic Atherosclerois (ICD10-170.0) Electronically  Signed   By: Misty Stanley M.D.   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  PLAN:  1. 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  Patient then received maintenance Tecentriq from Jun 21, 2017 through November 15, 2017.  CT scan results from December 25, 2017 reviewed independently with progressive disease in her mediastinum as well as significant progression of multifocal hepatic disease.  Hospice was briefly discussed, but patient wished to continue aggressive treatment.  Patient was initially scheduled to get topotecan on days 1 through 5 every 21 days, but given her persistent thrombocytopenia she can only receive treatment every 28 days.  Patient's most recent imaging on October 04, 2018 reviewed independently and reported as above with no new or progressive findings.  Proceed with cycle 11, day 1 of dose reduced topotecan today.  Return to clinic Tuesday through Friday for topotecan only.  Patient return to clinic in 4 weeks for further evaluation and consideration of cycle 12. 2. Pain: Continue MS Contin to 30 mg every 8 hours. Patient states she has "fired" her pain doctor.  Patient was given a prescription today. 3. Thrombocytopenia: Resolved.  Proceed with treatment every 28 days as above. 4.  Anemia: Hemoglobin has trended down slightly to 9.1.  Monitor. 5.  Neutropenia: Resolved.   6.  COPD: Chronic and unchanged.  Continue outpatient palliative care.  Continue current medications as prescribed. 7.  Hypokalemia: Mild.  Potassium 3.2 today.  Continue oral potassium supplementation. 8.  Disposition: Hospice and end-of-life care were previously discussed, but patient wishes to continue aggressive treatments.  She expressed understanding that her treatment options are limited.  She confirms  that she is a DNR and is having outpatient palliative care visit.   Patient expressed understanding and was in agreement with this plan. She also understands that She can call clinic at any time with any questions, concerns, or complaints.   Cancer Staging Small cell lung cancer [facility]) Staging form: Lung, AJCC 8th Edition - Clinical stage from 01/28/2017: Stage IV (cT4, cN3, pM1c) - Signed by Lloyd Huger, MD on 01/28/2017   Lloyd Huger, MD   10/08/2018 1:49 PM

## 2018-10-08 ENCOUNTER — Inpatient Hospital Stay: Payer: Medicare Other

## 2018-10-08 ENCOUNTER — Inpatient Hospital Stay: Payer: Medicare Other | Attending: Oncology

## 2018-10-08 ENCOUNTER — Inpatient Hospital Stay (HOSPITAL_BASED_OUTPATIENT_CLINIC_OR_DEPARTMENT_OTHER): Payer: Medicare Other | Admitting: Oncology

## 2018-10-08 ENCOUNTER — Other Ambulatory Visit: Payer: Self-pay

## 2018-10-08 ENCOUNTER — Encounter: Payer: Self-pay | Admitting: Oncology

## 2018-10-08 VITALS — BP 103/70 | HR 79 | Temp 97.4°F | Resp 16 | Wt 205.0 lb

## 2018-10-08 DIAGNOSIS — C3490 Malignant neoplasm of unspecified part of unspecified bronchus or lung: Secondary | ICD-10-CM | POA: Diagnosis not present

## 2018-10-08 DIAGNOSIS — M9903 Segmental and somatic dysfunction of lumbar region: Secondary | ICD-10-CM | POA: Diagnosis not present

## 2018-10-08 DIAGNOSIS — C787 Secondary malignant neoplasm of liver and intrahepatic bile duct: Secondary | ICD-10-CM | POA: Diagnosis not present

## 2018-10-08 DIAGNOSIS — C3431 Malignant neoplasm of lower lobe, right bronchus or lung: Secondary | ICD-10-CM | POA: Insufficient documentation

## 2018-10-08 DIAGNOSIS — Z5111 Encounter for antineoplastic chemotherapy: Secondary | ICD-10-CM | POA: Diagnosis not present

## 2018-10-08 DIAGNOSIS — F419 Anxiety disorder, unspecified: Secondary | ICD-10-CM | POA: Insufficient documentation

## 2018-10-08 DIAGNOSIS — J449 Chronic obstructive pulmonary disease, unspecified: Secondary | ICD-10-CM | POA: Insufficient documentation

## 2018-10-08 DIAGNOSIS — E785 Hyperlipidemia, unspecified: Secondary | ICD-10-CM | POA: Diagnosis not present

## 2018-10-08 DIAGNOSIS — M9901 Segmental and somatic dysfunction of cervical region: Secondary | ICD-10-CM | POA: Diagnosis not present

## 2018-10-08 DIAGNOSIS — E876 Hypokalemia: Secondary | ICD-10-CM | POA: Diagnosis not present

## 2018-10-08 DIAGNOSIS — I10 Essential (primary) hypertension: Secondary | ICD-10-CM | POA: Insufficient documentation

## 2018-10-08 DIAGNOSIS — Z7951 Long term (current) use of inhaled steroids: Secondary | ICD-10-CM | POA: Diagnosis not present

## 2018-10-08 DIAGNOSIS — M50122 Cervical disc disorder at C5-C6 level with radiculopathy: Secondary | ICD-10-CM | POA: Diagnosis not present

## 2018-10-08 DIAGNOSIS — D649 Anemia, unspecified: Secondary | ICD-10-CM | POA: Diagnosis not present

## 2018-10-08 DIAGNOSIS — C7951 Secondary malignant neoplasm of bone: Secondary | ICD-10-CM | POA: Insufficient documentation

## 2018-10-08 DIAGNOSIS — Z87891 Personal history of nicotine dependence: Secondary | ICD-10-CM | POA: Diagnosis not present

## 2018-10-08 DIAGNOSIS — Z79899 Other long term (current) drug therapy: Secondary | ICD-10-CM | POA: Insufficient documentation

## 2018-10-08 DIAGNOSIS — M5417 Radiculopathy, lumbosacral region: Secondary | ICD-10-CM | POA: Diagnosis not present

## 2018-10-08 LAB — CBC WITH DIFFERENTIAL/PLATELET
Abs Immature Granulocytes: 0.02 10*3/uL (ref 0.00–0.07)
Basophils Absolute: 0 10*3/uL (ref 0.0–0.1)
Basophils Relative: 0 %
Eosinophils Absolute: 0 10*3/uL (ref 0.0–0.5)
Eosinophils Relative: 0 %
HCT: 30.1 % — ABNORMAL LOW (ref 36.0–46.0)
Hemoglobin: 9.1 g/dL — ABNORMAL LOW (ref 12.0–15.0)
Immature Granulocytes: 0 %
Lymphocytes Relative: 22 %
Lymphs Abs: 1.4 10*3/uL (ref 0.7–4.0)
MCH: 28.2 pg (ref 26.0–34.0)
MCHC: 30.2 g/dL (ref 30.0–36.0)
MCV: 93.2 fL (ref 80.0–100.0)
Monocytes Absolute: 0.8 10*3/uL (ref 0.1–1.0)
Monocytes Relative: 12 %
Neutro Abs: 4.2 10*3/uL (ref 1.7–7.7)
Neutrophils Relative %: 66 %
Platelets: 283 10*3/uL (ref 150–400)
RBC: 3.23 MIL/uL — ABNORMAL LOW (ref 3.87–5.11)
RDW: 19.2 % — ABNORMAL HIGH (ref 11.5–15.5)
WBC: 6.5 10*3/uL (ref 4.0–10.5)
nRBC: 0 % (ref 0.0–0.2)

## 2018-10-08 LAB — COMPREHENSIVE METABOLIC PANEL
ALT: 29 U/L (ref 0–44)
AST: 30 U/L (ref 15–41)
Albumin: 3.3 g/dL — ABNORMAL LOW (ref 3.5–5.0)
Alkaline Phosphatase: 72 U/L (ref 38–126)
Anion gap: 9 (ref 5–15)
BUN: 12 mg/dL (ref 8–23)
CO2: 25 mmol/L (ref 22–32)
Calcium: 8.2 mg/dL — ABNORMAL LOW (ref 8.9–10.3)
Chloride: 103 mmol/L (ref 98–111)
Creatinine, Ser: 0.71 mg/dL (ref 0.44–1.00)
GFR calc Af Amer: >60 mL/min (ref >60)
GFR calc non Af Amer: >60 mL/min (ref >60)
Glucose, Bld: 135 mg/dL — ABNORMAL HIGH (ref 70–99)
Potassium: 3.2 mmol/L — ABNORMAL LOW (ref 3.5–5.1)
Sodium: 137 mmol/L (ref 135–145)
Total Bilirubin: 0.4 mg/dL (ref 0.3–1.2)
Total Protein: 7.9 g/dL (ref 6.5–8.1)

## 2018-10-08 LAB — SAMPLE TO BLOOD BANK

## 2018-10-08 MED ORDER — MORPHINE SULFATE ER 30 MG PO TBCR: 30.0000 mg | EXTENDED_RELEASE_TABLET | Freq: Three times a day (TID) | ORAL | 0 refills | Status: DC

## 2018-10-08 MED ORDER — TOPOTECAN HCL CHEMO INJECTION 4 MG
1.3000 mg/m2 | Freq: Once | INTRAVENOUS | Status: AC
  Administered 2018-10-08: 2.7 mg via INTRAVENOUS
  Filled 2018-10-08: qty 2.7

## 2018-10-08 MED ORDER — PROCHLORPERAZINE MALEATE 10 MG PO TABS
10.0000 mg | ORAL_TABLET | Freq: Once | ORAL | Status: AC
  Administered 2018-10-08: 10 mg via ORAL
  Filled 2018-10-08: qty 1

## 2018-10-08 MED ORDER — HEPARIN SOD (PORK) LOCK FLUSH 100 UNIT/ML IV SOLN
500.0000 [IU] | Freq: Once | INTRAVENOUS | Status: AC | PRN
  Administered 2018-10-08: 500 [IU]
  Filled 2018-10-08: qty 5

## 2018-10-08 MED ORDER — SODIUM CHLORIDE 0.9 % IV SOLN
Freq: Once | INTRAVENOUS | Status: AC
  Administered 2018-10-08: 10:00:00 via INTRAVENOUS
  Filled 2018-10-08: qty 250

## 2018-10-08 NOTE — Progress Notes (Signed)
Patient is here for follow up, she is doing well does mention lower back and left shoulder pain.

## 2018-10-09 ENCOUNTER — Other Ambulatory Visit: Payer: Self-pay

## 2018-10-09 ENCOUNTER — Inpatient Hospital Stay: Payer: Medicare Other

## 2018-10-09 VITALS — BP 98/64 | HR 64 | Temp 98.6°F | Resp 20

## 2018-10-09 DIAGNOSIS — D649 Anemia, unspecified: Secondary | ICD-10-CM | POA: Diagnosis not present

## 2018-10-09 DIAGNOSIS — C3490 Malignant neoplasm of unspecified part of unspecified bronchus or lung: Secondary | ICD-10-CM

## 2018-10-09 DIAGNOSIS — E785 Hyperlipidemia, unspecified: Secondary | ICD-10-CM | POA: Diagnosis not present

## 2018-10-09 DIAGNOSIS — C7951 Secondary malignant neoplasm of bone: Secondary | ICD-10-CM | POA: Diagnosis not present

## 2018-10-09 DIAGNOSIS — C787 Secondary malignant neoplasm of liver and intrahepatic bile duct: Secondary | ICD-10-CM | POA: Diagnosis not present

## 2018-10-09 DIAGNOSIS — Z5111 Encounter for antineoplastic chemotherapy: Secondary | ICD-10-CM | POA: Diagnosis not present

## 2018-10-09 DIAGNOSIS — C3431 Malignant neoplasm of lower lobe, right bronchus or lung: Secondary | ICD-10-CM | POA: Diagnosis not present

## 2018-10-09 MED ORDER — SODIUM CHLORIDE 0.9 % IV SOLN
Freq: Once | INTRAVENOUS | Status: AC
  Administered 2018-10-09: 10:00:00 via INTRAVENOUS
  Filled 2018-10-09: qty 250

## 2018-10-09 MED ORDER — TOPOTECAN HCL CHEMO INJECTION 4 MG
1.3000 mg/m2 | Freq: Once | INTRAVENOUS | Status: AC
  Administered 2018-10-09: 2.7 mg via INTRAVENOUS
  Filled 2018-10-09: qty 2.7

## 2018-10-09 MED ORDER — HEPARIN SOD (PORK) LOCK FLUSH 100 UNIT/ML IV SOLN
500.0000 [IU] | Freq: Once | INTRAVENOUS | Status: AC | PRN
  Administered 2018-10-09: 500 [IU]
  Filled 2018-10-09: qty 5

## 2018-10-09 MED ORDER — PROCHLORPERAZINE MALEATE 10 MG PO TABS
10.0000 mg | ORAL_TABLET | Freq: Once | ORAL | Status: AC
  Administered 2018-10-09: 10:00:00 10 mg via ORAL
  Filled 2018-10-09: qty 1

## 2018-10-09 MED ORDER — SODIUM CHLORIDE 0.9% FLUSH
10.0000 mL | INTRAVENOUS | Status: DC | PRN
  Administered 2018-10-09: 10:00:00 10 mL
  Filled 2018-10-09: qty 10

## 2018-10-10 ENCOUNTER — Inpatient Hospital Stay: Payer: Medicare Other

## 2018-10-10 ENCOUNTER — Other Ambulatory Visit: Payer: Self-pay

## 2018-10-10 VITALS — BP 98/73 | HR 71 | Temp 97.7°F | Resp 20

## 2018-10-10 DIAGNOSIS — C787 Secondary malignant neoplasm of liver and intrahepatic bile duct: Secondary | ICD-10-CM | POA: Diagnosis not present

## 2018-10-10 DIAGNOSIS — D649 Anemia, unspecified: Secondary | ICD-10-CM | POA: Diagnosis not present

## 2018-10-10 DIAGNOSIS — E785 Hyperlipidemia, unspecified: Secondary | ICD-10-CM | POA: Diagnosis not present

## 2018-10-10 DIAGNOSIS — Z5111 Encounter for antineoplastic chemotherapy: Secondary | ICD-10-CM | POA: Diagnosis not present

## 2018-10-10 DIAGNOSIS — C3431 Malignant neoplasm of lower lobe, right bronchus or lung: Secondary | ICD-10-CM | POA: Diagnosis not present

## 2018-10-10 DIAGNOSIS — C7951 Secondary malignant neoplasm of bone: Secondary | ICD-10-CM | POA: Diagnosis not present

## 2018-10-10 DIAGNOSIS — C3490 Malignant neoplasm of unspecified part of unspecified bronchus or lung: Secondary | ICD-10-CM

## 2018-10-10 MED ORDER — PROCHLORPERAZINE MALEATE 10 MG PO TABS
10.0000 mg | ORAL_TABLET | Freq: Once | ORAL | Status: AC
  Administered 2018-10-10: 10 mg via ORAL
  Filled 2018-10-10: qty 1

## 2018-10-10 MED ORDER — SODIUM CHLORIDE 0.9 % IV SOLN
Freq: Once | INTRAVENOUS | Status: AC
  Administered 2018-10-10: 10:00:00 via INTRAVENOUS
  Filled 2018-10-10: qty 250

## 2018-10-10 MED ORDER — TOPOTECAN HCL CHEMO INJECTION 4 MG
1.3000 mg/m2 | Freq: Once | INTRAVENOUS | Status: AC
  Administered 2018-10-10: 10:00:00 2.7 mg via INTRAVENOUS
  Filled 2018-10-10: qty 2.7

## 2018-10-10 MED ORDER — HEPARIN SOD (PORK) LOCK FLUSH 100 UNIT/ML IV SOLN
500.0000 [IU] | Freq: Once | INTRAVENOUS | Status: AC | PRN
  Administered 2018-10-10: 11:00:00 500 [IU]
  Filled 2018-10-10: qty 5

## 2018-10-11 ENCOUNTER — Inpatient Hospital Stay: Payer: Medicare Other

## 2018-10-11 ENCOUNTER — Other Ambulatory Visit: Payer: Self-pay

## 2018-10-11 VITALS — BP 112/74 | HR 58 | Resp 18

## 2018-10-11 DIAGNOSIS — M9903 Segmental and somatic dysfunction of lumbar region: Secondary | ICD-10-CM | POA: Diagnosis not present

## 2018-10-11 DIAGNOSIS — M50122 Cervical disc disorder at C5-C6 level with radiculopathy: Secondary | ICD-10-CM | POA: Diagnosis not present

## 2018-10-11 DIAGNOSIS — C7951 Secondary malignant neoplasm of bone: Secondary | ICD-10-CM | POA: Diagnosis not present

## 2018-10-11 DIAGNOSIS — M9901 Segmental and somatic dysfunction of cervical region: Secondary | ICD-10-CM | POA: Diagnosis not present

## 2018-10-11 DIAGNOSIS — Z5111 Encounter for antineoplastic chemotherapy: Secondary | ICD-10-CM | POA: Diagnosis not present

## 2018-10-11 DIAGNOSIS — C3431 Malignant neoplasm of lower lobe, right bronchus or lung: Secondary | ICD-10-CM | POA: Diagnosis not present

## 2018-10-11 DIAGNOSIS — M5417 Radiculopathy, lumbosacral region: Secondary | ICD-10-CM | POA: Diagnosis not present

## 2018-10-11 DIAGNOSIS — E785 Hyperlipidemia, unspecified: Secondary | ICD-10-CM | POA: Diagnosis not present

## 2018-10-11 DIAGNOSIS — C3490 Malignant neoplasm of unspecified part of unspecified bronchus or lung: Secondary | ICD-10-CM

## 2018-10-11 DIAGNOSIS — D649 Anemia, unspecified: Secondary | ICD-10-CM | POA: Diagnosis not present

## 2018-10-11 DIAGNOSIS — C787 Secondary malignant neoplasm of liver and intrahepatic bile duct: Secondary | ICD-10-CM | POA: Diagnosis not present

## 2018-10-11 MED ORDER — PROCHLORPERAZINE MALEATE 10 MG PO TABS
10.0000 mg | ORAL_TABLET | Freq: Once | ORAL | Status: AC
  Administered 2018-10-11: 09:00:00 10 mg via ORAL
  Filled 2018-10-11: qty 1

## 2018-10-11 MED ORDER — SODIUM CHLORIDE 0.9 % IV SOLN
Freq: Once | INTRAVENOUS | Status: AC
  Administered 2018-10-11: 09:00:00 via INTRAVENOUS
  Filled 2018-10-11: qty 250

## 2018-10-11 MED ORDER — TOPOTECAN HCL CHEMO INJECTION 4 MG
1.3000 mg/m2 | Freq: Once | INTRAVENOUS | Status: AC
  Administered 2018-10-11: 10:00:00 2.7 mg via INTRAVENOUS
  Filled 2018-10-11: qty 2.7

## 2018-10-11 MED ORDER — HEPARIN SOD (PORK) LOCK FLUSH 100 UNIT/ML IV SOLN
500.0000 [IU] | Freq: Once | INTRAVENOUS | Status: AC | PRN
  Administered 2018-10-11: 500 [IU]
  Filled 2018-10-11: qty 5

## 2018-10-12 ENCOUNTER — Other Ambulatory Visit: Payer: Self-pay

## 2018-10-12 ENCOUNTER — Inpatient Hospital Stay: Payer: Medicare Other

## 2018-10-12 VITALS — BP 108/71 | HR 69 | Temp 96.5°F | Resp 18

## 2018-10-12 DIAGNOSIS — D649 Anemia, unspecified: Secondary | ICD-10-CM | POA: Diagnosis not present

## 2018-10-12 DIAGNOSIS — C7951 Secondary malignant neoplasm of bone: Secondary | ICD-10-CM | POA: Diagnosis not present

## 2018-10-12 DIAGNOSIS — Z5111 Encounter for antineoplastic chemotherapy: Secondary | ICD-10-CM | POA: Diagnosis not present

## 2018-10-12 DIAGNOSIS — C3490 Malignant neoplasm of unspecified part of unspecified bronchus or lung: Secondary | ICD-10-CM

## 2018-10-12 DIAGNOSIS — C3431 Malignant neoplasm of lower lobe, right bronchus or lung: Secondary | ICD-10-CM | POA: Diagnosis not present

## 2018-10-12 DIAGNOSIS — C787 Secondary malignant neoplasm of liver and intrahepatic bile duct: Secondary | ICD-10-CM | POA: Diagnosis not present

## 2018-10-12 DIAGNOSIS — E785 Hyperlipidemia, unspecified: Secondary | ICD-10-CM | POA: Diagnosis not present

## 2018-10-12 MED ORDER — SODIUM CHLORIDE 0.9% FLUSH
10.0000 mL | INTRAVENOUS | Status: DC | PRN
  Administered 2018-10-12: 09:00:00 10 mL
  Filled 2018-10-12: qty 10

## 2018-10-12 MED ORDER — PROCHLORPERAZINE MALEATE 10 MG PO TABS
10.0000 mg | ORAL_TABLET | Freq: Once | ORAL | Status: AC
  Administered 2018-10-12: 10 mg via ORAL
  Filled 2018-10-12: qty 1

## 2018-10-12 MED ORDER — TOPOTECAN HCL CHEMO INJECTION 4 MG
1.3000 mg/m2 | Freq: Once | INTRAVENOUS | Status: AC
  Administered 2018-10-12: 2.7 mg via INTRAVENOUS
  Filled 2018-10-12: qty 2.7

## 2018-10-12 MED ORDER — SODIUM CHLORIDE 0.9 % IV SOLN
Freq: Once | INTRAVENOUS | Status: AC
  Administered 2018-10-12: 09:00:00 via INTRAVENOUS
  Filled 2018-10-12: qty 250

## 2018-10-12 MED ORDER — HEPARIN SOD (PORK) LOCK FLUSH 100 UNIT/ML IV SOLN
500.0000 [IU] | Freq: Once | INTRAVENOUS | Status: AC | PRN
  Administered 2018-10-12: 500 [IU]
  Filled 2018-10-12: qty 5

## 2018-10-16 ENCOUNTER — Telehealth: Payer: Self-pay | Admitting: *Deleted

## 2018-10-16 NOTE — Telephone Encounter (Signed)
Patient called stating her MS needs prior authorization today as she will be out of her medicine today

## 2018-10-16 NOTE — Telephone Encounter (Signed)
I have initiated PA

## 2018-10-16 NOTE — Telephone Encounter (Signed)
P/A approved and sent notification to pharmacy.  Thanks

## 2018-11-05 ENCOUNTER — Inpatient Hospital Stay: Payer: Medicare Other | Attending: Oncology

## 2018-11-05 ENCOUNTER — Inpatient Hospital Stay: Payer: Medicare Other

## 2018-11-05 ENCOUNTER — Other Ambulatory Visit: Payer: Self-pay

## 2018-11-05 ENCOUNTER — Inpatient Hospital Stay (HOSPITAL_BASED_OUTPATIENT_CLINIC_OR_DEPARTMENT_OTHER): Payer: Medicare Other | Admitting: Oncology

## 2018-11-05 VITALS — BP 107/61 | HR 63 | Temp 97.0°F | Resp 18

## 2018-11-05 DIAGNOSIS — D649 Anemia, unspecified: Secondary | ICD-10-CM | POA: Diagnosis not present

## 2018-11-05 DIAGNOSIS — R531 Weakness: Secondary | ICD-10-CM | POA: Diagnosis not present

## 2018-11-05 DIAGNOSIS — C3490 Malignant neoplasm of unspecified part of unspecified bronchus or lung: Secondary | ICD-10-CM

## 2018-11-05 DIAGNOSIS — I509 Heart failure, unspecified: Secondary | ICD-10-CM | POA: Insufficient documentation

## 2018-11-05 DIAGNOSIS — Z5111 Encounter for antineoplastic chemotherapy: Secondary | ICD-10-CM | POA: Insufficient documentation

## 2018-11-05 DIAGNOSIS — I10 Essential (primary) hypertension: Secondary | ICD-10-CM | POA: Diagnosis not present

## 2018-11-05 DIAGNOSIS — E876 Hypokalemia: Secondary | ICD-10-CM | POA: Insufficient documentation

## 2018-11-05 DIAGNOSIS — Z87891 Personal history of nicotine dependence: Secondary | ICD-10-CM | POA: Insufficient documentation

## 2018-11-05 DIAGNOSIS — C7951 Secondary malignant neoplasm of bone: Secondary | ICD-10-CM | POA: Insufficient documentation

## 2018-11-05 DIAGNOSIS — C787 Secondary malignant neoplasm of liver and intrahepatic bile duct: Secondary | ICD-10-CM | POA: Diagnosis not present

## 2018-11-05 DIAGNOSIS — Z79899 Other long term (current) drug therapy: Secondary | ICD-10-CM | POA: Diagnosis not present

## 2018-11-05 DIAGNOSIS — F419 Anxiety disorder, unspecified: Secondary | ICD-10-CM | POA: Diagnosis not present

## 2018-11-05 DIAGNOSIS — J449 Chronic obstructive pulmonary disease, unspecified: Secondary | ICD-10-CM | POA: Insufficient documentation

## 2018-11-05 DIAGNOSIS — M129 Arthropathy, unspecified: Secondary | ICD-10-CM | POA: Diagnosis not present

## 2018-11-05 DIAGNOSIS — K219 Gastro-esophageal reflux disease without esophagitis: Secondary | ICD-10-CM | POA: Insufficient documentation

## 2018-11-05 DIAGNOSIS — E785 Hyperlipidemia, unspecified: Secondary | ICD-10-CM | POA: Insufficient documentation

## 2018-11-05 LAB — CBC WITH DIFFERENTIAL/PLATELET
Abs Immature Granulocytes: 0.02 10*3/uL (ref 0.00–0.07)
Basophils Absolute: 0 10*3/uL (ref 0.0–0.1)
Basophils Relative: 0 %
Eosinophils Absolute: 0 10*3/uL (ref 0.0–0.5)
Eosinophils Relative: 0 %
HCT: 32.8 % — ABNORMAL LOW (ref 36.0–46.0)
Hemoglobin: 9.7 g/dL — ABNORMAL LOW (ref 12.0–15.0)
Immature Granulocytes: 0 %
Lymphocytes Relative: 23 %
Lymphs Abs: 1.6 10*3/uL (ref 0.7–4.0)
MCH: 27.9 pg (ref 26.0–34.0)
MCHC: 29.6 g/dL — ABNORMAL LOW (ref 30.0–36.0)
MCV: 94.3 fL (ref 80.0–100.0)
Monocytes Absolute: 0.8 10*3/uL (ref 0.1–1.0)
Monocytes Relative: 11 %
Neutro Abs: 4.6 10*3/uL (ref 1.7–7.7)
Neutrophils Relative %: 66 %
Platelets: 297 10*3/uL (ref 150–400)
RBC: 3.48 MIL/uL — ABNORMAL LOW (ref 3.87–5.11)
RDW: 20.1 % — ABNORMAL HIGH (ref 11.5–15.5)
WBC: 7.1 10*3/uL (ref 4.0–10.5)
nRBC: 0 % (ref 0.0–0.2)

## 2018-11-05 LAB — COMPREHENSIVE METABOLIC PANEL
ALT: 30 U/L (ref 0–44)
AST: 34 U/L (ref 15–41)
Albumin: 3.4 g/dL — ABNORMAL LOW (ref 3.5–5.0)
Alkaline Phosphatase: 74 U/L (ref 38–126)
Anion gap: 7 (ref 5–15)
BUN: 10 mg/dL (ref 8–23)
CO2: 25 mmol/L (ref 22–32)
Calcium: 8.4 mg/dL — ABNORMAL LOW (ref 8.9–10.3)
Chloride: 106 mmol/L (ref 98–111)
Creatinine, Ser: 0.71 mg/dL (ref 0.44–1.00)
GFR calc Af Amer: >60 mL/min (ref >60)
GFR calc non Af Amer: >60 mL/min (ref >60)
Glucose, Bld: 115 mg/dL — ABNORMAL HIGH (ref 70–99)
Potassium: 4 mmol/L (ref 3.5–5.1)
Sodium: 138 mmol/L (ref 135–145)
Total Bilirubin: 0.4 mg/dL (ref 0.3–1.2)
Total Protein: 8.1 g/dL (ref 6.5–8.1)

## 2018-11-05 LAB — SAMPLE TO BLOOD BANK

## 2018-11-05 MED ORDER — SODIUM CHLORIDE 0.9% FLUSH
10.0000 mL | Freq: Once | INTRAVENOUS | Status: AC
  Administered 2018-11-05: 10:00:00 10 mL via INTRAVENOUS
  Filled 2018-11-05: qty 10

## 2018-11-05 MED ORDER — HEPARIN SOD (PORK) LOCK FLUSH 100 UNIT/ML IV SOLN
500.0000 [IU] | Freq: Once | INTRAVENOUS | Status: AC
  Administered 2018-11-05: 500 [IU] via INTRAVENOUS
  Filled 2018-11-05: qty 5

## 2018-11-05 MED ORDER — SODIUM CHLORIDE 0.9 % IV SOLN
Freq: Once | INTRAVENOUS | Status: AC
  Administered 2018-11-05: 11:00:00 via INTRAVENOUS
  Filled 2018-11-05: qty 250

## 2018-11-05 MED ORDER — PROCHLORPERAZINE MALEATE 10 MG PO TABS
10.0000 mg | ORAL_TABLET | Freq: Once | ORAL | Status: AC
  Administered 2018-11-05: 10 mg via ORAL
  Filled 2018-11-05: qty 1

## 2018-11-05 MED ORDER — TOPOTECAN HCL CHEMO INJECTION 4 MG
1.3000 mg/m2 | Freq: Once | INTRAVENOUS | Status: AC
  Administered 2018-11-05: 11:00:00 2.7 mg via INTRAVENOUS
  Filled 2018-11-05: qty 2.7

## 2018-11-05 NOTE — Progress Notes (Signed)
Proceed with treatment today per Dr. Grayland Ormond

## 2018-11-05 NOTE — Progress Notes (Signed)
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  INTERVAL HISTORY: Patient returns to clinic today for further evaluation and consideration of cycle 12 topotecan.  Recently completed imaging revealing no new or progressive interval findings.  She continues to have chronic weakness and fatigue but overall is doing well.  Has chronic pain that is controlled with MS Contin.  She denies any recent fevers or illnesses.  She has a good appetite and denies weight loss.  She has chronic shortness of breath, but denies any chest pain, cough, or hemoptysis. She has no abdominal pain.  She denies any nausea, vomiting, constipation, or diarrhea.  She denies any melena or hematochezia.  She has no urinary complaints.  Patient offers no further specific complaints today.  REVIEW OF SYSTEMS:   Review of Systems  Constitutional: Positive for malaise/fatigue.  Respiratory: Positive for cough and shortness of breath.   Musculoskeletal: Positive for myalgias.  Neurological: Positive for weakness.  Psychiatric/Behavioral: The patient is nervous/anxious.     As per HPI. Otherwise, a complete review of systems is negative.  PAST MEDICAL HISTORY: Past Medical History:  Diagnosis Date  . Anxiety   . Arthritis   . Asthma    COPD  . CAP (community acquired pneumonia) 06/30/2014  . Cardiomyopathy ([place])    nonischemic (EF 35-40%)  . CHF (congestive heart failure) ([place])   . COPD (chronic obstructive pulmonary disease) ([place])   . Depression   . Dyspnea   . GERD (gastroesophageal reflux disease)   . Hyperlipidemia   . Hypertension   . Pneumonia   . PVC's (premature ventricular contractions)    states 10 years ago   . Sepsis ([place]) 06/30/2014  . Small cell lung cancer ([place]) 12/2016   Chemo tx's.  . SOB (shortness of breath) 05/08/2014  . Spinal cord injury at T7-T12 level [facility])    2016    PAST SURGICAL HISTORY: Past Surgical History:  Procedure Laterality Date  . APPENDECTOMY    . CARDIAC CATHETERIZATION    . CATARACT EXTRACTION     right eye   . CESAREAN SECTION    . COLONOSCOPY    . LAMINOTOMY    . LUMBAR LAMINECTOMY/DECOMPRESSION MICRODISCECTOMY Left 08/01/2016   Procedure: Left Lumbar Four-Five Laminectomy and Foraminotomy;  Surgeon: Earnie Larsson, MD;  Location: [place];  Service: Neurosurgery;  Laterality: Left;  . PORTA CATH INSERTION N/A 02/03/2017   Procedure: PORTA CATH INSERTION;  Surgeon: Katha Cabal, MD;  Location: [place] CV LAB;  Service: Cardiovascular;  Laterality: N/A;    FAMILY HISTORY: Family History  Problem Relation Age of Onset  . Heart attack Father 66       MI x 3     ADVANCED DIRECTIVES (Y/N):  N  HEALTH MAINTENANCE: Social History   Tobacco Use  . Smoking status: Former Smoker    Packs/day: 0.50    Years: 50.00    Pack years: 25.00    Types: Cigarettes    Quit date: 01/27/2015    Years since quitting: 3.7  . Smokeless tobacco: Never Used  Substance Use Topics  . Alcohol use: No  . Drug use: No     Colonoscopy:  PAP:  Bone density:  Lipid panel:  Allergies  Allergen Reactions  . Fish Allergy Anaphylaxis    Per pt after eating [facility] she had throat swelling and SOB. MSY  Per pt after eating [facility] she had throat swelling and SOB. MSY   . Fentanyl     Overly sedated and groggy, ineffective     Current Outpatient Medications  Medication Sig Dispense Refill  . ADVAIR DISKUS 250-50 MCG/DOSE AEPB Inhale 1 puff into the lungs 2 (two) times daily.    Marland Kitchen albuterol (PROAIR HFA) 108 (90 Base) MCG/ACT inhaler Inhale 2 puffs into the lungs every 6 (six) hours as needed for wheezing or shortness of breath.     Marland Kitchen alendronate (FOSAMAX) 70  MG tablet Take 70 mg by mouth every Thursday.     . ARIPiprazole (ABILIFY) 5 MG tablet Take 5 mg by mouth daily.    Marland Kitchen BELBUCA 150 MCG FILM Take 1 tablet by mouth daily.  0  . benzonatate (TESSALON) 100 MG capsule Take 1 capsule (100 mg total) by mouth every 8 (eight) hours as needed for cough. 20 capsule 0  . dextromethorphan-guaiFENesin (MUCINEX DM) 30-600 MG 12hr tablet Take 1 tablet by mouth 2 (two) times daily as needed for cough.    . diphenoxylate-atropine (LOMOTIL) 2.5-0.025 MG tablet Take 1 tablet by mouth 4 (four) times daily as needed for diarrhea or loose stools. 30 tablet 0  . DULoxetine (CYMBALTA) 60 MG capsule Take 60 mg by mouth daily.    Marland Kitchen ipratropium-albuterol (DUONEB) 0.5-2.5 (3) MG/3ML SOLN Take 3 mLs by nebulization every 4 (four) hours as needed. 360 mL 1  . LORazepam (ATIVAN) 0.5 MG tablet Take 0.5 mg by mouth as needed.    . metoprolol succinate (TOPROL-XL) 100 MG 24 hr tablet Take 1 tablet by mouth 1 day or 1 dose.    . morphine (MS CONTIN) 30 MG 12 hr tablet Take 1 tablet (30 mg total) by mouth every 8 (eight) hours. 90 tablet 0  . pantoprazole (PROTONIX) 40 MG tablet Take 1 tablet by mouth daily.    . potassium chloride SA (K-DUR,KLOR-CON) 20 MEQ tablet Take 1 tablet (20 mEq total) by mouth daily. 30 tablet 2  . prochlorperazine (COMPAZINE) 10 MG tablet Take 1 tablet (10 mg total) by mouth every 6 (six) hours as needed (Nausea or vomiting). 60 tablet 2  . promethazine (PHENERGAN) 25 MG tablet Take 1 tablet (25 mg total) by mouth every 8 (eight) hours as needed for nausea or vomiting. 30 tablet 2  . sacubitril-valsartan (ENTRESTO) 24-26 MG Take 1 tablet by mouth 2 (two) times daily. 180 tablet 3  . tiZANidine (ZANAFLEX) 4 MG tablet Take 1 tablet (4 mg total) by mouth every 8 (eight) hours as needed for muscle spasms. 90 tablet 2   No current facility-administered medications for this visit.    Facility-Administered Medications Ordered in Other Visits  Medication Dose  Route Frequency Provider Last Rate Last Dose  . sodium chloride flush (NS) 0.9 % injection 10 mL  10 mL Intravenous PRN Lloyd Huger, MD   10 mL at 03/01/17 0841    OBJECTIVE: Vitals:   11/05/18 0949  BP: 107/61  Pulse: 63  Resp: 18  Temp: (!) 97 F (36.1 C)  SpO2: 94%     There is no height or weight on file to calculate BMI.    ECOG FS:1 - Symptomatic but completely ambulatory  Physical Exam Vitals signs (Sitting in wheelchair; alone) reviewed.  Cardiovascular:     Rate and  Rhythm: Normal rate and regular rhythm.     Heart sounds: Normal heart sounds.  Pulmonary:     Effort: Pulmonary effort is normal.     Breath sounds: Normal breath sounds.  Skin:    General: Skin is warm.  Neurological:     Mental Status: She is alert and oriented to person, place, and time. Mental status is at baseline.     LAB RESULTS:  Lab Results  Component Value Date   NA 138 11/05/2018   K 4.0 11/05/2018   CL 106 11/05/2018   CO2 25 11/05/2018   GLUCOSE 115 (H) 11/05/2018   BUN 10 11/05/2018   CREATININE 0.71 11/05/2018   CALCIUM 8.4 (L) 11/05/2018   PROT 8.1 11/05/2018   ALBUMIN 3.4 (L) 11/05/2018   AST 34 11/05/2018   ALT 30 11/05/2018   ALKPHOS 74 11/05/2018   BILITOT 0.4 11/05/2018   GFRNONAA >60 11/05/2018   GFRAA >60 11/05/2018    Lab Results  Component Value Date   WBC 7.1 11/05/2018   NEUTROABS 4.6 11/05/2018   HGB 9.7 (L) 11/05/2018   HCT 32.8 (L) 11/05/2018   MCV 94.3 11/05/2018   PLT 297 11/05/2018     STUDIES: No results found.  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  PLAN:  1.  Proceed with cycle 12, day 1 of dose reduced topotecan today.  Return to clinic Tuesday through Friday for topotecan only.  Patient return to clinic in 4 weeks for further evaluation and consideration of additional cycles. 2. Pain: Continue MS Contin to 30 mg every 8 hours.  3. Thrombocytopenia: Resolved.  Proceed with treatment every 28  days as above. 4.  Anemia: Hemoglobin stable 9.7 (slightly improved) monitor. 5.  Neutropenia: Resolved.   6.  COPD: Chronic and unchanged.  Continue outpatient palliative care.  Continue current medications as prescribed. 7.  Hypokalemia: stable.  Patient expressed understanding and was in agreement with this plan. She also understands that She can call clinic at any time with any questions, concerns, or complaints.   Greater than 50% was spent in counseling and coordination of care with this patient including but not limited to discussion of the relevant topics above (See A&P) including, but not limited to diagnosis and management of acute and chronic medical conditions.   Cancer Staging Small cell lung cancer [facility]) Staging form: Lung, AJCC 8th Edition - Clinical stage from 01/28/2017: Stage IV (cT4, cN3, pM1c) - Signed by Lloyd Huger, MD on 01/28/2017   Grace Hawking, NP   11/06/2018 2:36 PM

## 2018-11-06 ENCOUNTER — Other Ambulatory Visit: Payer: Self-pay

## 2018-11-06 ENCOUNTER — Inpatient Hospital Stay: Payer: Medicare Other

## 2018-11-06 VITALS — BP 96/63 | HR 96 | Temp 97.7°F | Resp 18

## 2018-11-06 DIAGNOSIS — C787 Secondary malignant neoplasm of liver and intrahepatic bile duct: Secondary | ICD-10-CM | POA: Diagnosis not present

## 2018-11-06 DIAGNOSIS — J449 Chronic obstructive pulmonary disease, unspecified: Secondary | ICD-10-CM | POA: Diagnosis not present

## 2018-11-06 DIAGNOSIS — C3490 Malignant neoplasm of unspecified part of unspecified bronchus or lung: Secondary | ICD-10-CM

## 2018-11-06 DIAGNOSIS — Z5111 Encounter for antineoplastic chemotherapy: Secondary | ICD-10-CM | POA: Diagnosis not present

## 2018-11-06 DIAGNOSIS — D649 Anemia, unspecified: Secondary | ICD-10-CM | POA: Diagnosis not present

## 2018-11-06 DIAGNOSIS — C7951 Secondary malignant neoplasm of bone: Secondary | ICD-10-CM | POA: Diagnosis not present

## 2018-11-06 MED ORDER — PROCHLORPERAZINE MALEATE 10 MG PO TABS
10.0000 mg | ORAL_TABLET | Freq: Once | ORAL | Status: AC
  Administered 2018-11-06: 10 mg via ORAL
  Filled 2018-11-06: qty 1

## 2018-11-06 MED ORDER — SODIUM CHLORIDE 0.9 % IV SOLN
Freq: Once | INTRAVENOUS | Status: AC
  Administered 2018-11-06: 10:00:00 via INTRAVENOUS
  Filled 2018-11-06: qty 250

## 2018-11-06 MED ORDER — HEPARIN SOD (PORK) LOCK FLUSH 100 UNIT/ML IV SOLN
INTRAVENOUS | Status: AC
  Filled 2018-11-06: qty 5

## 2018-11-06 MED ORDER — TOPOTECAN HCL CHEMO INJECTION 4 MG
1.3000 mg/m2 | Freq: Once | INTRAVENOUS | Status: AC
  Administered 2018-11-06: 2.7 mg via INTRAVENOUS
  Filled 2018-11-06: qty 2.7

## 2018-11-06 MED ORDER — HEPARIN SOD (PORK) LOCK FLUSH 100 UNIT/ML IV SOLN
500.0000 [IU] | Freq: Once | INTRAVENOUS | Status: AC
  Administered 2018-11-06: 500 [IU] via INTRAVENOUS

## 2018-11-07 ENCOUNTER — Inpatient Hospital Stay: Payer: Medicare Other

## 2018-11-07 ENCOUNTER — Other Ambulatory Visit: Payer: Self-pay

## 2018-11-07 VITALS — BP 109/72 | HR 81 | Temp 96.8°F | Resp 18

## 2018-11-07 DIAGNOSIS — Z5111 Encounter for antineoplastic chemotherapy: Secondary | ICD-10-CM | POA: Diagnosis not present

## 2018-11-07 DIAGNOSIS — C7951 Secondary malignant neoplasm of bone: Secondary | ICD-10-CM | POA: Diagnosis not present

## 2018-11-07 DIAGNOSIS — C3490 Malignant neoplasm of unspecified part of unspecified bronchus or lung: Secondary | ICD-10-CM

## 2018-11-07 DIAGNOSIS — D649 Anemia, unspecified: Secondary | ICD-10-CM | POA: Diagnosis not present

## 2018-11-07 DIAGNOSIS — C787 Secondary malignant neoplasm of liver and intrahepatic bile duct: Secondary | ICD-10-CM | POA: Diagnosis not present

## 2018-11-07 DIAGNOSIS — J449 Chronic obstructive pulmonary disease, unspecified: Secondary | ICD-10-CM | POA: Diagnosis not present

## 2018-11-07 MED ORDER — TOPOTECAN HCL CHEMO INJECTION 4 MG
1.3000 mg/m2 | Freq: Once | INTRAVENOUS | Status: AC
  Administered 2018-11-07: 09:00:00 2.7 mg via INTRAVENOUS
  Filled 2018-11-07: qty 2.7

## 2018-11-07 MED ORDER — PROCHLORPERAZINE MALEATE 10 MG PO TABS
10.0000 mg | ORAL_TABLET | Freq: Once | ORAL | Status: AC
  Administered 2018-11-07: 10 mg via ORAL
  Filled 2018-11-07: qty 1

## 2018-11-07 MED ORDER — SODIUM CHLORIDE 0.9 % IV SOLN
Freq: Once | INTRAVENOUS | Status: AC
  Administered 2018-11-07: 09:00:00 via INTRAVENOUS
  Filled 2018-11-07: qty 250

## 2018-11-07 MED ORDER — SODIUM CHLORIDE 0.9% FLUSH
10.0000 mL | INTRAVENOUS | Status: DC | PRN
  Filled 2018-11-07: qty 10

## 2018-11-07 MED ORDER — HEPARIN SOD (PORK) LOCK FLUSH 100 UNIT/ML IV SOLN
500.0000 [IU] | Freq: Once | INTRAVENOUS | Status: AC | PRN
  Administered 2018-11-07: 10:00:00 500 [IU]
  Filled 2018-11-07: qty 5

## 2018-11-08 ENCOUNTER — Other Ambulatory Visit: Payer: Self-pay

## 2018-11-08 ENCOUNTER — Inpatient Hospital Stay: Payer: Medicare Other

## 2018-11-08 VITALS — BP 124/83 | HR 94 | Temp 98.0°F | Resp 20

## 2018-11-08 DIAGNOSIS — C787 Secondary malignant neoplasm of liver and intrahepatic bile duct: Secondary | ICD-10-CM | POA: Diagnosis not present

## 2018-11-08 DIAGNOSIS — Z5111 Encounter for antineoplastic chemotherapy: Secondary | ICD-10-CM | POA: Diagnosis not present

## 2018-11-08 DIAGNOSIS — D649 Anemia, unspecified: Secondary | ICD-10-CM | POA: Diagnosis not present

## 2018-11-08 DIAGNOSIS — C7951 Secondary malignant neoplasm of bone: Secondary | ICD-10-CM | POA: Diagnosis not present

## 2018-11-08 DIAGNOSIS — C3490 Malignant neoplasm of unspecified part of unspecified bronchus or lung: Secondary | ICD-10-CM | POA: Diagnosis not present

## 2018-11-08 DIAGNOSIS — J449 Chronic obstructive pulmonary disease, unspecified: Secondary | ICD-10-CM | POA: Diagnosis not present

## 2018-11-08 MED ORDER — TOPOTECAN HCL CHEMO INJECTION 4 MG
1.3000 mg/m2 | Freq: Once | INTRAVENOUS | Status: AC
  Administered 2018-11-08: 2.7 mg via INTRAVENOUS
  Filled 2018-11-08: qty 2.7

## 2018-11-08 MED ORDER — HEPARIN SOD (PORK) LOCK FLUSH 100 UNIT/ML IV SOLN
500.0000 [IU] | Freq: Once | INTRAVENOUS | Status: AC | PRN
  Administered 2018-11-08: 500 [IU]
  Filled 2018-11-08: qty 5

## 2018-11-08 MED ORDER — PROCHLORPERAZINE MALEATE 10 MG PO TABS
10.0000 mg | ORAL_TABLET | Freq: Once | ORAL | Status: AC
  Administered 2018-11-08: 10 mg via ORAL
  Filled 2018-11-08: qty 1

## 2018-11-08 MED ORDER — SODIUM CHLORIDE 0.9 % IV SOLN
Freq: Once | INTRAVENOUS | Status: AC
  Administered 2018-11-08: 09:00:00 via INTRAVENOUS
  Filled 2018-11-08: qty 250

## 2018-11-09 ENCOUNTER — Other Ambulatory Visit: Payer: Self-pay

## 2018-11-09 ENCOUNTER — Inpatient Hospital Stay: Payer: Medicare Other

## 2018-11-09 VITALS — BP 107/71 | HR 78 | Temp 97.0°F | Resp 19

## 2018-11-09 DIAGNOSIS — D649 Anemia, unspecified: Secondary | ICD-10-CM | POA: Diagnosis not present

## 2018-11-09 DIAGNOSIS — J449 Chronic obstructive pulmonary disease, unspecified: Secondary | ICD-10-CM | POA: Diagnosis not present

## 2018-11-09 DIAGNOSIS — Z5111 Encounter for antineoplastic chemotherapy: Secondary | ICD-10-CM | POA: Diagnosis not present

## 2018-11-09 DIAGNOSIS — C7951 Secondary malignant neoplasm of bone: Secondary | ICD-10-CM | POA: Diagnosis not present

## 2018-11-09 DIAGNOSIS — C787 Secondary malignant neoplasm of liver and intrahepatic bile duct: Secondary | ICD-10-CM | POA: Diagnosis not present

## 2018-11-09 DIAGNOSIS — C3490 Malignant neoplasm of unspecified part of unspecified bronchus or lung: Secondary | ICD-10-CM

## 2018-11-09 MED ORDER — SODIUM CHLORIDE 0.9 % IV SOLN
Freq: Once | INTRAVENOUS | Status: AC
  Administered 2018-11-09: 09:00:00 via INTRAVENOUS
  Filled 2018-11-09: qty 250

## 2018-11-09 MED ORDER — HEPARIN SOD (PORK) LOCK FLUSH 100 UNIT/ML IV SOLN
500.0000 [IU] | Freq: Once | INTRAVENOUS | Status: AC | PRN
  Administered 2018-11-09: 500 [IU]
  Filled 2018-11-09: qty 5

## 2018-11-09 MED ORDER — PROCHLORPERAZINE MALEATE 10 MG PO TABS
10.0000 mg | ORAL_TABLET | Freq: Once | ORAL | Status: AC
  Administered 2018-11-09: 09:00:00 10 mg via ORAL
  Filled 2018-11-09: qty 1

## 2018-11-09 MED ORDER — TOPOTECAN HCL CHEMO INJECTION 4 MG
1.3000 mg/m2 | Freq: Once | INTRAVENOUS | Status: AC
  Administered 2018-11-09: 2.7 mg via INTRAVENOUS
  Filled 2018-11-09: qty 2.7

## 2018-11-12 ENCOUNTER — Telehealth: Payer: Self-pay | Admitting: Adult Health Nurse Practitioner

## 2018-11-12 NOTE — Telephone Encounter (Signed)
Called patient to set up follow up visit.  Stated she was doing well and did not need a visit right now.  Will call back in a month or two to see how she is doing.   Keante Urizar K. Olena Heckle NP

## 2018-11-15 ENCOUNTER — Other Ambulatory Visit: Payer: Self-pay | Admitting: *Deleted

## 2018-11-16 MED ORDER — MORPHINE SULFATE ER 30 MG PO TBCR: 30.0000 mg | EXTENDED_RELEASE_TABLET | Freq: Three times a day (TID) | ORAL | 0 refills | Status: DC

## 2018-11-16 NOTE — Telephone Encounter (Signed)
Patient called [facility] requesting refill of MS Contin.   As mandated by the [place] STOP Act (Strengthen Opioid Misuse Prevention), the [place] Controlled Substance Reporting System ([place]) was reviewed for this patient.  Per medical oncology, Dr. Grayland Ormond, continuation of opiate therapy is medically appropriate at this time and he will provide continual monitoring, including urine/blood drug screens, as indicated. Prescription sent electronically using [employer] secure transmission to requested pharmacy.   [place] Reviewed & PDMP UPdated as appropriate.   Beckey Rutter, DNP, AGNP-C [place] at [facility] [phone] (work cell) [phone] (office)

## 2018-11-23 ENCOUNTER — Other Ambulatory Visit: Payer: Self-pay

## 2018-11-23 ENCOUNTER — Encounter: Payer: Self-pay | Admitting: Oncology

## 2018-11-29 NOTE — Progress Notes (Signed)
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  INTERVAL HISTORY: Patient returns to clinic today for further evaluate and consideration of cycle 13 of monthly topotecan.  She continues to tolerate her treatments relatively well.  She has chronic weakness and fatigue.  She has chronic pain, but this is well controlled on her current dose of narcotics. She has no neurologic complaints.  She denies any recent fevers or illnesses.  She has a good appetite and denies weight loss.  She has chronic shortness of breath, but denies any chest pain, cough, or hemoptysis. She has no abdominal pain.  She denies any nausea, vomiting, constipation, or diarrhea.  She denies any melena or hematochezia.  She has no urinary complaints.  Patient offers no further specific complaints today.  REVIEW OF SYSTEMS:   Review of Systems  Constitutional: Positive for malaise/fatigue. Negative for fever and weight loss.  Respiratory: Positive for shortness of breath. Negative for cough, hemoptysis and wheezing.   Cardiovascular: Negative.  Negative for chest pain and leg swelling.  Gastrointestinal: Negative for abdominal pain, blood in stool, diarrhea, melena, nausea and vomiting.  Genitourinary: Negative.  Negative for dysuria and frequency.  Musculoskeletal: Positive for back pain, joint pain and neck pain.  Skin: Negative.  Negative for rash.  Neurological: Positive for weakness. Negative for sensory change, focal weakness and headaches.  Psychiatric/Behavioral: Negative.  Negative for memory loss. The patient is not nervous/anxious.     As per HPI. Otherwise, a complete review of systems is negative.  PAST MEDICAL  HISTORY: Past Medical History:  Diagnosis Date  . Anxiety   . Arthritis   . Asthma    COPD  . CAP (community acquired pneumonia) 06/30/2014  . Cardiomyopathy ([place])    nonischemic (EF 35-40%)  . CHF (congestive heart failure) ([place])   . COPD (chronic obstructive pulmonary disease) ([place])   . Depression   . Dyspnea   . GERD (gastroesophageal reflux disease)   . Hyperlipidemia   . Hypertension   . Pneumonia   . PVC's (premature ventricular contractions)    states 10 years ago  . Sepsis ([place]) 06/30/2014  . Small cell lung cancer ([place]) 12/2016   Chemo tx's.  . SOB (shortness of breath) 05/08/2014  . Spinal cord injury at T7-T12 level [facility])    2016    PAST SURGICAL HISTORY: Past Surgical History:  Procedure Laterality Date  . APPENDECTOMY    . CARDIAC CATHETERIZATION    . CATARACT EXTRACTION     right eye   . CESAREAN SECTION    . COLONOSCOPY    . LAMINOTOMY    . LUMBAR LAMINECTOMY/DECOMPRESSION MICRODISCECTOMY Left 08/01/2016   Procedure: Left Lumbar Four-Five Laminectomy and Foraminotomy;  Surgeon: Earnie Larsson, MD;  Location: [place];  Service: Neurosurgery;  Laterality: Left;  . PORTA CATH INSERTION N/A 02/03/2017   Procedure: PORTA CATH INSERTION;  Surgeon: Katha Cabal, MD;  Location: [place] CV LAB;  Service: Cardiovascular;  Laterality: N/A;    FAMILY HISTORY: Family History  Problem Relation Age of Onset  . Heart attack Father 81       MI x 3     ADVANCED DIRECTIVES (Y/N):  N  HEALTH MAINTENANCE: Social History  Tobacco Use  . Smoking status: Former Smoker    Packs/day: 0.50    Years: 50.00    Pack years: 25.00    Types: Cigarettes    Quit date: 01/27/2015    Years since quitting: 3.8  . Smokeless tobacco: Never Used  Substance Use Topics  . Alcohol use: No  . Drug use: No     Colonoscopy:  PAP:  Bone density:  Lipid panel:  Allergies  Allergen Reactions  . Fish Allergy Anaphylaxis    Per pt after eating [facility] she had throat  swelling and SOB. MSY  Per pt after eating [facility] she had throat swelling and SOB. MSY   . Fentanyl     Overly sedated and groggy, ineffective     Current Outpatient Medications  Medication Sig Dispense Refill  . ADVAIR DISKUS 250-50 MCG/DOSE AEPB Inhale 1 puff into the lungs 2 (two) times daily.    Marland Kitchen albuterol (PROAIR HFA) 108 (90 Base) MCG/ACT inhaler Inhale 2 puffs into the lungs every 6 (six) hours as needed for wheezing or shortness of breath.     Marland Kitchen alendronate (FOSAMAX) 70 MG tablet Take 70 mg by mouth every Thursday.     . ARIPiprazole (ABILIFY) 5 MG tablet Take 5 mg by mouth daily.    Marland Kitchen BELBUCA 150 MCG FILM Take 1 tablet by mouth daily.  0  . benzonatate (TESSALON) 100 MG capsule Take 1 capsule (100 mg total) by mouth every 8 (eight) hours as needed for cough. 20 capsule 0  . diphenoxylate-atropine (LOMOTIL) 2.5-0.025 MG tablet Take 1 tablet by mouth 4 (four) times daily as needed for diarrhea or loose stools. 30 tablet 0  . DULoxetine (CYMBALTA) 60 MG capsule Take 60 mg by mouth daily.    Marland Kitchen ipratropium-albuterol (DUONEB) 0.5-2.5 (3) MG/3ML SOLN Take 3 mLs by nebulization every 4 (four) hours as needed. 360 mL 1  . LORazepam (ATIVAN) 0.5 MG tablet Take 0.5 mg by mouth as needed.    . metoprolol succinate (TOPROL-XL) 100 MG 24 hr tablet Take 1 tablet by mouth 1 day or 1 dose.    . morphine (MS CONTIN) 30 MG 12 hr tablet Take 1 tablet (30 mg total) by mouth every 8 (eight) hours. 90 tablet 0  . pantoprazole (PROTONIX) 40 MG tablet Take 1 tablet by mouth daily.    . potassium chloride SA (K-DUR,KLOR-CON) 20 MEQ tablet Take 1 tablet (20 mEq total) by mouth daily. 30 tablet 2  . prochlorperazine (COMPAZINE) 10 MG tablet Take 1 tablet (10 mg total) by mouth every 6 (six) hours as needed (Nausea or vomiting). 60 tablet 2  . promethazine (PHENERGAN) 25 MG tablet Take 1 tablet (25 mg total) by mouth every 8 (eight) hours as needed for nausea or vomiting. 30 tablet 2  . sacubitril-valsartan  (ENTRESTO) 24-26 MG Take 1 tablet by mouth 2 (two) times daily. 180 tablet 3  . dextromethorphan-guaiFENesin (MUCINEX DM) 30-600 MG 12hr tablet Take 1 tablet by mouth 2 (two) times daily as needed for cough.    Marland Kitchen tiZANidine (ZANAFLEX) 4 MG tablet Take 1 tablet (4 mg total) by mouth every 8 (eight) hours as needed for muscle spasms. 90 tablet 2   No current facility-administered medications for this visit.    Facility-Administered Medications Ordered in Other Visits  Medication Dose Route Frequency Provider Last Rate Last Dose  . heparin lock flush 100 unit/mL  500 Units Intracatheter Once PRN Lloyd Huger, MD      .  sodium chloride flush (NS) 0.9 % injection 10 mL  10 mL Intravenous PRN Lloyd Huger, MD   10 mL at 03/01/17 0841  . sodium chloride flush (NS) 0.9 % injection 10 mL  10 mL Intravenous Once Lloyd Huger, MD        OBJECTIVE: Vitals:   12/03/18 0932  BP: (!) 84/52  Pulse: 72  Temp: 97.7 F (36.5 C)     There is no height or weight on file to calculate BMI.    ECOG FS:1 - Symptomatic but completely ambulatory  General: Well-developed, well-nourished, no acute distress.  Sitting in wheelchair. Eyes: Pink conjunctiva, anicteric sclera. HEENT: Normocephalic, moist mucous membranes. Lungs: Clear to auscultation bilaterally. Heart: Regular rate and rhythm. No rubs, murmurs, or gallops. Abdomen: Soft, nontender, nondistended. No organomegaly noted, normoactive bowel sounds. Musculoskeletal: No edema, cyanosis, or clubbing. Neuro: Alert, answering all questions appropriately. Cranial nerves grossly intact. Skin: No rashes or petechiae noted. Psych: Normal affect.  LAB RESULTS:  Lab Results  Component Value Date   NA 137 12/03/2018   K 3.3 (L) 12/03/2018   CL 105 12/03/2018   CO2 25 12/03/2018   GLUCOSE 130 (H) 12/03/2018   BUN 12 12/03/2018   CREATININE 0.66 12/03/2018   CALCIUM 8.2 (L) 12/03/2018   PROT 7.7 12/03/2018   ALBUMIN 3.2 (L) 12/03/2018    AST 27 12/03/2018   ALT 31 12/03/2018   ALKPHOS 82 12/03/2018   BILITOT 0.3 12/03/2018   GFRNONAA >60 12/03/2018   GFRAA >60 12/03/2018    Lab Results  Component Value Date   WBC 8.9 12/03/2018   NEUTROABS 6.1 12/03/2018   HGB 9.7 (L) 12/03/2018   HCT 32.2 (L) 12/03/2018   MCV 91.5 12/03/2018   PLT 332 12/03/2018     STUDIES: No results found.  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  PLAN:  1. 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  Patient then received maintenance Tecentriq from Jun 21, 2017 through November 15, 2017.  CT scan results from December 25, 2017 reviewed independently with progressive disease in her mediastinum as well as significant progression of multifocal hepatic disease.  Hospice was briefly discussed, but patient wished to continue aggressive treatment.  Patient was initially scheduled to get topotecan on days 1 through 5 every 21 days, but given her persistent thrombocytopenia she can only receive treatment every 28 days.  Patient's most recent imaging on October 04, 2018 reviewed independently and reported as above with no new or progressive findings.  Proceed with cycle 13, day 1 of dose reduced topotecan today.  Return to clinic Tuesday through Friday for topotecan only.  Return to clinic in 4 weeks for further evaluation and consideration of cycle 14.  Will reimage 1 to 2 days prior to her next treatment.   2. Pain: Continue MS Contin to 30 mg every 8 hours. Patient states she has "fired" her pain doctor.   3. Thrombocytopenia: Resolved.  Proceed with treatment every 28 days as above. 4.  Anemia: Hemoglobin decreased, but stable at 9.7.  Monitor. 5.  Neutropenia: Resolved.   6.  COPD: Chronic and unchanged.  Continue outpatient palliative care.  Continue current  medications as prescribed. 7.  Hypokalemia: Mild.  Potassium is 3.3 today.  Continue oral potassium supplementation. 8.  Disposition: Hospice and end-of-life care  were previously discussed, but patient wishes to continue aggressive treatments.  She expressed understanding that her treatment options are limited.  She confirms that she is a DNR and is having outpatient palliative care visit.   Patient expressed understanding and was in agreement with this plan. She also understands that She can call clinic at any time with any questions, concerns, or complaints.   Cancer Staging Small cell lung cancer [facility]) Staging form: Lung, AJCC 8th Edition - Clinical stage from 01/28/2017: Stage IV (cT4, cN3, pM1c) - Signed by Lloyd Huger, MD on 01/28/2017   Lloyd Huger, MD   12/03/2018 1:34 PM

## 2018-11-30 ENCOUNTER — Other Ambulatory Visit: Payer: Self-pay

## 2018-11-30 DIAGNOSIS — D696 Thrombocytopenia, unspecified: Secondary | ICD-10-CM

## 2018-12-03 ENCOUNTER — Inpatient Hospital Stay: Payer: Medicare Other | Attending: Oncology

## 2018-12-03 ENCOUNTER — Inpatient Hospital Stay (HOSPITAL_BASED_OUTPATIENT_CLINIC_OR_DEPARTMENT_OTHER): Payer: Medicare Other | Admitting: Oncology

## 2018-12-03 ENCOUNTER — Other Ambulatory Visit: Payer: Self-pay

## 2018-12-03 ENCOUNTER — Encounter: Payer: Self-pay | Admitting: Oncology

## 2018-12-03 ENCOUNTER — Inpatient Hospital Stay: Payer: Medicare Other

## 2018-12-03 VITALS — BP 84/52 | HR 72 | Temp 97.7°F

## 2018-12-03 DIAGNOSIS — C787 Secondary malignant neoplasm of liver and intrahepatic bile duct: Secondary | ICD-10-CM | POA: Insufficient documentation

## 2018-12-03 DIAGNOSIS — Z5111 Encounter for antineoplastic chemotherapy: Secondary | ICD-10-CM | POA: Diagnosis not present

## 2018-12-03 DIAGNOSIS — C3490 Malignant neoplasm of unspecified part of unspecified bronchus or lung: Secondary | ICD-10-CM

## 2018-12-03 DIAGNOSIS — C7951 Secondary malignant neoplasm of bone: Secondary | ICD-10-CM | POA: Diagnosis not present

## 2018-12-03 DIAGNOSIS — D696 Thrombocytopenia, unspecified: Secondary | ICD-10-CM

## 2018-12-03 LAB — COMPREHENSIVE METABOLIC PANEL
ALT: 31 U/L (ref 0–44)
AST: 27 U/L (ref 15–41)
Albumin: 3.2 g/dL — ABNORMAL LOW (ref 3.5–5.0)
Alkaline Phosphatase: 82 U/L (ref 38–126)
Anion gap: 7 (ref 5–15)
BUN: 12 mg/dL (ref 8–23)
CO2: 25 mmol/L (ref 22–32)
Calcium: 8.2 mg/dL — ABNORMAL LOW (ref 8.9–10.3)
Chloride: 105 mmol/L (ref 98–111)
Creatinine, Ser: 0.66 mg/dL (ref 0.44–1.00)
GFR calc Af Amer: >60 mL/min (ref >60)
GFR calc non Af Amer: >60 mL/min (ref >60)
Glucose, Bld: 130 mg/dL — ABNORMAL HIGH (ref 70–99)
Potassium: 3.3 mmol/L — ABNORMAL LOW (ref 3.5–5.1)
Sodium: 137 mmol/L (ref 135–145)
Total Bilirubin: 0.3 mg/dL (ref 0.3–1.2)
Total Protein: 7.7 g/dL (ref 6.5–8.1)

## 2018-12-03 LAB — CBC WITH DIFFERENTIAL/PLATELET
Abs Immature Granulocytes: 0.05 10*3/uL (ref 0.00–0.07)
Basophils Absolute: 0 10*3/uL (ref 0.0–0.1)
Basophils Relative: 0 %
Eosinophils Absolute: 0 10*3/uL (ref 0.0–0.5)
Eosinophils Relative: 0 %
HCT: 32.2 % — ABNORMAL LOW (ref 36.0–46.0)
Hemoglobin: 9.7 g/dL — ABNORMAL LOW (ref 12.0–15.0)
Immature Granulocytes: 1 %
Lymphocytes Relative: 20 %
Lymphs Abs: 1.8 10*3/uL (ref 0.7–4.0)
MCH: 27.6 pg (ref 26.0–34.0)
MCHC: 30.1 g/dL (ref 30.0–36.0)
MCV: 91.5 fL (ref 80.0–100.0)
Monocytes Absolute: 0.9 10*3/uL (ref 0.1–1.0)
Monocytes Relative: 10 %
Neutro Abs: 6.1 10*3/uL (ref 1.7–7.7)
Neutrophils Relative %: 69 %
Platelets: 332 10*3/uL (ref 150–400)
RBC: 3.52 MIL/uL — ABNORMAL LOW (ref 3.87–5.11)
RDW: 20.6 % — ABNORMAL HIGH (ref 11.5–15.5)
WBC: 8.9 10*3/uL (ref 4.0–10.5)
nRBC: 0 % (ref 0.0–0.2)

## 2018-12-03 LAB — SAMPLE TO BLOOD BANK

## 2018-12-03 MED ORDER — PROCHLORPERAZINE MALEATE 10 MG PO TABS
10.0000 mg | ORAL_TABLET | Freq: Once | ORAL | Status: AC
  Administered 2018-12-03: 10 mg via ORAL
  Filled 2018-12-03: qty 1

## 2018-12-03 MED ORDER — HEPARIN SOD (PORK) LOCK FLUSH 100 UNIT/ML IV SOLN
500.0000 [IU] | Freq: Once | INTRAVENOUS | Status: AC
  Administered 2018-12-03: 500 [IU] via INTRAVENOUS

## 2018-12-03 MED ORDER — TOPOTECAN HCL CHEMO INJECTION 4 MG
1.3000 mg/m2 | Freq: Once | INTRAVENOUS | Status: AC
  Administered 2018-12-03: 2.7 mg via INTRAVENOUS
  Filled 2018-12-03: qty 2.7

## 2018-12-03 MED ORDER — HEPARIN SOD (PORK) LOCK FLUSH 100 UNIT/ML IV SOLN
500.0000 [IU] | Freq: Once | INTRAVENOUS | Status: DC | PRN
  Filled 2018-12-03: qty 5

## 2018-12-03 MED ORDER — SODIUM CHLORIDE 0.9 % IV SOLN
Freq: Once | INTRAVENOUS | Status: AC
  Administered 2018-12-03: 10:00:00 via INTRAVENOUS
  Filled 2018-12-03: qty 250

## 2018-12-03 MED ORDER — SODIUM CHLORIDE 0.9% FLUSH
10.0000 mL | Freq: Once | INTRAVENOUS | Status: DC
  Filled 2018-12-03: qty 10

## 2018-12-03 NOTE — Progress Notes (Signed)
Per Geraldine Solar CMA per Dr. Grayland Ormond okay to proceed with b/p 84/52

## 2018-12-03 NOTE — Progress Notes (Signed)
No new concerns today

## 2018-12-04 ENCOUNTER — Other Ambulatory Visit: Payer: Self-pay

## 2018-12-04 ENCOUNTER — Inpatient Hospital Stay: Payer: Medicare Other

## 2018-12-04 ENCOUNTER — Other Ambulatory Visit: Payer: Self-pay | Admitting: Oncology

## 2018-12-04 VITALS — BP 100/63 | HR 83 | Temp 96.3°F | Resp 18

## 2018-12-04 DIAGNOSIS — M9901 Segmental and somatic dysfunction of cervical region: Secondary | ICD-10-CM | POA: Diagnosis not present

## 2018-12-04 DIAGNOSIS — C7951 Secondary malignant neoplasm of bone: Secondary | ICD-10-CM | POA: Diagnosis not present

## 2018-12-04 DIAGNOSIS — Z5111 Encounter for antineoplastic chemotherapy: Secondary | ICD-10-CM | POA: Diagnosis not present

## 2018-12-04 DIAGNOSIS — M50122 Cervical disc disorder at C5-C6 level with radiculopathy: Secondary | ICD-10-CM | POA: Diagnosis not present

## 2018-12-04 DIAGNOSIS — M9903 Segmental and somatic dysfunction of lumbar region: Secondary | ICD-10-CM | POA: Diagnosis not present

## 2018-12-04 DIAGNOSIS — C3490 Malignant neoplasm of unspecified part of unspecified bronchus or lung: Secondary | ICD-10-CM

## 2018-12-04 DIAGNOSIS — M5417 Radiculopathy, lumbosacral region: Secondary | ICD-10-CM | POA: Diagnosis not present

## 2018-12-04 DIAGNOSIS — C787 Secondary malignant neoplasm of liver and intrahepatic bile duct: Secondary | ICD-10-CM | POA: Diagnosis not present

## 2018-12-04 MED ORDER — SODIUM CHLORIDE 0.9 % IV SOLN
Freq: Once | INTRAVENOUS | Status: AC
  Administered 2018-12-04: 09:00:00 via INTRAVENOUS
  Filled 2018-12-04: qty 250

## 2018-12-04 MED ORDER — PROCHLORPERAZINE MALEATE 10 MG PO TABS
10.0000 mg | ORAL_TABLET | Freq: Once | ORAL | Status: AC
  Administered 2018-12-04: 10 mg via ORAL
  Filled 2018-12-04: qty 1

## 2018-12-04 MED ORDER — TOPOTECAN HCL CHEMO INJECTION 4 MG
1.3000 mg/m2 | Freq: Once | INTRAVENOUS | Status: AC
  Administered 2018-12-04: 2.7 mg via INTRAVENOUS
  Filled 2018-12-04: qty 2.7

## 2018-12-04 MED ORDER — SODIUM CHLORIDE 0.9% FLUSH
10.0000 mL | INTRAVENOUS | Status: DC | PRN
  Administered 2018-12-04: 09:00:00 10 mL
  Filled 2018-12-04: qty 10

## 2018-12-04 MED ORDER — HEPARIN SOD (PORK) LOCK FLUSH 100 UNIT/ML IV SOLN
500.0000 [IU] | Freq: Once | INTRAVENOUS | Status: AC | PRN
  Administered 2018-12-04: 500 [IU]
  Filled 2018-12-04: qty 5

## 2018-12-05 ENCOUNTER — Inpatient Hospital Stay: Payer: Medicare Other

## 2018-12-05 ENCOUNTER — Other Ambulatory Visit: Payer: Self-pay

## 2018-12-05 VITALS — BP 100/67 | HR 70 | Temp 97.4°F | Resp 19

## 2018-12-05 DIAGNOSIS — C7951 Secondary malignant neoplasm of bone: Secondary | ICD-10-CM | POA: Diagnosis not present

## 2018-12-05 DIAGNOSIS — C3490 Malignant neoplasm of unspecified part of unspecified bronchus or lung: Secondary | ICD-10-CM

## 2018-12-05 DIAGNOSIS — Z5111 Encounter for antineoplastic chemotherapy: Secondary | ICD-10-CM | POA: Diagnosis not present

## 2018-12-05 DIAGNOSIS — C787 Secondary malignant neoplasm of liver and intrahepatic bile duct: Secondary | ICD-10-CM | POA: Diagnosis not present

## 2018-12-05 MED ORDER — TOPOTECAN HCL CHEMO INJECTION 4 MG
1.3000 mg/m2 | Freq: Once | INTRAVENOUS | Status: AC
  Administered 2018-12-05: 2.7 mg via INTRAVENOUS
  Filled 2018-12-05: qty 2.7

## 2018-12-05 MED ORDER — HEPARIN SOD (PORK) LOCK FLUSH 100 UNIT/ML IV SOLN
500.0000 [IU] | Freq: Once | INTRAVENOUS | Status: AC | PRN
  Administered 2018-12-05: 500 [IU]
  Filled 2018-12-05: qty 5

## 2018-12-05 MED ORDER — SODIUM CHLORIDE 0.9 % IV SOLN
Freq: Once | INTRAVENOUS | Status: AC
  Administered 2018-12-05: 10:00:00 via INTRAVENOUS
  Filled 2018-12-05: qty 250

## 2018-12-05 MED ORDER — PROCHLORPERAZINE MALEATE 10 MG PO TABS
10.0000 mg | ORAL_TABLET | Freq: Once | ORAL | Status: AC
  Administered 2018-12-05: 10 mg via ORAL
  Filled 2018-12-05: qty 1

## 2018-12-06 ENCOUNTER — Other Ambulatory Visit: Payer: Self-pay

## 2018-12-06 ENCOUNTER — Inpatient Hospital Stay: Payer: Medicare Other

## 2018-12-06 DIAGNOSIS — C787 Secondary malignant neoplasm of liver and intrahepatic bile duct: Secondary | ICD-10-CM | POA: Diagnosis not present

## 2018-12-06 DIAGNOSIS — C3490 Malignant neoplasm of unspecified part of unspecified bronchus or lung: Secondary | ICD-10-CM | POA: Diagnosis not present

## 2018-12-06 DIAGNOSIS — Z5111 Encounter for antineoplastic chemotherapy: Secondary | ICD-10-CM | POA: Diagnosis not present

## 2018-12-06 DIAGNOSIS — C7951 Secondary malignant neoplasm of bone: Secondary | ICD-10-CM | POA: Diagnosis not present

## 2018-12-06 MED ORDER — HEPARIN SOD (PORK) LOCK FLUSH 100 UNIT/ML IV SOLN
500.0000 [IU] | Freq: Once | INTRAVENOUS | Status: AC | PRN
  Administered 2018-12-06: 11:00:00 500 [IU]
  Filled 2018-12-06 (×2): qty 5

## 2018-12-06 MED ORDER — TOPOTECAN HCL CHEMO INJECTION 4 MG
1.3000 mg/m2 | Freq: Once | INTRAVENOUS | Status: AC
  Administered 2018-12-06: 11:00:00 2.7 mg via INTRAVENOUS
  Filled 2018-12-06: qty 2.7

## 2018-12-06 MED ORDER — SODIUM CHLORIDE 0.9 % IV SOLN
Freq: Once | INTRAVENOUS | Status: AC
  Administered 2018-12-06: 10:00:00 via INTRAVENOUS
  Filled 2018-12-06: qty 250

## 2018-12-06 MED ORDER — PROCHLORPERAZINE MALEATE 10 MG PO TABS
10.0000 mg | ORAL_TABLET | Freq: Once | ORAL | Status: AC
  Administered 2018-12-06: 10:00:00 10 mg via ORAL

## 2018-12-07 ENCOUNTER — Other Ambulatory Visit: Payer: Self-pay

## 2018-12-07 ENCOUNTER — Inpatient Hospital Stay: Payer: Medicare Other

## 2018-12-07 VITALS — BP 116/76 | HR 96 | Temp 97.6°F | Resp 20

## 2018-12-07 DIAGNOSIS — C7951 Secondary malignant neoplasm of bone: Secondary | ICD-10-CM | POA: Diagnosis not present

## 2018-12-07 DIAGNOSIS — Z5111 Encounter for antineoplastic chemotherapy: Secondary | ICD-10-CM | POA: Diagnosis not present

## 2018-12-07 DIAGNOSIS — C787 Secondary malignant neoplasm of liver and intrahepatic bile duct: Secondary | ICD-10-CM | POA: Diagnosis not present

## 2018-12-07 DIAGNOSIS — C3490 Malignant neoplasm of unspecified part of unspecified bronchus or lung: Secondary | ICD-10-CM

## 2018-12-07 MED ORDER — SODIUM CHLORIDE 0.9% FLUSH
10.0000 mL | INTRAVENOUS | Status: DC | PRN
  Administered 2018-12-07: 10 mL
  Filled 2018-12-07: qty 10

## 2018-12-07 MED ORDER — TOPOTECAN HCL CHEMO INJECTION 4 MG
1.3000 mg/m2 | Freq: Once | INTRAVENOUS | Status: AC
  Administered 2018-12-07: 2.7 mg via INTRAVENOUS
  Filled 2018-12-07: qty 2.7

## 2018-12-07 MED ORDER — SODIUM CHLORIDE 0.9 % IV SOLN
Freq: Once | INTRAVENOUS | Status: AC
  Administered 2018-12-07: 09:00:00 via INTRAVENOUS
  Filled 2018-12-07: qty 250

## 2018-12-07 MED ORDER — HEPARIN SOD (PORK) LOCK FLUSH 100 UNIT/ML IV SOLN
500.0000 [IU] | Freq: Once | INTRAVENOUS | Status: AC | PRN
  Administered 2018-12-07: 500 [IU]
  Filled 2018-12-07: qty 5

## 2018-12-07 MED ORDER — PROCHLORPERAZINE MALEATE 10 MG PO TABS
10.0000 mg | ORAL_TABLET | Freq: Once | ORAL | Status: AC
  Administered 2018-12-07: 10 mg via ORAL
  Filled 2018-12-07: qty 1

## 2018-12-13 ENCOUNTER — Other Ambulatory Visit: Payer: Self-pay | Admitting: *Deleted

## 2018-12-13 DIAGNOSIS — F419 Anxiety disorder, unspecified: Secondary | ICD-10-CM | POA: Diagnosis not present

## 2018-12-13 DIAGNOSIS — J449 Chronic obstructive pulmonary disease, unspecified: Secondary | ICD-10-CM | POA: Diagnosis not present

## 2018-12-13 DIAGNOSIS — E119 Type 2 diabetes mellitus without complications: Secondary | ICD-10-CM | POA: Diagnosis not present

## 2018-12-13 DIAGNOSIS — F17210 Nicotine dependence, cigarettes, uncomplicated: Secondary | ICD-10-CM | POA: Diagnosis not present

## 2018-12-13 MED ORDER — MORPHINE SULFATE ER 30 MG PO TBCR: 30.0000 mg | EXTENDED_RELEASE_TABLET | Freq: Three times a day (TID) | ORAL | 0 refills | Status: DC

## 2018-12-17 ENCOUNTER — Other Ambulatory Visit: Payer: Self-pay | Admitting: Oncology

## 2018-12-17 DIAGNOSIS — C3490 Malignant neoplasm of unspecified part of unspecified bronchus or lung: Secondary | ICD-10-CM

## 2018-12-26 ENCOUNTER — Other Ambulatory Visit: Payer: Self-pay

## 2018-12-26 ENCOUNTER — Ambulatory Visit
Admission: RE | Admit: 2018-12-26 | Discharge: 2018-12-26 | Disposition: A | Discharge status: Home / Self Care | Payer: Medicare Other | Source: Ambulatory Visit | Attending: Oncology | Admitting: Oncology

## 2018-12-26 DIAGNOSIS — C3490 Malignant neoplasm of unspecified part of unspecified bronchus or lung: Secondary | ICD-10-CM | POA: Diagnosis not present

## 2018-12-26 MED ORDER — IOHEXOL 300 MG/ML  SOLN
100.0000 mL | Freq: Once | INTRAMUSCULAR | Status: AC | PRN
  Administered 2018-12-26: 100 mL via INTRAVENOUS

## 2018-12-28 ENCOUNTER — Other Ambulatory Visit: Payer: Self-pay

## 2018-12-28 NOTE — Progress Notes (Signed)
Patient pre screened for office appointment, no questions or concerns today. Patient reminded of upcoming appointment time and date. 

## 2018-12-28 NOTE — Progress Notes (Signed)
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  INTERVAL HISTORY: Patient returns to clinic today for further evaluation, discussion of her imaging results, and consideration of cycle 14 of monthly topotecan.  She continues to have occasional nausea, but otherwise is tolerating her treatments relatively well.  She admits to recently starting smoking again, but has also started taking Chantix in order to quit. She has chronic weakness and fatigue.  She has chronic pain, but this is well controlled on her current dose of narcotics. She has no neurologic complaints.  She denies any recent fevers or illnesses.  She has a good appetite and denies weight loss.  She has chronic shortness of breath, but denies any chest pain, cough, or hemoptysis. She has no abdominal pain.  She denies any vomiting, constipation, or diarrhea.  She denies any melena or hematochezia.  She has no urinary complaints.  Patient offers no further specific complaints today.    REVIEW OF SYSTEMS:   Review of Systems  Constitutional: Positive for malaise/fatigue. Negative for fever and weight loss.  Respiratory: Positive for shortness of breath. Negative for cough, hemoptysis and wheezing.   Cardiovascular: Negative.  Negative for chest pain and leg swelling.  Gastrointestinal: Positive for nausea. Negative for abdominal pain, blood in stool, diarrhea, melena and vomiting.  Genitourinary: Negative.  Negative for dysuria and frequency.  Musculoskeletal: Positive for back pain, joint pain and neck pain.  Skin: Negative.  Negative for rash.  Neurological: Positive for weakness. Negative for sensory change, focal weakness and headaches.    Psychiatric/Behavioral: Negative.  Negative for memory loss. The patient is not nervous/anxious.     As per HPI. Otherwise, a complete review of systems is negative.  PAST MEDICAL HISTORY: Past Medical History:  Diagnosis Date   Anxiety    Arthritis    Asthma    COPD   CAP (community acquired pneumonia) 06/30/2014   Cardiomyopathy ([place])    nonischemic (EF 35-40%)   CHF (congestive heart failure) (H[facility])    COPD (chronic obstructive pulmonary disease) (HCC)    Depression    Dyspnea    GERD (gastroesophageal reflux disease)    Hyperlipidemia    Hypertension    Pneumonia    PVC's (premature ventricular contractions)    states 10 years ago   Sepsis ([place]) 06/30/2014   Small cell lung cancer ([place]) 12/2016   Chemo tx's.   SOB (shortness of breath) 05/08/2014   Spinal cord injury at T7-T12 level [facility])    2016    PAST SURGICAL HISTORY: Past Surgical History:  Procedure Laterality Date   APPENDECTOMY     CARDIAC CATHETERIZATION     CATARACT EXTRACTION     right eye    CESAREAN SECTION     COLONOSCOPY     LAMINOTOMY     LUMBAR LAMINECTOMY/DECOMPRESSION MICRODISCECTOMY Left 08/01/2016   Procedure: Left Lumbar Four-Five Laminectomy and Foraminotomy;  Surgeon: Earnie Larsson, MD;  Location: [facility];  Service: Neurosurgery;  Laterality: Left;   PORTA CATH INSERTION N/A 02/03/2017   Procedure: PORTA CATH INSERTION;  Surgeon: Katha Cabal, MD;  Location: [facility];  Service: Cardiovascular;  Laterality: N/A;    FAMILY HISTORY: Family History  Problem Relation Age of  Onset   Heart attack Father 43       MI x 3     ADVANCED DIRECTIVES (Y/N):  N  HEALTH MAINTENANCE: Social History   Tobacco Use   Smoking status: Former Smoker    Packs/day: 0.50    Years: 50.00    Pack years: 25.00    Types: Cigarettes    Quit date: 01/27/2015    Years since quitting: 3.9   Smokeless tobacco: Never Used  Substance Use Topics   Alcohol use: No    Drug use: No     Colonoscopy:  PAP:  Bone density:  Lipid panel:  Allergies  Allergen Reactions   Fish Allergy Anaphylaxis    Per pt after eating [facility] she had throat swelling and SOB. MSY  Per pt after eating [facility] she had throat swelling and SOB. MSY    Fentanyl     Overly sedated and groggy, ineffective     Current Outpatient Medications  Medication Sig Dispense Refill   ADVAIR DISKUS 250-50 MCG/DOSE AEPB Inhale 1 puff into the lungs 2 (two) times daily.     albuterol (PROAIR HFA) 108 (90 Base) MCG/ACT inhaler Inhale 2 puffs into the lungs every 6 (six) hours as needed for wheezing or shortness of breath.      alendronate (FOSAMAX) 70 MG tablet Take 70 mg by mouth every Thursday.      ARIPiprazole (ABILIFY) 5 MG tablet Take 5 mg by mouth daily.     BELBUCA 150 MCG FILM Take 1 tablet by mouth daily.  0   benzonatate (TESSALON) 100 MG capsule Take 1 capsule (100 mg total) by mouth every 8 (eight) hours as needed for cough. 20 capsule 0   dextromethorphan-guaiFENesin (MUCINEX DM) 30-600 MG 12hr tablet Take 1 tablet by mouth 2 (two) times daily as needed for cough.     diphenoxylate-atropine (LOMOTIL) 2.5-0.025 MG tablet Take 1 tablet by mouth 4 (four) times daily as needed for diarrhea or loose stools. 30 tablet 0   DULoxetine (CYMBALTA) 60 MG capsule Take 60 mg by mouth daily.     ipratropium-albuterol (DUONEB) 0.5-2.5 (3) MG/3ML SOLN Take 3 mLs by nebulization every 4 (four) hours as needed. 360 mL 1   LORazepam (ATIVAN) 0.5 MG tablet Take 0.5 mg by mouth as needed.     metoprolol succinate (TOPROL-XL) 100 MG 24 hr tablet Take 1 tablet by mouth 1 day or 1 dose.     morphine (MS CONTIN) 30 MG 12 hr tablet Take 1 tablet (30 mg total) by mouth every 8 (eight) hours. 90 tablet 0   pantoprazole (PROTONIX) 40 MG tablet Take 1 tablet by mouth daily.     potassium chloride SA (K-DUR,KLOR-CON) 20 MEQ tablet Take 1 tablet (20 mEq total) by mouth daily. 30 tablet 2     prochlorperazine (COMPAZINE) 10 MG tablet TAKE 1 TABLET (10 MG TOTAL) BY MOUTH EVERY 6 (SIX) HOURS AS NEEDED (NAUSEA OR VOMITING). 60 tablet 2   promethazine (PHENERGAN) 25 MG tablet Take 1 tablet (25 mg total) by mouth every 8 (eight) hours as needed for nausea or vomiting. 30 tablet 2   sacubitril-valsartan (ENTRESTO) 24-26 MG Take 1 tablet by mouth 2 (two) times daily. 180 tablet 3   tiZANidine (ZANAFLEX) 4 MG tablet Take 1 tablet (4 mg total) by mouth every 8 (eight) hours as needed for muscle spasms. 90 tablet 2   varenicline (CHANTIX) 1 MG tablet Take 1 mg by mouth 2 (two) times daily.  No current facility-administered medications for this visit.    Facility-Administered Medications Ordered in Other Visits  Medication Dose Route Frequency Provider Last Rate Last Dose   0.9 %  sodium chloride infusion   Intravenous Once Grayland Ormond, Kathlene November, MD       heparin lock flush 100 unit/mL  500 Units Intravenous Once Lloyd Huger, MD       prochlorperazine (COMPAZINE) tablet 10 mg  10 mg Oral Once Lloyd Huger, MD       sodium chloride flush (NS) 0.9 % injection 10 mL  10 mL Intravenous PRN Lloyd Huger, MD   10 mL at 03/01/17 0841   topotecan (HYCAMTIN) 2.7 mg in sodium chloride 0.9 % 100 mL chemo infusion  1.3 mg/m2 (Treatment Plan Recorded) Intravenous Once Lloyd Huger, MD        OBJECTIVE: Vitals:   12/31/18 0931  BP: 98/63  Pulse: 72  Temp: (!) 95.9 F (35.5 C)  SpO2: 95%     Body mass index is 34.48 kg/m.    ECOG FS:1 - Symptomatic but completely ambulatory  General: Well-developed, well-nourished, no acute distress.  Sitting in a wheelchair. Eyes: [name] conjunctiva, anicteric sclera. HEENT: Normocephalic, moist mucous membranes, clear oropharnyx. Lungs: Clear to auscultation bilaterally. Heart: Regular rate and rhythm. No rubs, murmurs, or gallops. Abdomen: Soft, nontender, nondistended. No organomegaly noted, normoactive bowel  sounds. Musculoskeletal: No edema, cyanosis, or clubbing. Neuro: Alert, answering all questions appropriately. Cranial nerves grossly intact. Skin: No rashes or petechiae noted. Psych: Normal affect.  LAB RESULTS:  Lab Results  Component Value Date   NA 134 (L) 12/31/2018   K 3.9 12/31/2018   CL 103 12/31/2018   CO2 26 12/31/2018   GLUCOSE 142 (H) 12/31/2018   BUN 13 12/31/2018   CREATININE 0.66 12/31/2018   CALCIUM 8.6 (L) 12/31/2018   PROT 7.9 12/31/2018   ALBUMIN 3.5 12/31/2018   AST 42 (H) 12/31/2018   ALT 39 12/31/2018   ALKPHOS 96 12/31/2018   BILITOT 0.4 12/31/2018   GFRNONAA >60 12/31/2018   GFRAA >60 12/31/2018    Lab Results  Component Value Date   WBC 6.4 12/31/2018   NEUTROABS 4.0 12/31/2018   HGB 10.6 (L) 12/31/2018   HCT 36.1 12/31/2018   MCV 93.5 12/31/2018   PLT 317 12/31/2018     STUDIES: Ct Chest W Contrast  Result Date: 12/26/2018 CLINICAL DATA:  Metastatic lung cancer restaging EXAM: CT CHEST, ABDOMEN, AND PELVIS WITH CONTRAST TECHNIQUE: Multidetector CT imaging of the chest, abdomen and pelvis was performed following the standard protocol during bolus administration of intravenous contrast. CONTRAST:  199mL OMNIPAQUE IOHEXOL 300 MG/ML SOLN, additional oral enteric contrast COMPARISON:  10/04/2018, 06/15/2018 FINDINGS: CT CHEST FINDINGS Cardiovascular: Right chest port catheter. Aortic atherosclerosis. Normal heart size. Three-vessel coronary artery calcifications. No pericardial effusion. Mediastinum/Nodes: No enlarged mediastinal, hilar, or axillary lymph nodes. Thyroid gland, trachea, and esophagus demonstrate no significant findings. Lungs/Pleura: Mild centrilobular emphysema. Stable appearance of a lobulated mass of the suprahilar left upper lobe measuring 2.7 x 2.4 cm (series 3, image 43). Unchanged small pulmonary nodules, the largest an irregular nodule of the right lower lobe measuring 1.0 x 0.6 cm (series 3, image 71). No pleural effusion or  pneumothorax. Musculoskeletal: No chest wall mass. CT ABDOMEN PELVIS FINDINGS Hepatobiliary: Hepatic steatosis. No change in retracted, post treatment appearance of multiple hepatic metastatic lesions, of the anterior left lobe (series 2, image 58) and right liver dome (series 2, image 47). Index lesion  of the left lobe measures 1.6 x 1.5 cm (series 2, image 58). No gallstones or gallbladder wall thickening. Unchanged dilatation of the common bile duct without obstructing lesion appreciated. Pancreas: Unremarkable. No pancreatic ductal dilatation or surrounding inflammatory changes. Spleen: Normal in size without significant abnormality. Adrenals/Urinary Tract: Adrenal glands are unremarkable. Kidneys are normal, without renal calculi, solid lesion, or hydronephrosis. Bladder is unremarkable. Stomach/Bowel: Stomach is within normal limits. No evidence of bowel wall thickening, distention, or inflammatory changes. Vascular/Lymphatic: Severe aortic atherosclerosis with irregular mural thrombus. No enlarged abdominal or pelvic lymph nodes. Reproductive: No mass or other abnormality. Other: No abdominal wall hernia or abnormality. No abdominopelvic ascites. Musculoskeletal: No change in multiple sclerotic osseous metastatic lesions of the vertebral bodies and bilateral ilia. High-grade vertebral plana deformity of T5. IMPRESSION: 1. Stable appearance of a lobulated mass of the suprahilar left upper lobe measuring 2.7 x 2.4 cm (series 3, image 43). 2. Unchanged small pulmonary nodules, the largest an irregular nodule of the right lower lobe measuring 1.0 x 0.6 cm (series 3, image 71). Attention on follow-up. 3. No change in retracted, post treatment appearance of multiple hepatic metastatic lesions, of the anterior left lobe (series 2, image 58) and right liver dome (series 2, image 47). Index lesion of the left lobe measures 1.6 x 1.5 cm (series 2, image 58). 4. No change in multiple sclerotic osseous metastatic lesions  of the vertebral bodies and bilateral ilia. 5. Unchanged biliary ductal dilatation without distally obstructing lesion appreciated. 6.  Emphysema (ICD10-J43.9). 7.  Coronary artery disease. Aortic Atherosclerosis (ICD10-I70.0). Electronically Signed   By: Eddie Candle M.D.   On: 12/26/2018 13:01   Ct Abdomen Pelvis W Contrast  Result Date: 12/26/2018 CLINICAL DATA:  Metastatic lung cancer restaging EXAM: CT CHEST, ABDOMEN, AND PELVIS WITH CONTRAST TECHNIQUE: Multidetector CT imaging of the chest, abdomen and pelvis was performed following the standard protocol during bolus administration of intravenous contrast. CONTRAST:  124mL OMNIPAQUE IOHEXOL 300 MG/ML SOLN, additional oral enteric contrast COMPARISON:  10/04/2018, 06/15/2018 FINDINGS: CT CHEST FINDINGS Cardiovascular: Right chest port catheter. Aortic atherosclerosis. Normal heart size. Three-vessel coronary artery calcifications. No pericardial effusion. Mediastinum/Nodes: No enlarged mediastinal, hilar, or axillary lymph nodes. Thyroid gland, trachea, and esophagus demonstrate no significant findings. Lungs/Pleura: Mild centrilobular emphysema. Stable appearance of a lobulated mass of the suprahilar left upper lobe measuring 2.7 x 2.4 cm (series 3, image 43). Unchanged small pulmonary nodules, the largest an irregular nodule of the right lower lobe measuring 1.0 x 0.6 cm (series 3, image 71). No pleural effusion or pneumothorax. Musculoskeletal: No chest wall mass. CT ABDOMEN PELVIS FINDINGS Hepatobiliary: Hepatic steatosis. No change in retracted, post treatment appearance of multiple hepatic metastatic lesions, of the anterior left lobe (series 2, image 58) and right liver dome (series 2, image 47). Index lesion of the left lobe measures 1.6 x 1.5 cm (series 2, image 58). No gallstones or gallbladder wall thickening. Unchanged dilatation of the common bile duct without obstructing lesion appreciated. Pancreas: Unremarkable. No pancreatic ductal  dilatation or surrounding inflammatory changes. Spleen: Normal in size without significant abnormality. Adrenals/Urinary Tract: Adrenal glands are unremarkable. Kidneys are normal, without renal calculi, solid lesion, or hydronephrosis. Bladder is unremarkable. Stomach/Bowel: Stomach is within normal limits. No evidence of bowel wall thickening, distention, or inflammatory changes. Vascular/Lymphatic: Severe aortic atherosclerosis with irregular mural thrombus. No enlarged abdominal or pelvic lymph nodes. Reproductive: No mass or other abnormality. Other: No abdominal wall hernia or abnormality. No abdominopelvic ascites. Musculoskeletal: No change in  multiple sclerotic osseous metastatic lesions of the vertebral bodies and bilateral ilia. High-grade vertebral plana deformity of T5. IMPRESSION: 1. Stable appearance of a lobulated mass of the suprahilar left upper lobe measuring 2.7 x 2.4 cm (series 3, image 43). 2. Unchanged small pulmonary nodules, the largest an irregular nodule of the right lower lobe measuring 1.0 x 0.6 cm (series 3, image 71). Attention on follow-up. 3. No change in retracted, post treatment appearance of multiple hepatic metastatic lesions, of the anterior left lobe (series 2, image 58) and right liver dome (series 2, image 47). Index lesion of the left lobe measures 1.6 x 1.5 cm (series 2, image 58). 4. No change in multiple sclerotic osseous metastatic lesions of the vertebral bodies and bilateral ilia. 5. Unchanged biliary ductal dilatation without distally obstructing lesion appreciated. 6.  Emphysema (ICD10-J43.9). 7.  Coronary artery disease. Aortic Atherosclerosis (ICD10-I70.0). Electronically Signed   By: Eddie Candle M.D.   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  PLAN:  1. 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  Patient then received maintenance Tecentriq from Jun 21, 2017 through November 15, 2017.  CT scan results from December 26, 2018 reviewed independently and report as above with essentially stable disease.  Previously, hospice was discussed, patient wished to pursue aggressive treatment.  She was initially scheduled to get topotecan on days 1 through 5 every 21 days, but given her persistent thrombocytopenia she can only receive treatment every 28 days.  Proceed with cycle 14, day 1 of dose reduced topotecan today.  Return to clinic Tuesday through Friday for topotecan only.  Return to clinic in 4 weeks for further evaluation and consideration of cycle 15. 2. Pain: Continue MS Contin to 30 mg every 8 hours. Patient states she has "fired" her pain doctor.   3. Thrombocytopenia: Resolved.  Proceed with treatment every 28 days as above. 4.  Anemia: Hemoglobin slightly improved to 10.6. 5.  Neutropenia: Resolved.   6.  COPD: Chronic and unchanged.  Continue outpatient palliative care.  Continue current medications as prescribed. 7.  Hypokalemia: Resolved.  Continue oral potassium supplementation. 8.  Disposition: Hospice and end-of-life care were previously discussed, but patient wishes to continue aggressive treatments.  She expressed understanding that her treatment options are limited.  She confirms that she is a DNR and is having outpatient palliative care visit. 9.  Tobacco use: Patient states she recently started smoking again.  She expressed understanding that this may increase her risk of recurrence.  She also recently started Chantix in order to quit again.   Patient expressed understanding and was in agreement with this plan. She also understands that She can call clinic at any time with any questions, concerns, or complaints.   Cancer Staging Small cell lung cancer [facility]) Staging form: Lung, AJCC 8th Edition - Clinical stage from 01/28/2017:  Stage IV (cT4, cN3, pM1c) - Signed by Lloyd Huger, MD on 01/28/2017   Lloyd Huger, MD   12/31/2018 10:07 AM

## 2018-12-30 ENCOUNTER — Other Ambulatory Visit: Payer: Self-pay

## 2018-12-30 DIAGNOSIS — D696 Thrombocytopenia, unspecified: Secondary | ICD-10-CM

## 2018-12-31 ENCOUNTER — Inpatient Hospital Stay: Payer: Medicare Other

## 2018-12-31 ENCOUNTER — Other Ambulatory Visit: Payer: Self-pay

## 2018-12-31 ENCOUNTER — Inpatient Hospital Stay: Payer: Medicare Other | Attending: Oncology

## 2018-12-31 ENCOUNTER — Inpatient Hospital Stay (HOSPITAL_BASED_OUTPATIENT_CLINIC_OR_DEPARTMENT_OTHER): Payer: Medicare Other | Admitting: Oncology

## 2018-12-31 VITALS — BP 98/63 | HR 72 | Temp 95.9°F | Wt 207.2 lb

## 2018-12-31 DIAGNOSIS — J449 Chronic obstructive pulmonary disease, unspecified: Secondary | ICD-10-CM | POA: Diagnosis not present

## 2018-12-31 DIAGNOSIS — R5383 Other fatigue: Secondary | ICD-10-CM | POA: Diagnosis not present

## 2018-12-31 DIAGNOSIS — K219 Gastro-esophageal reflux disease without esophagitis: Secondary | ICD-10-CM | POA: Insufficient documentation

## 2018-12-31 DIAGNOSIS — D696 Thrombocytopenia, unspecified: Secondary | ICD-10-CM

## 2018-12-31 DIAGNOSIS — D509 Iron deficiency anemia, unspecified: Secondary | ICD-10-CM | POA: Insufficient documentation

## 2018-12-31 DIAGNOSIS — I509 Heart failure, unspecified: Secondary | ICD-10-CM | POA: Diagnosis not present

## 2018-12-31 DIAGNOSIS — C787 Secondary malignant neoplasm of liver and intrahepatic bile duct: Secondary | ICD-10-CM | POA: Diagnosis not present

## 2018-12-31 DIAGNOSIS — R531 Weakness: Secondary | ICD-10-CM | POA: Diagnosis not present

## 2018-12-31 DIAGNOSIS — R110 Nausea: Secondary | ICD-10-CM | POA: Diagnosis not present

## 2018-12-31 DIAGNOSIS — I513 Intracardiac thrombosis, not elsewhere classified: Secondary | ICD-10-CM | POA: Insufficient documentation

## 2018-12-31 DIAGNOSIS — C3490 Malignant neoplasm of unspecified part of unspecified bronchus or lung: Secondary | ICD-10-CM | POA: Insufficient documentation

## 2018-12-31 DIAGNOSIS — I10 Essential (primary) hypertension: Secondary | ICD-10-CM | POA: Diagnosis not present

## 2018-12-31 DIAGNOSIS — C7951 Secondary malignant neoplasm of bone: Secondary | ICD-10-CM | POA: Diagnosis not present

## 2018-12-31 DIAGNOSIS — I2510 Atherosclerotic heart disease of native coronary artery without angina pectoris: Secondary | ICD-10-CM | POA: Diagnosis not present

## 2018-12-31 DIAGNOSIS — I700 Atherosclerosis of aorta: Secondary | ICD-10-CM | POA: Insufficient documentation

## 2018-12-31 DIAGNOSIS — Z5111 Encounter for antineoplastic chemotherapy: Secondary | ICD-10-CM | POA: Insufficient documentation

## 2018-12-31 DIAGNOSIS — Z87891 Personal history of nicotine dependence: Secondary | ICD-10-CM | POA: Insufficient documentation

## 2018-12-31 DIAGNOSIS — M129 Arthropathy, unspecified: Secondary | ICD-10-CM | POA: Insufficient documentation

## 2018-12-31 DIAGNOSIS — E785 Hyperlipidemia, unspecified: Secondary | ICD-10-CM | POA: Diagnosis not present

## 2018-12-31 DIAGNOSIS — F418 Other specified anxiety disorders: Secondary | ICD-10-CM | POA: Diagnosis not present

## 2018-12-31 DIAGNOSIS — Z79899 Other long term (current) drug therapy: Secondary | ICD-10-CM | POA: Insufficient documentation

## 2018-12-31 LAB — COMPREHENSIVE METABOLIC PANEL
ALT: 39 U/L (ref 0–44)
AST: 42 U/L — ABNORMAL HIGH (ref 15–41)
Albumin: 3.5 g/dL (ref 3.5–5.0)
Alkaline Phosphatase: 96 U/L (ref 38–126)
Anion gap: 5 (ref 5–15)
BUN: 13 mg/dL (ref 8–23)
CO2: 26 mmol/L (ref 22–32)
Calcium: 8.6 mg/dL — ABNORMAL LOW (ref 8.9–10.3)
Chloride: 103 mmol/L (ref 98–111)
Creatinine, Ser: 0.66 mg/dL (ref 0.44–1.00)
GFR calc Af Amer: >60 mL/min (ref >60)
GFR calc non Af Amer: >60 mL/min (ref >60)
Glucose, Bld: 142 mg/dL — ABNORMAL HIGH (ref 70–99)
Potassium: 3.9 mmol/L (ref 3.5–5.1)
Sodium: 134 mmol/L — ABNORMAL LOW (ref 135–145)
Total Bilirubin: 0.4 mg/dL (ref 0.3–1.2)
Total Protein: 7.9 g/dL (ref 6.5–8.1)

## 2018-12-31 LAB — CBC WITH DIFFERENTIAL/PLATELET
Abs Immature Granulocytes: 0.03 10*3/uL (ref 0.00–0.07)
Basophils Absolute: 0 10*3/uL (ref 0.0–0.1)
Basophils Relative: 0 %
Eosinophils Absolute: 0 10*3/uL (ref 0.0–0.5)
Eosinophils Relative: 0 %
HCT: 36.1 % (ref 36.0–46.0)
Hemoglobin: 10.6 g/dL — ABNORMAL LOW (ref 12.0–15.0)
Immature Granulocytes: 1 %
Lymphocytes Relative: 26 %
Lymphs Abs: 1.7 10*3/uL (ref 0.7–4.0)
MCH: 27.5 pg (ref 26.0–34.0)
MCHC: 29.4 g/dL — ABNORMAL LOW (ref 30.0–36.0)
MCV: 93.5 fL (ref 80.0–100.0)
Monocytes Absolute: 0.7 10*3/uL (ref 0.1–1.0)
Monocytes Relative: 11 %
Neutro Abs: 4 10*3/uL (ref 1.7–7.7)
Neutrophils Relative %: 62 %
Platelets: 317 10*3/uL (ref 150–400)
RBC: 3.86 MIL/uL — ABNORMAL LOW (ref 3.87–5.11)
RDW: 21.8 % — ABNORMAL HIGH (ref 11.5–15.5)
WBC: 6.4 10*3/uL (ref 4.0–10.5)
nRBC: 0 % (ref 0.0–0.2)

## 2018-12-31 MED ORDER — PROCHLORPERAZINE MALEATE 10 MG PO TABS
10.0000 mg | ORAL_TABLET | Freq: Once | ORAL | Status: AC
  Administered 2018-12-31: 10 mg via ORAL
  Filled 2018-12-31: qty 1

## 2018-12-31 MED ORDER — SODIUM CHLORIDE 0.9 % IV SOLN
Freq: Once | INTRAVENOUS | Status: AC
  Administered 2018-12-31: 10:00:00 via INTRAVENOUS
  Filled 2018-12-31: qty 250

## 2018-12-31 MED ORDER — HEPARIN SOD (PORK) LOCK FLUSH 100 UNIT/ML IV SOLN
500.0000 [IU] | Freq: Once | INTRAVENOUS | Status: AC
  Administered 2018-12-31: 500 [IU] via INTRAVENOUS
  Filled 2018-12-31: qty 5

## 2018-12-31 MED ORDER — TOPOTECAN HCL CHEMO INJECTION 4 MG
1.3000 mg/m2 | Freq: Once | INTRAVENOUS | Status: AC
  Administered 2018-12-31: 2.7 mg via INTRAVENOUS
  Filled 2018-12-31: qty 2.7

## 2018-12-31 MED ORDER — SODIUM CHLORIDE 0.9% FLUSH
10.0000 mL | Freq: Once | INTRAVENOUS | Status: AC
  Administered 2018-12-31: 10 mL via INTRAVENOUS
  Filled 2018-12-31: qty 10

## 2018-12-31 NOTE — Progress Notes (Signed)
Pt reports nausea, denies vomiting. States she feels good today.

## 2019-01-01 ENCOUNTER — Other Ambulatory Visit: Payer: Self-pay

## 2019-01-01 ENCOUNTER — Ambulatory Visit: Payer: Medicare Other

## 2019-01-01 ENCOUNTER — Inpatient Hospital Stay: Payer: Medicare Other

## 2019-01-01 VITALS — BP 121/69 | HR 82 | Temp 95.7°F | Resp 20

## 2019-01-01 DIAGNOSIS — C3490 Malignant neoplasm of unspecified part of unspecified bronchus or lung: Secondary | ICD-10-CM

## 2019-01-01 DIAGNOSIS — D509 Iron deficiency anemia, unspecified: Secondary | ICD-10-CM | POA: Diagnosis not present

## 2019-01-01 DIAGNOSIS — Z5111 Encounter for antineoplastic chemotherapy: Secondary | ICD-10-CM | POA: Diagnosis not present

## 2019-01-01 DIAGNOSIS — J449 Chronic obstructive pulmonary disease, unspecified: Secondary | ICD-10-CM | POA: Diagnosis not present

## 2019-01-01 DIAGNOSIS — C787 Secondary malignant neoplasm of liver and intrahepatic bile duct: Secondary | ICD-10-CM | POA: Diagnosis not present

## 2019-01-01 DIAGNOSIS — C7951 Secondary malignant neoplasm of bone: Secondary | ICD-10-CM | POA: Diagnosis not present

## 2019-01-01 MED ORDER — PROCHLORPERAZINE MALEATE 10 MG PO TABS
10.0000 mg | ORAL_TABLET | Freq: Once | ORAL | Status: AC
  Administered 2019-01-01: 10 mg via ORAL
  Filled 2019-01-01: qty 1

## 2019-01-01 MED ORDER — TOPOTECAN HCL CHEMO INJECTION 4 MG
1.3000 mg/m2 | Freq: Once | INTRAVENOUS | Status: AC
  Administered 2019-01-01: 10:00:00 2.7 mg via INTRAVENOUS
  Filled 2019-01-01: qty 2.7

## 2019-01-01 MED ORDER — HEPARIN SOD (PORK) LOCK FLUSH 100 UNIT/ML IV SOLN
500.0000 [IU] | Freq: Once | INTRAVENOUS | Status: AC | PRN
  Administered 2019-01-01: 500 [IU]
  Filled 2019-01-01: qty 5

## 2019-01-01 MED ORDER — SODIUM CHLORIDE 0.9 % IV SOLN
Freq: Once | INTRAVENOUS | Status: AC
  Administered 2019-01-01: 10:00:00 via INTRAVENOUS
  Filled 2019-01-01: qty 250

## 2019-01-01 NOTE — Progress Notes (Signed)
3868: port flushes without difficulty, no pain, redness or swelling noted, no blood return noted, MD aware, Per Dr. Grayland Ormond okay to proceed with treatment.

## 2019-01-02 ENCOUNTER — Other Ambulatory Visit: Payer: Self-pay

## 2019-01-02 ENCOUNTER — Inpatient Hospital Stay: Payer: Medicare Other

## 2019-01-02 VITALS — BP 93/64 | HR 64 | Temp 96.8°F | Resp 20

## 2019-01-02 DIAGNOSIS — C3490 Malignant neoplasm of unspecified part of unspecified bronchus or lung: Secondary | ICD-10-CM

## 2019-01-02 DIAGNOSIS — J449 Chronic obstructive pulmonary disease, unspecified: Secondary | ICD-10-CM | POA: Diagnosis not present

## 2019-01-02 DIAGNOSIS — D509 Iron deficiency anemia, unspecified: Secondary | ICD-10-CM | POA: Diagnosis not present

## 2019-01-02 DIAGNOSIS — C7951 Secondary malignant neoplasm of bone: Secondary | ICD-10-CM | POA: Diagnosis not present

## 2019-01-02 DIAGNOSIS — Z5111 Encounter for antineoplastic chemotherapy: Secondary | ICD-10-CM | POA: Diagnosis not present

## 2019-01-02 DIAGNOSIS — C787 Secondary malignant neoplasm of liver and intrahepatic bile duct: Secondary | ICD-10-CM | POA: Diagnosis not present

## 2019-01-02 MED ORDER — SODIUM CHLORIDE 0.9 % IV SOLN
Freq: Once | INTRAVENOUS | Status: AC
  Administered 2019-01-02: 09:00:00 via INTRAVENOUS
  Filled 2019-01-02: qty 250

## 2019-01-02 MED ORDER — SODIUM CHLORIDE 0.9% FLUSH
10.0000 mL | INTRAVENOUS | Status: DC | PRN
  Administered 2019-01-02: 10 mL
  Filled 2019-01-02: qty 10

## 2019-01-02 MED ORDER — TOPOTECAN HCL CHEMO INJECTION 4 MG
1.3000 mg/m2 | Freq: Once | INTRAVENOUS | Status: AC
  Administered 2019-01-02: 2.7 mg via INTRAVENOUS
  Filled 2019-01-02: qty 2.7

## 2019-01-02 MED ORDER — HEPARIN SOD (PORK) LOCK FLUSH 100 UNIT/ML IV SOLN
500.0000 [IU] | Freq: Once | INTRAVENOUS | Status: AC | PRN
  Administered 2019-01-02: 500 [IU]
  Filled 2019-01-02: qty 5

## 2019-01-02 MED ORDER — PROCHLORPERAZINE MALEATE 10 MG PO TABS
10.0000 mg | ORAL_TABLET | Freq: Once | ORAL | Status: AC
  Administered 2019-01-02: 10 mg via ORAL
  Filled 2019-01-02: qty 1

## 2019-01-03 ENCOUNTER — Inpatient Hospital Stay: Payer: Medicare Other

## 2019-01-03 ENCOUNTER — Other Ambulatory Visit: Payer: Self-pay

## 2019-01-03 VITALS — BP 114/80 | HR 81 | Temp 95.0°F | Resp 20

## 2019-01-03 DIAGNOSIS — Z5111 Encounter for antineoplastic chemotherapy: Secondary | ICD-10-CM | POA: Diagnosis not present

## 2019-01-03 DIAGNOSIS — C3490 Malignant neoplasm of unspecified part of unspecified bronchus or lung: Secondary | ICD-10-CM

## 2019-01-03 DIAGNOSIS — Z95828 Presence of other vascular implants and grafts: Secondary | ICD-10-CM

## 2019-01-03 DIAGNOSIS — C7951 Secondary malignant neoplasm of bone: Secondary | ICD-10-CM | POA: Diagnosis not present

## 2019-01-03 DIAGNOSIS — C787 Secondary malignant neoplasm of liver and intrahepatic bile duct: Secondary | ICD-10-CM | POA: Diagnosis not present

## 2019-01-03 DIAGNOSIS — D509 Iron deficiency anemia, unspecified: Secondary | ICD-10-CM | POA: Diagnosis not present

## 2019-01-03 DIAGNOSIS — J449 Chronic obstructive pulmonary disease, unspecified: Secondary | ICD-10-CM | POA: Diagnosis not present

## 2019-01-03 MED ORDER — SODIUM CHLORIDE 0.9 % IV SOLN
Freq: Once | INTRAVENOUS | Status: AC
  Administered 2019-01-03: 09:00:00 via INTRAVENOUS
  Filled 2019-01-03: qty 250

## 2019-01-03 MED ORDER — HEPARIN SOD (PORK) LOCK FLUSH 100 UNIT/ML IV SOLN
500.0000 [IU] | Freq: Once | INTRAVENOUS | Status: AC | PRN
  Administered 2019-01-03: 500 [IU]
  Filled 2019-01-03: qty 5

## 2019-01-03 MED ORDER — TOPOTECAN HCL CHEMO INJECTION 4 MG
1.3000 mg/m2 | Freq: Once | INTRAVENOUS | Status: AC
  Administered 2019-01-03: 2.7 mg via INTRAVENOUS
  Filled 2019-01-03: qty 2.7

## 2019-01-03 MED ORDER — PROCHLORPERAZINE MALEATE 10 MG PO TABS
10.0000 mg | ORAL_TABLET | Freq: Once | ORAL | Status: AC
  Administered 2019-01-03: 10 mg via ORAL
  Filled 2019-01-03: qty 1

## 2019-01-03 MED ORDER — SODIUM CHLORIDE 0.9% FLUSH
10.0000 mL | INTRAVENOUS | Status: DC | PRN
  Administered 2019-01-03: 10 mL via INTRAVENOUS
  Filled 2019-01-03: qty 10

## 2019-01-03 MED ORDER — SODIUM CHLORIDE 0.9% FLUSH
10.0000 mL | INTRAVENOUS | Status: DC | PRN
  Administered 2019-01-03: 10 mL
  Filled 2019-01-03: qty 10

## 2019-01-03 NOTE — Progress Notes (Signed)
Port does not yield blood return today. Port flushes without difficulty. No swelling, redness, or pain at port site. MD, Dr. Grayland Ormond, notified. Per MD order: port can be used for treatment today.

## 2019-01-04 ENCOUNTER — Other Ambulatory Visit: Payer: Self-pay

## 2019-01-04 ENCOUNTER — Inpatient Hospital Stay: Payer: Medicare Other

## 2019-01-04 VITALS — BP 108/64 | HR 66 | Temp 96.4°F | Resp 18

## 2019-01-04 DIAGNOSIS — Z5111 Encounter for antineoplastic chemotherapy: Secondary | ICD-10-CM | POA: Diagnosis not present

## 2019-01-04 DIAGNOSIS — C787 Secondary malignant neoplasm of liver and intrahepatic bile duct: Secondary | ICD-10-CM | POA: Diagnosis not present

## 2019-01-04 DIAGNOSIS — C3490 Malignant neoplasm of unspecified part of unspecified bronchus or lung: Secondary | ICD-10-CM

## 2019-01-04 DIAGNOSIS — J449 Chronic obstructive pulmonary disease, unspecified: Secondary | ICD-10-CM | POA: Diagnosis not present

## 2019-01-04 DIAGNOSIS — C7951 Secondary malignant neoplasm of bone: Secondary | ICD-10-CM | POA: Diagnosis not present

## 2019-01-04 DIAGNOSIS — D509 Iron deficiency anemia, unspecified: Secondary | ICD-10-CM | POA: Diagnosis not present

## 2019-01-04 MED ORDER — HEPARIN SOD (PORK) LOCK FLUSH 100 UNIT/ML IV SOLN
500.0000 [IU] | Freq: Once | INTRAVENOUS | Status: AC | PRN
  Administered 2019-01-04: 500 [IU]
  Filled 2019-01-04: qty 5

## 2019-01-04 MED ORDER — PROCHLORPERAZINE MALEATE 10 MG PO TABS
10.0000 mg | ORAL_TABLET | Freq: Once | ORAL | Status: AC
  Administered 2019-01-04: 10 mg via ORAL
  Filled 2019-01-04: qty 1

## 2019-01-04 MED ORDER — TOPOTECAN HCL CHEMO INJECTION 4 MG
1.3000 mg/m2 | Freq: Once | INTRAVENOUS | Status: AC
  Administered 2019-01-04: 2.7 mg via INTRAVENOUS
  Filled 2019-01-04: qty 2.7

## 2019-01-04 MED ORDER — SODIUM CHLORIDE 0.9 % IV SOLN
Freq: Once | INTRAVENOUS | Status: AC
  Administered 2019-01-04: 10:00:00 via INTRAVENOUS
  Filled 2019-01-04: qty 250

## 2019-01-04 NOTE — Progress Notes (Signed)
0930: port flushes without difficulty, no redness, pain or swelling noted. No blood return noted from port. MD aware. Per Dr. Grayland Ormond okay to proceed with treatment with no blood return noted from port.  1023: pt denies pain at port site throughout treatment, pt continues to flush without difficulty, no redness or swelling noted.

## 2019-01-16 ENCOUNTER — Other Ambulatory Visit: Payer: Self-pay | Admitting: Nurse Practitioner

## 2019-01-17 MED ORDER — MORPHINE SULFATE ER 30 MG PO TBCR: 30.0000 mg | EXTENDED_RELEASE_TABLET | Freq: Three times a day (TID) | ORAL | 0 refills | Status: DC

## 2019-01-18 ENCOUNTER — Other Ambulatory Visit: Payer: Self-pay

## 2019-01-25 ENCOUNTER — Other Ambulatory Visit: Payer: Self-pay

## 2019-01-25 NOTE — Progress Notes (Signed)
Patient pre screened for office appointment, no questions or concerns today. Patient reminded of upcoming appointment time and date. 

## 2019-01-27 NOTE — Progress Notes (Signed)
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  INTERVAL HISTORY: Patient returns to clinic today for further evaluation and consideration of cycle 15 of monthly topotecan.  She continues to have chronic weakness and fatigue, but otherwise feels well.  She has occasional nausea, but this is well controlled on her current medication. She has chronic pain, but this is well controlled on her current dose of narcotics. She has no neurologic complaints.  She denies any recent fevers or illnesses.  She has a good appetite and denies weight loss.  She has chronic shortness of breath, but denies any chest pain, cough, or hemoptysis. She has no abdominal pain.  She denies any vomiting, constipation, or diarrhea.  She denies any melena or hematochezia.  She has no urinary complaints.  Patient offers no further specific complaints today.  REVIEW OF SYSTEMS:   Review of Systems  Constitutional: Positive for malaise/fatigue. Negative for fever and weight loss.  Respiratory: Positive for shortness of breath. Negative for cough, hemoptysis and wheezing.   Cardiovascular: Negative.  Negative for chest pain and leg swelling.  Gastrointestinal: Positive for nausea. Negative for abdominal pain, blood in stool, diarrhea, melena and vomiting.  Genitourinary: Negative.  Negative for dysuria and frequency.  Musculoskeletal: Positive for back pain, joint pain and neck pain.  Skin: Negative.  Negative for rash.  Neurological: Positive for weakness. Negative for sensory change, focal weakness and headaches.  Psychiatric/Behavioral: Negative.  Negative for memory loss. The patient is not nervous/anxious.     As per HPI.  Otherwise, a complete review of systems is negative.  PAST MEDICAL HISTORY: Past Medical History:  Diagnosis Date  . Anxiety   . Arthritis   . Asthma    COPD  . CAP (community acquired pneumonia) 06/30/2014  . Cardiomyopathy ([place])    nonischemic (EF 35-40%)  . CHF (congestive heart failure) ([place])   . COPD (chronic obstructive pulmonary disease) ([place])   . Depression   . Dyspnea   . GERD (gastroesophageal reflux disease)   . Hyperlipidemia   . Hypertension   . Pneumonia   . PVC's (premature ventricular contractions)    states 10 years ago  . Sepsis ([place]) 06/30/2014  . Small cell lung cancer ([place]) 12/2016   Chemo tx's.  . SOB (shortness of breath) 05/08/2014  . Spinal cord injury at T7-T12 level [facility])    2016    PAST SURGICAL HISTORY: Past Surgical History:  Procedure Laterality Date  . APPENDECTOMY    . CARDIAC CATHETERIZATION    . CATARACT EXTRACTION     right eye   . CESAREAN SECTION    . COLONOSCOPY    . LAMINOTOMY    . LUMBAR LAMINECTOMY/DECOMPRESSION MICRODISCECTOMY Left 08/01/2016   Procedure: Left Lumbar Four-Five Laminectomy and Foraminotomy;  Surgeon: Earnie Larsson, MD;  Location: [facility];  Service: Neurosurgery;  Laterality: Left;  . PORTA CATH INSERTION N/A 02/03/2017   Procedure: PORTA CATH INSERTION;  Surgeon: Katha Cabal, MD;  Location: [facility];  Service: Cardiovascular;  Laterality: N/A;    FAMILY HISTORY: Family History  Problem Relation Age of Onset  . Heart attack Father 15       MI x 3     ADVANCED  DIRECTIVES (Y/N):  N  HEALTH MAINTENANCE: Social History   Tobacco Use  . Smoking status: Former Smoker    Packs/day: 0.50    Years: 50.00    Pack years: 25.00    Types: Cigarettes    Quit date: 01/27/2015    Years since quitting: 4.0  . Smokeless tobacco: Never Used  Substance Use Topics  . Alcohol use: No  . Drug use: No     Colonoscopy:  PAP:  Bone density:  Lipid panel:  Allergies  Allergen Reactions  . Fish  Allergy Anaphylaxis    Per pt after eating [facility] she had throat swelling and SOB. MSY  Per pt after eating [facility] she had throat swelling and SOB. MSY   . Fentanyl     Overly sedated and groggy, ineffective     Current Outpatient Medications  Medication Sig Dispense Refill  . ADVAIR DISKUS 250-50 MCG/DOSE AEPB Inhale 1 puff into the lungs 2 (two) times daily.    Marland Kitchen albuterol (PROAIR HFA) 108 (90 Base) MCG/ACT inhaler Inhale 2 puffs into the lungs every 6 (six) hours as needed for wheezing or shortness of breath.     Marland Kitchen alendronate (FOSAMAX) 70 MG tablet Take 70 mg by mouth every Thursday.     . ARIPiprazole (ABILIFY) 5 MG tablet Take 5 mg by mouth daily.    . benzonatate (TESSALON) 100 MG capsule Take 1 capsule (100 mg total) by mouth every 8 (eight) hours as needed for cough. 20 capsule 0  . dextromethorphan-guaiFENesin (MUCINEX DM) 30-600 MG 12hr tablet Take 1 tablet by mouth 2 (two) times daily as needed for cough.    . diphenoxylate-atropine (LOMOTIL) 2.5-0.025 MG tablet Take 1 tablet by mouth 4 (four) times daily as needed for diarrhea or loose stools. 30 tablet 0  . DULoxetine (CYMBALTA) 60 MG capsule Take 60 mg by mouth daily.    Marland Kitchen ipratropium-albuterol (DUONEB) 0.5-2.5 (3) MG/3ML SOLN Take 3 mLs by nebulization every 4 (four) hours as needed. 360 mL 1  . LORazepam (ATIVAN) 0.5 MG tablet Take 0.5 mg by mouth as needed.    . metoprolol succinate (TOPROL-XL) 100 MG 24 hr tablet Take 1 tablet by mouth 1 day or 1 dose.    . morphine (MS CONTIN) 30 MG 12 hr tablet Take 1 tablet (30 mg total) by mouth every 8 (eight) hours. 90 tablet 0  . pantoprazole (PROTONIX) 40 MG tablet Take 1 tablet by mouth daily.    . potassium chloride SA (K-DUR,KLOR-CON) 20 MEQ tablet Take 1 tablet (20 mEq total) by mouth daily. 30 tablet 2  . prochlorperazine (COMPAZINE) 10 MG tablet TAKE 1 TABLET (10 MG TOTAL) BY MOUTH EVERY 6 (SIX) HOURS AS NEEDED (NAUSEA OR VOMITING). 60 tablet 2  . promethazine  (PHENERGAN) 25 MG tablet Take 1 tablet (25 mg total) by mouth every 8 (eight) hours as needed for nausea or vomiting. 30 tablet 2  . sacubitril-valsartan (ENTRESTO) 24-26 MG Take 1 tablet by mouth 2 (two) times daily. 180 tablet 3  . tiZANidine (ZANAFLEX) 4 MG tablet Take 1 tablet (4 mg total) by mouth every 8 (eight) hours as needed for muscle spasms. 90 tablet 2  . varenicline (CHANTIX) 1 MG tablet Take 1 mg by mouth 2 (two) times daily.     No current facility-administered medications for this visit.   Facility-Administered Medications Ordered in Other Visits  Medication Dose Route Frequency Provider Last Rate Last Admin  . heparin lock flush 100 unit/mL  500 Units Intracatheter Once PRN Lloyd Huger, MD      . sodium chloride flush (NS) 0.9 % injection 10 mL  10 mL Intravenous PRN Lloyd Huger, MD   10 mL at 03/01/17 0841  . sodium chloride flush (NS) 0.9 % injection 10 mL  10 mL Intracatheter PRN Lloyd Huger, MD   10 mL at 01/28/19 1053    OBJECTIVE: Vitals:   01/28/19 0955  BP: 91/64  Pulse: 74  Resp: 16  Temp: (!) 97.4 F (36.3 C)  SpO2: 95%     Body mass index is 34.06 kg/m.    ECOG FS:1 - Symptomatic but completely ambulatory  General: Well-developed, well-nourished, no acute distress.  Sitting in a wheelchair. Eyes: Pink conjunctiva, anicteric sclera. HEENT: Normocephalic, moist mucous membranes. Lungs: No audible wheezing or coughing. Heart: Regular rate and rhythm. Abdomen: Soft, nontender, no obvious distention. Musculoskeletal: No edema, cyanosis, or clubbing. Neuro: Alert, answering all questions appropriately. Cranial nerves grossly intact. Skin: No rashes or petechiae noted. Psych: Normal affect.  LAB RESULTS:  Lab Results  Component Value Date   NA 138 01/28/2019   K 3.9 01/28/2019   CL 106 01/28/2019   CO2 24 01/28/2019   GLUCOSE 120 (H) 01/28/2019   BUN 17 01/28/2019   CREATININE 0.85 01/28/2019   CALCIUM 8.6 (L) 01/28/2019    PROT 7.9 01/28/2019   ALBUMIN 3.5 01/28/2019   AST 28 01/28/2019   ALT 30 01/28/2019   ALKPHOS 90 01/28/2019   BILITOT 0.4 01/28/2019   GFRNONAA >60 01/28/2019   GFRAA >60 01/28/2019    Lab Results  Component Value Date   WBC 6.3 01/28/2019   NEUTROABS 4.0 01/28/2019   HGB 9.9 (L) 01/28/2019   HCT 34.0 (L) 01/28/2019   MCV 95.0 01/28/2019   PLT 296 01/28/2019     STUDIES: No results found.  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  PLAN:  1. 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  Patient then received maintenance Tecentriq from Jun 21, 2017 through November 15, 2017.  CT scan results from December 26, 2018 reviewed independently with essentially stable disease.  Previously, hospice was discussed, patient wished to pursue aggressive treatment.  She was initially scheduled to get topotecan on days 1 through 5 every 21 days, but given her persistent thrombocytopenia she can only receive treatment every 28 days.  Proceed with cycle 15, day 1 of dose reduced topotecan today.  Return to clinic Tuesday through Friday for topotecan only.  Patient will then return to clinic in 4 weeks for further evaluation and consideration of cycle 16.  Plan to reimage in approximately February 2021.   2. Pain: Continue MS Contin to 30 mg every 8 hours. Patient states she has "fired" her pain doctor.   3. Thrombocytopenia: Resolved.  Proceed with treatment every 28 days as above. 4.  Anemia: Hemoglobin has trended down to 9.9, monitor. 5.  COPD: Chronic and unchanged.  Continue outpatient palliative care.  Continue current medications as prescribed. 6.  Hypokalemia: Resolved.  Continue oral potassium supplementation. 7.  Disposition: Hospice and end-of-life care were previously discussed, but patient wishes to continue  aggressive treatments.  She expressed understanding that her treatment options are limited.  She confirms that she is a DNR and is having outpatient palliative care visit. 8.  Tobacco use: Patient states she recently started smoking again.  She expressed understanding that this may increase her risk of recurrence.  She also recently started Chantix in order to quit again.   Patient expressed understanding and was in agreement with this plan. She also understands that She can call clinic at any time with any questions, concerns, or complaints.   Cancer Staging Small cell lung cancer [facility]) Staging form: Lung, AJCC 8th Edition - Clinical stage from 01/28/2017: Stage IV (cT4, cN3, pM1c) - Signed by Lloyd Huger, MD on 01/28/2017   Lloyd Huger, MD   01/28/2019 11:38 AM

## 2019-01-28 ENCOUNTER — Inpatient Hospital Stay (HOSPITAL_BASED_OUTPATIENT_CLINIC_OR_DEPARTMENT_OTHER): Payer: Medicare Other | Admitting: Oncology

## 2019-01-28 ENCOUNTER — Inpatient Hospital Stay: Payer: Medicare Other

## 2019-01-28 ENCOUNTER — Inpatient Hospital Stay: Payer: Medicare Other | Attending: Oncology

## 2019-01-28 ENCOUNTER — Other Ambulatory Visit: Payer: Self-pay

## 2019-01-28 ENCOUNTER — Encounter: Payer: Self-pay | Admitting: Oncology

## 2019-01-28 VITALS — BP 91/64 | HR 74 | Temp 97.4°F | Resp 16 | Wt 204.7 lb

## 2019-01-28 DIAGNOSIS — C787 Secondary malignant neoplasm of liver and intrahepatic bile duct: Secondary | ICD-10-CM | POA: Diagnosis present

## 2019-01-28 DIAGNOSIS — D649 Anemia, unspecified: Secondary | ICD-10-CM | POA: Diagnosis not present

## 2019-01-28 DIAGNOSIS — C3490 Malignant neoplasm of unspecified part of unspecified bronchus or lung: Secondary | ICD-10-CM

## 2019-01-28 DIAGNOSIS — D696 Thrombocytopenia, unspecified: Secondary | ICD-10-CM

## 2019-01-28 DIAGNOSIS — Z8249 Family history of ischemic heart disease and other diseases of the circulatory system: Secondary | ICD-10-CM | POA: Diagnosis not present

## 2019-01-28 DIAGNOSIS — C7951 Secondary malignant neoplasm of bone: Secondary | ICD-10-CM | POA: Insufficient documentation

## 2019-01-28 DIAGNOSIS — I110 Hypertensive heart disease with heart failure: Secondary | ICD-10-CM | POA: Diagnosis not present

## 2019-01-28 DIAGNOSIS — Z7951 Long term (current) use of inhaled steroids: Secondary | ICD-10-CM | POA: Insufficient documentation

## 2019-01-28 DIAGNOSIS — Z79899 Other long term (current) drug therapy: Secondary | ICD-10-CM | POA: Insufficient documentation

## 2019-01-28 DIAGNOSIS — F329 Major depressive disorder, single episode, unspecified: Secondary | ICD-10-CM | POA: Insufficient documentation

## 2019-01-28 DIAGNOSIS — I509 Heart failure, unspecified: Secondary | ICD-10-CM | POA: Diagnosis not present

## 2019-01-28 DIAGNOSIS — F17210 Nicotine dependence, cigarettes, uncomplicated: Secondary | ICD-10-CM | POA: Insufficient documentation

## 2019-01-28 DIAGNOSIS — Z5111 Encounter for antineoplastic chemotherapy: Secondary | ICD-10-CM | POA: Insufficient documentation

## 2019-01-28 DIAGNOSIS — F419 Anxiety disorder, unspecified: Secondary | ICD-10-CM | POA: Insufficient documentation

## 2019-01-28 DIAGNOSIS — E785 Hyperlipidemia, unspecified: Secondary | ICD-10-CM | POA: Diagnosis not present

## 2019-01-28 DIAGNOSIS — J449 Chronic obstructive pulmonary disease, unspecified: Secondary | ICD-10-CM | POA: Diagnosis not present

## 2019-01-28 LAB — CBC WITH DIFFERENTIAL/PLATELET
Abs Immature Granulocytes: 0.03 10*3/uL (ref 0.00–0.07)
Basophils Absolute: 0 10*3/uL (ref 0.0–0.1)
Basophils Relative: 1 %
Eosinophils Absolute: 0 10*3/uL (ref 0.0–0.5)
Eosinophils Relative: 0 %
HCT: 34 % — ABNORMAL LOW (ref 36.0–46.0)
Hemoglobin: 9.9 g/dL — ABNORMAL LOW (ref 12.0–15.0)
Immature Granulocytes: 1 %
Lymphocytes Relative: 26 %
Lymphs Abs: 1.6 10*3/uL (ref 0.7–4.0)
MCH: 27.7 pg (ref 26.0–34.0)
MCHC: 29.1 g/dL — ABNORMAL LOW (ref 30.0–36.0)
MCV: 95 fL (ref 80.0–100.0)
Monocytes Absolute: 0.7 10*3/uL (ref 0.1–1.0)
Monocytes Relative: 11 %
Neutro Abs: 4 10*3/uL (ref 1.7–7.7)
Neutrophils Relative %: 61 %
Platelets: 296 10*3/uL (ref 150–400)
RBC: 3.58 MIL/uL — ABNORMAL LOW (ref 3.87–5.11)
RDW: 21.2 % — ABNORMAL HIGH (ref 11.5–15.5)
WBC: 6.3 10*3/uL (ref 4.0–10.5)
nRBC: 0 % (ref 0.0–0.2)

## 2019-01-28 LAB — COMPREHENSIVE METABOLIC PANEL
ALT: 30 U/L (ref 0–44)
AST: 28 U/L (ref 15–41)
Albumin: 3.5 g/dL (ref 3.5–5.0)
Alkaline Phosphatase: 90 U/L (ref 38–126)
Anion gap: 8 (ref 5–15)
BUN: 17 mg/dL (ref 8–23)
CO2: 24 mmol/L (ref 22–32)
Calcium: 8.6 mg/dL — ABNORMAL LOW (ref 8.9–10.3)
Chloride: 106 mmol/L (ref 98–111)
Creatinine, Ser: 0.85 mg/dL (ref 0.44–1.00)
GFR calc Af Amer: >60 mL/min (ref >60)
GFR calc non Af Amer: >60 mL/min (ref >60)
Glucose, Bld: 120 mg/dL — ABNORMAL HIGH (ref 70–99)
Potassium: 3.9 mmol/L (ref 3.5–5.1)
Sodium: 138 mmol/L (ref 135–145)
Total Bilirubin: 0.4 mg/dL (ref 0.3–1.2)
Total Protein: 7.9 g/dL (ref 6.5–8.1)

## 2019-01-28 MED ORDER — SODIUM CHLORIDE 0.9% FLUSH
10.0000 mL | INTRAVENOUS | Status: DC | PRN
  Administered 2019-01-28: 10 mL
  Filled 2019-01-28: qty 10

## 2019-01-28 MED ORDER — PROCHLORPERAZINE MALEATE 10 MG PO TABS
10.0000 mg | ORAL_TABLET | Freq: Once | ORAL | Status: AC
  Administered 2019-01-28: 10 mg via ORAL
  Filled 2019-01-28: qty 1

## 2019-01-28 MED ORDER — TOPOTECAN HCL CHEMO INJECTION 4 MG
1.3000 mg/m2 | Freq: Once | INTRAVENOUS | Status: AC
  Administered 2019-01-28: 2.7 mg via INTRAVENOUS
  Filled 2019-01-28: qty 2.7

## 2019-01-28 MED ORDER — HEPARIN SOD (PORK) LOCK FLUSH 100 UNIT/ML IV SOLN
500.0000 [IU] | Freq: Once | INTRAVENOUS | Status: AC | PRN
  Administered 2019-01-28: 500 [IU]
  Filled 2019-01-28: qty 5

## 2019-01-28 MED ORDER — SODIUM CHLORIDE 0.9 % IV SOLN
Freq: Once | INTRAVENOUS | Status: AC
  Administered 2019-01-28: 11:00:00 via INTRAVENOUS
  Filled 2019-01-28: qty 250

## 2019-01-29 ENCOUNTER — Other Ambulatory Visit: Payer: Self-pay

## 2019-01-29 ENCOUNTER — Inpatient Hospital Stay: Payer: Medicare Other

## 2019-01-29 VITALS — BP 103/65 | HR 88 | Temp 96.3°F | Resp 20

## 2019-01-29 DIAGNOSIS — C3490 Malignant neoplasm of unspecified part of unspecified bronchus or lung: Secondary | ICD-10-CM

## 2019-01-29 DIAGNOSIS — Z5111 Encounter for antineoplastic chemotherapy: Secondary | ICD-10-CM | POA: Diagnosis not present

## 2019-01-29 MED ORDER — SODIUM CHLORIDE 0.9 % IV SOLN
Freq: Once | INTRAVENOUS | Status: AC
  Filled 2019-01-29: qty 250

## 2019-01-29 MED ORDER — HEPARIN SOD (PORK) LOCK FLUSH 100 UNIT/ML IV SOLN
500.0000 [IU] | Freq: Once | INTRAVENOUS | Status: AC | PRN
  Administered 2019-01-29: 500 [IU]
  Filled 2019-01-29: qty 5

## 2019-01-29 MED ORDER — PROCHLORPERAZINE MALEATE 10 MG PO TABS
10.0000 mg | ORAL_TABLET | Freq: Once | ORAL | Status: AC
  Administered 2019-01-29: 10:00:00 10 mg via ORAL
  Filled 2019-01-29: qty 1

## 2019-01-29 MED ORDER — TOPOTECAN HCL CHEMO INJECTION 4 MG
1.3000 mg/m2 | Freq: Once | INTRAVENOUS | Status: AC
  Administered 2019-01-29: 2.7 mg via INTRAVENOUS
  Filled 2019-01-29: qty 2.7

## 2019-01-29 MED ORDER — SODIUM CHLORIDE 0.9% FLUSH
10.0000 mL | INTRAVENOUS | Status: DC | PRN
  Administered 2019-01-29: 10 mL
  Filled 2019-01-29: qty 10

## 2019-01-30 ENCOUNTER — Inpatient Hospital Stay: Payer: Medicare Other

## 2019-01-30 ENCOUNTER — Other Ambulatory Visit: Payer: Self-pay

## 2019-01-30 VITALS — BP 113/72 | HR 80 | Temp 96.1°F | Resp 20

## 2019-01-30 DIAGNOSIS — Z5111 Encounter for antineoplastic chemotherapy: Secondary | ICD-10-CM | POA: Diagnosis not present

## 2019-01-30 DIAGNOSIS — C3490 Malignant neoplasm of unspecified part of unspecified bronchus or lung: Secondary | ICD-10-CM

## 2019-01-30 MED ORDER — SODIUM CHLORIDE 0.9 % IV SOLN
Freq: Once | INTRAVENOUS | Status: AC
  Filled 2019-01-30: qty 250

## 2019-01-30 MED ORDER — HEPARIN SOD (PORK) LOCK FLUSH 100 UNIT/ML IV SOLN
INTRAVENOUS | Status: AC
  Filled 2019-01-30: qty 5

## 2019-01-30 MED ORDER — PROCHLORPERAZINE MALEATE 10 MG PO TABS
10.0000 mg | ORAL_TABLET | Freq: Once | ORAL | Status: AC
  Administered 2019-01-30: 10 mg via ORAL
  Filled 2019-01-30: qty 1

## 2019-01-30 MED ORDER — TOPOTECAN HCL CHEMO INJECTION 4 MG
1.3000 mg/m2 | Freq: Once | INTRAVENOUS | Status: AC
  Administered 2019-01-30: 2.7 mg via INTRAVENOUS
  Filled 2019-01-30: qty 2.7

## 2019-01-30 MED ORDER — HEPARIN SOD (PORK) LOCK FLUSH 100 UNIT/ML IV SOLN
500.0000 [IU] | Freq: Once | INTRAVENOUS | Status: AC | PRN
  Administered 2019-01-30: 500 [IU]
  Filled 2019-01-30: qty 5

## 2019-01-31 ENCOUNTER — Other Ambulatory Visit: Payer: Self-pay

## 2019-01-31 ENCOUNTER — Inpatient Hospital Stay: Payer: Medicare Other

## 2019-01-31 VITALS — BP 110/75 | HR 93 | Temp 97.0°F | Resp 19

## 2019-01-31 DIAGNOSIS — C3490 Malignant neoplasm of unspecified part of unspecified bronchus or lung: Secondary | ICD-10-CM

## 2019-01-31 DIAGNOSIS — Z5111 Encounter for antineoplastic chemotherapy: Secondary | ICD-10-CM | POA: Diagnosis not present

## 2019-01-31 MED ORDER — TOPOTECAN HCL CHEMO INJECTION 4 MG
1.3000 mg/m2 | Freq: Once | INTRAVENOUS | Status: AC
  Administered 2019-01-31: 2.7 mg via INTRAVENOUS
  Filled 2019-01-31: qty 2.7

## 2019-01-31 MED ORDER — HEPARIN SOD (PORK) LOCK FLUSH 100 UNIT/ML IV SOLN
500.0000 [IU] | Freq: Once | INTRAVENOUS | Status: AC | PRN
  Administered 2019-01-31: 500 [IU]
  Filled 2019-01-31: qty 5

## 2019-01-31 MED ORDER — HEPARIN SOD (PORK) LOCK FLUSH 100 UNIT/ML IV SOLN
INTRAVENOUS | Status: AC
  Filled 2019-01-31: qty 5

## 2019-01-31 MED ORDER — PROCHLORPERAZINE MALEATE 10 MG PO TABS
10.0000 mg | ORAL_TABLET | Freq: Once | ORAL | Status: AC
  Administered 2019-01-31: 10 mg via ORAL
  Filled 2019-01-31: qty 1

## 2019-01-31 MED ORDER — SODIUM CHLORIDE 0.9 % IV SOLN
Freq: Once | INTRAVENOUS | Status: AC
  Filled 2019-01-31: qty 250

## 2019-02-01 ENCOUNTER — Inpatient Hospital Stay: Payer: Medicare Other

## 2019-02-01 ENCOUNTER — Other Ambulatory Visit: Payer: Self-pay

## 2019-02-01 ENCOUNTER — Telehealth: Payer: Self-pay | Admitting: Adult Health Nurse Practitioner

## 2019-02-01 VITALS — BP 85/60 | HR 72 | Temp 97.5°F | Resp 18

## 2019-02-01 DIAGNOSIS — C3490 Malignant neoplasm of unspecified part of unspecified bronchus or lung: Secondary | ICD-10-CM

## 2019-02-01 DIAGNOSIS — Z5111 Encounter for antineoplastic chemotherapy: Secondary | ICD-10-CM | POA: Diagnosis not present

## 2019-02-01 MED ORDER — TOPOTECAN HCL CHEMO INJECTION 4 MG
1.3000 mg/m2 | Freq: Once | INTRAVENOUS | Status: AC
  Administered 2019-02-01: 2.7 mg via INTRAVENOUS
  Filled 2019-02-01: qty 2.7

## 2019-02-01 MED ORDER — HEPARIN SOD (PORK) LOCK FLUSH 100 UNIT/ML IV SOLN
INTRAVENOUS | Status: AC
  Filled 2019-02-01: qty 5

## 2019-02-01 MED ORDER — HEPARIN SOD (PORK) LOCK FLUSH 100 UNIT/ML IV SOLN
500.0000 [IU] | Freq: Once | INTRAVENOUS | Status: AC | PRN
  Administered 2019-02-01: 500 [IU]
  Filled 2019-02-01: qty 5

## 2019-02-01 MED ORDER — PROCHLORPERAZINE MALEATE 10 MG PO TABS
10.0000 mg | ORAL_TABLET | Freq: Once | ORAL | Status: AC
  Administered 2019-02-01: 10 mg via ORAL
  Filled 2019-02-01: qty 1

## 2019-02-01 MED ORDER — SODIUM CHLORIDE 0.9 % IV SOLN
Freq: Once | INTRAVENOUS | Status: AC
  Filled 2019-02-01: qty 250

## 2019-02-01 NOTE — Telephone Encounter (Signed)
Called patient to schedule visit.  She was at chemo and asked if I could call back later.  Will call back later this afternoon. Grace [name] K. Olena Heckle NP

## 2019-02-04 ENCOUNTER — Telehealth: Payer: Self-pay | Admitting: Adult Health Nurse Practitioner

## 2019-02-04 NOTE — Telephone Encounter (Signed)
Called to schedule appointment.  VM box was full and was unable to leave message. Eternity Dexter K. Olena Heckle NP

## 2019-02-20 ENCOUNTER — Other Ambulatory Visit: Payer: Self-pay | Admitting: *Deleted

## 2019-02-20 MED ORDER — MORPHINE SULFATE ER 30 MG PO TBCR: 30.0000 mg | EXTENDED_RELEASE_TABLET | Freq: Three times a day (TID) | ORAL | 0 refills | Status: DC

## 2019-02-20 NOTE — Telephone Encounter (Signed)
Per your last note, patient was seen by 'pain doctor'. Are you ok with continuing to refill pain medications for her? Would you like her to be followed by Josh? She sees NP with [facility] but it doesn't appear that they are managing pain medications for her. Thanks! Ander Purpura

## 2019-02-22 ENCOUNTER — Encounter: Payer: Self-pay | Admitting: Oncology

## 2019-02-23 NOTE — Progress Notes (Signed)
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  INTERVAL HISTORY: Patient returns to clinic today for further evaluation and consideration of cycle 16 of monthly topotecan.  She currently feels well and is asymptomatic.  She has chronic weakness and fatigue.  Her pain and nausea are well controlled on her current medications. She has no neurologic complaints.  She denies any recent fevers or illnesses.  She has a good appetite and denies weight loss.  She has chronic shortness of breath, but denies any chest pain, cough, or hemoptysis. She has no abdominal pain.  She denies any vomiting, constipation, or diarrhea.  She denies any melena or hematochezia.  She has no urinary complaints.  Patient offers no specific complaints today.  REVIEW OF SYSTEMS:   Review of Systems  Constitutional: Positive for malaise/fatigue. Negative for fever and weight loss.  Respiratory: Negative.  Negative for cough, hemoptysis, shortness of breath and wheezing.   Cardiovascular: Negative.  Negative for chest pain and leg swelling.  Gastrointestinal: Negative for abdominal pain, blood in stool, diarrhea, melena, nausea and vomiting.  Genitourinary: Negative.  Negative for dysuria and frequency.  Musculoskeletal: Positive for back pain, joint pain and neck pain.  Skin: Negative.  Negative for rash.  Neurological: Positive for weakness. Negative for sensory change, focal weakness and headaches.  Psychiatric/Behavioral: Negative.  Negative for memory loss. The patient is not nervous/anxious.     As per HPI. Otherwise, a complete review of systems is negative.  PAST MEDICAL HISTORY: Past Medical History:  Diagnosis Date    . Anxiety   . Arthritis   . Asthma    COPD  . CAP (community acquired pneumonia) 06/30/2014  . Cardiomyopathy ([place])    nonischemic (EF 35-40%)  . CHF (congestive heart failure) ([place])   . COPD (chronic obstructive pulmonary disease) ([place])   . Depression   . Dyspnea   . GERD (gastroesophageal reflux disease)   . Hyperlipidemia   . Hypertension   . Pneumonia   . PVC's (premature ventricular contractions)    states 10 years ago  . Sepsis ([place]) 06/30/2014  . Small cell lung cancer ([place]) 12/2016   Chemo tx's.  . SOB (shortness of breath) 05/08/2014  . Spinal cord injury at T7-T12 level [facility])    2016    PAST SURGICAL HISTORY: Past Surgical History:  Procedure Laterality Date  . APPENDECTOMY    . CARDIAC CATHETERIZATION    . CATARACT EXTRACTION     right eye   . CESAREAN SECTION    . COLONOSCOPY    . LAMINOTOMY    . LUMBAR LAMINECTOMY/DECOMPRESSION MICRODISCECTOMY Left 08/01/2016   Procedure: Left Lumbar Four-Five Laminectomy and Foraminotomy;  Surgeon: Earnie Larsson, MD;  Location: [place];  Service: Neurosurgery;  Laterality: Left;  . PORTA CATH INSERTION N/A 02/03/2017   Procedure: PORTA CATH INSERTION;  Surgeon: Katha Cabal, MD;  Location: [place] [facility];  Service: Cardiovascular;  Laterality: N/A;    FAMILY HISTORY: Family History  Problem Relation Age of Onset  . Heart attack Father 68       MI x 3     ADVANCED DIRECTIVES (Y/N):  N  HEALTH MAINTENANCE: Social History   Tobacco Use  . Smoking status:  Former Smoker    Packs/day: 0.50    Years: 50.00    Pack years: 25.00    Types: Cigarettes    Quit date: 01/27/2015    Years since quitting: 4.0  . Smokeless tobacco: Never Used  Substance Use Topics  . Alcohol use: No  . Drug use: No     Colonoscopy:  PAP:  Bone density:  Lipid panel:  Allergies  Allergen Reactions  . Fish Allergy Anaphylaxis    Per pt after eating [employer] she had throat swelling and SOB. MSY  Per pt after eating [facility] she had throat swelling and SOB. MSY   . Fentanyl     Overly sedated and groggy, ineffective     Current Outpatient Medications  Medication Sig Dispense Refill  . ADVAIR DISKUS 250-50 MCG/DOSE AEPB Inhale 1 puff into the lungs 2 (two) times daily.    Marland Kitchen albuterol (PROAIR HFA) 108 (90 Base) MCG/ACT inhaler Inhale 2 puffs into the lungs every 6 (six) hours as needed for wheezing or shortness of breath.     Marland Kitchen alendronate (FOSAMAX) 70 MG tablet Take 70 mg by mouth every Thursday.     . ARIPiprazole (ABILIFY) 5 MG tablet Take 5 mg by mouth daily.    . benzonatate (TESSALON) 100 MG capsule Take 1 capsule (100 mg total) by mouth every 8 (eight) hours as needed for cough. 20 capsule 0  . dextromethorphan-guaiFENesin (MUCINEX DM) 30-600 MG 12hr tablet Take 1 tablet by mouth 2 (two) times daily as needed for cough.    . diphenoxylate-atropine (LOMOTIL) 2.5-0.025 MG tablet Take 1 tablet by mouth 4 (four) times daily as needed for diarrhea or loose stools. 30 tablet 0  . DULoxetine (CYMBALTA) 60 MG capsule Take 60 mg by mouth daily.    Marland Kitchen ipratropium-albuterol (DUONEB) 0.5-2.5 (3) MG/3ML SOLN Take 3 mLs by nebulization every 4 (four) hours as needed. 360 mL 1  . LORazepam (ATIVAN) 0.5 MG tablet Take 0.5 mg by mouth as needed.    . metoprolol succinate (TOPROL-XL) 100 MG 24 hr tablet Take 1 tablet by mouth 1 day or 1 dose.    . morphine (MS CONTIN) 30 MG 12 hr tablet Take 1 tablet (30 mg total) by mouth every 8 (eight) hours. 90 tablet 0  . pantoprazole (PROTONIX) 40 MG tablet Take 1 tablet by mouth daily.    . potassium chloride SA (K-DUR,KLOR-CON) 20 MEQ tablet Take 1 tablet (20 mEq total) by mouth daily. 30 tablet 2  . prochlorperazine (COMPAZINE) 10 MG tablet TAKE 1 TABLET (10 MG TOTAL) BY MOUTH EVERY 6 (SIX) HOURS AS NEEDED (NAUSEA OR VOMITING). 60 tablet 2  . promethazine (PHENERGAN) 25 MG tablet Take 1 tablet (25 mg total) by mouth every 8 (eight) hours as needed for nausea or vomiting. 30 tablet  2  . sacubitril-valsartan (ENTRESTO) 24-26 MG Take 1 tablet by mouth 2 (two) times daily. 180 tablet 3  . tiZANidine (ZANAFLEX) 4 MG tablet Take 1 tablet (4 mg total) by mouth every 8 (eight) hours as needed for muscle spasms. 90 tablet 2  . varenicline (CHANTIX) 1 MG tablet Take 1 mg by mouth 2 (two) times daily.     No current facility-administered medications for this visit.   Facility-Administered Medications Ordered in Other Visits  Medication Dose Route Frequency Provider Last Rate Last Admin  . heparin lock flush 100 unit/mL  500 Units Intravenous Once Lloyd Huger, MD      . sodium chloride flush (  NS) 0.9 % injection 10 mL  10 mL Intravenous PRN Lloyd Huger, MD   10 mL at 03/01/17 0841    OBJECTIVE: Vitals:   02/25/19 0929  BP: 98/69  Pulse: 84  Temp: (!) 95.9 F (35.5 C)     Body mass index is 33.78 kg/m.    ECOG FS:1 - Symptomatic but completely ambulatory  General: Well-developed, well-nourished, no acute distress.  Sitting in a wheelchair. Eyes: Pink conjunctiva, anicteric sclera. HEENT: Normocephalic, moist mucous membranes. Lungs: No audible wheezing or coughing. Heart: Regular rate and rhythm. Abdomen: Soft, nontender, no obvious distention. Musculoskeletal: No edema, cyanosis, or clubbing. Neuro: Alert, answering all questions appropriately. Cranial nerves grossly intact. Skin: No rashes or petechiae noted. Psych: Normal affect.  LAB RESULTS:  Lab Results  Component Value Date   NA 135 02/25/2019   K 3.4 (L) 02/25/2019   CL 102 02/25/2019   CO2 24 02/25/2019   GLUCOSE 126 (H) 02/25/2019   BUN 17 02/25/2019   CREATININE 0.72 02/25/2019   CALCIUM 8.4 (L) 02/25/2019   PROT 7.8 02/25/2019   ALBUMIN 3.4 (L) 02/25/2019   AST 23 02/25/2019   ALT 25 02/25/2019   ALKPHOS 96 02/25/2019   BILITOT 0.5 02/25/2019   GFRNONAA >60 02/25/2019   GFRAA >60 02/25/2019    Lab Results  Component Value Date   WBC 5.5 02/25/2019   NEUTROABS 3.4  02/25/2019   HGB 9.6 (L) 02/25/2019   HCT 32.1 (L) 02/25/2019   MCV 94.4 02/25/2019   PLT 294 02/25/2019     STUDIES: No results found.  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  PLAN:  1. 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  Patient then received maintenance Tecentriq from Jun 21, 2017 through November 15, 2017.  CT scan results from December 26, 2018 reviewed independently with essentially stable disease.  Previously, hospice was discussed, patient wished to pursue aggressive treatment.  She was initially scheduled to get topotecan on days 1 through 5 every 21 days, but given her persistent thrombocytopenia she can only receive treatment every 28 days.  Proceed with cycle 16, day 1 of dose reduced topotecan today.  Return to clinic Tuesday through Friday for topotecan only.  Patient will then return to clinic in 4 weeks for further evaluation and consideration of cycle 17.  Plan to reimage prior to cycle 17.   2. Pain: Chronic and unchanged.  Continue MS Contin to 30 mg every 8 hours. Patient states she has "fired" her pain doctor.   3. Thrombocytopenia: Resolved.  Proceed with treatment every 28 days as above. 4.  Anemia: Hemoglobin slowly trending down is now 9.6.  Monitor. 5.  COPD: Chronic and unchanged.  Continue outpatient palliative care.  Continue current medications as prescribed. 6.  Hypokalemia: Mild.  Continue oral potassium supplementation. 7.  Disposition: Hospice and end-of-life care were previously discussed, but patient wishes to continue aggressive treatments.  She expressed understanding that her treatment options are limited.  She confirms that she is a DNR and is having outpatient palliative care visit. 8.  Tobacco use: Patient states she recently started smoking again.  She expressed  understanding that this may increase her risk of recurrence.  She also recently started Chantix in order to quit again.   Patient expressed understanding and was in agreement with  this plan. She also understands that She can call clinic at any time with any questions, concerns, or complaints.   Cancer Staging Small cell lung cancer [facility]) Staging form: Lung, AJCC 8th Edition - Clinical stage from 01/28/2017: Stage IV (cT4, cN3, pM1c) - Signed by Lloyd Huger, MD on 01/28/2017   Lloyd Huger, MD   02/25/2019 9:57 AM

## 2019-02-24 NOTE — Progress Notes (Signed)
Patient: Grace [name]  Service Category: E/M  Provider: Gaspar Cola, MD  DOB: Aug 20, 1950  DOS: 02/25/2019  Referring Provider: [name], MD  MRN: [id number]  Setting: Ambulatory outpatient  PCP: [name], MD  Type: Established Patient  Specialty: Interventional Pain Management    Location: External  Delivery: TeleHealth     Virtual Encounter - Pain Management PROVIDER NOTE: Information contained herein reflects review and annotations entered in association with encounter. Interpretation of such information and data should be left to medically-trained personnel. Information provided to patient can be located elsewhere in the medical record under "Patient Instructions". Document created using STT-dictation technology, any transcriptional errors that may result from process are unintentional.    Contact & Pharmacy Preferred: [phone] Home: [phone] (home) Mobile: [phone] (mobile) E-mail: [email or web address]  [id number] [address], [place] [address] Phone: [phone] Fax: [phone]   Pre-screening  Grace [name] offered "in-person" vs "virtual" encounter. She indicated preferring virtual for this encounter.   Reason COVID-19*  Social distancing based on CDC and AMA recommendations.   I contacted Grace [name] on 02/25/2019 via telephone.      I clearly identified myself as Gaspar Cola, MD. I verified that I was speaking with the correct person using two identifiers (Name: Grace [name], and date of birth: 02/28/1950).  Consent I sought verbal advanced consent from Grace [name] for virtual visit interactions. I informed Ms. Barua of possible security and privacy concerns, risks, and limitations associated with providing "not-in-person" medical evaluation and management services. I also informed Ms. Bloxham of the availability of "in-person" appointments. Finally, I informed her that there would be  a charge for the virtual visit and that she could be  personally, fully or partially, financially responsible for it. Ms. Fennimore expressed understanding and agreed to proceed.   Historic Elements   Ms. TASNIM [name] is a 69 y.o. year old, female patient evaluated today after her last encounter by our practice on Visit date not found. 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 She also  has a past surgical history that includes Cardiac catheterization; Cesarean section; Appendectomy; Cataract extraction; Colonoscopy; Lumbar laminectomy/decompression microdiscectomy (Left, 08/01/2016); Laminotomy; and PORTA CATH INSERTION (N/A, 02/03/2017). Grace [name] has a current medication list which includes the following prescription(s): advair diskus, albuterol, alendronate, aripiprazole, dextromethorphan-guaifenesin, diphenoxylate-atropine, duloxetine, ipratropium-albuterol, lorazepam, metoprolol succinate, morphine, pantoprazole, potassium chloride sa, prochlorperazine, promethazine, sacubitril-valsartan, varenicline, benzonatate, and tizanidine, and the following Facility-Administered Medications: heparin lock flush and sodium chloride flush. She  reports that she quit smoking about 4 years ago. Her smoking use included cigarettes. She has a 25.00 pack-year smoking history. She has never used smokeless tobacco. She reports that she does not drink alcohol or use drugs. Grace [name] is allergic to fish allergy and fentanyl.   HPI  Today, she is  being contacted for worsening of previously known (established) problem.  The patient indicates having recurrence of her left knee and left shoulder pain.  We have treated both of them for with intra-articular steroid injections.  The patient indicates that they worked very well in controlling her  pain.  The last time that we did the left shoulder injection was on 11/28/2017.  The last time that we did left knee injection was on 11/07/2017.  She would like to come in to have both of them done on the same day, on the left side.  She has requested to have it done without sedation.  Pharmacotherapy Assessment  Analgesic: No opioid analgesics prescribed by our practice.   Monitoring: Pharmacotherapy: No side-effects or adverse reactions reported. [place] PMP: PDMP reviewed during this encounter.       Compliance: No problems identified. Effectiveness: Clinically acceptable. Plan: Refer to "POC".  UDS: No results found for: SUMMARY Laboratory Chemistry Profile (12 mo)  Renal: 02/25/2019: BUN 17; Creatinine, Ser 0.72  Lab Results  Component Value Date   GFRAA >60 02/25/2019   GFRNONAA >60 02/25/2019   Hepatic: 02/25/2019: Albumin 3.4 Lab Results  Component Value Date   AST 23 02/25/2019   ALT 25 02/25/2019   Other: No results found for requested labs within last 8760 hours. Note: Above Lab results reviewed.  Imaging  CT Abdomen Pelvis W Contrast CLINICAL DATA:  Metastatic lung cancer restaging  EXAM: CT CHEST, ABDOMEN, AND PELVIS WITH CONTRAST  TECHNIQUE: Multidetector CT imaging of the chest, abdomen and pelvis was performed following the standard protocol during bolus administration of intravenous contrast.  CONTRAST:  138mL OMNIPAQUE IOHEXOL 300 MG/ML SOLN, additional oral enteric contrast  COMPARISON:  10/04/2018, 06/15/2018  FINDINGS: CT CHEST FINDINGS  Cardiovascular: Right chest port catheter. Aortic atherosclerosis. Normal heart size. Three-vessel coronary artery  calcifications. No pericardial effusion.  Mediastinum/Nodes: No enlarged mediastinal, hilar, or axillary lymph nodes. Thyroid gland, trachea, and esophagus demonstrate no significant findings.  Lungs/Pleura: Mild centrilobular emphysema. Stable appearance of a lobulated mass of the suprahilar left upper lobe measuring 2.7 x 2.4 cm (series 3, image 43). Unchanged small pulmonary nodules, the largest an irregular nodule of the right lower lobe measuring 1.0 x 0.6 cm (series 3, image 71). No pleural effusion or pneumothorax.  Musculoskeletal: No chest wall mass.  CT ABDOMEN PELVIS FINDINGS  Hepatobiliary: Hepatic steatosis. No change in retracted, post treatment appearance of multiple hepatic metastatic lesions, of the anterior left lobe (series 2, image 58) and right liver dome (series 2, image 47). Index lesion of the left lobe measures 1.6 x 1.5 cm (series 2, image 58). No gallstones or gallbladder wall thickening. Unchanged dilatation of the common bile duct without obstructing lesion appreciated.  Pancreas: Unremarkable. No pancreatic ductal dilatation or surrounding inflammatory changes.  Spleen: Normal in size without significant abnormality.  Adrenals/Urinary Tract: Adrenal glands are unremarkable. Kidneys are normal, without renal calculi, solid lesion, or hydronephrosis. Bladder is unremarkable.  Stomach/Bowel: Stomach is within normal limits. No evidence of bowel wall thickening, distention, or inflammatory changes.  Vascular/Lymphatic: Severe aortic atherosclerosis with irregular mural thrombus. No enlarged abdominal or pelvic lymph nodes.  Reproductive: No mass or other abnormality.  Other: No abdominal wall hernia or abnormality. No abdominopelvic ascites.  Musculoskeletal: No change in multiple sclerotic osseous metastatic lesions of the vertebral bodies and bilateral ilia. High-grade vertebral plana deformity of T5.  IMPRESSION: 1. Stable appearance of a  lobulated mass of the suprahilar left upper lobe measuring 2.7 x 2.4 cm (series 3, image 43).  2. Unchanged small pulmonary nodules, the largest an irregular nodule of the right lower lobe measuring 1.0 x 0.6 cm (series 3, image 71). Attention on follow-up.  3. No change in retracted, post treatment appearance of multiple hepatic metastatic lesions,  of the anterior left lobe (series 2, image 58) and right liver dome (series 2, image 47). Index lesion of the left lobe measures 1.6 x 1.5 cm (series 2, image 58).  4. No change in multiple sclerotic osseous metastatic lesions of the vertebral bodies and bilateral ilia.  5. Unchanged biliary ductal dilatation without distally obstructing lesion appreciated.  6.  Emphysema (ICD10-J43.9).  7.  Coronary artery disease. Aortic Atherosclerosis (ICD10-I70.0).  Electronically Signed   By: Eddie Candle M.D.   On: 12/26/2018 13:01 CT CHEST W CONTRAST CLINICAL DATA:  Metastatic lung cancer restaging  EXAM: CT CHEST, ABDOMEN, AND PELVIS WITH CONTRAST  TECHNIQUE: Multidetector CT imaging of the chest, abdomen and pelvis was performed following the standard protocol during bolus administration of intravenous contrast.  CONTRAST:  161mL OMNIPAQUE IOHEXOL 300 MG/ML SOLN, additional oral enteric contrast  COMPARISON:  10/04/2018, 06/15/2018  FINDINGS: CT CHEST FINDINGS  Cardiovascular: Right chest port catheter. Aortic atherosclerosis. Normal heart size. Three-vessel coronary artery calcifications. No pericardial effusion.  Mediastinum/Nodes: No enlarged mediastinal, hilar, or axillary lymph nodes. Thyroid gland, trachea, and esophagus demonstrate no significant findings.  Lungs/Pleura: Mild centrilobular emphysema. Stable appearance of a lobulated mass of the suprahilar left upper lobe measuring 2.7 x 2.4 cm (series 3, image 43). Unchanged small pulmonary nodules, the largest an irregular nodule of the right lower lobe measuring  1.0 x 0.6 cm (series 3, image 71). No pleural effusion or pneumothorax.  Musculoskeletal: No chest wall mass.  CT ABDOMEN PELVIS FINDINGS  Hepatobiliary: Hepatic steatosis. No change in retracted, post treatment appearance of multiple hepatic metastatic lesions, of the anterior left lobe (series 2, image 58) and right liver dome (series 2, image 47). Index lesion of the left lobe measures 1.6 x 1.5 cm (series 2, image 58). No gallstones or gallbladder wall thickening. Unchanged dilatation of the common bile duct without obstructing lesion appreciated.  Pancreas: Unremarkable. No pancreatic ductal dilatation or surrounding inflammatory changes.  Spleen: Normal in size without significant abnormality.  Adrenals/Urinary Tract: Adrenal glands are unremarkable. Kidneys are normal, without renal calculi, solid lesion, or hydronephrosis. Bladder is unremarkable.  Stomach/Bowel: Stomach is within normal limits. No evidence of bowel wall thickening, distention, or inflammatory changes.  Vascular/Lymphatic: Severe aortic atherosclerosis with irregular mural thrombus. No enlarged abdominal or pelvic lymph nodes.  Reproductive: No mass or other abnormality.  Other: No abdominal wall hernia or abnormality. No abdominopelvic ascites.  Musculoskeletal: No change in multiple sclerotic osseous metastatic lesions of the vertebral bodies and bilateral ilia. High-grade vertebral plana deformity of T5.  IMPRESSION: 1. Stable appearance of a lobulated mass of the suprahilar left upper lobe measuring 2.7 x 2.4 cm (series 3, image 43).  2. Unchanged small pulmonary nodules, the largest an irregular nodule of the right lower lobe measuring 1.0 x 0.6 cm (series 3, image 71). Attention on follow-up.  3. No change in retracted, post treatment appearance of multiple hepatic metastatic lesions, of the anterior left lobe (series 2, image 58) and right liver dome (series 2, image 47). Index lesion  of the left lobe measures 1.6 x 1.5 cm (series 2, image 58).  4. No change in multiple sclerotic osseous metastatic lesions of the vertebral bodies and bilateral ilia.  5. Unchanged biliary ductal dilatation without distally obstructing lesion appreciated.  6.  Emphysema (ICD10-J43.9).  7.  Coronary artery disease. Aortic Atherosclerosis (ICD10-I70.0).  Electronically Signed   By: Eddie Candle M.D.   On: 12/26/2018 13:01   Assessment  The primary encounter diagnosis  was Chronic shoulder pain (Left). Diagnoses of Osteoarthritis shoulder (Left), Osteoarthritis glenohumeral joint (Left), Chronic acromioclavicular joint pain (Left), Osteoarthritis of acromioclavicular joint (Left), Chronic knee pain (Left), and Osteoarthritis of knee (Left) were also pertinent to this visit.  Plan of Care  Problem-specific:  No problem-specific Assessment & Plan notes found for this encounter.  I am having Grace [name] maintain her DULoxetine, ARIPiprazole, alendronate, albuterol, ipratropium-albuterol, benzonatate, Advair Diskus, diphenoxylate-atropine, LORazepam, pantoprazole, potassium chloride SA, promethazine, sacubitril-valsartan, tiZANidine, dextromethorphan-guaiFENesin, metoprolol succinate, prochlorperazine, varenicline, and morphine.  Pharmacotherapy (Medications Ordered): No orders of the defined types were placed in this encounter.  Orders:  Orders Placed This Encounter  Procedures  . Shoulder injection (Schedule)    Standing Status:   Future    Standing Expiration Date:   03/27/2019    Scheduling Instructions:     Procedure: Intra-articular glenohumeral joint and acromioclavicular joint injection     Side: Left-sided     Sedation: No Sedation.     Timeframe: As soon as schedule allows    Order Specific Question:   Where will this procedure be performed?    Answer:   [facility]    Comments:   by Dr. Dossie Arbour  . Steroid Knee Blk (Schedule)    Local Anesthetic & Steroid  injection.    Standing Status:   Future    Standing Expiration Date:   03/27/2019    Scheduling Instructions:     Side: Left-sided     Sedation: None     Timeframe: As soon as schedule allows    Order Specific Question:   Where will this procedure be performed?    Answer:   [facility]   Follow-up plan:   Return for Procedure (no sedation): (L) Shoulder #2 & (L) Knee Steroid injection #3.      Interventional management options: Planned, scheduled, and/or pending:      Considering:   Therapeutic bilateral IA knee joint injections w/ Hyalgan Diagnostic bilateral Genicular NB Possible bilateral Genicular nerve RFA Diagnostic left suprascapular NB Possible bilateral suprascapular nerve RFA   Palliative PRN treatment(s):   Palliative right medial epicondyle elbow injection #2  Palliative right IA knee injection #3  Palliative left IA knee injection #3  Palliative right suprascapular NB #2  Palliative right IA shoulder (glenohumeral) #2  Palliative right acromioclavicular joint injection #2  Palliative left IA shoulder (glenohumeral) #2  Palliative left acromioclavicular joint injection #2     Recent Visits No visits were found meeting these conditions.  Showing recent visits within past 90 days and meeting all other requirements   Today's Visits Date Type Provider Dept  02/25/19 Telemedicine Milinda Pointer, MD [facility]  Showing today's visits and meeting all other requirements   Future Appointments No visits were found meeting these conditions.  Showing future appointments within next 90 days and meeting all other requirements   I discussed the assessment and treatment plan with the patient. The patient was provided an opportunity to ask questions and all were answered. The patient agreed with the plan and demonstrated an understanding of the instructions.  Patient advised to call back or seek an in-person evaluation if the symptoms or  condition worsens.  Total duration of non-face-to-face encounter: 15 minutes.  Note by: Gaspar Cola, MD Date: 02/25/2019; Time: 12:29 PM

## 2019-02-25 ENCOUNTER — Other Ambulatory Visit: Payer: Self-pay

## 2019-02-25 ENCOUNTER — Ambulatory Visit: Payer: Medicare Other | Attending: Pain Medicine | Admitting: Pain Medicine

## 2019-02-25 ENCOUNTER — Inpatient Hospital Stay: Payer: Medicare Other | Attending: Oncology

## 2019-02-25 ENCOUNTER — Encounter: Payer: Self-pay | Admitting: Pain Medicine

## 2019-02-25 ENCOUNTER — Inpatient Hospital Stay: Payer: Medicare Other

## 2019-02-25 ENCOUNTER — Inpatient Hospital Stay (HOSPITAL_BASED_OUTPATIENT_CLINIC_OR_DEPARTMENT_OTHER): Payer: Medicare Other | Admitting: Oncology

## 2019-02-25 ENCOUNTER — Telehealth: Payer: Self-pay | Admitting: *Deleted

## 2019-02-25 VITALS — BP 98/69 | HR 84 | Temp 95.9°F | Wt 203.0 lb

## 2019-02-25 DIAGNOSIS — M1712 Unilateral primary osteoarthritis, left knee: Secondary | ICD-10-CM

## 2019-02-25 DIAGNOSIS — M25562 Pain in left knee: Secondary | ICD-10-CM

## 2019-02-25 DIAGNOSIS — C787 Secondary malignant neoplasm of liver and intrahepatic bile duct: Secondary | ICD-10-CM | POA: Insufficient documentation

## 2019-02-25 DIAGNOSIS — J449 Chronic obstructive pulmonary disease, unspecified: Secondary | ICD-10-CM | POA: Diagnosis not present

## 2019-02-25 DIAGNOSIS — C3490 Malignant neoplasm of unspecified part of unspecified bronchus or lung: Secondary | ICD-10-CM

## 2019-02-25 DIAGNOSIS — Z5111 Encounter for antineoplastic chemotherapy: Secondary | ICD-10-CM | POA: Diagnosis not present

## 2019-02-25 DIAGNOSIS — E876 Hypokalemia: Secondary | ICD-10-CM | POA: Diagnosis not present

## 2019-02-25 DIAGNOSIS — Z66 Do not resuscitate: Secondary | ICD-10-CM | POA: Insufficient documentation

## 2019-02-25 DIAGNOSIS — D509 Iron deficiency anemia, unspecified: Secondary | ICD-10-CM | POA: Diagnosis not present

## 2019-02-25 DIAGNOSIS — G8929 Other chronic pain: Secondary | ICD-10-CM | POA: Diagnosis not present

## 2019-02-25 DIAGNOSIS — M19012 Primary osteoarthritis, left shoulder: Secondary | ICD-10-CM

## 2019-02-25 DIAGNOSIS — M25512 Pain in left shoulder: Secondary | ICD-10-CM

## 2019-02-25 DIAGNOSIS — C7951 Secondary malignant neoplasm of bone: Secondary | ICD-10-CM | POA: Insufficient documentation

## 2019-02-25 DIAGNOSIS — D696 Thrombocytopenia, unspecified: Secondary | ICD-10-CM

## 2019-02-25 LAB — COMPREHENSIVE METABOLIC PANEL
ALT: 25 U/L (ref 0–44)
AST: 23 U/L (ref 15–41)
Albumin: 3.4 g/dL — ABNORMAL LOW (ref 3.5–5.0)
Alkaline Phosphatase: 96 U/L (ref 38–126)
Anion gap: 9 (ref 5–15)
BUN: 17 mg/dL (ref 8–23)
CO2: 24 mmol/L (ref 22–32)
Calcium: 8.4 mg/dL — ABNORMAL LOW (ref 8.9–10.3)
Chloride: 102 mmol/L (ref 98–111)
Creatinine, Ser: 0.72 mg/dL (ref 0.44–1.00)
GFR calc Af Amer: >60 mL/min (ref >60)
GFR calc non Af Amer: >60 mL/min (ref >60)
Glucose, Bld: 126 mg/dL — ABNORMAL HIGH (ref 70–99)
Potassium: 3.4 mmol/L — ABNORMAL LOW (ref 3.5–5.1)
Sodium: 135 mmol/L (ref 135–145)
Total Bilirubin: 0.5 mg/dL (ref 0.3–1.2)
Total Protein: 7.8 g/dL (ref 6.5–8.1)

## 2019-02-25 LAB — CBC WITH DIFFERENTIAL/PLATELET
Abs Immature Granulocytes: 0.03 10*3/uL (ref 0.00–0.07)
Basophils Absolute: 0 10*3/uL (ref 0.0–0.1)
Basophils Relative: 0 %
Eosinophils Absolute: 0 10*3/uL (ref 0.0–0.5)
Eosinophils Relative: 0 %
HCT: 32.1 % — ABNORMAL LOW (ref 36.0–46.0)
Hemoglobin: 9.6 g/dL — ABNORMAL LOW (ref 12.0–15.0)
Immature Granulocytes: 1 %
Lymphocytes Relative: 24 %
Lymphs Abs: 1.3 10*3/uL (ref 0.7–4.0)
MCH: 28.2 pg (ref 26.0–34.0)
MCHC: 29.9 g/dL — ABNORMAL LOW (ref 30.0–36.0)
MCV: 94.4 fL (ref 80.0–100.0)
Monocytes Absolute: 0.7 10*3/uL (ref 0.1–1.0)
Monocytes Relative: 13 %
Neutro Abs: 3.4 10*3/uL (ref 1.7–7.7)
Neutrophils Relative %: 62 %
Platelets: 294 10*3/uL (ref 150–400)
RBC: 3.4 MIL/uL — ABNORMAL LOW (ref 3.87–5.11)
RDW: 19.7 % — ABNORMAL HIGH (ref 11.5–15.5)
WBC: 5.5 10*3/uL (ref 4.0–10.5)
nRBC: 0 % (ref 0.0–0.2)

## 2019-02-25 MED ORDER — TOPOTECAN HCL CHEMO INJECTION 4 MG
1.3000 mg/m2 | Freq: Once | INTRAVENOUS | Status: AC
  Administered 2019-02-25: 2.7 mg via INTRAVENOUS
  Filled 2019-02-25: qty 2.7

## 2019-02-25 MED ORDER — HEPARIN SOD (PORK) LOCK FLUSH 100 UNIT/ML IV SOLN
500.0000 [IU] | Freq: Once | INTRAVENOUS | Status: DC | PRN
  Filled 2019-02-25: qty 5

## 2019-02-25 MED ORDER — SODIUM CHLORIDE 0.9% FLUSH
10.0000 mL | Freq: Once | INTRAVENOUS | Status: AC
  Administered 2019-02-25: 10 mL via INTRAVENOUS
  Filled 2019-02-25: qty 10

## 2019-02-25 MED ORDER — HEPARIN SOD (PORK) LOCK FLUSH 100 UNIT/ML IV SOLN
500.0000 [IU] | Freq: Once | INTRAVENOUS | Status: AC
  Administered 2019-02-25: 500 [IU] via INTRAVENOUS
  Filled 2019-02-25: qty 5

## 2019-02-25 MED ORDER — PROCHLORPERAZINE MALEATE 10 MG PO TABS
10.0000 mg | ORAL_TABLET | Freq: Once | ORAL | Status: AC
  Administered 2019-02-25: 10 mg via ORAL
  Filled 2019-02-25: qty 1

## 2019-02-25 MED ORDER — HEPARIN SOD (PORK) LOCK FLUSH 100 UNIT/ML IV SOLN
INTRAVENOUS | Status: AC
  Filled 2019-02-25: qty 5

## 2019-02-25 MED ORDER — SODIUM CHLORIDE 0.9 % IV SOLN
Freq: Once | INTRAVENOUS | Status: AC
  Filled 2019-02-25: qty 250

## 2019-02-25 NOTE — Patient Instructions (Signed)

## 2019-02-25 NOTE — Telephone Encounter (Signed)
Attempted to call for review of allergies/meds. Pt. On her way to another appointment, asked that I call her again later.

## 2019-02-26 ENCOUNTER — Other Ambulatory Visit: Payer: Self-pay

## 2019-02-26 ENCOUNTER — Ambulatory Visit (HOSPITAL_BASED_OUTPATIENT_CLINIC_OR_DEPARTMENT_OTHER): Payer: Medicare Other | Admitting: Pain Medicine

## 2019-02-26 ENCOUNTER — Encounter: Payer: Self-pay | Admitting: Pain Medicine

## 2019-02-26 ENCOUNTER — Inpatient Hospital Stay: Payer: Medicare Other

## 2019-02-26 ENCOUNTER — Ambulatory Visit
Admission: RE | Admit: 2019-02-26 | Discharge: 2019-02-26 | Disposition: A | Discharge status: Home / Self Care | Payer: Medicare Other | Source: Ambulatory Visit | Attending: Pain Medicine | Admitting: Pain Medicine

## 2019-02-26 VITALS — BP 102/67 | HR 84 | Temp 96.0°F | Resp 18

## 2019-02-26 VITALS — BP 124/83 | HR 97 | Temp 97.0°F | Resp 16 | Ht 65.0 in | Wt 203.0 lb

## 2019-02-26 DIAGNOSIS — M25562 Pain in left knee: Secondary | ICD-10-CM

## 2019-02-26 DIAGNOSIS — G8929 Other chronic pain: Secondary | ICD-10-CM | POA: Diagnosis not present

## 2019-02-26 DIAGNOSIS — C3490 Malignant neoplasm of unspecified part of unspecified bronchus or lung: Secondary | ICD-10-CM

## 2019-02-26 DIAGNOSIS — M1712 Unilateral primary osteoarthritis, left knee: Secondary | ICD-10-CM | POA: Diagnosis not present

## 2019-02-26 DIAGNOSIS — M19012 Primary osteoarthritis, left shoulder: Secondary | ICD-10-CM | POA: Insufficient documentation

## 2019-02-26 DIAGNOSIS — M25512 Pain in left shoulder: Secondary | ICD-10-CM

## 2019-02-26 DIAGNOSIS — M19011 Primary osteoarthritis, right shoulder: Secondary | ICD-10-CM

## 2019-02-26 DIAGNOSIS — D509 Iron deficiency anemia, unspecified: Secondary | ICD-10-CM | POA: Diagnosis not present

## 2019-02-26 DIAGNOSIS — Z5111 Encounter for antineoplastic chemotherapy: Secondary | ICD-10-CM | POA: Diagnosis not present

## 2019-02-26 DIAGNOSIS — C7951 Secondary malignant neoplasm of bone: Secondary | ICD-10-CM | POA: Diagnosis not present

## 2019-02-26 DIAGNOSIS — J449 Chronic obstructive pulmonary disease, unspecified: Secondary | ICD-10-CM | POA: Diagnosis not present

## 2019-02-26 DIAGNOSIS — C787 Secondary malignant neoplasm of liver and intrahepatic bile duct: Secondary | ICD-10-CM | POA: Diagnosis not present

## 2019-02-26 DIAGNOSIS — M25511 Pain in right shoulder: Secondary | ICD-10-CM | POA: Insufficient documentation

## 2019-02-26 MED ORDER — LIDOCAINE HCL 2 % IJ SOLN
INTRAMUSCULAR | Status: AC
  Filled 2019-02-26: qty 20

## 2019-02-26 MED ORDER — TOPOTECAN HCL CHEMO INJECTION 4 MG
1.3000 mg/m2 | Freq: Once | INTRAVENOUS | Status: AC
  Administered 2019-02-26: 2.7 mg via INTRAVENOUS
  Filled 2019-02-26: qty 2.7

## 2019-02-26 MED ORDER — METHYLPREDNISOLONE ACETATE 80 MG/ML IJ SUSP
INTRAMUSCULAR | Status: AC
  Filled 2019-02-26: qty 2

## 2019-02-26 MED ORDER — SODIUM CHLORIDE 0.9 % IV SOLN
Freq: Once | INTRAVENOUS | Status: AC
  Filled 2019-02-26: qty 250

## 2019-02-26 MED ORDER — ROPIVACAINE HCL 2 MG/ML IJ SOLN
INTRAMUSCULAR | Status: AC
  Filled 2019-02-26: qty 20

## 2019-02-26 MED ORDER — HEPARIN SOD (PORK) LOCK FLUSH 100 UNIT/ML IV SOLN
500.0000 [IU] | Freq: Once | INTRAVENOUS | Status: AC | PRN
  Administered 2019-02-26: 500 [IU]
  Filled 2019-02-26: qty 5

## 2019-02-26 MED ORDER — ROPIVACAINE HCL 2 MG/ML IJ SOLN
4.0000 mL | Freq: Once | INTRAMUSCULAR | Status: AC
  Administered 2019-02-26: 4 mL via INTRA_ARTICULAR

## 2019-02-26 MED ORDER — METHYLPREDNISOLONE ACETATE 80 MG/ML IJ SUSP
80.0000 mg | Freq: Once | INTRAMUSCULAR | Status: AC
  Administered 2019-02-26: 80 mg via INTRA_ARTICULAR

## 2019-02-26 MED ORDER — ROPIVACAINE HCL 2 MG/ML IJ SOLN
9.0000 mL | Freq: Once | INTRAMUSCULAR | Status: AC
  Administered 2019-02-26: 9 mL via INTRA_ARTICULAR

## 2019-02-26 MED ORDER — LIDOCAINE HCL 2 % IJ SOLN
20.0000 mL | Freq: Once | INTRAMUSCULAR | Status: AC
  Administered 2019-02-26: 13:00:00 400 mg

## 2019-02-26 MED ORDER — PROCHLORPERAZINE MALEATE 10 MG PO TABS
10.0000 mg | ORAL_TABLET | Freq: Once | ORAL | Status: AC
  Administered 2019-02-26: 10 mg via ORAL
  Filled 2019-02-26: qty 1

## 2019-02-26 MED ORDER — HEPARIN SOD (PORK) LOCK FLUSH 100 UNIT/ML IV SOLN
INTRAVENOUS | Status: AC
  Filled 2019-02-26: qty 5

## 2019-02-26 NOTE — Progress Notes (Signed)
PROVIDER NOTE: Information contained herein reflects review and annotations entered in association with encounter. Interpretation of such information and data should be left to medically-trained personnel. Information provided to patient can be located elsewhere in the medical record under "Patient Instructions". Document created using STT-dictation technology, any transcriptional errors that may result from process are unintentional.    Patient: Grace [name]  Service Category: Procedure  Provider: Gaspar Cola, MD  DOB: 1950-05-18  DOS: 02/26/2019  Location: [facility]  MRN: [id number]  Setting: Ambulatory - outpatient  Referring Provider: [name], MD  Type: Established Patient  Specialty: Interventional Pain Management  PCP: [name], MD   Primary Reason for Visit: Interventional Pain Management Treatment. CC: Shoulder Pain  Procedure #1:  Anesthesia, Analgesia, Anxiolysis:  Type: Palliative Intra-Articular Local anesthetic and steroid Knee Injection #3  Region: Lateral infrapatellar Knee Region Level: Knee Joint Laterality: Left knee  Type: Local Anesthesia Indication(s): Analgesia         Local Anesthetic: Lidocaine 1-2% Route: Infiltration (/IM) IV Access: Declined Sedation: Declined   Position: Sitting   Indications: 1. Chronic knee pain (Left)   2. Osteoarthritis of knee (Left)    Procedure #2:    Type: Palliative Glenohumeral and acromioclavicular joint Injection #2  Primary Purpose: Palliative Region: Superior Shoulder Area Level:  Shoulder Target Area: Glenohumeral and acromioclavicular joint Approach: Anterior approach. Laterality: Left-Sided  Position: Supine   Indications: 1. Arthropathy/Arthralgia of the shoulder (Bilateral) (R>L)   2. Chronic shoulder pain (Bilateral) (R>L)   3. Osteoarthritis glenohumeral joint (Left)   4. Chronic acromioclavicular joint pain (Left)   5. Osteoarthritis of acromioclavicular joint (Left)     Pain Score: Pre-procedure: 0-No pain/10 Post-procedure: 0-No pain/10   Pre-op Assessment:  Grace [name] is a 69 y.o. (year old), female patient, seen today for interventional treatment. She  has a past surgical history that includes Cardiac catheterization; Cesarean section; Appendectomy; Cataract extraction; Colonoscopy; Lumbar laminectomy/decompression microdiscectomy (Left, 08/01/2016); Laminotomy; and PORTA CATH INSERTION (N/A, 02/03/2017). Grace [name] has a current medication list which includes the following prescription(s): advair diskus, albuterol, alendronate, aripiprazole, duloxetine, ipratropium-albuterol, lorazepam, metoprolol succinate, morphine, pantoprazole, potassium chloride sa, prochlorperazine, promethazine, sacubitril-valsartan, varenicline, benzonatate, dextromethorphan-guaifenesin, diphenoxylate-atropine, and tizanidine, and the following Facility-Administered Medications: sodium chloride flush. Her primarily concern today is the Shoulder Pain  Initial Vital Signs:  Pulse/HCG Rate: 97ECG Heart Rate: 100 Temp: (!) 97 F (36.1 C) Resp: 16 BP: 118/78 SpO2: 94 %  BMI: Estimated body mass index is 33.78 kg/m as calculated from the following:   Height as of this encounter: 5\' 5"  (1.651 m).   Weight as of this encounter: 203 lb (92.1 kg).  Risk Assessment: Allergies: Reviewed. She is allergic to fish allergy and fentanyl.  Allergy Precautions: None required Coagulopathies: Reviewed. None identified.  Blood-thinner therapy: None at this time Active Infection(s): Reviewed. None identified. Grace [name] is afebrile  Site Confirmation: Grace [name] was asked to confirm the procedure and laterality before marking the site Procedure checklist: Completed Consent: Before the procedure and under the influence of no sedative(s), amnesic(s), or anxiolytics, the patient was informed of the treatment options, risks and possible complications. To fulfill our ethical and legal  obligations, as recommended by the American Medical Association's Code of Ethics, I have informed the patient of my clinical impression; the nature and purpose of the treatment or procedure; the risks, benefits, and possible complications of the intervention; the alternatives, including doing nothing; the risk(s) and benefit(s) of the alternative treatment(s) or procedure(s);  and the risk(s) and benefit(s) of doing nothing. The patient was provided information about the general risks and possible complications associated with the procedure. These may include, but are not limited to: failure to achieve desired goals, infection, bleeding, organ or nerve damage, allergic reactions, paralysis, and death. In addition, the patient was informed of those risks and complications associated to the procedure, such as failure to decrease pain; infection; bleeding; organ or nerve damage with subsequent damage to sensory, motor, and/or autonomic systems, resulting in permanent pain, numbness, and/or weakness of one or several areas of the body; allergic reactions; (i.e.: anaphylactic reaction); and/or death. Furthermore, the patient was informed of those risks and complications associated with the medications. These include, but are not limited to: allergic reactions (i.e.: anaphylactic or anaphylactoid reaction(s)); adrenal axis suppression; blood sugar elevation that in diabetics may result in ketoacidosis or comma; water retention that in patients with history of congestive heart failure may result in shortness of breath, pulmonary edema, and decompensation with resultant heart failure; weight gain; swelling or edema; medication-induced neural toxicity; particulate matter embolism and blood vessel occlusion with resultant organ, and/or nervous system infarction; and/or aseptic necrosis of one or more joints. Finally, the patient was informed that Medicine is not an exact science; therefore, there is also the possibility of  unforeseen or unpredictable risks and/or possible complications that may result in a catastrophic outcome. The patient indicated having understood very clearly. We have given the patient no guarantees and we have made no promises. Enough time was given to the patient to ask questions, all of which were answered to the patient's satisfaction. Ms. Brotz has indicated that she wanted to continue with the procedure. Attestation: I, the ordering provider, attest that I have discussed with the patient the benefits, risks, side-effects, alternatives, likelihood of achieving goals, and potential problems during recovery for the procedure that I have provided informed consent. Date  Time: 02/26/2019 12:42 PM  Pre-Procedure Preparation:  Monitoring: As per clinic protocol. Respiration, ETCO2, SpO2, BP, heart rate and rhythm monitor placed and checked for adequate function Safety Precautions: Patient was assessed for positional comfort and pressure points before starting the procedure. Time-out: I initiated and conducted the "Time-out" before starting the procedure, as per protocol. The patient was asked to participate by confirming the accuracy of the "Time Out" information. Verification of the correct person, site, and procedure were performed and confirmed by me, the nursing staff, and the patient. "Time-out" conducted as per Joint Commission's Universal Protocol (UP.01.01.01). Time: 1314  Description of Procedure #1:  Target Area: Knee Joint Approach: Just above the Lateral tibial plateau, lateral to the infrapatellar tendon. Area Prepped: Entire knee area, from the mid-thigh to the mid-shin. Prepping solution: DuraPrep (Iodine Povacrylex [0.7% available iodine] and Isopropyl Alcohol, 74% w/w) Safety Precautions: Aspiration looking for blood return was conducted prior to all injections. At no point did we inject any substances, as a needle was being advanced. No attempts were made at seeking any  paresthesias. Safe injection practices and needle disposal techniques used. Medications properly checked for expiration dates. SDV (single dose vial) medications used. Description of the Procedure: Protocol guidelines were followed. The patient was placed in position over the fluoroscopy table. The target area was identified and the area prepped in the usual manner. Skin & deeper tissues infiltrated with local anesthetic. Appropriate amount of time allowed to pass for local anesthetics to take effect. The procedure needles were then advanced to the target area. Proper needle placement secured. Negative aspiration  confirmed. Solution injected in intermittent fashion, asking for systemic symptoms every 0.5cc of injectate. The needles were then removed and the area cleansed, making sure to leave some of the prepping solution back to take advantage of its long term bactericidal properties. Vitals:   02/26/19 1239 02/26/19 1305 02/26/19 1314 02/26/19 1321  BP: 118/78 (!) 111/34 117/90 124/83  Pulse: 97     Resp:  16 16 16   Temp: (!) 97 F (36.1 C)     SpO2: 94% 95% 96% 96%  Weight: 203 lb (92.1 kg)     Height: 5\' 5"  (1.651 m)       Start Time: 1314 hrs. End Time: 1320 hrs. Materials:  Needle(s) Type: Regular needle Gauge: 25G Length: 1.5-in Medication(s): Please see orders for medications and dosing details.  Imaging Guidance for procedure #1:  Type of Imaging Technique: None used Indication(s): N/A Exposure Time: No patient exposure Contrast: None used. Fluoroscopic Guidance: N/A Ultrasound Guidance: N/A Interpretation: N/A  Description of Procedure #2:  Area Prepped: Entire shoulder Area Prepping solution: DuraPrep (Iodine Povacrylex [0.7% available iodine] and Isopropyl Alcohol, 74% w/w) Safety Precautions: Aspiration looking for blood return was conducted prior to all injections. At no point did we inject any substances, as a needle was being advanced. No attempts were made at seeking  any paresthesias. Safe injection practices and needle disposal techniques used. Medications properly checked for expiration dates. SDV (single dose vial) medications used. Description of the Procedure: Protocol guidelines were followed. The patient was placed in position over the procedure table. The target area was identified and the area prepped in the usual manner. Skin & deeper tissues infiltrated with local anesthetic. Appropriate amount of time allowed to pass for local anesthetics to take effect. The procedure needles were then advanced to the target area. Proper needle placement secured. Negative aspiration confirmed. Solution injected in intermittent fashion, asking for systemic symptoms every 0.5cc of injectate. The needles were then removed and the area cleansed, making sure to leave some of the prepping solution back to take advantage of its long term bactericidal properties.    Vitals:   02/26/19 1239 02/26/19 1305 02/26/19 1314 02/26/19 1321  BP: 118/78 (!) 111/34 117/90 124/83  Pulse: 97     Resp:  16 16 16   Temp: (!) 97 F (36.1 C)     SpO2: 94% 95% 96% 96%  Weight: 203 lb (92.1 kg)     Height: 5\' 5"  (1.651 m)       Start Time: 1314 hrs. End Time: 1320 hrs. Materials:  Needle(s) Type: Spinal Needle Gauge: 22G Length: 3.5-in Medication(s): Please see orders for medications and dosing details.  Imaging Guidance (Non-Spinal) for procedure #2:  Type of Imaging Technique: Fluoroscopy Guidance (Non-Spinal) Indication(s): Assistance in needle guidance and placement for procedures requiring needle placement in or near specific anatomical locations not easily accessible without such assistance. Exposure Time: Please see nurses notes. Contrast: None used. Fluoroscopic Guidance: I was personally present during the use of fluoroscopy. "Tunnel Vision Technique" used to obtain the best possible view of the target area. Parallax error corrected before commencing the procedure.  "Direction-depth-direction" technique used to introduce the needle under continuous pulsed fluoroscopy. Once target was reached, antero-posterior, oblique, and lateral fluoroscopic projection used confirm needle placement in all planes. Images permanently stored in EMR. Interpretation: No contrast injected. I personally interpreted the imaging intraoperatively. Adequate needle placement confirmed in multiple planes. Permanent images saved into the patient's record.  Antibiotic Prophylaxis:   Anti-infectives (From admission,  onward)   None     Indication(s): None identified  Post-operative Assessment:  Post-procedure Vital Signs:  Pulse/HCG Rate: 9799 Temp: (!) 97 F (36.1 C) Resp: 16 BP: 124/83 SpO2: 96 %  EBL: None  Complications: No immediate post-treatment complications observed by team, or reported by patient.  Note: The patient tolerated the entire procedure well. A repeat set of vitals were taken after the procedure and the patient was kept under observation following institutional policy, for this type of procedure. Post-procedural neurological assessment was performed, showing return to baseline, prior to discharge. The patient was provided with post-procedure discharge instructions, including a section on how to identify potential problems. Should any problems arise concerning this procedure, the patient was given instructions to immediately contact us, at any time, without hesitation. In any case, we plan to contact the patient by telephone for a follow-up status report regarding this interventional procedure.  Comments:  No additional relevant information.  Plan of Care  Orders:  Orders Placed This Encounter  Procedures  . Shoulder injection (Today)    Scheduling Instructions:     Side: Left-sided     Sedation: No Sedation.     Timeframe: Today    Order Specific Question:   Where will this procedure be performed?    Answer:   [facility]    Comments:   by  Dr. Dossie Arbour  . Steroid Knee Blk (Today)    Local Anesthetic & Steroid injection.    Scheduling Instructions:     Side(s): Left Knee     Sedation: None     Timeframe: Today    Order Specific Question:   Where will this procedure be performed?    Answer:   [facility]  . Fluoro (C-Arm) (<60 min) (No Report)    Intraoperative interpretation by procedural physician at [place].    Standing Status:   Standing    Number of Occurrences:   1    Order Specific Question:   Reason for exam:    Answer:   Assistance in needle guidance and placement for procedures requiring needle placement in or near specific anatomical locations not easily accessible without such assistance.  . Consent: Shoulder Inj.    Nursing Order: Transcribe to consent form and obtain patient signature. Note: Always confirm laterality of pain with Ms. Bommarito, before procedure. Procedure: Shoulder joint injection (glenohumeral and/or acromioclavicular joint) Indication/Reason: Diagnosis and/for treatment of shoulder pain (arthralgia) secondary to shoulder joint problems (arthropathy). Provider Attestation: I, [place] Dossie Arbour, MD, (Pain Management Specialist), the physician/practitioner, attest that I have discussed with the patient the benefits, risks, side effects, alternatives, likelihood of achieving goals and potential problems during recovery for the procedure that I have provided informed consent.  Dwain Sarna Tray    Equipment required: Single use, disposable, "Block Tray"    Standing Status:   Standing    Number of Occurrences:   1    Order Specific Question:   Specify    Answer:   Block Tray  . Consent: IA Knee inj. (Steroid) (L)    Provider Attestation: I, [place] Dossie Arbour, MD, (Pain Management Specialist), the physician/practitioner, attest that I have discussed with the patient the benefits, risks, side effects, alternatives, likelihood of achieving goals and potential problems during  recovery for the procedure that I have provided informed consent.    Scheduling Instructions:     Procedure: Left-sided intra-articular knee arthrocentesis (aspiration and/or injection)     Indications: Chronic left-sided knee pain  secondary to knee arthropathy/arthralgia     Note: Always confirm laterality of pain with Ms. Mortell, before procedure.     Transcribe to consent form and obtain patient signature.   Chronic Opioid Analgesic:  No opioid analgesics prescribed by our practice.   Medications ordered for procedure: Meds ordered this encounter  Medications  . lidocaine (XYLOCAINE) 2 % (with pres) injection 400 mg  . ropivacaine (PF) 2 mg/mL (0.2%) (NAROPIN) injection 9 mL  . methylPREDNISolone acetate (DEPO-MEDROL) injection 80 mg  . ropivacaine (PF) 2 mg/mL (0.2%) (NAROPIN) injection 4 mL  . methylPREDNISolone acetate (DEPO-MEDROL) injection 80 mg   Medications administered: We administered lidocaine, ropivacaine (PF) 2 mg/mL (0.2%), methylPREDNISolone acetate, ropivacaine (PF) 2 mg/mL (0.2%), and methylPREDNISolone acetate.  See the medical record for exact dosing, route, and time of administration.  Follow-up plan:   Return in about 2 weeks (around 03/12/2019) for (VV), (PP).       Interventional management options: Planned, scheduled, and/or pending:      Considering:   Therapeutic bilateral IA knee joint injections w/ Hyalgan Diagnostic bilateral Genicular NB Possible bilateral Genicular nerve RFA Diagnostic left suprascapular NB Possible bilateral suprascapular nerve RFA   Palliative PRN treatment(s):   Palliative right medial epicondyle elbow injection #2  Palliative right IA knee injection #3  Palliative left IA knee injection #3  Palliative right suprascapular NB #2  Palliative right IA shoulder (glenohumeral) #2  Palliative right acromioclavicular joint injection #2  Palliative left IA shoulder (glenohumeral) #2  Palliative left acromioclavicular joint  injection #2      Recent Visits Date Type Provider Dept  02/25/19 Telemedicine Milinda Pointer, MD [facility]  Showing recent visits within past 90 days and meeting all other requirements   Today's Visits Date Type Provider Dept  02/26/19 Procedure visit Milinda Pointer, MD [facility]  Showing today's visits and meeting all other requirements   Future Appointments Date Type Provider Dept  03/12/19 Appointment Milinda Pointer, MD [facility]  Showing future appointments within next 90 days and meeting all other requirements   Disposition: Discharge home  Discharge (Date  Time): 02/26/2019; 1325 hrs.   Primary Care Physician: [name], MD Location: [facility] Note by: Gaspar Cola, MD Date: 02/26/2019; Time: 1:49 PM  Disclaimer:  Medicine is not an Chief Strategy Officer. The only guarantee in medicine is that nothing is guaranteed. It is important to note that the decision to proceed with this intervention was based on the information collected from the patient. The Data and conclusions were drawn from the patient's questionnaire, the interview, and the physical examination. Because the information was provided in large part by the patient, it cannot be guaranteed that it has not been purposely or unconsciously manipulated. Every effort has been made to obtain as much relevant data as possible for this evaluation. It is important to note that the conclusions that lead to this procedure are derived in large part from the available data. Always take into account that the treatment will also be dependent on availability of resources and existing treatment guidelines, considered by other Pain Management Practitioners as being common knowledge and practice, at the time of the intervention. For Medico-Legal purposes, it is also important to point out that variation in procedural techniques and pharmacological choices are the  acceptable norm. The indications, contraindications, technique, and results of the above procedure should only be interpreted and judged by a Board-Certified Interventional Pain Specialist with extensive familiarity and expertise in  the same exact procedure and technique.

## 2019-02-26 NOTE — Progress Notes (Signed)
Safety precautions to be maintained throughout the outpatient stay will include: orient to surroundings, keep bed in low position, maintain call bell within reach at all times, provide assistance with transfer out of bed and ambulation.  

## 2019-02-26 NOTE — Patient Instructions (Signed)

## 2019-02-27 ENCOUNTER — Other Ambulatory Visit: Payer: Self-pay

## 2019-02-27 ENCOUNTER — Inpatient Hospital Stay: Payer: Medicare Other

## 2019-02-27 VITALS — BP 135/75 | HR 87 | Temp 96.7°F | Resp 17

## 2019-02-27 DIAGNOSIS — D509 Iron deficiency anemia, unspecified: Secondary | ICD-10-CM | POA: Diagnosis not present

## 2019-02-27 DIAGNOSIS — Z5111 Encounter for antineoplastic chemotherapy: Secondary | ICD-10-CM | POA: Diagnosis not present

## 2019-02-27 DIAGNOSIS — C3490 Malignant neoplasm of unspecified part of unspecified bronchus or lung: Secondary | ICD-10-CM | POA: Diagnosis not present

## 2019-02-27 DIAGNOSIS — J449 Chronic obstructive pulmonary disease, unspecified: Secondary | ICD-10-CM | POA: Diagnosis not present

## 2019-02-27 DIAGNOSIS — C787 Secondary malignant neoplasm of liver and intrahepatic bile duct: Secondary | ICD-10-CM | POA: Diagnosis not present

## 2019-02-27 DIAGNOSIS — C7951 Secondary malignant neoplasm of bone: Secondary | ICD-10-CM | POA: Diagnosis not present

## 2019-02-27 MED ORDER — PROCHLORPERAZINE MALEATE 10 MG PO TABS
10.0000 mg | ORAL_TABLET | Freq: Once | ORAL | Status: AC
  Administered 2019-02-27: 10 mg via ORAL
  Filled 2019-02-27: qty 1

## 2019-02-27 MED ORDER — TOPOTECAN HCL CHEMO INJECTION 4 MG
1.3000 mg/m2 | Freq: Once | INTRAVENOUS | Status: AC
  Administered 2019-02-27: 2.7 mg via INTRAVENOUS
  Filled 2019-02-27: qty 2.7

## 2019-02-27 MED ORDER — HEPARIN SOD (PORK) LOCK FLUSH 100 UNIT/ML IV SOLN
500.0000 [IU] | Freq: Once | INTRAVENOUS | Status: AC | PRN
  Administered 2019-02-27: 500 [IU]
  Filled 2019-02-27: qty 5

## 2019-02-27 MED ORDER — SODIUM CHLORIDE 0.9 % IV SOLN
Freq: Once | INTRAVENOUS | Status: AC
  Filled 2019-02-27: qty 250

## 2019-02-28 ENCOUNTER — Inpatient Hospital Stay: Payer: Medicare Other

## 2019-03-01 ENCOUNTER — Inpatient Hospital Stay: Payer: Medicare Other

## 2019-03-11 ENCOUNTER — Encounter: Payer: Self-pay | Admitting: Pain Medicine

## 2019-03-11 NOTE — Progress Notes (Deleted)
   Subjective:    Patient ID: Grace [name], female    DOB: 1950/04/08, 69 y.o.   MRN: [id number]  HPI    Review of Systems     Objective:   Physical Exam        Assessment & Plan:

## 2019-03-11 NOTE — Progress Notes (Signed)
Pain relief after procedure (treated area only): (Questions asked to patient) 1. Starting about 15 minutes after the procedure, and "while the area was still numb" (from the local anesthetics), were you having any of your usual pain "in that area" (the treated area)?  (NOTE: NOT including the discomfort from the needle sticks.) First 1 hour: 100 % better. First 4-6 hours: 100 % better. 2. How long did the numbness from the local anesthetics last? (More than 6 hours?) Duration: 8 hours.  3. How much better is your pain now, when compared to before the procedure? Current benefit: 99 % better. 4. Can you move better now? Improvement in ROM (Range of Motion): Yes. 5. Can you do more now? Improvement in function: Yes. 4. Did you have any problems with the procedure? Side-effects/Complications: No.

## 2019-03-12 ENCOUNTER — Other Ambulatory Visit: Payer: Self-pay

## 2019-03-12 ENCOUNTER — Ambulatory Visit: Payer: Medicare Other | Attending: Pain Medicine | Admitting: Pain Medicine

## 2019-03-12 DIAGNOSIS — M25511 Pain in right shoulder: Secondary | ICD-10-CM | POA: Diagnosis not present

## 2019-03-12 DIAGNOSIS — M25562 Pain in left knee: Secondary | ICD-10-CM | POA: Diagnosis not present

## 2019-03-12 DIAGNOSIS — G894 Chronic pain syndrome: Secondary | ICD-10-CM

## 2019-03-12 DIAGNOSIS — M25561 Pain in right knee: Secondary | ICD-10-CM | POA: Diagnosis not present

## 2019-03-12 DIAGNOSIS — M25512 Pain in left shoulder: Secondary | ICD-10-CM | POA: Diagnosis not present

## 2019-03-12 DIAGNOSIS — G8929 Other chronic pain: Secondary | ICD-10-CM

## 2019-03-12 NOTE — Progress Notes (Signed)
Patient: Grace [name]  Service Category: E/M  Provider: Gaspar Cola, MD  DOB: Sep 30, 1950  DOS: 03/12/2019  Location: Office  MRN: [id number]  Setting: Ambulatory outpatient  Referring Provider: [name], MD  Type: Established Patient  Specialty: Interventional Pain Management  PCP: [name], MD  Location: Remote location  Delivery: TeleHealth     Virtual Encounter - Pain Management PROVIDER NOTE: Information contained herein reflects review and annotations entered in association with encounter. Interpretation of such information and data should be left to medically-trained personnel. Information provided to patient can be located elsewhere in the medical record under "Patient Instructions". Document created using STT-dictation technology, any transcriptional errors that may result from process are unintentional.    Contact & Pharmacy Preferred: [phone] Home: [phone] (home) Mobile: [phone] (mobile) E-mail: [email or web address]  [id number] [address], [place] [address] Phone: [phone] Fax: [phone]   Pre-screening  Grace [name] offered "in-person" vs "virtual" encounter. Grace [name] indicated preferring virtual for this encounter.   Reason COVID-19*  Social distancing based on CDC and AMA recommendations.   I contacted Grace [name] on 03/12/2019 via telephone.      I clearly identified myself as Gaspar Cola, MD. I verified that I was speaking with the correct person using two identifiers (Name: Grace [name], and date of birth: May 29, 1950).  Consent I sought verbal advanced consent from Grace [name] for virtual visit interactions. I informed Grace [name] of possible security and privacy concerns, risks, and limitations associated with providing "not-in-person" medical evaluation and management services. I also informed Grace [name] of the availability of "in-person" appointments. Finally, I informed  her that there would be a charge for the virtual visit and that Grace [name] could be  personally, fully or partially, financially responsible for it. Grace [name] expressed understanding and agreed to proceed.   Historic Elements   Grace [name] is a 69 y.o. year old, female patient evaluated today after her last encounter by our practice on 02/26/2019. 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 Grace [name] also  has a past surgical history that includes Cardiac catheterization; Cesarean section; Appendectomy; Cataract extraction; Colonoscopy; Lumbar laminectomy/decompression microdiscectomy (Left, 08/01/2016); Laminotomy; and PORTA CATH INSERTION (N/A, 02/03/2017). Grace [name] has a current medication list which includes the following prescription(s): advair diskus, albuterol, alendronate, aripiprazole, dextromethorphan-guaifenesin, duloxetine, ipratropium-albuterol, lorazepam, metoprolol succinate, morphine, pantoprazole, potassium chloride sa, prochlorperazine, promethazine, sacubitril-valsartan, varenicline, and tizanidine, and the following Facility-Administered Medications: sodium chloride flush. Grace [name]  reports that Grace [name] quit smoking about 4 years ago. Her smoking use included cigarettes. Grace [name] has a 25.00 pack-year smoking history. Grace [name] has never used smokeless tobacco. Grace [name] reports that Grace [name] does not drink alcohol or use drugs. Grace [name] is allergic to fish allergy and fentanyl.   HPI  Today, Grace [name] is being contacted for a post-procedure  assessment.  The patient indicates that the knee injection helped the most and currently Grace [name] is not having any pain whatsoever in the left knee.  However, the left shoulder injection, even though it did help it did not completely take away the pain.  Grace [name] says that Grace [name]  currently has about 50% benefit in the left shoulder.  I asked her if Grace [name] wanted for [place] to do anything else about that and Grace [name] indicated that Grace [name] was doing well for now and that Grace [name] would be calling us if Grace [name] gets any worse.  Post-Procedure Evaluation  Procedure: Palliative left IA knee joint injection (steroids) #3 + palliative left IA shoulder joint injection #2 under fluoroscopic guidance, no sedation Pre-procedure pain level:  5/10 Post-procedure: 0/10 (100% relief)  Sedation: None.  Grace [name], Grace [name]  03/11/2019  2:17 PM  Sign when Signing Visit Pain relief after procedure (treated area only): (Questions asked to patient) 1. Starting about 15 minutes after the procedure, and "while the area was still numb" (from the local anesthetics), were you having any of your usual pain "in that area" (the treated area)?  (NOTE: NOT including the discomfort from the needle sticks.) First 1 hour: 100 % better. First 4-6 hours: 100 % better. 2. How long did the numbness from the local anesthetics last? (More than 6 hours?) Duration: 8 hours.  3. How much better is your pain now, when compared to before the procedure? Current benefit: 99 % better. 4. Can you move better now? Improvement in ROM (Range of Motion): Yes. 5. Can you do more now? Improvement in function: Yes. 4. Did you have any problems with the procedure? Side-effects/Complications: No.  Current benefits: Defined as benefit that persist at this time.   Analgesia:  90-100% better Function: Grace [name] reports improvement in function ROM: Grace [name] reports improvement in ROM  Pharmacotherapy Assessment  Analgesic: No opioid analgesics prescribed by our practice.    Monitoring: Pharmacotherapy: No side-effects or adverse reactions reported. [place] PMP: PDMP not reviewed this encounter.       Compliance: No problems identified. Effectiveness: Clinically acceptable. Plan: Refer to "POC".  UDS: No results found for: SUMMARY Laboratory Chemistry Profile (12 mo)  Renal: 02/25/2019: BUN 17; Creatinine, Ser 0.72  Lab Results  Component Value Date   GFRAA >60 02/25/2019   GFRNONAA >60 02/25/2019   Hepatic: 02/25/2019: Albumin 3.4 Lab Results  Component Value Date   AST 23 02/25/2019   ALT 25 02/25/2019   Other: No results found for requested labs within last 8760 hours.  Note: Above Lab results reviewed.  Imaging  Fluoro (C-Arm) (<60 min) (No Report) Fluoro was used, but no Radiologist interpretation will be provided.  Please refer to "NOTES" tab for provider progress note.   Assessment  The primary encounter diagnosis was Chronic pain syndrome. Diagnoses of Chronic shoulder pain (Bilateral) (R>L), Chronic acromioclavicular joint pain (Left), and Chronic knee pain (Primary Area of Pain) (Bilateral) were also pertinent to this visit.  Plan of Care  Problem-specific:  No problem-specific Assessment & Plan notes found for this encounter.  I have discontinued Grace [name]'s benzonatate and diphenoxylate-atropine. I am also having her maintain her DULoxetine, ARIPiprazole, alendronate, albuterol, ipratropium-albuterol, Advair Diskus, LORazepam, pantoprazole, potassium chloride SA, promethazine, sacubitril-valsartan, tiZANidine, dextromethorphan-guaiFENesin, metoprolol succinate, prochlorperazine, varenicline, and morphine.  Pharmacotherapy (Medications Ordered): No orders of the defined types were placed in this encounter.  Orders:  No orders of the defined types were placed in this encounter.  Follow-up plan:   Return if symptoms worsen or fail to improve.      Interventional management options: Planned, scheduled, and/or pending:       Considering:   Therapeutic bilateral IA knee joint injections w/ Hyalgan Diagnostic bilateral Genicular NB Possible bilateral Genicular nerve RFA Diagnostic left suprascapular  NB Possible bilateral suprascapular nerve RFA   Palliative PRN treatment(s):   Palliative right medial epicondyle elbow injection #2  Palliative right IA knee injection #3  Palliative left IA knee injection #4  Palliative right suprascapular NB #2  Palliative right IA shoulder (glenohumeral) #2  Palliative right acromioclavicular joint injection #2  Palliative left IA shoulder (glenohumeral) #3  Palliative left acromioclavicular joint injection #2     Recent Visits Date Type Provider Dept  02/26/19 Procedure visit Milinda Pointer, MD [facility]  02/25/19 Telemedicine Milinda Pointer, MD [facility]  Showing recent visits within past 90 days and meeting all other requirements   Today's Visits Date Type Provider Dept  03/12/19 Telemedicine Milinda Pointer, MD [facility]  Showing today's visits and meeting all other requirements   Future Appointments No visits were found meeting these conditions.  Showing future appointments within next 90 days and meeting all other requirements   I discussed the assessment and treatment plan with the patient. The patient was provided an opportunity to ask questions and all were answered. The patient agreed with the plan and demonstrated an understanding of the instructions.  Patient advised to call back or seek an in-person evaluation if the symptoms or condition worsens.  Duration of encounter: 13 minutes.  Note by: Gaspar Cola, MD Date: 03/12/2019; Time: 9:57 AM

## 2019-03-14 DIAGNOSIS — M5416 Radiculopathy, lumbar region: Secondary | ICD-10-CM | POA: Diagnosis not present

## 2019-03-19 ENCOUNTER — Telehealth: Payer: Self-pay | Admitting: Pain Medicine

## 2019-03-19 ENCOUNTER — Other Ambulatory Visit: Payer: Self-pay | Admitting: Pain Medicine

## 2019-03-19 DIAGNOSIS — M48062 Spinal stenosis, lumbar region with neurogenic claudication: Secondary | ICD-10-CM

## 2019-03-19 NOTE — Telephone Encounter (Addendum)
Patient lvmail stating she saw the neurosurgeon and they recommended her having an epid. She would like to schedule this asap.  We will need order for this Please check with DR. Dossie Arbour

## 2019-03-19 NOTE — Telephone Encounter (Signed)
I have sent Dr. Dossie Arbour a bubble about this and will contact patient with his advise.

## 2019-03-20 NOTE — Telephone Encounter (Signed)
Called patient at Dr. Adalberto Cole request. She has already been scheduled per KW for LESI.  She states that Dr. Trenton Gammon ([facility]) has recommended that she have a LESI. Her pain is low back, bilateral posterior legs to the knee(L>R) She complains that her feet are also numb( R>L). She denies taking blood thinners, denies wanting sedation, denies any vaccines in the last 2 weeks. Scheduled for 8:15 tomorrow and confirmed.

## 2019-03-21 ENCOUNTER — Ambulatory Visit: Payer: Medicare Other | Admitting: Pain Medicine

## 2019-03-21 ENCOUNTER — Telehealth: Payer: Self-pay | Admitting: Pain Medicine

## 2019-03-21 DIAGNOSIS — M47817 Spondylosis without myelopathy or radiculopathy, lumbosacral region: Secondary | ICD-10-CM | POA: Insufficient documentation

## 2019-03-21 DIAGNOSIS — G8929 Other chronic pain: Secondary | ICD-10-CM | POA: Insufficient documentation

## 2019-03-21 DIAGNOSIS — R937 Abnormal findings on diagnostic imaging of other parts of musculoskeletal system: Secondary | ICD-10-CM | POA: Insufficient documentation

## 2019-03-21 DIAGNOSIS — M5137 Other intervertebral disc degeneration, lumbosacral region: Secondary | ICD-10-CM | POA: Insufficient documentation

## 2019-03-21 DIAGNOSIS — M5442 Lumbago with sciatica, left side: Secondary | ICD-10-CM | POA: Insufficient documentation

## 2019-03-21 NOTE — Telephone Encounter (Signed)
Patient is unable to get here this morning, her daughters car messed up. She was asking if they could come later today? Or next week she is scheduled for infusions on both Tue and Thurs. Is is ok if she comes for procedure on one of those days?

## 2019-03-21 NOTE — Progress Notes (Deleted)
PROVIDER NOTE: Information contained herein reflects review and annotations entered in association with encounter. Interpretation of such information and data should be left to medically-trained personnel. Information provided to patient can be located elsewhere in the medical record under "Patient Instructions". Document created using STT-dictation technology, any transcriptional errors that may result from process are unintentional.    Patient: Grace [name]  Service Category: Procedure  Provider: Gaspar Cola, MD  DOB: [birth year]  DOS: 03/21/2019  Location: [facility]  MRN: [id number]  Setting: Ambulatory - outpatient  Referring Provider: Milinda Pointer, MD  Type: Established Patient  Specialty: Interventional Pain Management  PCP: [name], MD   Primary Reason for Visit: Interventional Pain Management Treatment. CC: No chief complaint on file.  Procedure:          Anesthesia, Analgesia, Anxiolysis:  Type: Diagnostic Inter-Laminar Epidural Steroid Injection  #1  Region: Lumbar Level: L4-5 Level. Laterality: Left-Sided Paramedial  Type: Local Anesthesia Indication(s): Analgesia         Route: Infiltration ([facility]/IM) IV Access: Declined Sedation: Declined  Local Anesthetic: Lidocaine 1-2%  Position: Prone with head of the table was raised to facilitate breathing.   Indications: 1. Lumbar stenosis with neurogenic claudication   2. DDD (degenerative disc disease), lumbosacral   3. 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 Abnormal MRI, lumbar spine (03/14/2016)   5. Abnormal MRI, thoracic spine (03/14/2016)   6. Lumbosacral facet arthropathy    Pain Score: Pre-procedure:  /10 Post-procedure:  /10   Pre-op Assessment:  Grace [name] is a 69 y.o. (year old), female patient, seen today for interventional treatment. She  has a past surgical history that includes Cardiac catheterization; Cesarean section; Appendectomy;  Cataract extraction; Colonoscopy; Lumbar laminectomy/decompression microdiscectomy (Left, 08/01/2016); Laminotomy; and PORTA CATH INSERTION (N/A, 02/03/2017). Grace [name] has a current medication list which includes the following prescription(s): advair diskus, albuterol, alendronate, aripiprazole, dextromethorphan-guaifenesin, duloxetine, ipratropium-albuterol, lorazepam, metoprolol succinate, morphine, pantoprazole, potassium chloride sa, prochlorperazine, promethazine, sacubitril-valsartan, tizanidine, and varenicline, and the following Facility-Administered Medications: sodium chloride flush. Her primarily concern today is the No chief complaint on file.  Initial Vital Signs:  Pulse/HCG Rate:    Temp:   Resp:   BP:   SpO2:    BMI: Estimated body mass index is 33.78 kg/m as calculated from the following:   Height as of 02/26/19: 5\' 5"  (1.651 m).   Weight as of 02/26/19: 203 lb (92.1 kg).  Risk Assessment: Allergies: Reviewed. She is allergic to fish allergy and fentanyl.  Allergy Precautions: None required Coagulopathies: Reviewed. None identified.  Blood-thinner therapy: None at this time Active Infection(s): Reviewed. None identified. Grace [name] is afebrile  Site Confirmation: Grace [name] was asked to confirm the procedure and laterality before marking the site Procedure checklist: Completed Consent: Before the procedure and under the influence of no sedative(s), amnesic(s), or anxiolytics, the patient was informed of the treatment options, risks and possible complications. To fulfill our ethical and legal obligations, as recommended by the American Medical Association's Code of Ethics, I have informed the patient of my clinical impression; the nature and purpose of the treatment or procedure; the risks, benefits, and possible complications of the intervention; the alternatives, including doing nothing; the risk(s) and benefit(s) of the alternative treatment(s) or procedure(s); and the  risk(s) and benefit(s) of doing nothing. The patient was provided information about the general risks and possible complications associated with the procedure. These may include, but are not limited to: failure  to achieve desired goals, infection, bleeding, organ or nerve damage, allergic reactions, paralysis, and death. In addition, the patient was informed of those risks and complications associated to Spine-related procedures, such as failure to decrease pain; infection (i.e.: Meningitis, epidural or intraspinal abscess); bleeding (i.e.: epidural hematoma, subarachnoid hemorrhage, or any other type of intraspinal or peri-dural bleeding); organ or nerve damage (i.e.: Any type of peripheral nerve, nerve root, or spinal cord injury) with subsequent damage to sensory, motor, and/or autonomic systems, resulting in permanent pain, numbness, and/or weakness of one or several areas of the body; allergic reactions; (i.e.: anaphylactic reaction); and/or death. Furthermore, the patient was informed of those risks and complications associated with the medications. These include, but are not limited to: allergic reactions (i.e.: anaphylactic or anaphylactoid reaction(s)); adrenal axis suppression; blood sugar elevation that in diabetics may result in ketoacidosis or comma; water retention that in patients with history of congestive heart failure may result in shortness of breath, pulmonary edema, and decompensation with resultant heart failure; weight gain; swelling or edema; medication-induced neural toxicity; particulate matter embolism and blood vessel occlusion with resultant organ, and/or nervous system infarction; and/or aseptic necrosis of one or more joints. Finally, the patient was informed that Medicine is not an exact science; therefore, there is also the possibility of unforeseen or unpredictable risks and/or possible complications that may result in a catastrophic outcome. The patient indicated having  understood very clearly. We have given the patient no guarantees and we have made no promises. Enough time was given to the patient to ask questions, all of which were answered to the patient's satisfaction. Grace [name] has indicated that she wanted to continue with the procedure. Attestation: I, the ordering provider, attest that I have discussed with the patient the benefits, risks, side-effects, alternatives, likelihood of achieving goals, and potential problems during recovery for the procedure that I have provided informed consent. Date  Time: {CHL [facility]-PAIN TIME CHOICES:[id number]}  Pre-Procedure Preparation:  Monitoring: As per clinic protocol. Respiration, ETCO2, SpO2, BP, heart rate and rhythm monitor placed and checked for adequate function Safety Precautions: Patient was assessed for positional comfort and pressure points before starting the procedure. Time-out: I initiated and conducted the "Time-out" before starting the procedure, as per protocol. The patient was asked to participate by confirming the accuracy of the "Time Out" information. Verification of the correct person, site, and procedure were performed and confirmed by me, the nursing staff, and the patient. "Time-out" conducted as per Joint Commission's Universal Protocol (UP.01.01.01). Time:    Description of Procedure:          Target Area: The interlaminar space, initially targeting the lower laminar border of the superior vertebral body. Approach: Paramedial approach. Area Prepped: Entire Posterior Lumbar Region Prepping solution: DuraPrep (Iodine Povacrylex [0.7% available iodine] and Isopropyl Alcohol, 74% w/w) Safety Precautions: Aspiration looking for blood return was conducted prior to all injections. At no point did we inject any substances, as a needle was being advanced. No attempts were made at seeking any paresthesias. Safe injection practices and needle disposal techniques used. Medications properly checked for  expiration dates. SDV (single dose vial) medications used. Description of the Procedure: Protocol guidelines were followed. The procedure needle was introduced through the skin, ipsilateral to the reported pain, and advanced to the target area. Bone was contacted and the needle walked caudad, until the lamina was cleared. The epidural space was identified using "loss-of-resistance technique" with 2-3 ml of PF-NaCl (0.9% NSS), in a 5cc LOR glass syringe.  There were no vitals filed for this visit.  Start Time:   hrs. End Time:   hrs.  Materials:  Needle(s) Type: Epidural needle Gauge: 17G Length: 3.5-in Medication(s): Please see orders for medications and dosing details.  Imaging Guidance (Spinal):          Type of Imaging Technique: Fluoroscopy Guidance (Spinal) Indication(s): Assistance in needle guidance and placement for procedures requiring needle placement in or near specific anatomical locations not easily accessible without such assistance. Exposure Time: Please see nurses notes. Contrast: Before injecting any contrast, we confirmed that the patient did not have an allergy to iodine, shellfish, or radiological contrast. Once satisfactory needle placement was completed at the desired level, radiological contrast was injected. Contrast injected under live fluoroscopy. No contrast complications. See chart for type and volume of contrast used. Fluoroscopic Guidance: I was personally present during the use of fluoroscopy. "Tunnel Vision Technique" used to obtain the best possible view of the target area. Parallax error corrected before commencing the procedure. "Direction-depth-direction" technique used to introduce the needle under continuous pulsed fluoroscopy. Once target was reached, antero-posterior, oblique, and lateral fluoroscopic projection used confirm needle placement in all planes. Images permanently stored in EMR. Interpretation: I personally interpreted the imaging  intraoperatively. Adequate needle placement confirmed in multiple planes. Appropriate spread of contrast into desired area was observed. No evidence of afferent or efferent intravascular uptake. No intrathecal or subarachnoid spread observed. Permanent images saved into the patient's record.  Antibiotic Prophylaxis:   Anti-infectives (From admission, onward)   None     Indication(s): None identified  Post-operative Assessment:  Post-procedure Vital Signs:  Pulse/HCG Rate:    Temp:   Resp:   BP:   SpO2:    EBL: None  Complications: No immediate post-treatment complications observed by team, or reported by patient.  Note: The patient tolerated the entire procedure well. A repeat set of vitals were taken after the procedure and the patient was kept under observation following institutional policy, for this type of procedure. Post-procedural neurological assessment was performed, showing return to baseline, prior to discharge. The patient was provided with post-procedure discharge instructions, including a section on how to identify potential problems. Should any problems arise concerning this procedure, the patient was given instructions to immediately contact us, at any time, without hesitation. In any case, we plan to contact the patient by telephone for a follow-up status report regarding this interventional procedure.  Comments:  No additional relevant information.  Plan of Care  Orders:  No orders of the defined types were placed in this encounter.  Chronic Opioid Analgesic:  No opioid analgesics prescribed by our practice.   Medications ordered for procedure: No orders of the defined types were placed in this encounter.  Medications administered: Grace [name] had no medications administered during this visit.  See the medical record for exact dosing, route, and time of administration.  Follow-up plan:   No follow-ups on file.       Interventional management  options: Planned, scheduled, and/or pending:      Considering:   Therapeutic bilateral IA knee joint injections w/ Hyalgan Diagnostic bilateral Genicular NB Possible bilateral Genicular nerve RFA Diagnostic left suprascapular NB Possible bilateral suprascapular nerve RFA   Palliative PRN treatment(s):   Palliative right medial epicondyle elbow injection #2  Palliative right IA knee injection #3  Palliative left IA knee injection #4  Palliative right suprascapular NB #2  Palliative right IA shoulder (glenohumeral) #2  Palliative right acromioclavicular joint injection #2  Palliative left IA shoulder (glenohumeral) #3  Palliative left acromioclavicular joint injection #2     Recent Visits Date Type Provider Dept  03/12/19 Telemedicine Milinda Pointer, MD [facility]  02/26/19 Procedure visit Milinda Pointer, MD [facility]  02/25/19 Telemedicine Milinda Pointer, MD [facility]  Showing recent visits within past 90 days and meeting all other requirements   Today's Visits Date Type Provider Dept  03/21/19 Appointment Milinda Pointer, MD [facility]  Showing today's visits and meeting all other requirements   Future Appointments No visits were found meeting these conditions.  Showing future appointments within next 90 days and meeting all other requirements   Disposition: Discharge home  Discharge (Date  Time): 03/21/2019;   hrs.   Primary Care Physician: [name], MD Location: [facility] Note by: Gaspar Cola, MD Date: 03/21/2019; Time: 7:35 AM  Disclaimer:  Medicine is not an Chief Strategy Officer. The only guarantee in medicine is that nothing is guaranteed. It is important to note that the decision to proceed with this intervention was based on the information collected from the patient. The Data and conclusions were drawn from the patient's questionnaire, the interview, and the  physical examination. Because the information was provided in large part by the patient, it cannot be guaranteed that it has not been purposely or unconsciously manipulated. Every effort has been made to obtain as much relevant data as possible for this evaluation. It is important to note that the conclusions that lead to this procedure are derived in large part from the available data. Always take into account that the treatment will also be dependent on availability of resources and existing treatment guidelines, considered by other Pain Management Practitioners as being common knowledge and practice, at the time of the intervention. For Medico-Legal purposes, it is also important to point out that variation in procedural techniques and pharmacological choices are the acceptable norm. The indications, contraindications, technique, and results of the above procedure should only be interpreted and judged by a Board-Certified Interventional Pain Specialist with extensive familiarity and expertise in the same exact procedure and technique.

## 2019-03-21 NOTE — Telephone Encounter (Signed)
Patient has appt on 03/26/19 for [facility].

## 2019-03-22 ENCOUNTER — Other Ambulatory Visit: Payer: Self-pay

## 2019-03-22 ENCOUNTER — Ambulatory Visit
Admission: RE | Admit: 2019-03-22 | Discharge: 2019-03-22 | Disposition: A | Discharge status: Home / Self Care | Payer: Medicare Other | Source: Ambulatory Visit | Attending: Oncology | Admitting: Oncology

## 2019-03-22 DIAGNOSIS — C3490 Malignant neoplasm of unspecified part of unspecified bronchus or lung: Secondary | ICD-10-CM | POA: Diagnosis not present

## 2019-03-22 MED ORDER — IOHEXOL 300 MG/ML  SOLN
100.0000 mL | Freq: Once | INTRAMUSCULAR | Status: AC | PRN
  Administered 2019-03-22: 100 mL via INTRAVENOUS

## 2019-03-22 NOTE — Progress Notes (Deleted)
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  INTERVAL HISTORY: Patient returns to clinic today for further evaluation and consideration of cycle 16 of monthly topotecan.  She currently feels well and is asymptomatic.  She has chronic weakness and fatigue.  Her pain and nausea are well controlled on her current medications. She has no neurologic complaints.  She denies any recent fevers or illnesses.  She has a good appetite and denies weight loss.  She has chronic shortness of breath, but denies any chest pain, cough, or hemoptysis. She has no abdominal pain.  She denies any vomiting, constipation, or diarrhea.  She denies any melena or hematochezia.  She has no urinary complaints.  Patient offers no specific complaints today.  REVIEW OF SYSTEMS:   Review of Systems  Constitutional: Positive for malaise/fatigue. Negative for fever and weight loss.  Respiratory: Negative.  Negative for cough, hemoptysis, shortness of breath and wheezing.   Cardiovascular: Negative.  Negative for chest pain and leg swelling.  Gastrointestinal: Negative for abdominal pain, blood in stool, diarrhea, melena, nausea and vomiting.  Genitourinary: Negative.  Negative for dysuria and frequency.  Musculoskeletal: Positive for back pain, joint pain and neck pain.  Skin: Negative.  Negative for rash.  Neurological: Positive for weakness. Negative for sensory change, focal weakness and headaches.  Psychiatric/Behavioral: Negative.  Negative for memory loss. The patient is not nervous/anxious.     As per HPI. Otherwise, a complete review of systems is negative.  PAST MEDICAL HISTORY: Past Medical History:  Diagnosis Date    . Anxiety   . Arthritis   . Asthma    COPD  . CAP (community acquired pneumonia) 06/30/2014  . Cardiomyopathy ([place])    nonischemic (EF 35-40%)  . CHF (congestive heart failure) ([place])   . COPD (chronic obstructive pulmonary disease) ([place])   . Depression   . Dyspnea   . GERD (gastroesophageal reflux disease)   . Hyperlipidemia   . Hypertension   . Pneumonia   . PVC's (premature ventricular contractions)    states 10 years ago  . Sepsis ([place]) 06/30/2014  . Small cell lung cancer ([place]) 12/2016   Chemo tx's.  . SOB (shortness of breath) 05/08/2014  . Spinal cord injury at T7-T12 level [facility])    2016    PAST SURGICAL HISTORY: Past Surgical History:  Procedure Laterality Date  . APPENDECTOMY    . CARDIAC CATHETERIZATION    . CATARACT EXTRACTION     right eye   . CESAREAN SECTION    . COLONOSCOPY    . LAMINOTOMY    . LUMBAR LAMINECTOMY/DECOMPRESSION MICRODISCECTOMY Left 08/01/2016   Procedure: Left Lumbar Four-Five Laminectomy and Foraminotomy;  Surgeon: Earnie Larsson, MD;  Location: [place];  Service: Neurosurgery;  Laterality: Left;  . PORTA CATH INSERTION N/A 02/03/2017   Procedure: PORTA CATH INSERTION;  Surgeon: Katha Cabal, MD;  Location: [facility];  Service: Cardiovascular;  Laterality: N/A;    FAMILY HISTORY: Family History  Problem Relation Age of Onset  . Heart attack Father 14       MI x 3     ADVANCED DIRECTIVES (Y/N):  N  HEALTH MAINTENANCE: Social History   Tobacco Use  . Smoking status:  Former Smoker    Packs/day: 0.50    Years: 50.00    Pack years: 25.00    Types: Cigarettes    Quit date: 01/27/2015    Years since quitting: 4.1  . Smokeless tobacco: Never Used  Substance Use Topics  . Alcohol use: No  . Drug use: No     Colonoscopy:  PAP:  Bone density:  Lipid panel:  Allergies  Allergen Reactions  . Fish Allergy Anaphylaxis    Per pt after eating [facility] she had throat swelling and SOB. MSY  Per pt after eating [facility] she had throat swelling and SOB. MSY   . Fentanyl     Overly sedated and groggy, ineffective     Current Outpatient Medications  Medication Sig Dispense Refill  . ADVAIR DISKUS 250-50 MCG/DOSE AEPB Inhale 1 puff into the lungs 2 (two) times daily.    Marland Kitchen albuterol (PROAIR HFA) 108 (90 Base) MCG/ACT inhaler Inhale 2 puffs into the lungs every 6 (six) hours as needed for wheezing or shortness of breath.     Marland Kitchen alendronate (FOSAMAX) 70 MG tablet Take 70 mg by mouth every Thursday.     . ARIPiprazole (ABILIFY) 5 MG tablet Take 5 mg by mouth daily.    Marland Kitchen dextromethorphan-guaiFENesin (MUCINEX DM) 30-600 MG 12hr tablet Take 1 tablet by mouth 2 (two) times daily as needed for cough.    . DULoxetine (CYMBALTA) 60 MG capsule Take 60 mg by mouth daily.    Marland Kitchen ipratropium-albuterol (DUONEB) 0.5-2.5 (3) MG/3ML SOLN Take 3 mLs by nebulization every 4 (four) hours as needed. 360 mL 1  . LORazepam (ATIVAN) 0.5 MG tablet Take 0.5 mg by mouth as needed.    . metoprolol succinate (TOPROL-XL) 100 MG 24 hr tablet Take 1 tablet by mouth 1 day or 1 dose.    . morphine (MS CONTIN) 30 MG 12 hr tablet Take 1 tablet (30 mg total) by mouth every 8 (eight) hours. 90 tablet 0  . pantoprazole (PROTONIX) 40 MG tablet Take 1 tablet by mouth daily.    . potassium chloride SA (K-DUR,KLOR-CON) 20 MEQ tablet Take 1 tablet (20 mEq total) by mouth daily. 30 tablet 2  . prochlorperazine (COMPAZINE) 10 MG tablet TAKE 1 TABLET (10 MG TOTAL) BY MOUTH EVERY 6 (SIX) HOURS AS NEEDED (NAUSEA OR VOMITING). 60 tablet 2  . promethazine (PHENERGAN) 25 MG tablet Take 1 tablet (25 mg total) by mouth every 8 (eight) hours as needed for nausea or vomiting. 30 tablet 2  . sacubitril-valsartan (ENTRESTO) 24-26 MG Take 1 tablet by mouth 2 (two) times daily. (Patient taking differently: Take 1 tablet by mouth daily. Pt taking 5mg ) 180 tablet 3  . tiZANidine (ZANAFLEX) 4 MG tablet Take 1 tablet (4 mg total) by mouth every 8 (eight) hours as needed for  muscle spasms. 90 tablet 2  . varenicline (CHANTIX) 1 MG tablet Take 1 mg by mouth 2 (two) times daily.     No current facility-administered medications for this visit.   Facility-Administered Medications Ordered in Other Visits  Medication Dose Route Frequency Provider Last Rate Last Admin  . sodium chloride flush (NS) 0.9 % injection 10 mL  10 mL Intravenous PRN Lloyd Huger, MD   10 mL at 03/01/17 0841    OBJECTIVE: There were no vitals filed for this visit.   There is no height or weight on file to calculate BMI.    ECOG FS:1 - Symptomatic but completely ambulatory  General: Well-developed,  well-nourished, no acute distress.  Sitting in a wheelchair. Eyes: Pink conjunctiva, anicteric sclera. HEENT: Normocephalic, moist mucous membranes. Lungs: No audible wheezing or coughing. Heart: Regular rate and rhythm. Abdomen: Soft, nontender, no obvious distention. Musculoskeletal: No edema, cyanosis, or clubbing. Neuro: Alert, answering all questions appropriately. Cranial nerves grossly intact. Skin: No rashes or petechiae noted. Psych: Normal affect.  LAB RESULTS:  Lab Results  Component Value Date   NA 135 02/25/2019   K 3.4 (L) 02/25/2019   CL 102 02/25/2019   CO2 24 02/25/2019   GLUCOSE 126 (H) 02/25/2019   BUN 17 02/25/2019   CREATININE 0.72 02/25/2019   CALCIUM 8.4 (L) 02/25/2019   PROT 7.8 02/25/2019   ALBUMIN 3.4 (L) 02/25/2019   AST 23 02/25/2019   ALT 25 02/25/2019   ALKPHOS 96 02/25/2019   BILITOT 0.5 02/25/2019   GFRNONAA >60 02/25/2019   GFRAA >60 02/25/2019    Lab Results  Component Value Date   WBC 5.5 02/25/2019   NEUTROABS 3.4 02/25/2019   HGB 9.6 (L) 02/25/2019   HCT 32.1 (L) 02/25/2019   MCV 94.4 02/25/2019   PLT 294 02/25/2019     STUDIES: Fluoro (C-Arm) (<60 min) (No Report)  Result Date: 02/26/2019 Fluoro was used, but no Radiologist interpretation will be provided. Please refer to "NOTES" tab for provider progress  note.   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  PLAN:  1. 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  Patient then received maintenance Tecentriq from Jun 21, 2017 through November 15, 2017.  CT scan results from December 26, 2018 reviewed independently with essentially stable disease.  Previously, hospice was discussed, patient wished to pursue aggressive treatment.  She was initially scheduled to get topotecan on days 1 through 5 every 21 days, but given her persistent thrombocytopenia she can only receive treatment every 28 days.  Proceed with cycle 16, day 1 of dose reduced topotecan today.  Return to clinic Tuesday through Friday for topotecan only.  Patient will then return to clinic in 4 weeks for further evaluation and consideration of cycle 17.  Plan to reimage prior to cycle 17.   2. Pain: Chronic and unchanged.  Continue MS Contin to 30 mg every 8 hours. Patient states she has "fired" her pain doctor.   3. Thrombocytopenia: Resolved.  Proceed with treatment every 28 days as above. 4.  Anemia: Hemoglobin slowly trending down is now 9.6.  Monitor. 5.  COPD: Chronic and unchanged.  Continue outpatient palliative care.  Continue current medications as prescribed. 6.  Hypokalemia: Mild.  Continue oral potassium supplementation. 7.  Disposition: Hospice and end-of-life care were previously discussed, but patient wishes to continue aggressive treatments.  She expressed understanding that her treatment options are limited.  She confirms that she is a DNR and is having outpatient palliative care visit. 8.  Tobacco use: Patient states she recently started smoking again.  She expressed understanding that this may increase her risk of recurrence.  She also recently started Chantix in order to quit again.   Patient  expressed understanding and was in agreement with this plan. She also understands that She can call clinic at any time with any questions, concerns, or complaints.   Cancer Staging Small cell lung cancer [facility]) Staging form: Lung, AJCC 8th Edition - Clinical stage from 01/28/2017: Stage  IV (cT4, cN3, pM1c) - Signed by Lloyd Huger, MD on 01/28/2017   Lloyd Huger, MD   03/22/2019 6:02 AM

## 2019-03-25 ENCOUNTER — Inpatient Hospital Stay: Payer: Medicare Other

## 2019-03-25 ENCOUNTER — Inpatient Hospital Stay: Payer: Medicare Other | Admitting: Oncology

## 2019-03-26 ENCOUNTER — Inpatient Hospital Stay (HOSPITAL_BASED_OUTPATIENT_CLINIC_OR_DEPARTMENT_OTHER): Payer: Medicare Other | Admitting: Oncology

## 2019-03-26 ENCOUNTER — Other Ambulatory Visit: Payer: Self-pay

## 2019-03-26 ENCOUNTER — Encounter: Payer: Self-pay | Admitting: Pain Medicine

## 2019-03-26 ENCOUNTER — Ambulatory Visit
Admission: RE | Admit: 2019-03-26 | Discharge: 2019-03-26 | Disposition: A | Discharge status: Home / Self Care | Payer: Medicare Other | Source: Ambulatory Visit | Attending: Pain Medicine | Admitting: Pain Medicine

## 2019-03-26 ENCOUNTER — Encounter: Payer: Self-pay | Admitting: Oncology

## 2019-03-26 ENCOUNTER — Ambulatory Visit (HOSPITAL_BASED_OUTPATIENT_CLINIC_OR_DEPARTMENT_OTHER): Payer: Medicare Other | Admitting: Pain Medicine

## 2019-03-26 ENCOUNTER — Inpatient Hospital Stay: Payer: Medicare Other

## 2019-03-26 ENCOUNTER — Inpatient Hospital Stay: Payer: Medicare Other | Attending: Oncology

## 2019-03-26 VITALS — BP 109/75 | HR 58 | Temp 98.0°F | Resp 16 | Wt 206.0 lb

## 2019-03-26 VITALS — BP 132/70 | HR 79 | Temp 97.2°F | Resp 16 | Ht 65.0 in | Wt 206.0 lb

## 2019-03-26 DIAGNOSIS — C3490 Malignant neoplasm of unspecified part of unspecified bronchus or lung: Secondary | ICD-10-CM

## 2019-03-26 DIAGNOSIS — M5137 Other intervertebral disc degeneration, lumbosacral region: Secondary | ICD-10-CM | POA: Insufficient documentation

## 2019-03-26 DIAGNOSIS — C7951 Secondary malignant neoplasm of bone: Secondary | ICD-10-CM | POA: Diagnosis not present

## 2019-03-26 DIAGNOSIS — M5441 Lumbago with sciatica, right side: Secondary | ICD-10-CM | POA: Diagnosis not present

## 2019-03-26 DIAGNOSIS — D649 Anemia, unspecified: Secondary | ICD-10-CM | POA: Insufficient documentation

## 2019-03-26 DIAGNOSIS — G8929 Other chronic pain: Secondary | ICD-10-CM

## 2019-03-26 DIAGNOSIS — D696 Thrombocytopenia, unspecified: Secondary | ICD-10-CM | POA: Diagnosis not present

## 2019-03-26 DIAGNOSIS — J449 Chronic obstructive pulmonary disease, unspecified: Secondary | ICD-10-CM | POA: Insufficient documentation

## 2019-03-26 DIAGNOSIS — Z5111 Encounter for antineoplastic chemotherapy: Secondary | ICD-10-CM | POA: Insufficient documentation

## 2019-03-26 DIAGNOSIS — G629 Polyneuropathy, unspecified: Secondary | ICD-10-CM | POA: Insufficient documentation

## 2019-03-26 DIAGNOSIS — M48062 Spinal stenosis, lumbar region with neurogenic claudication: Secondary | ICD-10-CM

## 2019-03-26 DIAGNOSIS — M79605 Pain in left leg: Secondary | ICD-10-CM | POA: Insufficient documentation

## 2019-03-26 DIAGNOSIS — M5416 Radiculopathy, lumbar region: Secondary | ICD-10-CM | POA: Diagnosis not present

## 2019-03-26 DIAGNOSIS — C787 Secondary malignant neoplasm of liver and intrahepatic bile duct: Secondary | ICD-10-CM | POA: Insufficient documentation

## 2019-03-26 DIAGNOSIS — M5442 Lumbago with sciatica, left side: Secondary | ICD-10-CM | POA: Diagnosis not present

## 2019-03-26 LAB — CBC WITH DIFFERENTIAL/PLATELET
Abs Immature Granulocytes: 0.07 10*3/uL (ref 0.00–0.07)
Basophils Absolute: 0 10*3/uL (ref 0.0–0.1)
Basophils Relative: 0 %
Eosinophils Absolute: 0.2 10*3/uL (ref 0.0–0.5)
Eosinophils Relative: 1 %
HCT: 36.6 % (ref 36.0–46.0)
Hemoglobin: 10.6 g/dL — ABNORMAL LOW (ref 12.0–15.0)
Immature Granulocytes: 1 %
Lymphocytes Relative: 16 %
Lymphs Abs: 1.7 10*3/uL (ref 0.7–4.0)
MCH: 27 pg (ref 26.0–34.0)
MCHC: 29 g/dL — ABNORMAL LOW (ref 30.0–36.0)
MCV: 93.4 fL (ref 80.0–100.0)
Monocytes Absolute: 0.9 10*3/uL (ref 0.1–1.0)
Monocytes Relative: 8 %
Neutro Abs: 8.1 10*3/uL — ABNORMAL HIGH (ref 1.7–7.7)
Neutrophils Relative %: 74 %
Platelets: 275 10*3/uL (ref 150–400)
RBC: 3.92 MIL/uL (ref 3.87–5.11)
RDW: 19.7 % — ABNORMAL HIGH (ref 11.5–15.5)
WBC: 11 10*3/uL — ABNORMAL HIGH (ref 4.0–10.5)
nRBC: 0 % (ref 0.0–0.2)

## 2019-03-26 LAB — COMPREHENSIVE METABOLIC PANEL
ALT: 36 U/L (ref 0–44)
AST: 32 U/L (ref 15–41)
Albumin: 3.3 g/dL — ABNORMAL LOW (ref 3.5–5.0)
Alkaline Phosphatase: 89 U/L (ref 38–126)
Anion gap: 9 (ref 5–15)
BUN: 11 mg/dL (ref 8–23)
CO2: 26 mmol/L (ref 22–32)
Calcium: 8.6 mg/dL — ABNORMAL LOW (ref 8.9–10.3)
Chloride: 101 mmol/L (ref 98–111)
Creatinine, Ser: 0.84 mg/dL (ref 0.44–1.00)
GFR calc Af Amer: >60 mL/min (ref >60)
GFR calc non Af Amer: >60 mL/min (ref >60)
Glucose, Bld: 149 mg/dL — ABNORMAL HIGH (ref 70–99)
Potassium: 3.5 mmol/L (ref 3.5–5.1)
Sodium: 136 mmol/L (ref 135–145)
Total Bilirubin: 0.4 mg/dL (ref 0.3–1.2)
Total Protein: 7.9 g/dL (ref 6.5–8.1)

## 2019-03-26 MED ORDER — SODIUM CHLORIDE 0.9% FLUSH
2.0000 mL | Freq: Once | INTRAVENOUS | Status: AC
  Administered 2019-03-26: 14:00:00 2 mL

## 2019-03-26 MED ORDER — TOPOTECAN HCL CHEMO INJECTION 4 MG
1.3000 mg/m2 | Freq: Once | INTRAVENOUS | Status: AC
  Administered 2019-03-26: 11:00:00 2.7 mg via INTRAVENOUS
  Filled 2019-03-26: qty 2.7

## 2019-03-26 MED ORDER — SODIUM CHLORIDE 0.9 % IV SOLN
Freq: Once | INTRAVENOUS | Status: AC
  Filled 2019-03-26: qty 250

## 2019-03-26 MED ORDER — HEPARIN SOD (PORK) LOCK FLUSH 100 UNIT/ML IV SOLN
INTRAVENOUS | Status: AC
  Filled 2019-03-26: qty 5

## 2019-03-26 MED ORDER — HEPARIN SOD (PORK) LOCK FLUSH 100 UNIT/ML IV SOLN
500.0000 [IU] | Freq: Once | INTRAVENOUS | Status: AC
  Administered 2019-03-26: 500 [IU] via INTRAVENOUS
  Filled 2019-03-26: qty 5

## 2019-03-26 MED ORDER — PROCHLORPERAZINE MALEATE 10 MG PO TABS
10.0000 mg | ORAL_TABLET | Freq: Once | ORAL | Status: AC
  Administered 2019-03-26: 10 mg via ORAL
  Filled 2019-03-26: qty 1

## 2019-03-26 MED ORDER — IOHEXOL 180 MG/ML  SOLN
10.0000 mL | Freq: Once | INTRAMUSCULAR | Status: AC
  Administered 2019-03-26: 10 mL via EPIDURAL
  Filled 2019-03-26: qty 20

## 2019-03-26 MED ORDER — MORPHINE SULFATE ER 30 MG PO TBCR: 30.0000 mg | EXTENDED_RELEASE_TABLET | Freq: Three times a day (TID) | ORAL | 0 refills | Status: DC

## 2019-03-26 MED ORDER — TRIAMCINOLONE ACETONIDE 40 MG/ML IJ SUSP
40.0000 mg | Freq: Once | INTRAMUSCULAR | Status: AC
  Administered 2019-03-26: 14:00:00 40 mg
  Filled 2019-03-26: qty 1

## 2019-03-26 MED ORDER — SODIUM CHLORIDE 0.9% FLUSH
10.0000 mL | INTRAVENOUS | Status: DC | PRN
  Administered 2019-03-26: 10 mL via INTRAVENOUS
  Filled 2019-03-26: qty 10

## 2019-03-26 MED ORDER — LIDOCAINE HCL 2 % IJ SOLN
20.0000 mL | Freq: Once | INTRAMUSCULAR | Status: AC
  Administered 2019-03-26: 400 mg
  Filled 2019-03-26: qty 40

## 2019-03-26 MED ORDER — ROPIVACAINE HCL 2 MG/ML IJ SOLN
2.0000 mL | Freq: Once | INTRAMUSCULAR | Status: AC
  Administered 2019-03-26: 14:00:00 2 mL via EPIDURAL
  Filled 2019-03-26: qty 10

## 2019-03-26 NOTE — Progress Notes (Signed)
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  States started gabapentin 3 times a day.

## 2019-03-26 NOTE — Progress Notes (Signed)
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  INTERVAL HISTORY: Patient returns to clinic today for further evaluation, discussion of her imaging results, and consideration of cycle 17 of monthly topotecan.  She missed day 1 of treatment yesterday secondary to worsening neuropathy.  She otherwise has felt well.  She continues to have chronic weakness and fatigue.  Her pain and nausea are well controlled on her current medications. She has no other neurologic complaints.  She denies any recent fevers or illnesses.  She has a good appetite and denies weight loss.  She has chronic shortness of breath, but denies any chest pain, cough, or hemoptysis. She has no abdominal pain.  She denies any vomiting, constipation, or diarrhea.  She denies any melena or hematochezia.  She has no urinary complaints.  Patient offers no further specific complaints today.  REVIEW OF SYSTEMS:   Review of Systems  Constitutional: Positive for malaise/fatigue. Negative for fever and weight loss.  Respiratory: Negative.  Negative for cough, hemoptysis, shortness of breath and wheezing.   Cardiovascular: Negative.  Negative for chest pain and leg swelling.  Gastrointestinal: Negative for abdominal pain, blood in stool, diarrhea, melena, nausea and vomiting.  Genitourinary: Negative.  Negative for dysuria and frequency.  Musculoskeletal: Positive for back pain, joint pain and neck pain.  Skin: Negative.  Negative for rash.  Neurological: Positive for sensory change and weakness. Negative for focal weakness and headaches.  Psychiatric/Behavioral: Negative.  Negative for  memory loss. The patient is not nervous/anxious.     As per HPI. Otherwise, a complete review of systems is negative.  PAST MEDICAL HISTORY: Past Medical History:  Diagnosis Date  . Anxiety   . Arthritis   . Asthma    COPD  . CAP (community acquired pneumonia) 06/30/2014  . Cardiomyopathy ([place])    nonischemic (EF 35-40%)  . CHF (congestive heart failure) ([place])   . COPD (chronic obstructive pulmonary disease) ([place])   . Depression   . Dyspnea   . GERD (gastroesophageal reflux disease)   . Hyperlipidemia   . Hypertension   . Pneumonia   . PVC's (premature ventricular contractions)    states 10 years ago  . Sepsis ([place]) 06/30/2014  . Small cell lung cancer ([place]) 12/2016   Chemo tx's.  . SOB (shortness of breath) 05/08/2014  . Spinal cord injury at T7-T12 level [facility] [place])    2016    PAST SURGICAL HISTORY: Past Surgical History:  Procedure Laterality Date  . APPENDECTOMY    . CARDIAC CATHETERIZATION    . CATARACT EXTRACTION     right eye   . CESAREAN SECTION    . COLONOSCOPY    . LAMINOTOMY    . LUMBAR LAMINECTOMY/DECOMPRESSION MICRODISCECTOMY Left 08/01/2016   Procedure: Left Lumbar Four-Five Laminectomy and Foraminotomy;  Surgeon: Earnie Larsson, MD;  Location: [facility];  Service: Neurosurgery;  Laterality: Left;  . PORTA CATH INSERTION N/A 02/03/2017   Procedure: PORTA CATH INSERTION;  Surgeon: Katha Cabal, MD;  Location: [facility];  Service: Cardiovascular;  Laterality: N/A;    FAMILY HISTORY: Family History  Problem Relation Age of Onset  . Heart attack Father 95  MI x 3     ADVANCED DIRECTIVES (Y/N):  N  HEALTH MAINTENANCE: Social History   Tobacco Use  . Smoking status: Former Smoker    Packs/day: 0.50    Years: 50.00    Pack years: 25.00    Types: Cigarettes    Quit date: 01/27/2015    Years since quitting: 4.1  . Smokeless tobacco: Never Used  Substance Use Topics  . Alcohol use: No  . Drug use: No     Colonoscopy:  PAP:  Bone  density:  Lipid panel:  Allergies  Allergen Reactions  . Fish Allergy Anaphylaxis    Per pt after eating [facility] she had throat swelling and SOB. MSY  Per pt after eating [facility] she had throat swelling and SOB. MSY   . Fentanyl     Overly sedated and groggy, ineffective     Current Outpatient Medications  Medication Sig Dispense Refill  . ADVAIR DISKUS 250-50 MCG/DOSE AEPB Inhale 1 puff into the lungs 2 (two) times daily.    Marland Kitchen albuterol (PROAIR HFA) 108 (90 Base) MCG/ACT inhaler Inhale 2 puffs into the lungs every 6 (six) hours as needed for wheezing or shortness of breath.     Marland Kitchen alendronate (FOSAMAX) 70 MG tablet Take 70 mg by mouth every Thursday.     . ARIPiprazole (ABILIFY) 5 MG tablet Take 5 mg by mouth daily.    Marland Kitchen dextromethorphan-guaiFENesin (MUCINEX DM) 30-600 MG 12hr tablet Take 1 tablet by mouth 2 (two) times daily as needed for cough.    . DULoxetine (CYMBALTA) 60 MG capsule Take 60 mg by mouth daily.    Marland Kitchen gabapentin (NEURONTIN) 100 MG capsule Take 100 mg by mouth 3 (three) times daily.    Marland Kitchen ipratropium-albuterol (DUONEB) 0.5-2.5 (3) MG/3ML SOLN Take 3 mLs by nebulization every 4 (four) hours as needed. 360 mL 1  . LORazepam (ATIVAN) 0.5 MG tablet Take 0.5 mg by mouth as needed.    . metoprolol succinate (TOPROL-XL) 100 MG 24 hr tablet Take 1 tablet by mouth 1 day or 1 dose.    . morphine (MS CONTIN) 30 MG 12 hr tablet Take 1 tablet (30 mg total) by mouth every 8 (eight) hours. 90 tablet 0  . pantoprazole (PROTONIX) 40 MG tablet Take 1 tablet by mouth daily.    . potassium chloride SA (K-DUR,KLOR-CON) 20 MEQ tablet Take 1 tablet (20 mEq total) by mouth daily. 30 tablet 2  . prochlorperazine (COMPAZINE) 10 MG tablet TAKE 1 TABLET (10 MG TOTAL) BY MOUTH EVERY 6 (SIX) HOURS AS NEEDED (NAUSEA OR VOMITING). 60 tablet 2  . promethazine (PHENERGAN) 25 MG tablet Take 1 tablet (25 mg total) by mouth every 8 (eight) hours as needed for nausea or vomiting. 30 tablet 2  .  sacubitril-valsartan (ENTRESTO) 24-26 MG Take 1 tablet by mouth 2 (two) times daily. (Patient taking differently: Take 1 tablet by mouth daily. Pt taking 5mg ) 180 tablet 3  . varenicline (CHANTIX) 1 MG tablet Take 1 mg by mouth 2 (two) times daily.    Marland Kitchen tiZANidine (ZANAFLEX) 4 MG tablet Take 1 tablet (4 mg total) by mouth every 8 (eight) hours as needed for muscle spasms. (Patient not taking: Reported on 03/26/2019) 90 tablet 2   No current facility-administered medications for this visit.   Facility-Administered Medications Ordered in Other Visits  Medication Dose Route Frequency Provider Last Rate Last Admin  . sodium chloride flush (NS) 0.9 % injection 10 mL  10 mL Intravenous PRN  Lloyd Huger, MD   10 mL at 03/01/17 0841    OBJECTIVE: Vitals:   03/26/19 0903  BP: 109/75  Pulse: (!) 58  Resp: 16  Temp: 98 F (36.7 C)     Body mass index is 34.28 kg/m.    ECOG FS:1 - Symptomatic but completely ambulatory  General: Well-developed, well-nourished, no acute distress.  Sitting in a wheelchair. Eyes: Pink conjunctiva, anicteric sclera. HEENT: Normocephalic, moist mucous membranes. Lungs: No audible wheezing or coughing. Heart: Regular rate and rhythm. Abdomen: Soft, nontender, no obvious distention. Musculoskeletal: No edema, cyanosis, or clubbing. Neuro: Alert, answering all questions appropriately. Cranial nerves grossly intact. Skin: No rashes or petechiae noted. Psych: Normal affect.   LAB RESULTS:  Lab Results  Component Value Date   NA 136 03/26/2019   K 3.5 03/26/2019   CL 101 03/26/2019   CO2 26 03/26/2019   GLUCOSE 149 (H) 03/26/2019   BUN 11 03/26/2019   CREATININE 0.84 03/26/2019   CALCIUM 8.6 (L) 03/26/2019   PROT 7.9 03/26/2019   ALBUMIN 3.3 (L) 03/26/2019   AST 32 03/26/2019   ALT 36 03/26/2019   ALKPHOS 89 03/26/2019   BILITOT 0.4 03/26/2019   GFRNONAA >60 03/26/2019   GFRAA >60 03/26/2019    Lab Results  Component Value Date   WBC 11.0 (H)  03/26/2019   NEUTROABS 8.1 (H) 03/26/2019   HGB 10.6 (L) 03/26/2019   HCT 36.6 03/26/2019   MCV 93.4 03/26/2019   PLT 275 03/26/2019     STUDIES: CT Chest W Contrast  Result Date: 03/22/2019 CLINICAL DATA:  Small cell lung cancer restaging EXAM: CT CHEST, ABDOMEN, AND PELVIS WITH CONTRAST TECHNIQUE: Multidetector CT imaging of the chest, abdomen and pelvis was performed following the standard protocol during bolus administration of intravenous contrast. CONTRAST:  110mL OMNIPAQUE IOHEXOL 300 MG/ML  SOLN COMPARISON:  Multiple exams, including 12/26/2018 FINDINGS: CT CHEST FINDINGS Cardiovascular: Right Port-A-Cath tip: SVC. Coronary, aortic arch, and branch vessel atherosclerotic vascular disease. Mediastinum/Nodes: Right hilar node 0.9 cm in short axis, stable by my measurement. Anterior pericardial node 0.8 cm in short axis on image 41/2, previously 0.4 cm. Lungs/Pleura: Left upper lobe mass observed with adjacent postobstructive atelectasis. The mass measures 2.5 cm in short axis, previously the same. The long axis measurement is more difficult to estimate due to the atelectasis obscuring the margin, but approximately 2.8 cm on image 20/2, which is stable. Centrilobular emphysema. Mild scarring posteriorly in the right upper lobe. Part solid 1.1 by 0.6 cm right lower lobe nodule on image 76/3, by my measurements previously 1.2 by 0.6 cm. Stable right lower lobe and right middle lobe scarring. Left lower lobe 0.7 by 0.5 cm subpleural nodule on image 95/3, stable. 3 mm subpleural nodule in the left upper lobe adjacent to the mediastinum on image 60/3, stable. Musculoskeletal: Scattered multifocal bony sclerosis in the thorax compatible with previous osseous metastatic disease. No significant change distribution. T5 vertebra plana. Wedging at T4 and T7, unchanged. Chronic mild superior endplate compression at L1. CT ABDOMEN PELVIS FINDINGS Hepatobiliary: Rim enhancing lesion in segment 4 of the liver  adjacent to the falciform ligament demonstrates increased central hypodensity and measures about 3.0 by 2.7 cm, previously 2.2 by 2.1 cm. Rim enhancing lesion in the lateral segment left hepatic lobe measures 2.0 by 1.8 cm, previously 2.1 by 2.1. Contracted gallbladder. Stable mild extrahepatic biliary prominence with the common bile duct at about 0.9 cm. Pancreas: Unremarkable Spleen: Unremarkable Adrenals/Urinary Tract: Mild scarring in the  left kidney upper pole. Adrenal glands unremarkable. Urinary bladder unremarkable. Stomach/Bowel: Mild sigmoid colon diverticulosis. Prominent stool throughout the colon favors constipation. Vascular/Lymphatic: Aortoiliac atherosclerotic vascular disease. No pathologic adenopathy is identified. Reproductive: No supplemental non-categorized findings. Other: No supplemental non-categorized findings. Musculoskeletal: 1.1 cm of grade 2 anterolisthesis of L4 on L5 associated with a chronic right pars defect and degenerative facet arthropathy, resulting in bilateral foraminal impingement and central narrowing of the thecal sac this level. Stable scattered sclerotic osseous metastatic disease. IMPRESSION: 1. Stable size of the left upper lobe mass with adjacent postobstructive atelectasis. The other smaller lung nodules are stable. 2. Enlarging hypodense metastatic lesion in segment 4 of the liver. On the other hand, the lateral segment left hepatic lobe metastatic lesion appears mildly smaller. 3. Stable scattered sclerotic osseous metastatic disease. 4. A anterior pericardial lymph node has enlarged compared to prior, currently 0.8 cm in short axis. 5. Other imaging findings of potential clinical significance: Coronary atherosclerosis. Prominent stool throughout the colon favors constipation. Mild sigmoid colon diverticulosis. Grade 2 anterolisthesis of L4 on L5 associated with a chronic right pars defect and degenerative facet arthropathy, resulting in bilateral foraminal  impingement and central narrowing of the thecal sac. Compression fractures in the thoracic and upper lumbar spine as before, including vertebra plana at the T5 level. Aortic Atherosclerosis (ICD10-I70.0) and Emphysema (ICD10-J43.9). Electronically Signed   By: Van Clines M.D.   On: 03/22/2019 11:18   CT Abdomen Pelvis W Contrast  Result Date: 03/22/2019 CLINICAL DATA:  Small cell lung cancer restaging EXAM: CT CHEST, ABDOMEN, AND PELVIS WITH CONTRAST TECHNIQUE: Multidetector CT imaging of the chest, abdomen and pelvis was performed following the standard protocol during bolus administration of intravenous contrast. CONTRAST:  156mL OMNIPAQUE IOHEXOL 300 MG/ML  SOLN COMPARISON:  Multiple exams, including 12/26/2018 FINDINGS: CT CHEST FINDINGS Cardiovascular: Right Port-A-Cath tip: SVC. Coronary, aortic arch, and branch vessel atherosclerotic vascular disease. Mediastinum/Nodes: Right hilar node 0.9 cm in short axis, stable by my measurement. Anterior pericardial node 0.8 cm in short axis on image 41/2, previously 0.4 cm. Lungs/Pleura: Left upper lobe mass observed with adjacent postobstructive atelectasis. The mass measures 2.5 cm in short axis, previously the same. The long axis measurement is more difficult to estimate due to the atelectasis obscuring the margin, but approximately 2.8 cm on image 20/2, which is stable. Centrilobular emphysema. Mild scarring posteriorly in the right upper lobe. Part solid 1.1 by 0.6 cm right lower lobe nodule on image 76/3, by my measurements previously 1.2 by 0.6 cm. Stable right lower lobe and right middle lobe scarring. Left lower lobe 0.7 by 0.5 cm subpleural nodule on image 95/3, stable. 3 mm subpleural nodule in the left upper lobe adjacent to the mediastinum on image 60/3, stable. Musculoskeletal: Scattered multifocal bony sclerosis in the thorax compatible with previous osseous metastatic disease. No significant change distribution. T5 vertebra plana. Wedging at  T4 and T7, unchanged. Chronic mild superior endplate compression at L1. CT ABDOMEN PELVIS FINDINGS Hepatobiliary: Rim enhancing lesion in segment 4 of the liver adjacent to the falciform ligament demonstrates increased central hypodensity and measures about 3.0 by 2.7 cm, previously 2.2 by 2.1 cm. Rim enhancing lesion in the lateral segment left hepatic lobe measures 2.0 by 1.8 cm, previously 2.1 by 2.1. Contracted gallbladder. Stable mild extrahepatic biliary prominence with the common bile duct at about 0.9 cm. Pancreas: Unremarkable Spleen: Unremarkable Adrenals/Urinary Tract: Mild scarring in the left kidney upper pole. Adrenal glands unremarkable. Urinary bladder unremarkable. Stomach/Bowel: Mild sigmoid colon  diverticulosis. Prominent stool throughout the colon favors constipation. Vascular/Lymphatic: Aortoiliac atherosclerotic vascular disease. No pathologic adenopathy is identified. Reproductive: No supplemental non-categorized findings. Other: No supplemental non-categorized findings. Musculoskeletal: 1.1 cm of grade 2 anterolisthesis of L4 on L5 associated with a chronic right pars defect and degenerative facet arthropathy, resulting in bilateral foraminal impingement and central narrowing of the thecal sac this level. Stable scattered sclerotic osseous metastatic disease. IMPRESSION: 1. Stable size of the left upper lobe mass with adjacent postobstructive atelectasis. The other smaller lung nodules are stable. 2. Enlarging hypodense metastatic lesion in segment 4 of the liver. On the other hand, the lateral segment left hepatic lobe metastatic lesion appears mildly smaller. 3. Stable scattered sclerotic osseous metastatic disease. 4. A anterior pericardial lymph node has enlarged compared to prior, currently 0.8 cm in short axis. 5. Other imaging findings of potential clinical significance: Coronary atherosclerosis. Prominent stool throughout the colon favors constipation. Mild sigmoid colon  diverticulosis. Grade 2 anterolisthesis of L4 on L5 associated with a chronic right pars defect and degenerative facet arthropathy, resulting in bilateral foraminal impingement and central narrowing of the thecal sac. Compression fractures in the thoracic and upper lumbar spine as before, including vertebra plana at the T5 level. Aortic Atherosclerosis (ICD10-I70.0) and Emphysema (ICD10-J43.9). Electronically Signed   By: Van Clines M.D.   On: 03/22/2019 11:18   DG PAIN CLINIC C-ARM 1-60 MIN NO REPORT  Result Date: 03/26/2019 Fluoro was used, but no Radiologist interpretation will be provided. Please refer to "NOTES" tab for provider progress note.  Fluoro (C-Arm) (<60 min) (No Report)  Result Date: 02/26/2019 Fluoro was used, but no Radiologist interpretation will be provided. Please refer to "NOTES" tab for provider progress note.   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  PLAN:  1. 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  Patient then received maintenance Tecentriq from Jun 21, 2017 through November 15, 2017.  CT scan results from December 26, 2018 reviewed independently with essentially stable disease.  Previously, hospice was discussed, patient wished to pursue aggressive treatment.  She was initially scheduled to get topotecan on days 1 through 5 every 21 days, but given her persistent thrombocytopenia she can only receive treatment every 28 days.  CT scan results from March 22, 2019 reviewed independently and reported as above with one of her dominant liver lesions progressing, but otherwise stable or decreased disease elsewhere.  Plan to discuss with interventional radiology if embolization or ablation is possible for this 1 area.  In the meantime, will proceed with cycle 17, day 2 of dose reduced  topotecan today.  Patient missed day 1 secondary to worsening neuropathy.  Return to clinic Wednesday through Friday for topotecan only.  Return to clinic in 4 weeks for further evaluation and consideration of cycle 18. 2. Pain: Chronic and unchanged.  Continue MS Contin to 30 mg every 8 hours.  Patient was given a refill today.  She also has an appointment in pain clinic today for a spinal injection. 3. Thrombocytopenia: Resolved.  Proceed with treatment every 28 days as above. 4.  Anemia: Hemoglobin improved to 10.6. 5.  COPD: Chronic and unchanged.  Continue outpatient palliative care.  Continue current medications as prescribed. 6.  Hypokalemia: Resolved.  Continue oral potassium supplementation. 7.  Disposition: Hospice and end-of-life care were previously discussed, but  patient wishes to continue aggressive treatments.  She expressed understanding that her treatment options are limited.  She confirms that she is a DNR and is having outpatient palliative care visit. 8.  Tobacco use: Patient states she recently started smoking again.  She expressed understanding that this may increase her risk of recurrence.  She also recently started Chantix in order to quit again.   Patient expressed understanding and was in agreement with this plan. She also understands that She can call clinic at any time with any questions, concerns, or complaints.   Cancer Staging Small cell lung cancer [facility]) Staging form: Lung, AJCC 8th Edition - Clinical stage from 01/28/2017: Stage IV (cT4, cN3, pM1c) - Signed by Lloyd Huger, MD on 01/28/2017   Lloyd Huger, MD   03/26/2019 1:51 PM

## 2019-03-26 NOTE — Progress Notes (Signed)
Safety precautions to be maintained throughout the outpatient stay will include: orient to surroundings, keep bed in low position, maintain call bell within reach at all times, provide assistance with transfer out of bed and ambulation.    Patient had chemo today

## 2019-03-26 NOTE — Progress Notes (Signed)
PROVIDER NOTE: Information contained herein reflects review and annotations entered in association with encounter. Interpretation of such information and data should be left to medically-trained personnel. Information provided to patient can be located elsewhere in the medical record under "Patient Instructions". Document created using STT-dictation technology, any transcriptional errors that may result from process are unintentional.    Patient: Grace [name]  Service Category: Procedure  Provider: Gaspar Cola, MD  DOB: [birth year]  DOS: 03/26/2019  Location: [facility]  MRN: [id number]  Setting: Ambulatory - outpatient  Referring Provider: Milinda Pointer, MD  Type: Established Patient  Specialty: Interventional Pain Management  PCP: [name], MD   Primary Reason for Visit: Interventional Pain Management Treatment. CC: Back Pain (lumbar bilateral, right is worse )  Procedure:          Anesthesia, Analgesia, Anxiolysis:  Type: Diagnostic Inter-Laminar Epidural Steroid Injection  #1  Region: Lumbar Level: L4-5 Level. Laterality: Left-Sided Paramedial  Type: Local Anesthesia Indication(s): Analgesia         Route: Infiltration ([place]/IM) IV Access: Declined Sedation: Declined  Local Anesthetic: Lidocaine 1-2%  Position: Prone with head of the table was raised to facilitate breathing.   Indications: 1. DDD (degenerative disc disease), lumbosacral   2. 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 Lumbar stenosis with neurogenic claudication   4. Chronic lower extremity pain (Left)   5. Lumbar radiculitis (Left)    Pain Score: Pre-procedure: 0-No pain/10 Post-procedure: 1 /10   Pre-op Assessment:  Ms. Bessey is a 69 y.o. (year old), female patient, seen today for interventional treatment. She  has a past surgical history that includes Cardiac catheterization; Cesarean section; Appendectomy; Cataract extraction; Colonoscopy;  Lumbar laminectomy/decompression microdiscectomy (Left, 08/01/2016); Laminotomy; and PORTA CATH INSERTION (N/A, 02/03/2017). Ms. Agnes has a current medication list which includes the following prescription(s): advair diskus, albuterol, alendronate, aripiprazole, dextromethorphan-guaifenesin, duloxetine, gabapentin, ipratropium-albuterol, lorazepam, metoprolol succinate, morphine, pantoprazole, potassium chloride sa, prochlorperazine, promethazine, sacubitril-valsartan, varenicline, and tizanidine, and the following Facility-Administered Medications: sodium chloride flush. Her primarily concern today is the Back Pain (lumbar bilateral, right is worse )  Initial Vital Signs:  Pulse/HCG Rate: 79ECG Heart Rate: 86 Temp: (!) 97.2 F (36.2 C) Resp: 16 BP: 102/71 SpO2: 96 %  BMI: Estimated body mass index is 34.28 kg/m as calculated from the following:   Height as of this encounter: 5\' 5"  (1.651 m).   Weight as of this encounter: 206 lb (93.4 kg).  Risk Assessment: Allergies: Reviewed. She is allergic to fish allergy and fentanyl.  Allergy Precautions: None required Coagulopathies: Reviewed. None identified.  Blood-thinner therapy: None at this time Active Infection(s): Reviewed. None identified. Ms. Hannen is afebrile  Site Confirmation: Ms. Compere was asked to confirm the procedure and laterality before marking the site Procedure checklist: Completed Consent: Before the procedure and under the influence of no sedative(s), amnesic(s), or anxiolytics, the patient was informed of the treatment options, risks and possible complications. To fulfill our ethical and legal obligations, as recommended by the American Medical Association's Code of Ethics, I have informed the patient of my clinical impression; the nature and purpose of the treatment or procedure; the risks, benefits, and possible complications of the intervention; the alternatives, including doing nothing; the risk(s) and benefit(s) of  the alternative treatment(s) or procedure(s); and the risk(s) and benefit(s) of doing nothing. The patient was provided information about the general risks and possible complications associated with the procedure. These may include, but are not  limited to: failure to achieve desired goals, infection, bleeding, organ or nerve damage, allergic reactions, paralysis, and death. In addition, the patient was informed of those risks and complications associated to Spine-related procedures, such as failure to decrease pain; infection (i.e.: Meningitis, epidural or intraspinal abscess); bleeding (i.e.: epidural hematoma, subarachnoid hemorrhage, or any other type of intraspinal or peri-dural bleeding); organ or nerve damage (i.e.: Any type of peripheral nerve, nerve root, or spinal cord injury) with subsequent damage to sensory, motor, and/or autonomic systems, resulting in permanent pain, numbness, and/or weakness of one or several areas of the body; allergic reactions; (i.e.: anaphylactic reaction); and/or death. Furthermore, the patient was informed of those risks and complications associated with the medications. These include, but are not limited to: allergic reactions (i.e.: anaphylactic or anaphylactoid reaction(s)); adrenal axis suppression; blood sugar elevation that in diabetics may result in ketoacidosis or comma; water retention that in patients with history of congestive heart failure may result in shortness of breath, pulmonary edema, and decompensation with resultant heart failure; weight gain; swelling or edema; medication-induced neural toxicity; particulate matter embolism and blood vessel occlusion with resultant organ, and/or nervous system infarction; and/or aseptic necrosis of one or more joints. Finally, the patient was informed that Medicine is not an exact science; therefore, there is also the possibility of unforeseen or unpredictable risks and/or possible complications that may result in a  catastrophic outcome. The patient indicated having understood very clearly. We have given the patient no guarantees and we have made no promises. Enough time was given to the patient to ask questions, all of which were answered to the patient's satisfaction. Ms. Bellantoni has indicated that she wanted to continue with the procedure. Attestation: I, the ordering provider, attest that I have discussed with the patient the benefits, risks, side-effects, alternatives, likelihood of achieving goals, and potential problems during recovery for the procedure that I have provided informed consent. Date  Time: 03/26/2019  1:00 PM  Pre-Procedure Preparation:  Monitoring: As per clinic protocol. Respiration, ETCO2, SpO2, BP, heart rate and rhythm monitor placed and checked for adequate function Safety Precautions: Patient was assessed for positional comfort and pressure points before starting the procedure. Time-out: I initiated and conducted the "Time-out" before starting the procedure, as per protocol. The patient was asked to participate by confirming the accuracy of the "Time Out" information. Verification of the correct person, site, and procedure were performed and confirmed by me, the nursing staff, and the patient. "Time-out" conducted as per Joint Commission's Universal Protocol (UP.01.01.01). Time: 1342  Description of Procedure:          Target Area: The interlaminar space, initially targeting the lower laminar border of the superior vertebral body. Approach: Paramedial approach. Area Prepped: Entire Posterior Lumbar Region Prepping solution: DuraPrep (Iodine Povacrylex [0.7% available iodine] and Isopropyl Alcohol, 74% w/w) Safety Precautions: Aspiration looking for blood return was conducted prior to all injections. At no point did we inject any substances, as a needle was being advanced. No attempts were made at seeking any paresthesias. Safe injection practices and needle disposal techniques used.  Medications properly checked for expiration dates. SDV (single dose vial) medications used. Description of the Procedure: Protocol guidelines were followed. The procedure needle was introduced through the skin, ipsilateral to the reported pain, and advanced to the target area. Bone was contacted and the needle walked caudad, until the lamina was cleared. The epidural space was identified using "loss-of-resistance technique" with 2-3 ml of PF-NaCl (0.9% NSS), in a 5cc LOR glass  syringe.  Vitals:   03/26/19 1257 03/26/19 1341 03/26/19 1346 03/26/19 1349  BP: 102/71 109/81 125/80 132/70  Pulse: 79     Resp: 16 16 17 16   Temp: (!) 97.2 F (36.2 C)     TempSrc: Temporal     SpO2: 96% 100% 100% 100%  Weight: 206 lb (93.4 kg)     Height: 5\' 5"  (1.651 m)       Start Time: 1342 hrs. End Time: 1349 hrs.  Materials:  Needle(s) Type: Epidural needle Gauge: 17G Length: 3.5-in Medication(s): Please see orders for medications and dosing details.  Imaging Guidance (Spinal):          Type of Imaging Technique: Fluoroscopy Guidance (Spinal) Indication(s): Assistance in needle guidance and placement for procedures requiring needle placement in or near specific anatomical locations not easily accessible without such assistance. Exposure Time: Please see nurses notes. Contrast: Before injecting any contrast, we confirmed that the patient did not have an allergy to iodine, shellfish, or radiological contrast. Once satisfactory needle placement was completed at the desired level, radiological contrast was injected. Contrast injected under live fluoroscopy. No contrast complications. See chart for type and volume of contrast used. Fluoroscopic Guidance: I was personally present during the use of fluoroscopy. "Tunnel Vision Technique" used to obtain the best possible view of the target area. Parallax error corrected before commencing the procedure. "Direction-depth-direction" technique used to introduce the  needle under continuous pulsed fluoroscopy. Once target was reached, antero-posterior, oblique, and lateral fluoroscopic projection used confirm needle placement in all planes. Images permanently stored in EMR. Interpretation: I personally interpreted the imaging intraoperatively. Adequate needle placement confirmed in multiple planes. Appropriate spread of contrast into desired area was observed. No evidence of afferent or efferent intravascular uptake. No intrathecal or subarachnoid spread observed. Permanent images saved into the patient's record.  Antibiotic Prophylaxis:   Anti-infectives (From admission, onward)   None     Indication(s): None identified  Post-operative Assessment:  Post-procedure Vital Signs:  Pulse/HCG Rate: 7983 Temp: (!) 97.2 F (36.2 C) Resp: 16 BP: 132/70 SpO2: 100 %  EBL: None  Complications: No immediate post-treatment complications observed by team, or reported by patient.  Note: The patient tolerated the entire procedure well. A repeat set of vitals were taken after the procedure and the patient was kept under observation following institutional policy, for this type of procedure. Post-procedural neurological assessment was performed, showing return to baseline, prior to discharge. The patient was provided with post-procedure discharge instructions, including a section on how to identify potential problems. Should any problems arise concerning this procedure, the patient was given instructions to immediately contact us, at any time, without hesitation. In any case, we plan to contact the patient by telephone for a follow-up status report regarding this interventional procedure.  Comments:  No additional relevant information.  Plan of Care  Orders:  Orders Placed This Encounter  Procedures  . Lumbar Epidural Injection    Scheduling Instructions:     Procedure: Interlaminar LESI L4-5     Laterality: Left-sided     Sedation: No Sedation     Timeframe:   Today    Order Specific Question:   Where will this procedure be performed?    Answer:   [facility]  . [facility] C-ARM 1-60 MIN NO REPORT    Intraoperative interpretation by procedural physician at [place].    Standing Status:   Standing    Number of Occurrences:   1  Order Specific Question:   Reason for exam:    Answer:   Assistance in needle guidance and placement for procedures requiring needle placement in or near specific anatomical locations not easily accessible without such assistance.  . Informed Consent Details: Physician/Practitioner Attestation; Transcribe to consent form and obtain patient signature    Provider Attestation: I, [place] Dossie Arbour, MD, (Pain Management Specialist), the physician/practitioner, attest that I have discussed with the patient the benefits, risks, side effects, alternatives, likelihood of achieving goals and potential problems during recovery for the procedure that I have provided informed consent.    Scheduling Instructions:     Procedure: Lumbar epidural steroid injection under fluoroscopic guidance     Indications: Low back and/or lower extremity pain secondary to lumbar radiculitis     Note: Always confirm laterality of pain with Ms. Lux, before procedure.     Transcribe to consent form and obtain patient signature.  . Provide equipment / supplies at bedside    Equipment required: Single use, disposable, "Epidural Tray" Epidural Catheter: NOT required    Standing Status:   Standing    Number of Occurrences:   1    Order Specific Question:   Specify    Answer:   Epidural Tray  . Monitor O2 SATs    Monitor O2 SATs during and after procedure. Administer supplemental O2 if saturation decreases with sedation.    Standing Status:   Standing    Number of Occurrences:   1  . Oxygen therapy Mode or (Route): Nasal cannula; FiO2: 21%; Liters Per Minute: 2; Keep 02 saturation: Above 92%    Keep patient on oxygen by [place].  Discontinue once pulse oxymetry is back to normal levels for patient.    Standing Status:   Standing    Number of Occurrences:   1    Order Specific Question:   Mode or (Route)    Answer:   Nasal cannula    Order Specific Question:   FiO2    Answer:   21%    Order Specific Question:   Liters Per Minute    Answer:   2    Order Specific Question:   Keep 02 saturation    Answer:   Above 92%   Chronic Opioid Analgesic:  No opioid analgesics prescribed by our practice.   Medications ordered for procedure: Meds ordered this encounter  Medications  . iohexol (OMNIPAQUE) 180 MG/ML injection 10 mL    Must be Myelogram-compatible. If not available, you may substitute with a water-soluble, non-ionic, hypoallergenic, myelogram-compatible radiological contrast medium.  Marland Kitchen lidocaine (XYLOCAINE) 2 % (with pres) injection 400 mg  . sodium chloride flush (NS) 0.9 % injection 2 mL  . ropivacaine (PF) 2 mg/mL (0.2%) (NAROPIN) injection 2 mL  . triamcinolone acetonide (KENALOG-40) injection 40 mg   Medications administered: We administered iohexol, lidocaine, sodium chloride flush, ropivacaine (PF) 2 mg/mL (0.2%), and triamcinolone acetonide.  See the medical record for exact dosing, route, and time of administration.  Follow-up plan:   Return in about 2 weeks (around 04/09/2019) for (VV), (PP).       Interventional management options: Planned, scheduled, and/or pending:      Considering:   Therapeutic bilateral IA knee joint injections w/ Hyalgan Diagnostic bilateral Genicular NB Possible bilateral Genicular nerve RFA Diagnostic left suprascapular NB Possible bilateral suprascapular nerve RFA   Palliative PRN treatment(s):   Palliative right medial epicondyle elbow injection #2  Palliative right IA knee injection #3  Palliative left IA knee  injection #4  Palliative right suprascapular NB #2  Palliative right IA shoulder (glenohumeral) #2  Palliative right acromioclavicular joint  injection #2  Palliative left IA shoulder (glenohumeral) #3  Palliative left acromioclavicular joint injection #2      Recent Visits Date Type Provider Dept  03/12/19 Telemedicine Milinda Pointer, MD [facility]  02/26/19 Procedure visit Milinda Pointer, MD [facility]  02/25/19 Telemedicine Milinda Pointer, MD [facility]  Showing recent visits within past 90 days and meeting all other requirements   Today's Visits Date Type Provider Dept  03/26/19 Procedure visit Milinda Pointer, MD [facility]  Showing today's visits and meeting all other requirements   Future Appointments Date Type Provider Dept  04/10/19 Appointment Milinda Pointer, MD [facility]  Showing future appointments within next 90 days and meeting all other requirements   Disposition: Discharge home  Discharge (Date  Time): 03/26/2019; 1400 hrs.   Primary Care Physician: [name], MD Location: [facility] Note by: Gaspar Cola, MD Date: 03/26/2019; Time: 2:11 PM  Disclaimer:  Medicine is not an Chief Strategy Officer. The only guarantee in medicine is that nothing is guaranteed. It is important to note that the decision to proceed with this intervention was based on the information collected from the patient. The Data and conclusions were drawn from the patient's questionnaire, the interview, and the physical examination. Because the information was provided in large part by the patient, it cannot be guaranteed that it has not been purposely or unconsciously manipulated. Every effort has been made to obtain as much relevant data as possible for this evaluation. It is important to note that the conclusions that lead to this procedure are derived in large part from the available data. Always take into account that the treatment will also be dependent on availability of resources and existing treatment guidelines, considered by  other Pain Management Practitioners as being common knowledge and practice, at the time of the intervention. For Medico-Legal purposes, it is also important to point out that variation in procedural techniques and pharmacological choices are the acceptable norm. The indications, contraindications, technique, and results of the above procedure should only be interpreted and judged by a Board-Certified Interventional Pain Specialist with extensive familiarity and expertise in the same exact procedure and technique.

## 2019-03-26 NOTE — Patient Instructions (Addendum)
____________________________________________________________________________________________  Post-Procedure Discharge Instructions  Instructions:  Apply ice:   Purpose: This will minimize any swelling and discomfort after procedure.   When: Day of procedure, as soon as you get home.  How: Fill a plastic sandwich bag with crushed ice. Cover it with a small towel and apply to injection site.  How long: (15 min on, 15 min off) Apply for 15 minutes then remove x 15 minutes.  Repeat sequence on day of procedure, until you go to bed.  Apply heat:   Purpose: To treat any soreness and discomfort from the procedure.  When: Starting the next day after the procedure.  How: Apply heat to procedure site starting the day following the procedure.  How long: May continue to repeat daily, until discomfort goes away.  Food intake: Start with clear liquids (like water) and advance to regular food, as tolerated.   Physical activities: Keep activities to a minimum for the first 8 hours after the procedure. After that, then as tolerated.  Driving: If you have received any sedation, be responsible and do not drive. You are not allowed to drive for 24 hours after having sedation.  Blood thinner: (Applies only to those taking blood thinners) You may restart your blood thinner 6 hours after your procedure.  Insulin: (Applies only to Diabetic patients taking insulin) As soon as you can eat, you may resume your normal dosing schedule.  Infection prevention: Keep procedure site clean and dry. Shower daily and clean area with soap and water.  Post-procedure Pain Diary: Extremely important that this be done correctly and accurately. Recorded information will be used to determine the next step in treatment. For the purpose of accuracy, follow these rules:  Evaluate only the area treated. Do not report or include pain from an untreated area. For the purpose of this evaluation, ignore all other areas of pain,  except for the treated area.  After your procedure, avoid taking a long nap and attempting to complete the pain diary after you wake up. Instead, set your alarm clock to go off every hour, on the hour, for the initial 8 hours after the procedure. Document the duration of the numbing medicine, and the relief you are getting from it.  Do not go to sleep and attempt to complete it later. It will not be accurate. If you received sedation, it is likely that you were given a medication that may cause amnesia. Because of this, completing the diary at a later time may cause the information to be inaccurate. This information is needed to plan your care.  Follow-up appointment: Keep your post-procedure follow-up evaluation appointment after the procedure (usually 2 weeks for most procedures, 6 weeks for radiofrequencies). DO NOT FORGET to bring you pain diary with you.   Expect: (What should I expect to see with my procedure?)  From numbing medicine (AKA: Local Anesthetics): Numbness or decrease in pain. You may also experience some weakness, which if present, could last for the duration of the local anesthetic.  Onset: Full effect within 15 minutes of injected.  Duration: It will depend on the type of local anesthetic used. On the average, 1 to 8 hours.   From steroids (Applies only if steroids were used): Decrease in swelling or inflammation. Once inflammation is improved, relief of the pain will follow.  Onset of benefits: Depends on the amount of swelling present. The more swelling, the longer it will take for the benefits to be seen. In some cases, up to 10 days.    Duration: Steroids will stay in the system x 2 weeks. Duration of benefits will depend on multiple posibilities including persistent irritating factors.  Side-effects: If present, they may typically last 2 weeks (the duration of the steroids).  Frequent: Cramps (if they occur, drink Gatorade and take over-the-counter Magnesium 450-500 mg  once to twice a day); water retention with temporary weight gain; increases in blood sugar; decreased immune system response; increased appetite.  Occasional: Facial flushing (red, warm cheeks); mood swings; menstrual changes.  Uncommon: Long-term decrease or suppression of natural hormones; bone thinning. (These are more common with higher doses or more frequent use. This is why we prefer that our patients avoid having any injection therapies in other practices.)   Very Rare: Severe mood changes; psychosis; aseptic necrosis.  From procedure: Some discomfort is to be expected once the numbing medicine wears off. This should be minimal if ice and heat are applied as instructed.  Call if: (When should I call?)  You experience numbness and weakness that gets worse with time, as opposed to wearing off.  New onset bowel or bladder incontinence. (Applies only to procedures done in the spine)  Emergency Numbers:  Durning business hours (Monday - Thursday, 8:00 AM - 4:00 PM) (Friday, 9:00 AM - 12:00 Noon): [phone]  After hours: [phone]  NOTE: If you are having a problem and are unable connect with, or to talk to a provider, then go to your nearest urgent care or emergency department. If the problem is serious and urgent, please call 911. ____________________________________________________________________________________________   Pain Management Discharge Instructions  General Discharge Instructions :  If you need to reach your doctor call: Monday-Friday 8:00 am - 4:00 pm at [phone] or toll free [phone].  After clinic hours [phone] to have operator reach doctor.  Bring all of your medication bottles to all your appointments in the pain clinic.  To cancel or reschedule your appointment with Pain Management please remember to call 24 hours in advance to avoid a fee.  Refer to the educational materials which you have been given on: General Risks, I had my  Procedure. Discharge Instructions, Post Sedation.  Post Procedure Instructions:  The drugs you were given will stay in your system until tomorrow, so for the next 24 hours you should not drive, make any legal decisions or drink any alcoholic beverages.  You may eat anything you prefer, but it is better to start with liquids then soups and crackers, and gradually work up to solid foods.  Please notify your doctor immediately if you have any unusual bleeding, trouble breathing or pain that is not related to your normal pain.  Depending on the type of procedure that was done, some parts of your body may feel week and/or numb.  This usually clears up by tonight or the next day.  Walk with the use of an assistive device or accompanied by an adult for the 24 hours.  You may use ice on the affected area for the first 24 hours.  Put ice in a Ziploc bag and cover with a towel and place against area 15 minutes on 15 minutes off.  You may switch to heat after 24 hours.Epidural Steroid Injection Patient Information  Description: The epidural space surrounds the nerves as they exit the spinal cord.  In some patients, the nerves can be compressed and inflamed by a bulging disc or a tight spinal canal (spinal stenosis).  By injecting steroids into the epidural space, we can bring irritated nerves   into direct contact with a potentially helpful medication.  These steroids act directly on the irritated nerves and can reduce swelling and inflammation which often leads to decreased pain.  Epidural steroids may be injected anywhere along the spine and from the neck to the low back depending upon the location of your pain.   After numbing the skin with local anesthetic (like Novocaine), a small needle is passed into the epidural space slowly.  You may experience a sensation of pressure while this is being done.  The entire block usually last less than 10 minutes.  Conditions which may be treated by epidural  steroids:   Low back and leg pain  Neck and arm pain  Spinal stenosis  Post-laminectomy syndrome  Herpes zoster (shingles) pain  Pain from compression fractures  Preparation for the injection:  1. Do not eat any solid food or dairy products within 8 hours of your appointment.  2. You may drink clear liquids up to 3 hours before appointment.  Clear liquids include water, black coffee, juice or soda.  No milk or cream please. 3. You may take your regular medication, including pain medications, with a sip of water before your appointment  Diabetics should hold regular insulin (if taken separately) and take 1/2 normal NPH dos the morning of the procedure.  Carry some sugar containing items with you to your appointment. 4. A driver must accompany you and be prepared to drive you home after your procedure.  5. Bring all your current medications with your. 6. An IV may be inserted and sedation may be given at the discretion of the physician.   7. A blood pressure cuff, EKG and other monitors will often be applied during the procedure.  Some patients may need to have extra oxygen administered for a short period. 8. You will be asked to provide medical information, including your allergies, prior to the procedure.  We must know immediately if you are taking blood thinners (like Coumadin/Warfarin)  Or if you are allergic to IV iodine contrast (dye). We must know if you could possible be pregnant.  Possible side-effects:  Bleeding from needle site  Infection (rare, may require surgery)  Nerve injury (rare)  Numbness & tingling (temporary)  Difficulty urinating (rare, temporary)  Spinal headache ( a headache worse with upright posture)  Light -headedness (temporary)  Pain at injection site (several days)  Decreased blood pressure (temporary)  Weakness in arm/leg (temporary)  Pressure sensation in back/neck (temporary)  Call if you experience:  Fever/chills associated with  headache or increased back/neck pain.  Headache worsened by an upright position.  New onset weakness or numbness of an extremity below the injection site  Hives or difficulty breathing (go to the emergency room)  Inflammation or drainage at the infection site  Severe back/neck pain  Any new symptoms which are concerning to you  Please note:  Although the local anesthetic injected can often make your back or neck feel good for several hours after the injection, the pain will likely return.  It takes 3-7 days for steroids to work in the epidural space.  You may not notice any pain relief for at least that one week.  If effective, we will often do a series of three injections spaced 3-6 weeks apart to maximally decrease your pain.  After the initial series, we generally will wait several months before considering a repeat injection of the same type.  If you have any questions, please call [phone] [facility] 

## 2019-03-27 ENCOUNTER — Inpatient Hospital Stay: Payer: Medicare Other

## 2019-03-27 ENCOUNTER — Other Ambulatory Visit: Payer: Self-pay

## 2019-03-27 ENCOUNTER — Telehealth: Payer: Self-pay | Admitting: *Deleted

## 2019-03-27 VITALS — BP 113/73 | HR 82 | Temp 96.1°F | Resp 18

## 2019-03-27 DIAGNOSIS — C3490 Malignant neoplasm of unspecified part of unspecified bronchus or lung: Secondary | ICD-10-CM | POA: Diagnosis not present

## 2019-03-27 DIAGNOSIS — G629 Polyneuropathy, unspecified: Secondary | ICD-10-CM | POA: Diagnosis not present

## 2019-03-27 DIAGNOSIS — Z5111 Encounter for antineoplastic chemotherapy: Secondary | ICD-10-CM | POA: Diagnosis not present

## 2019-03-27 DIAGNOSIS — C787 Secondary malignant neoplasm of liver and intrahepatic bile duct: Secondary | ICD-10-CM | POA: Diagnosis not present

## 2019-03-27 DIAGNOSIS — C7951 Secondary malignant neoplasm of bone: Secondary | ICD-10-CM | POA: Diagnosis not present

## 2019-03-27 DIAGNOSIS — D696 Thrombocytopenia, unspecified: Secondary | ICD-10-CM | POA: Diagnosis not present

## 2019-03-27 MED ORDER — TOPOTECAN HCL CHEMO INJECTION 4 MG
1.3000 mg/m2 | Freq: Once | INTRAVENOUS | Status: AC
  Administered 2019-03-27: 10:00:00 2.7 mg via INTRAVENOUS
  Filled 2019-03-27: qty 2.7

## 2019-03-27 MED ORDER — HEPARIN SOD (PORK) LOCK FLUSH 100 UNIT/ML IV SOLN
INTRAVENOUS | Status: AC
  Filled 2019-03-27: qty 5

## 2019-03-27 MED ORDER — PROCHLORPERAZINE MALEATE 10 MG PO TABS
10.0000 mg | ORAL_TABLET | Freq: Once | ORAL | Status: AC
  Administered 2019-03-27: 10:00:00 10 mg via ORAL
  Filled 2019-03-27: qty 1

## 2019-03-27 MED ORDER — SODIUM CHLORIDE 0.9% FLUSH
10.0000 mL | INTRAVENOUS | Status: DC | PRN
  Administered 2019-03-27: 10:00:00 10 mL
  Filled 2019-03-27: qty 10

## 2019-03-27 MED ORDER — HEPARIN SOD (PORK) LOCK FLUSH 100 UNIT/ML IV SOLN
500.0000 [IU] | Freq: Once | INTRAVENOUS | Status: AC | PRN
  Administered 2019-03-27: 500 [IU]
  Filled 2019-03-27: qty 5

## 2019-03-27 MED ORDER — SODIUM CHLORIDE 0.9 % IV SOLN
Freq: Once | INTRAVENOUS | Status: AC
  Filled 2019-03-27: qty 250

## 2019-03-27 NOTE — Telephone Encounter (Signed)
Attempted to reach patient, no answer and mailbox is full.

## 2019-03-28 ENCOUNTER — Other Ambulatory Visit: Payer: Medicare Other

## 2019-03-28 ENCOUNTER — Inpatient Hospital Stay: Payer: Medicare Other

## 2019-03-28 NOTE — Progress Notes (Signed)
Tumor Board Documentation  Grace [name] was presented by Dr Grayland Ormond at our Tumor Board on 03/28/2019, which included representatives from medical oncology, radiation oncology, navigation, pathology, radiology, surgical, surgical oncology, internal medicine, pharmacy, genetics, palliative care, research.  Grace [name] currently presents as a current patient, for discussion with history of the following treatments: adjuvant chemotherapy, surgical intervention(s).  Additionally, we reviewed previous medical and familial history, history of present illness, and recent lab results along with all available histopathologic and imaging studies. The tumor board considered available treatment options and made the following recommendations:   Continue current Chemotherapy and possible Ablation in future  The following procedures/referrals were also placed: No orders of the defined types were placed in this encounter.   Clinical Trial Status: not discussed   Staging used:    National site-specific guidelines   were discussed with respect to the case.  Tumor board is a meeting of clinicians from various specialty areas who evaluate and discuss patients for whom a multidisciplinary approach is being considered. Final determinations in the plan of care are those of the provider(s). The responsibility for follow up of recommendations given during tumor board is that of the provider.   Today's extended care, comprehensive team conference, Grace [name] was not present for the discussion and was not examined.   Multidisciplinary Tumor Board is a multidisciplinary case peer review process.  Decisions discussed in the Multidisciplinary Tumor Board reflect the opinions of the specialists present at the conference without having examined the patient.  Ultimately, treatment and diagnostic decisions rest with the primary provider(s) and the patient.

## 2019-03-29 ENCOUNTER — Other Ambulatory Visit: Payer: Self-pay

## 2019-03-29 ENCOUNTER — Inpatient Hospital Stay: Payer: Medicare Other

## 2019-03-29 VITALS — BP 152/94 | HR 98 | Temp 95.8°F | Resp 20

## 2019-03-29 DIAGNOSIS — C3490 Malignant neoplasm of unspecified part of unspecified bronchus or lung: Secondary | ICD-10-CM

## 2019-03-29 DIAGNOSIS — Z5111 Encounter for antineoplastic chemotherapy: Secondary | ICD-10-CM | POA: Diagnosis not present

## 2019-03-29 DIAGNOSIS — C7951 Secondary malignant neoplasm of bone: Secondary | ICD-10-CM | POA: Diagnosis not present

## 2019-03-29 DIAGNOSIS — C787 Secondary malignant neoplasm of liver and intrahepatic bile duct: Secondary | ICD-10-CM | POA: Diagnosis not present

## 2019-03-29 DIAGNOSIS — D696 Thrombocytopenia, unspecified: Secondary | ICD-10-CM | POA: Diagnosis not present

## 2019-03-29 DIAGNOSIS — G629 Polyneuropathy, unspecified: Secondary | ICD-10-CM | POA: Diagnosis not present

## 2019-03-29 MED ORDER — SODIUM CHLORIDE 0.9% FLUSH
10.0000 mL | INTRAVENOUS | Status: DC | PRN
  Administered 2019-03-29: 09:00:00 10 mL
  Filled 2019-03-29: qty 10

## 2019-03-29 MED ORDER — TOPOTECAN HCL CHEMO INJECTION 4 MG
1.3000 mg/m2 | Freq: Once | INTRAVENOUS | Status: AC
  Administered 2019-03-29: 2.7 mg via INTRAVENOUS
  Filled 2019-03-29: qty 2.7

## 2019-03-29 MED ORDER — SODIUM CHLORIDE 0.9 % IV SOLN
Freq: Once | INTRAVENOUS | Status: AC
  Filled 2019-03-29: qty 250

## 2019-03-29 MED ORDER — PROCHLORPERAZINE MALEATE 10 MG PO TABS
10.0000 mg | ORAL_TABLET | Freq: Once | ORAL | Status: AC
  Administered 2019-03-29: 09:00:00 10 mg via ORAL
  Filled 2019-03-29: qty 1

## 2019-03-29 MED ORDER — HEPARIN SOD (PORK) LOCK FLUSH 100 UNIT/ML IV SOLN
500.0000 [IU] | Freq: Once | INTRAVENOUS | Status: AC | PRN
  Administered 2019-03-29: 500 [IU]
  Filled 2019-03-29: qty 5

## 2019-04-09 ENCOUNTER — Telehealth: Payer: Self-pay

## 2019-04-09 NOTE — Progress Notes (Deleted)
Patient: Grace [name]  Service Category: E/M  Provider: Gaspar Cola, MD  DOB: July 29, 1950  DOS: 04/10/2019  Location: Office  MRN: [id number]  Setting: Ambulatory outpatient  Referring Provider: [name], MD  Type: Established Patient  Specialty: Interventional Pain Management  PCP: [name], MD  Location: Remote location  Delivery: TeleHealth     Virtual Encounter - Pain Management PROVIDER NOTE: Information contained herein reflects review and annotations entered in association with encounter. Interpretation of such information and data should be left to medically-trained personnel. Information provided to patient can be located elsewhere in the medical record under "Patient Instructions". Document created using STT-dictation technology, any transcriptional errors that may result from process are unintentional.    Contact & Pharmacy Preferred: [phone] Home: [phone] (home) Mobile: [phone] (mobile) E-mail: [email or web address]  [id number] [id number]: [phone]: [phone]  Pre-screening  Ms. Doebler offered "in-person" vs "virtual" encounter. She indicated preferring virtual for this encounter.   Reason COVID-19*  Social distancing based on CDC and AMA recommendations.   I contacted CChalmers Gueston 04/10/2019 via telephone.      I clearly identified myself as FGaspar Cola MD. I verified that I was speaking with the correct person using two identifiers (Name: CJOLANE BANKHEAD and date of birth: [birth year].  Consent I sought verbal advanced consent from CChalmers Guestfor virtual visit interactions. I informed Ms. Linhares of possible security and privacy concerns, risks, and limitations associated with providing "not-in-person" medical evaluation and management services. I also informed Ms. Ramroop of the availability of "in-person" appointments. Finally, I informed  her that there would be a charge for the virtual visit and that she could be  personally, fully or partially, financially responsible for it. Ms. QKapuscinskiexpressed understanding and agreed to proceed.   Historic Elements   Ms. CRHINA KRAMMEis a 69y.o. year old, female patient evaluated today after her last contact with our practice on 04/09/2019. Ms. QBruss has a past medical history of Anxiety, Arthritis, Asthma, CAP (community acquired pneumonia) (06/30/2014), Cardiomyopathy ([place], CHF (congestive heart failure) ([place], COPD (chronic obstructive pulmonary disease) ([place], Depression, Dyspnea, GERD (gastroesophageal reflux disease), Hyperlipidemia, Hypertension, Pneumonia, PVC's (premature ventricular contractions), Sepsis ([place] (06/30/2014), [place] cell lung cancer ([place] (12/2016), SOB (shortness of breath) (05/08/2014), and Spinal cord injury at T7-T12 level ([facility]. She also  has a past surgical history that includes Cardiac catheterization; Cesarean section; Appendectomy; Cataract extraction; Colonoscopy; Lumbar laminectomy/decompression microdiscectomy (Left, 08/01/2016); Laminotomy; and PORTA CATH INSERTION (N/A, 02/03/2017). Ms. QEstabrookshas a current medication list which includes the following prescription(s): advair diskus, albuterol, alendronate, aripiprazole, dextromethorphan-guaifenesin, duloxetine, gabapentin, ipratropium-albuterol, lorazepam, metoprolol succinate, morphine, pantoprazole, potassium chloride sa, prochlorperazine, promethazine, sacubitril-valsartan, tizanidine, and varenicline, and the following Facility-Administered Medications: sodium chloride flush. She  reports that she quit smoking about 4 years ago. Her smoking use included cigarettes. She has a 25.00 pack-year smoking history. She has never used smokeless tobacco. She reports that she does not drink alcohol or use drugs. Ms. QBeightolis allergic to fish allergy and fentanyl.   HPI  Today, she is being contacted for a  post-procedure assessment.  Post-Procedure Evaluation  Procedure (03/26/2019): Diagnostic left-sided L4-5 LESI #1 under fluoroscopic guidance, no sedation Pre-procedure pain level:  0/10 Post-procedure: 1/10          Sedation: None.  No notes on fileCurrent benefits: Defined as benefit that persist at this  time.   Analgesia:   ***  Function:  ***  ROM:  ***   Pharmacotherapy Assessment  Analgesic: No opioid analgesics prescribed by our practice.   Monitoring: [place] PMP: PDMP reviewed during this encounter.       Pharmacotherapy: No side-effects or adverse reactions reported. Compliance: No problems identified. Effectiveness: Clinically acceptable. Plan: Refer to "POC".  UDS: No results found for: SUMMARY Laboratory Chemistry Profile   Renal Lab Results  Component Value Date   BUN 11 03/26/2019   CREATININE 0.84 03/26/2019   BCR 13 10/11/2016   GFRAA >60 03/26/2019   GFRNONAA >60 03/26/2019    Hepatic Lab Results  Component Value Date   AST 32 03/26/2019   ALT 36 03/26/2019   ALBUMIN 3.3 (L) 03/26/2019   ALKPHOS 89 03/26/2019   LIPASE 225 02/21/2014   AMMONIA 26 08/14/2017    Electrolytes Lab Results  Component Value Date   NA 136 03/26/2019   K 3.5 03/26/2019   CL 101 03/26/2019   CALCIUM 8.6 (L) 03/26/2019   MG 1.8 09/13/2017    Bone No results found for: VD25OH, VD125OH2TOT, VP7106YI9, SW5462VO3, 25OHVITD1, 25OHVITD2, 25OHVITD3, TESTOFREE, TESTOSTERONE  Inflammation (CRP: Acute Phase) (ESR: Chronic Phase) Lab Results  Component Value Date   LATICACIDVEN 0.7 08/14/2017      Note: Above Lab results reviewed.  Imaging  DG PAIN CLINIC C-ARM 1-60 MIN NO REPORT Fluoro was used, but no Radiologist interpretation will be provided.  Please refer to "NOTES" tab for provider progress note.  Assessment  The primary encounter diagnosis was DDD (degenerative disc disease), lumbosacral. 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  Plan of Care  Problem-specific:  No problem-specific Assessment & Plan notes found for this encounter.  Ms. DELOYCE WALTHERS has a current medication list which includes the following long-term medication(s): albuterol, aripiprazole, duloxetine, ipratropium-albuterol, metoprolol succinate, potassium chloride sa, prochlorperazine, promethazine, and tizanidine.  Pharmacotherapy (Medications Ordered): No orders of the defined types were placed in this encounter.  Orders:  No orders of the defined types were placed in this encounter.  Follow-up plan:   No follow-ups on file.      Interventional management options: Planned, scheduled, and/or pending:      Considering:   Therapeutic bilateral IA knee joint injections w/ Hyalgan Diagnostic bilateral Genicular NB Possible bilateral Genicular nerve RFA Diagnostic left suprascapular NB Possible bilateral suprascapular nerve RFA   Palliative PRN treatment(s):   Palliative right medial epicondyle elbow injection #2  Palliative right IA knee injection #3  Palliative left IA knee injection #4  Palliative right suprascapular NB #2  Palliative right IA shoulder (glenohumeral) #2  Palliative right acromioclavicular joint injection #2  Palliative left IA shoulder (glenohumeral) #3  Palliative left acromioclavicular joint injection #2       Recent Visits Date Type Provider Dept  03/26/19 Procedure visit Milinda Pointer, MD [facility]  03/12/19 Telemedicine Milinda Pointer, MD [facility]  02/26/19 Procedure visit Milinda Pointer, MD [facility]  02/25/19 Telemedicine Milinda Pointer, MD [facility]  Showing recent visits within past 90 days and meeting all other requirements   Today's  Visits Date Type Provider Dept  04/10/19 Appointment Milinda Pointer, MD [facility]  Showing today's visits and meeting all other requirements   Future Appointments No visits were  found meeting these conditions.  Showing future appointments within next 90 days and meeting all other requirements   I discussed the assessment and treatment plan with the patient. The patient was provided an opportunity to ask questions and all were answered. The patient agreed with the plan and demonstrated an understanding of the instructions.  Patient advised to call back or seek an in-person evaluation if the symptoms or condition worsens.  Duration of encounter: *** minutes.  Note by: Gaspar Cola, MD Date: 04/10/2019; Time: 3:29 PM

## 2019-04-09 NOTE — Telephone Encounter (Signed)
Attempted to call patient. No answer. Unable to leave message r/t mailbox being full.

## 2019-04-10 ENCOUNTER — Other Ambulatory Visit: Payer: Self-pay

## 2019-04-10 ENCOUNTER — Telehealth: Payer: Self-pay | Admitting: *Deleted

## 2019-04-10 ENCOUNTER — Ambulatory Visit: Payer: Medicare Other | Attending: Pain Medicine | Admitting: Pain Medicine

## 2019-04-10 DIAGNOSIS — M5137 Other intervertebral disc degeneration, lumbosacral region: Secondary | ICD-10-CM

## 2019-04-10 DIAGNOSIS — S24103S Unspecified injury at T7-T10 level of thoracic spinal cord, sequela: Secondary | ICD-10-CM | POA: Insufficient documentation

## 2019-04-10 DIAGNOSIS — M5416 Radiculopathy, lumbar region: Secondary | ICD-10-CM

## 2019-04-10 DIAGNOSIS — M5442 Lumbago with sciatica, left side: Secondary | ICD-10-CM

## 2019-04-10 DIAGNOSIS — M48062 Spinal stenosis, lumbar region with neurogenic claudication: Secondary | ICD-10-CM

## 2019-04-10 DIAGNOSIS — R937 Abnormal findings on diagnostic imaging of other parts of musculoskeletal system: Secondary | ICD-10-CM

## 2019-04-10 DIAGNOSIS — M79605 Pain in left leg: Secondary | ICD-10-CM

## 2019-04-10 DIAGNOSIS — M5441 Lumbago with sciatica, right side: Secondary | ICD-10-CM

## 2019-04-10 DIAGNOSIS — G8929 Other chronic pain: Secondary | ICD-10-CM

## 2019-04-10 NOTE — Telephone Encounter (Signed)
Attempted to reach patient, no answer and no voicemail capability on mobile phone.  No answer on [phone]

## 2019-04-10 NOTE — Progress Notes (Signed)
Unsuccessful attempt to contact patient for Virtual Visit (Pain Management Telehealth)   Patient provided contact information:  [phone] (home); [phone] (mobile); (Preferred) [phone] [email or web address]   Pre-screening:  Our staff was unsuccessful in contacting Ms. Grace [name] using the above provided information.   I unsuccessfully attempted to make contact with Grace [name] on 04/10/2019 via telephone. I was unable to complete the virtual encounter due to call going directly to voicemail. I was unable to leave a message due to the mailbox being full and not accepting any new messages.  Pharmacotherapy Assessment  Analgesic: No opioid analgesics prescribed by our practice.   Post-Procedure Evaluation  Procedure (03/26/2019): Diagnostic left-sided L4-5 LESI #1 under fluoroscopic guidance, no sedation Pre-procedure pain level:  0/10 Post-procedure: 1/10          Follow-up plan:   Reschedule Visit.     Interventional management options: Planned, scheduled, and/or pending:      Considering:   Therapeutic bilateral IA knee joint injections w/ Hyalgan Diagnostic bilateral Genicular NB Possible bilateral Genicular nerve RFA Diagnostic left suprascapular NB Possible bilateral suprascapular nerve RFA   Palliative PRN treatment(s):   Palliative right medial epicondyle elbow injection #2  Palliative right IA knee injection #3  Palliative left IA knee injection #4  Palliative right suprascapular NB #2  Palliative right IA shoulder (glenohumeral) #2  Palliative right acromioclavicular joint injection #2  Palliative left IA shoulder (glenohumeral) #3  Palliative left acromioclavicular joint injection #2     Recent Visits Date Type Provider Dept  03/26/19 Procedure visit Milinda Pointer, MD [facility]  03/12/19 Telemedicine Milinda Pointer, MD [facility]  02/26/19 Procedure visit Milinda Pointer, MD [facility]  02/25/19  Telemedicine Milinda Pointer, MD [facility]  Showing recent visits within past 90 days and meeting all other requirements   Today's Visits Date Type Provider Dept  04/10/19 Telemedicine Milinda Pointer, MD [facility]  Showing today's visits and meeting all other requirements   Future Appointments No visits were found meeting these conditions.  Showing future appointments within next 90 days and meeting all other requirements    Note by: Gaspar Cola, MD Date: 04/10/2019; Time: 3:32 PM

## 2019-04-19 NOTE — Progress Notes (Deleted)
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  INTERVAL HISTORY: Patient returns to clinic today for further evaluation, discussion of her imaging results, and consideration of cycle 17 of monthly topotecan.  She missed day 1 of treatment yesterday secondary to worsening neuropathy.  She otherwise has felt well.  She continues to have chronic weakness and fatigue.  Her pain and nausea are well controlled on her current medications. She has no other neurologic complaints.  She denies any recent fevers or illnesses.  She has a good appetite and denies weight loss.  She has chronic shortness of breath, but denies any chest pain, cough, or hemoptysis. She has no abdominal pain.  She denies any vomiting, constipation, or diarrhea.  She denies any melena or hematochezia.  She has no urinary complaints.  Patient offers no further specific complaints today.  REVIEW OF SYSTEMS:   Review of Systems  Constitutional: Positive for malaise/fatigue. Negative for fever and weight loss.  Respiratory: Negative.  Negative for cough, hemoptysis, shortness of breath and wheezing.   Cardiovascular: Negative.  Negative for chest pain and leg swelling.  Gastrointestinal: Negative for abdominal pain, blood in stool, diarrhea, melena, nausea and vomiting.  Genitourinary: Negative.  Negative for dysuria and frequency.  Musculoskeletal: Positive for back pain, joint pain and neck pain.  Skin: Negative.  Negative for rash.  Neurological: Positive for sensory change and weakness. Negative for focal weakness and headaches.  Psychiatric/Behavioral: Negative.  Negative for  memory loss. The patient is not nervous/anxious.     As per HPI. Otherwise, a complete review of systems is negative.  PAST MEDICAL HISTORY: Past Medical History:  Diagnosis Date  . Anxiety   . Arthritis   . Asthma    COPD  . CAP (community acquired pneumonia) 06/30/2014  . Cardiomyopathy ([place])    nonischemic (EF 35-40%)  . CHF (congestive heart failure) ([place])   . COPD (chronic obstructive pulmonary disease) ([place])   . Depression   . Dyspnea   . GERD (gastroesophageal reflux disease)   . Hyperlipidemia   . Hypertension   . Pneumonia   . PVC's (premature ventricular contractions)    states 10 years ago  . Sepsis ([place]) 06/30/2014  . Small cell lung cancer ([place]) 12/2016   Chemo tx's.  . SOB (shortness of breath) 05/08/2014  . Spinal cord injury at T7-T12 level [facility])    2016    PAST SURGICAL HISTORY: Past Surgical History:  Procedure Laterality Date  . APPENDECTOMY    . CARDIAC CATHETERIZATION    . CATARACT EXTRACTION     right eye   . CESAREAN SECTION    . COLONOSCOPY    . LAMINOTOMY    . LUMBAR LAMINECTOMY/DECOMPRESSION MICRODISCECTOMY Left 08/01/2016   Procedure: Left Lumbar Four-Five Laminectomy and Foraminotomy;  Surgeon: Earnie Larsson, MD;  Location: [facility];  Service: Neurosurgery;  Laterality: Left;  . PORTA CATH INSERTION N/A 02/03/2017   Procedure: PORTA CATH INSERTION;  Surgeon: Katha Cabal, MD;  Location: [place] [facility];  Service: Cardiovascular;  Laterality: N/A;    FAMILY HISTORY: Family History  Problem Relation Age of Onset  . Heart attack Father 65  MI x 3     ADVANCED DIRECTIVES (Y/N):  N  HEALTH MAINTENANCE: Social History   Tobacco Use  . Smoking status: Former Smoker    Packs/day: 0.50    Years: 50.00    Pack years: 25.00    Types: Cigarettes    Quit date: 01/27/2015    Years since quitting: 4.2  . Smokeless tobacco: Never Used  Substance Use Topics  . Alcohol use: No  . Drug use: No     Colonoscopy:  PAP:  Bone  density:  Lipid panel:  Allergies  Allergen Reactions  . Fish Allergy Anaphylaxis    Per pt after eating [facility] she had throat swelling and SOB. MSY  Per pt after eating [facility] she had throat swelling and SOB. MSY   . Fentanyl     Overly sedated and groggy, ineffective     Current Outpatient Medications  Medication Sig Dispense Refill  . ADVAIR DISKUS 250-50 MCG/DOSE AEPB Inhale 1 puff into the lungs 2 (two) times daily.    Marland Kitchen albuterol (PROAIR HFA) 108 (90 Base) MCG/ACT inhaler Inhale 2 puffs into the lungs every 6 (six) hours as needed for wheezing or shortness of breath.     Marland Kitchen alendronate (FOSAMAX) 70 MG tablet Take 70 mg by mouth every Thursday.     . ARIPiprazole (ABILIFY) 5 MG tablet Take 5 mg by mouth daily.    Marland Kitchen dextromethorphan-guaiFENesin (MUCINEX DM) 30-600 MG 12hr tablet Take 1 tablet by mouth 2 (two) times daily as needed for cough.    . DULoxetine (CYMBALTA) 60 MG capsule Take 60 mg by mouth daily.    Marland Kitchen gabapentin (NEURONTIN) 100 MG capsule Take 100 mg by mouth 3 (three) times daily.    Marland Kitchen ipratropium-albuterol (DUONEB) 0.5-2.5 (3) MG/3ML SOLN Take 3 mLs by nebulization every 4 (four) hours as needed. 360 mL 1  . LORazepam (ATIVAN) 0.5 MG tablet Take 0.5 mg by mouth as needed.    . metoprolol succinate (TOPROL-XL) 100 MG 24 hr tablet Take 1 tablet by mouth 1 day or 1 dose.    . morphine (MS CONTIN) 30 MG 12 hr tablet Take 1 tablet (30 mg total) by mouth every 8 (eight) hours. 90 tablet 0  . pantoprazole (PROTONIX) 40 MG tablet Take 1 tablet by mouth daily.    . potassium chloride SA (K-DUR,KLOR-CON) 20 MEQ tablet Take 1 tablet (20 mEq total) by mouth daily. 30 tablet 2  . prochlorperazine (COMPAZINE) 10 MG tablet TAKE 1 TABLET (10 MG TOTAL) BY MOUTH EVERY 6 (SIX) HOURS AS NEEDED (NAUSEA OR VOMITING). 60 tablet 2  . promethazine (PHENERGAN) 25 MG tablet Take 1 tablet (25 mg total) by mouth every 8 (eight) hours as needed for nausea or vomiting. 30 tablet 2  .  sacubitril-valsartan (ENTRESTO) 24-26 MG Take 1 tablet by mouth 2 (two) times daily. (Patient taking differently: Take 1 tablet by mouth daily. Pt taking 5mg ) 180 tablet 3  . tiZANidine (ZANAFLEX) 4 MG tablet Take 1 tablet (4 mg total) by mouth every 8 (eight) hours as needed for muscle spasms. (Patient not taking: Reported on 03/26/2019) 90 tablet 2  . varenicline (CHANTIX) 1 MG tablet Take 1 mg by mouth 2 (two) times daily.     No current facility-administered medications for this visit.   Facility-Administered Medications Ordered in Other Visits  Medication Dose Route Frequency Provider Last Rate Last Admin  . sodium chloride flush (NS) 0.9 % injection 10 mL  10 mL Intravenous PRN  Lloyd Huger, MD   10 mL at 03/01/17 1610    OBJECTIVE: There were no vitals filed for this visit.   There is no height or weight on file to calculate BMI.    ECOG FS:1 - Symptomatic but completely ambulatory  General: Well-developed, well-nourished, no acute distress.  Sitting in a wheelchair. Eyes: Pink conjunctiva, anicteric sclera. HEENT: Normocephalic, moist mucous membranes. Lungs: No audible wheezing or coughing. Heart: Regular rate and rhythm. Abdomen: Soft, nontender, no obvious distention. Musculoskeletal: No edema, cyanosis, or clubbing. Neuro: Alert, answering all questions appropriately. Cranial nerves grossly intact. Skin: No rashes or petechiae noted. Psych: Normal affect.   LAB RESULTS:  Lab Results  Component Value Date   NA 136 03/26/2019   K 3.5 03/26/2019   CL 101 03/26/2019   CO2 26 03/26/2019   GLUCOSE 149 (H) 03/26/2019   BUN 11 03/26/2019   CREATININE 0.84 03/26/2019   CALCIUM 8.6 (L) 03/26/2019   PROT 7.9 03/26/2019   ALBUMIN 3.3 (L) 03/26/2019   AST 32 03/26/2019   ALT 36 03/26/2019   ALKPHOS 89 03/26/2019   BILITOT 0.4 03/26/2019   GFRNONAA >60 03/26/2019   GFRAA >60 03/26/2019    Lab Results  Component Value Date   WBC 11.0 (H) 03/26/2019   NEUTROABS 8.1  (H) 03/26/2019   HGB 10.6 (L) 03/26/2019   HCT 36.6 03/26/2019   MCV 93.4 03/26/2019   PLT 275 03/26/2019     STUDIES: CT Chest W Contrast  Result Date: 03/22/2019 CLINICAL DATA:  Small cell lung cancer restaging EXAM: CT CHEST, ABDOMEN, AND PELVIS WITH CONTRAST TECHNIQUE: Multidetector CT imaging of the chest, abdomen and pelvis was performed following the standard protocol during bolus administration of intravenous contrast. CONTRAST:  132mL OMNIPAQUE IOHEXOL 300 MG/ML  SOLN COMPARISON:  Multiple exams, including 12/26/2018 FINDINGS: CT CHEST FINDINGS Cardiovascular: Right Port-A-Cath tip: SVC. Coronary, aortic arch, and branch vessel atherosclerotic vascular disease. Mediastinum/Nodes: Right hilar node 0.9 cm in short axis, stable by my measurement. Anterior pericardial node 0.8 cm in short axis on image 41/2, previously 0.4 cm. Lungs/Pleura: Left upper lobe mass observed with adjacent postobstructive atelectasis. The mass measures 2.5 cm in short axis, previously the same. The long axis measurement is more difficult to estimate due to the atelectasis obscuring the margin, but approximately 2.8 cm on image 20/2, which is stable. Centrilobular emphysema. Mild scarring posteriorly in the right upper lobe. Part solid 1.1 by 0.6 cm right lower lobe nodule on image 76/3, by my measurements previously 1.2 by 0.6 cm. Stable right lower lobe and right middle lobe scarring. Left lower lobe 0.7 by 0.5 cm subpleural nodule on image 95/3, stable. 3 mm subpleural nodule in the left upper lobe adjacent to the mediastinum on image 60/3, stable. Musculoskeletal: Scattered multifocal bony sclerosis in the thorax compatible with previous osseous metastatic disease. No significant change distribution. T5 vertebra plana. Wedging at T4 and T7, unchanged. Chronic mild superior endplate compression at L1. CT ABDOMEN PELVIS FINDINGS Hepatobiliary: Rim enhancing lesion in segment 4 of the liver adjacent to the falciform  ligament demonstrates increased central hypodensity and measures about 3.0 by 2.7 cm, previously 2.2 by 2.1 cm. Rim enhancing lesion in the lateral segment left hepatic lobe measures 2.0 by 1.8 cm, previously 2.1 by 2.1. Contracted gallbladder. Stable mild extrahepatic biliary prominence with the common bile duct at about 0.9 cm. Pancreas: Unremarkable Spleen: Unremarkable Adrenals/Urinary Tract: Mild scarring in the left kidney upper pole. Adrenal glands unremarkable. Urinary bladder unremarkable.  Stomach/Bowel: Mild sigmoid colon diverticulosis. Prominent stool throughout the colon favors constipation. Vascular/Lymphatic: Aortoiliac atherosclerotic vascular disease. No pathologic adenopathy is identified. Reproductive: No supplemental non-categorized findings. Other: No supplemental non-categorized findings. Musculoskeletal: 1.1 cm of grade 2 anterolisthesis of L4 on L5 associated with a chronic right pars defect and degenerative facet arthropathy, resulting in bilateral foraminal impingement and central narrowing of the thecal sac this level. Stable scattered sclerotic osseous metastatic disease. IMPRESSION: 1. Stable size of the left upper lobe mass with adjacent postobstructive atelectasis. The other smaller lung nodules are stable. 2. Enlarging hypodense metastatic lesion in segment 4 of the liver. On the other hand, the lateral segment left hepatic lobe metastatic lesion appears mildly smaller. 3. Stable scattered sclerotic osseous metastatic disease. 4. A anterior pericardial lymph node has enlarged compared to prior, currently 0.8 cm in short axis. 5. Other imaging findings of potential clinical significance: Coronary atherosclerosis. Prominent stool throughout the colon favors constipation. Mild sigmoid colon diverticulosis. Grade 2 anterolisthesis of L4 on L5 associated with a chronic right pars defect and degenerative facet arthropathy, resulting in bilateral foraminal impingement and central narrowing  of the thecal sac. Compression fractures in the thoracic and upper lumbar spine as before, including vertebra plana at the T5 level. Aortic Atherosclerosis (ICD10-I70.0) and Emphysema (ICD10-J43.9). Electronically Signed   By: Van Clines M.D.   On: 03/22/2019 11:18   CT Abdomen Pelvis W Contrast  Result Date: 03/22/2019 CLINICAL DATA:  Small cell lung cancer restaging EXAM: CT CHEST, ABDOMEN, AND PELVIS WITH CONTRAST TECHNIQUE: Multidetector CT imaging of the chest, abdomen and pelvis was performed following the standard protocol during bolus administration of intravenous contrast. CONTRAST:  164mL OMNIPAQUE IOHEXOL 300 MG/ML  SOLN COMPARISON:  Multiple exams, including 12/26/2018 FINDINGS: CT CHEST FINDINGS Cardiovascular: Right Port-A-Cath tip: SVC. Coronary, aortic arch, and branch vessel atherosclerotic vascular disease. Mediastinum/Nodes: Right hilar node 0.9 cm in short axis, stable by my measurement. Anterior pericardial node 0.8 cm in short axis on image 41/2, previously 0.4 cm. Lungs/Pleura: Left upper lobe mass observed with adjacent postobstructive atelectasis. The mass measures 2.5 cm in short axis, previously the same. The long axis measurement is more difficult to estimate due to the atelectasis obscuring the margin, but approximately 2.8 cm on image 20/2, which is stable. Centrilobular emphysema. Mild scarring posteriorly in the right upper lobe. Part solid 1.1 by 0.6 cm right lower lobe nodule on image 76/3, by my measurements previously 1.2 by 0.6 cm. Stable right lower lobe and right middle lobe scarring. Left lower lobe 0.7 by 0.5 cm subpleural nodule on image 95/3, stable. 3 mm subpleural nodule in the left upper lobe adjacent to the mediastinum on image 60/3, stable. Musculoskeletal: Scattered multifocal bony sclerosis in the thorax compatible with previous osseous metastatic disease. No significant change distribution. T5 vertebra plana. Wedging at T4 and T7, unchanged. Chronic mild  superior endplate compression at L1. CT ABDOMEN PELVIS FINDINGS Hepatobiliary: Rim enhancing lesion in segment 4 of the liver adjacent to the falciform ligament demonstrates increased central hypodensity and measures about 3.0 by 2.7 cm, previously 2.2 by 2.1 cm. Rim enhancing lesion in the lateral segment left hepatic lobe measures 2.0 by 1.8 cm, previously 2.1 by 2.1. Contracted gallbladder. Stable mild extrahepatic biliary prominence with the common bile duct at about 0.9 cm. Pancreas: Unremarkable Spleen: Unremarkable Adrenals/Urinary Tract: Mild scarring in the left kidney upper pole. Adrenal glands unremarkable. Urinary bladder unremarkable. Stomach/Bowel: Mild sigmoid colon diverticulosis. Prominent stool throughout the colon favors constipation. Vascular/Lymphatic: Aortoiliac  atherosclerotic vascular disease. No pathologic adenopathy is identified. Reproductive: No supplemental non-categorized findings. Other: No supplemental non-categorized findings. Musculoskeletal: 1.1 cm of grade 2 anterolisthesis of L4 on L5 associated with a chronic right pars defect and degenerative facet arthropathy, resulting in bilateral foraminal impingement and central narrowing of the thecal sac this level. Stable scattered sclerotic osseous metastatic disease. IMPRESSION: 1. Stable size of the left upper lobe mass with adjacent postobstructive atelectasis. The other smaller lung nodules are stable. 2. Enlarging hypodense metastatic lesion in segment 4 of the liver. On the other hand, the lateral segment left hepatic lobe metastatic lesion appears mildly smaller. 3. Stable scattered sclerotic osseous metastatic disease. 4. A anterior pericardial lymph node has enlarged compared to prior, currently 0.8 cm in short axis. 5. Other imaging findings of potential clinical significance: Coronary atherosclerosis. Prominent stool throughout the colon favors constipation. Mild sigmoid colon diverticulosis. Grade 2 anterolisthesis of L4 on  L5 associated with a chronic right pars defect and degenerative facet arthropathy, resulting in bilateral foraminal impingement and central narrowing of the thecal sac. Compression fractures in the thoracic and upper lumbar spine as before, including vertebra plana at the T5 level. Aortic Atherosclerosis (ICD10-I70.0) and Emphysema (ICD10-J43.9). Electronically Signed   By: Van Clines M.D.   On: 03/22/2019 11:18   DG PAIN CLINIC C-ARM 1-60 MIN NO REPORT  Result Date: 03/26/2019 Fluoro was used, but no Radiologist interpretation will be provided. Please refer to "NOTES" tab for provider progress note.   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  PLAN:  1. 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  Patient then received maintenance Tecentriq from Jun 21, 2017 through November 15, 2017.  CT scan results from December 26, 2018 reviewed independently with essentially stable disease.  Previously, hospice was discussed, patient wished to pursue aggressive treatment.  She was initially scheduled to get topotecan on days 1 through 5 every 21 days, but given her persistent thrombocytopenia she can only receive treatment every 28 days.  CT scan results from March 22, 2019 reviewed independently and reported as above with one of her dominant liver lesions progressing, but otherwise stable or decreased disease elsewhere.  Plan to discuss with interventional radiology if embolization or ablation is possible for this 1 area.  In the meantime, will proceed with cycle 17, day 2 of dose reduced topotecan today.  Patient missed day 1 secondary to worsening neuropathy.  Return to clinic Wednesday through Friday for topotecan only.  Return to clinic in 4 weeks for further evaluation and consideration of cycle 18. 2. Pain: Chronic and  unchanged.  Continue MS Contin to 30 mg every 8 hours.  Patient was given a refill today.  She also has an appointment in pain clinic today for a spinal injection. 3. Thrombocytopenia: Resolved.  Proceed with treatment every 28 days as above. 4.  Anemia: Hemoglobin improved to 10.6. 5.  COPD: Chronic and unchanged.  Continue outpatient palliative care.  Continue current medications as prescribed. 6.  Hypokalemia: Resolved.  Continue oral potassium supplementation. 7.  Disposition: Hospice and end-of-life care were previously discussed, but patient wishes to continue aggressive treatments.  She expressed understanding that her treatment options are limited.  She confirms that she is a DNR and is having outpatient palliative care visit. 8.  Tobacco use: Patient states she recently started  smoking again.  She expressed understanding that this may increase her risk of recurrence.  She also recently started Chantix in order to quit again.   Patient expressed understanding and was in agreement with this plan. She also understands that She can call clinic at any time with any questions, concerns, or complaints.   Cancer Staging Small cell lung cancer [facility]) Staging form: Lung, AJCC 8th Edition - Clinical stage from 01/28/2017: Stage IV (cT4, cN3, pM1c) - Signed by Lloyd Huger, MD on 01/28/2017   Lloyd Huger, MD   04/19/2019 4:18 PM

## 2019-04-20 ENCOUNTER — Emergency Department: Payer: Medicare Other

## 2019-04-20 ENCOUNTER — Encounter: Payer: Self-pay | Admitting: Radiology

## 2019-04-20 ENCOUNTER — Inpatient Hospital Stay
Admission: EM | Admit: 2019-04-20 | Discharge: 2019-04-24 | DRG: 189 | Disposition: A | Discharge status: Hospice | Payer: Medicare Other | Attending: Internal Medicine | Admitting: Internal Medicine

## 2019-04-20 ENCOUNTER — Other Ambulatory Visit: Payer: Self-pay

## 2019-04-20 DIAGNOSIS — M1990 Unspecified osteoarthritis, unspecified site: Secondary | ICD-10-CM | POA: Diagnosis present

## 2019-04-20 DIAGNOSIS — E785 Hyperlipidemia, unspecified: Secondary | ICD-10-CM | POA: Diagnosis present

## 2019-04-20 DIAGNOSIS — Z8249 Family history of ischemic heart disease and other diseases of the circulatory system: Secondary | ICD-10-CM

## 2019-04-20 DIAGNOSIS — R7401 Elevation of levels of liver transaminase levels: Secondary | ICD-10-CM | POA: Diagnosis present

## 2019-04-20 DIAGNOSIS — Z9221 Personal history of antineoplastic chemotherapy: Secondary | ICD-10-CM | POA: Diagnosis not present

## 2019-04-20 DIAGNOSIS — S24103S Unspecified injury at T7-T10 level of thoracic spinal cord, sequela: Secondary | ICD-10-CM | POA: Diagnosis not present

## 2019-04-20 DIAGNOSIS — F329 Major depressive disorder, single episode, unspecified: Secondary | ICD-10-CM | POA: Diagnosis present

## 2019-04-20 DIAGNOSIS — J449 Chronic obstructive pulmonary disease, unspecified: Secondary | ICD-10-CM | POA: Diagnosis present

## 2019-04-20 DIAGNOSIS — R062 Wheezing: Secondary | ICD-10-CM | POA: Diagnosis not present

## 2019-04-20 DIAGNOSIS — Z9989 Dependence on other enabling machines and devices: Secondary | ICD-10-CM | POA: Diagnosis not present

## 2019-04-20 DIAGNOSIS — C787 Secondary malignant neoplasm of liver and intrahepatic bile duct: Secondary | ICD-10-CM | POA: Diagnosis present

## 2019-04-20 DIAGNOSIS — Z7401 Bed confinement status: Secondary | ICD-10-CM | POA: Diagnosis not present

## 2019-04-20 DIAGNOSIS — I428 Other cardiomyopathies: Secondary | ICD-10-CM | POA: Diagnosis present

## 2019-04-20 DIAGNOSIS — J441 Chronic obstructive pulmonary disease with (acute) exacerbation: Secondary | ICD-10-CM | POA: Diagnosis present

## 2019-04-20 DIAGNOSIS — Z7189 Other specified counseling: Secondary | ICD-10-CM | POA: Diagnosis not present

## 2019-04-20 DIAGNOSIS — M7989 Other specified soft tissue disorders: Secondary | ICD-10-CM | POA: Diagnosis not present

## 2019-04-20 DIAGNOSIS — E876 Hypokalemia: Secondary | ICD-10-CM | POA: Diagnosis present

## 2019-04-20 DIAGNOSIS — I5022 Chronic systolic (congestive) heart failure: Secondary | ICD-10-CM | POA: Diagnosis present

## 2019-04-20 DIAGNOSIS — Z20822 Contact with and (suspected) exposure to covid-19: Secondary | ICD-10-CM | POA: Diagnosis present

## 2019-04-20 DIAGNOSIS — J9621 Acute and chronic respiratory failure with hypoxia: Principal | ICD-10-CM | POA: Diagnosis present

## 2019-04-20 DIAGNOSIS — C7951 Secondary malignant neoplasm of bone: Secondary | ICD-10-CM | POA: Diagnosis present

## 2019-04-20 DIAGNOSIS — Z66 Do not resuscitate: Secondary | ICD-10-CM

## 2019-04-20 DIAGNOSIS — Z79899 Other long term (current) drug therapy: Secondary | ICD-10-CM

## 2019-04-20 DIAGNOSIS — D638 Anemia in other chronic diseases classified elsewhere: Secondary | ICD-10-CM | POA: Diagnosis present

## 2019-04-20 DIAGNOSIS — I2699 Other pulmonary embolism without acute cor pulmonale: Secondary | ICD-10-CM | POA: Diagnosis present

## 2019-04-20 DIAGNOSIS — C3490 Malignant neoplasm of unspecified part of unspecified bronchus or lung: Secondary | ICD-10-CM | POA: Diagnosis present

## 2019-04-20 DIAGNOSIS — I110 Hypertensive heart disease with heart failure: Secondary | ICD-10-CM | POA: Diagnosis present

## 2019-04-20 DIAGNOSIS — Z87891 Personal history of nicotine dependence: Secondary | ICD-10-CM

## 2019-04-20 DIAGNOSIS — Z515 Encounter for palliative care: Secondary | ICD-10-CM | POA: Diagnosis not present

## 2019-04-20 DIAGNOSIS — I509 Heart failure, unspecified: Secondary | ICD-10-CM | POA: Diagnosis not present

## 2019-04-20 DIAGNOSIS — Z885 Allergy status to narcotic agent status: Secondary | ICD-10-CM

## 2019-04-20 DIAGNOSIS — K219 Gastro-esophageal reflux disease without esophagitis: Secondary | ICD-10-CM | POA: Diagnosis present

## 2019-04-20 DIAGNOSIS — J9601 Acute respiratory failure with hypoxia: Secondary | ICD-10-CM | POA: Diagnosis present

## 2019-04-20 DIAGNOSIS — Z9981 Dependence on supplemental oxygen: Secondary | ICD-10-CM | POA: Diagnosis not present

## 2019-04-20 DIAGNOSIS — R000 Tachycardia, unspecified: Secondary | ICD-10-CM | POA: Diagnosis not present

## 2019-04-20 DIAGNOSIS — Z8701 Personal history of pneumonia (recurrent): Secondary | ICD-10-CM

## 2019-04-20 DIAGNOSIS — Z7951 Long term (current) use of inhaled steroids: Secondary | ICD-10-CM | POA: Diagnosis not present

## 2019-04-20 DIAGNOSIS — D630 Anemia in neoplastic disease: Secondary | ICD-10-CM | POA: Diagnosis not present

## 2019-04-20 DIAGNOSIS — R531 Weakness: Secondary | ICD-10-CM | POA: Diagnosis present

## 2019-04-20 DIAGNOSIS — R0602 Shortness of breath: Secondary | ICD-10-CM | POA: Diagnosis not present

## 2019-04-20 DIAGNOSIS — R0689 Other abnormalities of breathing: Secondary | ICD-10-CM | POA: Diagnosis not present

## 2019-04-20 DIAGNOSIS — C3492 Malignant neoplasm of unspecified part of left bronchus or lung: Secondary | ICD-10-CM | POA: Diagnosis present

## 2019-04-20 DIAGNOSIS — Z87892 Personal history of anaphylaxis: Secondary | ICD-10-CM

## 2019-04-20 DIAGNOSIS — Z85118 Personal history of other malignant neoplasm of bronchus and lung: Secondary | ICD-10-CM

## 2019-04-20 DIAGNOSIS — R0603 Acute respiratory distress: Secondary | ICD-10-CM | POA: Diagnosis not present

## 2019-04-20 DIAGNOSIS — Z91013 Allergy to seafood: Secondary | ICD-10-CM

## 2019-04-20 DIAGNOSIS — Z7983 Long term (current) use of bisphosphonates: Secondary | ICD-10-CM

## 2019-04-20 DIAGNOSIS — M2550 Pain in unspecified joint: Secondary | ICD-10-CM | POA: Diagnosis not present

## 2019-04-20 DIAGNOSIS — J9611 Chronic respiratory failure with hypoxia: Secondary | ICD-10-CM | POA: Diagnosis not present

## 2019-04-20 DIAGNOSIS — R069 Unspecified abnormalities of breathing: Secondary | ICD-10-CM | POA: Diagnosis not present

## 2019-04-20 DIAGNOSIS — S24104S Unspecified injury at T11-T12 level of thoracic spinal cord, sequela: Secondary | ICD-10-CM | POA: Diagnosis not present

## 2019-04-20 DIAGNOSIS — F419 Anxiety disorder, unspecified: Secondary | ICD-10-CM | POA: Diagnosis present

## 2019-04-20 DIAGNOSIS — F32A Depression, unspecified: Secondary | ICD-10-CM | POA: Diagnosis present

## 2019-04-20 LAB — CBC WITH DIFFERENTIAL/PLATELET
Abs Immature Granulocytes: 0.06 10*3/uL (ref 0.00–0.07)
Basophils Absolute: 0.1 10*3/uL (ref 0.0–0.1)
Basophils Relative: 1 %
Eosinophils Absolute: 0.1 10*3/uL (ref 0.0–0.5)
Eosinophils Relative: 1 %
HCT: 37.4 % (ref 36.0–46.0)
Hemoglobin: 10.6 g/dL — ABNORMAL LOW (ref 12.0–15.0)
Immature Granulocytes: 1 %
Lymphocytes Relative: 22 %
Lymphs Abs: 2.2 10*3/uL (ref 0.7–4.0)
MCH: 27.3 pg (ref 26.0–34.0)
MCHC: 28.3 g/dL — ABNORMAL LOW (ref 30.0–36.0)
MCV: 96.4 fL (ref 80.0–100.0)
Monocytes Absolute: 0.8 10*3/uL (ref 0.1–1.0)
Monocytes Relative: 7 %
Neutro Abs: 7 10*3/uL (ref 1.7–7.7)
Neutrophils Relative %: 68 %
Platelets: 262 10*3/uL (ref 150–400)
RBC: 3.88 MIL/uL (ref 3.87–5.11)
RDW: 20.7 % — ABNORMAL HIGH (ref 11.5–15.5)
WBC: 10.2 10*3/uL (ref 4.0–10.5)
nRBC: 0 % (ref 0.0–0.2)

## 2019-04-20 LAB — BLOOD GAS, VENOUS
Acid-Base Excess: 0.3 mmol/L (ref 0.0–2.0)
Bicarbonate: 26.5 mmol/L (ref 20.0–28.0)
Delivery systems: POSITIVE
FIO2: 35
O2 Saturation: 74.6 %
Patient temperature: 37
RATE: 12 resp/min
pCO2, Ven: 48 mmHg (ref 44.0–60.0)
pH, Ven: 7.35 (ref 7.250–7.430)
pO2, Ven: 42 mmHg (ref 32.0–45.0)

## 2019-04-20 LAB — COMPREHENSIVE METABOLIC PANEL
ALT: 55 U/L — ABNORMAL HIGH (ref 0–44)
AST: 75 U/L — ABNORMAL HIGH (ref 15–41)
Albumin: 3.4 g/dL — ABNORMAL LOW (ref 3.5–5.0)
Alkaline Phosphatase: 82 U/L (ref 38–126)
Anion gap: 13 (ref 5–15)
BUN: 8 mg/dL (ref 8–23)
CO2: 24 mmol/L (ref 22–32)
Calcium: 8.4 mg/dL — ABNORMAL LOW (ref 8.9–10.3)
Chloride: 102 mmol/L (ref 98–111)
Creatinine, Ser: 0.67 mg/dL (ref 0.44–1.00)
GFR calc Af Amer: >60 mL/min (ref >60)
GFR calc non Af Amer: >60 mL/min (ref >60)
Glucose, Bld: 110 mg/dL — ABNORMAL HIGH (ref 70–99)
Potassium: 3.1 mmol/L — ABNORMAL LOW (ref 3.5–5.1)
Sodium: 139 mmol/L (ref 135–145)
Total Bilirubin: 0.5 mg/dL (ref 0.3–1.2)
Total Protein: 7.3 g/dL (ref 6.5–8.1)

## 2019-04-20 LAB — BLOOD GAS, ARTERIAL
Acid-base deficit: 1.1 mmol/L (ref 0.0–2.0)
Allens test (pass/fail): POSITIVE — AB
Bicarbonate: 22.6 mmol/L (ref 20.0–28.0)
FIO2: 44
O2 Saturation: 96.9 %
Patient temperature: 37
pCO2 arterial: 34 mmHg (ref 32.0–48.0)
pH, Arterial: 7.43 (ref 7.350–7.450)
pO2, Arterial: 87 mmHg (ref 83.0–108.0)

## 2019-04-20 LAB — LIPASE, BLOOD: Lipase: 21 U/L (ref 11–51)

## 2019-04-20 LAB — POC SARS CORONAVIRUS 2 AG: SARS Coronavirus 2 Ag: NEGATIVE

## 2019-04-20 LAB — GLUCOSE, CAPILLARY: Glucose-Capillary: 193 mg/dL — ABNORMAL HIGH (ref 70–99)

## 2019-04-20 LAB — TROPONIN I (HIGH SENSITIVITY): Troponin I (High Sensitivity): 6 ng/L (ref <18)

## 2019-04-20 LAB — RESPIRATORY PANEL BY RT PCR (FLU A&B, COVID)
Influenza A by PCR: NEGATIVE
Influenza B by PCR: NEGATIVE
SARS Coronavirus 2 by RT PCR: NEGATIVE

## 2019-04-20 LAB — MRSA PCR SCREENING: MRSA by PCR: NEGATIVE

## 2019-04-20 MED ORDER — DULOXETINE HCL 30 MG PO CPEP
60.0000 mg | ORAL_CAPSULE | Freq: Every day | ORAL | Status: DC
  Administered 2019-04-21 – 2019-04-24 (×4): 60 mg via ORAL
  Filled 2019-04-20 (×4): qty 2

## 2019-04-20 MED ORDER — IPRATROPIUM-ALBUTEROL 0.5-2.5 (3) MG/3ML IN SOLN
3.0000 mL | RESPIRATORY_TRACT | Status: DC | PRN
  Administered 2019-04-21: 3 mL via RESPIRATORY_TRACT
  Filled 2019-04-20: qty 3

## 2019-04-20 MED ORDER — MAGNESIUM SULFATE 2 GM/50ML IV SOLN
2.0000 g | Freq: Once | INTRAVENOUS | Status: AC
  Administered 2019-04-20: 2 g via INTRAVENOUS
  Filled 2019-04-20: qty 50

## 2019-04-20 MED ORDER — PROCHLORPERAZINE MALEATE 10 MG PO TABS
10.0000 mg | ORAL_TABLET | Freq: Four times a day (QID) | ORAL | Status: DC | PRN
  Filled 2019-04-20: qty 1

## 2019-04-20 MED ORDER — PANTOPRAZOLE SODIUM 40 MG PO TBEC
40.0000 mg | DELAYED_RELEASE_TABLET | Freq: Every day | ORAL | Status: DC
  Administered 2019-04-21 – 2019-04-24 (×4): 40 mg via ORAL
  Filled 2019-04-20 (×4): qty 1

## 2019-04-20 MED ORDER — MORPHINE SULFATE ER 15 MG PO TBCR
30.0000 mg | EXTENDED_RELEASE_TABLET | Freq: Three times a day (TID) | ORAL | Status: DC
  Administered 2019-04-20 – 2019-04-24 (×8): 30 mg via ORAL
  Filled 2019-04-20 (×10): qty 2

## 2019-04-20 MED ORDER — IPRATROPIUM-ALBUTEROL 0.5-2.5 (3) MG/3ML IN SOLN
3.0000 mL | Freq: Once | RESPIRATORY_TRACT | Status: AC
  Administered 2019-04-20: 3 mL via RESPIRATORY_TRACT

## 2019-04-20 MED ORDER — ALENDRONATE SODIUM 70 MG PO TABS: 70.0000 mg | ORAL_TABLET | ORAL | Status: DC

## 2019-04-20 MED ORDER — POTASSIUM CHLORIDE IN NACL 40-0.9 MEQ/L-% IV SOLN
INTRAVENOUS | Status: DC
  Administered 2019-04-20: 50 mL/h via INTRAVENOUS
  Filled 2019-04-20 (×2): qty 1000

## 2019-04-20 MED ORDER — LORAZEPAM 1 MG PO TABS
0.5000 mg | ORAL_TABLET | ORAL | Status: DC | PRN
  Administered 2019-04-20 – 2019-04-23 (×2): 0.5 mg via ORAL
  Filled 2019-04-20 (×2): qty 1

## 2019-04-20 MED ORDER — MOMETASONE FURO-FORMOTEROL FUM 200-5 MCG/ACT IN AERO
2.0000 | INHALATION_SPRAY | Freq: Two times a day (BID) | RESPIRATORY_TRACT | Status: DC
  Administered 2019-04-20 – 2019-04-21 (×2): 2 via RESPIRATORY_TRACT
  Filled 2019-04-20: qty 8.8

## 2019-04-20 MED ORDER — METOPROLOL SUCCINATE ER 50 MG PO TB24
100.0000 mg | ORAL_TABLET | ORAL | Status: DC
  Administered 2019-04-21: 100 mg via ORAL
  Filled 2019-04-20: qty 2

## 2019-04-20 MED ORDER — ORAL CARE MOUTH RINSE: 15.0000 mL | Freq: Two times a day (BID) | OROMUCOSAL | Status: DC

## 2019-04-20 MED ORDER — IPRATROPIUM-ALBUTEROL 0.5-2.5 (3) MG/3ML IN SOLN
RESPIRATORY_TRACT | Status: AC
  Administered 2019-04-20: 3 mL via RESPIRATORY_TRACT
  Filled 2019-04-20: qty 6

## 2019-04-20 MED ORDER — GABAPENTIN 100 MG PO CAPS
100.0000 mg | ORAL_CAPSULE | Freq: Three times a day (TID) | ORAL | Status: DC
  Administered 2019-04-20 – 2019-04-24 (×6): 100 mg via ORAL
  Filled 2019-04-20 (×8): qty 1

## 2019-04-20 MED ORDER — CHLORHEXIDINE GLUCONATE 0.12 % MT SOLN
15.0000 mL | Freq: Two times a day (BID) | OROMUCOSAL | Status: DC
  Administered 2019-04-23: 15 mL via OROMUCOSAL
  Filled 2019-04-20 (×7): qty 15

## 2019-04-20 MED ORDER — ARIPIPRAZOLE 5 MG PO TABS
5.0000 mg | ORAL_TABLET | Freq: Every day | ORAL | Status: DC
  Administered 2019-04-21 – 2019-04-24 (×4): 5 mg via ORAL
  Filled 2019-04-20 (×5): qty 1

## 2019-04-20 MED ORDER — IOHEXOL 350 MG/ML SOLN
75.0000 mL | Freq: Once | INTRAVENOUS | Status: AC | PRN
  Administered 2019-04-20: 75 mL via INTRAVENOUS

## 2019-04-20 MED ORDER — LORAZEPAM 2 MG/ML IJ SOLN
0.5000 mg | Freq: Once | INTRAMUSCULAR | Status: AC
  Administered 2019-04-20: 0.5 mg via INTRAVENOUS
  Filled 2019-04-20: qty 1

## 2019-04-20 MED ORDER — SACUBITRIL-VALSARTAN 24-26 MG PO TABS
1.0000 | ORAL_TABLET | Freq: Every day | ORAL | Status: DC
  Administered 2019-04-21 – 2019-04-24 (×4): 1 via ORAL
  Filled 2019-04-20 (×5): qty 1

## 2019-04-20 MED ORDER — ENOXAPARIN SODIUM 40 MG/0.4ML ~~LOC~~ SOLN
40.0000 mg | SUBCUTANEOUS | Status: DC
  Administered 2019-04-20 – 2019-04-23 (×4): 40 mg via SUBCUTANEOUS
  Filled 2019-04-20 (×4): qty 0.4

## 2019-04-20 MED ORDER — CHLORHEXIDINE GLUCONATE CLOTH 2 % EX PADS
6.0000 | MEDICATED_PAD | Freq: Every day | CUTANEOUS | Status: DC
  Administered 2019-04-20: 6 via TOPICAL

## 2019-04-20 MED ORDER — ALBUTEROL SULFATE (2.5 MG/3ML) 0.083% IN NEBU
2.5000 mg | INHALATION_SOLUTION | Freq: Once | RESPIRATORY_TRACT | Status: AC
  Administered 2019-04-20: 2.5 mg via RESPIRATORY_TRACT
  Filled 2019-04-20: qty 3

## 2019-04-20 MED ORDER — VARENICLINE TARTRATE 1 MG PO TABS
1.0000 mg | ORAL_TABLET | Freq: Two times a day (BID) | ORAL | Status: DC
  Filled 2019-04-20 (×3): qty 1

## 2019-04-20 MED ORDER — SODIUM CHLORIDE 0.9 % IV BOLUS
500.0000 mL | Freq: Once | INTRAVENOUS | Status: AC
  Administered 2019-04-20: 500 mL via INTRAVENOUS

## 2019-04-20 MED ORDER — METHYLPREDNISOLONE SODIUM SUCC 125 MG IJ SOLR
125.0000 mg | Freq: Once | INTRAMUSCULAR | Status: AC
  Administered 2019-04-20: 125 mg via INTRAVENOUS
  Filled 2019-04-20: qty 2

## 2019-04-20 NOTE — Progress Notes (Signed)

## 2019-04-20 NOTE — ED Notes (Signed)
[place], light green, blue and grey tubes sent to lab

## 2019-04-20 NOTE — Progress Notes (Signed)
Pt taken off bipap per patients request, placed on 6lpm [place], sats 99%, respiratory rate 28/min, pt states that it feels much better on cannula. Will continue to monitor.

## 2019-04-20 NOTE — ED Triage Notes (Signed)
Pt arrives via ems from home, hx stage 4 lung cancer, woke up this am with sob, rhonchi noted per ems, co2 35, 130/76, room air sat of 89%. Pt cont to appear labored to breathe, ems gave an albuterol treatment in route with 4L of O2 as well as 125mg  soulmedrol en route

## 2019-04-20 NOTE — ED Provider Notes (Addendum)
[facility] Emergency Department Provider Note    First MD Initiated Contact with Patient 04/20/19 1219     (approximate)  I have reviewed the triage vital signs and the nursing notes.   HISTORY  Chief Complaint Shortness of Breath  Level V Caveat:  resp distress  HPI PATTYE MEDA is a 69 y.o. female [place] past medical history presents to the ER in respiratory distress.  Patient with known stage IV lung cancer currently undergoing chemotherapy presents the ER significantly tachypneic on 4 L nasal cannula.  Does not wear home oxygen.  Was given nebulizer in route with some improvement.  Does have wheezing.  Has not had any measured fevers.  No productive cough.   States that in the event that her breathing were to get worse she would not want to be placed on mechanical ventilation.  Does agree to BiPAP.   Past Medical History:  Diagnosis Date  . Anxiety   . Arthritis   . Asthma    COPD  . CAP (community acquired pneumonia) 06/30/2014  . Cardiomyopathy ([place])    nonischemic (EF 35-40%)  . CHF (congestive heart failure) ([place])   . COPD (chronic obstructive pulmonary disease) ([place])   . Depression   . Dyspnea   . GERD (gastroesophageal reflux disease)   . Hyperlipidemia   . Hypertension   . Pneumonia   . PVC's (premature ventricular contractions)    states 10 years ago  . Sepsis ([place]) 06/30/2014  . 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  . SOB (shortness of breath) 05/08/2014  . Spinal cord injury at T7-T12 level [facility])    2016   Family History  Problem Relation Age of Onset  . Heart attack Father 82       MI x 3    Past Surgical History:  Procedure Laterality Date  . APPENDECTOMY    . CARDIAC CATHETERIZATION    . CATARACT EXTRACTION     right eye   . CESAREAN SECTION    . COLONOSCOPY    . LAMINOTOMY    . LUMBAR LAMINECTOMY/DECOMPRESSION MICRODISCECTOMY Left 08/01/2016   Procedure: Left Lumbar Four-Five Laminectomy and  Foraminotomy;  Surgeon: Earnie Larsson, MD;  Location: [place];  Service: Neurosurgery;  Laterality: Left;  . PORTA CATH INSERTION N/A 02/03/2017   Procedure: PORTA CATH INSERTION;  Surgeon: Katha Cabal, MD;  Location: [place] CV LAB;  Service: Cardiovascular;  Laterality: N/A;   Patient Active Problem List   Diagnosis Date Noted  . Unspecified injury at T7-12 level of thoracic spinal cord, sequela ([place]) 04/10/2019  . Chronic lower extremity pain (Left) 03/26/2019  . Lumbar radiculitis (Left) 03/26/2019  . DDD (degenerative disc disease), lumbosacral 03/21/2019  . Chronic low back pain (Bilateral) (L>R) w/ sciatica (Bilateral) (R>L) 03/21/2019  . Abnormal MRI, lumbar spine (03/14/2016) 03/21/2019  . Abnormal MRI, thoracic spine (03/14/2016) 03/21/2019  . Lumbosacral facet arthropathy 03/21/2019  . Chronic acromioclavicular joint pain (Left) 02/25/2019  . Chronic shoulder pain (Left) 02/25/2019  . Osteoarthritis shoulder (Left) 02/25/2019  . Chronic knee pain (Left) 02/25/2019  . Osteoarthritis of knee (Left) 02/25/2019  . Chronic musculoskeletal pain 12/18/2017  . Arthropathy/Arthralgia of the shoulder (Bilateral) (R>L) 11/28/2017  . COPD (chronic obstructive pulmonary disease) ([place]) 11/07/2017  . Osteoarthritis of acromioclavicular joint (Left) 11/07/2017  . Osteoarthritis of shoulders (Bilateral) 11/07/2017  . Osteoarthritis of acromioclavicular joints (Bilateral) 11/07/2017  . Osteoarthritis glenohumeral joints (Bilateral) 11/07/2017  . Dehydration 08/14/2017  .  Hyponatremia 04/04/2017  . Small cell lung cancer ([place]) 01/28/2017  . Liver mass 01/10/2017  . Chronic acromioclavicular pain (Right) 12/27/2016  . Osteoarthritis of acromioclavicular joint (Right) 12/27/2016  . Osteoarthritis glenohumeral joint (Left) 12/27/2016  . Medial epicondylitis of elbow (Right) 12/27/2016  . Chronic shoulder pain (Bilateral) (R>L) 12/26/2016  . Chronic elbow pain (Right) 12/26/2016  .  Long term prescription benzodiazepine use 12/07/2016  . Long term prescription opiate use 12/07/2016  . Opiate use 12/07/2016  . NSAID long-term use 12/07/2016  . Osteoarthritis of knees (Bilateral) 11/22/2016  . Thoracic compression fracture (T4, T5, T6, and T7) 11/22/2016  . Breast cyst 11/21/2016  . Cardiomyopathy ([place]) 11/21/2016  . Diabetes mellitus type 2, uncomplicated ([place]) 51/88/4166  . Fibroids 11/21/2016  . Hidradenitis 11/21/2016  . Nicotine abuse 11/21/2016  . Osteoarthritis cervical spine 11/21/2016  . Osteoarthritis of hands (Bilateral) 11/21/2016  . PUD (peptic ulcer disease) 11/21/2016  . Pulmonary nodule 11/21/2016  . Chronic knee pain (Primary Area of Pain) (Bilateral) 11/21/2016  . Chronic pain syndrome 11/21/2016  . Lumbar stenosis with neurogenic claudication 08/01/2016  . Systolic dysfunction with acute on chronic heart failure ([place])   . CHF (congestive heart failure), NYHA class I ([place]) 08/22/2014  . GERD (gastroesophageal reflux disease) 06/30/2014  . Depression 06/30/2014  . PVC's (premature ventricular contractions) 05/08/2014  . 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 Obesity 05/08/2014  . Tachycardia 05/08/2014  . 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 Authorizing Provider  ADVAIR DISKUS 250-50 MCG/DOSE AEPB Inhale 1 puff into the lungs 2 (two) times daily. 04/06/17   [provider]  albuterol (PROAIR HFA) 108 (90 Base) MCG/ACT inhaler Inhale 2 puffs into the lungs every 6 (six) hours as needed for wheezing or shortness of breath.  08/09/16   [provider]  alendronate (FOSAMAX) 70 MG tablet Take 70 mg by mouth every Thursday.  06/23/16   [provider]  ARIPiprazole (ABILIFY) 5 MG tablet Take 5 mg by mouth daily.    [provider]  dextromethorphan-guaiFENesin  (MUCINEX DM) 30-600 MG 12hr tablet Take 1 tablet by mouth 2 (two) times daily as needed for cough.    [provider]  DULoxetine (CYMBALTA) 60 MG capsule Take 60 mg by mouth daily.    [provider]  gabapentin (NEURONTIN) 100 MG capsule Take 100 mg by mouth 3 (three) times daily.    [provider]  ipratropium-albuterol (DUONEB) 0.5-2.5 (3) MG/3ML SOLN Take 3 mLs by nebulization every 4 (four) hours as needed. 04/03/17   Jacquelin Hawking, NP  LORazepam (ATIVAN) 0.5 MG tablet Take 0.5 mg by mouth as needed. 04/18/17   [provider]  metoprolol succinate (TOPROL-XL) 100 MG 24 hr tablet Take 1 tablet by mouth 1 day or 1 dose. 05/10/18   [provider]  morphine (MS CONTIN) 30 MG 12 hr tablet Take 1 tablet (30 mg total) by mouth every 8 (eight) hours. 03/26/19   Lloyd Huger, MD  pantoprazole (PROTONIX) 40 MG tablet Take 1 tablet by mouth daily. 05/23/17   [provider]  potassium chloride SA (K-DUR,KLOR-CON) 20 MEQ tablet Take 1 tablet (20 mEq total) by mouth daily. 06/05/17   Lloyd Huger, MD  prochlorperazine (COMPAZINE) 10 MG tablet TAKE 1 TABLET (10 MG TOTAL) BY MOUTH EVERY 6 (SIX) HOURS AS NEEDED (NAUSEA OR VOMITING).  12/17/18   Lloyd Huger, MD  promethazine (PHENERGAN) 25 MG tablet Take 1 tablet (25 mg total) by mouth every 8 (eight) hours as needed for nausea or vomiting. 08/03/17   Lloyd Huger, MD  sacubitril-valsartan (ENTRESTO) 24-26 MG Take 1 tablet by mouth 2 (two) times daily. Patient taking differently: Take 1 tablet by mouth daily. Pt taking 5mg  10/03/17   Minna Merritts, MD  tiZANidine (ZANAFLEX) 4 MG tablet Take 1 tablet (4 mg total) by mouth every 8 (eight) hours as needed for muscle spasms. Patient not taking: Reported on 03/26/2019 12/18/17 03/26/19  Milinda Pointer, MD  varenicline (CHANTIX) 1 MG tablet Take 1 mg by mouth 2 (two) times daily.    [provider]    Allergies Fish allergy  and Fentanyl    Social History Social History   Tobacco Use  . Smoking status: Former Smoker    Packs/day: 0.50    Years: 50.00    Pack years: 25.00    Types: Cigarettes    Quit date: 01/27/2015    Years since quitting: 4.2  . Smokeless tobacco: Never Used  Substance Use Topics  . Alcohol use: No  . Drug use: No    Review of Systems Patient denies headaches, rhinorrhea, blurry vision, numbness, shortness of breath, chest pain, edema, cough, abdominal pain, nausea, vomiting, diarrhea, dysuria, fevers, rashes or hallucinations unless otherwise stated above in HPI. ____________________________________________   PHYSICAL EXAM:  VITAL SIGNS: Vitals:   04/20/19 1400 04/20/19 1506  BP: (!) 131/95 120/71  Pulse: (!) 108 (!) 108  Resp: 19 (!) 24  Temp:    SpO2: 100% 99%    Constitutional: Alert,. Weak and critically ill appearing.  Eyes: Conjunctivae are normal.  Head: Atraumatic. Nose: No congestion/rhinnorhea. Mouth/Throat: Mucous membranes are moist.   Neck: No stridor. Painless ROM.  Cardiovascular: Normal rate, regular rhythm. Grossly normal heart sounds.  Good peripheral circulation. Respiratory: tachypnea, use of accessory muscles, fain wheeze thoughout Gastrointestinal: Soft and nontender. No distention. No abdominal bruits. No CVA tenderness. Genitourinary:  Musculoskeletal: No lower extremity tenderness nor edema.  No joint effusions. Neurologic:   No gross focal neurologic deficits are appreciated. No facial droop Skin:  Skin is warm, dry and intact. No rash noted. Psychiatric: anxious  ____________________________________________   LABS (all labs ordered are listed, but only abnormal results are displayed)  Results for orders placed or performed during the hospital encounter of 04/20/19 (from the past 24 hour(s))  CBC with Differential/Platelet     Status: Abnormal   Collection Time: 04/20/19 12:32 PM  Result Value Ref Range   WBC 10.2 4.0 - 10.5 K/uL     RBC 3.88 3.87 - 5.11 MIL/uL   Hemoglobin 10.6 (L) 12.0 - 15.0 g/dL   HCT 37.4 36.0 - 46.0 %   MCV 96.4 80.0 - 100.0 fL   MCH 27.3 26.0 - 34.0 pg   MCHC 28.3 (L) 30.0 - 36.0 g/dL   RDW 20.7 (H) 11.5 - 15.5 %   Platelets 262 150 - 400 K/uL   nRBC 0.0 0.0 - 0.2 %   Neutrophils Relative % 68 %   Neutro Abs 7.0 1.7 - 7.7 K/uL   Lymphocytes Relative 22 %   Lymphs Abs 2.2 0.7 - 4.0 K/uL   Monocytes Relative 7 %   Monocytes Absolute 0.8 0.1 - 1.0 K/uL   Eosinophils Relative 1 %   Eosinophils Absolute 0.1 0.0 - 0.5 K/uL   Basophils Relative 1 %   Basophils  Absolute 0.1 0.0 - 0.1 K/uL   Immature Granulocytes 1 %   Abs Immature Granulocytes 0.06 0.00 - 0.07 K/uL  Comprehensive metabolic panel     Status: Abnormal   Collection Time: 04/20/19 12:32 PM  Result Value Ref Range   Sodium 139 135 - 145 mmol/L   Potassium 3.1 (L) 3.5 - 5.1 mmol/L   Chloride 102 98 - 111 mmol/L   CO2 24 22 - 32 mmol/L   Glucose, Bld 110 (H) 70 - 99 mg/dL   BUN 8 8 - 23 mg/dL   Creatinine, Ser 0.67 0.44 - 1.00 mg/dL   Calcium 8.4 (L) 8.9 - 10.3 mg/dL   Total Protein 7.3 6.5 - 8.1 g/dL   Albumin 3.4 (L) 3.5 - 5.0 g/dL   AST 75 (H) 15 - 41 U/L   ALT 55 (H) 0 - 44 U/L   Alkaline Phosphatase 82 38 - 126 U/L   Total Bilirubin 0.5 0.3 - 1.2 mg/dL   GFR calc non Af Amer >60 >60 mL/min   GFR calc Af Amer >60 >60 mL/min   Anion gap 13 5 - 15  Lipase, blood     Status: None   Collection Time: 04/20/19 12:32 PM  Result Value Ref Range   Lipase 21 11 - 51 U/L  Troponin I (High Sensitivity)     Status: None   Collection Time: 04/20/19 12:32 PM  Result Value Ref Range   Troponin I (High Sensitivity) 6 <18 ng/L  Respiratory Panel by RT PCR (Flu A&B, Covid) - Nasopharyngeal Swab     Status: None   Collection Time: 04/20/19 12:32 PM   Specimen: Nasopharyngeal Swab  Result Value Ref Range   SARS Coronavirus 2 by RT PCR NEGATIVE NEGATIVE   Influenza A by PCR NEGATIVE NEGATIVE   Influenza B by PCR NEGATIVE NEGATIVE   POC SARS Coronavirus 2 Ag     Status: None   Collection Time: 04/20/19  1:12 PM  Result Value Ref Range   SARS Coronavirus 2 Ag NEGATIVE NEGATIVE  Blood gas, venous     Status: None   Collection Time: 04/20/19  2:55 PM  Result Value Ref Range   FIO2 35.00    Delivery systems BILEVEL POSITIVE AIRWAY PRESSURE    LHR 12 resp/min   pH, Ven 7.35 7.250 - 7.430   pCO2, Ven 48 44.0 - 60.0 mmHg   pO2, Ven 42.0 32.0 - 45.0 mmHg   Bicarbonate 26.5 20.0 - 28.0 mmol/L   Acid-Base Excess 0.3 0.0 - 2.0 mmol/L   O2 Saturation 74.6 %   Patient temperature 37.0    Sample type VENOUS    ____________________________________________  EKG My review and personal interpretation at Time: 12:32   Indication: sob  Rate: 110  Rhythm: sinus Axis: normal Other: normal intervals, no stemi, nonspecific st abn ____________________________________________  RADIOLOGY  I personally reviewed all radiographic images ordered to evaluate for the above acute complaints and reviewed radiology reports and findings.  These findings were personally discussed with the patient.  Please see medical record for radiology report.  ____________________________________________   PROCEDURES  Procedure(s) performed:  .Critical Care Performed by: Merlyn Lot, MD Authorized by: Merlyn Lot, MD   Critical care provider statement:    Critical care time (minutes):  35   Critical care time was exclusive of:  Separately billable procedures and treating other patients   Critical care was time spent personally by me on the following activities:  Development of treatment plan with patient  or surrogate, discussions with consultants, evaluation of patient's response to treatment, examination of patient, obtaining history from patient or surrogate, ordering and performing treatments and interventions, ordering and review of laboratory studies, ordering and review of radiographic studies, pulse oximetry, re-evaluation of  patient's condition and review of old charts      Critical Care performed: yes ____________________________________________   INITIAL IMPRESSION / ASSESSMENT AND PLAN / ED COURSE  Pertinent labs & imaging results that were available during my care of the patient were reviewed by me and considered in my medical decision making (see chart for details).   DDX: Asthma, copd, CHF, pna, ptx, malignancy, Pe, anemia   Grace [name] is a 69 y.o. who presents to the ED with acute respiratory distress as described above.  Patient ill-appearing but protecting her airway.  Placed on BiPAP for increased work of breathing and respiratory distress. States she does not want to be intubated in the event of worsening resp distress. Does have diffuse faint wheezing diminished breath sounds suspect COPD but presentation is complicated by her known cancer diagnosis.  The patient will be placed on continuous pulse oximetry and telemetry for monitoring.  Laboratory evaluation will be sent to evaluate for the above complaints.     Clinical Course as of Apr 19 1544  Sat Apr 20, 2019  1404 Patient's O2 sats are stabilizing home BiPAP respiratory rate also decreasing.  No evidence of injury.  Will order CTA to exclude PE.   [PR]  1535 Patient appears to be improving clinically.  O2 sats good on bipap.  Pulling better tidal volumes.  CTA with small nonocclusive PE.  Will order dvt study prior to anticoagulating given her h/o stage IV lung cancer.  Doubt her symptoms could be explained by these pe.  More likley copd.      [PR]  1543 Chloride: 102 [PR]    Clinical Course User Index [PR] Merlyn Lot, MD   Will require admission to hospital.  The patient was evaluated in Emergency Department today for the symptoms described in the history of present illness. He/she was evaluated in the context of the global COVID-19 pandemic, which necessitated consideration that the patient might be at risk for infection  with the SARS-CoV-2 virus that causes COVID-19. Institutional protocols and algorithms that pertain to the evaluation of patients at risk for COVID-19 are in a state of rapid change based on information released by regulatory bodies including the CDC and federal and state organizations. These policies and algorithms were followed during the patient's care in the ED.  As part of my medical decision making, I reviewed the following data within the [place] notes reviewed and incorporated, Labs reviewed, notes from prior ED visits and [place] Controlled Substance Database   ____________________________________________   FINAL CLINICAL IMPRESSION(S) / ED DIAGNOSES  Final diagnoses:  Acute respiratory failure with hypoxia (HCC)  COPD exacerbation ([place])  Small cell lung cancer in adult [facility])      NEW MEDICATIONS STARTED DURING THIS VISIT:  New Prescriptions   No medications on file     Note:  This document was prepared using Dragon voice recognition software and may include unintentional dictation errors.       Merlyn Lot, MD 04/20/19 [phone]

## 2019-04-20 NOTE — ED Notes (Signed)
Respiratory called to place bipap per verbal order by Dr. Quentin Cornwall

## 2019-04-20 NOTE — ED Notes (Signed)
Patient Transported to CT.

## 2019-04-20 NOTE — ED Notes (Signed)
Patients Daughter, Trinda Pascal phone # is [phone]

## 2019-04-20 NOTE — H&P (Signed)
History and Physical    Grace [name] BTD:[id number] DOB: [birth year] DOA: 04/20/2019  PCP: [name], MD   Patient coming from: Home  I have personally briefly reviewed patient's old medical records in [place]  Chief Complaint: Shortness of breath Most of the history was obtained from patient's daughter over the phone  HPI: Grace [name] is a 69 y.o. 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  Patient was noted to be significantly tachypneic in the field and was placed on oxygen 4 L via nasal cannula.  She does not wear home oxygen. She was placed on BiPAP in the emergency room and per the emergency room doctor patient not want to be placed on mechanical ventilation in the event that her respiratory status worsens.  During my evaluation she was noted to be on BiPAP and could not participate in the history and physical.  According to the daughter patient's condition has progressively worsened over the last 1 week and has been unable to walk or transfer which she could do previously.  Unable to do review of systems due to patient's mental status   ED Course: Patient with a history of stage IV cell carcinoma of the lung was brought in by EMS for evaluation of shortness of breath is currently on noninvasive mechanical ventilation due to increased work of breathing.  She had a CT diagram of the chest which showed tiny nonocclusive pulmonary emboli.  Radiologist recommended to obtain lower extremity venous Doppler prior to initiation of anticoagulation  Review of Systems: As per HPI otherwise 10 point review of systems negative.    Past Medical History:  Diagnosis Date  . Anxiety   . Arthritis   . Asthma    COPD  . CAP  (community acquired pneumonia) 06/30/2014  . Cardiomyopathy ([place])    nonischemic (EF 35-40%)  . CHF (congestive heart failure) ([place])   . COPD (chronic obstructive pulmonary disease) ([place])   . Depression   . Dyspnea   . GERD (gastroesophageal reflux disease)   . Hyperlipidemia   . Hypertension   . Pneumonia   . PVC's (premature ventricular contractions)    states 10 years ago  . Sepsis ([place]) 06/30/2014  . Small cell lung cancer ([place]) 12/2016   Chemo tx's.  . SOB (shortness of breath) 05/08/2014  . Spinal cord injury at T7-T12 level [facility])    2016    Past Surgical History:  Procedure Laterality Date  . APPENDECTOMY    . CARDIAC CATHETERIZATION    . CATARACT EXTRACTION     right eye   . CESAREAN SECTION    . COLONOSCOPY    . LAMINOTOMY    . LUMBAR LAMINECTOMY/DECOMPRESSION MICRODISCECTOMY Left 08/01/2016   Procedure: Left Lumbar Four-Five Laminectomy and Foraminotomy;  Surgeon: Earnie Larsson, MD;  Location: [facility];  Service: Neurosurgery;  Laterality: Left;  . PORTA CATH INSERTION N/A 02/03/2017   Procedure: PORTA CATH INSERTION;  Surgeon: Katha Cabal, MD;  Location: [facility];  Service: Cardiovascular;  Laterality: N/A;     reports that she quit smoking about 4 years ago. Her smoking use included cigarettes. She has a 25.00 pack-year smoking history. She has never used smokeless tobacco. She reports that she does not drink  alcohol or use drugs.  Allergies  Allergen Reactions  . Fish Allergy Anaphylaxis    Per pt after eating [facility] she had throat swelling and SOB. MSY  Per pt after eating [facility] she had throat swelling and SOB. MSY   . Fentanyl     Overly sedated and groggy, ineffective     Family History  Problem Relation Age of Onset  . Heart attack Father 57       MI x 3      Prior to Admission medications   Medication Sig Start Date End Date Taking? Authorizing Provider  ADVAIR DISKUS 250-50 MCG/DOSE AEPB Inhale 1 puff into the lungs 2 (two)  times daily. 04/06/17   [provider]  albuterol (PROAIR HFA) 108 (90 Base) MCG/ACT inhaler Inhale 2 puffs into the lungs every 6 (six) hours as needed for wheezing or shortness of breath.  08/09/16   [provider]  alendronate (FOSAMAX) 70 MG tablet Take 70 mg by mouth every Thursday.  06/23/16   [provider]  ARIPiprazole (ABILIFY) 5 MG tablet Take 5 mg by mouth daily.    [provider]  dextromethorphan-guaiFENesin (MUCINEX DM) 30-600 MG 12hr tablet Take 1 tablet by mouth 2 (two) times daily as needed for cough.    [provider]  DULoxetine (CYMBALTA) 60 MG capsule Take 60 mg by mouth daily.    [provider]  gabapentin (NEURONTIN) 100 MG capsule Take 100 mg by mouth 3 (three) times daily.    [provider]  ipratropium-albuterol (DUONEB) 0.5-2.5 (3) MG/3ML SOLN Take 3 mLs by nebulization every 4 (four) hours as needed. 04/03/17   Jacquelin Hawking, NP  LORazepam (ATIVAN) 0.5 MG tablet Take 0.5 mg by mouth as needed. 04/18/17   [provider]  metoprolol succinate (TOPROL-XL) 100 MG 24 hr tablet Take 1 tablet by mouth 1 day or 1 dose. 05/10/18   [provider]  morphine (MS CONTIN) 30 MG 12 hr tablet Take 1 tablet (30 mg total) by mouth every 8 (eight) hours. 03/26/19   Lloyd Huger, MD  pantoprazole (PROTONIX) 40 MG tablet Take 1 tablet by mouth daily. 05/23/17   [provider]  potassium chloride SA (K-DUR,KLOR-CON) 20 MEQ tablet Take 1 tablet (20 mEq total) by mouth daily. 06/05/17   Lloyd Huger, MD  prochlorperazine (COMPAZINE) 10 MG tablet TAKE 1 TABLET (10 MG TOTAL) BY MOUTH EVERY 6 (SIX) HOURS AS NEEDED (NAUSEA OR VOMITING). 12/17/18   Lloyd Huger, MD  promethazine (PHENERGAN) 25 MG tablet Take 1 tablet (25 mg total) by mouth every 8 (eight) hours as needed for nausea or vomiting. 08/03/17   Lloyd Huger, MD  sacubitril-valsartan (ENTRESTO) 24-26 MG Take 1 tablet by mouth  2 (two) times daily. Patient taking differently: Take 1 tablet by mouth daily. Pt taking 5mg  10/03/17   Minna Merritts, MD  tiZANidine (ZANAFLEX) 4 MG tablet Take 1 tablet (4 mg total) by mouth every 8 (eight) hours as needed for muscle spasms. Patient not taking: Reported on 03/26/2019 12/18/17 03/26/19  Milinda Pointer, MD  varenicline (CHANTIX) 1 MG tablet Take 1 mg by mouth 2 (two) times daily.    [provider]    Physical Exam: Vitals:   04/20/19 1530 04/20/19 1600 04/20/19 1630 04/20/19 1700  BP: 116/73 119/75 135/76 128/84  Pulse: (!) 106 (!) 104 (!) 104 (!) 105  Resp: (!) 21 19 19  (!) 26  Temp:  TempSrc:      SpO2: 98% 93% 93% 97%  Weight:      Height:         Vitals:   04/20/19 1530 04/20/19 1600 04/20/19 1630 04/20/19 1700  BP: 116/73 119/75 135/76 128/84  Pulse: (!) 106 (!) 104 (!) 104 (!) 105  Resp: (!) 21 19 19  (!) 26  Temp:      TempSrc:      SpO2: 98% 93% 93% 97%  Weight:      Height:        Constitutional: Chronically ill appearing, opens eyes to verbal stimuli Eyes: PERRL, lids and conjunctivae pallor ENMT: Mucous membranes are moist.  Neck: normal, supple, no masses, no thyromegaly Respiratory: Air entry in both lung fields, no wheezing, no crackles. Normal respiratory effort. accessory muscle use.  Cardiovascular: Tachycardia, no murmurs / rubs / gallops. No extremity edema. 2+ pedal pulses. No carotid bruits.  Abdomen: no tenderness, no masses palpated. No hepatosplenomegaly. Bowel sounds positive.  Musculoskeletal: no clubbing / cyanosis. No joint deformity upper and lower extremities.  Skin: no rashes, lesions, ulcers.  Neurologic: No gross focal neurologic deficit. Psychiatric: Normal mood and affect.   Labs on Admission: I have personally reviewed following labs and imaging studies  CBC: Recent Labs  Lab 04/20/19 1232  WBC 10.2  NEUTROABS 7.0  HGB 10.6*  HCT 37.4  MCV 96.4  PLT 725   Basic Metabolic Panel: Recent Labs    Lab 04/20/19 1232  NA 139  K 3.1*  CL 102  CO2 24  GLUCOSE 110*  BUN 8  CREATININE 0.67  CALCIUM 8.4*   GFR: Estimated Creatinine Clearance: 75.7 mL/min (by C-G formula based on SCr of 0.67 mg/dL). Liver Function Tests: Recent Labs  Lab 04/20/19 1232  AST 75*  ALT 55*  ALKPHOS 82  BILITOT 0.5  PROT 7.3  ALBUMIN 3.4*   Recent Labs  Lab 04/20/19 1232  LIPASE 21   No results for input(s): AMMONIA in the last 168 hours. Coagulation Profile: No results for input(s): INR, PROTIME in the last 168 hours. Cardiac Enzymes: No results for input(s): CKTOTAL, CKMB, CKMBINDEX, TROPONINI in the last 168 hours. BNP (last 3 results) No results for input(s): PROBNP in the last 8760 hours. HbA1C: No results for input(s): HGBA1C in the last 72 hours. CBG: No results for input(s): GLUCAP in the last 168 hours. Lipid Profile: No results for input(s): CHOL, HDL, LDLCALC, TRIG, CHOLHDL, LDLDIRECT in the last 72 hours. Thyroid Function Tests: No results for input(s): TSH, T4TOTAL, FREET4, T3FREE, THYROIDAB in the last 72 hours. Anemia Panel: No results for input(s): VITAMINB12, FOLATE, FERRITIN, TIBC, IRON, RETICCTPCT in the last 72 hours. Urine analysis:    Component Value Date/Time   COLORURINE YELLOW (A) 08/14/2017 1312   APPEARANCEUR HAZY (A) 08/14/2017 1312   APPEARANCEUR Clear 02/21/2014 0510   LABSPEC 1.015 08/14/2017 1312   LABSPEC 1.024 02/21/2014 0510   PHURINE 5.0 08/14/2017 1312   GLUCOSEU NEGATIVE 08/14/2017 1312   GLUCOSEU Negative 02/21/2014 0510   HGBUR NEGATIVE 08/14/2017 1312   BILIRUBINUR NEGATIVE 08/14/2017 1312   BILIRUBINUR Negative 02/21/2014 0510   KETONESUR NEGATIVE 08/14/2017 1312   PROTEINUR NEGATIVE 08/14/2017 1312   NITRITE NEGATIVE 08/14/2017 1312   LEUKOCYTESUR NEGATIVE 08/14/2017 1312   LEUKOCYTESUR Negative 02/21/2014 0510    Radiological Exams on Admission: CT Angio Chest PE W and/or Wo Contrast  Result Date: 04/20/2019 CLINICAL DATA:   Shortness of breath. History of lung cancer. Evaluate for pulmonary embolism.  EXAM: CT ANGIOGRAPHY CHEST WITH CONTRAST TECHNIQUE: Multidetector CT imaging of the chest was performed using the standard protocol during bolus administration of intravenous contrast. Multiplanar CT image reconstructions and MIPs were obtained to evaluate the vascular anatomy. CONTRAST:  48mL OMNIPAQUE IOHEXOL 350 MG/ML SOLN COMPARISON:  Chest radiograph-earlier same day; chest CT-03/22/2019; 12/26/2018; 10/04/2018 FINDINGS: Vascular Findings: There is adequate opacification of the pulmonary arterial system with the main pulmonary artery measuring 329 Hounsfield units. Tiny nonocclusive filling defects within two adjacent medial basilar subsegmental pulmonary arteries (representative images 46 and 47, series 4). Otherwise, no discrete filling defects are seen within the pulmonary arterial tree. Normal caliber the main pulmonary artery. Borderline cardiomegaly.  No pericardial effusion. Scattered atherosclerotic plaque within normal caliber thoracic aorta. Conventional configuration of the aortic arch. Potential hemodynamically significant narrowing involving the origin the left common carotid artery (image 17, series 4), potentially accentuated due to pulsation artifact Moderate to large amount of slightly irregular atherosclerotic plaque involving the normal caliber descending thoracic aorta, not resulting in a hemodynamically significant stenosis. Review of the MIP images confirms the above findings. ---------------------------------------------------------------------------------- Nonvascular Findings: Mediastinum/Lymph Nodes: No bulky mediastinal, hilar axillary lymphadenopathy. Lungs/Pleura: Grossly unchanged macrolobulated mass within the left upper lobe adjacent to the major fissure measuring approximately 3.2 x 2.2 x 2.1 cm (axial image 25, series 6; coronal image 59, series 7), grossly unchanged compared to the 09/2018  examination and compatible with provided history of known malignancy. Peripheral heterogeneous opacities adjacent to the central mass are unchanged and favored to represent post obstructive atelectasis. Somewhat spiculated ill-defined nodular opacity within the right lower lobe appears similar to the 09/2018 examination, measuring 0.8 cm in greatest short axis diameter (image 38, series 6), as does the approximately 0.8 cm subpleural nodule within the left lower lobe (image 53, series 6). No focal airspace opacities.  No pleural effusion or pneumothorax. Redemonstrated advanced apical predominant centrilobular emphysematous change. Upper abdomen: Known approximately 2.6 x 2.2 cm hepatic metastasis within the subcapsular aspect the medial segment of the left lobe of the liver (image 75, series 4), is grossly unchanged given arterial phase of enhancement. Redemonstrated nodularity hepatic contour. Musculoskeletal: No acute osseous abnormalities. Redemonstrated extensive osseous metastatic disease and vertebral plana involving the T5 vertebral body. Redemonstrated healed bilateral rib fractures. IMPRESSION: 1. Examination is positive for tiny nonocclusive pulmonary embolism involving two adjacent medial basilar subsegmental pulmonary arteries without evidence of pulmonary infarction or CT evidence of right-sided heart strain. Prior to the initiation of anticoagulation for this extremely tiny volume pulmonary embolism, consideration for the acquisition of bilateral lower extremity venous Doppler ultrasound could be performed as indicated. If patient has lower extremity DVT, systemic anticoagulation may be warranted. If there is no lower extremity DVT, the risk benefit ratio of initiation of anticoagulating a patient with known stage IV lung cancer should be carefully considered. 2. Otherwise, no acute cardiopulmonary disease. 3. Stable size of known left upper lobe mass with adjacent post obstructive atelectasis.  Additional smaller pulmonary nodules are also unchanged. 4. Unchanged hepatic metastasis. 5. Stable sclerotic osseous metastatic disease including T5 vertebral plana deformity. 6. Suspected hemodynamically significant narrowing involving the origin the left common carotid artery. Further evaluation with nonemergent carotid Doppler ultrasound could be performed as indicated. 7. Aortic Atherosclerosis (ICD10-I70.0). Electronically Signed   By: Sandi Mariscal M.D.   On: 04/20/2019 15:25   US Venous Img Lower Bilateral  Result Date: 04/20/2019 CLINICAL DATA:  Leg swelling, PE EXAM: BILATERAL LOWER EXTREMITY VENOUS DOPPLER ULTRASOUND TECHNIQUE: Gray-scale sonography  with compression, as well as color and duplex ultrasound, were performed to evaluate the deep venous system(s) from the level of the common femoral vein through the popliteal and proximal calf veins. COMPARISON:  None. FINDINGS: VENOUS Normal compressibility of the common femoral, superficial femoral, and popliteal veins, as well as the visualized calf veins. Visualized portions of profunda femoral vein and great saphenous vein unremarkable. No filling defects to suggest DVT on grayscale or color Doppler imaging. Doppler waveforms show normal direction of venous flow, normal respiratory phasicity and response to augmentation. OTHER None. Limitations: none IMPRESSION: No femoropopliteal DVT nor evidence of DVT within the visualized calf veins of the bilateral lower extremities. If clinical symptoms are inconsistent or if there are persistent or worsening symptoms, further imaging (possibly involving the iliac veins) may be warranted. Electronically Signed   By: Audie Pinto M.D.   On: 04/20/2019 16:16   DG Chest Portable 1 View  Result Date: 04/20/2019 CLINICAL DATA:  Respiratory distress. EXAM: PORTABLE CHEST 1 VIEW COMPARISON:  08/14/2017 and chest CT 03/22/2019 FINDINGS: Evidence for bilateral old rib fractures with callus formation. There is a  nodular structure in the mid right chest which probably represents overlying shadows or old fracture. Heart size is within normal limits and stable. Port-A-Cath tip in the SVC. Atherosclerotic calcifications at the aortic arch. No focal airspace disease or pulmonary edema. Patient has a known lesion in left upper lobe on prior CT but this is not well characterized on this examination. Degenerative changes in the left shoulder. IMPRESSION: 1. No acute cardiopulmonary disease. Known left lung lesion is not well characterized on this chest radiograph. 2. Old bilateral rib fractures. Nodular density in the mid right chest probably represents overlying structures. Electronically Signed   By: Markus Daft M.D.   On: 04/20/2019 13:33    EKG: Independently reviewed.  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)   Depression   Small cell lung cancer (HCC)   COPD (chronic obstructive pulmonary disease) (HCC)   Hypokalemia   Transaminitis   Pulmonary embolism (HCC)   Anemia of chronic disorder   Acute respiratory failure with hypoxia (HCC)    Acute on chronic respiratory failure with hypoxia Most likely secondary to worsening stage IV lung cancer Presented for evaluation of shortness of breath and was found to be hypoxic in the field requiring oxygen supplementation  She is currently on noninvasive mechanical ventilation  Stage IV small cell lung cancer With known mets to the liver and bone We will request oncology consult   Pulmonary emboli Likely related to underlying malignancy Described as small and nonocclusive Radiologist recommends to obtain lower extremity ultrasound prior to making decision regarding anticoagulation Will defer initiation of anticoagulation therapy to oncology   Hypokalemia Supplement  potassium   Transaminitis Secondary to known liver metastasis   Anemia of chronic disease Secondary to history of stage IV small cell lung cancer   COPD Continue PRN bronchodilator therapy and inhaled steroids  Depression Continue Abilify, Cymbalta and as needed lorazepam   Bilateral lower extremity weakness Will obtain MRI of the lumbar spine to evaluate lower extremity weakness to rule out metastatic disease to the lumbar spine    Chronic systolic heart failure Last known LVEF of 35 to 40% Not in an acute exacerbation Continue Entresto and metoprolol  DVT prophylaxis: Lovenox Code Status: TBD Family Communication: Greater  than 50% of time was spent discussing patient's condition and plan of care with her daughter Ms Lorine Bears over the phone. All questions and concerns have been addressed Disposition Plan: Back to previous home environment Consults called: Oncology    Collier Bullock MD [facility]     04/20/2019, 5:22 PM

## 2019-04-21 ENCOUNTER — Encounter: Payer: Self-pay | Admitting: Internal Medicine

## 2019-04-21 DIAGNOSIS — J449 Chronic obstructive pulmonary disease, unspecified: Secondary | ICD-10-CM

## 2019-04-21 DIAGNOSIS — J9621 Acute and chronic respiratory failure with hypoxia: Principal | ICD-10-CM

## 2019-04-21 LAB — CBC
HCT: 30.5 % — ABNORMAL LOW (ref 36.0–46.0)
Hemoglobin: 9.2 g/dL — ABNORMAL LOW (ref 12.0–15.0)
MCH: 27.5 pg (ref 26.0–34.0)
MCHC: 30.2 g/dL (ref 30.0–36.0)
MCV: 91 fL (ref 80.0–100.0)
Platelets: 245 10*3/uL (ref 150–400)
RBC: 3.35 MIL/uL — ABNORMAL LOW (ref 3.87–5.11)
RDW: 19.9 % — ABNORMAL HIGH (ref 11.5–15.5)
WBC: 16.7 10*3/uL — ABNORMAL HIGH (ref 4.0–10.5)
nRBC: 0 % (ref 0.0–0.2)

## 2019-04-21 LAB — BASIC METABOLIC PANEL
Anion gap: 12 (ref 5–15)
BUN: 13 mg/dL (ref 8–23)
CO2: 24 mmol/L (ref 22–32)
Calcium: 8.8 mg/dL — ABNORMAL LOW (ref 8.9–10.3)
Chloride: 101 mmol/L (ref 98–111)
Creatinine, Ser: 0.79 mg/dL (ref 0.44–1.00)
GFR calc Af Amer: >60 mL/min (ref >60)
GFR calc non Af Amer: >60 mL/min (ref >60)
Glucose, Bld: 228 mg/dL — ABNORMAL HIGH (ref 70–99)
Potassium: 3.8 mmol/L (ref 3.5–5.1)
Sodium: 137 mmol/L (ref 135–145)

## 2019-04-21 LAB — HIV ANTIBODY (ROUTINE TESTING W REFLEX): HIV Screen 4th Generation wRfx: NONREACTIVE

## 2019-04-21 MED ORDER — ALBUTEROL SULFATE (2.5 MG/3ML) 0.083% IN NEBU: 2.5000 mg | INHALATION_SOLUTION | RESPIRATORY_TRACT | Status: DC | PRN

## 2019-04-21 MED ORDER — IPRATROPIUM-ALBUTEROL 0.5-2.5 (3) MG/3ML IN SOLN
3.0000 mL | Freq: Four times a day (QID) | RESPIRATORY_TRACT | Status: DC
  Administered 2019-04-21 – 2019-04-24 (×13): 3 mL via RESPIRATORY_TRACT
  Filled 2019-04-21 (×13): qty 3

## 2019-04-21 MED ORDER — PREDNISONE 10 MG PO TABS
40.0000 mg | ORAL_TABLET | Freq: Every day | ORAL | Status: DC
  Administered 2019-04-21 – 2019-04-24 (×4): 40 mg via ORAL
  Filled 2019-04-21 (×4): qty 4

## 2019-04-21 NOTE — Progress Notes (Signed)
Patient in room with daughter at bedside.  Patient refused MRI this evening.  She is requesting medication, preferably Ativan IV for anxiety before MRI.  Patient states she is unable to lay on back during MRI as the position is extremely painful for her.  NP notified. Patient alert and oriented, sitting up in bed.Marland Kitchen HFNC in place.  Patient does not want BiPap on at this time.  Patient eating pizza and ice cream and reading.  No acute concerns at this time.

## 2019-04-21 NOTE — Consult Note (Signed)
[place]  [phone] [phone]  ID: Grace [name] OB: 12-17-1950  MR#: [id number]  FMB#:[id number]  Patient Care Team: [name], MD as PCP - General (Family Medicine) [name], RN as Registered Nurse Lloyd Huger, MD as Consulting Physician (Oncology)  CHIEF COMPLAINT: Stage IV small cell carcinoma.  Acute on chronic respiratory failure.  INTERVAL HISTORY: Patient is a 69 year old female with a longstanding history of stage IV small cell carcinoma who last received chemotherapy at the beginning of February.  Recent CT scan on March 22, 2019 revealed mildly progressive disease and one liver lesion, but otherwise was stable.  Patient states over the past week she has become progressively more short of breath with increasing weakness and fatigue.  She has a poor appetite.  She also complains of lower extremity numbness and weakness to the point where she can no longer walk.  Currently, patient is sitting up in bed eating dinner along with her daughter.  She has no other neurologic complaints.  She denies any recent fevers.  She has no chest pain or hemoptysis.  She denies any nausea, vomiting, constipation, or diarrhea.  She has no urinary complaints.  Patient offers no further specific complaints today.  REVIEW OF SYSTEMS:   Review of Systems  Constitutional: Positive for malaise/fatigue and weight loss. Negative for fever.  Respiratory: Positive for shortness of breath. Negative for cough and hemoptysis.   Cardiovascular: Negative.  Negative for chest pain and leg swelling.  Gastrointestinal: Negative.  Negative for abdominal pain.  Genitourinary: Negative.  Negative for dysuria.  Musculoskeletal: Positive for back pain.  Skin: Negative.  Negative for rash.  Neurological: Positive for focal weakness and weakness. Negative for dizziness and headaches.  Psychiatric/Behavioral: Negative.  The patient is not nervous/anxious.      As per HPI. Otherwise, a complete review of systems is negative.  PAST MEDICAL HISTORY: Past Medical History:  Diagnosis Date  . Anxiety   . Arthritis   . Asthma    COPD  . CAP (community acquired pneumonia) 06/30/2014  . Cardiomyopathy ([place])    nonischemic (EF 35-40%)  . CHF (congestive heart failure) ([place])   . COPD (chronic obstructive pulmonary disease) ([place])   . Depression   . Dyspnea   . GERD (gastroesophageal reflux disease)   . Hyperlipidemia   . Hypertension   . Pneumonia   . PVC's (premature ventricular contractions)    states 10 years ago  . Sepsis ([place]) 06/30/2014  . Small cell lung cancer ([place]) 12/2016   Chemo tx's.  . SOB (shortness of breath) 05/08/2014  . Spinal cord injury at T7-T12 level [facility])    2016    PAST SURGICAL HISTORY: Past Surgical History:  Procedure Laterality Date  . APPENDECTOMY    . CARDIAC CATHETERIZATION    . CATARACT EXTRACTION     right eye   . CESAREAN SECTION    . COLONOSCOPY    . LAMINOTOMY    . LUMBAR LAMINECTOMY/DECOMPRESSION MICRODISCECTOMY Left 08/01/2016   Procedure: Left Lumbar Four-Five Laminectomy and Foraminotomy;  Surgeon: Earnie Larsson, MD;  Location: [facility];  Service: Neurosurgery;  Laterality: Left;  . PORTA CATH INSERTION N/A 02/03/2017   Procedure: PORTA CATH INSERTION;  Surgeon: Katha Cabal, MD;  Location: [facility];  Service: Cardiovascular;  Laterality: N/A;    FAMILY HISTORY: Family History  Problem Relation Age of Onset  . Heart attack Father 19       MI x 3  ADVANCED DIRECTIVES (Y/N):  @ADVDIR @  HEALTH MAINTENANCE: Social History   Tobacco Use  . Smoking status: Former Smoker    Packs/day: 0.50    Years: 50.00    Pack years: 25.00    Types: Cigarettes    Quit date: 01/27/2015    Years since quitting: 4.2  . Smokeless tobacco: Never Used  Substance Use Topics  . Alcohol use: No  . Drug use: No     Colonoscopy:  PAP:  Bone density:  Lipid panel:  Allergies   Allergen Reactions  . Fish Allergy Anaphylaxis    Per pt after eating [facility] she had throat swelling and SOB. MSY  Per pt after eating [facility] she had throat swelling and SOB. MSY   . Fentanyl     Overly sedated and groggy, ineffective     Current Facility-Administered Medications  Medication Dose Route Frequency Provider Last Rate Last Admin  . albuterol (PROVENTIL) (2.5 MG/3ML) 0.083% nebulizer solution 2.5 mg  2.5 mg Nebulization Q2H PRN Bonnell Public Tublu, MD      . ARIPiprazole (ABILIFY) tablet 5 mg  5 mg Oral Daily Agbata, Tochukwu, MD   5 mg at 04/21/19 1035  . chlorhexidine (PERIDEX) 0.12 % solution 15 mL  15 mL Mouth Rinse BID Agbata, Tochukwu, MD      . Chlorhexidine Gluconate Cloth 2 % PADS 6 each  6 each Topical Daily Agbata, Tochukwu, MD   6 each at 04/20/19 1818  . DULoxetine (CYMBALTA) DR capsule 60 mg  60 mg Oral Daily Agbata, Tochukwu, MD   60 mg at 04/21/19 1036  . enoxaparin (LOVENOX) injection 40 mg  40 mg Subcutaneous Q24H Agbata, Tochukwu, MD   40 mg at 04/20/19 2241  . gabapentin (NEURONTIN) capsule 100 mg  100 mg Oral TID Agbata, Tochukwu, MD   100 mg at 04/21/19 1036  . ipratropium-albuterol (DUONEB) 0.5-2.5 (3) MG/3ML nebulizer solution 3 mL  3 mL Nebulization Q6H Bonnell Public Tublu, MD   3 mL at 04/21/19 1421  . LORazepam (ATIVAN) tablet 0.5 mg  0.5 mg Oral PRN Agbata, Tochukwu, MD   0.5 mg at 04/20/19 2240  . MEDLINE mouth rinse  15 mL Mouth Rinse q12n4p Agbata, Tochukwu, MD      . metoprolol succinate (TOPROL-XL) 24 hr tablet 100 mg  100 mg Oral 1 day or 1 dose Agbata, Tochukwu, MD      . morphine (MS CONTIN) 12 hr tablet 30 mg  30 mg Oral Q8H Agbata, Tochukwu, MD   30 mg at 04/21/19 1425  . pantoprazole (PROTONIX) EC tablet 40 mg  40 mg Oral Daily Agbata, Tochukwu, MD   40 mg at 04/21/19 1035  . predniSONE (DELTASONE) tablet 40 mg  40 mg Oral Q0600 Bonnell Public Tublu, MD   40 mg at 04/21/19 1425  . prochlorperazine (COMPAZINE) tablet  10 mg  10 mg Oral Q6H PRN Agbata, Tochukwu, MD      . sacubitril-valsartan (ENTRESTO) 24-26 mg per tablet  1 tablet Oral Daily Agbata, Tochukwu, MD   1 tablet at 04/21/19 1035   Facility-Administered Medications Ordered in Other Encounters  Medication Dose Route Frequency Provider Last Rate Last Admin  . sodium chloride flush (NS) 0.9 % injection 10 mL  10 mL Intravenous PRN Lloyd Huger, MD   10 mL at 03/01/17 0841    OBJECTIVE: Vitals:   04/21/19 1422 04/21/19 1500  BP:  129/73  Pulse:  (!) 105  Resp:  17  Temp:  SpO2: 100% 99%     Body mass index is 32.36 kg/m.    ECOG FS:3 - Symptomatic, >50% confined to bed  General: Ill-appearing, moderate respiratory distress. Eyes: Pink conjunctiva, anicteric sclera. HEENT: Normocephalic, moist mucous membranes. Lungs: No audible wheezing or coughing. Heart: Regular rate and rhythm. Abdomen: Soft, nontender, no obvious distention. Musculoskeletal: No edema, cyanosis, or clubbing. Neuro: Alert, answering all questions appropriately. Cranial nerves grossly intact. Skin: No rashes or petechiae noted. Psych: Normal affect.   LAB RESULTS:  Lab Results  Component Value Date   NA 137 04/21/2019   K 3.8 04/21/2019   CL 101 04/21/2019   CO2 24 04/21/2019   GLUCOSE 228 (H) 04/21/2019   BUN 13 04/21/2019   CREATININE 0.79 04/21/2019   CALCIUM 8.8 (L) 04/21/2019   PROT 7.3 04/20/2019   ALBUMIN 3.4 (L) 04/20/2019   AST 75 (H) 04/20/2019   ALT 55 (H) 04/20/2019   ALKPHOS 82 04/20/2019   BILITOT 0.5 04/20/2019   GFRNONAA >60 04/21/2019   GFRAA >60 04/21/2019    Lab Results  Component Value Date   WBC 16.7 (H) 04/21/2019   NEUTROABS 7.0 04/20/2019   HGB 9.2 (L) 04/21/2019   HCT 30.5 (L) 04/21/2019   MCV 91.0 04/21/2019   PLT 245 04/21/2019     STUDIES: CT Angio Chest PE W and/or Wo Contrast  Result Date: 04/20/2019 CLINICAL DATA:  Shortness of breath. History of lung cancer. Evaluate for pulmonary embolism. EXAM: CT  ANGIOGRAPHY CHEST WITH CONTRAST TECHNIQUE: Multidetector CT imaging of the chest was performed using the standard protocol during bolus administration of intravenous contrast. Multiplanar CT image reconstructions and MIPs were obtained to evaluate the vascular anatomy. CONTRAST:  47mL OMNIPAQUE IOHEXOL 350 MG/ML SOLN COMPARISON:  Chest radiograph-earlier same day; chest CT-03/22/2019; 12/26/2018; 10/04/2018 FINDINGS: Vascular Findings: There is adequate opacification of the pulmonary arterial system with the main pulmonary artery measuring 329 Hounsfield units. Tiny nonocclusive filling defects within two adjacent medial basilar subsegmental pulmonary arteries (representative images 46 and 47, series 4). Otherwise, no discrete filling defects are seen within the pulmonary arterial tree. Normal caliber the main pulmonary artery. Borderline cardiomegaly.  No pericardial effusion. Scattered atherosclerotic plaque within normal caliber thoracic aorta. Conventional configuration of the aortic arch. Potential hemodynamically significant narrowing involving the origin the left common carotid artery (image 17, series 4), potentially accentuated due to pulsation artifact Moderate to large amount of slightly irregular atherosclerotic plaque involving the normal caliber descending thoracic aorta, not resulting in a hemodynamically significant stenosis. Review of the MIP images confirms the above findings. ---------------------------------------------------------------------------------- Nonvascular Findings: Mediastinum/Lymph Nodes: No bulky mediastinal, hilar axillary lymphadenopathy. Lungs/Pleura: Grossly unchanged macrolobulated mass within the left upper lobe adjacent to the major fissure measuring approximately 3.2 x 2.2 x 2.1 cm (axial image 25, series 6; coronal image 59, series 7), grossly unchanged compared to the 09/2018 examination and compatible with provided history of known malignancy. Peripheral heterogeneous  opacities adjacent to the central mass are unchanged and favored to represent post obstructive atelectasis. Somewhat spiculated ill-defined nodular opacity within the right lower lobe appears similar to the 09/2018 examination, measuring 0.8 cm in greatest short axis diameter (image 38, series 6), as does the approximately 0.8 cm subpleural nodule within the left lower lobe (image 53, series 6). No focal airspace opacities.  No pleural effusion or pneumothorax. Redemonstrated advanced apical predominant centrilobular emphysematous change. Upper abdomen: Known approximately 2.6 x 2.2 cm hepatic metastasis within the subcapsular aspect the medial segment of the left lobe  of the liver (image 75, series 4), is grossly unchanged given arterial phase of enhancement. Redemonstrated nodularity hepatic contour. Musculoskeletal: No acute osseous abnormalities. Redemonstrated extensive osseous metastatic disease and vertebral plana involving the T5 vertebral body. Redemonstrated healed bilateral rib fractures. IMPRESSION: 1. Examination is positive for tiny nonocclusive pulmonary embolism involving two adjacent medial basilar subsegmental pulmonary arteries without evidence of pulmonary infarction or CT evidence of right-sided heart strain. Prior to the initiation of anticoagulation for this extremely tiny volume pulmonary embolism, consideration for the acquisition of bilateral lower extremity venous Doppler ultrasound could be performed as indicated. If patient has lower extremity DVT, systemic anticoagulation may be warranted. If there is no lower extremity DVT, the risk benefit ratio of initiation of anticoagulating a patient with known stage IV lung cancer should be carefully considered. 2. Otherwise, no acute cardiopulmonary disease. 3. Stable size of known left upper lobe mass with adjacent post obstructive atelectasis. Additional smaller pulmonary nodules are also unchanged. 4. Unchanged hepatic metastasis. 5. Stable  sclerotic osseous metastatic disease including T5 vertebral plana deformity. 6. Suspected hemodynamically significant narrowing involving the origin the left common carotid artery. Further evaluation with nonemergent carotid Doppler ultrasound could be performed as indicated. 7. Aortic Atherosclerosis (ICD10-I70.0). Electronically Signed   By: Sandi Mariscal M.D.   On: 04/20/2019 15:25   US Venous Img Lower Bilateral  Result Date: 04/20/2019 CLINICAL DATA:  Leg swelling, PE EXAM: BILATERAL LOWER EXTREMITY VENOUS DOPPLER ULTRASOUND TECHNIQUE: Gray-scale sonography with compression, as well as color and duplex ultrasound, were performed to evaluate the deep venous system(s) from the level of the common femoral vein through the popliteal and proximal calf veins. COMPARISON:  None. FINDINGS: VENOUS Normal compressibility of the common femoral, superficial femoral, and popliteal veins, as well as the visualized calf veins. Visualized portions of profunda femoral vein and great saphenous vein unremarkable. No filling defects to suggest DVT on grayscale or color Doppler imaging. Doppler waveforms show normal direction of venous flow, normal respiratory phasicity and response to augmentation. OTHER None. Limitations: none IMPRESSION: No femoropopliteal DVT nor evidence of DVT within the visualized calf veins of the bilateral lower extremities. If clinical symptoms are inconsistent or if there are persistent or worsening symptoms, further imaging (possibly involving the iliac veins) may be warranted. Electronically Signed   By: Audie Pinto M.D.   On: 04/20/2019 16:16   DG Chest Portable 1 View  Result Date: 04/20/2019 CLINICAL DATA:  Respiratory distress. EXAM: PORTABLE CHEST 1 VIEW COMPARISON:  08/14/2017 and chest CT 03/22/2019 FINDINGS: Evidence for bilateral old rib fractures with callus formation. There is a nodular structure in the mid right chest which probably represents overlying shadows or old fracture.  Heart size is within normal limits and stable. Port-A-Cath tip in the SVC. Atherosclerotic calcifications at the aortic arch. No focal airspace disease or pulmonary edema. Patient has a known lesion in left upper lobe on prior CT but this is not well characterized on this examination. Degenerative changes in the left shoulder. IMPRESSION: 1. No acute cardiopulmonary disease. Known left lung lesion is not well characterized on this chest radiograph. 2. Old bilateral rib fractures. Nodular density in the mid right chest probably represents overlying structures. Electronically Signed   By: Markus Daft M.D.   On: 04/20/2019 13:33   DG PAIN CLINIC C-ARM 1-60 MIN NO REPORT  Result Date: 03/26/2019 Fluoro was used, but no Radiologist interpretation will be provided. Please refer to "NOTES" tab for provider progress note.   ASSESSMENT: Stage IV  small cell carcinoma.  Acute on chronic respiratory failure.  PLAN:    1. 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  Patient then received maintenance Tecentriq from Jun 21, 2017 through November 15, 2017.  CT scan results from March 22, 2019 reviewed independently with one of her dominant liver lesions progressing, but otherwise stable or decreased disease elsewhere.  We discussed the possibility of embolization with interventional radiology, but patient has not yet had this consultation.  She last received single agent topotecan approximately 4 weeks ago.  Her next scheduled treatment is for tomorrow, but will delay this 1 week given her tenuous respiratory status.  Will consider PET scan as an outpatient to further assess her disease.  CT scan yesterday does not appear to have progressive disease causing her declining respiratory status. 2.  Acute on chronic respiratory failure: Improving, although patient still has an  oxygen requirement.  CT scan revealed small pulmonary emboli, but these are likely not the cause of her change in respiratory status.  Patient also does not appear to have progressive disease in her chest as above.  Patient has baseline poor functional reserve and this may be secondary to a COPD exacerbation.  Continue current treatment as planned. 3.  Pulmonary emboli: Lower extremity ultrasound negative for DVT.  Recommend no anticoagulation at this time. 4.  Lower extremity weakness: Unclear etiology.  Patient does not appear to have any recent imaging/MRI of her lumbar spine.  She reports a recent consultation with her neurosurgeon but no etiology was determined at that time.  Consider MRI of lumbar spine while inpatient. 5.  Goals of care: We briefly discussed how aggressively she wishes to pursue additional treatment, but we did not specifically discuss hospice.  Will consult palliative care APP, Josh Borders, in AM.  Appreciate consult, will follow.    Lloyd Huger, MD   04/21/2019 3:39 PM

## 2019-04-21 NOTE — Progress Notes (Signed)
Pt has remained alert and oriented x 4 with c/o achy, lower back pain 7/10 -> sched morphine given.  Pt has remained in NSR on cardiac monitor, BP/HR WNL.  Pt has remained on 5LNC-> SpO2 100%, RR 15-20. Lung sounds clear/diminished to auscultation.  Of note: Pt was weaned to [facility] early this am followed by c/o SOB and increased WOB-> vitals remained stable, lung sounds clear/diminished to auscultation ->pt then placed on previous 5LNC and given duoneb x1. Daughter has been updated and remained at bedside.

## 2019-04-21 NOTE — Progress Notes (Signed)
PROGRESS NOTE    Grace [name]  ERD:[id number]  DOB: 02-23-1950  DOA: 04/20/2019 PCP: [name], MD Outpatient Specialists:   Hospital course:  Patient is a 70 year old female, previously nurse in the ICU here, with stage IV SCLCa, COPD, nonischemic cardiomyopathy with HFrEF was brought into the ED 04/20/2019 with increasing shortness of breath for 5 days.  In the ED she was noted to be an extremis and declined mechanical ventilation.  She was placed on BiPAP.  CTA showed tiny nonocclusive pulmonary emboli.  Lower extremity ultrasound were negative for DVT.  She was weaned down off BiPAP and onto high flow oxygen over the course of the night.  Subjective:  Patient states that she is feeling much better, still having some difficulty breathing but feels much better than she has in a week.  No further complaints.  Patient states she does not want to undergo a lumbar MRI that was ordered yesterday.  We will discuss her lower extremity complaints with her oncologist.  Objective: Vitals:   04/21/19 1500 04/21/19 1600 04/21/19 1700 04/21/19 1751  BP: 129/73 117/75 123/72 123/72  Pulse: (!) 105 98 (!) 103 99  Resp: 17 10 11    Temp:      TempSrc:      SpO2: 99% 99% 99%   Weight:      Height:        Intake/Output Summary (Last 24 hours) at 04/21/2019 1821 Last data filed at 04/21/2019 1700 Gross per 24 hour  Intake 2503.21 ml  Output 300 ml  Net 2203.21 ml   Filed Weights   04/20/19 1227 04/20/19 1813  Weight: 92.5 kg 88.2 kg     Assessment & Plan:   69 year old female, former nurse in the ICU here, with stage IV SCLC presents with acute hypoxic respiratory failure most likely secondary to COPD flare.  Has nonobstructive pulmonary emboli and negative DVT study.  Not on anticoagulation.   Acute on chronic hypoxic respiratory failure I suspect this is most likely a COPD flare complicating stage IV SCLC  Will start patient on prednisone 50 daily, add duo nebs every 6 standing  with as needed albuterol inhaler Continue oxygen as needed  Pulmonary emboli Nonocclusive, DVT study negative No need for further anticoagulation at present  Bilateral lower extremity weakness Patient declined lumbar MRI for further work-up She will follow up with her oncologist per her request  Hypokalemia Repleted and normalized  Leukocytosis No evidence for infection at present Follow  HFrEF Continue metoprolol and Entresto Not acutely decompensated at present  Depression Continue Abilify and Cymbalta   DVT prophylaxis: Lovenox Code Status: Partial, DNI Family Communication: Patient states that she is in contact with her daughter Disposition Plan:   Patient is from: Home  Anticipated Discharge Location: Home  Barriers to Discharge: Ongoing hypoxia  Is patient medically stable for Discharge: No   Consultants:  Oncology Dr. Grayland Ormond  Procedures:  None  Antimicrobials:  None   Exam:  General: Patient with loss of hair and barrel chest sitting up in stretcher with tachypnea and mildly labored breathing Eyes: sclera anicteric, conjuctiva mild injection bilaterally CVS: S1-S2, regular  Respiratory:  decreased air entry bilaterally, no wheezes noted GI: NABS, soft, NT  LE: No edema.  Neuro: A/O x 3, Moving all extremities equally with normal strength, CN 3-12 intact, grossly nonfocal.  Psych: patient is logical and coherent, judgement and insight appear normal, mood and affect seem quite depressed  Data Reviewed: Basic Metabolic Panel: Recent Labs  Lab 04/20/19 1232 04/21/19 0706  NA 139 137  K 3.1* 3.8  CL 102 101  CO2 24 24  GLUCOSE 110* 228*  BUN 8 13  CREATININE 0.67 0.79  CALCIUM 8.4* 8.8*   Liver Function Tests: Recent Labs  Lab 04/20/19 1232  AST 75*  ALT 55*  ALKPHOS 82  BILITOT 0.5  PROT 7.3  ALBUMIN 3.4*   Recent Labs  Lab 04/20/19 1232  LIPASE 21   No results for input(s): AMMONIA in the last 168 hours. CBC: Recent  Labs  Lab 04/20/19 1232 04/21/19 0706  WBC 10.2 16.7*  NEUTROABS 7.0  --   HGB 10.6* 9.2*  HCT 37.4 30.5*  MCV 96.4 91.0  PLT 262 245   Cardiac Enzymes: No results for input(s): CKTOTAL, CKMB, CKMBINDEX, TROPONINI in the last 168 hours. BNP (last 3 results) No results for input(s): PROBNP in the last 8760 hours. CBG: Recent Labs  Lab 04/20/19 1816  GLUCAP 193*    Recent Results (from the past 240 hour(s))  Respiratory Panel by RT PCR (Flu A&B, Covid) - Nasopharyngeal Swab     Status: None   Collection Time: 04/20/19 12:32 PM   Specimen: Nasopharyngeal Swab  Result Value Ref Range Status   SARS Coronavirus 2 by RT PCR NEGATIVE NEGATIVE Final    Comment: (NOTE) SARS-CoV-2 target nucleic acids are NOT DETECTED. The SARS-CoV-2 RNA is generally detectable in upper respiratoy specimens during the acute phase of infection. The lowest concentration of SARS-CoV-2 viral copies this assay can detect is 131 copies/mL. A negative result does not preclude SARS-Cov-2 infection and should not be used as the sole basis for treatment or other patient management decisions. A negative result may occur with  improper specimen collection/handling, submission of specimen other than nasopharyngeal swab, presence of viral mutation(s) within the areas targeted by this assay, and inadequate number of viral copies (<131 copies/mL). A negative result must be combined with clinical observations, patient history, and epidemiological information. The expected result is Negative. Fact Sheet for Patients:  PinkCheek.be Fact Sheet for Healthcare Providers:  GravelBags.it This test is not yet ap proved or cleared by the [place] FDA and  has been authorized for detection and/or diagnosis of SARS-CoV-2 by FDA under an Emergency Use Authorization (EUA). This EUA will remain  in effect (meaning this test can be used) for the duration of  the COVID-19 declaration under Section 564(b)(1) of the Act, 21 U.S.C. section 360bbb-3(b)(1), unless the authorization is terminated or revoked sooner.    Influenza A by PCR NEGATIVE NEGATIVE Final   Influenza B by PCR NEGATIVE NEGATIVE Final    Comment: (NOTE) The Xpert Xpress SARS-CoV-2/FLU/RSV assay is intended as an aid in  the diagnosis of influenza from Nasopharyngeal swab specimens and  should not be used as a sole basis for treatment. Nasal washings and  aspirates are unacceptable for Xpert Xpress SARS-CoV-2/FLU/RSV  testing. Fact Sheet for Patients: PinkCheek.be Fact Sheet for Healthcare Providers: GravelBags.it This test is not yet approved or cleared by the [place] FDA and  has been authorized for detection and/or diagnosis of SARS-CoV-2 by  FDA under an Emergency Use Authorization (EUA). This EUA will remain  in effect (meaning this test can be used) for the duration of the  Covid-19 declaration under Section 564(b)(1) of the Act, 21  U.S.C. section 360bbb-3(b)(1), unless the authorization is  terminated or revoked. Performed at [facility], [address], [place], [place] [address]   MRSA PCR Screening  Status: None   Collection Time: 04/20/19  6:16 PM   Specimen: Nasal Mucosa; Nasopharyngeal  Result Value Ref Range Status   MRSA by PCR NEGATIVE NEGATIVE Final    Comment:        The GeneXpert MRSA Assay (FDA approved for NASAL specimens only), is one component of a comprehensive MRSA colonization surveillance program. It is not intended to diagnose MRSA infection nor to guide or monitor treatment for MRSA infections. Performed at [facility], [address], [place], [place] [address]       Studies: CT Angio Chest PE W and/or Wo Contrast  Result Date: 04/20/2019 CLINICAL DATA:  Shortness of breath. History of lung cancer. Evaluate for pulmonary embolism. EXAM: CT  ANGIOGRAPHY CHEST WITH CONTRAST TECHNIQUE: Multidetector CT imaging of the chest was performed using the standard protocol during bolus administration of intravenous contrast. Multiplanar CT image reconstructions and MIPs were obtained to evaluate the vascular anatomy. CONTRAST:  17mL OMNIPAQUE IOHEXOL 350 MG/ML SOLN COMPARISON:  Chest radiograph-earlier same day; chest CT-03/22/2019; 12/26/2018; 10/04/2018 FINDINGS: Vascular Findings: There is adequate opacification of the pulmonary arterial system with the main pulmonary artery measuring 329 Hounsfield units. Tiny nonocclusive filling defects within two adjacent medial basilar subsegmental pulmonary arteries (representative images 46 and 47, series 4). Otherwise, no discrete filling defects are seen within the pulmonary arterial tree. Normal caliber the main pulmonary artery. Borderline cardiomegaly.  No pericardial effusion. Scattered atherosclerotic plaque within normal caliber thoracic aorta. Conventional configuration of the aortic arch. Potential hemodynamically significant narrowing involving the origin the left common carotid artery (image 17, series 4), potentially accentuated due to pulsation artifact Moderate to large amount of slightly irregular atherosclerotic plaque involving the normal caliber descending thoracic aorta, not resulting in a hemodynamically significant stenosis. Review of the MIP images confirms the above findings. ---------------------------------------------------------------------------------- Nonvascular Findings: Mediastinum/Lymph Nodes: No bulky mediastinal, hilar axillary lymphadenopathy. Lungs/Pleura: Grossly unchanged macrolobulated mass within the left upper lobe adjacent to the major fissure measuring approximately 3.2 x 2.2 x 2.1 cm (axial image 25, series 6; coronal image 59, series 7), grossly unchanged compared to the 09/2018 examination and compatible with provided history of known malignancy. Peripheral heterogeneous  opacities adjacent to the central mass are unchanged and favored to represent post obstructive atelectasis. Somewhat spiculated ill-defined nodular opacity within the right lower lobe appears similar to the 09/2018 examination, measuring 0.8 cm in greatest short axis diameter (image 38, series 6), as does the approximately 0.8 cm subpleural nodule within the left lower lobe (image 53, series 6). No focal airspace opacities.  No pleural effusion or pneumothorax. Redemonstrated advanced apical predominant centrilobular emphysematous change. Upper abdomen: Known approximately 2.6 x 2.2 cm hepatic metastasis within the subcapsular aspect the medial segment of the left lobe of the liver (image 75, series 4), is grossly unchanged given arterial phase of enhancement. Redemonstrated nodularity hepatic contour. Musculoskeletal: No acute osseous abnormalities. Redemonstrated extensive osseous metastatic disease and vertebral plana involving the T5 vertebral body. Redemonstrated healed bilateral rib fractures. IMPRESSION: 1. Examination is positive for tiny nonocclusive pulmonary embolism involving two adjacent medial basilar subsegmental pulmonary arteries without evidence of pulmonary infarction or CT evidence of right-sided heart strain. Prior to the initiation of anticoagulation for this extremely tiny volume pulmonary embolism, consideration for the acquisition of bilateral lower extremity venous Doppler ultrasound could be performed as indicated. If patient has lower extremity DVT, systemic anticoagulation may be warranted. If there is no lower extremity DVT, the risk benefit ratio of initiation of anticoagulating a  patient with known stage IV lung cancer should be carefully considered. 2. Otherwise, no acute cardiopulmonary disease. 3. Stable size of known left upper lobe mass with adjacent post obstructive atelectasis. Additional smaller pulmonary nodules are also unchanged. 4. Unchanged hepatic metastasis. 5. Stable  sclerotic osseous metastatic disease including T5 vertebral plana deformity. 6. Suspected hemodynamically significant narrowing involving the origin the left common carotid artery. Further evaluation with nonemergent carotid Doppler ultrasound could be performed as indicated. 7. Aortic Atherosclerosis (ICD10-I70.0). Electronically Signed   By: Sandi Mariscal M.D.   On: 04/20/2019 15:25   US Venous Img Lower Bilateral  Result Date: 04/20/2019 CLINICAL DATA:  Leg swelling, PE EXAM: BILATERAL LOWER EXTREMITY VENOUS DOPPLER ULTRASOUND TECHNIQUE: Gray-scale sonography with compression, as well as color and duplex ultrasound, were performed to evaluate the deep venous system(s) from the level of the common femoral vein through the popliteal and proximal calf veins. COMPARISON:  None. FINDINGS: VENOUS Normal compressibility of the common femoral, superficial femoral, and popliteal veins, as well as the visualized calf veins. Visualized portions of profunda femoral vein and great saphenous vein unremarkable. No filling defects to suggest DVT on grayscale or color Doppler imaging. Doppler waveforms show normal direction of venous flow, normal respiratory phasicity and response to augmentation. OTHER None. Limitations: none IMPRESSION: No femoropopliteal DVT nor evidence of DVT within the visualized calf veins of the bilateral lower extremities. If clinical symptoms are inconsistent or if there are persistent or worsening symptoms, further imaging (possibly involving the iliac veins) may be warranted. Electronically Signed   By: Audie Pinto M.D.   On: 04/20/2019 16:16   DG Chest Portable 1 View  Result Date: 04/20/2019 CLINICAL DATA:  Respiratory distress. EXAM: PORTABLE CHEST 1 VIEW COMPARISON:  08/14/2017 and chest CT 03/22/2019 FINDINGS: Evidence for bilateral old rib fractures with callus formation. There is a nodular structure in the mid right chest which probably represents overlying shadows or old fracture.  Heart size is within normal limits and stable. Port-A-Cath tip in the SVC. Atherosclerotic calcifications at the aortic arch. No focal airspace disease or pulmonary edema. Patient has a known lesion in left upper lobe on prior CT but this is not well characterized on this examination. Degenerative changes in the left shoulder. IMPRESSION: 1. No acute cardiopulmonary disease. Known left lung lesion is not well characterized on this chest radiograph. 2. Old bilateral rib fractures. Nodular density in the mid right chest probably represents overlying structures. Electronically Signed   By: Markus Daft M.D.   On: 04/20/2019 13:33     Scheduled Meds: . ARIPiprazole  5 mg Oral Daily  . chlorhexidine  15 mL Mouth Rinse BID  . Chlorhexidine Gluconate Cloth  6 each Topical Daily  . DULoxetine  60 mg Oral Daily  . enoxaparin (LOVENOX) injection  40 mg Subcutaneous Q24H  . gabapentin  100 mg Oral TID  . ipratropium-albuterol  3 mL Nebulization Q6H  . mouth rinse  15 mL Mouth Rinse q12n4p  . metoprolol succinate  100 mg Oral 1 day or 1 dose  . morphine  30 mg Oral Q8H  . pantoprazole  40 mg Oral Daily  . predniSONE  40 mg Oral Q0600  . 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

## 2019-04-21 NOTE — Progress Notes (Signed)
Patient alert and sitting up in bed all night, watching television. Patient on HFNC this shift. Refused BiPap. O2 sats >95%.  Piizza at bedside, eating ad lib.  No complaints.  Meds taken and tolerated well. Patient able to make needs known. Will continue to monitor.

## 2019-04-22 ENCOUNTER — Inpatient Hospital Stay: Payer: Medicare Other

## 2019-04-22 ENCOUNTER — Inpatient Hospital Stay: Payer: Medicare Other | Admitting: Oncology

## 2019-04-22 DIAGNOSIS — Z515 Encounter for palliative care: Secondary | ICD-10-CM

## 2019-04-22 LAB — CBC
HCT: 31.1 % — ABNORMAL LOW (ref 36.0–46.0)
Hemoglobin: 9.4 g/dL — ABNORMAL LOW (ref 12.0–15.0)
MCH: 27.6 pg (ref 26.0–34.0)
MCHC: 30.2 g/dL (ref 30.0–36.0)
MCV: 91.5 fL (ref 80.0–100.0)
Platelets: 270 10*3/uL (ref 150–400)
RBC: 3.4 MIL/uL — ABNORMAL LOW (ref 3.87–5.11)
RDW: 20.3 % — ABNORMAL HIGH (ref 11.5–15.5)
WBC: 21.3 10*3/uL — ABNORMAL HIGH (ref 4.0–10.5)
nRBC: 0 % (ref 0.0–0.2)

## 2019-04-22 LAB — BASIC METABOLIC PANEL
Anion gap: 6 (ref 5–15)
BUN: 17 mg/dL (ref 8–23)
CO2: 25 mmol/L (ref 22–32)
Calcium: 8.4 mg/dL — ABNORMAL LOW (ref 8.9–10.3)
Chloride: 105 mmol/L (ref 98–111)
Creatinine, Ser: 0.73 mg/dL (ref 0.44–1.00)
GFR calc Af Amer: >60 mL/min (ref >60)
GFR calc non Af Amer: >60 mL/min (ref >60)
Glucose, Bld: 152 mg/dL — ABNORMAL HIGH (ref 70–99)
Potassium: 4.3 mmol/L (ref 3.5–5.1)
Sodium: 136 mmol/L (ref 135–145)

## 2019-04-22 LAB — MAGNESIUM: Magnesium: 2 mg/dL (ref 1.7–2.4)

## 2019-04-22 NOTE — Progress Notes (Signed)
Brief conversation, she did mange to joke with me. Then drift off

## 2019-04-22 NOTE — Progress Notes (Signed)
PROGRESS NOTE    Grace [name]  WLN:[id number]  DOB: [birth year]  DOA: 04/20/2019 PCP: [name], MD Outpatient Specialists:   Hospital course:  Patient is a 69 year old female, previously nurse in the ICU here, with stage IV SCLCa, COPD, nonischemic cardiomyopathy with HFrEF was brought into the ED 04/20/2019 with increasing shortness of breath for 5 days.  In the ED she was noted to be an extremis and declined mechanical ventilation.  She was placed on BiPAP.  CTA showed tiny nonocclusive pulmonary emboli.  Lower extremity ultrasound were negative for DVT.    Subjective:  Patient notes she does feel better with treatment for COPD.  She surprised that she did have a COPD flare as this is unusual for her.  We noted that her lung has significantly less reserve than it did before which might explain why minimal inflammation might cause significant symptoms.  Objective: Vitals:   04/22/19 1359 04/22/19 1400 04/22/19 1500 04/22/19 1600  BP:  105/67 (!) 95/50 95/61  Pulse: 67 61 (!) 59 62  Resp: 14 12 11 11   Temp:  98 F (36.7 C)    TempSrc:  Axillary    SpO2: 95% 92% 92% 91%  Weight:      Height:        Intake/Output Summary (Last 24 hours) at 04/22/2019 1647 Last data filed at 04/22/2019 1400 Gross per 24 hour  Intake 600 ml  Output 450 ml  Net 150 ml   Filed Weights   04/20/19 1227 04/20/19 1813  Weight: 92.5 kg 88.2 kg     Assessment & Plan:   69 year old female, former nurse in the ICU here, with stage IV SCLC presents with acute hypoxic respiratory failure most likely secondary to COPD flare.  Has nonobstructive pulmonary emboli and negative DVT study.  Not on anticoagulation.   Acute on chronic hypoxic respiratory failure Patient feels and looks better status post treatment with prednisone and duo nebs Continue present management of COPD flare complicating stage IV SCLC  Continue oxygen as needed  Pulmonary emboli Nonocclusive, DVT study negative No need  for further anticoagulation at present  Bilateral lower extremity weakness Patient declined lumbar MRI for further work-up She will follow up with her oncologist per her request  Hypokalemia Repleted and normalized  Leukocytosis No evidence for infection at present Follow  HFrEF Continue metoprolol and Entresto Not acutely decompensated at present  Depression Continue Abilify and Cymbalta   DVT prophylaxis: Lovenox Code Status: Partial, DNI Family Communication: Patient states that she is in contact with her daughter Disposition Plan:   Patient is from: Home  Anticipated Discharge Location: Home  Barriers to Discharge: Ongoing hypoxia  Is patient medically stable for Discharge: No   Consultants:  Oncology Dr. Grayland Ormond  Procedures:  None  Antimicrobials:  None   Exam:  General: Patient with loss of hair and barrel chest sitting up in bed, breathing considerably more comfortably than yesterday.  No labored breathing.   Eyes: sclera anicteric, conjuctiva mild injection bilaterally CVS: S1-S2, regular  Respiratory:  decreased air entry bilaterally, no wheezes noted GI: NABS, soft, NT  LE: No edema.  Neuro: A/O x 3, Moving all extremities equally with normal strength, CN 3-12 intact, grossly nonfocal.  Psych: patient is logical and coherent, judgement and insight appear normal, mood and affect seem quite depressed  Data Reviewed: Basic Metabolic Panel: Recent Labs  Lab 04/20/19 1232 04/21/19 0706 04/22/19 0609  NA 139 137 136  K 3.1* 3.8 4.3  CL 102 101 105  CO2 24 24 25   GLUCOSE 110* 228* 152*  BUN 8 13 17   CREATININE 0.67 0.79 0.73  CALCIUM 8.4* 8.8* 8.4*  MG  --   --  2.0   Liver Function Tests: Recent Labs  Lab 04/20/19 1232  AST 75*  ALT 55*  ALKPHOS 82  BILITOT 0.5  PROT 7.3  ALBUMIN 3.4*   Recent Labs  Lab 04/20/19 1232  LIPASE 21   No results for input(s): AMMONIA in the last 168 hours. CBC: Recent Labs  Lab 04/20/19 1232  04/21/19 0706 04/22/19 0609  WBC 10.2 16.7* 21.3*  NEUTROABS 7.0  --   --   HGB 10.6* 9.2* 9.4*  HCT 37.4 30.5* 31.1*  MCV 96.4 91.0 91.5  PLT 262 245 270   Cardiac Enzymes: No results for input(s): CKTOTAL, CKMB, CKMBINDEX, TROPONINI in the last 168 hours. BNP (last 3 results) No results for input(s): PROBNP in the last 8760 hours. CBG: Recent Labs  Lab 04/20/19 1816  GLUCAP 193*    Recent Results (from the past 240 hour(s))  Respiratory Panel by RT PCR (Flu A&B, Covid) - Nasopharyngeal Swab     Status: None   Collection Time: 04/20/19 12:32 PM   Specimen: Nasopharyngeal Swab  Result Value Ref Range Status   SARS Coronavirus 2 by RT PCR NEGATIVE NEGATIVE Final    Comment: (NOTE) SARS-CoV-2 target nucleic acids are NOT DETECTED. The SARS-CoV-2 RNA is generally detectable in upper respiratoy specimens during the acute phase of infection. The lowest concentration of SARS-CoV-2 viral copies this assay can detect is 131 copies/mL. A negative result does not preclude SARS-Cov-2 infection and should not be used as the sole basis for treatment or other patient management decisions. A negative result may occur with  improper specimen collection/handling, submission of specimen other than nasopharyngeal swab, presence of viral mutation(s) within the areas targeted by this assay, and inadequate number of viral copies (<131 copies/mL). A negative result must be combined with clinical observations, patient history, and epidemiological information. The expected result is Negative. Fact Sheet for Patients:  PinkCheek.be Fact Sheet for Healthcare Providers:  GravelBags.it This test is not yet ap proved or cleared by the [place] FDA and  has been authorized for detection and/or diagnosis of SARS-CoV-2 by FDA under an Emergency Use Authorization (EUA). This EUA will remain  in effect (meaning this test can be used) for the  duration of the COVID-19 declaration under Section 564(b)(1) of the Act, 21 U.S.C. section 360bbb-3(b)(1), unless the authorization is terminated or revoked sooner.    Influenza A by PCR NEGATIVE NEGATIVE Final   Influenza B by PCR NEGATIVE NEGATIVE Final    Comment: (NOTE) The Xpert Xpress SARS-CoV-2/FLU/RSV assay is intended as an aid in  the diagnosis of influenza from Nasopharyngeal swab specimens and  should not be used as a sole basis for treatment. Nasal washings and  aspirates are unacceptable for Xpert Xpress SARS-CoV-2/FLU/RSV  testing. Fact Sheet for Patients: PinkCheek.be Fact Sheet for Healthcare Providers: GravelBags.it This test is not yet approved or cleared by the [place] FDA and  has been authorized for detection and/or diagnosis of SARS-CoV-2 by  FDA under an Emergency Use Authorization (EUA). This EUA will remain  in effect (meaning this test can be used) for the duration of the  Covid-19 declaration under Section 564(b)(1) of the Act, 21  U.S.C. section 360bbb-3(b)(1), unless the authorization is  terminated or revoked. Performed at [facility], [place]  [address] [place], [place] [address]   MRSA PCR Screening     Status: None   Collection Time: 04/20/19  6:16 PM   Specimen: Nasal Mucosa; Nasopharyngeal  Result Value Ref Range Status   MRSA by PCR NEGATIVE NEGATIVE Final    Comment:        The GeneXpert MRSA Assay (FDA approved for NASAL specimens only), is one component of a comprehensive MRSA colonization surveillance program. It is not intended to diagnose MRSA infection nor to guide or monitor treatment for MRSA infections. Performed at [facility], [address], [place], [place] [address]       Studies: No results found.   Scheduled Meds: . ARIPiprazole  5 mg Oral Daily  . chlorhexidine  15 mL Mouth Rinse BID  . Chlorhexidine Gluconate Cloth  6 each  Topical Daily  . DULoxetine  60 mg Oral Daily  . enoxaparin (LOVENOX) injection  40 mg Subcutaneous Q24H  . gabapentin  100 mg Oral TID  . ipratropium-albuterol  3 mL Nebulization Q6H  . mouth rinse  15 mL Mouth Rinse q12n4p  . metoprolol succinate  100 mg Oral 1 day or 1 dose  . morphine  30 mg Oral Q8H  . pantoprazole  40 mg Oral Daily  . predniSONE  40 mg Oral Q0600  . 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

## 2019-04-22 NOTE — Progress Notes (Signed)
   04/22/19 1400  Clinical Encounter Type  Visited With Patient  Visit Type Initial  Referral From Nurse  Consult/Referral To Chaplain  Spiritual Encounters  Spiritual Needs Prayer;Emotional;Other (Comment)  [place] received OR to visit pt for end of life care and prayer. This [place] spoke with RN Caryl Pina and was informed that pt has been awake all night for last two days because she was afraid of dying. Pt was feeding herself lunch upon arrival. [facility] stated he would come later.

## 2019-04-22 NOTE — Consult Note (Signed)
[place]  Telephone:([phone] [phone]   Name: Grace [name] Date: 04/22/2019 MRN: [id number]  DOB: 09-21-1950  Patient Care Team: [name], MD as PCP - General (Family Medicine) [name], RN as Registered Nurse Lloyd Huger, MD as Consulting Physician (Oncology)    REASON FOR CONSULTATION: Grace [name] is a 69 y.o. 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  Patient was referred to palliative care to help address goals.  SOCIAL HISTORY:     reports that she quit smoking about 4 years ago. Her smoking use included cigarettes. She has a 25.00 pack-year smoking history. She has never used smokeless tobacco. She reports that she does not drink alcohol or use drugs.   Patient is married and lives at home with her husband and daughter.  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  Patient attends [employer] in [place].  ADVANCE DIRECTIVES:  Not on file but daughter is reportedly [facility] POA  CODE STATUS: Limited code  PAST MEDICAL HISTORY: Past Medical History:  Diagnosis Date  . Anxiety   . Arthritis   . Asthma    COPD  . CAP (community acquired pneumonia) 06/30/2014  . Cardiomyopathy ([place])    nonischemic (EF 35-40%)  . CHF (congestive heart failure) ([place])   . COPD (chronic obstructive pulmonary disease) ([place])   . Depression   . Dyspnea   . GERD (gastroesophageal reflux disease)   . Hyperlipidemia   . Hypertension   . Pneumonia   . PVC's (premature ventricular contractions)    states 10 years ago  . Sepsis ([place]) 06/30/2014  . Small  cell lung cancer ([place]) 12/2016   Chemo tx's.  . SOB (shortness of breath) 05/08/2014  . Spinal cord injury at T7-T12 level [facility])    2016    PAST SURGICAL HISTORY:  Past Surgical History:  Procedure Laterality Date  . APPENDECTOMY    . CARDIAC CATHETERIZATION    . CATARACT EXTRACTION     right eye   . CESAREAN SECTION    . COLONOSCOPY    . LAMINOTOMY    . LUMBAR LAMINECTOMY/DECOMPRESSION MICRODISCECTOMY Left 08/01/2016   Procedure: Left Lumbar Four-Five Laminectomy and Foraminotomy;  Surgeon: Earnie Larsson, MD;  Location: [place];  Service: Neurosurgery;  Laterality: Left;  . PORTA CATH INSERTION N/A 02/03/2017   Procedure: PORTA CATH INSERTION;  Surgeon: Katha Cabal, MD;  Location: [facility];  Service: Cardiovascular;  Laterality: N/A;    HEMATOLOGY/ONCOLOGY HISTORY:  Oncology History Overview Note  Patient with initial diagnosis in November 2018 where she presented for an abdominal ultrasound for mildly elevated liver enzymes.  Imaging revealed multiple lesions in her liver concerning for metastatic disease.  Ordered tumor markers as well as a CT scan of the chest abdomen and pelvis to assess for primary lesion.  Had liver biopsy confirming small cell carcinoma from lung.  PET scan revealed left hilar mass consistent with small cell lung cancer, hypermetabolic mediastinal adenopathy, mass within the left hepatic lobe and skeletal metastasis with nodal and upper abdomen metastasis.  MRI of the brain was negative for metastatic disease.   Patient  received 6 cycles of carboplatin, etoposide, and Tecentriq starting on February 08, 2017 and completing on June 02, 2017.  Patient then received maintenance Tecentriq from Jun 21, 2017 through November 15, 2017.  CT scan results from December 25, 2017 reviewed independently with progressive disease in her mediastinum as well as significant progression of multifocal hepatic disease.  Hospice was briefly discussed, but patient wished to  continue aggressive treatment.  Patient was initially scheduled to get topotecan on days 1 through 5 every 21 days, but given her persistent thrombocytopenia she can only receive treatment every 28 days.  Patient's most recent imaging on October 04, 2018 reviewed independently and reported as above with no new or progressive findings.   Small cell lung cancer ([place])  01/28/2017 Initial Diagnosis   Small cell lung cancer ([place])   01/01/2018 -  Chemotherapy   The patient had topotecan (HYCAMTIN) 3.1 mg in sodium chloride 0.9 % 100 mL chemo infusion, 1.5 mg/m2 = 3.1 mg, Intravenous,  Once, 17 of 18 cycles Dose modification: 1.3 mg/m2 (original dose 1.5 mg/m2, Cycle 2, Reason: Dose not tolerated) Administration: 3.1 mg (01/01/2018), 3.1 mg (01/02/2018), 2.7 mg (01/29/2018), 3.1 mg (01/03/2018), 3.1 mg (01/04/2018), 3.1 mg (01/05/2018), 2.7 mg (01/30/2018), 2.7 mg (01/31/2018), 2.7 mg (02/01/2018), 2.7 mg (02/02/2018), 2.7 mg (02/26/2018), 2.7 mg (02/27/2018), 2.7 mg (02/28/2018), 2.7 mg (03/01/2018), 2.7 mg (03/02/2018), 2.7 mg (03/26/2018), 2.7 mg (03/27/2018), 2.7 mg (03/29/2018), 2.7 mg (03/30/2018), 2.7 mg (04/23/2018), 2.7 mg (04/24/2018), 2.7 mg (04/25/2018), 2.7 mg (04/26/2018), 2.7 mg (05/21/2018), 2.7 mg (05/22/2018), 2.7 mg (05/23/2018), 2.7 mg (05/24/2018), 2.7 mg (05/25/2018), 2.7 mg (06/18/2018), 2.7 mg (06/19/2018), 2.7 mg (06/20/2018), 2.7 mg (06/21/2018), 2.7 mg (06/22/2018), 2.7 mg (07/16/2018), 2.7 mg (07/17/2018), 2.7 mg (07/18/2018), 2.7 mg (07/20/2018), 2.7 mg (08/13/2018), 2.7 mg (08/14/2018), 2.7 mg (08/15/2018), 2.7 mg (08/16/2018), 2.7 mg (09/10/2018), 2.7 mg (09/11/2018), 2.7 mg (09/12/2018), 2.7 mg (09/13/2018), 2.7 mg (09/14/2018), 2.7 mg (10/08/2018), 2.7 mg (10/09/2018), 2.7 mg (10/10/2018), 2.7 mg (10/11/2018), 2.7 mg (10/12/2018), 2.7 mg (11/05/2018), 2.7 mg (11/06/2018), 2.7 mg (11/07/2018), 2.7 mg (11/08/2018), 2.7 mg (11/09/2018), 2.7 mg (12/03/2018), 2.7 mg (12/04/2018), 2.7 mg (12/05/2018), 2.7 mg (12/06/2018), 2.7 mg (12/07/2018), 2.7 mg  (12/31/2018), 2.7 mg (01/01/2019), 2.7 mg (01/02/2019), 2.7 mg (01/03/2019), 2.7 mg (01/04/2019), 2.7 mg (01/28/2019), 2.7 mg (01/29/2019), 2.7 mg (01/30/2019), 2.7 mg (01/31/2019), 2.7 mg (02/01/2019), 2.7 mg (02/25/2019), 2.7 mg (02/26/2019), 2.7 mg (02/27/2019), 2.7 mg (03/26/2019), 2.7 mg (03/27/2019), 2.7 mg (03/29/2019)  for chemotherapy treatment.      ALLERGIES:  is allergic to fish allergy and fentanyl.  MEDICATIONS:  Current Facility-Administered Medications  Medication Dose Route Frequency Provider Last Rate Last Admin  . albuterol (PROVENTIL) (2.5 MG/3ML) 0.083% nebulizer solution 2.5 mg  2.5 mg Nebulization Q2H PRN Bonnell Public Tublu, MD      . ARIPiprazole (ABILIFY) tablet 5 mg  5 mg Oral Daily Agbata, Tochukwu, MD   5 mg at 04/22/19 1042  . chlorhexidine (PERIDEX) 0.12 % solution 15 mL  15 mL Mouth Rinse BID Agbata, Tochukwu, MD      . Chlorhexidine Gluconate Cloth 2 % PADS 6 each  6 each Topical Daily Agbata, Tochukwu, MD   6 each at 04/20/19 1818  . DULoxetine (CYMBALTA) DR capsule 60 mg  60 mg Oral Daily Agbata, Tochukwu, MD   60 mg at 04/22/19 1042  . enoxaparin (LOVENOX) injection 40 mg  40 mg Subcutaneous Q24H Agbata, Tochukwu, MD   40 mg at 04/21/19 2140  . gabapentin (NEURONTIN) capsule  100 mg  100 mg Oral TID Agbata, Tochukwu, MD   100 mg at 04/22/19 1042  . ipratropium-albuterol (DUONEB) 0.5-2.5 (3) MG/3ML nebulizer solution 3 mL  3 mL Nebulization Q6H Bonnell Public Tublu, MD   3 mL at 04/22/19 0801  . LORazepam (ATIVAN) tablet 0.5 mg  0.5 mg Oral PRN Agbata, Tochukwu, MD   0.5 mg at 04/20/19 2240  . MEDLINE mouth rinse  15 mL Mouth Rinse q12n4p Agbata, Tochukwu, MD      . metoprolol succinate (TOPROL-XL) 24 hr tablet 100 mg  100 mg Oral 1 day or 1 dose Agbata, Tochukwu, MD   100 mg at 04/21/19 1751  . morphine (MS CONTIN) 12 hr tablet 30 mg  30 mg Oral Q8H Agbata, Tochukwu, MD   30 mg at 04/22/19 0541  . pantoprazole (PROTONIX) EC tablet 40 mg  40 mg Oral Daily  Agbata, Tochukwu, MD   40 mg at 04/22/19 1042  . predniSONE (DELTASONE) tablet 40 mg  40 mg Oral Q0600 Vashti Hey, MD   40 mg at 04/22/19 0541  . prochlorperazine (COMPAZINE) tablet 10 mg  10 mg Oral Q6H PRN Agbata, Tochukwu, MD      . sacubitril-valsartan (ENTRESTO) 24-26 mg per tablet  1 tablet Oral Daily Agbata, Tochukwu, MD   1 tablet at 04/22/19 1042   Facility-Administered Medications Ordered in Other Encounters  Medication Dose Route Frequency Provider Last Rate Last Admin  . sodium chloride flush (NS) 0.9 % injection 10 mL  10 mL Intravenous PRN Lloyd Huger, MD   10 mL at 03/01/17 0841    VITAL SIGNS: BP (!) 104/59 (BP Location: Right Arm)   Pulse (!) 56   Temp 98.3 F (36.8 C) (Oral)   Resp 10   Ht _0  (1.651 m)   Wt 194 lb 7.1 oz (88.2 kg)   SpO2 97%   BMI 32.36 kg/m  Filed Weights   04/20/19 1227 04/20/19 1813  Weight: 204 lb (92.5 kg) 194 lb 7.1 oz (88.2 kg)    Estimated body mass index is 32.36 kg/m as calculated from the following:   Height as of this encounter: _1  (1.651 m).   Weight as of this encounter: 194 lb 7.1 oz (88.2 kg).  LABS: CBC:    Component Value Date/Time   WBC 21.3 (H) 04/22/2019 0609   HGB 9.4 (L) 04/22/2019 0609   HGB 15.3 02/21/2014 0358   HCT 31.1 (L) 04/22/2019 0609   HCT 45.5 02/21/2014 0358   PLT 270 04/22/2019 0609   PLT 247 02/21/2014 0358   MCV 91.5 04/22/2019 0609   MCV 94 02/21/2014 0358   NEUTROABS 7.0 04/20/2019 1232   NEUTROABS 7.3 (H) 02/21/2014 0358   LYMPHSABS 2.2 04/20/2019 1232   LYMPHSABS 2.7 02/21/2014 0358   MONOABS 0.8 04/20/2019 1232   MONOABS 0.7 02/21/2014 0358   EOSABS 0.1 04/20/2019 1232   EOSABS 0.1 02/21/2014 0358   BASOSABS 0.1 04/20/2019 1232   BASOSABS 0.1 02/21/2014 0358   BASOSABS 0 08/05/2011 1130   Comprehensive Metabolic Panel:    Component Value Date/Time   NA 136 04/22/2019 0609   NA 140 10/11/2016 0941   NA 138 02/21/2014 0358   K 4.3 04/22/2019 0609   K  3.7 02/21/2014 0358   CL 105 04/22/2019 0609   CL 106 02/21/2014 0358   CO2 25 04/22/2019 0609   CO2 21 02/21/2014 0358   BUN 17 04/22/2019 0609   BUN 10 10/11/2016 0941  BUN 11 02/21/2014 0358   CREATININE 0.73 04/22/2019 0609   CREATININE 0.89 02/21/2014 0358   GLUCOSE 152 (H) 04/22/2019 0609   GLUCOSE 121 (H) 02/21/2014 0358   CALCIUM 8.4 (L) 04/22/2019 0609   CALCIUM 9.2 02/21/2014 0358   AST 75 (H) 04/20/2019 1232   AST 29 02/21/2014 0358   ALT 55 (H) 04/20/2019 1232   ALT 38 02/21/2014 0358   ALKPHOS 82 04/20/2019 1232   ALKPHOS 110 02/21/2014 0358   BILITOT 0.5 04/20/2019 1232   BILITOT 0.4 02/21/2014 0358   PROT 7.3 04/20/2019 1232   PROT 7.8 02/21/2014 0358   ALBUMIN 3.4 (L) 04/20/2019 1232   ALBUMIN 4.0 02/21/2014 0358    RADIOGRAPHIC STUDIES: CT Angio Chest PE W and/or Wo Contrast  Result Date: 04/20/2019 CLINICAL DATA:  Shortness of breath. History of lung cancer. Evaluate for pulmonary embolism. EXAM: CT ANGIOGRAPHY CHEST WITH CONTRAST TECHNIQUE: Multidetector CT imaging of the chest was performed using the standard protocol during bolus administration of intravenous contrast. Multiplanar CT image reconstructions and MIPs were obtained to evaluate the vascular anatomy. CONTRAST:  52m OMNIPAQUE IOHEXOL 350 MG/ML SOLN COMPARISON:  Chest radiograph-earlier same day; chest CT-03/22/2019; 12/26/2018; 10/04/2018 FINDINGS: Vascular Findings: There is adequate opacification of the pulmonary arterial system with the main pulmonary artery measuring 329 Hounsfield units. Tiny nonocclusive filling defects within two adjacent medial basilar subsegmental pulmonary arteries (representative images 46 and 47, series 4). Otherwise, no discrete filling defects are seen within the pulmonary arterial tree. Normal caliber the main pulmonary artery. Borderline cardiomegaly.  No pericardial effusion. Scattered atherosclerotic plaque within normal caliber thoracic aorta. Conventional  configuration of the aortic arch. Potential hemodynamically significant narrowing involving the origin the left common carotid artery (image 17, series 4), potentially accentuated due to pulsation artifact Moderate to large amount of slightly irregular atherosclerotic plaque involving the normal caliber descending thoracic aorta, not resulting in a hemodynamically significant stenosis. Review of the MIP images confirms the above findings. ---------------------------------------------------------------------------------- Nonvascular Findings: Mediastinum/Lymph Nodes: No bulky mediastinal, hilar axillary lymphadenopathy. Lungs/Pleura: Grossly unchanged macrolobulated mass within the left upper lobe adjacent to the major fissure measuring approximately 3.2 x 2.2 x 2.1 cm (axial image 25, series 6; coronal image 59, series 7), grossly unchanged compared to the 09/2018 examination and compatible with provided history of known malignancy. Peripheral heterogeneous opacities adjacent to the central mass are unchanged and favored to represent post obstructive atelectasis. Somewhat spiculated ill-defined nodular opacity within the right lower lobe appears similar to the 09/2018 examination, measuring 0.8 cm in greatest short axis diameter (image 38, series 6), as does the approximately 0.8 cm subpleural nodule within the left lower lobe (image 53, series 6). No focal airspace opacities.  No pleural effusion or pneumothorax. Redemonstrated advanced apical predominant centrilobular emphysematous change. Upper abdomen: Known approximately 2.6 x 2.2 cm hepatic metastasis within the subcapsular aspect the medial segment of the left lobe of the liver (image 75, series 4), is grossly unchanged given arterial phase of enhancement. Redemonstrated nodularity hepatic contour. Musculoskeletal: No acute osseous abnormalities. Redemonstrated extensive osseous metastatic disease and vertebral plana involving the T5 vertebral body.  Redemonstrated healed bilateral rib fractures. IMPRESSION: 1. Examination is positive for tiny nonocclusive pulmonary embolism involving two adjacent medial basilar subsegmental pulmonary arteries without evidence of pulmonary infarction or CT evidence of right-sided heart strain. Prior to the initiation of anticoagulation for this extremely tiny volume pulmonary embolism, consideration for the acquisition of bilateral lower extremity venous Doppler ultrasound could be performed as indicated. If  patient has lower extremity DVT, systemic anticoagulation may be warranted. If there is no lower extremity DVT, the risk benefit ratio of initiation of anticoagulating a patient with known stage IV lung cancer should be carefully considered. 2. Otherwise, no acute cardiopulmonary disease. 3. Stable size of known left upper lobe mass with adjacent post obstructive atelectasis. Additional smaller pulmonary nodules are also unchanged. 4. Unchanged hepatic metastasis. 5. Stable sclerotic osseous metastatic disease including T5 vertebral plana deformity. 6. Suspected hemodynamically significant narrowing involving the origin the left common carotid artery. Further evaluation with nonemergent carotid Doppler ultrasound could be performed as indicated. 7. Aortic Atherosclerosis (ICD10-I70.0). Electronically Signed   By: Sandi Mariscal M.D.   On: 04/20/2019 15:25   US Venous Img Lower Bilateral  Result Date: 04/20/2019 CLINICAL DATA:  Leg swelling, PE EXAM: BILATERAL LOWER EXTREMITY VENOUS DOPPLER ULTRASOUND TECHNIQUE: Gray-scale sonography with compression, as well as color and duplex ultrasound, were performed to evaluate the deep venous system(s) from the level of the common femoral vein through the popliteal and proximal calf veins. COMPARISON:  None. FINDINGS: VENOUS Normal compressibility of the common femoral, superficial femoral, and popliteal veins, as well as the visualized calf veins. Visualized portions of profunda  femoral vein and great saphenous vein unremarkable. No filling defects to suggest DVT on grayscale or color Doppler imaging. Doppler waveforms show normal direction of venous flow, normal respiratory phasicity and response to augmentation. OTHER None. Limitations: none IMPRESSION: No femoropopliteal DVT nor evidence of DVT within the visualized calf veins of the bilateral lower extremities. If clinical symptoms are inconsistent or if there are persistent or worsening symptoms, further imaging (possibly involving the iliac veins) may be warranted. Electronically Signed   By: Audie Pinto M.D.   On: 04/20/2019 16:16   DG Chest Portable 1 View  Result Date: 04/20/2019 CLINICAL DATA:  Respiratory distress. EXAM: PORTABLE CHEST 1 VIEW COMPARISON:  08/14/2017 and chest CT 03/22/2019 FINDINGS: Evidence for bilateral old rib fractures with callus formation. There is a nodular structure in the mid right chest which probably represents overlying shadows or old fracture. Heart size is within normal limits and stable. Port-A-Cath tip in the SVC. Atherosclerotic calcifications at the aortic arch. No focal airspace disease or pulmonary edema. Patient has a known lesion in left upper lobe on prior CT but this is not well characterized on this examination. Degenerative changes in the left shoulder. IMPRESSION: 1. No acute cardiopulmonary disease. Known left lung lesion is not well characterized on this chest radiograph. 2. Old bilateral rib fractures. Nodular density in the mid right chest probably represents overlying structures. Electronically Signed   By: Markus Daft M.D.   On: 04/20/2019 13:33   DG PAIN CLINIC C-ARM 1-60 MIN NO REPORT  Result Date: 03/26/2019 Fluoro was used, but no Radiologist interpretation will be provided. Please refer to "NOTES" tab for provider progress note.   PERFORMANCE STATUS (ECOG) : 3 - Symptomatic, >50% confined to bed  Review of Systems Unless otherwise noted, a complete review of  systems is negative.  Physical Exam General: Frail appearing Cardiovascular: regular rate and rhythm Pulmonary: Unlabored Extremities: no edema, no joint deformities Skin: no rashes Neurological: Weakness but otherwise nonfocal  IMPRESSION: I met with patient in the ICU.  Introduced palliative care services and attempted establish therapeutic rapport.  Patient reports that overall she feels like she is slowly improving.  Her breathing is better although she is still at times dyspneic on exertion.  She is not currently requiring BiPAP.  Together, we discussed patient's current medical problems.  Patient says that she expects that she is "near death" due to her cancer.  We had a candid conversation regarding her future care.  Patient says that she is not ruled out more treatment if it will give her some time but that she feels like she is actively declining.  She says that her husband is "in denial" but that her daughter is excepting of her prognosis.  Patient says that she hopes to be at home at end-of-life and that that is a goal that is important to her.  We did discuss the option of hospice care as a method of achieving her goal.  Patient says that she also has the goal of getting her daughter baptized and to purchase head stones for her parents grave, both of which she hopes to complete prior to her passing.  Patient says that she has strong faith.  She asked for me to reach out to her pastor - Durene Fruits at [facility].  However, I was unable to reach him by phone.  I did place a chaplain consult here.  We discussed advance care planning.  Patient says that she has a living will.  She says that her daughter is her healthcare power of attorney.  Patient is currently limited code.  She reiterated to me a desire not to be intubated.  Patient initially said that CPR would be okay but then wavered on that decision when we discussed the probable futility in the setting of  end-stage cancer.  However, patient wanted time to consider decisions.  PLAN: -Continue current scope of treatment -Limited code -Chaplain consult for spiritual support -We will follow  Case and plan discussed with Dr. Grayland Ormond  Time Total: 60 minutes  Visit consisted of counseling and education dealing with the complex and emotionally intense issues of symptom management and palliative care in the setting of serious and potentially life-threatening illness.Greater than 50%  of this time was spent counseling and coordinating care related to the above assessment and plan.  Signed by: Altha Harm, PhD, NP-C

## 2019-04-22 NOTE — Progress Notes (Signed)
   04/22/19 1530  Clinical Encounter Type  Visited With Patient and family together  Visit Type Follow-up  Referral From Nurse  Consult/Referral To Chaplain  Spiritual Encounters  Spiritual Needs Other (Comment)  [place] completed follow up visit. Patient was asleep upon arrival. Pt's daughter [name] was bedside. [place] introduced self to daughter and pt woke up to verbal stimuli. Pt seemed groggy. Pt agreed for follow up visit tomorrow.

## 2019-04-22 NOTE — TOC Initial Note (Signed)
Transition of Care [facility]) - Initial/Assessment Note    Patient Details  Name: Grace [name] MRN: [id number] Date of Birth: 1950/11/01  Transition of Care [facility]) CM/SW Contact:    Magnus Ivan, LCSW Phone Number: 04/22/2019, 2:18 PM  Clinical Narrative:                CSW met with patient at bedside. Explained CSW role. Patient reported she lives with her husband and daughter. Daughter provides transport to and from appointments. PCP is Dr. Chrystine Oiler. Patient reported she uses [facility] on [address] and denied issues with obtaining medications. Patient reported she has a wheelchair, walker, and bedside commode at home. Patient reported she has had home health services in the past, but does not remember which agency she used. Patient reported she has never been to a SNF. Patient reported she would be agreeable to home health services if they are recommended at discharge, but would not want to go to SNF. CSW submitted request for Nurse Secretary to make PCP appointment within 3-5 days of discharge. Patient denied needs at this time and was encouraged to reach out if any needs arise. CSW will continue to follow up.   Expected Discharge Plan: Home/Self Care Barriers to Discharge: Continued Medical Work up   Patient Goals and CMS Choice        Expected Discharge Plan and Services Expected Discharge Plan: Home/Self Care                                              Prior Living Arrangements/Services   Lives with:: Spouse, Adult Children Patient language and need for interpreter reviewed:: Yes Do you feel safe going back to the place where you live?: Yes      Need for Family Participation in Patient Care: Yes (Comment) Care giver support system in place?: Yes (comment) Current home services: DME Criminal Activity/Legal Involvement Pertinent to Current Situation/Hospitalization: No - Comment as needed  Activities of Daily Living      Permission  Sought/Granted                  Emotional Assessment Appearance:: Appears stated age Attitude/Demeanor/Rapport: Engaged Affect (typically observed): Calm, Appropriate Orientation: : Oriented to Self, Oriented to Place, Oriented to  Time, Oriented to Situation Alcohol / Substance Use: Not Applicable Psych Involvement: No (comment)  Admission diagnosis:  COPD exacerbation ([place]) [J44.1] Acute respiratory failure with hypoxia ([place]) [J96.01] 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 Palliative care encounter   . Acute on chronic respiratory failure with hypoxia ([place]) 04/20/2019  . Hypokalemia 04/20/2019  . Transaminitis 04/20/2019  . Pulmonary embolism ([place]) 04/20/2019  . Anemia of chronic disorder 04/20/2019  . Acute respiratory failure with hypoxia ([place]) 04/20/2019  . Chronic systolic CHF (congestive heart failure) ([place]) 04/20/2019  . Unspecified injury at T7-12 level of thoracic spinal cord, sequela ([place]) 04/10/2019  . Chronic lower extremity pain (Left) 03/26/2019  . Lumbar radiculitis (Left) 03/26/2019  . DDD (degenerative disc disease), lumbosacral 03/21/2019  . Chronic low back pain (Bilateral) (L>R) w/ sciatica (Bilateral) (R>L) 03/21/2019  . Abnormal MRI, lumbar spine (03/14/2016) 03/21/2019  . Abnormal MRI, thoracic spine (03/14/2016) 03/21/2019  . Lumbosacral facet arthropathy 03/21/2019  . Chronic acromioclavicular joint pain (Left) 02/25/2019  . Chronic shoulder pain (  Left) 02/25/2019  . Osteoarthritis shoulder (Left) 02/25/2019  . Chronic knee pain (Left) 02/25/2019  . Osteoarthritis of knee (Left) 02/25/2019  . Chronic musculoskeletal pain 12/18/2017  . Arthropathy/Arthralgia of the shoulder (Bilateral) (R>L) 11/28/2017  . COPD (chronic obstructive pulmonary disease) ([place]) 11/07/2017  . Osteoarthritis of acromioclavicular joint (Left) 11/07/2017  . Osteoarthritis of shoulders (Bilateral) 11/07/2017  .  Osteoarthritis of acromioclavicular joints (Bilateral) 11/07/2017  . Osteoarthritis glenohumeral joints (Bilateral) 11/07/2017  . Dehydration 08/14/2017  . Hyponatremia 04/04/2017  . Small cell lung cancer ([place]) 01/28/2017  . Liver mass 01/10/2017  . Chronic acromioclavicular pain (Right) 12/27/2016  . Osteoarthritis of acromioclavicular joint (Right) 12/27/2016  . Osteoarthritis glenohumeral joint (Left) 12/27/2016  . Medial epicondylitis of elbow (Right) 12/27/2016  . Chronic shoulder pain (Bilateral) (R>L) 12/26/2016  . Chronic elbow pain (Right) 12/26/2016  . Long term prescription benzodiazepine use 12/07/2016  . Long term prescription opiate use 12/07/2016  . Opiate use 12/07/2016  . NSAID long-term use 12/07/2016  . Osteoarthritis of knees (Bilateral) 11/22/2016  . Thoracic compression fracture (T4, T5, T6, and T7) 11/22/2016  . Breast cyst 11/21/2016  . Cardiomyopathy ([place]) 11/21/2016  . Diabetes mellitus type 2, uncomplicated ([place]) 66/29/4765  . Fibroids 11/21/2016  . Hidradenitis 11/21/2016  . Nicotine abuse 11/21/2016  . Osteoarthritis cervical spine 11/21/2016  . Osteoarthritis of hands (Bilateral) 11/21/2016  . PUD (peptic ulcer disease) 11/21/2016  . Pulmonary nodule 11/21/2016  . Chronic knee pain (Primary Area of Pain) (Bilateral) 11/21/2016  . Chronic pain syndrome 11/21/2016  . Lumbar stenosis with neurogenic claudication 08/01/2016  . Systolic dysfunction with acute on chronic heart failure ([place])   . CHF (congestive heart failure), NYHA class I ([place]) 08/22/2014  . GERD (gastroesophageal reflux disease) 06/30/2014  . Depression 06/30/2014  . PVC's (premature ventricular contractions) 05/08/2014  . 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 Obesity 05/08/2014  . Tachycardia 05/08/2014  . Congestive dilated cardiomyopathy ([place]) 05/08/2014   PCP:  [name], MD [id number]: [phone]: [phone]    Social Determinants of Health (SDOH) Interventions    Readmission Risk Interventions Readmission Risk Prevention Plan 04/22/2019  Transportation Screening Complete  Medication Review (RCommack Complete  PCP or Specialist appointment within 3-5 days of discharge Complete  HRI or [place]  Some recent data might be hidden

## 2019-04-23 ENCOUNTER — Inpatient Hospital Stay: Payer: Medicare Other

## 2019-04-23 DIAGNOSIS — Z7189 Other specified counseling: Secondary | ICD-10-CM

## 2019-04-23 DIAGNOSIS — C3490 Malignant neoplasm of unspecified part of unspecified bronchus or lung: Secondary | ICD-10-CM

## 2019-04-23 DIAGNOSIS — Z66 Do not resuscitate: Secondary | ICD-10-CM

## 2019-04-23 DIAGNOSIS — I5022 Chronic systolic (congestive) heart failure: Secondary | ICD-10-CM

## 2019-04-23 LAB — BLOOD GAS, ARTERIAL
Acid-Base Excess: 2 mmol/L (ref 0.0–2.0)
Bicarbonate: 26.5 mmol/L (ref 20.0–28.0)
FIO2: 0.28
O2 Saturation: 92.1 %
Patient temperature: 37
pCO2 arterial: 40 mmHg (ref 32.0–48.0)
pH, Arterial: 7.43 (ref 7.350–7.450)
pO2, Arterial: 62 mmHg — ABNORMAL LOW (ref 83.0–108.0)

## 2019-04-23 MED ORDER — GUAIFENESIN ER 600 MG PO TB12
600.0000 mg | ORAL_TABLET | Freq: Two times a day (BID) | ORAL | Status: DC
  Administered 2019-04-23 – 2019-04-24 (×2): 600 mg via ORAL
  Filled 2019-04-23 (×2): qty 1

## 2019-04-23 NOTE — TOC Progression Note (Signed)
Transition of Care [facility]) - Progression Note    Patient Details  Name: Grace [name] MRN: [id number] Date of Birth: 10-Jun-1950  Transition of Care [facility]) CM/SW [place], LCSW Phone Number: 04/23/2019, 4:11 PM  Clinical Narrative:   CSW was informed that patient will discharge home with hospice services and that Santiago Glad with [facility] is aware. CSW reached out to Santiago Glad to follow up on any needs. CSW will continue to follow.    Expected Discharge Plan: Home/Self Care Barriers to Discharge: Continued Medical Work up  Expected Discharge Plan and Services Expected Discharge Plan: Home/Self Care                                               Social Determinants of Health (SDOH) Interventions    Readmission Risk Interventions Readmission Risk Prevention Plan 04/22/2019  Transportation Screening Complete  Medication Review Press photographer) Complete  PCP or Specialist appointment within 3-5 days of discharge Complete  HRI or [place] Complete  Some recent data might be hidden

## 2019-04-23 NOTE — Progress Notes (Signed)
Request for prayer, palliative care and end of life support. Upon arrival Daneshia was able to speak a few words while fading in and out of conversation. She expressed she believes in a loving God and wonders how heaven really works. Chaplain offered words of assurance, blessing and a song. Azura appeared to benefit from these words that encouraged her to lean into her loving God. [name], daughter, was at beside. Grieving as she states is most helpful to her. Wyatt appeared comfortable and peaceful. Both Manika and [name] appear to be understanding and accepting of Zamaya's status. No questions or concerns were expressed regarding end of life progress. [name] is tending her father's needs at home. Spiritual care continues to be available upon request.     04/23/19 1800  Clinical Encounter Type  Visited With Patient and family together  Visit Type Follow-up  Referral From Chaplain  Consult/Referral To [place]

## 2019-04-23 NOTE — Progress Notes (Signed)
Daily Progress Note   Patient Name: Grace [name]       Date: 04/23/2019 DOB: [birth year]  Age: 69 y.o. MRN#: [id number] Attending Physician: Oren Binet* Primary Care Physician: [name], MD Admit Date: 04/20/2019  Reason for Consultation/Follow-up: Establishing goals of care  Subjective: Denies complaints but very sleepy - falls asleep in conversation multiple times.  Length of Stay: 3  Current Medications: Scheduled Meds:  . ARIPiprazole  5 mg Oral Daily  . chlorhexidine  15 mL Mouth Rinse BID  . Chlorhexidine Gluconate Cloth  6 each Topical Daily  . DULoxetine  60 mg Oral Daily  . enoxaparin (LOVENOX) injection  40 mg Subcutaneous Q24H  . gabapentin  100 mg Oral TID  . ipratropium-albuterol  3 mL Nebulization Q6H  . mouth rinse  15 mL Mouth Rinse q12n4p  . metoprolol succinate  100 mg Oral 1 day or 1 dose  . morphine  30 mg Oral Q8H  . pantoprazole  40 mg Oral Daily  . predniSONE  40 mg Oral Q0600  . sacubitril-valsartan  1 tablet Oral Daily    Continuous Infusions:   PRN Meds: albuterol, LORazepam, prochlorperazine  Physical Exam Constitutional:      Comments: Falls asleep during conversation  Pulmonary:     Effort: Pulmonary effort is normal. No accessory muscle usage.  Skin:    General: Skin is warm and dry.  Neurological:     Comments: Oriented with periods of confusion  Psychiatric:        Attention and Perception: She is inattentive.        Speech: Speech is delayed.        Behavior: Behavior is slowed.        Cognition and Memory: Cognition is impaired. Memory is impaired.             Vital Signs: BP (!) 108/56   Pulse 67   Temp 98.3 F (36.8 C) (Axillary)   Resp 16   Ht '5\' 5"'$  (1.651 m)   Wt 88.2 kg   SpO2 94%   BMI 32.36 kg/m  SpO2: SpO2:  94 % O2 Device: O2 Device: Room Air O2 Flow Rate: O2 Flow Rate (L/min): 2 L/min  Intake/output summary:   Intake/Output Summary (Last 24 hours) at 04/23/2019 1253 Last data filed at 04/23/2019 0900 Gross per 24 hour  Intake --  Output 800 ml  Net -800 ml   LBM: Last BM Date: 04/19/19 Baseline Weight: Weight: 92.5 kg Most recent weight: Weight: 88.2 kg       Palliative Assessment/Data: PPS 30%      Patient Active Problem List   Diagnosis Date Noted  . Palliative care encounter   . Acute on chronic respiratory failure with hypoxia ([place]) 04/20/2019  . Hypokalemia 04/20/2019  . Transaminitis 04/20/2019  . Pulmonary embolism ([place]) 04/20/2019  . Anemia of chronic disorder 04/20/2019  . Acute respiratory failure with hypoxia ([place]) 04/20/2019  . Chronic systolic CHF (congestive heart failure) ([place]) 04/20/2019  . Unspecified injury at T7-12 level of thoracic spinal cord, sequela ([place]) 04/10/2019  . Chronic lower extremity pain (Left) 03/26/2019  . Lumbar radiculitis (Left) 03/26/2019  . DDD (degenerative disc disease), lumbosacral 03/21/2019  .  Chronic low back pain (Bilateral) (L>R) w/ sciatica (Bilateral) (R>L) 03/21/2019  . Abnormal MRI, lumbar spine (03/14/2016) 03/21/2019  . Abnormal MRI, thoracic spine (03/14/2016) 03/21/2019  . Lumbosacral facet arthropathy 03/21/2019  . Chronic acromioclavicular joint pain (Left) 02/25/2019  . Chronic shoulder pain (Left) 02/25/2019  . Osteoarthritis shoulder (Left) 02/25/2019  . Chronic knee pain (Left) 02/25/2019  . Osteoarthritis of knee (Left) 02/25/2019  . Chronic musculoskeletal pain 12/18/2017  . Arthropathy/Arthralgia of the shoulder (Bilateral) (R>L) 11/28/2017  . COPD (chronic obstructive pulmonary disease) ([place]) 11/07/2017  . Osteoarthritis of acromioclavicular joint (Left) 11/07/2017  . Osteoarthritis of shoulders (Bilateral) 11/07/2017  . Osteoarthritis of acromioclavicular joints (Bilateral) 11/07/2017  . Osteoarthritis  glenohumeral joints (Bilateral) 11/07/2017  . Dehydration 08/14/2017  . Hyponatremia 04/04/2017  . Small cell lung cancer ([place]) 01/28/2017  . Liver mass 01/10/2017  . Chronic acromioclavicular pain (Right) 12/27/2016  . Osteoarthritis of acromioclavicular joint (Right) 12/27/2016  . Osteoarthritis glenohumeral joint (Left) 12/27/2016  . Medial epicondylitis of elbow (Right) 12/27/2016  . Chronic shoulder pain (Bilateral) (R>L) 12/26/2016  . Chronic elbow pain (Right) 12/26/2016  . Long term prescription benzodiazepine use 12/07/2016  . Long term prescription opiate use 12/07/2016  . Opiate use 12/07/2016  . NSAID long-term use 12/07/2016  . Osteoarthritis of knees (Bilateral) 11/22/2016  . Thoracic compression fracture (T4, T5, T6, and T7) 11/22/2016  . Breast cyst 11/21/2016  . Cardiomyopathy ([place]) 11/21/2016  . Diabetes mellitus type 2, uncomplicated ([place]) 50/10/3816  . Fibroids 11/21/2016  . Hidradenitis 11/21/2016  . Nicotine abuse 11/21/2016  . Osteoarthritis cervical spine 11/21/2016  . Osteoarthritis of hands (Bilateral) 11/21/2016  . PUD (peptic ulcer disease) 11/21/2016  . Pulmonary nodule 11/21/2016  . Chronic knee pain (Primary Area of Pain) (Bilateral) 11/21/2016  . Chronic pain syndrome 11/21/2016  . Lumbar stenosis with neurogenic claudication 08/01/2016  . Systolic dysfunction with acute on chronic heart failure ([place])   . CHF (congestive heart failure), NYHA class I ([place]) 08/22/2014  . GERD (gastroesophageal reflux disease) 06/30/2014  . Depression 06/30/2014  . PVC's (premature ventricular contractions) 05/08/2014  . 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 Obesity 05/08/2014  . Tachycardia 05/08/2014  . Congestive dilated cardiomyopathy ([place]) 05/08/2014    Palliative Care Assessment & Plan   HPI: Grace [name] is a 69 y.o. 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  Patient was referred to palliative care to help address goals.  Assessment: Follow up today with patient after receiving report from Billey Chang, NP.  I first met with patient in ICU - RN reports patient has been having periods of confusion and is groggy. She is being transferred out of ICU today.  Attempted to speak with patient - she falls asleep often during conversation and then needed assistance from nursing staff so conversation was somewhat brief.  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 She shares she is not legally married, though she does have a significant other. She tells me her daughter is her 66.   She denies complaints of pain or shortness of breath.   When I attempted to discuss goals of care Ms. Kondo continuously falls asleep and just continuously states "I'm thinking about it" but then does not  say anything further.   I called daughter [name]. [name] has a good understanding of the situation and serves as patient's caregiver. [name] is concerned that her mother is nearing end of life - I shared my concern of that as well. She tells me that her cognitive decline is very worrisome to her as her mother is typically sharp. She shares about her mother's rapid decline recently.   We discussed the wishes her mother has made known: the desire to be at home and the desire to not be on a ventilator.  We discussed code status and that patient is listed as "partial" code - indicating she would want CPR. We discussed futility of CPR is Ms. Heck's case - [name] agrees and shares that her mother had previously expressed wishes to be DNR. She agrees to code status change to DNR.  We then discussed disposition - [name] knows  her mom wants to be at home and feels that she is able to care for her at home. We discussed option of hospice support at home. [name] is interested in this. We discussed shift of care to focus more on comfort/quality of life instead of aggressive treatment. [name] agrees this may be appropriate - will reevaluate in coming days.   Went back to see patient and [name] later in the day - Megan at bedside feeding patient. Confirmed DNR with patient - she agrees this is appropriate. She tells me she knows her body cannot tolerate continued aggressive treatment and she wants to be at home with [facility]. She is interested in hospice care at home. She also complains of wet, congested cough - will try guaifenesin. Discussed hospice care at home with CSW and hospice liaison.  Recommendations/Plan:  Patient unable to make decisions for herself at this time d/t confusion and lethargy - her daughter [name] is her HCPOA  Code status changed to DNR  Hospice at home  Added guaifenesin  Code Status:  DNR  Prognosis:   weeks-months  Discharge Planning:  To Be Determined - discussing home with hospice  Care plan was discussed with RN, patient, daughter  Thank you for allowing the Palliative Medicine Team to assist in the care of this patient.   Total Time 40 minutes Prolonged Time Billed  no       Greater than 50%  of this time was spent counseling and coordinating care related to the above assessment and plan.  Juel Burrow, DNP, [facility] Palliative Medicine Team Team Phone # [phone]  Pager [phone]

## 2019-04-23 NOTE — Progress Notes (Signed)
New referral for [name] hospice services at home receive from Palliative NP Kathie Rhodes. TOC Grace [name] aware. Patient information sent to referral. Writer met in the room with patient and her daughter Grace [name] to initiate education regarding hospice services, philosophy and team approach to care with understanding voiced.  DME needs discussed, patient will need a hospital bed and oxygen to be in place PRIOR to discharge. Plan is for DME to be delivered tomorrow afternoon after Grace [name] has a chance to arrange space.  Patient will require EMS transport with signed out of facility DNR in place. Hopsice contact information given to [facility]. Patient is requesting a foley catheter to be placed this evening. Staff RN Caryl Pina notified. Will continue to follow through discharge. Thank you for the opportunity to be involved in the care of his patient and her family.

## 2019-04-23 NOTE — Progress Notes (Signed)
PROGRESS NOTE    Grace [name]  GYI:[id number]  DOB: 1950/12/19  DOA: 04/20/2019 PCP: [name], MD Outpatient Specialists:   Hospital course:  Patient is a 69 year old female, previously nurse in the ICU here, with stage IV SCLCa, COPD, nonischemic cardiomyopathy with HFrEF was brought into the ED 04/20/2019 with increasing shortness of breath for 5 days.  In the ED she was noted to be an extremis and declined mechanical ventilation.  She was placed on BiPAP.  CTA showed tiny nonocclusive pulmonary emboli.  Lower extremity ultrasound were negative for DVT.    Subjective:  Patient is sleeping when I going to see her however she wakes up easily but is tired.  She states she is happy to be off the oxygen but she still needed oxygen last night.  She states she still needing as needed albuterol in between the duo nebs.  Objective: Vitals:   04/23/19 0739 04/23/19 0800 04/23/19 0900 04/23/19 1400  BP:  (!) 103/57 (!) 108/56 110/62  Pulse:  71 67   Resp:  11 16 12   Temp:  98.3 F (36.8 C)    TempSrc:  Axillary    SpO2: 93% 94% 94%   Weight:      Height:        Intake/Output Summary (Last 24 hours) at 04/23/2019 1422 Last data filed at 04/23/2019 0900 Gross per 24 hour  Intake --  Output 500 ml  Net -500 ml   Filed Weights   04/20/19 1227 04/20/19 1813  Weight: 92.5 kg 88.2 kg     Assessment & Plan:   69 year old female, former nurse in the ICU here, with stage IV SCLC presents with acute hypoxic respiratory failure most likely secondary to COPD flare.  Has nonobstructive pulmonary emboli and negative DVT study.  Not on anticoagulation.   Acute on chronic hypoxic respiratory failure Patient is off oxygen however appears more sleepy and tired than yesterday.  I:E continues to be prolonged with now rare low pitched wheezes. Will get ABG to check CO2, to see if she would benefit from BiPAP again.   Continue steroids and inhaled bronchodilators and oxygen as  needed.  Stage IV lung CA Patient seen by palliative care earlier today She is considering further options but at this point is continuing with present therapeutic course.  Pulmonary emboli Nonocclusive, DVT study negative No need for further anticoagulation at present  Bilateral lower extremity weakness Patient declined lumbar MRI for further work-up She will follow up with her oncologist per her request  Hypokalemia Repleted and normalized  Leukocytosis No evidence for infection at present Follow  HFrEF Continue metoprolol and Entresto Not acutely decompensated at present  Depression Continue Abilify and Cymbalta   DVT prophylaxis: Lovenox Code Status: Partial, DNI Family Communication: Patient states that she is in contact with her daughter Disposition Plan:   Patient is from: Home  Anticipated Discharge Location: Home  Barriers to Discharge: Ongoing hypoxia  Is patient medically stable for Discharge: No   Consultants:  Oncology Dr. Grayland Ormond  Palliative care, Josh Borders  Procedures:  None  Antimicrobials:  None   Exam:  General: Patient with loss of hair and barrel chest sleeping in sitting position. Eyes: sclera anicteric, conjuctiva clear CVS: S1-S2, regular  Respiratory: Decreased air entry bilaterally, I:E ratio is prolonged 1:3 rare low pitched wheeze. GI: NABS, soft, NT  LE: No edema.  Neuro: A/O x 3, Moving all extremities equally with normal strength, CN 3-12 intact, grossly nonfocal.  Psych:  patient is logical and coherent, judgement and insight appear normal, mood and affect seem quite depressed  Data Reviewed: Basic Metabolic Panel: Recent Labs  Lab 04/20/19 1232 04/21/19 0706 04/22/19 0609  NA 139 137 136  K 3.1* 3.8 4.3  CL 102 101 105  CO2 24 24 25   GLUCOSE 110* 228* 152*  BUN 8 13 17   CREATININE 0.67 0.79 0.73  CALCIUM 8.4* 8.8* 8.4*  MG  --   --  2.0   Liver Function Tests: Recent Labs  Lab 04/20/19 1232  AST  75*  ALT 55*  ALKPHOS 82  BILITOT 0.5  PROT 7.3  ALBUMIN 3.4*   Recent Labs  Lab 04/20/19 1232  LIPASE 21   No results for input(s): AMMONIA in the last 168 hours. CBC: Recent Labs  Lab 04/20/19 1232 04/21/19 0706 04/22/19 0609  WBC 10.2 16.7* 21.3*  NEUTROABS 7.0  --   --   HGB 10.6* 9.2* 9.4*  HCT 37.4 30.5* 31.1*  MCV 96.4 91.0 91.5  PLT 262 245 270   Cardiac Enzymes: No results for input(s): CKTOTAL, CKMB, CKMBINDEX, TROPONINI in the last 168 hours. BNP (last 3 results) No results for input(s): PROBNP in the last 8760 hours. CBG: Recent Labs  Lab 04/20/19 1816  GLUCAP 193*    Recent Results (from the past 240 hour(s))  Respiratory Panel by RT PCR (Flu A&B, Covid) - Nasopharyngeal Swab     Status: None   Collection Time: 04/20/19 12:32 PM   Specimen: Nasopharyngeal Swab  Result Value Ref Range Status   SARS Coronavirus 2 by RT PCR NEGATIVE NEGATIVE Final    Comment: (NOTE) SARS-CoV-2 target nucleic acids are NOT DETECTED. The SARS-CoV-2 RNA is generally detectable in upper respiratoy specimens during the acute phase of infection. The lowest concentration of SARS-CoV-2 viral copies this assay can detect is 131 copies/mL. A negative result does not preclude SARS-Cov-2 infection and should not be used as the sole basis for treatment or other patient management decisions. A negative result may occur with  improper specimen collection/handling, submission of specimen other than nasopharyngeal swab, presence of viral mutation(s) within the areas targeted by this assay, and inadequate number of viral copies (<131 copies/mL). A negative result must be combined with clinical observations, patient history, and epidemiological information. The expected result is Negative. Fact Sheet for Patients:  PinkCheek.be Fact Sheet for Healthcare Providers:  GravelBags.it This test is not yet ap proved or cleared by  the [place] FDA and  has been authorized for detection and/or diagnosis of SARS-CoV-2 by FDA under an Emergency Use Authorization (EUA). This EUA will remain  in effect (meaning this test can be used) for the duration of the COVID-19 declaration under Section 564(b)(1) of the Act, 21 U.S.C. section 360bbb-3(b)(1), unless the authorization is terminated or revoked sooner.    Influenza A by PCR NEGATIVE NEGATIVE Final   Influenza B by PCR NEGATIVE NEGATIVE Final    Comment: (NOTE) The Xpert Xpress SARS-CoV-2/FLU/RSV assay is intended as an aid in  the diagnosis of influenza from Nasopharyngeal swab specimens and  should not be used as a sole basis for treatment. Nasal washings and  aspirates are unacceptable for Xpert Xpress SARS-CoV-2/FLU/RSV  testing. Fact Sheet for Patients: PinkCheek.be Fact Sheet for Healthcare Providers: GravelBags.it This test is not yet approved or cleared by the [place] FDA and  has been authorized for detection and/or diagnosis of SARS-CoV-2 by  FDA under an Emergency Use Authorization (EUA). This EUA will remain  in effect (meaning this test can be used) for the duration of the  Covid-19 declaration under Section 564(b)(1) of the Act, 21  U.S.C. section 360bbb-3(b)(1), unless the authorization is  terminated or revoked. Performed at [facility], [address] [place], [place] [address]   MRSA PCR Screening     Status: None   Collection Time: 04/20/19  6:16 PM   Specimen: Nasal Mucosa; Nasopharyngeal  Result Value Ref Range Status   MRSA by PCR NEGATIVE NEGATIVE Final    Comment:        The GeneXpert MRSA Assay (FDA approved for NASAL specimens only), is one component of a comprehensive MRSA colonization surveillance program. It is not intended to diagnose MRSA infection nor to guide or monitor treatment for MRSA infections. Performed at [facility], [address] [place], [place] [address]       Studies: No results found.   Scheduled Meds: . ARIPiprazole  5 mg Oral Daily  . chlorhexidine  15 mL Mouth Rinse BID  . Chlorhexidine Gluconate Cloth  6 each Topical Daily  . DULoxetine  60 mg Oral Daily  . enoxaparin (LOVENOX) injection  40 mg Subcutaneous Q24H  . gabapentin  100 mg Oral TID  . ipratropium-albuterol  3 mL Nebulization Q6H  . mouth rinse  15 mL Mouth Rinse q12n4p  . metoprolol succinate  100 mg Oral 1 day or 1 dose  . morphine  30 mg Oral Q8H  . pantoprazole  40 mg Oral Daily  . predniSONE  40 mg Oral Q0600  . 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)   Depression   Small cell lung cancer (HCC)   COPD (chronic obstructive pulmonary disease) (HCC)   Hypokalemia   Transaminitis   Pulmonary embolism (HCC)   Anemia of chronic disorder   Acute respiratory failure with hypoxia (HCC)   Chronic systolic CHF (congestive heart failure) ([place])   Palliative care encounter   Small cell lung cancer in adult [facility])   Goals of care, counseling/discussion   DNR (do not resuscitate)     Vashti Hey, [facility]  If 7PM-7AM, please contact night-coverage [email or web address] Password [email or web address] 04/23/2019, 2:22 PM    LOS: 3 days

## 2019-04-23 NOTE — Progress Notes (Signed)
Pt resting comfortably in bed. Was slightly foggy and disoriented in the beginning of the shift, attempting to "drink water" from her blanket and putting her pulse ox in her mouth to check her temperature. Easily reoriented and she was aware that she was feeling off. Pt refused evening gabapentin and morning morphine to avoid being foggy and disoriented. Hospitalist aware of episode, no concerns. Pt did desat to 86-88% early in shift, 2 L [place] applied. Pt now 90-96% on RA.

## 2019-04-24 ENCOUNTER — Inpatient Hospital Stay: Payer: Medicare Other

## 2019-04-24 ENCOUNTER — Telehealth: Payer: Self-pay | Admitting: *Deleted

## 2019-04-24 LAB — BASIC METABOLIC PANEL
Anion gap: 4 — ABNORMAL LOW (ref 5–15)
BUN: 17 mg/dL (ref 8–23)
CO2: 30 mmol/L (ref 22–32)
Calcium: 8.1 mg/dL — ABNORMAL LOW (ref 8.9–10.3)
Chloride: 105 mmol/L (ref 98–111)
Creatinine, Ser: 0.71 mg/dL (ref 0.44–1.00)
GFR calc Af Amer: >60 mL/min (ref >60)
GFR calc non Af Amer: >60 mL/min (ref >60)
Glucose, Bld: 109 mg/dL — ABNORMAL HIGH (ref 70–99)
Potassium: 3.4 mmol/L — ABNORMAL LOW (ref 3.5–5.1)
Sodium: 139 mmol/L (ref 135–145)

## 2019-04-24 MED ORDER — MORPHINE SULFATE (CONCENTRATE) 10 MG/0.5ML PO SOLN: ORAL | 0 refills | Status: DC

## 2019-04-24 MED ORDER — MORPHINE SULFATE (CONCENTRATE) 10 MG/0.5ML PO SOLN
20.0000 mg | Freq: Three times a day (TID) | ORAL | Status: DC
  Administered 2019-04-24 (×2): 20 mg via ORAL
  Filled 2019-04-24: qty 1

## 2019-04-24 MED ORDER — MORPHINE SULFATE (CONCENTRATE) 10 MG/0.5ML PO SOLN
10.0000 mg | ORAL | Status: DC | PRN
  Filled 2019-04-24: qty 1

## 2019-04-24 MED ORDER — GLYCOPYRROLATE 0.2 MG/ML IJ SOLN
0.1000 mg | INTRAMUSCULAR | Status: DC | PRN
  Administered 2019-04-24 (×2): 0.1 mg via INTRAVENOUS
  Filled 2019-04-24 (×2): qty 1

## 2019-04-24 NOTE — Progress Notes (Signed)
Patient discharged to Home with Hospice.

## 2019-04-24 NOTE — TOC Transition Note (Signed)
Transition of Care [facility]) - CM/SW Discharge Note   Patient Details  Name: Grace [name] MRN: [id number] Date of Birth: [birth year]  Transition of Care [facility]) CM/SW Contact:  Magnus Ivan, LCSW Phone Number: 04/24/2019, 3:47 PM   Clinical Narrative:    Patient has orders to discharge home today. Patient to discharge home with Hospice through [place]. [facility] Representative Santiago Glad arranged for a hospital bed to be delivered to the home. CSW completed EMS paperwork and put paperwork with DNR at patient's chart. CSW informed daughter of discharge plan and confirmed the home address. EMS will be called when hospital bed has been delivered to the home. If after 4:30, Santiago Glad with [facility] reported she will call EMS to arrange the transport.     Final next level of care: Home w Hospice Care Barriers to Discharge: Barriers Resolved   Patient Goals and CMS Choice        Discharge Placement                Patient to be transferred to facility by: Non-emergent EMS Name of family member notified: Grace [name] - daughter Patient and family notified of of transfer: 04/24/19  Discharge Plan and Services                DME Arranged: Hospital bed(arranged by Santiago Glad with [facility])                    Social Determinants of Health ([place]) Interventions     Readmission Risk Interventions Readmission Risk Prevention Plan 04/22/2019  Transportation Screening Complete  Medication Review Press photographer) Complete  PCP or Specialist appointment within 3-5 days of discharge Complete  HRI or [place] Complete  Some recent data might be hidden

## 2019-04-24 NOTE — Telephone Encounter (Signed)
Yes , I will

## 2019-04-24 NOTE — Discharge Summary (Signed)
Physician Discharge Summary  TONANTZIN MIMNAUGH CBJ:[id number] DOB: [birth year] DOA: 04/20/2019  PCP: [name], MD  Admit date: 04/20/2019 Discharge date: 04/24/2019  Admitted From: home Disposition:  Home w/ home hospice   Recommendations for Outpatient Follow-up:  1. Follow up with hospice ASAP  Home Health: Equipment/Devices:  Discharge Condition: guarded/hospice CODE STATUS: DNR Diet recommendation: Regular  Brief/Interim Summary: HPI was taken from Dr. Francine Graven: Grace [name] is a 69 y.o. 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  Patient was noted to be significantly tachypneic in the field and was placed on oxygen 4 L via nasal cannula.  She does not wear home oxygen. She was placed on BiPAP in the emergency room and per the emergency room doctor patient not want to be placed on mechanical ventilation in the event that her respiratory status worsens.  During my evaluation she was noted to be on BiPAP and could not participate in the history and physical.  According to the daughter patient's condition has progressively worsened over the last 1 week and has been unable to walk or transfer which she could do previously.  Unable to do review of systems due to patient's mental status   ED Course: Patient with a history of stage IV cell carcinoma of the lung was brought in by EMS for evaluation of shortness of breath is currently on noninvasive mechanical ventilation due to increased work of breathing.  She had a CT diagram of the chest which showed tiny nonocclusive pulmonary emboli.  Radiologist recommended to obtain lower extremity venous Doppler prior to initiation of anticoagulation  Hospital Course from Dr. Lenna Sciara. Grace [name]  04/24/19: Pt was found to have acute on chronic hypoxic respiratory failure secondary to nonobstructive pulmonary emboli. Of note, pt has stage IV lung cancer and palliative care was consulted and pt & pt's family decided to proceed with hospice. Please see previous progress notes for further information. Pt was d/c home w/ hospice.   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 Continue steroids and inhaled bronchodilators   Stage IV lung cancer: decided to proceed w/ hospice.  Pulmonary emboli: nonocclusive, DVT study negative. No need for further anticoagulation at present  Bilateral lower extremity weakness: patient declined lumbar MRI for further work-up. Will follow up with her oncologist per her request  Hypokalemia: KCl repleated. Will continue to monitor   Leukocytosis: reactive vs infection. Will continue to monitor   Chronic systolic CHF: continue metoprolol and entresto. Not acutely decompensated at present  Depression: severity unknown. Continue Abilify and Cymbalta  Discharge Instructions  Discharge Instructions  Diet general   Complete by: As directed    Increase activity slowly   Complete by: As directed      Allergies as of 04/24/2019      Reactions   Fish  Allergy Anaphylaxis   Per pt after eating [facility] she had throat swelling and SOB. MSY  Per pt after eating [facility] she had throat swelling and SOB. MSY    Fentanyl    Overly sedated and groggy, ineffective       Medication List    STOP taking these medications   morphine 30 MG 12 hr tablet Commonly known as: MS CONTIN Replaced by: morphine CONCENTRATE 10 MG/0.5ML Soln concentrated solution   sacubitril-valsartan 24-26 MG Commonly known as: Entresto     TAKE these medications   Advair Diskus 250-50 MCG/DOSE Aepb Generic drug: Fluticasone-Salmeterol Inhale 1 puff into the lungs 2 (two) times daily.   alendronate 70 MG tablet Commonly known as: FOSAMAX Take 70 mg by mouth every Thursday.   ARIPiprazole 5 MG tablet Commonly known as: ABILIFY Take 5 mg by mouth daily.   dextromethorphan-guaiFENesin 30-600 MG 12hr tablet Commonly known as: MUCINEX DM Take 1 tablet by mouth 2 (two) times daily as needed for cough.   DULoxetine 60 MG capsule Commonly known as: CYMBALTA Take 60 mg by mouth daily.   gabapentin 300 MG capsule Commonly known as: NEURONTIN Take 300 mg by mouth 3 (three) times daily.   ipratropium-albuterol 0.5-2.5 (3) MG/3ML Soln Commonly known as: DUONEB Take 3 mLs by nebulization every 4 (four) hours as needed. What changed: reasons to take this   LORazepam 0.5 MG tablet Commonly known as: ATIVAN Take 0.5 mg by mouth as needed for anxiety.   metoprolol succinate 100 MG 24 hr tablet Commonly known as: TOPROL-XL Take 100 mg by mouth daily.   morphine CONCENTRATE 10 MG/0.5ML Soln concentrated solution 20mg /mL Q8H & 10mg  Q2H prn pain &/or shortness of breath Replaces: morphine 30 MG 12 hr tablet   pantoprazole 40 MG tablet Commonly known as: PROTONIX Take 40 mg by mouth daily.   potassium chloride SA 20 MEQ tablet Commonly known as: KLOR-CON Take 1 tablet (20 mEq total) by mouth daily.   ProAir HFA 108 (90 Base) MCG/ACT inhaler Generic drug:  albuterol Inhale 2 puffs into the lungs every 6 (six) hours as needed for wheezing or shortness of breath.   prochlorperazine 10 MG tablet Commonly known as: COMPAZINE TAKE 1 TABLET (10 MG TOTAL) BY MOUTH EVERY 6 (SIX) HOURS AS NEEDED (NAUSEA OR VOMITING). What changed: reasons to take this   promethazine 25 MG tablet Commonly known as: PHENERGAN Take 1 tablet (25 mg total) by mouth every 8 (eight) hours as needed for nausea or vomiting.      Follow-up Information    [name], MD Follow up.   Specialty: Family Medicine Why: Please schedule PCP follow up appointment within 3-5 days of discharge date. Contact information: [place] [address] [phone]          Allergies  Allergen Reactions  . Fish Allergy Anaphylaxis    Per pt after eating [facility] she had throat swelling and SOB. MSY  Per pt after eating [facility] she had throat swelling and SOB. MSY   . Fentanyl     Overly sedated and groggy, ineffective     Consultations:  Palliative care/hospice    Procedures/Studies: CT Angio Chest PE W and/or Wo Contrast  Result Date: 04/20/2019 CLINICAL DATA:  Shortness of breath. History of lung cancer. Evaluate for pulmonary embolism. EXAM: CT ANGIOGRAPHY CHEST WITH CONTRAST TECHNIQUE: Multidetector CT imaging of the chest was performed using the standard protocol during bolus administration of intravenous contrast. Multiplanar CT image reconstructions and MIPs were obtained to evaluate the vascular anatomy. CONTRAST:  83mL OMNIPAQUE IOHEXOL 350 MG/ML SOLN COMPARISON:  Chest radiograph-earlier same day; chest CT-03/22/2019; 12/26/2018; 10/04/2018 FINDINGS: Vascular Findings: There is adequate opacification of the pulmonary arterial system with the main pulmonary artery measuring 329 Hounsfield units. Tiny nonocclusive filling defects within two adjacent medial basilar subsegmental pulmonary arteries (representative images 46 and 47, series 4). Otherwise, no  discrete filling defects are seen within the pulmonary arterial tree. Normal caliber the main pulmonary artery. Borderline cardiomegaly.  No pericardial effusion. Scattered atherosclerotic plaque within normal caliber thoracic aorta. Conventional configuration of the aortic arch. Potential hemodynamically significant narrowing involving the origin the left common carotid artery (image 17, series 4), potentially accentuated due to pulsation artifact Moderate to large amount of slightly irregular atherosclerotic plaque involving the normal caliber descending thoracic aorta, not resulting in a hemodynamically significant stenosis. Review of the MIP images confirms the above findings. ---------------------------------------------------------------------------------- Nonvascular Findings: Mediastinum/Lymph Nodes: No bulky mediastinal, hilar axillary lymphadenopathy. Lungs/Pleura: Grossly unchanged macrolobulated mass within the left upper lobe adjacent to the major fissure measuring approximately 3.2 x 2.2 x 2.1 cm (axial image 25, series 6; coronal image 59, series 7), grossly unchanged compared to the 09/2018 examination and compatible with provided history of known malignancy. Peripheral heterogeneous opacities adjacent to the central mass are unchanged and favored to represent post obstructive atelectasis. Somewhat spiculated ill-defined nodular opacity within the right lower lobe appears similar to the 09/2018 examination, measuring 0.8 cm in greatest short axis diameter (image 38, series 6), as does the approximately 0.8 cm subpleural nodule within the left lower lobe (image 53, series 6). No focal airspace opacities.  No pleural effusion or pneumothorax. Redemonstrated advanced apical predominant centrilobular emphysematous change. Upper abdomen: Known approximately 2.6 x 2.2 cm hepatic metastasis within the subcapsular aspect the medial segment of the left lobe of the liver (image 75, series 4), is grossly  unchanged given arterial phase of enhancement. Redemonstrated nodularity hepatic contour. Musculoskeletal: No acute osseous abnormalities. Redemonstrated extensive osseous metastatic disease and vertebral plana involving the T5 vertebral body. Redemonstrated healed bilateral rib fractures. IMPRESSION: 1. Examination is positive for tiny nonocclusive pulmonary embolism involving two adjacent medial basilar subsegmental pulmonary arteries without evidence of pulmonary infarction or CT evidence of right-sided heart strain. Prior to the initiation of anticoagulation for this extremely tiny volume pulmonary embolism, consideration for the acquisition of bilateral lower extremity venous Doppler ultrasound could be performed as indicated. If patient has lower extremity DVT, systemic anticoagulation may be warranted. If there is no lower extremity DVT, the risk benefit ratio of initiation of anticoagulating a patient with known stage IV lung cancer should be carefully considered. 2. Otherwise, no acute cardiopulmonary disease. 3. Stable size of known left upper lobe mass with adjacent post obstructive atelectasis. Additional smaller pulmonary nodules are also unchanged. 4. Unchanged hepatic metastasis. 5. Stable sclerotic osseous metastatic disease including T5 vertebral plana deformity. 6. Suspected hemodynamically significant narrowing involving the origin the left common carotid artery. Further evaluation with nonemergent carotid Doppler ultrasound could be performed as indicated. 7. Aortic Atherosclerosis (ICD10-I70.0). Electronically Signed   By: Sandi Mariscal M.D.   On: 04/20/2019 15:25   US Venous Img Lower Bilateral  Result Date: 04/20/2019 CLINICAL DATA:  Leg swelling,  PE EXAM: BILATERAL LOWER EXTREMITY VENOUS DOPPLER ULTRASOUND TECHNIQUE: Gray-scale sonography with compression, as well as color and duplex ultrasound, were performed to evaluate the deep venous system(s) from the level of the common femoral vein  through the popliteal and proximal calf veins. COMPARISON:  None. FINDINGS: VENOUS Normal compressibility of the common femoral, superficial femoral, and popliteal veins, as well as the visualized calf veins. Visualized portions of profunda femoral vein and great saphenous vein unremarkable. No filling defects to suggest DVT on grayscale or color Doppler imaging. Doppler waveforms show normal direction of venous flow, normal respiratory phasicity and response to augmentation. OTHER None. Limitations: none IMPRESSION: No femoropopliteal DVT nor evidence of DVT within the visualized calf veins of the bilateral lower extremities. If clinical symptoms are inconsistent or if there are persistent or worsening symptoms, further imaging (possibly involving the iliac veins) may be warranted. Electronically Signed   By: Audie Pinto M.D.   On: 04/20/2019 16:16   DG Chest Portable 1 View  Result Date: 04/20/2019 CLINICAL DATA:  Respiratory distress. EXAM: PORTABLE CHEST 1 VIEW COMPARISON:  08/14/2017 and chest CT 03/22/2019 FINDINGS: Evidence for bilateral old rib fractures with callus formation. There is a nodular structure in the mid right chest which probably represents overlying shadows or old fracture. Heart size is within normal limits and stable. Port-A-Cath tip in the SVC. Atherosclerotic calcifications at the aortic arch. No focal airspace disease or pulmonary edema. Patient has a known lesion in left upper lobe on prior CT but this is not well characterized on this examination. Degenerative changes in the left shoulder. IMPRESSION: 1. No acute cardiopulmonary disease. Known left lung lesion is not well characterized on this chest radiograph. 2. Old bilateral rib fractures. Nodular density in the mid right chest probably represents overlying structures. Electronically Signed   By: Markus Daft M.D.   On: 04/20/2019 13:33   DG PAIN CLINIC C-ARM 1-60 MIN NO REPORT  Result Date: 03/26/2019 Fluoro was used, but  no Radiologist interpretation will be provided. Please refer to "NOTES" tab for provider progress note.      Subjective: Pt c/o shortness of breath   Discharge Exam: Vitals:   04/24/19 1000 04/24/19 1100  BP: 114/76 112/70  Pulse: (!) 106 (!) 103  Resp: 13 13  Temp:    SpO2: 95% 95%   Vitals:   04/24/19 0854 04/24/19 0900 04/24/19 1000 04/24/19 1100  BP:  137/78 114/76 112/70  Pulse: (!) 116 (!) 125 (!) 106 (!) 103  Resp: 12 18 13 13   Temp: 98.9 F (37.2 C)     TempSrc: Axillary     SpO2: 94% 93% 95% 95%  Weight:      Height:        General: Pt is lethargic Cardiovascular: S1/S2 +, no rubs, no gallops Respiratory: diminished breath sounds b/l. No rales  Abdominal: Soft, NT, obese, bowel sounds + Extremities: b/l LE edema, no cyanosis    The results of significant diagnostics from this hospitalization (including imaging, microbiology, ancillary and laboratory) are listed below for reference.     Microbiology: Recent Results (from the past 240 hour(s))  Respiratory Panel by RT PCR (Flu A&B, Covid) - Nasopharyngeal Swab     Status: None   Collection Time: 04/20/19 12:32 PM   Specimen: Nasopharyngeal Swab  Result Value Ref Range Status   SARS Coronavirus 2 by RT PCR NEGATIVE NEGATIVE Final    Comment: (NOTE) SARS-CoV-2 target nucleic acids are NOT DETECTED. The SARS-CoV-2 RNA is generally  detectable in upper respiratoy specimens during the acute phase of infection. The lowest concentration of SARS-CoV-2 viral copies this assay can detect is 131 copies/mL. A negative result does not preclude SARS-Cov-2 infection and should not be used as the sole basis for treatment or other patient management decisions. A negative result may occur with  improper specimen collection/handling, submission of specimen other than nasopharyngeal swab, presence of viral mutation(s) within the areas targeted by this assay, and inadequate number of viral copies (<131 copies/mL). A  negative result must be combined with clinical observations, patient history, and epidemiological information. The expected result is Negative. Fact Sheet for Patients:  PinkCheek.be Fact Sheet for Healthcare Providers:  GravelBags.it This test is not yet ap proved or cleared by the [place] FDA and  has been authorized for detection and/or diagnosis of SARS-CoV-2 by FDA under an Emergency Use Authorization (EUA). This EUA will remain  in effect (meaning this test can be used) for the duration of the COVID-19 declaration under Section 564(b)(1) of the Act, 21 U.S.C. section 360bbb-3(b)(1), unless the authorization is terminated or revoked sooner.    Influenza A by PCR NEGATIVE NEGATIVE Final   Influenza B by PCR NEGATIVE NEGATIVE Final    Comment: (NOTE) The Xpert Xpress SARS-CoV-2/FLU/RSV assay is intended as an aid in  the diagnosis of influenza from Nasopharyngeal swab specimens and  should not be used as a sole basis for treatment. Nasal washings and  aspirates are unacceptable for Xpert Xpress SARS-CoV-2/FLU/RSV  testing. Fact Sheet for Patients: PinkCheek.be Fact Sheet for Healthcare Providers: GravelBags.it This test is not yet approved or cleared by the [place] FDA and  has been authorized for detection and/or diagnosis of SARS-CoV-2 by  FDA under an Emergency Use Authorization (EUA). This EUA will remain  in effect (meaning this test can be used) for the duration of the  Covid-19 declaration under Section 564(b)(1) of the Act, 21  U.S.C. section 360bbb-3(b)(1), unless the authorization is  terminated or revoked. Performed at [facility], [address] [place], [place] [address]   MRSA PCR Screening     Status: None   Collection Time: 04/20/19  6:16 PM   Specimen: Nasal Mucosa; Nasopharyngeal  Result Value Ref Range Status   MRSA by  PCR NEGATIVE NEGATIVE Final    Comment:        The GeneXpert MRSA Assay (FDA approved for NASAL specimens only), is one component of a comprehensive MRSA colonization surveillance program. It is not intended to diagnose MRSA infection nor to guide or monitor treatment for MRSA infections. Performed at [facility], [address] [place], [place] [address]      Labs: BNP (last 3 results) No results for input(s): BNP in the last 8760 hours. Basic Metabolic Panel: Recent Labs  Lab 04/20/19 1232 04/21/19 0706 04/22/19 0609 04/24/19 0402  NA 139 137 136 139  K 3.1* 3.8 4.3 3.4*  CL 102 101 105 105  CO2 24 24 25 30   GLUCOSE 110* 228* 152* 109*  BUN 8 13 17 17   CREATININE 0.67 0.79 0.73 0.71  CALCIUM 8.4* 8.8* 8.4* 8.1*  MG  --   --  2.0  --    Liver Function Tests: Recent Labs  Lab 04/20/19 1232  AST 75*  ALT 55*  ALKPHOS 82  BILITOT 0.5  PROT 7.3  ALBUMIN 3.4*   Recent Labs  Lab 04/20/19 1232  LIPASE 21   No results for input(s): AMMONIA in the last 168  hours. CBC: Recent Labs  Lab 04/20/19 1232 04/21/19 0706 04/22/19 0609  WBC 10.2 16.7* 21.3*  NEUTROABS 7.0  --   --   HGB 10.6* 9.2* 9.4*  HCT 37.4 30.5* 31.1*  MCV 96.4 91.0 91.5  PLT 262 245 270   Cardiac Enzymes: No results for input(s): CKTOTAL, CKMB, CKMBINDEX, TROPONINI in the last 168 hours. BNP: Invalid input(s): POCBNP CBG: Recent Labs  Lab 04/20/19 1816  GLUCAP 193*   D-Dimer No results for input(s): DDIMER in the last 72 hours. Hgb A1c No results for input(s): HGBA1C in the last 72 hours. Lipid Profile No results for input(s): CHOL, HDL, LDLCALC, TRIG, CHOLHDL, LDLDIRECT in the last 72 hours. Thyroid function studies No results for input(s): TSH, T4TOTAL, T3FREE, THYROIDAB in the last 72 hours.  Invalid input(s): FREET3 Anemia work up No results for input(s): VITAMINB12, FOLATE, FERRITIN, TIBC, IRON, RETICCTPCT in the last 72 hours. Urinalysis    Component  Value Date/Time   COLORURINE YELLOW (A) 08/14/2017 1312   APPEARANCEUR HAZY (A) 08/14/2017 1312   APPEARANCEUR Clear 02/21/2014 0510   LABSPEC 1.015 08/14/2017 1312   LABSPEC 1.024 02/21/2014 0510   PHURINE 5.0 08/14/2017 1312   GLUCOSEU NEGATIVE 08/14/2017 1312   GLUCOSEU Negative 02/21/2014 0510   HGBUR NEGATIVE 08/14/2017 1312   BILIRUBINUR NEGATIVE 08/14/2017 1312   BILIRUBINUR Negative 02/21/2014 0510   KETONESUR NEGATIVE 08/14/2017 1312   PROTEINUR NEGATIVE 08/14/2017 1312   NITRITE NEGATIVE 08/14/2017 1312   LEUKOCYTESUR NEGATIVE 08/14/2017 1312   LEUKOCYTESUR Negative 02/21/2014 0510   Sepsis Labs Invalid input(s): PROCALCITONIN,  WBC,  LACTICIDVEN Microbiology Recent Results (from the past 240 hour(s))  Respiratory Panel by RT PCR (Flu A&B, Covid) - Nasopharyngeal Swab     Status: None   Collection Time: 04/20/19 12:32 PM   Specimen: Nasopharyngeal Swab  Result Value Ref Range Status   SARS Coronavirus 2 by RT PCR NEGATIVE NEGATIVE Final    Comment: (NOTE) SARS-CoV-2 target nucleic acids are NOT DETECTED. The SARS-CoV-2 RNA is generally detectable in upper respiratoy specimens during the acute phase of infection. The lowest concentration of SARS-CoV-2 viral copies this assay can detect is 131 copies/mL. A negative result does not preclude SARS-Cov-2 infection and should not be used as the sole basis for treatment or other patient management decisions. A negative result may occur with  improper specimen collection/handling, submission of specimen other than nasopharyngeal swab, presence of viral mutation(s) within the areas targeted by this assay, and inadequate number of viral copies (<131 copies/mL). A negative result must be combined with clinical observations, patient history, and epidemiological information. The expected result is Negative. Fact Sheet for Patients:  PinkCheek.be Fact Sheet for Healthcare Providers:   GravelBags.it This test is not yet ap proved or cleared by the [place] FDA and  has been authorized for detection and/or diagnosis of SARS-CoV-2 by FDA under an Emergency Use Authorization (EUA). This EUA will remain  in effect (meaning this test can be used) for the duration of the COVID-19 declaration under Section 564(b)(1) of the Act, 21 U.S.C. section 360bbb-3(b)(1), unless the authorization is terminated or revoked sooner.    Influenza A by PCR NEGATIVE NEGATIVE Final   Influenza B by PCR NEGATIVE NEGATIVE Final    Comment: (NOTE) The Xpert Xpress SARS-CoV-2/FLU/RSV assay is intended as an aid in  the diagnosis of influenza from Nasopharyngeal swab specimens and  should not be used as a sole basis for treatment. Nasal washings and  aspirates are unacceptable for Xpert  Xpress SARS-CoV-2/FLU/RSV  testing. Fact Sheet for Patients: PinkCheek.be Fact Sheet for Healthcare Providers: GravelBags.it This test is not yet approved or cleared by the [place] FDA and  has been authorized for detection and/or diagnosis of SARS-CoV-2 by  FDA under an Emergency Use Authorization (EUA). This EUA will remain  in effect (meaning this test can be used) for the duration of the  Covid-19 declaration under Section 564(b)(1) of the Act, 21  U.S.C. section 360bbb-3(b)(1), unless the authorization is  terminated or revoked. Performed at [facility], [address] [place], [place] [address]   MRSA PCR Screening     Status: None   Collection Time: 04/20/19  6:16 PM   Specimen: Nasal Mucosa; Nasopharyngeal  Result Value Ref Range Status   MRSA by PCR NEGATIVE NEGATIVE Final    Comment:        The GeneXpert MRSA Assay (FDA approved for NASAL specimens only), is one component of a comprehensive MRSA colonization surveillance program. It is not intended to diagnose MRSA infection nor to  guide or monitor treatment for MRSA infections. Performed at [facility], [address], [place], [place] [address]      Time coordinating discharge: Over 30 minutes  SIGNED:   Wyvonnia Dusky, MD  [facility] 04/24/2019, 11:39 AM Pager   If 7PM-7AM, please contact night-coverage [email or web address]

## 2019-04-24 NOTE — Telephone Encounter (Signed)
Patient discharging from hospital with referral to hospice and they are asking if Dr Grayland Ormond will serve as attending and sign orders. Please advise

## 2019-04-24 NOTE — Progress Notes (Signed)
Follow up visit made to new referral for [name] hospice services at home. Plan continues for patient to discharge home today after the DME is delivered. Her daughter [name] is at home awaiting delivery. Medications discussed with attending physician Dr. Jimmye Norman. New prescription for liquid morphine has been sent to patient's pharmacy, [facility] notified. Hospital care team updated, will continue to follow and provide support. Thank you. Flo Shanks BSN, RN, [place] [phone]

## 2019-04-24 NOTE — Progress Notes (Signed)
Patient just discharged home with EMS.

## 2019-04-24 NOTE — Telephone Encounter (Signed)
VERBAL ORDER called to hospice

## 2019-04-24 NOTE — TOC Progression Note (Signed)
Transition of Care [facility]) - Progression Note    Patient Details  Name: Grace [name] MRN: [id number] Date of Birth: 1950-05-03  Transition of Care [facility]) CM/SW [place], LCSW Phone Number: 04/24/2019, 12:32 PM  Clinical Narrative:   CSW called patient's daughter, [name], regarding plan for patient to discharge home today. Confirmed address for EMS paperwork. [name] reported they are waiting for patient's hospital bed to be delivered. CSW also spoke with Santiago Glad with [facility] regarding plans for patient to discharge today. CSW will call EMS when patient's bed is delivered to the home and when CSW is informed by RN that patient is ready to go.     Expected Discharge Plan: Home/Self Care Barriers to Discharge: Continued Medical Work up  Expected Discharge Plan and Services Expected Discharge Plan: Home/Self Care         Expected Discharge Date: 04/24/19                                     Social Determinants of Health (SDOH) Interventions    Readmission Risk Interventions Readmission Risk Prevention Plan 04/22/2019  Transportation Screening Complete  Medication Review [employer]) Complete  PCP or Specialist appointment within 3-5 days of discharge Complete  HRI or [place] Complete  Some recent data might be hidden

## 2019-04-24 NOTE — Progress Notes (Addendum)
Daily Progress Note   Patient Name: Grace [name]       Date: 04/24/2019 DOB: 01-14-1951  Age: 69 y.o. MRN#: [id number] Attending Physician: Wyvonnia Dusky, MD Primary Care Physician: [name], MD Admit Date: 04/20/2019  Reason for Consultation/Follow-up: Establishing goals of care  Subjective: Denies complaints but very sleepy - falls asleep in conversation multiple times.  Length of Stay: 4  Current Medications: Scheduled Meds:  . ARIPiprazole  5 mg Oral Daily  . DULoxetine  60 mg Oral Daily  . enoxaparin (LOVENOX) injection  40 mg Subcutaneous Q24H  . gabapentin  100 mg Oral TID  . guaiFENesin  600 mg Oral BID  . ipratropium-albuterol  3 mL Nebulization Q6H  . mouth rinse  15 mL Mouth Rinse q12n4p  . metoprolol succinate  100 mg Oral 1 day or 1 dose  . morphine CONCENTRATE  20 mg Oral Q8H  . pantoprazole  40 mg Oral Daily  . predniSONE  40 mg Oral Q0600    Continuous Infusions:   PRN Meds: albuterol, LORazepam, morphine CONCENTRATE, prochlorperazine  Physical Exam Constitutional:      General: She is not in acute distress.    Comments: Sleeping - a few words after stimulated but quickly falls back asleep  Pulmonary:     Effort: Pulmonary effort is normal.  Skin:    General: Skin is warm and dry.  Neurological:     Motor: Weakness present.     Comments: Sleeping mostly, orientation difficult to assess  Psychiatric:        Attention and Perception: She is inattentive.        Speech: Speech is delayed.        Behavior: Behavior is slowed.        Cognition and Memory: Cognition is impaired. Memory is impaired.             Vital Signs: BP 112/70   Pulse (!) 103   Temp 98.9 F (37.2 C) (Axillary)   Resp 13   Ht 5\' 5"  (1.651 m)   Wt 88.2 kg   SpO2 95%   BMI  32.36 kg/m  SpO2: SpO2: 95 % O2 Device: O2 Device: Nasal Cannula O2 Flow Rate: O2 Flow Rate (L/min): 3 L/min  Intake/output summary:   Intake/Output Summary (Last 24 hours) at 04/24/2019 1202 Last data filed at 04/24/2019 1146 Gross per 24 hour  Intake --  Output 800 ml  Net -800 ml   LBM: Last BM Date: 04/19/19 Baseline Weight: Weight: 92.5 kg Most recent weight: Weight: 88.2 kg       Palliative Assessment/Data: PPS 30%    Flowsheet Rows     Most Recent Value  Intake Tab  Referral Department  Oncology  Unit at Time of Referral  ICU  Palliative Care Primary Diagnosis  Cancer  Date Notified  04/22/19  Palliative Care Type  Return patient Palliative Care  Reason for referral  Clarify Goals of Care  Date of Admission  04/20/19  Date first seen by Palliative Care  04/22/19  # of days Palliative referral response time  0 Day(s)  # of days IP prior to Palliative referral  2  Clinical Assessment  Palliative  Performance Scale Score  30%  Psychosocial & Spiritual Assessment  Palliative Care Outcomes  Patient/Family meeting held?  Yes  Who was at the meeting?  patient and daughter  Palliative Care Outcomes  Clarified goals of care, Counseled regarding hospice, Provided psychosocial or spiritual support, Changed CPR status      Patient Active Problem List   Diagnosis Date Noted  . 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 Goals of care, counseling/discussion   . DNR (do not resuscitate)   . Palliative care encounter   . Acute on chronic respiratory failure with hypoxia ([place]) 04/20/2019  . Hypokalemia 04/20/2019  . Transaminitis 04/20/2019  . Pulmonary embolism ([place]) 04/20/2019  . Anemia of chronic disorder 04/20/2019  . Acute respiratory failure with hypoxia ([place]) 04/20/2019  . Chronic systolic CHF (congestive heart failure) ([place]) 04/20/2019  . 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 Chronic lower extremity pain (Left)  03/26/2019  . Lumbar radiculitis (Left) 03/26/2019  . DDD (degenerative disc disease), lumbosacral 03/21/2019  . Chronic low back pain (Bilateral) (L>R) w/ sciatica (Bilateral) (R>L) 03/21/2019  . Abnormal MRI, lumbar spine (03/14/2016) 03/21/2019  . Abnormal MRI, thoracic spine (03/14/2016) 03/21/2019  . Lumbosacral facet arthropathy 03/21/2019  . Chronic acromioclavicular joint pain (Left) 02/25/2019  . Chronic shoulder pain (Left) 02/25/2019  . Osteoarthritis shoulder (Left) 02/25/2019  . Chronic knee pain (Left) 02/25/2019  . Osteoarthritis of knee (Left) 02/25/2019  . Chronic musculoskeletal pain 12/18/2017  . Arthropathy/Arthralgia of the shoulder (Bilateral) (R>L) 11/28/2017  . COPD (chronic obstructive pulmonary disease) ([place]) 11/07/2017  . Osteoarthritis of acromioclavicular joint (Left) 11/07/2017  . Osteoarthritis of shoulders (Bilateral) 11/07/2017  . Osteoarthritis of acromioclavicular joints (Bilateral) 11/07/2017  . Osteoarthritis glenohumeral joints (Bilateral) 11/07/2017  . Dehydration 08/14/2017  . Hyponatremia 04/04/2017  . Small cell lung cancer ([place]) 01/28/2017  . Liver mass 01/10/2017  . Chronic acromioclavicular pain (Right) 12/27/2016  . Osteoarthritis of acromioclavicular joint (Right) 12/27/2016  . Osteoarthritis glenohumeral joint (Left) 12/27/2016  . Medial epicondylitis of elbow (Right) 12/27/2016  . Chronic shoulder pain (Bilateral) (R>L) 12/26/2016  . Chronic elbow pain (Right) 12/26/2016  . Long term prescription benzodiazepine use 12/07/2016  . Long term prescription opiate use 12/07/2016  . Opiate use 12/07/2016  . NSAID long-term use 12/07/2016  . Osteoarthritis of knees (Bilateral) 11/22/2016  . Thoracic compression fracture (T4, T5, T6, and T7) 11/22/2016  . Breast cyst 11/21/2016  . Cardiomyopathy ([place]) 11/21/2016  . Diabetes mellitus type 2, uncomplicated ([place]) 14/43/1540  . Fibroids 11/21/2016  . Hidradenitis 11/21/2016  . Nicotine abuse  11/21/2016  . Osteoarthritis cervical spine 11/21/2016  . Osteoarthritis of hands (Bilateral) 11/21/2016  . PUD (peptic ulcer disease) 11/21/2016  . Pulmonary nodule 11/21/2016  . Chronic knee pain (Primary Area of Pain) (Bilateral) 11/21/2016  . Chronic pain syndrome 11/21/2016  . Lumbar stenosis with neurogenic claudication 08/01/2016  . Systolic dysfunction with acute on chronic heart failure ([place])   . CHF (congestive heart failure), NYHA class I ([place]) 08/22/2014  . GERD (gastroesophageal reflux disease) 06/30/2014  . Depression 06/30/2014  . PVC's (premature ventricular contractions) 05/08/2014  . 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 Obesity 05/08/2014  . Tachycardia 05/08/2014  . Congestive dilated cardiomyopathy ([place]) 05/08/2014    Palliative Care Assessment & Plan   HPI: Grace [name] is a 69 y.o. 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  Patient was referred to palliative care to help address goals.  Assessment: Checked on patient - mental status worse today, sleepier, not as conversant. Does appear comfortable, no labored breathing. Noted medication changes.  Spoke with patient's daughter Grace [name] - reviewed plan of patient coming home with hospice today. Grace [name] agrees to plan. All questions answered. Discussed home medications - discussed patient likely quickly approaching a point where she will be unable to take pills. Discussed use of morphine. Discussed differences between scheduled and PRN dose. Grace [name] expresses understanding.   Called back to patient's bedside later in the day as she was conflicted about going home. Tells me she feels guilty about  "giving up". Spent some time with her for therapeutic listening and emotional support. Discussed limited treatment options. Discussed that she was not "giving up" but dealing with what is happening to her body. After thorough discussion, patient agrees to go home with hospice. Daughter Grace [name] updated on conversation and put on speaker phone to confirm decisions with patient.  Recommendations/Plan:  Patient unable to make decisions for herself at this time d/t confusion and lethargy - her daughter Grace [name] is her HCPOA  Code status changed to DNR  DC home with hospice today  Added PRN morphine in addition to scheduled for breakthrough pain/shortness of breath  Code Status:  DNR  Prognosis:  weeks  Discharge Planning:  Home with [place] was discussed with RN, patient, daughter  Thank you for allowing the Palliative Medicine Team to assist in the care of this patient.   Total Time 1130-1200 1400-1500  90 minutes Prolonged Time Billed  yes      Greater than 50%  of this time was spent counseling and coordinating care related to the above assessment and plan.  Juel Burrow, DNP, [facility] Palliative Medicine Team Team Phone # [phone]  Pager [phone]

## 2019-04-24 NOTE — Progress Notes (Signed)
[place]  [phone] [phone]) [phone]  ID: [name] OB: 1951/01/22  MR#: [id number]  HEN#:[id number]  Patient Care Team: [name], MD as PCP - General (Family Medicine) [name], RN as Registered Nurse [place] Ormond Kathlene November, MD as Consulting Physician (Oncology)  CHIEF COMPLAINT: Stage IV small cell carcinoma, acute on chronic respiratory failure.  INTERVAL HISTORY: Patient continues to have difficulty breathing with persistent oxygen requirement.  Her performance status is slowly declined over the week.  She is lethargic, but arousable.  Plan is to go home on hospice.  REVIEW OF SYSTEMS:   Review of Systems  Constitutional: Positive for malaise/fatigue. Negative for fever and weight loss.  Respiratory: Positive for cough and shortness of breath. Negative for hemoptysis.   Cardiovascular: Negative.  Negative for chest pain and leg swelling.  Gastrointestinal: Negative.  Negative for abdominal pain.  Genitourinary: Negative for dysuria.  Musculoskeletal: Negative.  Negative for back pain.  Skin: Negative.  Negative for rash.  Neurological: Positive for weakness. Negative for dizziness and headaches.  Psychiatric/Behavioral: Negative.  The patient is not nervous/anxious.     As per HPI. Otherwise, a complete review of systems is negative.  PAST MEDICAL HISTORY: Past Medical History:  Diagnosis Date  . Anxiety   . Arthritis   . Asthma    COPD  . CAP (community acquired pneumonia) 06/30/2014  . Cardiomyopathy ([place])    nonischemic (EF 35-40%)  . CHF (congestive heart failure) ([place])   . COPD (chronic obstructive pulmonary disease) ([place])   . Depression   . Dyspnea   . GERD (gastroesophageal reflux disease)   . Hyperlipidemia   . Hypertension   . Pneumonia   . PVC's (premature ventricular contractions)    states 10 years ago  . Sepsis ([place]) 06/30/2014  . Small cell lung cancer ([place]) 12/2016   Chemo tx's.  . SOB  (shortness of breath) 05/08/2014  . Spinal cord injury at T7-T12 level [facility])    2016    PAST SURGICAL HISTORY: Past Surgical History:  Procedure Laterality Date  . APPENDECTOMY    . CARDIAC CATHETERIZATION    . CATARACT EXTRACTION     right eye   . CESAREAN SECTION    . COLONOSCOPY    . LAMINOTOMY    . LUMBAR LAMINECTOMY/DECOMPRESSION MICRODISCECTOMY Left 08/01/2016   Procedure: Left Lumbar Four-Five Laminectomy and Foraminotomy;  Surgeon: Earnie Larsson, MD;  Location: [place];  Service: Neurosurgery;  Laterality: Left;  . PORTA CATH INSERTION N/A 02/03/2017   Procedure: PORTA CATH INSERTION;  Surgeon: Katha Cabal, MD;  Location: [place] [facility];  Service: Cardiovascular;  Laterality: N/A;    FAMILY HISTORY: Family History  Problem Relation Age of Onset  . Heart attack Father 36       MI x 3     ADVANCED DIRECTIVES (Y/N):  @ADVDIR @  HEALTH MAINTENANCE: Social History   Tobacco Use  . Smoking status: Former Smoker    Packs/day: 0.50    Years: 50.00    Pack years: 25.00    Types: Cigarettes    Quit date: 01/27/2015    Years since quitting: 4.2  . Smokeless tobacco: Never Used  Substance Use Topics  . Alcohol use: No  . Drug use: No     Colonoscopy:  PAP:  Bone density:  Lipid panel:  Allergies  Allergen Reactions  . Fish Allergy Anaphylaxis    Per pt after eating [facility] she had throat swelling and SOB. MSY  Per pt after eating [facility] she had throat swelling and SOB. MSY   . Fentanyl     Overly sedated and groggy, ineffective     Current Facility-Administered Medications  Medication Dose Route Frequency Provider Last Rate Last Admin  . albuterol (PROVENTIL) (2.5 MG/3ML) 0.083% nebulizer solution 2.5 mg  2.5 mg Nebulization Q2H PRN Bonnell Public Tublu, MD      . ARIPiprazole (ABILIFY) tablet 5 mg  5 mg Oral Daily Agbata, Tochukwu, MD   5 mg at 04/24/19 0837  . DULoxetine (CYMBALTA) DR capsule 60 mg  60 mg Oral Daily Agbata, Tochukwu, MD    60 mg at 04/24/19 0837  . enoxaparin (LOVENOX) injection 40 mg  40 mg Subcutaneous Q24H Agbata, Tochukwu, MD   40 mg at 04/23/19 2109  . gabapentin (NEURONTIN) capsule 100 mg  100 mg Oral TID Agbata, Tochukwu, MD   100 mg at 04/24/19 0836  . guaiFENesin (MUCINEX) 12 hr tablet 600 mg  600 mg Oral BID Philis Pique, NP   600 mg at 04/24/19 7353  . ipratropium-albuterol (DUONEB) 0.5-2.5 (3) MG/3ML nebulizer solution 3 mL  3 mL Nebulization Q6H Bonnell Public Tublu, MD   3 mL at 04/24/19 1308  . LORazepam (ATIVAN) tablet 0.5 mg  0.5 mg Oral PRN Agbata, Tochukwu, MD   0.5 mg at 04/23/19 0400  . MEDLINE mouth rinse  15 mL Mouth Rinse q12n4p Agbata, Tochukwu, MD      . metoprolol succinate (TOPROL-XL) 24 hr tablet 100 mg  100 mg Oral 1 day or 1 dose Agbata, Tochukwu, MD   100 mg at 04/21/19 1751  . morphine CONCENTRATE 10 MG/0.5ML oral solution 10 mg  10 mg Oral Q2H PRN Philis Pique, NP      . morphine CONCENTRATE 10 MG/0.5ML oral solution 20 mg  20 mg Oral Q8H Sharion Settler, NP   20 mg at 04/24/19 1331  . pantoprazole (PROTONIX) EC tablet 40 mg  40 mg Oral Daily Agbata, Tochukwu, MD   40 mg at 04/24/19 0837  . predniSONE (DELTASONE) tablet 40 mg  40 mg Oral Q0600 Vashti Hey, MD   40 mg at 04/24/19 0629  . prochlorperazine (COMPAZINE) tablet 10 mg  10 mg Oral Q6H PRN Agbata, Tochukwu, MD       Facility-Administered Medications Ordered in Other Encounters  Medication Dose Route Frequency Provider Last Rate Last Admin  . sodium chloride flush (NS) 0.9 % injection 10 mL  10 mL Intravenous PRN Lloyd Huger, MD   10 mL at 03/01/17 0841    OBJECTIVE: Vitals:   04/24/19 1000 04/24/19 1100  BP: 114/76 112/70  Pulse: (!) 106 (!) 103  Resp: 13 13  Temp:    SpO2: 95% 95%     Body mass index is 32.36 kg/m.    ECOG FS:4 - Bedbound  General: Ill-appearing, moderate respiratory distress. Eyes: Pink conjunctiva, anicteric sclera. HEENT: Normocephalic, moist mucous  membranes. Lungs: Diminished breath sounds.   Heart: Regular rate and rhythm. Abdomen: Soft, nontender, no obvious distention. Musculoskeletal: No edema, cyanosis, or clubbing. Neuro: Lethargic, but arousable and answering questions appropriately. Skin: No rashes or petechiae noted. Psych: Normal affect.    LAB RESULTS:  Lab Results  Component Value Date   NA 139 04/24/2019   K 3.4 (L) 04/24/2019   CL 105 04/24/2019   CO2 30 04/24/2019   GLUCOSE 109 (H) 04/24/2019   BUN 17 04/24/2019   CREATININE 0.71 04/24/2019   CALCIUM  8.1 (L) 04/24/2019   PROT 7.3 04/20/2019   ALBUMIN 3.4 (L) 04/20/2019   AST 75 (H) 04/20/2019   ALT 55 (H) 04/20/2019   ALKPHOS 82 04/20/2019   BILITOT 0.5 04/20/2019   GFRNONAA >60 04/24/2019   GFRAA >60 04/24/2019    Lab Results  Component Value Date   WBC 21.3 (H) 04/22/2019   NEUTROABS 7.0 04/20/2019   HGB 9.4 (L) 04/22/2019   HCT 31.1 (L) 04/22/2019   MCV 91.5 04/22/2019   PLT 270 04/22/2019     STUDIES: CT Angio Chest PE W and/or Wo Contrast  Result Date: 04/20/2019 CLINICAL DATA:  Shortness of breath. History of lung cancer. Evaluate for pulmonary embolism. EXAM: CT ANGIOGRAPHY CHEST WITH CONTRAST TECHNIQUE: Multidetector CT imaging of the chest was performed using the standard protocol during bolus administration of intravenous contrast. Multiplanar CT image reconstructions and MIPs were obtained to evaluate the vascular anatomy. CONTRAST:  26mL OMNIPAQUE IOHEXOL 350 MG/ML SOLN COMPARISON:  Chest radiograph-earlier same day; chest CT-03/22/2019; 12/26/2018; 10/04/2018 FINDINGS: Vascular Findings: There is adequate opacification of the pulmonary arterial system with the main pulmonary artery measuring 329 Hounsfield units. Tiny nonocclusive filling defects within two adjacent medial basilar subsegmental pulmonary arteries (representative images 46 and 47, series 4). Otherwise, no discrete filling defects are seen within the pulmonary arterial  tree. Normal caliber the main pulmonary artery. Borderline cardiomegaly.  No pericardial effusion. Scattered atherosclerotic plaque within normal caliber thoracic aorta. Conventional configuration of the aortic arch. Potential hemodynamically significant narrowing involving the origin the left common carotid artery (image 17, series 4), potentially accentuated due to pulsation artifact Moderate to large amount of slightly irregular atherosclerotic plaque involving the normal caliber descending thoracic aorta, not resulting in a hemodynamically significant stenosis. Review of the MIP images confirms the above findings. ---------------------------------------------------------------------------------- Nonvascular Findings: Mediastinum/Lymph Nodes: No bulky mediastinal, hilar axillary lymphadenopathy. Lungs/Pleura: Grossly unchanged macrolobulated mass within the left upper lobe adjacent to the major fissure measuring approximately 3.2 x 2.2 x 2.1 cm (axial image 25, series 6; coronal image 59, series 7), grossly unchanged compared to the 09/2018 examination and compatible with provided history of known malignancy. Peripheral heterogeneous opacities adjacent to the central mass are unchanged and favored to represent post obstructive atelectasis. Somewhat spiculated ill-defined nodular opacity within the right lower lobe appears similar to the 09/2018 examination, measuring 0.8 cm in greatest short axis diameter (image 38, series 6), as does the approximately 0.8 cm subpleural nodule within the left lower lobe (image 53, series 6). No focal airspace opacities.  No pleural effusion or pneumothorax. Redemonstrated advanced apical predominant centrilobular emphysematous change. Upper abdomen: Known approximately 2.6 x 2.2 cm hepatic metastasis within the subcapsular aspect the medial segment of the left lobe of the liver (image 75, series 4), is grossly unchanged given arterial phase of enhancement. Redemonstrated  nodularity hepatic contour. Musculoskeletal: No acute osseous abnormalities. Redemonstrated extensive osseous metastatic disease and vertebral plana involving the T5 vertebral body. Redemonstrated healed bilateral rib fractures. IMPRESSION: 1. Examination is positive for tiny nonocclusive pulmonary embolism involving two adjacent medial basilar subsegmental pulmonary arteries without evidence of pulmonary infarction or CT evidence of right-sided heart strain. Prior to the initiation of anticoagulation for this extremely tiny volume pulmonary embolism, consideration for the acquisition of bilateral lower extremity venous Doppler ultrasound could be performed as indicated. If patient has lower extremity DVT, systemic anticoagulation may be warranted. If there is no lower extremity DVT, the risk benefit ratio of initiation of anticoagulating a patient with known stage  IV lung cancer should be carefully considered. 2. Otherwise, no acute cardiopulmonary disease. 3. Stable size of known left upper lobe mass with adjacent post obstructive atelectasis. Additional smaller pulmonary nodules are also unchanged. 4. Unchanged hepatic metastasis. 5. Stable sclerotic osseous metastatic disease including T5 vertebral plana deformity. 6. Suspected hemodynamically significant narrowing involving the origin the left common carotid artery. Further evaluation with nonemergent carotid Doppler ultrasound could be performed as indicated. 7. Aortic Atherosclerosis (ICD10-I70.0). Electronically Signed   By: Sandi Mariscal M.D.   On: 04/20/2019 15:25   US Venous Img Lower Bilateral  Result Date: 04/20/2019 CLINICAL DATA:  Leg swelling, PE EXAM: BILATERAL LOWER EXTREMITY VENOUS DOPPLER ULTRASOUND TECHNIQUE: Gray-scale sonography with compression, as well as color and duplex ultrasound, were performed to evaluate the deep venous system(s) from the level of the common femoral vein through the popliteal and proximal calf veins. COMPARISON:   None. FINDINGS: VENOUS Normal compressibility of the common femoral, superficial femoral, and popliteal veins, as well as the visualized calf veins. Visualized portions of profunda femoral vein and great saphenous vein unremarkable. No filling defects to suggest DVT on grayscale or color Doppler imaging. Doppler waveforms show normal direction of venous flow, normal respiratory phasicity and response to augmentation. OTHER None. Limitations: none IMPRESSION: No femoropopliteal DVT nor evidence of DVT within the visualized calf veins of the bilateral lower extremities. If clinical symptoms are inconsistent or if there are persistent or worsening symptoms, further imaging (possibly involving the iliac veins) may be warranted. Electronically Signed   By: Audie Pinto M.D.   On: 04/20/2019 16:16   DG Chest Portable 1 View  Result Date: 04/20/2019 CLINICAL DATA:  Respiratory distress. EXAM: PORTABLE CHEST 1 VIEW COMPARISON:  08/14/2017 and chest CT 03/22/2019 FINDINGS: Evidence for bilateral old rib fractures with callus formation. There is a nodular structure in the mid right chest which probably represents overlying shadows or old fracture. Heart size is within normal limits and stable. Port-A-Cath tip in the SVC. Atherosclerotic calcifications at the aortic arch. No focal airspace disease or pulmonary edema. Patient has a known lesion in left upper lobe on prior CT but this is not well characterized on this examination. Degenerative changes in the left shoulder. IMPRESSION: 1. No acute cardiopulmonary disease. Known left lung lesion is not well characterized on this chest radiograph. 2. Old bilateral rib fractures. Nodular density in the mid right chest probably represents overlying structures. Electronically Signed   By: Markus Daft M.D.   On: 04/20/2019 13:33   DG PAIN CLINIC C-ARM 1-60 MIN NO REPORT  Result Date: 03/26/2019 Fluoro was used, but no Radiologist interpretation will be provided. Please refer  to "NOTES" tab for provider progress note.   ASSESSMENT: Stage IV small cell carcinoma, acute on chronic respiratory failure.   PLAN:    1.  Stage IV small cell carcinoma: Patient has agreed to go home with hospice.  No further treatments are planned.  No follow-up is necessary in the cancer center. 2.  Acute on chronic respiratory failure: Patient now declining.  Home with hospice once medical equipment can be delivered. 3.  Pulmonary emboli: Lower extremity ultrasound negative for DVT.  Recommend no anticoagulation at this time. 4.  Lower extremity weakness: Unclear etiology.  Patient does not appear to have any recent imaging/MRI of her lumbar spine.  She reports a recent consultation with her neurosurgeon but no etiology was determined at that time.  Hospice as above.  This patient is DNR/DNI.  Lloyd Huger,  MD   04/24/2019 2:07 PM

## 2019-04-25 ENCOUNTER — Ambulatory Visit: Payer: Medicare Other

## 2019-04-25 ENCOUNTER — Telehealth: Payer: Self-pay | Admitting: *Deleted

## 2019-04-25 NOTE — Telephone Encounter (Signed)
Need specific orders please

## 2019-04-25 NOTE — Telephone Encounter (Signed)
Patient was discharged from hospital with her port accessed per her request to leave it accessed in case hospice needs to use it. Hospice is asking for orders for flushing it if they just flush with NS or use Heparin to flush, how often to flush and change needle. Please advise

## 2019-04-25 NOTE — Telephone Encounter (Signed)
That's fine

## 2019-04-26 ENCOUNTER — Ambulatory Visit: Payer: Medicare Other

## 2019-05-06 ENCOUNTER — Other Ambulatory Visit: Payer: Self-pay | Admitting: *Deleted

## 2019-05-06 MED ORDER — MORPHINE SULFATE (CONCENTRATE) 10 MG/0.5ML PO SOLN: ORAL | 0 refills | Status: AC

## 2019-05-16 DEATH — deceased

## 2019-06-15 DEATH — deceased

## 2019-10-12 IMAGING — CR DG SHOULDER 2+V*L*
1 series · 3 of 3 positions shown · non-contrast
Comparison: None.

CLINICAL DATA: 66-year-old female with chronic shoulder pain
bilaterally. No injury. Initial encounter.

EXAM:
LEFT SHOULDER - 2+ VIEW

[Series 1: dg shoulder left · 0.14mm/px · 3 of 3 slices shown]
[im 1/3]
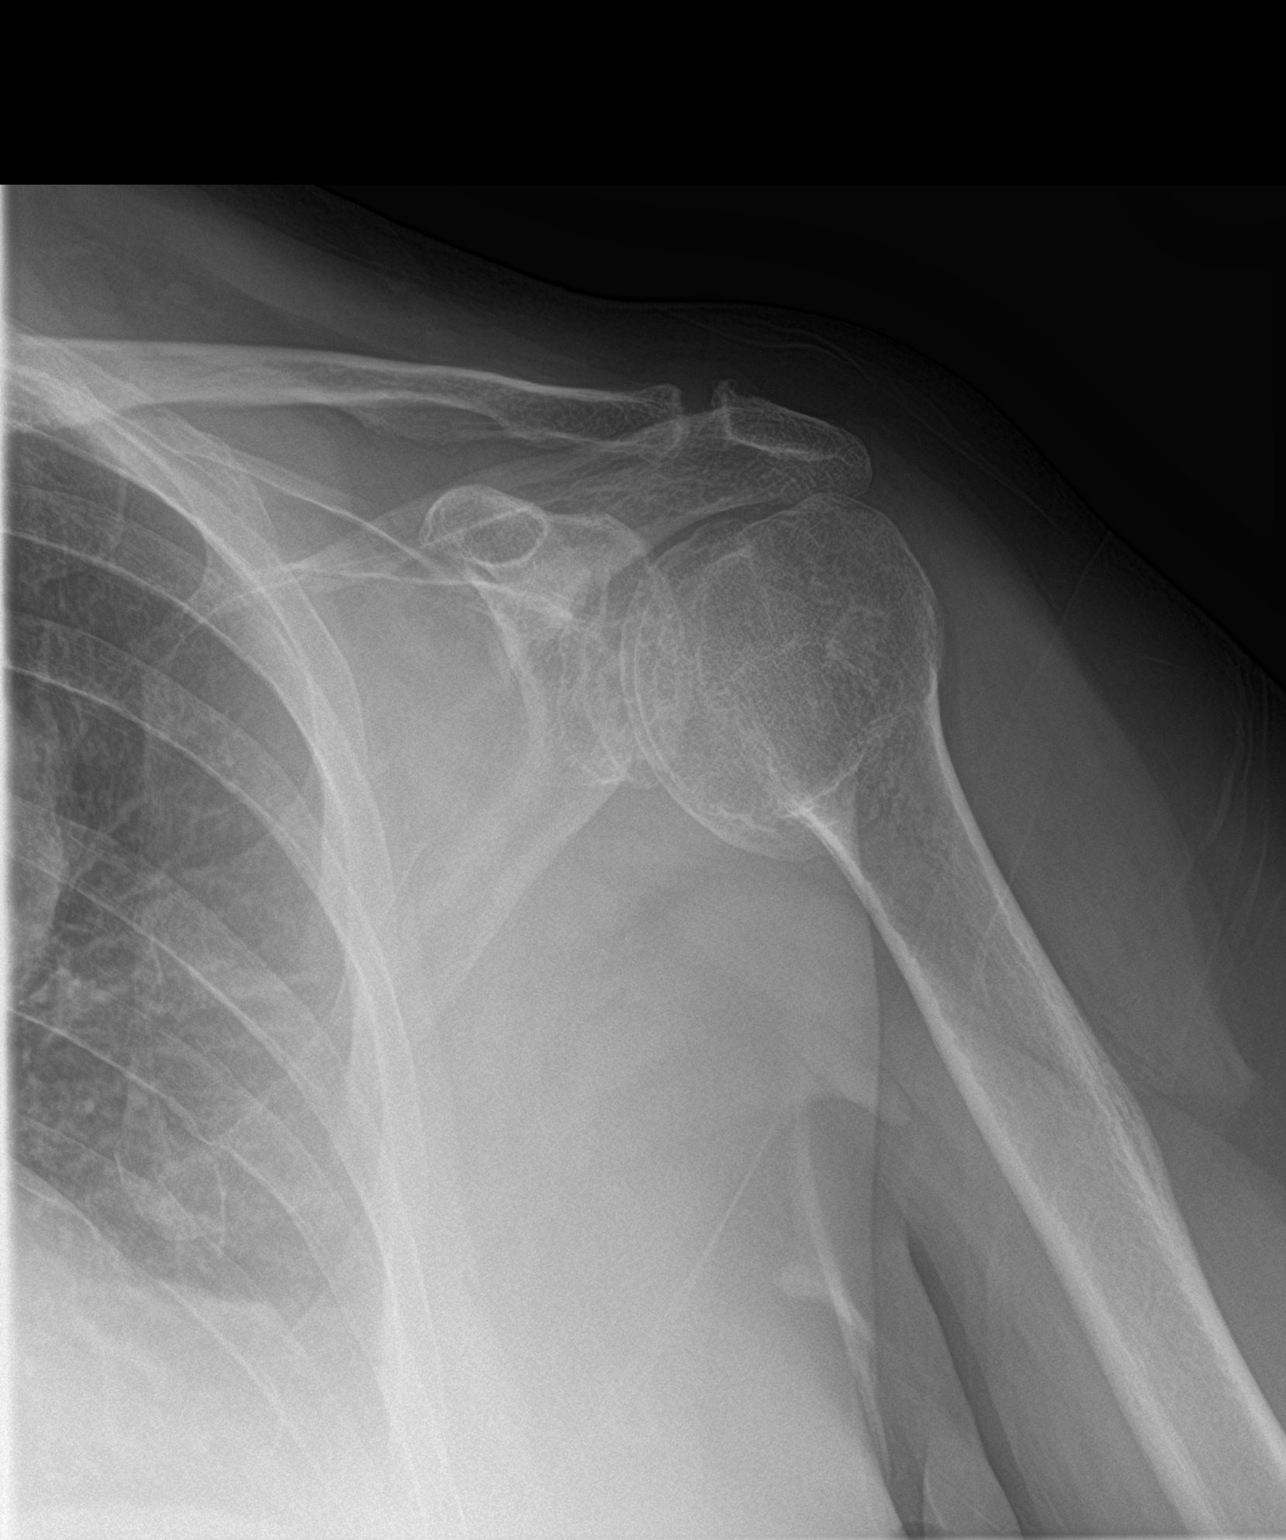
[im 2/3]
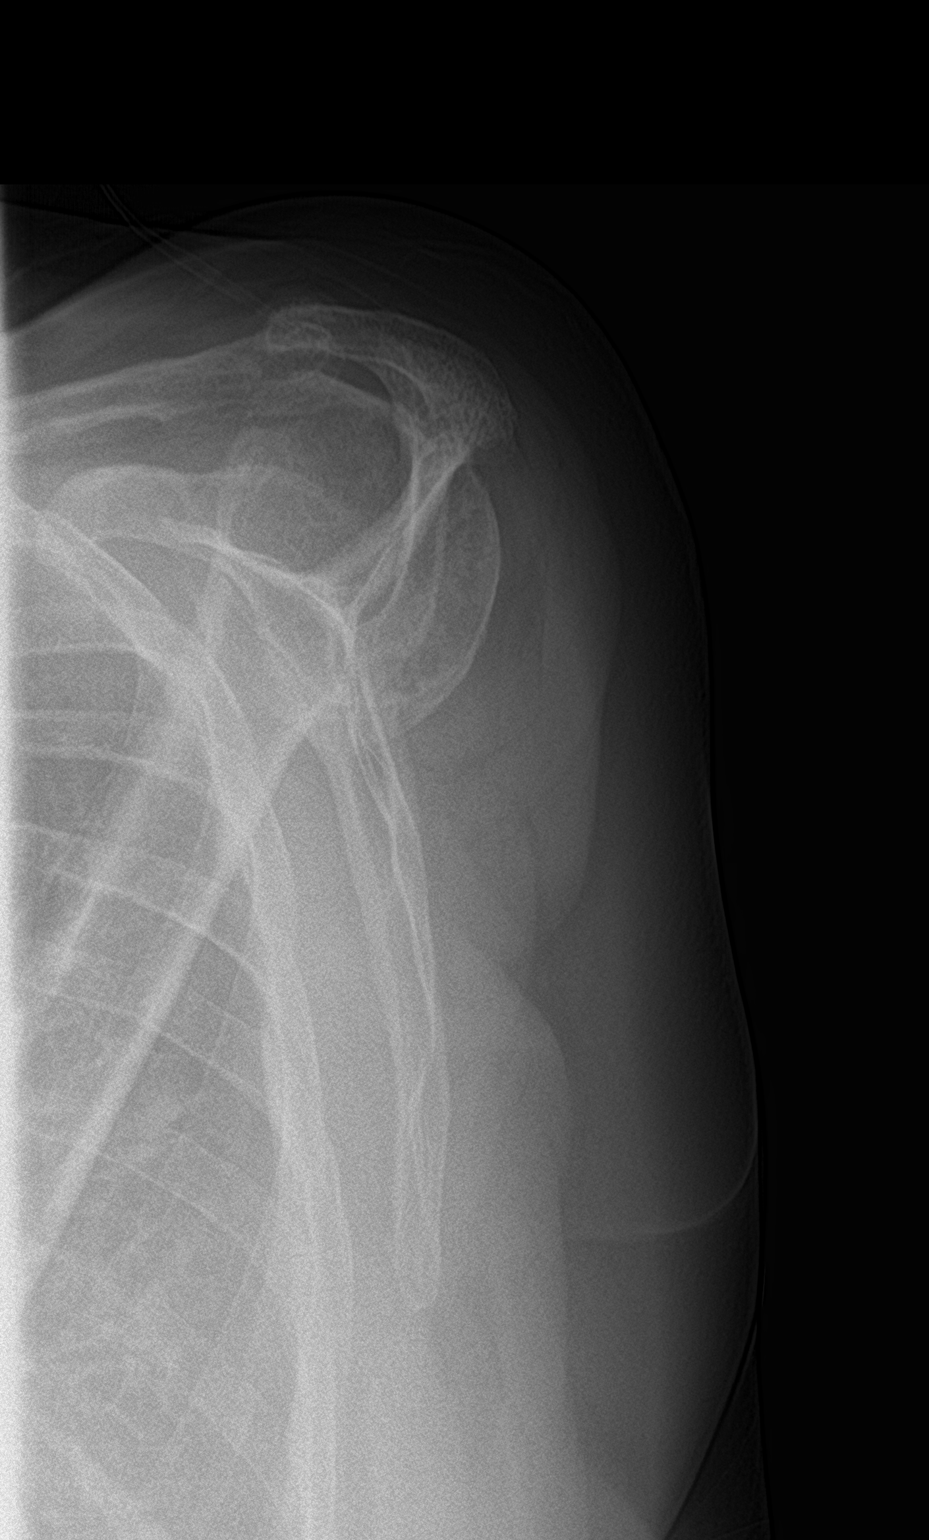
[im 3/3]
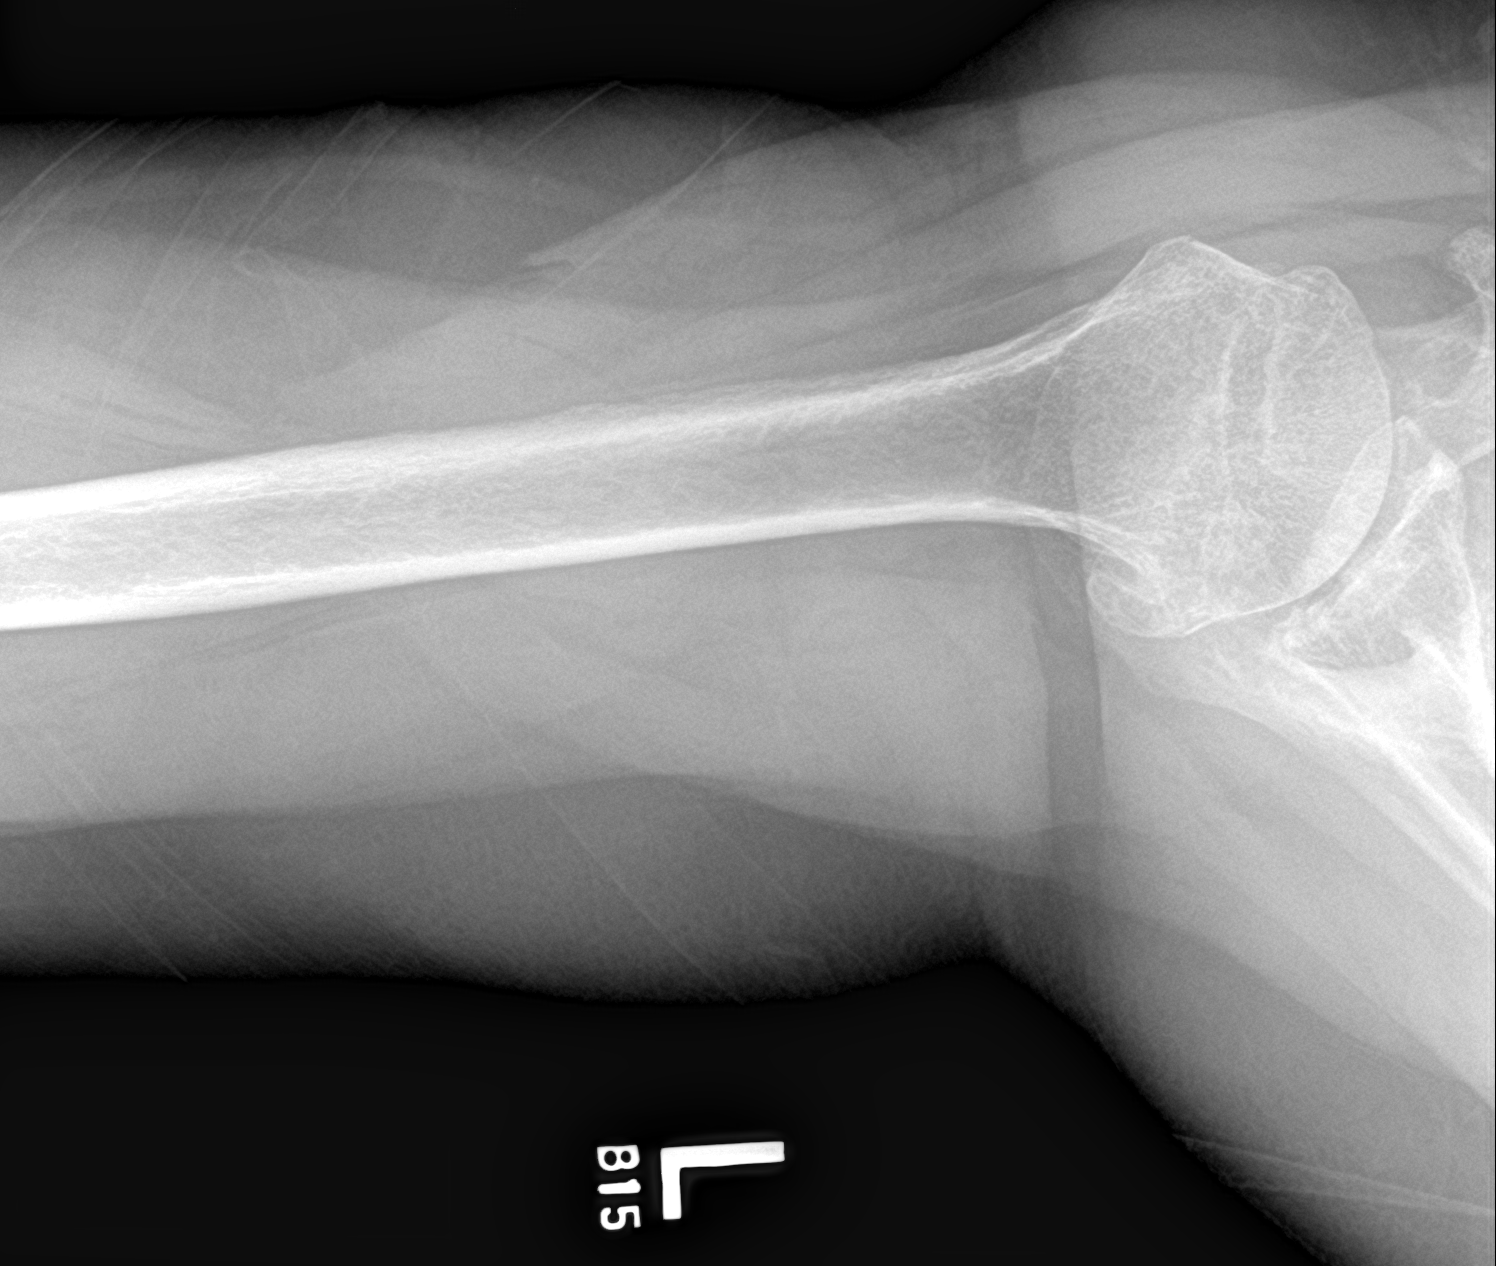

[3 of 3 positions shown; findings below may reference images not displayed]

FINDINGS: Prominent left glenohumeral joint degenerative changes. Question
remote left humeral head fracture. No findings of acute fracture or
dislocation.

Mild acromioclavicular joint degenerative changes.

No abnormal soft tissue calcifications.

Visualized lungs clear.
IMPRESSION: Prominent left glenohumeral joint degenerative changes.

Question remote left humeral head fracture. No findings of acute
fracture or dislocation.

Mild acromioclavicular joint degenerative changes.

## 2019-11-23 IMAGING — PT NM PET TUM IMG INITIAL (PI) SKULL BASE T - THIGH
1 of 9 series · 1 of 25 positions shown · non-contrast
Comparison: CT 02/08/2017

CLINICAL DATA: Initial treatment strategy for lung mass. Small cell
lung cancer.

EXAM:
NUCLEAR MEDICINE PET SKULL BASE TO THIGH
TECHNIQUE: 13.4 mCi F-18 FDG was injected intravenously. Full-ring PET imaging
was performed from the skull base to thigh after the radiotracer. CT
data was obtained and used for attenuation correction and anatomic
localization.
FASTING BLOOD GLUCOSE:  Value: 123 mg/dl

[Series 4: ct wb 5.0 b30f · axial · 5.0mm · 0.98mm/px · 1 of 290 slices shown]
[im 290/290  brain]
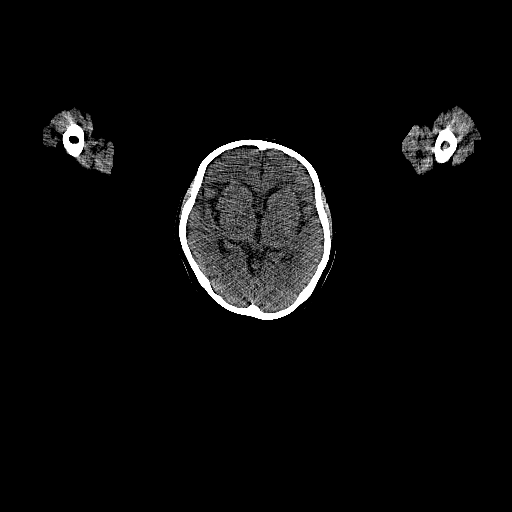

[1 of 25 positions shown; findings below may reference images not displayed]

FINDINGS: NECK

No hypermetabolic lymph nodes in the neck.

CHEST

Port in the RIGHT chest.

Large LEFT parahilar mass measures 6 cm x 6 cm with SUV max equal
12.7. Mass extends into the AP window.

Contralateral hypermetabolic adenopathy in the RIGHT lower
paratracheal nodal station as well subcarinal nodal station.

No hypermetabolic parenchymal pulmonary nodules.

ABDOMEN/PELVIS

Large mass lesion in the central LEFT hepatic lobe measures 7.0 x
10.0 cm with SUV max equal 8.2. Similar expansile lesion in the LEFT
lateral hepatic lobe measures 5.8 cm with SUV max equals 6.7.

Hypermetabolic periportal adenopathy adjacent the pancreatic head.

No pancreatic mass lesion identified.

Normal adrenal glands.  Normal spleen and kidneys

SKELETON:

Several discrete hypermetabolic foci within the skeleton consistent
metastatic disease. For example lesion in the intertrochanteric
RIGHT femur with SUV max equal 4.2. Lesion in the transverse process
of the T10 vertebral body on the RIGHT with SUV max equals 6.0.
IMPRESSION: 1. Hypermetabolic LEFT hilar mass extending into AP window
consistent with small cell lung cancer.
2. Hypermetabolic contralateral mediastinal adenopathy.
3. Large hypermetabolic masses within the LEFT hepatic lobe.
4. Nodal metastasis the upper abdomen at the periportal nodal
station.
5. Several discrete hyper meta skeletal metastasis as above.

## 2020-04-06 ENCOUNTER — Telehealth: Payer: Self-pay | Admitting: Cardiovascular Disease

## 2020-04-06 NOTE — Telephone Encounter (Signed)
3 attempts to schedule fu appt from recall list.   Deleting recall.
